# Patient Record
Sex: Male | Born: 1935 | Race: White | Hispanic: No | Marital: Married | Smoking: Former smoker
Health system: Southern US, Community
[De-identification: ages and names within clinical notes are randomized; demographics above are authoritative.]

## PROBLEM LIST (undated history)

## (undated) DIAGNOSIS — H9319 Tinnitus, unspecified ear: Secondary | ICD-10-CM

## (undated) DIAGNOSIS — H9192 Unspecified hearing loss, left ear: Secondary | ICD-10-CM

## (undated) DIAGNOSIS — I2510 Atherosclerotic heart disease of native coronary artery without angina pectoris: Secondary | ICD-10-CM

## (undated) DIAGNOSIS — I10 Essential (primary) hypertension: Secondary | ICD-10-CM

## (undated) DIAGNOSIS — I471 Supraventricular tachycardia, unspecified: Secondary | ICD-10-CM

## (undated) DIAGNOSIS — I4891 Unspecified atrial fibrillation: Secondary | ICD-10-CM

## (undated) DIAGNOSIS — T4145XA Adverse effect of unspecified anesthetic, initial encounter: Secondary | ICD-10-CM

## (undated) DIAGNOSIS — K219 Gastro-esophageal reflux disease without esophagitis: Secondary | ICD-10-CM

## (undated) DIAGNOSIS — N400 Enlarged prostate without lower urinary tract symptoms: Secondary | ICD-10-CM

## (undated) DIAGNOSIS — M1990 Unspecified osteoarthritis, unspecified site: Secondary | ICD-10-CM

## (undated) DIAGNOSIS — S3500XA Unspecified injury of abdominal aorta, initial encounter: Secondary | ICD-10-CM

## (undated) DIAGNOSIS — I350 Nonrheumatic aortic (valve) stenosis: Secondary | ICD-10-CM

## (undated) DIAGNOSIS — G4730 Sleep apnea, unspecified: Secondary | ICD-10-CM

## (undated) DIAGNOSIS — R9430 Abnormal result of cardiovascular function study, unspecified: Secondary | ICD-10-CM

## (undated) DIAGNOSIS — E785 Hyperlipidemia, unspecified: Secondary | ICD-10-CM

## (undated) DIAGNOSIS — N529 Male erectile dysfunction, unspecified: Secondary | ICD-10-CM

## (undated) DIAGNOSIS — Z8709 Personal history of other diseases of the respiratory system: Secondary | ICD-10-CM

## (undated) DIAGNOSIS — R233 Spontaneous ecchymoses: Secondary | ICD-10-CM

## (undated) DIAGNOSIS — Z8719 Personal history of other diseases of the digestive system: Secondary | ICD-10-CM

## (undated) DIAGNOSIS — Z8619 Personal history of other infectious and parasitic diseases: Secondary | ICD-10-CM

## (undated) DIAGNOSIS — N3281 Overactive bladder: Secondary | ICD-10-CM

## (undated) DIAGNOSIS — R2689 Other abnormalities of gait and mobility: Secondary | ICD-10-CM

## (undated) DIAGNOSIS — R238 Other skin changes: Secondary | ICD-10-CM

## (undated) DIAGNOSIS — Z77090 Contact with and (suspected) exposure to asbestos: Secondary | ICD-10-CM

## (undated) HISTORY — DX: Essential (primary) hypertension: I10

## (undated) HISTORY — DX: Supraventricular tachycardia, unspecified: I47.10

## (undated) HISTORY — DX: Male erectile dysfunction, unspecified: N52.9

## (undated) HISTORY — DX: Unspecified injury of abdominal aorta, initial encounter: S35.00XA

## (undated) HISTORY — DX: Nonrheumatic aortic (valve) stenosis: I35.0

## (undated) HISTORY — DX: Other skin changes: R23.8

## (undated) HISTORY — DX: Atherosclerotic heart disease of native coronary artery without angina pectoris: I25.10

## (undated) HISTORY — DX: Contact with and (suspected) exposure to asbestos: Z77.090

## (undated) HISTORY — DX: Abnormal result of cardiovascular function study, unspecified: R94.30

## (undated) HISTORY — DX: Unspecified atrial fibrillation: I48.91

## (undated) HISTORY — DX: Supraventricular tachycardia: I47.1

## (undated) HISTORY — PX: BRAIN SURGERY: SHX531

## (undated) HISTORY — DX: Gastro-esophageal reflux disease without esophagitis: K21.9

## (undated) HISTORY — DX: Unspecified hearing loss, left ear: H91.92

## (undated) HISTORY — DX: Spontaneous ecchymoses: R23.3

## (undated) HISTORY — DX: Hyperlipidemia, unspecified: E78.5

## (undated) HISTORY — DX: Overactive bladder: N32.81

## (undated) HISTORY — DX: Personal history of other diseases of the respiratory system: Z87.09

## (undated) HISTORY — DX: Unspecified osteoarthritis, unspecified site: M19.90

---

## 1993-12-13 HISTORY — PX: CRANIECTOMY FOR EXCISION OF ACOUSTIC NEUROMA: SUR324

## 1997-03-15 HISTORY — PX: CHOLECYSTECTOMY: SHX55

## 2000-11-27 ENCOUNTER — Ambulatory Visit (HOSPITAL_COMMUNITY): Admission: RE | Admit: 2000-11-27 | Discharge: 2000-11-27 | Payer: Self-pay | Admitting: Internal Medicine

## 2001-10-15 HISTORY — PX: TRIGGER FINGER RELEASE: SHX641

## 2002-01-08 ENCOUNTER — Ambulatory Visit (HOSPITAL_COMMUNITY): Admission: RE | Admit: 2002-01-08 | Discharge: 2002-01-08 | Payer: Self-pay | Admitting: Internal Medicine

## 2002-01-08 ENCOUNTER — Encounter: Payer: Self-pay | Admitting: Internal Medicine

## 2002-01-29 ENCOUNTER — Encounter: Payer: Self-pay | Admitting: Internal Medicine

## 2002-01-29 ENCOUNTER — Ambulatory Visit (HOSPITAL_COMMUNITY): Admission: RE | Admit: 2002-01-29 | Discharge: 2002-01-29 | Payer: Self-pay | Admitting: Internal Medicine

## 2002-04-28 ENCOUNTER — Ambulatory Visit (HOSPITAL_COMMUNITY): Admission: RE | Admit: 2002-04-28 | Discharge: 2002-04-28 | Payer: Self-pay | Admitting: Internal Medicine

## 2003-01-04 ENCOUNTER — Encounter: Admission: RE | Admit: 2003-01-04 | Discharge: 2003-01-04 | Payer: Self-pay | Admitting: Orthopedic Surgery

## 2003-01-04 ENCOUNTER — Encounter: Payer: Self-pay | Admitting: Orthopedic Surgery

## 2003-10-28 ENCOUNTER — Ambulatory Visit (HOSPITAL_COMMUNITY): Admission: RE | Admit: 2003-10-28 | Discharge: 2003-10-28 | Payer: Self-pay | Admitting: Internal Medicine

## 2004-10-20 ENCOUNTER — Ambulatory Visit: Payer: Self-pay | Admitting: Internal Medicine

## 2005-01-24 ENCOUNTER — Ambulatory Visit: Payer: Self-pay | Admitting: Internal Medicine

## 2005-04-30 ENCOUNTER — Ambulatory Visit: Payer: Self-pay | Admitting: Internal Medicine

## 2005-08-08 ENCOUNTER — Ambulatory Visit: Payer: Self-pay | Admitting: Internal Medicine

## 2005-11-06 ENCOUNTER — Ambulatory Visit: Payer: Self-pay | Admitting: Internal Medicine

## 2006-05-06 ENCOUNTER — Ambulatory Visit: Payer: Self-pay | Admitting: Internal Medicine

## 2006-11-06 ENCOUNTER — Ambulatory Visit: Payer: Self-pay | Admitting: Internal Medicine

## 2007-02-13 ENCOUNTER — Encounter (INDEPENDENT_AMBULATORY_CARE_PROVIDER_SITE_OTHER): Payer: Self-pay | Admitting: Internal Medicine

## 2007-03-25 ENCOUNTER — Encounter (INDEPENDENT_AMBULATORY_CARE_PROVIDER_SITE_OTHER): Payer: Self-pay | Admitting: Internal Medicine

## 2007-03-25 LAB — CONVERTED CEMR LAB: PSA: 3.05 ng/mL

## 2007-04-14 ENCOUNTER — Ambulatory Visit: Payer: Self-pay | Admitting: Internal Medicine

## 2007-04-14 DIAGNOSIS — M25569 Pain in unspecified knee: Secondary | ICD-10-CM

## 2007-04-14 DIAGNOSIS — M129 Arthropathy, unspecified: Secondary | ICD-10-CM | POA: Insufficient documentation

## 2007-04-14 DIAGNOSIS — K219 Gastro-esophageal reflux disease without esophagitis: Secondary | ICD-10-CM

## 2007-04-14 DIAGNOSIS — N318 Other neuromuscular dysfunction of bladder: Secondary | ICD-10-CM | POA: Insufficient documentation

## 2007-05-05 ENCOUNTER — Ambulatory Visit: Payer: Self-pay | Admitting: Internal Medicine

## 2007-05-05 ENCOUNTER — Telehealth (INDEPENDENT_AMBULATORY_CARE_PROVIDER_SITE_OTHER): Payer: Self-pay | Admitting: Internal Medicine

## 2007-05-05 LAB — CONVERTED CEMR LAB
BUN: 10 mg/dL (ref 6–23)
Calcium: 8.9 mg/dL (ref 8.4–10.5)
Eosinophils Absolute: 0.2 10*3/uL (ref 0.0–0.6)
GFR calc Af Amer: 123 mL/min
GFR calc non Af Amer: 102 mL/min
Lymphocytes Relative: 27.4 % (ref 12.0–46.0)
MCV: 87 fL (ref 78.0–100.0)
Monocytes Relative: 10.4 % (ref 3.0–11.0)
Neutro Abs: 5.1 10*3/uL (ref 1.4–7.7)
Platelets: 462 10*3/uL — ABNORMAL HIGH (ref 150–400)

## 2007-05-14 ENCOUNTER — Telehealth (INDEPENDENT_AMBULATORY_CARE_PROVIDER_SITE_OTHER): Payer: Self-pay | Admitting: Internal Medicine

## 2007-05-14 ENCOUNTER — Ambulatory Visit: Payer: Self-pay | Admitting: Internal Medicine

## 2007-05-14 ENCOUNTER — Ambulatory Visit (HOSPITAL_COMMUNITY): Admission: RE | Admit: 2007-05-14 | Discharge: 2007-05-14 | Payer: Self-pay | Admitting: Internal Medicine

## 2007-05-15 ENCOUNTER — Telehealth (INDEPENDENT_AMBULATORY_CARE_PROVIDER_SITE_OTHER): Payer: Self-pay | Admitting: *Deleted

## 2007-05-19 ENCOUNTER — Encounter (INDEPENDENT_AMBULATORY_CARE_PROVIDER_SITE_OTHER): Payer: Self-pay | Admitting: Internal Medicine

## 2007-08-04 ENCOUNTER — Ambulatory Visit: Payer: Self-pay | Admitting: Internal Medicine

## 2007-08-04 ENCOUNTER — Telehealth (INDEPENDENT_AMBULATORY_CARE_PROVIDER_SITE_OTHER): Payer: Self-pay | Admitting: Internal Medicine

## 2007-08-05 ENCOUNTER — Telehealth (INDEPENDENT_AMBULATORY_CARE_PROVIDER_SITE_OTHER): Payer: Self-pay | Admitting: *Deleted

## 2007-08-05 LAB — CONVERTED CEMR LAB
ALT: 34 units/L (ref 0–53)
AST: 28 units/L (ref 0–37)
Albumin: 4.3 g/dL (ref 3.5–5.2)
Calcium: 9.4 mg/dL (ref 8.4–10.5)
Chloride: 97 meq/L (ref 96–112)
Potassium: 4.2 meq/L (ref 3.5–5.3)
Sodium: 135 meq/L (ref 135–145)
Total CHOL/HDL Ratio: 6.3

## 2007-08-06 ENCOUNTER — Telehealth (INDEPENDENT_AMBULATORY_CARE_PROVIDER_SITE_OTHER): Payer: Self-pay | Admitting: Internal Medicine

## 2007-10-07 ENCOUNTER — Ambulatory Visit: Payer: Self-pay | Admitting: Cardiology

## 2007-10-07 ENCOUNTER — Encounter (INDEPENDENT_AMBULATORY_CARE_PROVIDER_SITE_OTHER): Payer: Self-pay | Admitting: Internal Medicine

## 2007-10-07 ENCOUNTER — Encounter: Payer: Self-pay | Admitting: Internal Medicine

## 2007-10-08 ENCOUNTER — Encounter (INDEPENDENT_AMBULATORY_CARE_PROVIDER_SITE_OTHER): Payer: Self-pay | Admitting: Internal Medicine

## 2007-10-20 ENCOUNTER — Ambulatory Visit: Payer: Self-pay | Admitting: Internal Medicine

## 2007-10-21 ENCOUNTER — Encounter (INDEPENDENT_AMBULATORY_CARE_PROVIDER_SITE_OTHER): Payer: Self-pay | Admitting: Internal Medicine

## 2007-10-22 ENCOUNTER — Telehealth (INDEPENDENT_AMBULATORY_CARE_PROVIDER_SITE_OTHER): Payer: Self-pay | Admitting: *Deleted

## 2007-10-22 LAB — CONVERTED CEMR LAB: Potassium: 4.4 meq/L (ref 3.5–5.3)

## 2007-10-24 ENCOUNTER — Encounter: Payer: Self-pay | Admitting: Cardiology

## 2007-10-31 ENCOUNTER — Telehealth (INDEPENDENT_AMBULATORY_CARE_PROVIDER_SITE_OTHER): Payer: Self-pay | Admitting: *Deleted

## 2007-11-03 ENCOUNTER — Ambulatory Visit: Payer: Self-pay | Admitting: Internal Medicine

## 2007-11-03 DIAGNOSIS — J4520 Mild intermittent asthma, uncomplicated: Secondary | ICD-10-CM | POA: Insufficient documentation

## 2007-11-05 ENCOUNTER — Ambulatory Visit: Payer: Self-pay | Admitting: Cardiology

## 2007-11-07 ENCOUNTER — Encounter: Payer: Self-pay | Admitting: Physician Assistant

## 2007-11-07 ENCOUNTER — Ambulatory Visit: Payer: Self-pay | Admitting: Cardiology

## 2007-11-18 ENCOUNTER — Ambulatory Visit: Payer: Self-pay | Admitting: Internal Medicine

## 2007-11-18 ENCOUNTER — Telehealth (INDEPENDENT_AMBULATORY_CARE_PROVIDER_SITE_OTHER): Payer: Self-pay | Admitting: *Deleted

## 2007-11-18 DIAGNOSIS — L259 Unspecified contact dermatitis, unspecified cause: Secondary | ICD-10-CM | POA: Insufficient documentation

## 2007-11-19 ENCOUNTER — Telehealth (INDEPENDENT_AMBULATORY_CARE_PROVIDER_SITE_OTHER): Payer: Self-pay | Admitting: Internal Medicine

## 2007-11-20 ENCOUNTER — Ambulatory Visit: Payer: Self-pay | Admitting: Internal Medicine

## 2007-11-28 ENCOUNTER — Ambulatory Visit: Payer: Self-pay | Admitting: Internal Medicine

## 2007-12-16 ENCOUNTER — Ambulatory Visit: Payer: Self-pay | Admitting: Internal Medicine

## 2007-12-17 ENCOUNTER — Telehealth (INDEPENDENT_AMBULATORY_CARE_PROVIDER_SITE_OTHER): Payer: Self-pay | Admitting: *Deleted

## 2007-12-17 LAB — CONVERTED CEMR LAB
CO2: 23 meq/L (ref 19–32)
Calcium: 9.2 mg/dL (ref 8.4–10.5)
Chloride: 104 meq/L (ref 96–112)
Eosinophils Relative: 3 % (ref 0–5)
Glucose, Bld: 103 mg/dL — ABNORMAL HIGH (ref 70–99)
HCT: 43 % (ref 39.0–52.0)
Hemoglobin: 13.9 g/dL (ref 13.0–17.0)
Lymphocytes Relative: 25 % (ref 12–46)
Lymphs Abs: 1.8 10*3/uL (ref 0.7–4.0)
Platelets: 346 10*3/uL (ref 150–400)
Potassium: 4.1 meq/L (ref 3.5–5.3)
Sodium: 139 meq/L (ref 135–145)
WBC: 7.2 10*3/uL (ref 4.0–10.5)

## 2007-12-30 ENCOUNTER — Ambulatory Visit: Payer: Self-pay | Admitting: Cardiology

## 2008-01-15 ENCOUNTER — Telehealth (INDEPENDENT_AMBULATORY_CARE_PROVIDER_SITE_OTHER): Payer: Self-pay | Admitting: *Deleted

## 2008-01-15 ENCOUNTER — Ambulatory Visit: Payer: Self-pay | Admitting: Internal Medicine

## 2008-01-15 DIAGNOSIS — J302 Other seasonal allergic rhinitis: Secondary | ICD-10-CM

## 2008-01-15 DIAGNOSIS — J3089 Other allergic rhinitis: Secondary | ICD-10-CM

## 2008-01-28 ENCOUNTER — Encounter (INDEPENDENT_AMBULATORY_CARE_PROVIDER_SITE_OTHER): Payer: Self-pay | Admitting: Internal Medicine

## 2008-02-10 ENCOUNTER — Ambulatory Visit: Payer: Self-pay | Admitting: Cardiology

## 2008-02-12 ENCOUNTER — Ambulatory Visit: Payer: Self-pay | Admitting: Internal Medicine

## 2008-02-13 ENCOUNTER — Encounter (INDEPENDENT_AMBULATORY_CARE_PROVIDER_SITE_OTHER): Payer: Self-pay | Admitting: Internal Medicine

## 2008-02-16 ENCOUNTER — Encounter (INDEPENDENT_AMBULATORY_CARE_PROVIDER_SITE_OTHER): Payer: Self-pay | Admitting: Internal Medicine

## 2008-02-21 ENCOUNTER — Encounter (INDEPENDENT_AMBULATORY_CARE_PROVIDER_SITE_OTHER): Payer: Self-pay | Admitting: Internal Medicine

## 2008-02-23 ENCOUNTER — Telehealth (INDEPENDENT_AMBULATORY_CARE_PROVIDER_SITE_OTHER): Payer: Self-pay | Admitting: Internal Medicine

## 2008-02-24 ENCOUNTER — Encounter (INDEPENDENT_AMBULATORY_CARE_PROVIDER_SITE_OTHER): Payer: Self-pay | Admitting: Internal Medicine

## 2008-02-24 ENCOUNTER — Ambulatory Visit (HOSPITAL_COMMUNITY): Payer: Self-pay | Admitting: Psychology

## 2008-02-24 ENCOUNTER — Telehealth (INDEPENDENT_AMBULATORY_CARE_PROVIDER_SITE_OTHER): Payer: Self-pay | Admitting: *Deleted

## 2008-03-02 ENCOUNTER — Encounter: Payer: Self-pay | Admitting: Internal Medicine

## 2008-03-02 ENCOUNTER — Ambulatory Visit: Admission: RE | Admit: 2008-03-02 | Discharge: 2008-03-02 | Payer: Self-pay | Admitting: Emergency Medicine

## 2008-03-04 ENCOUNTER — Ambulatory Visit: Payer: Self-pay | Admitting: Pulmonary Disease

## 2008-03-12 ENCOUNTER — Ambulatory Visit (HOSPITAL_COMMUNITY): Payer: Self-pay | Admitting: Psychology

## 2008-03-18 ENCOUNTER — Ambulatory Visit: Payer: Self-pay | Admitting: Cardiology

## 2008-03-18 ENCOUNTER — Encounter (INDEPENDENT_AMBULATORY_CARE_PROVIDER_SITE_OTHER): Payer: Self-pay | Admitting: Internal Medicine

## 2008-03-25 ENCOUNTER — Ambulatory Visit: Payer: Self-pay | Admitting: Internal Medicine

## 2008-03-25 DIAGNOSIS — G4733 Obstructive sleep apnea (adult) (pediatric): Secondary | ICD-10-CM

## 2008-03-30 ENCOUNTER — Ambulatory Visit: Payer: Self-pay | Admitting: Internal Medicine

## 2008-04-05 ENCOUNTER — Encounter (INDEPENDENT_AMBULATORY_CARE_PROVIDER_SITE_OTHER): Payer: Self-pay | Admitting: Internal Medicine

## 2008-04-07 ENCOUNTER — Encounter (INDEPENDENT_AMBULATORY_CARE_PROVIDER_SITE_OTHER): Payer: Self-pay | Admitting: Internal Medicine

## 2008-04-14 ENCOUNTER — Encounter (INDEPENDENT_AMBULATORY_CARE_PROVIDER_SITE_OTHER): Payer: Self-pay | Admitting: Internal Medicine

## 2008-04-15 ENCOUNTER — Telehealth (INDEPENDENT_AMBULATORY_CARE_PROVIDER_SITE_OTHER): Payer: Self-pay | Admitting: *Deleted

## 2008-04-20 ENCOUNTER — Encounter (INDEPENDENT_AMBULATORY_CARE_PROVIDER_SITE_OTHER): Payer: Self-pay | Admitting: *Deleted

## 2008-04-29 ENCOUNTER — Ambulatory Visit: Payer: Self-pay | Admitting: Internal Medicine

## 2008-05-11 ENCOUNTER — Encounter (INDEPENDENT_AMBULATORY_CARE_PROVIDER_SITE_OTHER): Payer: Self-pay | Admitting: Internal Medicine

## 2008-05-13 ENCOUNTER — Encounter (INDEPENDENT_AMBULATORY_CARE_PROVIDER_SITE_OTHER): Payer: Self-pay | Admitting: Internal Medicine

## 2008-05-17 ENCOUNTER — Telehealth (INDEPENDENT_AMBULATORY_CARE_PROVIDER_SITE_OTHER): Payer: Self-pay | Admitting: *Deleted

## 2008-06-01 ENCOUNTER — Encounter (INDEPENDENT_AMBULATORY_CARE_PROVIDER_SITE_OTHER): Payer: Self-pay | Admitting: Internal Medicine

## 2008-07-29 ENCOUNTER — Telehealth (INDEPENDENT_AMBULATORY_CARE_PROVIDER_SITE_OTHER): Payer: Self-pay | Admitting: Internal Medicine

## 2008-07-29 ENCOUNTER — Ambulatory Visit: Payer: Self-pay | Admitting: Internal Medicine

## 2008-08-23 ENCOUNTER — Ambulatory Visit: Payer: Self-pay | Admitting: Cardiology

## 2008-09-17 ENCOUNTER — Telehealth (INDEPENDENT_AMBULATORY_CARE_PROVIDER_SITE_OTHER): Payer: Self-pay | Admitting: *Deleted

## 2008-09-17 ENCOUNTER — Ambulatory Visit: Payer: Self-pay | Admitting: Internal Medicine

## 2008-10-27 ENCOUNTER — Ambulatory Visit: Payer: Self-pay | Admitting: Internal Medicine

## 2008-10-27 LAB — CONVERTED CEMR LAB
Bilirubin Urine: NEGATIVE
Blood in Urine, dipstick: NEGATIVE
Glucose, Urine, Semiquant: NEGATIVE
Ketones, urine, test strip: NEGATIVE
Protein, U semiquant: NEGATIVE
pH: 8.5

## 2008-11-16 ENCOUNTER — Encounter (INDEPENDENT_AMBULATORY_CARE_PROVIDER_SITE_OTHER): Payer: Self-pay | Admitting: Internal Medicine

## 2009-01-03 ENCOUNTER — Ambulatory Visit: Payer: Self-pay | Admitting: Internal Medicine

## 2009-01-06 ENCOUNTER — Ambulatory Visit: Payer: Self-pay | Admitting: Internal Medicine

## 2009-01-06 LAB — CONVERTED CEMR LAB: PSA: 3.6 ng/mL (ref 0.10–4.00)

## 2009-01-07 LAB — CONVERTED CEMR LAB
ALT: 16 units/L (ref 0–53)
AST: 20 units/L (ref 0–37)
CO2: 23 meq/L (ref 19–32)
Calcium: 8.9 mg/dL (ref 8.4–10.5)
Chloride: 102 meq/L (ref 96–112)
Cholesterol: 182 mg/dL (ref 0–200)
Potassium: 4.3 meq/L (ref 3.5–5.3)
Sodium: 139 meq/L (ref 135–145)
Total Protein: 7.1 g/dL (ref 6.0–8.3)

## 2009-01-11 ENCOUNTER — Ambulatory Visit (HOSPITAL_COMMUNITY): Admission: RE | Admit: 2009-01-11 | Discharge: 2009-01-11 | Payer: Self-pay | Admitting: Internal Medicine

## 2009-01-11 ENCOUNTER — Encounter (INDEPENDENT_AMBULATORY_CARE_PROVIDER_SITE_OTHER): Payer: Self-pay | Admitting: Internal Medicine

## 2009-01-13 ENCOUNTER — Ambulatory Visit: Payer: Self-pay | Admitting: Cardiology

## 2009-01-18 ENCOUNTER — Telehealth (INDEPENDENT_AMBULATORY_CARE_PROVIDER_SITE_OTHER): Payer: Self-pay | Admitting: Internal Medicine

## 2009-01-25 ENCOUNTER — Ambulatory Visit: Payer: Self-pay | Admitting: Internal Medicine

## 2009-02-01 ENCOUNTER — Telehealth: Payer: Self-pay | Admitting: Internal Medicine

## 2009-04-22 ENCOUNTER — Ambulatory Visit: Payer: Self-pay | Admitting: Internal Medicine

## 2009-04-22 DIAGNOSIS — Q767 Congenital malformation of sternum: Secondary | ICD-10-CM

## 2009-04-22 DIAGNOSIS — Q766 Other congenital malformations of ribs: Secondary | ICD-10-CM

## 2009-04-26 ENCOUNTER — Encounter (INDEPENDENT_AMBULATORY_CARE_PROVIDER_SITE_OTHER): Payer: Self-pay | Admitting: Internal Medicine

## 2009-04-27 LAB — CONVERTED CEMR LAB
CO2: 25 meq/L (ref 19–32)
Glucose, Bld: 84 mg/dL (ref 70–99)
Potassium: 4.3 meq/L (ref 3.5–5.3)
Sodium: 135 meq/L (ref 135–145)

## 2009-06-22 ENCOUNTER — Ambulatory Visit: Payer: Self-pay | Admitting: Internal Medicine

## 2009-06-22 DIAGNOSIS — R141 Gas pain: Secondary | ICD-10-CM | POA: Insufficient documentation

## 2009-06-22 DIAGNOSIS — M549 Dorsalgia, unspecified: Secondary | ICD-10-CM | POA: Insufficient documentation

## 2009-06-22 DIAGNOSIS — R142 Eructation: Secondary | ICD-10-CM

## 2009-06-22 DIAGNOSIS — R143 Flatulence: Secondary | ICD-10-CM

## 2009-06-23 ENCOUNTER — Encounter: Payer: Self-pay | Admitting: Internal Medicine

## 2009-06-28 ENCOUNTER — Telehealth: Payer: Self-pay | Admitting: Internal Medicine

## 2009-06-29 ENCOUNTER — Telehealth (INDEPENDENT_AMBULATORY_CARE_PROVIDER_SITE_OTHER): Payer: Self-pay | Admitting: *Deleted

## 2009-06-30 ENCOUNTER — Encounter (INDEPENDENT_AMBULATORY_CARE_PROVIDER_SITE_OTHER): Payer: Self-pay | Admitting: Internal Medicine

## 2009-07-04 ENCOUNTER — Encounter (INDEPENDENT_AMBULATORY_CARE_PROVIDER_SITE_OTHER): Payer: Self-pay | Admitting: Internal Medicine

## 2009-07-05 ENCOUNTER — Telehealth (INDEPENDENT_AMBULATORY_CARE_PROVIDER_SITE_OTHER): Payer: Self-pay | Admitting: Internal Medicine

## 2009-07-14 ENCOUNTER — Encounter: Payer: Self-pay | Admitting: Cardiology

## 2009-07-15 ENCOUNTER — Ambulatory Visit: Payer: Self-pay | Admitting: Cardiology

## 2009-07-15 DIAGNOSIS — J449 Chronic obstructive pulmonary disease, unspecified: Secondary | ICD-10-CM

## 2009-07-15 DIAGNOSIS — Z87891 Personal history of nicotine dependence: Secondary | ICD-10-CM

## 2009-07-15 DIAGNOSIS — J4489 Other specified chronic obstructive pulmonary disease: Secondary | ICD-10-CM | POA: Insufficient documentation

## 2009-07-26 ENCOUNTER — Ambulatory Visit: Payer: Self-pay | Admitting: Internal Medicine

## 2009-09-14 DIAGNOSIS — R55 Syncope and collapse: Secondary | ICD-10-CM | POA: Insufficient documentation

## 2009-09-14 HISTORY — PX: COLONOSCOPY: SHX174

## 2009-10-14 ENCOUNTER — Encounter: Payer: Self-pay | Admitting: Cardiology

## 2009-10-16 ENCOUNTER — Encounter: Payer: Self-pay | Admitting: Cardiology

## 2009-10-17 ENCOUNTER — Encounter: Payer: Self-pay | Admitting: Cardiology

## 2009-11-18 ENCOUNTER — Telehealth (INDEPENDENT_AMBULATORY_CARE_PROVIDER_SITE_OTHER): Payer: Self-pay | Admitting: *Deleted

## 2009-11-21 ENCOUNTER — Encounter: Payer: Self-pay | Admitting: Cardiology

## 2009-12-01 ENCOUNTER — Telehealth (INDEPENDENT_AMBULATORY_CARE_PROVIDER_SITE_OTHER): Payer: Self-pay | Admitting: *Deleted

## 2009-12-07 ENCOUNTER — Encounter: Payer: Self-pay | Admitting: Cardiology

## 2009-12-08 ENCOUNTER — Ambulatory Visit: Payer: Self-pay | Admitting: Cardiology

## 2009-12-29 ENCOUNTER — Ambulatory Visit: Payer: Self-pay

## 2009-12-29 ENCOUNTER — Encounter: Payer: Self-pay | Admitting: Cardiology

## 2009-12-29 DIAGNOSIS — I10 Essential (primary) hypertension: Secondary | ICD-10-CM | POA: Insufficient documentation

## 2010-01-02 ENCOUNTER — Encounter: Payer: Self-pay | Admitting: Cardiology

## 2010-01-04 ENCOUNTER — Ambulatory Visit: Payer: Self-pay | Admitting: Cardiology

## 2010-01-05 ENCOUNTER — Encounter: Payer: Self-pay | Admitting: Cardiology

## 2010-01-10 ENCOUNTER — Telehealth (INDEPENDENT_AMBULATORY_CARE_PROVIDER_SITE_OTHER): Payer: Self-pay | Admitting: *Deleted

## 2010-01-10 ENCOUNTER — Telehealth: Payer: Self-pay | Admitting: Internal Medicine

## 2010-01-12 ENCOUNTER — Telehealth: Payer: Self-pay | Admitting: Internal Medicine

## 2010-03-08 ENCOUNTER — Ambulatory Visit: Payer: Self-pay | Admitting: Cardiology

## 2010-04-28 ENCOUNTER — Encounter: Payer: Self-pay | Admitting: Cardiology

## 2010-05-02 ENCOUNTER — Ambulatory Visit: Payer: Self-pay | Admitting: Cardiology

## 2010-06-15 ENCOUNTER — Encounter: Payer: Self-pay | Admitting: Cardiology

## 2010-06-22 ENCOUNTER — Ambulatory Visit: Payer: Self-pay | Admitting: Cardiology

## 2010-06-22 ENCOUNTER — Telehealth (INDEPENDENT_AMBULATORY_CARE_PROVIDER_SITE_OTHER): Payer: Self-pay | Admitting: *Deleted

## 2010-07-25 ENCOUNTER — Ambulatory Visit: Payer: Self-pay | Admitting: Internal Medicine

## 2010-08-14 ENCOUNTER — Ambulatory Visit: Payer: Self-pay | Admitting: Cardiology

## 2010-08-23 ENCOUNTER — Ambulatory Visit: Payer: Self-pay | Admitting: Cardiology

## 2010-08-29 ENCOUNTER — Telehealth: Payer: Self-pay | Admitting: Cardiology

## 2010-09-11 ENCOUNTER — Ambulatory Visit: Payer: Self-pay | Admitting: Cardiology

## 2010-10-03 ENCOUNTER — Telehealth (INDEPENDENT_AMBULATORY_CARE_PROVIDER_SITE_OTHER): Payer: Self-pay | Admitting: *Deleted

## 2010-10-17 ENCOUNTER — Encounter: Payer: Self-pay | Admitting: Cardiology

## 2010-11-16 NOTE — Assessment & Plan Note (Signed)
Summary: per Dr. Donell Beers see patient  Medications Added QUINAPRIL HCL 40 MG  TABS (QUINAPRIL HCL) Take 1 tablet by mouth two times a day DILTIAZEM HCL ER BEADS 120 MG XR24H-CAP (DILTIAZEM HCL ER BEADS) Take 1 tablet by mouth two times a day FLOMAX 0.4 MG CAPS (TAMSULOSIN HCL) Take 1 tab by mouth at bedtime * VITAMIN A 8000IU 2 pills daily COZAAR 50 MG TABS (LOSARTAN POTASSIUM) Take 1 tablet by mouth once a day        Visit Type:  Follow-up Referring Provider:  Fannie Knee, MD Primary Provider:  Dr. Velva Harman  CC:  hypertension.  History of Present Illness: Patient is seen for followup of hypertension.  I have spoken over time with Dr.[name] about the patient and his medications.  His heart rate runs approximately 50.  His systolic is in the range of 175 and diastolic in the range of 75.  Patient is completely fixated on his blood pressure.  He exercises regularly.  He has had multiple problems taking several blood pressure medicines.  By history he has had trouble with thiazides, Norvasc (swelling), doxazosin.  I have spoken to Dr. Leandrew Koyanagi about clonidine.  The patient shows me samples of 0.2 mg clonidine.  He says that he did have dry mouth with this.  I do not know if 1 mg was tried.  Patient currently is on quinapril and diltiazem b.i.d.  He was tried on minoxidil and had marked swelling.  I do not know of high-dose diuretics were used at that time.  The patient was on metoprolol in the past.  Unfortunately his heart rate is too slow to try this again.  We can consider pindolol.  First however we will try an ARB in combination with his ACE inhibitor.  Patient needs to be reassured.  He has sleep apnea but does not tolerate CPAP.  The sleep apnea may be driving our inability to treat his blood pressure.  He does not sleep well in general.  He does use some medications for this.  Preventive Screening-Counseling & Management  Alcohol-Tobacco     Smoking Status: quit     Year  Started: 30 years     Year Quit: 1986  Current Medications (verified): 1)  Aleve   Tabs (Naproxen Sodium Tabs) .... As Needed 2)  Tylenol   Tabs (Acetaminophen Tabs) .... As Needed 3)  Pravastatin Sodium 20 Mg  Tabs (Pravastatin Sodium) .Marland Kitchen.. 1 By Mouth Once Daily 4)  Bayer Aspirin Ec Low Dose 81 Mg  Tbec (Aspirin) .... Take 1 By Mouth Once Daily 5)  Quinapril Hcl 40 Mg  Tabs (Quinapril Hcl) .... Take 1 Tablet By Mouth Two Times A Day 6)  Fexofenadine Hcl 180 Mg Tabs (Fexofenadine Hcl) .... As Needed 7)  Centrum Silver   Tabs (Multiple Vitamins-Minerals) .... Take 1 Tablet By Mouth Once A Day 8)  Lorazepam 1 Mg Tabs (Lorazepam) .... Take 1 By Mouth At Bedtime As Needed Sleep 9)  Calcium 600 600 Mg Tabs (Calcium Carbonate) .... Take 1 By Mouth Once Daily 10)  Prilosec Otc 20 Mg Tbec (Omeprazole Magnesium) .Marland Kitchen.. 1 By Mouth Once Daily 11)  Diltiazem Hcl Er Beads 120 Mg Xr24h-Cap (Diltiazem Hcl Er Beads) .... Take 1 Tablet By Mouth Two Times A Day 12)  Flomax 0.4 Mg Caps (Tamsulosin Hcl) .... Take 1 Tab By Mouth At Bedtime 13)  Vitamin A 8000iu .... 2 Pills Daily  Allergies: 1)  Codeine  Past History:  Past Medical  History: Last updated: 12/07/2009 Anxiety Depression GERD Hyperlipidemia Hypertension asbestos exposure, hx--asbestos-related pleural plaques overactive bladder Erectile dysfunction arthritis Hearing loss in L ear Baker's Cyst L leg SVT.... AVNRT... AV nodal reentry tachycardia Allergic rhinitis Obstructive Sleep Apnea--moderate -NPSG 03/02/08 AHI 23 cpap 8.. Cardiolite... nonischemic January 2009 EF 60-65%...echo..09/2007 RV  mild dilitation and dysfunction....echo...09/2007  Review of Systems       Patient denies fever, chills, headache, sweats, rash, change in vision, change in hearing, chest pain, cough, shortness of breath, nausea or vomiting, urinary symptoms.  All other systems are reviewed and are negative.  Vital Signs:  Patient profile:   76 year old  male Height:      69 inches Weight:      216.75 pounds O2 Sat:      98 % on Room air Pulse rate:   53 / minute BP sitting:   172 / 81  (left arm) Cuff size:   regular  Vitals Entered By: Hoover Brunette, LPN (December 08, 2009 1:07 PM)  O2 Flow:  Room air CC: hypertension Is Patient Diabetic? No   Physical Exam  General:  patient is stable in general. Head:  head is atraumatic. Eyes:  no xanthelasma. Neck:  no jugular venous distention. Chest Wall:  no chest wall tenderness. Lungs:  lungs are clear.  Respiratory effort is nonlabored. Heart:  cardiac exam reveals S1 and S2.  There is a soft systolic murmur. Abdomen:  abdomen is soft. Msk:  no musculoskeletal deformities. Extremities:  no peripheral edema. Skin:  no skin rashes. Psych:  patient is oriented to person time and place.  He is very anxious about his blood pressure.   Impression & Recommendations:  Problem # 1:  COPD (ICD-496) There is a history of COPD but this does not appear to be a significant problem at this time.  Problem # 2:  SLEEP APNEA (ICD-780.57) Sleep apnea is a real issue.  He would be better if he could tolerate CPAP.  I don't know if this can be given another trial or not.  Problem # 3:  * SVT The patient is not having any recurrent episodes of supraventricular tachycardia.  No change in therapy.  Problem # 4:  BRADYCARDIA (ICD-427.89)  His updated medication list for this problem includes:    Bayer Aspirin Ec Low Dose 81 Mg Tbec (Aspirin) .Marland Kitchen... Take 1 by mouth once daily    Quinapril Hcl 40 Mg Tabs (Quinapril hcl) .Marland Kitchen... Take 1 tablet by mouth two times a day    Diltiazem Hcl Er Beads 120 Mg Xr24h-cap (Diltiazem hcl er beads) .Marland Kitchen... Take 1 tablet by mouth two times a day The patient does have resting bradycardia.  His limits our choice of medicines.  We may use atenolol at some point.  Problem # 5:  HYPERTENSION (ICD-401.9)  The following medications were removed from the medication list:     Maxzide-25 37.5-25 Mg Tabs (Triamterene-hctz) .Marland Kitchen... 1 by mouth once daily    Doxazosin Mesylate 8 Mg Tabs (Doxazosin mesylate) .Marland Kitchen... Take 1 tab by mouth at bedtime His updated medication list for this problem includes:    Bayer Aspirin Ec Low Dose 81 Mg Tbec (Aspirin) .Marland Kitchen... Take 1 by mouth once daily    Quinapril Hcl 40 Mg Tabs (Quinapril hcl) .Marland Kitchen... Take 1 tablet by mouth two times a day    Diltiazem Hcl Er Beads 120 Mg Xr24h-cap (Diltiazem hcl er beads) .Marland Kitchen... Take 1 tablet by mouth two times a day  Cozaar 50 Mg Tabs (Losartan potassium) .Marland Kitchen... Take 1 tablet by mouth once a day I am not sure we are going to be able to solve this problem his hypertension.  There is clearly an anxiety component related to it.  We need to be sure that there is not a mechanical basis and therefore renal artery Dopplers will be done.  Many of the therapeutic options have been limited by the patient himself.  We will add Cozaar 50 mg daily to his current medications.  Renal Dopplers will be obtained.  I will consider pindolol at the next visit.  I will consider whether a dose of 0.1 mg of clonidine at night only to be considered.  We may have to look into ambulatory blood pressure monitoring to see what his blood pressure is when he sleeping at night.  Orders: Renal Artery Duplex (Renal Artery Duplex) T-Basic Metabolic Panel ([id number])  Patient Instructions: 1)  Start Cozaar 50mg  by mouth once daily. 2)  Your physician has requested that you have a renal artery duplex. During this test, an ultrasound is used to evaluate blood flow to the kidneys. Allow one hour for this exam. Do not eat after midnight the day before and avoid carbonated beverages. Take your medications as you usually do. This will be done in our [place] office. 3)  Your physician recommends that you go to the [facility] for lab work. 4)  Your physician recommends that you schedule a follow-up appointment in: about 4  weeks. Prescriptions: COZAAR 50 MG TABS (LOSARTAN POTASSIUM) Take 1 tablet by mouth once a day  #30 x 6   Entered by:   Cyril Loosen, RN, BSN   Authorized by:   Talitha Givens, MD, [facility]   Signed by:   Cyril Loosen, RN, BSN on 12/08/2009   Method used:   Electronically to        [facility] (retail)       [address]       [place], [place]  [address]       Ph: [phone]       Fax: [phone]   RxID:   [id number]   Handout requested.    Appended Document: per Dr. Donell Beers see patient Looks good. I will discuss with him on 3/23.  Appended Document: per Dr. Donell Beers see patient Patient informed of the above.

## 2010-11-16 NOTE — Progress Notes (Signed)
Summary: Office Visit/ BLOOD PRESSURE READINGS  Office Visit/ BLOOD PRESSURE READINGS   Imported By: Dorise Hiss 01/05/2010 15:42:12  _____________________________________________________________________  External Attachment:    Type:   Image     Comment:   External Document

## 2010-11-16 NOTE — Assessment & Plan Note (Signed)
Summary: 2 MO F/U PER 9/8 OV-JM  Medications Added CLONIDINE HCL 0.2 MG TABS (CLONIDINE HCL) Take one tablet by mouth two times a day CLONIDINE HCL 0.2 MG TABS (CLONIDINE HCL) Take 1/2 tablet by mouth two times a day CHLORTHALIDONE 25 MG TABS (CHLORTHALIDONE) Take one tablet by mouth daily      Allergies Added:   Visit Type:  Follow-up   Preventive Screening-Counseling & Management  Alcohol-Tobacco     Smoking Status: quit     Year Quit: 1980  Current Medications (verified): 1)  Aleve   Tabs (Naproxen Sodium Tabs) .... As Needed 2)  Tylenol   Tabs (Acetaminophen Tabs) .... As Needed 3)  Pravastatin Sodium 20 Mg  Tabs (Pravastatin Sodium) .Marland Kitchen.. 1 By Mouth Once Daily At Night 4)  Bayer Aspirin Ec Low Dose 81 Mg  Tbec (Aspirin) .... Take 1 By Mouth Once Daily in Morning 5)  Quinapril Hcl 40 Mg  Tabs (Quinapril Hcl) .... Take 1 Tablet By Mouth in Morning and One By Mouth At Night 6)  Fexofenadine Hcl 180 Mg Tabs (Fexofenadine Hcl) .... As Needed(Anytime) 7)  Centrum Silver   Tabs (Multiple Vitamins-Minerals) .... Take 1 Tablet By Mouth Once A Day(Anytime) 8)  Lorazepam 1 Mg Tabs (Lorazepam) .... Take 1 By Mouth At Bedtime As Needed Sleep At Night 9)  Calcium 600 600 Mg Tabs (Calcium Carbonate) .... Take 1 By Mouth Once Daily(Anytime) 10)  Prilosec Otc 20 Mg Tbec (Omeprazole Magnesium) .Marland Kitchen.. 1 By Mouth Once Daily Thirty Minutes Before Meal 11)  Diltiazem Hcl Er Beads 120 Mg Xr24h-Cap (Diltiazem Hcl Er Beads) .... Take 1 Tablet By Mouth in Morning and 1 By Mouth At Night 12)  Vitamin A 8000iu .... 2 Pills Daily(Anytime) 13)  Cozaar 50 Mg Tabs (Losartan Potassium) .... Take 1 Tablet By Mouth Once A Day in Morning 14)  Clonidine Hcl 0.2 Mg Tabs (Clonidine Hcl) .... Take One Tablet By Mouth Two Times A Day 15)  Viagra 50 Mg Tabs (Sildenafil Citrate) .... Take 1/2-1 Tablet By Mouth As Needed As Directed.  Allergies (verified): 1)  Codeine  Comments:  Nurse/Medical Assistant: The  patient's medication bottles & list and allergies were reviewed with the patient and were updated in the Medication and Allergy Lists.  Past History:  Past Medical History: Last updated: 06/22/2010 Anxiety Depression GERD Hyperlipidemia Hypertension...renal artery Doppler... December 29, 2009.... normal Abdominal aorta.... normal size.... ultrasound... December 29, 2009 asbestos exposure, hx--asbestos-related pleural plaques overactive bladder Erectile dysfunction arthritis Hearing loss in L ear Baker's Cyst L leg SVT.... AVNRT... AV nodal reentry tachycardia Allergic rhinitis Obstructive Sleep Apnea--moderate -NPSG 03/02/08 AHI 23 cpap 8.. Cardiolite... nonischemic January 2009 EF 60-65%...echo..09/2007 RV  mild dilitation and dysfunction....echo...09/2007 erectile dysfunction  Vital Signs:  Patient profile:   76 year old male Height:      69 inches Weight:      194 pounds O2 Sat:      99 % on Room air Pulse rate:   46 / minute BP sitting:   152 / 75  (left arm) Cuff size:   large  Vitals Entered By: Carlye Grippe (August 14, 2010 12:59 PM)  O2 Flow:  Room air   Other Orders: EKG w/ Interpretation (93000)  Patient Instructions: 1)  Follow up with Dr. Myrtis Ser on Wed, Aug 23, 2010 at 2:15am. 2)  Decrease Clonidine 0.2mg  to 1/2 tablet by mouth two times a day. 3)  Start Chlorthalidone 25mg  by mouth once daily. Prescriptions: CHLORTHALIDONE 25 MG TABS (CHLORTHALIDONE)  Take one tablet by mouth daily  #30 x 6   Entered by:   Cyril Loosen, RN, BSN   Authorized by:   Talitha Givens, MD, [facility]   Signed by:   Cyril Loosen, RN, BSN on 08/14/2010   Method used:   Electronically to        [facility] (retail)       [address]       [place], [place]  [address]       Ph: [phone]       Fax: [phone]   RxID:   [id number]   Handout requested. CLONIDINE HCL 0.2 MG TABS (CLONIDINE HCL) Take 1/2 tablet by mouth two times a day  #30 x  6   Entered by:   Cyril Loosen, RN, BSN   Authorized by:   Talitha Givens, MD, [facility]   Signed by:   Cyril Loosen, RN, BSN on 08/14/2010   Method used:   Electronically to        [facility] (retail)       [address]       [place], [place]  [address]       Ph: [phone]       Fax: [phone]   RxID:   [id number]   Appended Document: [facility]      Visit Type:  Follow-up Referring Provider:  Fannie Knee, MD Primary Provider:  Dr. Viviann Spare [name]/ [name]  CC:  hypertension.  History of Present Illness: Patient is seen for followup of hypertension.  I have reviewed his prior records at great length.  I have rediscussed multiple medications with him. I have also reviewed the approach to refractory hypertension.  The following notes summarize my thoughts. Thiazide diuretic was stopped December, 2010 with a syncopal episode. There was mild hyponatremia at that time.  Because this episode was probably vasovagal or micturition, we can try a diuretic again.  The patient understands and we will try chlorthalidone. Carvedilol had been tried but the patient has resting sinus bradycardia.   this may be considered again because it is a vasodilating beta blocker and good for hypertension. Minoxidil was tried and the patient had significant swelling.  This will not be tried again Norvasc caused swelling in the past.  The patient admits that this occurred when he was significantly heavier.  He agrees that it may be worth a retrial while on a diuretic because it did work with his systolic hypertension. Clonidine is very short acting drug.  If we are not able to wean it, I will useTenex, a longer acting central acting drug. Diltiazem is used because of a history of supraventricular tachycardia Renal artery Doppler was normal and there is no evidence of renal artery stenosis. I do not have data concerning the workup of pheochromocytoma. I do  not have data concerning the workup to rule out hyper-Aldo.  Plasma renin and serum Aldo will be considered We will limit the patient's use of NSAIDs The patient is on an ACE and ARB Aldactone has not been used yet   Allergies: 1)  Codeine  Past History:  Past Medical History: Anxiety Depression GERD Hyperlipidemia Hypertension...renal artery Doppler... December 29, 2009.... normal Abdominal aorta.... normal size.... ultrasound... December 29, 2009 asbestos exposure, hx--asbestos-related pleural plaques overactive bladder Erectile dysfunction arthritis Hearing loss in L ear Baker's Cyst L  leg SVT.... AVNRT... AV nodal reentry tachycardia Allergic rhinitis Obstructive Sleep Apnea--moderate -NPSG 03/02/08 AHI 23 cpap 8.. Cardiolite... nonischemic January 2009 EF 60-65%...echo..09/2007 RV  mild dilitation and dysfunction....echo...09/2007 erectile dysfunction Syncope   felt to be vasovagal or micturition syncope... December, 2010  Review of Systems       Patient denies fever, chills, headache, sweats, rash, change in vision, change in hearing, chest pain, cough, nausea vomiting, urinary symptoms.  All other systems are reviewed and are negative.  Physical Exam  General:  he looks good the day. Head:  head is atraumatic. Eyes:  no xanthelasma. Neck:  no jugular venous distention. Chest Wall:  no chest wall tenderness. Lungs:  lungs are clear.  Respiratory effort is nonlabored. Heart:  cardiac exam reveals S1-S2.  No clicks or significant murmurs. Abdomen:  abdomen is soft. Msk:  no musculoskeletal deformities. Extremities:  no peripheral edema. Skin:  no skin rashes. Psych:  patient is oriented to person time and place.  Affect is normal.   Impression & Recommendations:  Problem # 1:  SYNCOPE (ICD-780.2)  His updated medication list for this problem includes:    Bayer Aspirin Ec Low Dose 81 Mg Tbec (Aspirin) .Marland Kitchen... Take 1 by mouth once daily in morning    Quinapril Hcl  40 Mg Tabs (Quinapril hcl) .Marland Kitchen... Take 1 tablet by mouth in morning and one by mouth at night    Diltiazem Hcl Er Beads 120 Mg Xr24h-cap (Diltiazem hcl er beads) .Marland Kitchen... Take 1 tablet by mouth in morning and 1 by mouth at night This syncope was felt to have been vasovagal or micturition syncope.  He has had no recurrence.  No further workup.  There is no sign that he was dehydrated from a diuretic at that time.  There was slight hyponatremic.  Problem # 2:  * SVT Patient has not had any significant recurrent supraventricular tachycardia.  Diltiazem discontinued.  Problem # 3:  BRADYCARDIA (ICD-427.89)  His updated medication list for this problem includes:    Bayer Aspirin Ec Low Dose 81 Mg Tbec (Aspirin) .Marland Kitchen... Take 1 by mouth once daily in morning    Quinapril Hcl 40 Mg Tabs (Quinapril hcl) .Marland Kitchen... Take 1 tablet by mouth in morning and one by mouth at night    Diltiazem Hcl Er Beads 120 Mg Xr24h-cap (Diltiazem hcl er beads) .Marland Kitchen... Take 1 tablet by mouth in morning and 1 by mouth at night The patient's resting heart rate is relatively low.  This is why he has not been significant beta blocker.  Carvedilol will be the drug of choice but will not be started at this time because of bradycardia.  Problem # 4:  HYPERTENSION (ICD-401.9)  His updated medication list for this problem includes:    Bayer Aspirin Ec Low Dose 81 Mg Tbec (Aspirin) .Marland Kitchen... Take 1 by mouth once daily in morning    Quinapril Hcl 40 Mg Tabs (Quinapril hcl) .Marland Kitchen... Take 1 tablet by mouth in morning and one by mouth at night    Diltiazem Hcl Er Beads 120 Mg Xr24h-cap (Diltiazem hcl er beads) .Marland Kitchen... Take 1 tablet by mouth in morning and 1 by mouth at night    Cozaar 50 Mg Tabs (Losartan potassium) .Marland Kitchen... Take 1 tablet by mouth once a day in morning    Clonidine Hcl 0.2 Mg Tabs (Clonidine hcl) .Marland Kitchen... Take 1/2 tablet by mouth two times a day    Chlorthalidone 25 Mg Tabs (Chlorthalidone) .Marland Kitchen... Take one tablet by mouth daily See the  full  discussion in the history of present illness.  Today we will start chlorthalidone 25 mg daily.  His clonidine will be cut to 0.1 p.o. b.i.d.  I will see him back next week.  The next step will be to consider replacing clonidine with Tenex or possibly using a retrial of Norvasc.  Spironolactone will be considered.  Consideration will be given to plasma renin and serum Aldo and ruling out pheochromocytoma if it has not been done already.  The patient will be encouraged to not use and NSAIDS.

## 2010-11-16 NOTE — Letter (Signed)
Summary: [facility] D/C DR. Quintin Alto  [facility] D/C DR. Quintin Alto   Imported By: Zachary George 12/08/2009 12:28:39  _____________________________________________________________________  External Attachment:    Type:   Image     Comment:   External Document

## 2010-11-16 NOTE — Progress Notes (Signed)
Summary: NEED RX QUINAPRIL  Medications Added ATIVAN 1 MG  TABS (LORAZEPAM) as needed ATIVAN 1 MG  TABS (LORAZEPAM) at bedtime and as needed ATIVAN 1 MG  TABS (LORAZEPAM) at bedtime and as needed PRILOSEC 20 MG  CPDR (OMEPRAZOLE) 1 by mouth once daily PRILOSEC 20 MG  CPDR (OMEPRAZOLE) 1 by mouth once daily NEXIUM 40 MG  CPDR (ESOMEPRAZOLE MAGNESIUM) 1 by mouth once daily EXFORGE 5-320 MG  TABS (AMLODIPINE BESYLATE-VALSARTAN) 1 by mouth once daily QUINAPRIL HCL 40 MG  TABS (QUINAPRIL HCL) 1 by mouth once daily EXFORGE 5-320 MG  TABS (AMLODIPINE BESYLATE-VALSARTAN) 1 by mouth once daily DIOVAN 160 MG  TABS (VALSARTAN) 1 by mouth once daily DIOVAN 320 MG  TABS (VALSARTAN) 1 by mouth once daily AMLODIPINE BESYLATE 10 MG  TABS (AMLODIPINE BESYLATE) 1 by mouth once daily AMLODIPINE BESYLATE 10 MG  TABS (AMLODIPINE BESYLATE) 1 by mouth once daily CHLORTHALIDONE 25 MG  TABS (CHLORTHALIDONE) 1/2 by mouth once daily CHLORTHALIDONE 25 MG  TABS (CHLORTHALIDONE) 1/2 by mouth once daily POTASSIUM CHLORIDE 20 MEQ  PACK (POTASSIUM CHLORIDE) 3 by mouth once daily KLOR-CON M20 20 MEQ  TBCR (POTASSIUM CHLORIDE CRYS CR) 2 by mouth once daily KLOR-CON M20 20 MEQ  TBCR (POTASSIUM CHLORIDE CRYS CR) 1 by mouth once daily KLOR-CON M20 20 MEQ  TBCR (POTASSIUM CHLORIDE CRYS CR) 1 by mouth once daily MULTIVITAMINS   TABS (MULTIPLE VITAMIN) 1 by mouth once daily MULTIVITAMINS   TABS (MULTIPLE VITAMIN) 1 by mouth once daily CO Q-10 50 MG  CAPS (COENZYME Q10) 2 by mouth once daily FISH OIL CONCENTRATE   CAPS (OMEGA-3 FATTY ACIDS CAPS) once daily FISH OIL CONCENTRATE   CAPS (OMEGA-3 FATTY ACIDS CAPS) once daily ALEVE   TABS (NAPROXEN SODIUM TABS) as needed TYLENOL   TABS (ACETAMINOPHEN TABS) as needed LEVITRA 20 MG  TABS (VARDENAFIL HCL) 1 by mouth as needed LEVITRA 20 MG  TABS (VARDENAFIL HCL) 1 by mouth as needed TRAZODONE HCL 100 MG  TABS (TRAZODONE HCL) 1 by mouth 1 hour before bed TRAZODONE HCL 100 MG  TABS  (TRAZODONE HCL) 1 by mouth 1 hour before bed MAXZIDE-25 37.5-25 MG  TABS (TRIAMTERENE-HCTZ) 1 by mouth once daily MAXZIDE-25 37.5-25 MG  TABS (TRIAMTERENE-HCTZ) 1 by mouth once daily METOPROLOL SUCCINATE 25 MG  TB24 (METOPROLOL SUCCINATE) 1 by mouth once daily METOPROLOL SUCCINATE 25 MG  TB24 (METOPROLOL SUCCINATE) 1 by mouth once daily as needed METOPROLOL SUCCINATE 25 MG  TB24 (METOPROLOL SUCCINATE) 1 by mouth once daily as needed DOXYCYCLINE HYCLATE 100 MG  CAPS (DOXYCYCLINE HYCLATE) 1 by mouth two times a day DOXYCYCLINE HYCLATE 100 MG  CAPS (DOXYCYCLINE HYCLATE) 1 by mouth two times a day METOCLOPRAMIDE HCL 10 MG  TABS (METOCLOPRAMIDE HCL) 1 by mouth every 6 hours as needed for nausea METOCLOPRAMIDE HCL 10 MG  TABS (METOCLOPRAMIDE HCL) 1 by mouth every 6 hours as needed for nausea PRAVASTATIN SODIUM 20 MG  TABS (PRAVASTATIN SODIUM) 1 by mouth once daily at night PRAVASTATIN SODIUM 20 MG  TABS (PRAVASTATIN SODIUM) 1 by mouth once daily LORAZEPAM 1 MG TABS (LORAZEPAM)  LORAZEPAM 1 MG TABS (LORAZEPAM)  NORVASC 10 MG  TABS (AMLODIPINE BESYLATE)  NORVASC 10 MG  TABS (AMLODIPINE BESYLATE)  BAYER ASPIRIN EC LOW DOSE 81 MG  TBEC (ASPIRIN) Take 1 by mouth once daily in morning BAYER ASPIRIN EC LOW DOSE 81 MG  TBEC (ASPIRIN) Take 1 by mouth once daily * TRIAMINOLONE 1%/CETAPHIL 1:1 Apply to affected areas two times a day  as needed * TRIAMINOLONE 1%/CETAPHIL 1:1 Apply to affected areas two times a day as needed DOXAZOSIN MESYLATE 8 MG  TABS (DOXAZOSIN MESYLATE) 1 by mouth at bedtime DOXAZOSIN MESYLATE 8 MG  TABS (DOXAZOSIN MESYLATE) Take 1 tab by mouth at bedtime DOXAZOSIN MESYLATE 4 MG  TABS (DOXAZOSIN MESYLATE) 1 plo at bedtime DOXAZOSIN MESYLATE 8 MG  TABS (DOXAZOSIN MESYLATE) Take 1 tab by mouth at bedtime DOXAZOSIN MESYLATE 8 MG  TABS (DOXAZOSIN MESYLATE) 2 by mouth at bedtime DOXAZOSIN MESYLATE 8 MG  TABS (DOXAZOSIN MESYLATE) 1 by mouth at bedtime QUINAPRIL HCL 40 MG  TABS (QUINAPRIL HCL) 1  by mouth once daily QUINAPRIL HCL 40 MG  TABS (QUINAPRIL HCL) Take 1 tablet by mouth two times a day QUINAPRIL HCL 40 MG  TABS (QUINAPRIL HCL) 1 by mouth two times a day QUINAPRIL HCL 40 MG  TABS (QUINAPRIL HCL) Take 1 tablet by mouth in morning and one by mouth at night QUINAPRIL HCL 40 MG  TABS (QUINAPRIL HCL) 1 by mouth once daily FEXOFENADINE HCL 180 MG TABS (FEXOFENADINE HCL) 1 by mouth once daily FEXOFENADINE HCL 180 MG TABS (FEXOFENADINE HCL) as needed LORATADINE 10 MG  TABS (LORATADINE) 1 by mouth once daily FEXOFENADINE HCL 180 MG TABS (FEXOFENADINE HCL) as needed(anytime) MIRTAZAPINE 30 MG  TABS (MIRTAZAPINE) 1 by mouth at bedtime MIRTAZAPINE 30 MG  TABS (MIRTAZAPINE) 1 by mouth at bedtime CENTRUM SILVER   TABS (MULTIPLE VITAMINS-MINERALS) Take 1 tablet by mouth once a day CENTRUM SILVER   TABS (MULTIPLE VITAMINS-MINERALS) Take 1 tablet by mouth once a day(anytime) LORAZEPAM 1 MG  TABS (LORAZEPAM) Take 1 tab by mouth at bedtime as needed LUNESTA 3 MG  TABS (ESZOPICLONE) 1 by mouth at bedtime LUNESTA 3 MG  TABS (ESZOPICLONE) 1 by mouth at bedtime LORAZEPAM 1 MG TABS (LORAZEPAM) take 1 by mouth at bedtime as needed sleep LORAZEPAM 1 MG TABS (LORAZEPAM) take 1 by mouth at bedtime as needed sleep at night CALCIUM 600 600 MG TABS (CALCIUM CARBONATE) take 1 by mouth once daily CALCIUM 600 600 MG TABS (CALCIUM CARBONATE) take 1 by mouth once daily(anytime) ONDANSETRON HCL 8 MG TABS (ONDANSETRON HCL) 1 by mouth two times a day ONDANSETRON HCL 8 MG TABS (ONDANSETRON HCL) 1 by mouth two times a day DOXYCYCLINE HYCLATE 100 MG CAPS (DOXYCYCLINE HYCLATE) 1 by mouth two times a day DOXYCYCLINE HYCLATE 100 MG CAPS (DOXYCYCLINE HYCLATE) 1 by mouth two times a day OMNARIS 50 MCG/ACT SUSP (CICLESONIDE) 2 sprays in each nostril once daily OMNARIS 50 MCG/ACT SUSP (CICLESONIDE) 2 sprays in each nostril once daily VIAGRA 50 MG TABS (SILDENAFIL CITRATE) 1 by mouth as needed VIAGRA 100 MG TABS  (SILDENAFIL CITRATE) 1 by mouth as needed CIALIS 20 MG TABS (TADALAFIL) 1 by mouth as needed CIALIS 20 MG TABS (TADALAFIL) 1 by mouth as needed PRILOSEC OTC 20 MG TBEC (OMEPRAZOLE MAGNESIUM) 1 by mouth once daily thirty minutes before meal PRILOSEC OTC 20 MG TBEC (OMEPRAZOLE MAGNESIUM) 1 by mouth once daily SONATA 10 MG CAPS (ZALEPLON) 1 for sleep as needed, may repeat x 1 SONATA 10 MG CAPS (ZALEPLON) 1 for sleep as needed, may repeat x 1 DILTIAZEM HCL ER BEADS 120 MG XR24H-CAP (DILTIAZEM HCL ER BEADS) Take 1 tablet by mouth two times a day DILTIAZEM HCL ER BEADS 120 MG XR24H-CAP (DILTIAZEM HCL ER BEADS) Take 1 tablet by mouth in morning and 1 by mouth at night FLOMAX 0.4 MG CAPS (TAMSULOSIN HCL) Take 1 tab by mouth at bedtime FLOMAX  0.4 MG CAPS (TAMSULOSIN HCL) Take 1 tab by mouth at bedtime * VITAMIN A 8000IU 2 pills daily(anytime) * VITAMIN A 8000IU 2 pills daily COZAAR 50 MG TABS (LOSARTAN POTASSIUM) Take 1 tablet by mouth once a day COZAAR 50 MG TABS (LOSARTAN POTASSIUM) Take 1 tablet by mouth once a day in morning CLONIDINE HCL 0.1 MG TABS (CLONIDINE HCL) Take 1 tablet by mouth once a day at night only       Phone Note Call from Patient Call back at Home Phone [phone] Call back at [phone]   Caller: Patient Call For: nurse Summary of Call: patient need new prescription for quinapril sent to [facility]. nurse informed patient that new prescription will be sent.  Initial call taken by: Carlye Grippe,  January 10, 2010 9:54 AM    Prescriptions: QUINAPRIL HCL 40 MG  TABS (QUINAPRIL HCL) Take 1 tablet by mouth in morning and one by mouth at night  #270 x 3   Entered by:   Carlye Grippe   Authorized by:   Talitha Givens, MD, [facility]   Signed by:   Carlye Grippe on 01/10/2010   Method used:   Electronically to        [facility] (retail)       [address]       [place], [place]  [address]       Ph: [phone]       Fax: [phone]    RxID:   [id number]

## 2010-11-16 NOTE — Progress Notes (Signed)
Summary: lorazepam rx  Phone Note Call from Patient Call back at Home Phone [phone]   Caller: Patient Call For: young Reason for Call: Refill Medication Summary of Call: Requests refill on lorazepam 1mg .//[facility] [place] Initial call taken by: Darletta Moll,  October 03, 2010 8:40 AM  Follow-up for Phone Call        Last OV 07/25/2010 Pending OV 07/24/2011 Rx was last given on 01/12/2010 #90 x 3 but spoke with Kriste Basque at [facility] regarding this.  This rx only good for 6 months.    Pt requesting refill asap bc he is out and having problems sleeping.   [facility].  Requesting call back on cell at [phone] once completed.   Dr. Maple Hudson, pls advise.  Thanks! Follow-up by: Gweneth Dimitri RN,  October 03, 2010 10:09 AM  Additional Follow-up for Phone Call Additional follow up Details #1::        Per [facility]-okay to fill as last time.Reynaldo Minium CMA  October 03, 2010 11:36 AM   Rx called in -- pt aware Gweneth Dimitri RN  October 03, 2010 11:54 AM     Prescriptions: LORAZEPAM 1 MG TABS (LORAZEPAM) take 1 by mouth at bedtime as needed sleep at night  #90 x 3   Entered by:   Gweneth Dimitri RN   Authorized by:   Waymon Budge MD   Signed by:   Gweneth Dimitri RN on 10/03/2010   Method used:   Telephoned to ...       [facility] (retail)       [address]       [place], [place]  [address]       Ph: [phone]       Fax: [phone]   RxID:   [id number]

## 2010-11-16 NOTE — Assessment & Plan Note (Signed)
Summary: 3 WK F/U PER 08/23/10-JM    Visit Type:  Follow-up Referring Provider:  Fannie Knee, MD Primary Provider:  Dr. Viviann Spare [name]/ [name]  CC:  hypertension.  History of Present Illness: The patient returns today feeling well.  This is the first time in a very long time and he is smiling and happy with his status.  He has been able to tolerate amlodipine.  He had some headaches when we started but these have resolved.  He had remembered that he had responded well to amlodipine in the past but had significant edema.  He is on chlorthalidone and this also is working well.  Blood pressure today is 150/76.  This is excellent for him.  He has had pressures as low as 142/75.  Preventive Screening-Counseling & Management  Alcohol-Tobacco     Smoking Status: quit     Year Quit: 35 yrs ago  Allergies: 1)  Codeine  Past History:  Past Medical History: Last updated: 08/23/2010 Anxiety Depression GERD Hyperlipidemia Hypertension...renal artery Doppler... December 29, 2009.... normal Abdominal aorta.... normal size.... ultrasound... December 29, 2009 asbestos exposure, hx--asbestos-related pleural plaques overactive bladder Erectile dysfunction arthritis Hearing loss in L ear Baker's Cyst L leg SVT.... AVNRT... AV nodal reentry tachycardia Allergic rhinitis Obstructive Sleep Apnea--moderate -NPSG 03/02/08 AHI 23 cpap 8.. Cardiolite... nonischemic January 2009 EF 60-65%...echo..09/2007 RV  mild dilitation and dysfunction....echo...09/2007 erectile dysfunction Syncope   felt to be vasovagal or micturition syncope... December, 2010 Codependent relationship with his 49 year old son who has drug problems and financial problem  Review of Systems       Patient denies fever, chills, headache, sweats, rash, change in vision, change in hearing, chest pain, cough, nausea vomiting, urinary symptoms.  All of the systems are reviewed and are negative.  Vital Signs:  Patient profile:    76 year old male Weight:      194.25 pounds Pulse rate:   63 / minute BP sitting:   157 / 76  (left arm) Cuff size:   regular  Vitals Entered By: Hoover Brunette, LPN (September 11, 2010 1:57 PM) CC: hypertension Is Patient Diabetic? No Comments 3 wk. f/u   Physical Exam  General:  patient is stable. Eyes:  no xanthelasma. Neck:  no jugular venous stenting. Chest Wall:  no chest wall tenderness. Lungs:  lungs are clear.  Respiratory effort is nonlabored. Heart:  cardiac exam reveals S1-S2.  No clicks or significant murmurs. Abdomen:  abdomen is soft. Extremities:  no peripheral edema. Psych:  patient is oriented to person time and place.  Affect is normal.   Impression & Recommendations:  Problem # 1:  BRADYCARDIA (ICD-427.89)  His updated medication list for this problem includes:    [employer]ayer Aspirin Ec Low Dose 81 Mg Tbec (Aspirin) .Marland Kitchen... Take 1 by mouth once daily in morning    Quinapril Hcl 40 Mg Tabs (Quinapril hcl) .Marland Kitchen... Take 1 tablet by mouth in morning and one by mouth at night    Diltiazem Hcl Er Beads 120 Mg Xr24h-cap (Diltiazem hcl er beads) .Marland Kitchen... Take 1 tablet by mouth in morning and 1 by mouth at night    Amlodipine Besylate 5 Mg Tabs (Amlodipine besylate) .Marland Kitchen... Take one tablet by mouth daily Is not having any significant bradycardia.  Problem # 2:  ERECTILE DYSFUNCTION (ICD-302.72) Viagra works for him.  We will prescribe an additional amount for him.  Problem # 3:  HYPERTENSION (ICD-401.9)  His updated medication list for this problem includes:    [employer]  Aspirin Ec Low Dose 81 Mg Tbec (Aspirin) .Marland Kitchen... Take 1 by mouth once daily in morning    Quinapril Hcl 40 Mg Tabs (Quinapril hcl) .Marland Kitchen... Take 1 tablet by mouth in morning and one by mouth at night    Diltiazem Hcl Er Beads 120 Mg Xr24h-cap (Diltiazem hcl er beads) .Marland Kitchen... Take 1 tablet by mouth in morning and 1 by mouth at night    Cozaar 50 Mg Tabs (Losartan potassium) .Marland Kitchen... Take 1 tablet by mouth once a day in morning     Chlorthalidone 25 Mg Tabs (Chlorthalidone) .Marland Kitchen... Take one tablet by mouth daily    Amlodipine Besylate 5 Mg Tabs (Amlodipine besylate) .Marland Kitchen... Take one tablet by mouth daily We have finally gotten to a reasonable place with his blood pressure.  No further change in therapy.  Orders: T-Basic Metabolic Panel [phone])  Patient Instructions: 1)  Your physician wants you to follow-up in: 6 months. You will receive a reminder letter in the mail one-two months in advance. If you don't receive a letter, please call our office to schedule the follow-up appointment. 2)  Your physician recommends that you continue on your current medications as directed. Please refer to the Current Medication list given to you today. 3)  Your physician recommends that you go to the [facility] for lab work: [employer]. Prescriptions: VIAGRA 50 MG TABS (SILDENAFIL CITRATE) Take 1/2-1 tablet by mouth as needed as directed.  #10 x 0   Entered by:   Cyril Loosen, RN, BSN   Authorized by:   Talitha Givens, MD, [facility]   Signed by:   Cyril Loosen, RN, BSN on 09/11/2010   Method used:   Electronically to        [facility] (retail)       [address]       [place], [place]  [address]       Ph: [phone]       Fax: [phone]   RxID:   [id number] CHLORTHALIDONE 25 MG TABS (CHLORTHALIDONE) Take one tablet by mouth daily  #90 x 3   Entered by:   Cyril Loosen, RN, BSN   Authorized by:   Talitha Givens, MD, [facility]   Signed by:   Cyril Loosen, RN, BSN on 09/11/2010   Method used:   Electronically to        [facility] (ret[facility])       [address]       [place], [place]  [address]       Ph: [phone]       Fax: [phone]   RxID:   [id number] AMLODIPINE BESYLATE 5 MG TABS (AMLODIPINE BESYLATE) Take one tablet by mouth daily  #90 x 3   Entered by:   Cyril Loosen, RN, BSN   Authorized by:   Talitha Givens, MD, [facility]   Signed by:    Cyril Loosen, RN, BSN on 09/11/2010   Method used:   Electronically to        [facility] (retail)       [address]       [place], [place]  [address]       Ph: [phone]       Fax: [phone]   RxID:   [id number]

## 2010-11-16 NOTE — Progress Notes (Signed)
Summary: pharm calling / lorazepam  Phone Note From Pharmacy   [facility] [place] Call For: Julian Fowler  Summary of Call: pharmacist request to speak to nurse about rx (from today) for lorazeoam: re: earlier rx for same. call Julian Fowler at [phone] Initial call taken by: Tivis Ringer, CNA,  January 12, 2010 3:04 PM  Follow-up for Phone Call        pharmacists wanted to advise that pt could not get refill filled until 01-15-10 unless we authorize early fill. I advised to not fill until 4-3 and pt is aware as well that cant get filled until then. Carron Curie CMA  January 12, 2010 4:03 PM

## 2010-11-16 NOTE — Progress Notes (Signed)
Summary: Viagra too Expensive   Phone Note Call from Patient   Caller: [place] Reason for Call: Talk to Nurse Summary of Call: would like rx for viagra changed to another drug. Viagra is too expense.  you can call [facility] at [phone].  Villavicencio said he would be at the pharmacy in 30 minuters if you could take care of this fast. Initial call taken by: Alexis Goodell  Follow-up for Phone Call        Left message to call back on voicemail. Cyril Loosen, RN, BSN  June 22, 2010 5:15 PM  Pt notified Viagra given for ED per Dr. Henrietta Hoover note. He can s/w primary MD regarding alternative, less expensive medications. Follow-up by: Cyril Loosen, RN, BSN,  June 23, 2010 11:23 AM

## 2010-11-16 NOTE — Progress Notes (Signed)
Summary: PLEASE CALL PCP   Phone Note From Other Clinic Call back at [phone]   Caller: Quintin Alto MD Call For: Dr. Myrtis Ser Summary of Call: Patient's BP has been running 160's-180's/ 80's-100. BP today in office is160/84. Patient is currently on quinaprril 60mg  daily, Patient has been on these drugs in the past and failed due to listed reasons. ( hctz- hyponatremia, clonidine-unable to tolerate, side affects, norvasc-swelling,coreg-fatigue,doxazosin-syncope.) MD requesting a discussion with cardiologist to help determine what BP med to try on this patient since he's failed so many,and now having a difficult time deciding what to use for this mutual patient. Nurse informed PCP that MD wouldn't get message till next week. Initial call taken by: Carlye Grippe,  November 18, 2009 2:06 PM  Follow-up for Phone Call        pt is followed here in clinic, by Dr Myrtis Ser, for HTN. suggest he return for scheduled visit with him, to more fully explore any remaining therapeutic options Follow-up by: Nelida Meuse, PA-C,  November 18, 2009 4:32 PM  Additional Follow-up for Phone Call Additional follow up Details #1::        I called Dr. Leandrew Koyanagi today.

## 2010-11-16 NOTE — Progress Notes (Signed)
Summary: Lab Question   Phone Note Call from Patient Call back at Home Phone [phone] Call back at Work Phone [phone]   Summary of Call: Pt called to ask if he can do lab work on 1/3 when he does labs with Dr. Leandrew Koyanagi so that he doesn't have to do labs on 2 days. Pt notified this would be fine. Initial call taken by: Cyril Loosen, RN, BSN,  October 03, 2010 11:46 AM

## 2010-11-16 NOTE — Letter (Signed)
Summary: PATIENT BLOOD PRESSURES  PATIENT BLOOD PRESSURES   Imported By: Claudette Laws 12/08/2009 13:26:38  _____________________________________________________________________  External Attachment:    Type:   Image     Comment:   External Document

## 2010-11-16 NOTE — Miscellaneous (Signed)
Summary: Immunizations/ INFLUENZA  Immunizations/ INFLUENZA   Imported By: Dorise Hiss 06/27/2010 10:54:40  _____________________________________________________________________  External Attachment:    Type:   Image     Comment:   External Document

## 2010-11-16 NOTE — Letter (Signed)
Summary: [facility] D/C DR. Quintin Alto  [facility] D/C DR. Quintin Alto   Imported By: Zachary George 12/08/2009 12:24:16  _____________________________________________________________________  External Attachment:    Type:   Image     Comment:   External Document

## 2010-11-16 NOTE — Assessment & Plan Note (Signed)
Summary: 1 MONTH CK FROM OV OF 7-19 VS  Medications Added VIAGRA 50 MG TABS (SILDENAFIL CITRATE) Take 1/2-1 tablet by mouth as needed as directed.      Allergies Added:   Visit Type:  Follow-up Referring Provider:  Fannie Knee, MD Primary Provider:  Dr. Viviann Spare [name]/ [name]  CC:  hypertension.  History of Present Illness: Patient is seen for followup of hypertension.  I think he is doing extremely well.  He exercises regularly.  His blood pressure here in the office is now in the range of 155 systolic.  He gets higher pressures at home.  He admitted that his pressure is usually okay when he is away from home but higher at home.  He lives alone and is not bothered by anything there but he does become lonely and he worries about any headache or pain.  Patient asked me if he is healthy enough to use Viagra.  He is.  Current Medications (verified): 1)  Aleve   Tabs (Naproxen Sodium Tabs) .... As Needed 2)  Tylenol   Tabs (Acetaminophen Tabs) .... As Needed 3)  Pravastatin Sodium 20 Mg  Tabs (Pravastatin Sodium) .Marland Kitchen.. 1 By Mouth Once Daily At Night 4)  Bayer Aspirin Ec Low Dose 81 Mg  Tbec (Aspirin) .... Take 1 By Mouth Once Daily in Morning 5)  Quinapril Hcl 40 Mg  Tabs (Quinapril Hcl) .... Take 1 Tablet By Mouth in Morning and One By Mouth At Night 6)  Fexofenadine Hcl 180 Mg Tabs (Fexofenadine Hcl) .... As Needed(Anytime) 7)  Centrum Silver   Tabs (Multiple Vitamins-Minerals) .... Take 1 Tablet By Mouth Once A Day(Anytime) 8)  Lorazepam 1 Mg Tabs (Lorazepam) .... Take 1 By Mouth At Bedtime As Needed Sleep At Night 9)  Calcium 600 600 Mg Tabs (Calcium Carbonate) .... Take 1 By Mouth Once Daily(Anytime) 10)  Prilosec Otc 20 Mg Tbec (Omeprazole Magnesium) .Marland Kitchen.. 1 By Mouth Once Daily Thirty Minutes Before Meal 11)  Diltiazem Hcl Er Beads 120 Mg Xr24h-Cap (Diltiazem Hcl Er Beads) .... Take 1 Tablet By Mouth in Morning and 1 By Mouth At Night 12)  Vitamin A 8000iu .... 2 Pills  Daily(Anytime) 13)  Cozaar 50 Mg Tabs (Losartan Potassium) .... Take 1 Tablet By Mouth Once A Day in Morning 14)  Clonidine Hcl 0.1 Mg Tabs (Clonidine Hcl) .... Take 1 Tablet By Mouth Every Morning 15)  Clonidine Hcl 0.2 Mg Tabs (Clonidine Hcl) .... Take One Tablet By Mouth At Bedtime  Allergies (verified): 1)  Codeine  Past History:  Past Medical History: Anxiety Depression GERD Hyperlipidemia Hypertension...renal artery Doppler... December 29, 2009.... normal Abdominal aorta.... normal size.... ultrasound... December 29, 2009 asbestos exposure, hx--asbestos-related pleural plaques overactive bladder Erectile dysfunction arthritis Hearing loss in L ear Baker's Cyst L leg SVT.... AVNRT... AV nodal reentry tachycardia Allergic rhinitis Obstructive Sleep Apnea--moderate -NPSG 03/02/08 AHI 23 cpap 8.. Cardiolite... nonischemic January 2009 EF 60-65%...echo..09/2007 RV  mild dilitation and dysfunction....echo...09/2007 erectile dysfunction  Review of Systems       Patient denies fever, chills, headache, sweats, rash, change in vision, change in hearing, chest pain, cough, nausea vomiting, urinary symptoms.  All the systems are reviewed and are negative.  Vital Signs:  Patient profile:   76 year old male Height:      69 inches Weight:      197 pounds BMI:     29.20 Pulse rate:   48 / minute Resp:     16 per minute BP sitting:  157 / 77  (right arm)  Vitals Entered By: Marrion Coy, CNA (June 22, 2010 1:08 PM)  Physical Exam  General:  patient is stable. Eyes:  no xanthelasma. Neck:  no jugular venous distention. Lungs:  lungs are clear.  Respiratory effort is nonlabored. Heart:  cardiac exam reveals an S1-S2.  No clicks or significant murmurs. Abdomen:  abdomen is soft. Msk:  no musculoskeletal deformities. Extremities:  no peripheral edema. Skin:  no skin rashes. Psych:  patient is oriented to person time and place.  Affect is normal.   Impression &  Recommendations:  Problem # 1:  BRADYCARDIA (ICD-427.89)  His updated medication list for this problem includes:    Bayer Aspirin Ec Low Dose 81 Mg Tbec (Aspirin) .Marland Kitchen... Take 1 by mouth once daily in morning    Quinapril Hcl 40 Mg Tabs (Quinapril hcl) .Marland Kitchen... Take 1 tablet by mouth in morning and one by mouth at night    Diltiazem Hcl Er Beads 120 Mg Xr24h-cap (Diltiazem hcl er beads) .Marland Kitchen... Take 1 tablet by mouth in morning and 1 by mouth at night The patient has bradycardia but he is stable with this.  Problem # 2:  ERECTILE DYSFUNCTION (ICD-302.72) The patient asked if I would prescribe Viagra for him.  In general I leave this to his primary physician.  I have given him prescription for a small number of 50 mg tablets.  He is instructed to start with 25 mg.  I also made it clear to him that he is not consider using this medication to treat his blood pressure. he also knows that he should never be taken with nitroglycerin.  Problem # 3:  HYPERTENSION (ICD-401.9)  His updated medication list for this problem includes:    Bayer Aspirin Ec Low Dose 81 Mg Tbec (Aspirin) .Marland Kitchen... Take 1 by mouth once daily in morning    Quinapril Hcl 40 Mg Tabs (Quinapril hcl) .Marland Kitchen... Take 1 tablet by mouth in morning and one by mouth at night    Diltiazem Hcl Er Beads 120 Mg Xr24h-cap (Diltiazem hcl er beads) .Marland Kitchen... Take 1 tablet by mouth in morning and 1 by mouth at night    Cozaar 50 Mg Tabs (Losartan potassium) .Marland Kitchen... Take 1 tablet by mouth once a day in morning    Clonidine Hcl 0.1 Mg Tabs (Clonidine hcl) .Marland Kitchen...     Clonidine Hcl 0.2 Mg Tabs (Clonidine hcl) .Marland Kitchen... Take one tablet by mouth at bedtime We will adjust his blood pressure medicines slightly further. he will now take clonidine 0.2 b.i.d. as long as he does not develop dry mouth.  Patient Instructions: 1)  Follow up with Dr. Myrtis Ser on Monday, August 14, 2010 at 1pm.  2)  Prescription for Viagra 50mg  was sent to your pharmacy. Initially try only 1/2 tablet. Do  not take with Nitroglycerin or any nitrate medication. See you primary MD for further refills of this medication.  Prescriptions: VIAGRA 50 MG TABS (SILDENAFIL CITRATE) Take 1/2-1 tablet by mouth as needed as directed.  #10 x 0   Entered by:   Cyril Loosen, RN, BSN   Authorized by:   Talitha Givens, MD, [facility]   Signed by:   Cyril Loosen, RN, BSN on 06/22/2010   Method used:   Electronically to        [facility] (retail)       [address] [facility]  [place], [place]  [address]       Ph: [phone]       Fax: [phone]   RxID:   [id number]

## 2010-11-16 NOTE — Letter (Signed)
Summary: [facility] Office Note   [facility] Office Note   Imported By: Roderic Ovens 11/10/2010 14:53:12  _____________________________________________________________________  External Attachment:    Type:   Image     Comment:   External Document

## 2010-11-16 NOTE — Miscellaneous (Signed)
  Clinical Lists Changes  Observations: Added new observation of PAST MED HX: Anxiety Depression GERD Hyperlipidemia Hypertension...renal artery Doppler... December 29, 2009.... normal Abdominal aorta.... normal size.... ultrasound... December 29, 2009 asbestos exposure, hx--asbestos-related pleural plaques overactive bladder Erectile dysfunction arthritis Hearing loss in L ear Baker's Cyst L leg SVT.... AVNRT... AV nodal reentry tachycardia Allergic rhinitis Obstructive Sleep Apnea--moderate -NPSG 03/02/08 AHI 23 cpap 8.. Cardiolite... nonischemic January 2009 EF 60-65%...echo..09/2007 RV  mild dilitation and dysfunction....echo...09/2007   (01/02/2010 16:53) Added new observation of REFERRING MD: Fannie Knee, MD (01/02/2010 16:53) Added new observation of PRIMARY MD: Dr. Leandrew Koyanagi [name] (01/02/2010 16:53)       Past History:  Past Medical History: Anxiety Depression GERD Hyperlipidemia Hypertension...renal artery Doppler... December 29, 2009.... normal Abdominal aorta.... normal size.... ultrasound... December 29, 2009 asbestos exposure, hx--asbestos-related pleural plaques overactive bladder Erectile dysfunction arthritis Hearing loss in L ear Baker's Cyst L leg SVT.... AVNRT... AV nodal reentry tachycardia Allergic rhinitis Obstructive Sleep Apnea--moderate -NPSG 03/02/08 AHI 23 cpap 8.. Cardiolite... nonischemic January 2009 EF 60-65%...echo..09/2007 RV  mild dilitation and dysfunction....echo...09/2007

## 2010-11-16 NOTE — Progress Notes (Signed)
Summary: prescript  Phone Note Call from Patient   Caller: Patient Call For: Julian Fowler Summary of Call: need refill for lorazepam 1mg  [facility] Initial call taken by: Rickard Patience,  January 10, 2010 9:03 AM  Follow-up for Phone Call        requesting refill on Lorazepam. Pt last seen 07/26/2009.  Next ov scheduled for 07/25/2010.  Please advise Arman Filter LPN  January 10, 2010 9:09 AM   Additional Follow-up for Phone Call Additional follow up Details #1::        OK- Katie Additional Follow-up by: Waymon Budge MD,  January 10, 2010 12:47 PM    Additional Follow-up for Phone Call Additional follow up Details #2::    per CDY-ok to refill as requested.Reynaldo Minium CMA  January 10, 2010 1:28 PM  rx sent. Carron Curie CMA  January 10, 2010 1:47 PM   Prescriptions: LORAZEPAM 1 MG TABS (LORAZEPAM) take 1 by mouth at bedtime as needed sleep at night  #30 x 0   Entered by:   Carron Curie CMA   Authorized by:   Waymon Budge MD   Signed by:   Carron Curie CMA on 01/10/2010   Method used:   Telephoned to ...       [facility] (retail)       [address]       [place], [place]  [address]       Ph: [phone]       Fax: [phone]   RxID:   [id number]

## 2010-11-16 NOTE — Assessment & Plan Note (Signed)
Summary: 8wk f/u LA  Medications Added CLONIDINE HCL 0.1 MG TABS (CLONIDINE HCL) Take 1 tablet by mouth two times a day      Allergies Added:   Visit Type:  Follow-up Referring Provider:  Fannie Knee, MD Primary Provider:  Dr. Viviann Spare [name]/ [name]  CC:  hypertension.  History of Present Illness: Patient is seen for followup of hypertension.  I saw him last January 04, 2010.  At that time I considered a small dose of pindolol because he has bradycardia.  I also considered giving him a retrial of clonidine with only a small dose at night.  We started 0.1 mg at nighttime.  He is actually feeling well.  He is exercising a great deal and he continues to lose weight and he looks quite good.  She is extremely focused on the fact that his systolic blood pressure remains elevated.  He brings me a chart showing his blood pressure checks.  His systolic ranges from 160-180.  Diastolic is in the range of 60-70.  He does not have aortic insufficiency.  His mean arterial blood pressure therefore is only slightly increased.  I tried to explain this to him at length.  He asked me if I think that he is becoming fanatic about his blood pressure.  I told him that he is worrying about it much.  Preventive Screening-Counseling & Management  Alcohol-Tobacco     Smoking Status: never  Current Medications (verified): 1)  Aleve   Tabs (Naproxen Sodium Tabs) .... As Needed 2)  Tylenol   Tabs (Acetaminophen Tabs) .... As Needed 3)  Pravastatin Sodium 20 Mg  Tabs (Pravastatin Sodium) .Marland Kitchen.. 1 By Mouth Once Daily At Night 4)  Bayer Aspirin Ec Low Dose 81 Mg  Tbec (Aspirin) .... Take 1 By Mouth Once Daily in Morning 5)  Quinapril Hcl 40 Mg  Tabs (Quinapril Hcl) .... Take 1 Tablet By Mouth in Morning and One By Mouth At Night 6)  Fexofenadine Hcl 180 Mg Tabs (Fexofenadine Hcl) .... As Needed(Anytime) 7)  Centrum Silver   Tabs (Multiple Vitamins-Minerals) .... Take 1 Tablet By Mouth Once A Day(Anytime) 8)   Lorazepam 1 Mg Tabs (Lorazepam) .... Take 1 By Mouth At Bedtime As Needed Sleep At Night 9)  Calcium 600 600 Mg Tabs (Calcium Carbonate) .... Take 1 By Mouth Once Daily(Anytime) 10)  Prilosec Otc 20 Mg Tbec (Omeprazole Magnesium) .Marland Kitchen.. 1 By Mouth Once Daily Thirty Minutes Before Meal 11)  Diltiazem Hcl Er Beads 120 Mg Xr24h-Cap (Diltiazem Hcl Er Beads) .... Take 1 Tablet By Mouth in Morning and 1 By Mouth At Night 12)  Flomax 0.4 Mg Caps (Tamsulosin Hcl) .... Take 1 Tab By Mouth At Bedtime 13)  Vitamin A 8000iu .... 2 Pills Daily(Anytime) 14)  Cozaar 50 Mg Tabs (Losartan Potassium) .... Take 1 Tablet By Mouth Once A Day in Morning 15)  Clonidine Hcl 0.1 Mg Tabs (Clonidine Hcl) .... Take 1 Tablet By Mouth Once A Day At Night Only  Allergies (verified): 1)  Codeine  Comments:  Nurse/Medical Assistant: The patient's medications and allergies were reviewed with the patient and were updated in the Medication and Allergy Lists. List reviewed.  Past History:  Past Medical History: Last updated: 01/02/2010 Anxiety Depression GERD Hyperlipidemia Hypertension...renal artery Doppler... December 29, 2009.... normal Abdominal aorta.... normal size.... ultrasound... December 29, 2009 asbestos exposure, hx--asbestos-related pleural plaques overactive bladder Erectile dysfunction arthritis Hearing loss in L ear Baker's Cyst L leg SVT.... AVNRT... AV nodal reentry tachycardia Allergic  rhinitis Obstructive Sleep Apnea--moderate -NPSG 03/02/08 AHI 23 cpap 8.. Cardiolite... nonischemic January 2009 EF 60-65%...echo..09/2007 RV  mild dilitation and dysfunction....echo...09/2007  Review of Systems       Patient denies fever, chills, headache, sweats, rash, change in vision, change in hearing, chest pain, cough, nausea or vomiting, urinary symptoms.  All of the systems are reviewed and are negative.  Vital Signs:  Patient profile:   76 year old male Height:      69 inches Weight:      201  pounds Pulse rate:   48 / minute BP sitting:   149 / 73  (left arm) Cuff size:   large  Vitals Entered By: Carlye Grippe (Mar 08, 2010 1:33 PM) CC: hypertension   Physical Exam  General:  patient is stable today. Head:  head is atraumatic. Eyes:  no xanthelasma. Neck:  no jugular venous distention. Chest Wall:  no chest wall tenderness. Lungs:  lungs are clear.  Respiratory effort is nonlabored. Heart:  cardiac exam reveals an S1-S2.  There are no clicks or significant murmurs Abdomen:  abdomen is soft. Msk:  no musculoskeletal deformities. Extremities:  no peripheral edema. Skin:  no skin rashes. Psych:  patient is oriented to person time and place.  He is obsessed with his blood pressure problem.   Impression & Recommendations:  Problem # 1:  * SVT The patient has not had any recurrent palpitations.  No further workup.  Problem # 2:  BRADYCARDIA (ICD-427.89)  His updated medication list for this problem includes:    Bayer Aspirin Ec Low Dose 81 Mg Tbec (Aspirin) .Marland Kitchen... Take 1 by mouth once daily in morning    Quinapril Hcl 40 Mg Tabs (Quinapril hcl) .Marland Kitchen... Take 1 tablet by mouth in morning and one by mouth at night    Diltiazem Hcl Er Beads 120 Mg Xr24h-cap (Diltiazem hcl er beads) .Marland Kitchen... Take 1 tablet by mouth in morning and 1 by mouth at night The patient does have bradycardia.  This needs to be kept in mind as I plan to slightly increase his clonidine dose.  He has not had syncope or presyncope.  He exercises regularly and I believe that some of his bradycardia is physiologic  Problem # 3:  HYPERTENSION (ICD-401.9)  His updated medication list for this problem includes:    Bayer Aspirin Ec Low Dose 81 Mg Tbec (Aspirin) .Marland Kitchen... Take 1 by mouth once daily in morning    Quinapril Hcl 40 Mg Tabs (Quinapril hcl) .Marland Kitchen... Take 1 tablet by mouth in morning and one by mouth at night    Diltiazem Hcl Er Beads 120 Mg Xr24h-cap (Diltiazem hcl er beads) .Marland Kitchen... Take 1 tablet by mouth in  morning and 1 by mouth at night    Cozaar 50 Mg Tabs (Losartan potassium) .Marland Kitchen... Take 1 tablet by mouth once a day in morning    Clonidine Hcl 0.1 Mg Tabs (Clonidine hcl) .Marland Kitchen... Take 1 tablet by mouth two times a day The patient isn't assessed with his blood pressure.  His systolic is elevated.  His mean pressure is high normal.  I considered adding spironolactone.  He is taking clonidine only at night as I started with a very low dose.  We will increase his clonidine to 0.1 b.i.d.  I will then see him back for followup.  I spent greater than 30 minutes talking directly to the patient specifically about his multiple concerns about his blood pressure.  I am doing the best I can to  help him.  Patient Instructions: 1)  Increase clonidine to 0.1mg  by mouth two times a day. 2)  Follow up appt with Dr. Myrtis Ser on Tuesday, July 19th at 1pm Prescriptions: CLONIDINE HCL 0.1 MG TABS (CLONIDINE HCL) Take 1 tablet by mouth two times a day  #90 x 3   Entered by:   Cyril Loosen, RN, BSN   Authorized by:   Talitha Givens, MD, [facility]   Signed by:   Cyril Loosen, RN, BSN on 03/08/2010   Method used:   Electronically to        [facility] (retail)       [address]       [place], [place]  [address]       Ph: [phone]       Fax: [phone]   RxID:   [id number]

## 2010-11-16 NOTE — Progress Notes (Signed)
Summary: rx  Phone Note Call from Patient Call back at Home Phone [phone] Call back at Work Phone [phone]   Caller: Patient Call For: Julian Fowler Reason for Call: Talk to Nurse Summary of Call: Lorazepam rx should be for 90 day supply.  Pt doesn't have appt until October.  We refilled for #30 with no refills.  Pt wants for #90 because he saves money that way. Initial call taken by: Eugene Gavia,  January 12, 2010 8:35 AM  Follow-up for Phone Call        we sent a refill for pt on 3/29 fro lorazepam for 30 days, pt is now requesting a 90 day supply because he can get med cheaper that way. Please advise if ok to change rx to 90 days. Thanks. Carron Curie CMA  January 12, 2010 9:30 AM   Additional Follow-up for Phone Call Additional follow up Details #1::        OK to change to 90 day, ref x 3. Additional Follow-up by: Waymon Budge MD,  January 12, 2010 1:09 PM    Additional Follow-up for Phone Call Additional follow up Details #2::    rx for 30 days has been cancelled and a new rx for lorazepam 90 days has been called into [facility] in [place]. Pt is aware. Carron Curie CMA  January 12, 2010 2:32 PM   Prescriptions: LORAZEPAM 1 MG TABS (LORAZEPAM) take 1 by mouth at bedtime as needed sleep at night  #90 x 3   Entered by:   Carron Curie CMA   Authorized by:   Waymon Budge MD   Signed by:   Carron Curie CMA on 01/12/2010   Method used:   Telephoned to ...       [facility] (retail)       [address]       [place], [place]  [address]       Ph: [phone]       Fax: [phone]   RxID:   [id number]

## 2010-11-16 NOTE — Progress Notes (Signed)
Summary: Headaches w/med changes   Phone Note Call from Patient Call back at Home Phone [phone] Call back at Work Phone [phone]   Summary of Call: Pt states since medications were changed last week for blood pressure he is having headaches. Pt states dull headache starting shortly after meds changed. He states last night had a sharp headache. He has been monitoring blood pressures. He gave the following readings:  11/7: 180/72; 11/8: 175/74, 147/69; 11/9: 170/81, 11/10 (started Amlodipine): 162/72, 11/11: 174/80, 11/12: 176/82, (at 5pm-bad headache) 181/72, HR 56; 11/13: 155/72; 11/14: (swam at Y) 164/80; 11/15: 1 hour after BP meds 169/78.   He states he took amlodipine before and his only problem was swelling. He states he has not experienced any swelling. He denies any problems other than headache. He would like to know what he should do for this. He is aware a note will be sent to Dr. Myrtis Ser for further recomendation.   Initial call taken by: Cyril Loosen, RN, BSN,  August 29, 2010 3:10 PM  Follow-up for Phone Call        Call him again. If the headaches are improved, no change.  If the headaches persist encourage him to try Tylenol.  Also decrease amlodipine to 2.5 mg daily.  Additional Follow-up for Phone Call Additional follow up Details #1::        Left message to call back on voicemail. Cyril Loosen, RN, BSN  September 01, 2010 10:46 AM Pt states his headaches have improved. No complaints at present.  Additional Follow-up by: Cyril Loosen, RN, BSN,  September 01, 2010 2:44 PM

## 2010-11-16 NOTE — Assessment & Plan Note (Signed)
Summary: 1 mo fu -srs  Medications Added PRAVASTATIN SODIUM 20 MG  TABS (PRAVASTATIN SODIUM) 1 by mouth once daily at night BAYER ASPIRIN EC LOW DOSE 81 MG  TBEC (ASPIRIN) Take 1 by mouth once daily in morning QUINAPRIL HCL 40 MG  TABS (QUINAPRIL HCL) Take 1 tablet by mouth in morning and one by mouth at night FEXOFENADINE HCL 180 MG TABS (FEXOFENADINE HCL) as needed(anytime) CENTRUM SILVER   TABS (MULTIPLE VITAMINS-MINERALS) Take 1 tablet by mouth once a day(anytime) LORAZEPAM 1 MG TABS (LORAZEPAM) take 1 by mouth at bedtime as needed sleep at night CALCIUM 600 600 MG TABS (CALCIUM CARBONATE) take 1 by mouth once daily(anytime) PRILOSEC OTC 20 MG TBEC (OMEPRAZOLE MAGNESIUM) 1 by mouth once daily thirty minutes before meal DILTIAZEM HCL ER BEADS 120 MG XR24H-CAP (DILTIAZEM HCL ER BEADS) Take 1 tablet by mouth in morning and 1 by mouth at night FLOMAX 0.4 MG CAPS (TAMSULOSIN HCL) Take 1 tab by mouth at bedtime * VITAMIN A 8000IU 2 pills daily(anytime) COZAAR 50 MG TABS (LOSARTAN POTASSIUM) Take 1 tablet by mouth once a day in morning CLONIDINE HCL 0.1 MG TABS (CLONIDINE HCL) Take 1 tablet by mouth once a day at night only        Visit Type:  Follow-up Referring Provider:  Fannie Knee, MD Primary Provider:  Dr. Viviann Spare [name]/ [name]  CC:  hypertension.  History of Present Illness: The patient is seen back today for hypertension.  As I have noted before the patient worries excessively about his blood pressure.  He tells me he's not worried.  Clearly this is not the case.  Once again I spent a prolonged period of time trying to reassure him.  His systolic remains elevated.  His diastolic blood pressure is good.  Renal artery Doppler revealed no renal artery stenosis.  Preventive Screening-Counseling & Management  Alcohol-Tobacco     Smoking Status: never  Current Medications (verified): 1)  Aleve   Tabs (Naproxen Sodium Tabs) .... As Needed 2)  Tylenol   Tabs (Acetaminophen  Tabs) .... As Needed 3)  Pravastatin Sodium 20 Mg  Tabs (Pravastatin Sodium) .Marland Kitchen.. 1 By Mouth Once Daily At Night 4)  Bayer Aspirin Ec Low Dose 81 Mg  Tbec (Aspirin) .... Take 1 By Mouth Once Daily in Morning 5)  Quinapril Hcl 40 Mg  Tabs (Quinapril Hcl) .... Take 1 Tablet By Mouth in Morning and One By Mouth At Night 6)  Fexofenadine Hcl 180 Mg Tabs (Fexofenadine Hcl) .... As Needed(Anytime) 7)  Centrum Silver   Tabs (Multiple Vitamins-Minerals) .... Take 1 Tablet By Mouth Once A Day(Anytime) 8)  Lorazepam 1 Mg Tabs (Lorazepam) .... Take 1 By Mouth At Bedtime As Needed Sleep At Night 9)  Calcium 600 600 Mg Tabs (Calcium Carbonate) .... Take 1 By Mouth Once Daily(Anytime) 10)  Prilosec Otc 20 Mg Tbec (Omeprazole Magnesium) .Marland Kitchen.. 1 By Mouth Once Daily Thirty Minutes Before Meal 11)  Diltiazem Hcl Er Beads 120 Mg Xr24h-Cap (Diltiazem Hcl Er Beads) .... Take 1 Tablet By Mouth in Morning and 1 By Mouth At Night 12)  Flomax 0.4 Mg Caps (Tamsulosin Hcl) .... Take 1 Tab By Mouth At Bedtime 13)  Vitamin A 8000iu .... 2 Pills Daily(Anytime) 14)  Cozaar 50 Mg Tabs (Losartan Potassium) .... Take 1 Tablet By Mouth Once A Day in Morning 15)  Clonidine Hcl 0.1 Mg Tabs (Clonidine Hcl) .... Take 1 Tablet By Mouth Once A Day At Night Only  Allergies: 1)  Codeine  Comments:  Nurse/Medical Assistant: The patient's medications were reviewed with the patient and were updated in the Medication List. Pt brought a list of medications to office visit.  Cyril Loosen, RN, BSN (January 04, 2010 1:13 PM)  Past History:  Past Medical History: Last updated: 01/02/2010 Anxiety Depression GERD Hyperlipidemia Hypertension...renal artery Doppler... December 29, 2009.... normal Abdominal aorta.... normal size.... ultrasound... December 29, 2009 asbestos exposure, hx--asbestos-related pleural plaques overactive bladder Erectile dysfunction arthritis Hearing loss in L ear Baker's Cyst L leg SVT.... AVNRT... AV nodal  reentry tachycardia Allergic rhinitis Obstructive Sleep Apnea--moderate -NPSG 03/02/08 AHI 23 cpap 8.. Cardiolite... nonischemic January 2009 EF 60-65%...echo..09/2007 RV  mild dilitation and dysfunction....echo...09/2007  Social History: Smoking Status:  never  Review of Systems       Patient denies fever, chills, headache, sweats, rash, change in vision, change in hearing, chest pain, cough, nausea vomiting, urinary symptoms.  All other systems are reviewed and are negative.  Vital Signs:  Patient profile:   76 year old male Height:      69 inches Weight:      214.75 pounds BMI:     31.83 Pulse rate:   51 / minute BP sitting:   161 / 79  (left arm) Cuff size:   regular  Vitals Entered By: Cyril Loosen, RN, BSN (January 04, 2010 1:09 PM)  Nutrition Counseling: Patient's BMI is greater than 25 and therefore counseled on weight management options. CC: hypertension Comments Follow up visit. Pt c/o high BP. He states none of the medicaitons are working for him. He states his brother told him he needs Cialis. He states he took 2 of his brother's Cialis and his BP went down. Notified pt he should not take anyone else's prescription medicaitons for safety and legal reasons.   Physical Exam  General:  patient is stable in general. Eyes:  no xanthelasma. Neck:  no jugular venous distention. Lungs:  lungs are clear.  Respiratory effort is nonlabored. Heart:  cardiac exam reveals S1-S2.  No clicks or significant murmurs. Abdomen:  abdomen is soft. Extremities:  no peripheral edema. Psych:  patient is oriented to person time and place.  Affect is normal.   Impression & Recommendations:  Problem # 1:  * SVT Patient has not had any palpitations.  No further workup.  Problem # 2:  BRADYCARDIA (ICD-427.89)  His updated medication list for this problem includes:    Bayer Aspirin Ec Low Dose 81 Mg Tbec (Aspirin) .Marland Kitchen... Take 1 by mouth once daily in morning    Quinapril Hcl 40 Mg Tabs  (Quinapril hcl) .Marland Kitchen... Take 1 tablet by mouth in morning and one by mouth at night    Diltiazem Hcl Er Beads 120 Mg Xr24h-cap (Diltiazem hcl er beads) .Marland Kitchen... Take 1 tablet by mouth in morning and 1 by mouth at night Heart rate is slow but not excessively so.  No change in therapy.  Problem # 3:  HYPERTENSION (ICD-401.9)  His updated medication list for this problem includes:    Bayer Aspirin Ec Low Dose 81 Mg Tbec (Aspirin) .Marland Kitchen... Take 1 by mouth once daily in morning    Quinapril Hcl 40 Mg Tabs (Quinapril hcl) .Marland Kitchen... Take 1 tablet by mouth in morning and one by mouth at night    Diltiazem Hcl Er Beads 120 Mg Xr24h-cap (Diltiazem hcl er beads) .Marland Kitchen... Take 1 tablet by mouth in morning and 1 by mouth at night    Cozaar 50 Mg Tabs (Losartan potassium) .Marland Kitchen... Take 1 tablet  by mouth once a day in morning    Clonidine Hcl 0.1 Mg Tabs (Clonidine hcl) .Marland Kitchen... Take 1 tablet by mouth once a day at night only Patient tells [place] that he took some of his friends Cialis.  This affected his blood pressure slightly.  Fortunately he was not on any nitrate.  We made clear to him that he should not do this again.  However it would be reasonable for him to use Cialis if he wants to for other reasons.  His systolic pressures running in the range of 170.  Diastolic is in the 60 range.  Heart rate is 55.  Next options include the possibility of a small dose of pindolol.  Also we could try a very small dose of clonidine at nighttime only.  He had dry mouth from clonidine during the day before.  I recommended 0.1 mg clonidine at nighttime.  Otherwise see him for follow.  Other Orders: EKG w/ Interpretation (93000)  Patient Instructions: 1)  DON'T TAKE THE CLONIDINE 0.2MG .  2)  CHECK FOR CLONIDINE 0.1MG  AT HOME BOTTLES. THIS ONE CAN BE USED AT NIGHT ONLY.  3)  Your physician recommends that you schedule a follow-up appointment in: MAY 25TH 2011 @1 :45PM WITH DR. Mathilda Maguire.

## 2010-11-16 NOTE — Miscellaneous (Signed)
  Clinical Lists Changes  Problems: Removed problem of ATRIAL FLUTTER (ICD-427.32) Added new problem of SLEEP APNEA (ICD-780.57) Added new problem of * RV DYSFUNCTION Observations: Added new observation of PAST MED HX: Anxiety Depression GERD Hyperlipidemia Hypertension asbestos exposure, hx--asbestos-related pleural plaques overactive bladder Erectile dysfunction arthritis Hearing loss in L ear Baker's Cyst L leg SVT.... AVNRT... AV nodal reentry tachycardia Allergic rhinitis Obstructive Sleep Apnea--moderate -NPSG 03/02/08 AHI 23 cpap 8.. Cardiolite... nonischemic January 2009 EF 60-65%...echo..09/2007 RV  mild dilitation and dysfunction....echo...09/2007   (12/07/2009 13:43) Added new observation of REFERRING MD: Fannie Knee, MD (12/07/2009 13:43) Added new observation of PRIMARY MD: Dr. Leandrew Koyanagi [name] (12/07/2009 13:43)       Past History:  Past Medical History: Anxiety Depression GERD Hyperlipidemia Hypertension asbestos exposure, hx--asbestos-related pleural plaques overactive bladder Erectile dysfunction arthritis Hearing loss in L ear Baker's Cyst L leg SVT.... AVNRT... AV nodal reentry tachycardia Allergic rhinitis Obstructive Sleep Apnea--moderate -NPSG 03/02/08 AHI 23 cpap 8.. Cardiolite... nonischemic January 2009 EF 60-65%...echo..09/2007 RV  mild dilitation and dysfunction....echo...09/2007

## 2010-11-16 NOTE — Miscellaneous (Signed)
Summary: Orders Update  Clinical Lists Changes  Orders: Added new Test order of Renal Artery Duplex (Renal Artery Duplex) - Signed 

## 2010-11-16 NOTE — Progress Notes (Signed)
Summary: NEED APPT/BP CONTROL   Phone Note Call from Patient Call back at Home Phone [phone] Call back at [phone]   Caller: Patient Call For: NURSE Summary of Call: patient is calling for an appt to have BP managed. MD previously spoke with PCP about this. Nurse spoke with patient and offered him to see another physician here since Myrtis Ser didn't have appt till end of April in [place]. patient is willing to drive to [place]. Patient available after lunch anyday or Tuesday and Thursday all day.   12/01/09 Julian Fowler ret'd call wanting to know why noone has called  him about an appointment. Explained to him that we are waiting for a  response from Dr. Myrtis Ser. He is very anxious about his BP being elevated.  please call on cell # [phone]/ v.slaughter 3:56pm  Initial call taken by: Carlye Grippe,  December 01, 2009 11:23 AM  Follow-up for Phone Call        spoke with Dr. Myrtis Ser and informed him of issue and he agreed to add patien to 12/08/09 @1 :15pm. Patient informed of the above.  Follow-up by: Carlye Grippe,  December 01, 2009 4:17 PM

## 2010-11-16 NOTE — Assessment & Plan Note (Signed)
Summary: 8 WK F/U PER 5/25 OV-JM  Medications Added CLONIDINE HCL 0.1 MG TABS (CLONIDINE HCL) Take 1 tablet by mouth every morning CLONIDINE HCL 0.2 MG TABS (CLONIDINE HCL) Take one tablet by mouth at bedtime        Visit Type:  Follow-up Referring Provider:  Fannie Knee, MD Primary Provider:  Dr. Viviann Spare [name]/ [name]  CC:  hypertension.  History of Present Illness: Patient returns for followup of hypertension. He is doing better. I saw him last Mar 08, 2010. At that time we increased his clonidine. He is tolerating this with some dry mouth during the day. He notes that his blood pressure is high S. when he first gets up in the morning. It comes down a little bit from just sitting around. We may be able to improve it better by increasing his nighttime dose of clonidine and leaving the morning dose at 0.1 mg. He is willing to try this.  Preventive Screening-Counseling & Management  Alcohol-Tobacco     Smoking Status: quit     Year Quit: 24 yrs ago  Current Medications (verified): 1)  Aleve   Tabs (Naproxen Sodium Tabs) .... As Needed 2)  Tylenol   Tabs (Acetaminophen Tabs) .... As Needed 3)  Pravastatin Sodium 20 Mg  Tabs (Pravastatin Sodium) .Marland Kitchen.. 1 By Mouth Once Daily At Night 4)  Bayer Aspirin Ec Low Dose 81 Mg  Tbec (Aspirin) .... Take 1 By Mouth Once Daily in Morning 5)  Quinapril Hcl 40 Mg  Tabs (Quinapril Hcl) .... Take 1 Tablet By Mouth in Morning and One By Mouth At Night 6)  Fexofenadine Hcl 180 Mg Tabs (Fexofenadine Hcl) .... As Needed(Anytime) 7)  Centrum Silver   Tabs (Multiple Vitamins-Minerals) .... Take 1 Tablet By Mouth Once A Day(Anytime) 8)  Lorazepam 1 Mg Tabs (Lorazepam) .... Take 1 By Mouth At Bedtime As Needed Sleep At Night 9)  Calcium 600 600 Mg Tabs (Calcium Carbonate) .... Take 1 By Mouth Once Daily(Anytime) 10)  Prilosec Otc 20 Mg Tbec (Omeprazole Magnesium) .Marland Kitchen.. 1 By Mouth Once Daily Thirty Minutes Before Meal 11)  Diltiazem Hcl Er Beads 120 Mg  Xr24h-Cap (Diltiazem Hcl Er Beads) .... Take 1 Tablet By Mouth in Morning and 1 By Mouth At Night 12)  Vitamin A 8000iu .... 2 Pills Daily(Anytime) 13)  Cozaar 50 Mg Tabs (Losartan Potassium) .... Take 1 Tablet By Mouth Once A Day in Morning 14)  Clonidine Hcl 0.1 Mg Tabs (Clonidine Hcl) .... Take 1 Tablet By Mouth Two Times A Day  Allergies: 1)  Codeine  Past History:  Past Medical History: Anxiety Depression GERD Hyperlipidemia Hypertension...renal artery Doppler... December 29, 2009.... normal Abdominal aorta.... normal size.... ultrasound... December 29, 2009 asbestos exposure, hx--asbestos-related pleural plaques overactive bladder Erectile dysfunction arthritis Hearing loss in L ear Baker's Cyst L leg SVT.... AVNRT... AV nodal reentry tachycardia Allergic rhinitis Obstructive Sleep Apnea--moderate -NPSG 03/02/08 AHI 23 cpap 8.. Cardiolite... nonischemic January 2009 EF 60-65%...echo..09/2007 RV  mild dilitation and dysfunction....echo...09/2007  Social History: Smoking Status:  quit  Review of Systems       Patient denies fever, chills, headache, sweats, rash, change in vision, change in hearing, chest pain, cough, nausea vomiting, urinary symptoms. All other systems are reviewed and are negative.  Vital Signs:  Patient profile:   76 year old male Height:      69 inches Weight:      200.50 pounds Pulse rate:   43 / minute BP sitting:   150 /  81  (left arm) Cuff size:   regular  Vitals Entered By: Hoover Brunette, LPN (May 02, 2010 9:01 AM)  Serial Vital Signs/Assessments:  Time      Position  BP       Pulse  Resp  Temp     By 9:06 AM             154/69   47                    Hoover Brunette, LPN  Comments: 6:21 AM Reading from his blood pressure machine he brought from home.1 By: Hoover Brunette, LPN   CC: hypertension Is Patient Diabetic? No   Physical Exam  General:  patient is stable. Eyes:  no xanthelasma. Neck:  no jugular venous distention. Lungs:  lungs  are clear respiratory effort is nonlabored. Heart:  cardiac exam reveals S1-S2. No clicks or significant murmurs Abdomen:  abdomen is soft. Extremities:  no peripheral edema. Psych:  patient is oriented to person time and place. Affect is normal.   Impression & Recommendations:  Problem # 1:  * SVT The patient has had no palpitations. No further workup.  Problem # 2:  BRADYCARDIA (ICD-427.89) There is no significant bradycardia. No further workup.  Problem # 3:  HYPERTENSION (ICD-401.9) Patient finally seems to be more comfortable with the blood pressure values we're obtaining. His systolic pressure is not optimal. However his diastolic pressures low and these are very good blood pressure for him. We will increase his clonidine dose to 0.2 mg at night and leave the morning dose 0.1 mg. He will give this a try.  Patient Instructions: 1)  Take Clonidine 0.1mg  every morning and 0.2mg  at bedtime. Notify the office if you are able to tolerate this increase in dosage and need a refill on the 0.2mg  tablets. Give [place] at least 48 hours to have refill completed. 2)  Follow up with Dr. Myrtis Ser on June 22, 2010 at 1pm. Prescriptions: COZAAR 50 MG TABS (LOSARTAN POTASSIUM) Take 1 tablet by mouth once a day in morning  #90 x 3   Entered by:   Cyril Loosen, RN, BSN   Authorized by:   Talitha Givens, MD, [facility]   Signed by:   Cyril Loosen, RN, BSN on 05/02/2010   Method used:   Electronically to        [facility] (retail)       [address]       [place], [place]  [address]       Ph: [phone]       Fax: [phone]   RxID:   [id number] DILTIAZEM HCL ER BEADS 120 MG XR24H-CAP (DILTIAZEM HCL ER BEADS) Take 1 tablet by mouth in morning and 1 by mouth at night  #90 x 3   Entered by:   Cyril Loosen, RN, BSN   Authorized by:   Talitha Givens, MD, [facility]   Signed by:   Cyril Loosen, RN, BSN on 05/02/2010   Method used:   Electronically to         [facility] (retail)       [address]       [place], [place]  [address]       Ph: [phone]       Fax: [phone]   RxID:   [id number] QUINAPRIL HCL  40 MG  TABS (QUINAPRIL HCL) Take 1 tablet by mouth in morning and one by mouth at night  #180 x 3   Entered by:   Cyril Loosen, RN, BSN   Authorized by:   Talitha Givens, MD, [facility]   Signed by:   Cyril Loosen, RN, BSN on 05/02/2010   Method used:   Electronically to        [facility] (retail)       [address]       [place], [place]  [address]       Ph: [phone]       Fax: [phone]   RxID:   [id number]

## 2010-11-16 NOTE — Assessment & Plan Note (Signed)
Summary: 12 months/apc   Copy to:  Julian Knee, MD Primary Provider/Referring Provider:  Dr. Viviann Spare [name]/ [name]  CC:  Yearly follow up visit-Asbestos exposure-breathing good.Marland Kitchen  History of Present Illness: 01/24/09- Allergic rhinitis, hx asbestos exposure, OSA  Losing weight [place] a [place] Has lost some weight with this. Minimal dry cough "pollen". CXR reviewd from 10/09- Stable plaques with NAD. Denies chest pain, cough, fever, adenopathy, dysphagia, blood.  July 26, 2009- Allergic rhinitis, hx asbestos exposure, OSA/ Insomnia [place]ing and [place] [employer]- has been losing weight. Works mowing lawns 2 days/ week. Less dyspnea and generally feels better. Denies chest pain, hemoptysis, nodes. CXR- 01/25/09 reviewed- stable pleural plaques. Had flu vax on 9/5. Had pneumovax last year. Dr Myrtis Ser told him heart doing well, for 1 year f/u. His insomnia leaves him tired and affects his work- out. Using Lorazepam 1 mg at 10:30 HS, will lie in bed but sometimes sleep latency til 2 AM. Discussed cognitive behavioral therapy. cc:  July 25, 2010-  Allergic rhinitis, hx asbestos exposure, Hx  OSA/ Insomnia Nurse cc: Yearly follow up visit-Asbestos exposure-breathing good. CXR 07/26/09- Bilateral plerual plaques, NAD. Has continued swimming and watching weight- has lost weight from 217 last year and 195 now.  He swims part way under water, total 70 laps .tiw.Marland Kitchen He feels good about his breathing. Denies chest pain, palpitation, blood or nodes. Dr Myrtis Ser is helping with BP control. Runs slow heart rate. Meds give dry mouth.  Says biggest c/o remains his insomnia. Lorazepam does help- "can't function w/o that" taking 1/ night.   Asthma History    Initial Asthma Severity Rating:    Age range: 12+ years    Symptoms: 0-2 days/week    Nighttime Awakenings: 0-2/month    Interferes w/ normal activity: no limitations    SABA use (not for EIB): 0-2 days/week    Asthma Severity Assessment:  Intermittent   Preventive Screening-Counseling & Management  Alcohol-Tobacco     Smoking Status: quit     Year Started: 30 years     Year Quit: 24 yrs ago  Current Medications (verified): 1)  Aleve   Tabs (Naproxen Sodium Tabs) .... As Needed 2)  Tylenol   Tabs (Acetaminophen Tabs) .... As Needed 3)  Pravastatin Sodium 20 Mg  Tabs (Pravastatin Sodium) .Marland Kitchen.. 1 By Mouth Once Daily At Night 4)  Bayer Aspirin Ec Low Dose 81 Mg  Tbec (Aspirin) .... Take 1 By Mouth Once Daily in Morning 5)  Quinapril Hcl 40 Mg  Tabs (Quinapril Hcl) .... Take 1 Tablet By Mouth in Morning and One By Mouth At Night 6)  Fexofenadine Hcl 180 Mg Tabs (Fexofenadine Hcl) .... As Needed(Anytime) 7)  Centrum Silver   Tabs (Multiple Vitamins-Minerals) .... Take 1 Tablet By Mouth Once A Day(Anytime) 8)  Lorazepam 1 Mg Tabs (Lorazepam) .... Take 1 By Mouth At Bedtime As Needed Sleep At Night 9)  Calcium 600 600 Mg Tabs (Calcium Carbonate) .... Take 1 By Mouth Once Daily(Anytime) 10)  Prilosec Otc 20 Mg Tbec (Omeprazole Magnesium) .Marland Kitchen.. 1 By Mouth Once Daily Thirty Minutes Before Meal 11)  Diltiazem Hcl Er Beads 120 Mg Xr24h-Cap (Diltiazem Hcl Er Beads) .... Take 1 Tablet By Mouth in Morning and 1 By Mouth At Night 12)  Vitamin A 8000iu .... 2 Pills Daily(Anytime) 13)  Cozaar 50 Mg Tabs (Losartan Potassium) .... Take 1 Tablet By Mouth Once A Day in Morning 14)  Clonidine Hcl 0.2 Mg Tabs (Clonidine Hcl) .... Take  One Tablet By Mouth At Bedtime 15)  Viagra 50 Mg Tabs (Sildenafil Citrate) .... Take 1/2-1 Tablet By Mouth As Needed As Directed. 16)  Clonidine Hcl 0.1 Mg Tabs (Clonidine Hcl) .... Take 1 By Mouth  Every Morning  Allergies (verified): 1)  Codeine  Past History:  Past Medical History: Last updated: 06/22/2010 Anxiety Depression GERD Hyperlipidemia Hypertension...renal artery Doppler... December 29, 2009.... normal Abdominal aorta.... normal size.... ultrasound... December 29, 2009 asbestos exposure,  hx--asbestos-related pleural plaques overactive bladder Erectile dysfunction arthritis Hearing loss in L ear Baker's Cyst L leg SVT.... AVNRT... AV nodal reentry tachycardia Allergic rhinitis Obstructive Sleep Apnea--moderate -NPSG 03/02/08 AHI 23 cpap 8.. Cardiolite... nonischemic January 2009 EF 60-65%...echo..09/2007 RV  mild dilitation and dysfunction....echo...09/2007 erectile dysfunction  Past Surgical History: Last updated: 04/14/2007 Cholecystectomy-6/98 acoustic neuroma-3/95 trigger finger(thumb)-2003 middle finger-2006  Family History: Last updated: 07-16-2009 father-deceased-oral cancer mother-deceased-heart attack, breast cancer brother-65-bypass x4, HTN brother-61 sister-67 sister-70-EtOH abuse sister-deceased-alzheimers, kidney disease daughter-50 son-45  Social History: Last updated: 01/15/2008 Widowed Former Smoker-1.5ppd x30 years-quit 1988 Alcohol use-no, used to drink heavily at times Drug use-no  Risk Factors: Exercise: yes (01/06/2009)  Risk Factors: Smoking Status: quit (07/25/2010)  Review of Systems      See HPI  The patient denies shortness of breath with activity, shortness of breath at rest, productive cough, non-productive cough, coughing up blood, chest pain, irregular heartbeats, acid heartburn, indigestion, loss of appetite, weight change, abdominal pain, difficulty swallowing, sore throat, tooth/dental problems, headaches, nasal congestion/difficulty breathing through nose, and sneezing.    Vital Signs:  Patient profile:   76 year old male Height:      69 inches Weight:      195.38 pounds BMI:     28.96 O2 Sat:      99 % on Room air Pulse rate:   47 / minute BP sitting:   120 / 84  (right arm) Cuff size:   regular  Vitals Entered By: Reynaldo Minium CMA (July 25, 2010 9:02 AM)  O2 Flow:  Room air CC: Yearly follow up visit-Asbestos exposure-breathing good.   Physical Exam  Additional Exam:  General: A/Ox3; pleasant  and cooperative, NAD, has lost weight,  SKIN: no rash, lesions NODES: no lymphadenopathy HEENT: [place]/AT, EOM- WNL, Conjuctivae- clear, PERRLA, TM-WNL, Nose- clear, Throat- clear and wnl, Mallampati  II NECK: Supple w/ fair ROM, JVD- none, normal carotid impulses w/o bruits Thyroid- CHEST: Clear to P&A- no rub or crackles. HEART: RRR, no m/g/r heard ABDOMEN:  XBJ:YNWG, nl pulses, no edema  NEURO: Grossly intact to observation      CXR  Procedure date:  07/26/2009  Findings:      DG CHEST 2 VIEW - [id number]   Clinical Data: Asbestosis. Previous smoker.  Hypertension.   CHEST - 2 VIEW   Comparison: 01/25/2009   Findings: Bilateral pleural plaques are again noted.  No acute pulmonary findings.  Normal heart size and shape.   IMPRESSION: Bilateral pleural thickening/plaques.  No acute chest findings.  No interval change.   Read By:  Jonne Ply,  M.D.     Released By:  Jonne Ply,  M.D.   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 CXR doesn't show significant interstial disease. CT is not necessary now.   Problem # 2:  INSOMNIA, CHRONIC (ICD-307.42)  Anxious personality with chronic insomnia. Lorazepam is ok. Silenor is off formulary. I will reassure and counsel him with no  change to his med list.   Problem # 3:  SLEEP APNEA, OBSTRUCTIVE, MODERATE (ICD-327.23) Resolved by weight loss.  Medications Added to Medication List This Visit: 1)  Clonidine Hcl 0.1 Mg Tabs (Clonidine hcl) .... Take 1 by mouth  every morning  Other Orders: Est. Patient Level IV (16109)  Patient Instructions: 1)  Please schedule a follow-up appointment in 1 year. 2)  You are doing well. I look forward to seeing you again next year.    Immunization History:  Influenza Immunization History:    Influenza:  historical (06/15/2010)    CXR  Procedure date:  07/26/2009  Findings:      DG  CHEST 2 VIEW - [id number]   Clinical Data: Asbestosis. Previous smoker.  Hypertension.   CHEST - 2 VIEW   Comparison: 01/25/2009   Findings: Bilateral pleural plaques are again noted.  No acute pulmonary findings.  Normal heart size and shape.   IMPRESSION: Bilateral pleural thickening/plaques.  No acute chest findings.  No interval change.   Read By:  Jonne Ply,  M.D.     Released By:  Jonne Ply,  Judie Petit.D.
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
## 2010-11-16 NOTE — Miscellaneous (Signed)
  Clinical Lists Changes  Observations: Added new observation of CARDIO HPI: I called his feet today with Dr.[name].  The patient has been very worried about his blood pressure.  He has a history of some bradycardia and beta blockers therefore not use.  He has had trouble in the past with_thiazide, clonidine, Norvasc, doxazosin.  We decided in the telephone discussion to push his quinidine to max dose, try a small dose of diltiazem, and consider readding a very small dose of clonidine.  Dr.[name] will make some of these changes and we'll be in touch with me if he needs any further discussion. (11/21/2009 16:39) Added new observation of REFERRING MD: Fannie Knee, MD (11/21/2009 16:39) Added new observation of PRIMARY MD: Dr. Leandrew Koyanagi [name] (11/21/2009 16:39)      Referring Provider:  Fannie Knee, MD Primary Provider:  Dr. Velva Harman   History of Present Illness: I called his feet today with Dr.[name].  The patient has been very worried about his blood pressure.  He has a history of some bradycardia and beta blockers therefore not use.  He has had trouble in the past with_thiazide, clonidine, Norvasc, doxazosin.  We decided in the telephone discussion to push his quinidine to max dose, try a small dose of diltiazem, and consider readding a very small dose of clonidine.  Dr.[name] will make some of these changes and we'll be in touch with me if he needs any further discussion.

## 2011-02-27 NOTE — Assessment & Plan Note (Signed)
[facility]                             PULMONARY OFFICE NOTE   NAME:Julian Fowler, Julian Fowler                      MRN:          [id number]  DATE:05/05/2007                            DOB:          06/08/1935    PROBLEM LIST:  1. Chronic insomnia.  2. Asbestos related pleural plaques.  3. Cough.  4. Esophageal reflux.  5. Mild asthma.   HISTORY:  For the last 10 days he says he had some scratchy throat, just  has not felt well with more coughing and blowing.  It is moving into his  chest and it acts like a cold.  He has had to change primary physicians,  after the death of Dr. Eliberto Ivory.   MEDICATIONS:  1. Norvasc.  2. Nexium or Prevacid.  3. Accupril 10 mg.  4. Chlorthalidone.  5. Multivitamin.  6. Lorazepam 1 mg p.r.n. anxiety.  7. Tylenol occasional.   No medication allergy.   OBJECTIVE:  VITAL SIGNS:  Weight 271 pounds, BP 118/72, pulse 57, room  air saturation 95%.  GENERAL:  He remains obese.  HEENT:  Moderate nasal congestion.  Turbinates are edematous.  There is  clear mucus-bridging.  No polyps.  His pharynx is clear, not red.  Voice  quality is normal.  CHEST:  Quiet and clear.  HEART:  Sounds are regular without murmur.  EXTREMITIES:  There is no peripheral edema.   IMPRESSION:  1. Long-standing complaint of insomnia which he manages fairly well      sometimes with lorazepam.  2. Asbestos plaques with no evidence of progression at this time.  3. Rhinitis/bronchitis, probably a viral syndrome.   He told us that he had not eaten anything today, so that we could draw  lab work and I reviewed this issue.   PLAN:  1. Neo-Synephrine inhalation.  2. Depo 79 Z-Pak with discussion to hold in case of more purulent      change.  3. Weight loss.  4. CBC and chemistry profile.  5. Schedule return in 6 months, earlier p.r.n.  6. Chest x-ray was done today.  On my computer it looks too light.  We      will see what the radiologist can do  with it pending interpretation      for comparison with previous films.   ADDENDUM:  CBC returns unremarkable with a platelet count of 462,000,  white blood count 8,500, hemoglobin 14.3.  Basic chemistry is normal  with a BUN of 10, creatinine of 0.8.     Clinton D. Maple Hudson, MD, Tonny Bollman, FACP  Electronically Signed    CDY/MedQ  DD: 05/10/2007  DT: 05/11/2007  Job #: [id number]   cc:   Erle Crocker, M.D.

## 2011-02-27 NOTE — Procedures (Signed)
Julian Fowler, Julian Fowler               ACCOUNT NO.:  [id number]   MEDICAL RECORD NO.:  [id number]          PATIENT TYPE:  OUT   LOCATION:  SLEEP LAB                     FACILITY:  [facility]   PHYSICIAN:  Barbaraann Share, MD,FCCPDATE OF BIRTH:  08-09-37   DATE OF STUDY:  03/02/2008                            NOCTURNAL POLYSOMNOGRAM   REFERRING PHYSICIAN:   LOCATION:  Sleep lab.   REFERRING PHYSICIAN:  Dr. Erle Crocker.   DATE OF STUDY:  Mar 02, 2008.   INDICATION FOR STUDY:  Hypersomnia with sleep apnea.   EPWORTH SLEEPINESS SCORE:  4.   SLEEP ARCHITECTURE:  The patient had a total sleep time of 109 minutes  during the diagnostic portion of the study and 205 minutes during the  titration portion.  There was very little slow wave sleep or REM during  the entire night.  Sleep onset latency was mildly prolonged at 34  minutes, and sleep efficiency was significantly decreased both during  the diagnostic and titration portions of the study.   RESPIRATORY DATA:  The patient underwent a split night study, where he  was found to have 42 obstructive events in the first 109 minutes of  sleep.  This gave him an apnea/hypopnea index of 23 events per hour  during the diagnostic portion.  The events during this time were not  positional, but there was loud snoring noted throughout.  By protocol,  the patient was then placed on CPAP with a large Respironics ComfortGel  full face mask.  CPAP titration was initiated and titrated as high as 8  cm of water.  At this pressure, the patient had moderate control of his  obstructive events, however, there were breakthrough events when the  patient turned to the supine position.  Unfortunately, there was not  time during the night to titrate further.   OXYGEN DATA:  There was O2 desaturation as low as 80% with the patient's  obstructive events.   CARDIAC DATA:  No clinically significant arrhythmias were noted.   MOVEMENT-PARASOMNIA:  The patient was  noted to have small numbers of  periodic leg movements during the titration portion of the study with  very little sleep disruption.   IMPRESSIONS-RECOMMENDATIONS:  Moderate obstructive sleep apnea/hypopnea  syndrome with an apnea/hypopnea index of 23 events per hour and O2  desaturation as low as 80% during the diagnostic portion of the study.  The patient was then placed on CPAP with a large Respironics ComfortGel  full face mask, and ultimately titrated to 8 cm of water pressure.  Unfortunately, he did have breakthrough events in the supine position on  this pressure, and there was not enough time left during the night to  further titrate.  I would recommend starting the patient on 8 cm of  water pressure, and then doing a 2 week auto titration device at home  for further optimization in his own home environment.      Barbaraann Share, MD,FCCP  Diplomate, American Board of Sleep  Medicine  Electronically Signed     [facility]/MEDQ  D:  03/04/2008 18:57:13  T:  03/04/2008 25:42:70  Job:  [id number]

## 2011-02-27 NOTE — Assessment & Plan Note (Signed)
[facility]                          [facility]   Julian Fowler, Julian Fowler                      MRN:          [id number]  DATE:11/07/2007                            DOB:          09/14/1935    PRIMARY CARDIOLOGIST:  Dr. Willa Rough.   REASON VISIT:  Post hospital follow-up.   The patient presents to the clinic today in follow-up of a recent  hospitalization here at [facility] in mid-December.  He was referred to  our group for further evaluation of a newly documented supraventricular  tachycardia.  We felt that the rhythm was most likely AVNRT, rather than  atrial flutter, based on a rhythm strip which captured the conversion  from SVT to normal sinus rhythm.  There were no intervening flutter  waves noted, during the conversion.   Clinically, the patient has done well since that singular episode of  tachy palpitations.  He was, however, briefly rehospitalized here at  [facility] only a few weeks ago, for an apparent case of acute influenza.   Since then, the patient reports having had follow-up with his primary  care physician, Dr. Erle Crocker, in [place].  He reports having  had follow-up blood work and reportedly was told that his potassium  level was fine.  This was previously noted as being low, with a level of  3.3 when was seen by [place] in mid-December.   The patient has also since undergone recommended outpatient follow-up,  consisting of a dobutamine stress Cardiolite as well as a 2-D  echocardiogram.  The results of both of these tests were reviewed with  the patient today.   The echocardiogram showed normal ventricular function, but with some  mild RV dysfunction.  There were no significant valvular abnormalities.  There was moderate pulmonary hypertension (43.7 mmHg).   The dobutamine perfusion study showed no evidence of definite ischemia.  Wall motion was within normal limits and ejection fraction was  calculated at  54%.   Electrocardiogram today reveals sinus bradycardia at 50 beats per minute  with normal axis, no ischemic changes.   Notably, the patient was discharged on Toprol XL 25 daily.  She was not  on this medication prior to our evaluation this past December.  He also  points out that he was previously on Norvasc for quite some time,  reportedly with good control of his blood pressure.  This was  substituted with triamterene/HCTZ when he initially presented in  December, however.   CURRENT MEDICATIONS:  Metoprolol ER 25 daily,  triamterine/hydrochlorothiazide 37.5/25 mg daily.  Potassium supplement  2 tablets daily, aspirin 81 daily.  Quinapril 43 daily.   PHYSICAL EXAM:  Blood pressure 151/89, pulse 54, regular weight 274.  GENERAL:  A 76 year old male, morbidly obese, sitting upright in no  distress.  HEENT:  Normocephalic and atraumatic.  NECK:  Palpable carotid pulse without bruits; unable to assess JVD  secondary to neck girth.  LUNGS:  Diminished breath sounds bases, without crackles or wheezes.  HEART:  Regular rate and rhythm (S1, S2) significant murmurs.  ABDOMEN:  Protuberant, nontender.  EXTREMITIES:  Minimally  palpable peripheral pulses.  No significant  edema.  NEURO:  No focal deficit.   IMPRESSION:  1. Transient supraventricular tachycardia.      a.     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      b.     Negative dobutamine stress Cardiolite.      c.     Normal left ventricular function, by 2-D echo.  2. Sinus bradycardia  3. Longstanding hypertension.  4. Moderate pulmonary hypertension.      a.     History of asbestosis.      b.     Mild right ventricular dysfunction.  5. Morbid obesity.  6. Mixed dyslipidemia, untreated.      a.     Recent lipid profile:  total cholesterol of 233,       triglyceride 171, HDL 28, and LDL 71.  7. Remote tobacco.  8. History of asthma.   PLAN:  1. Discontinue long-acting metoprolol, in  light of the noted      bradycardia.  However, we will provide him with a prescription for      Lopressor 25 mg daily to use on a p.r.n. basis, for recurrent tachy      palpitations.  2. The patient strongly urged to return to his primary care physician,      Dr. Jen Mow, in [place], for reassessment of his current      antihypertensive regimen.  He currently feels that it was under      much better control while he was on Norvasc, which he had been on      for quite some time.  However, this was recently changed to      Maxzide, during his initial hospitalization this past December.  3. Schedule return clinic follow-up with myself and Dr. Myrtis Ser in 3      months, for continued assessment of his dysrhythmia and resting      heart rate.      Rozell Searing, PA-C  Electronically Signed      Luis Abed, MD, [facility]  Electronically Signed   GS/MedQ  DD: 11/07/2007  DT: 11/07/2007  Job #: [id number]   cc:   Erle Crocker, M.D.

## 2011-02-27 NOTE — Assessment & Plan Note (Signed)
[facility]                          [facility]   Julian Fowler, Julian Fowler                      MRN:          [id number]  DATE:12/30/2007                            DOB:          1934/11/07    HISTORY:  Julian Fowler is seen for cardiology followup.  He was seen last  in our office on November 07, 2007.  He is now back for followup.  In the  meantime, he has seen Dr. Jen Mow.  The patient tells me about attempts to  control his blood pressure better.  There have been  several medications  tried.  These have included Diovan (valsartan) and Exforge,  (combination of amlodipine and valsartan) and doxazosin.  The patient  said that this did not work.  He likes to take quinapril which he took  in the past and he still has some quinapril.  I suspect that once we get  him to control his salt and total fluid intake, the medications will  begin to work better.  I had a long discussion with him about this  today.   He has not had any recurrent significant palpitations.  He carries  metoprolol tartrate with him just in case.  We know that he has mild-  moderate pulmonary hypertension.  We know that he has good LV function  and no ischemia by a dobutamine perfusion study.   ALLERGIES:  CODEINE and NORVASC (swelling).   MEDICATIONS:  Medication list is confusing at this time because he says  he is on them, but not necessarily taking them.  Therefore his  medication list when he leaves today will include: 1.  Doxazosin 4 mg at  night.  1. Quinapril 40 mg in the morning.  2. Triamterene/hydrochlorothiazide 1 daily.  3. Prilosec.  4. Aspirin.  5. Potassium.  6. Vitamin.  7. Ativan at night.  8. Pravastatin.   OTHER MEDICAL PROBLEMS:  See the complete list on the note of November 07, 2007.   REVIEW OF SYSTEMS:  The main issue today is the concern about his blood  pressure and he is quite anxious about it.  Otherwise his review of  systems is  negative.   PHYSICAL EXAMINATION:  VITAL SIGNS:  Blood pressure 148/86 with a pulse  of 66.  His weight is 270 pounds.  Recent blood pressures have been in  the range of 140-170/75-80 as recorded by him.  His pulse has been in  the low 60s.  The patient is significantly overweight.  HEENT:  Reveals no xanthelasma.  He has normal extraocular motion.  NECK:  There are no carotid bruits.  There is no jugular venous  distention.  LUNGS:  Clear.  Respiratory effort is not labored.  CARDIAC:  Reveals S1-S2.  There are no clicks or significant murmurs.  ABDOMEN:  Obese, but soft.  EXTREMITIES:  He has mild peripheral edema.   PROBLEM LIST:  Problems are listed on the note of November 07, 2007 and  they are reviewed carefully:  #1.  History of supraventricular tachycardia.  He has had no significant  recurrence.  #  2.  Sinus bradycardia.  He will use a beta blocker only on a p.r.n.  basis.  #3.  Hypertension.  See the complete description above.  I have urged  him to watch his salt intake.  I have also talked to him about his fluid  intake.  He drinks an extra 5-6 bottles of water a day.  In his current  situation, this will not be helpful to him.  I explained to him why and  urged him to cut back on his total fluid intake.   PLAN:  Starting today as a new starting point, he will take prazosin 4  mg at night, triamterene/hydrochlorothiazide and quinapril 40 mg.  He  will also carry his metoprolol just in case he has supraventricular  tachycardia.  He will keep his bottle of chlorthalidone, Diovan and  Exforge set aside for now.  I will see him back for followup.  I will be  sure to send all information to Dr. Jen Mow.     Luis Abed, MD, [facility]  Electronically Signed    JDK/MedQ  DD: 12/30/2007  DT: 12/30/2007  Job #: [id number]   cc:   Erle Crocker, M.D.

## 2011-02-27 NOTE — Assessment & Plan Note (Signed)
[facility]                          [facility]   SAMAD, THON                      MRN:          [id number]  DATE:02/10/2008                            DOB:          Aug 19, 1935    HISTORY OF PRESENT ILLNESS:  Mr. Reppond is seen for follow-up.  We are  continuing to work on his blood pressure medicines.  Part of the issue  is that he is anxious about his blood pressure and Dr. Jen Mow has  encouraged him to not be quite as concerned.  I told him that I agreed  and that he did not need to feel compelled to check his blood pressure  twice a day every day.  I encouraged him to check it somewhat less  frequently but at whatever level made him feel comfortable.  He  tolerates quinapril.  He is on 40 mg daily.  His blood pressure is still  a little higher than we would like and we will push his quinapril up to  40 b.i.d.  He does not feel well with amlodipine.   PAST MEDICAL HISTORY:   ALLERGIES:  CODEINE, AND HE FEELS POORLY WITH NORVASC   MEDICATIONS:  Triamterene hydrochlorothiazide, Prilosec, KCl, aspirin  81, quinapril 40 (to be increased to 40 b.i.d.), doxazosin 8 mg at  night, lorazepam 1 mg at night.   OTHER MEDICAL PROBLEMS:  See the list below.   REVIEW OF SYSTEMS:  Overall, he is feeling well.  He does not have any  significant complaints today.  Otherwise, review of systems is negative.   PHYSICAL EXAMINATION:  VITAL SIGNS:  Blood pressure today is 140/82.  He  has shown me a list of his blood pressures in the morning and the  evening, and they range from systolic of 135 to 155.  At times, his  systolic is slightly higher.  His pulse rate today is 60.  Weight is 276  pounds.  GENERAL:  The patient has an overall anxious presentation that is  baseline for him when I have seen him before.  He is oriented to person,  time and place.  LUNGS:  Clear.  Respiratory effort is not labored.  CARDIAC:  Exam reveals S1-S2.   There are no clicks or significant  murmurs.  HEENT:  Reveals that he has normal extraocular motion.  He has no  xanthelasma.  ABDOMEN:  Obese, but soft.  He has no significant peripheral edema.   No labs are done today.   PROBLEMS:  1. History in the past of a transient supraventricular tachycardia.      We think that most probably it was AV-nodal reentrant tachycardia      and there was spontaneous conversion to sinus.  2. Normal LV function.  3. No evidence of ischemia by dobutamine stress Cardiolite.  4. Sinus bradycardia.  5. Moderate pulmonary hypertension with a history of asbestosis.  6. Mild right ventricular dysfunction.  7. Obesity.  8. Dyslipidemia.  9. Remote tobacco.  10.History of some asthma.  11.Baseline mild anxiety.  12.Hypertension.   PLAN:  I think it will  be important to try to adjust his medicines  slowly.  We will push his quinapril up to 40 b.i.d..  He is to see Dr.  Jen Mow very soon.  He mentioned that she wants to look into ruling out the  possibility of sleep apnea, and I certainly agree that this is wise.   I will see the patient back for follow-up and hopefully together Dr.  Jen Mow and I can treat his blood pressure and help him with his anxiety.     Luis Abed, MD, [facility]  Electronically Signed    JDK/MedQ  DD: 02/10/2008  DT: 02/10/2008  Job #: [id number]   cc:   Erle Crocker, M.D.

## 2011-02-27 NOTE — Assessment & Plan Note (Signed)
[facility]                          [facility]   LAVON, BOTHWELL                      MRN:          [id number]  DATE:08/23/2008                            DOB:          [birth year]    Julian Fowler is back for followup.  Julian Fowler is doing well.  He is  motivated and swimming regularly.  He has lost 21 pounds since his last  visit in June 2009.  He is not having any chest pain.  He may have had 1  very brief episode of palpitations.  We know that he has had some  supraventricular tachycardia in the past.  Overall, he is doing well.   PAST MEDICAL HISTORY:   ALLERGIES:  Swelling from [facility] and syncope from CODEINE.   MEDICATIONS:  See the flow sheet.   REVIEW OF SYSTEMS:  He is not having any GI or GU symptoms.  He has no  fevers or chills or headaches.  His review of systems is negative.   PHYSICAL EXAMINATION:  VITAL SIGNS:  Blood pressure 130/78.  He also  brought in many blood pressures that he checked during the day.  His  systolics seem to run in the 140s.  I am not inclined to change his  medicines now.  It is difficult to find medicines that he tolerates.  He  is actively losing weight.  His pressure is only slightly above goal.  This should be followed.  The patient is oriented to person, time, and  place.  Affect is normal.  HEENT:  No xanthelasma.  He is normal extraocular motion.  There are no  carotid bruits.  There is no jugular venous distention.  LUNGS:  Clear.  Respiratory effort is not labored.  CARDIAC:  S1 and S2.  There are no clicks or significant murmurs.  ABDOMEN:  Soft.  EXTREMITIES:  There is no peripheral edema.   Problems are listed on the note of March 18, 2008.  #1.  Hypertension.  See the discussion above.  No change in his meds at  this time.  #2.  Moderate obstructive sleep apnea.  He shows me that his blood  pressures do not appear to be better when he is on CPAP.  He says that  Dr.  Maple Hudson has suggested that he does not have to use his CPAP.  I do not  have the specifics of this issue.  #7.  History of supraventricular tachycardia, stable.  He is stable.  He  needs to continue his good lifestyle changes and I will see him back in  6 months.     Luis Abed, MD, [facility]  Electronically Signed    JDK/MedQ  DD: 08/23/2008  DT: 08/24/2008  Job #: [id number]   cc:   Joni Fears D. Maple Hudson, MD, FCCP, FACP  Erle Crocker, M.D.

## 2011-02-27 NOTE — Assessment & Plan Note (Signed)
[facility]                          [facility]   SHAHZAD, THOMANN                      MRN:          [id number]  DATE:03/18/2008                            DOB:          Jan 01, 1935    CARDIOLOGIST:  Dr. Willa Rough.   PRIMARY CARE PHYSICIAN:  Dr. Erle Crocker.   REASON FOR VISIT:  One-month follow-up.   HISTORY OF PRESENT ILLNESS:  Mr. Julian Fowler is a 76 year old male patient  with a history of supraventricular tachycardia that was probably AV  nodal reentrant tachycardia as well as longstanding hypertension.  He  has not had a recurrence of his SVT.  He has been followed closely by  Dr. Myrtis Ser to further manage his high blood pressure.  The patient returns  today for follow-up.  Since being seen last on February 02, 2008, the  patient has had a sleep study arranged by his primary care physician.  This was read by Dr. Shelle Iron from Mar 02, 2008.  It was significant for  moderate obstructive sleep apnea.  CPAP therapy was recommended.  The  patient notes today that he has not yet heard back from the sleep study.  He is quite concerned about this.  He does feel fatigued from time to  time, especially today.  Otherwise, he denies chest pain, shortness of  breath, or syncope.   CURRENT MEDICATIONS:  1. Triamterene/hydrochlorothiazide 37.5/25 mg daily.  2. Prilosec 20 mg daily.  3. Potassium 20 mEq daily.  4. Aspirin 81 mg daily.  5. Multivitamin.  6. Doxazosin 8 mg daily.  7. Lorazepam 1 mg every night.  8. Quinapril 40 mg b.i.d.  9. Centrum Silver.  10.Metoprolol 25 mg p.r.n.   ALLERGIES:  CODEINE AND NORVASC.   PHYSICAL EXAMINATION:  He is a well-nourished, well-developed male in no  acute stress.  Blood pressure is 162/76, pulse 82, weight 270.8 pounds.  HEENT:  Is normal.  NECK:  Without JVD.  CARDIAC:  Normal S1 and S2.  Regular rate and rhythm.  LUNGS:  Clear to auscultation bilaterally.  ABDOMEN:  Soft, nontender.  EXTREMITIES:  Without edema.  NEUROLOGIC:  He is alert and oriented x3.  Cranial nerves II-XII grossly  intact.   IMPRESSION:  1. Hypertension.  2. Moderate obstructive sleep apnea.  3. Moderate pulmonary hypertension with a history of asbestosis      followed by Dr. Maple Hudson.  4. Good left ventricular function.  5. History of nonischemic Cardiolite study January 2009.  6. Dyslipidemia.  7. History of supraventricular tachycardia without recurrence.   PLAN:  1. Mr. Chestnut returns to the office today for follow-up.  His blood      pressure remains elevated.  He brought in readings again today, and      his average pressure is approximately 150/70.  However, he does      have a new diagnosis of sleep apnea.  This is currently untreated.  2. I have recommended that he seek treatment for his sleep apnea      before we adjust his blood pressure medications further.  Appropriate treatment of sleep apnea may normalize his blood      pressure somewhat.  This may prevent [place] from having to further      titrate his medications.  Of note, he does have significant sinus      bradycardia that is asymptomatic.  We are unable to place him on      daily diltiazem or beta blocker.  He has intolerances to Norvasc.  3. He does see Dr. Maple Hudson for his asbestosis.  I have recommended he      follow up with Dr. Maple Hudson about his sleep apnea.  We will arrange      that followup.  4. The patient will be brought back in follow-up with Dr. Myrtis Ser in the      next four months to review his blood pressure.  After appropriate      treatment with his sleep apnea, if his blood pressure remains      elevated, we will consider further adjustments in his therapy.      Tereso Newcomer, PA-C  Electronically Signed      Luis Abed, MD, [facility]  Electronically Signed   SW/MedQ  DD: 03/18/2008  DT: 03/18/2008  Job #: [id number]   cc:   Joni Fears D. Maple Hudson, MD, FCCP, FACP  Erle Crocker, M.D.

## 2011-03-02 NOTE — Letter (Signed)
September 30, 2008    To Whom it May Concern   RE:  Julian Fowler, Julian Fowler  MRN:  [id number]  /  DOB:  Jan 18, 1935   PRIMARY CARDIOLOGIST:  Luis Abed, MD, [facility]   Julian Fowler is a patient of ours and was most recently seen in our  clinic on August 23, 2008.  He has no known history of coronary artery  disease.  He presented with no recent indications that he should not  continue swimming on a regular basis.  Therefore, the patient is cleared  to swim as previously including a mile, if he so desires, with no  restrictions from our standpoint.    Sincerely,       Gene Serpe, PA-C  Electronically Signed      Luis Abed, MD, [facility]  Electronically Signed   GS/MedQ  DD: 09/30/2008  DT: 10/01/2008  Job #: [id number]

## 2011-03-02 NOTE — Assessment & Plan Note (Signed)
[facility]                             PULMONARY OFFICE NOTE   NAME:HORSLEYKahlil, Cowans                      MRN:          [id number]  DATE:11/06/2006                            DOB:          1935-07-21    PULMONARY FOLLOWUP   PROBLEM:  1. Chronic insomnia.  2. Asbestos-related pleural plaques.  3. Cough.  4. Esophageal reflux.  5. Mild asthma.   HISTORY:  Breathing has been quite good this winter with no chest pain,  sputum, bleeding, adenopathy, or sudden events.  He comments that his  legs swell on a chronic basis over the past year and feels heavy.  He  takes chlorthalidone and is aware his potassium tends to be low but he  is not taking a supplement for that.  He has no history of clotting  disorder or deep venous thrombosis in himself or family.  Lunesta worked  fairly well for his insomnia problem but was too expensive.  He is using  lorazepam 1 mg with 2 Tylenol on some nights.  He says that works well  but he understand not to use it every night because he develops a  tolerance.  We discussed medications, sleep hygiene, and expectations.   MEDICATIONS:  1. Norvasc.  2. Prevacid.  3. Accupril 10 mg.  4. Chlorthalidone.  5. Multivitamin.  6. Lorazepam 1 mg.  7. P.R.N. Tylenol.   No medication allergy.   Chest x-ray from May 06, 2006, here showed stable bilateral pleural  disease.  No acute findings.  Lungs described as clear with no effusion.   IMPRESSION:  1. Chronic insomnia with an anxiety and depression component.  As      needed use of lorazepam with Tylenol is acceptable for now as long      as he continues to work on sleep hygiene.  2. 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  3. Bilateral peripheral edema on chlorthalidone with history of low      potassium's.  I have asked him to followup on this with Dr. Eliberto Ivory.      He needs continued efforts to lose weight.   PLAN:  As above.  Schedule return here in about 6 months with a chest x-  ray, early p.r.n.     Clinton D. Maple Hudson, MD, [facility], FACP  Electronically Signed    CDY/MedQ  DD: 11/06/2006  DT: 11/06/2006  Job #: [id number]   cc:   Weyman Pedro

## 2011-03-02 NOTE — Assessment & Plan Note (Signed)
[place] [facility]                               PULMONARY OFFICE NOTE   NAME:Julian Fowler, Julian Fowler                      MRN:          [id number]  DATE:05/06/2006                            DOB:          [birth year]    PROBLEMS:  1.  Chronic insomnia.  2.  Asbestos-related pleural plaques.  3.  Cough.  4.  Esophageal reflux.  5.  Mild asthma.   HISTORY:  Lorazepam had worked for a long time for his insomnia, but then  stopped working, possibly because of increased stress and anxiety.  He had  tried Ambien 12.5 mg and did not feel that helped.  He asks what else we can  do, and I again reviewed issues of good sleep hygiene, reasonable  expectations for medications, availability of cognitive behavioral therapy.  He denies chest pain, change in cough, discolored sputum or palpitations.   MEDICATIONS:  1.  Norvasc.  2.  Nexium or Prevacid.  3.  Accupril 10 mg.  4.  Chlorthalidone.  5.  Multivitamin.   ALLERGIES:  No medication allergy.   OBJECTIVE:  VITAL SIGNS:  Weight 271 pounds, which is quite heavy for him,  BP 126/80, pulse regular at 60, room air saturation 95%.  GENERAL:  He is obese, alert, and not in any obvious distress.  SKIN:  No rash.  ADENOPATHY:  None at the neck, shoulders or axillae.  HEENT:  Nose and oropharynx were clear.  There is no neck vein distention,  no stridor.  CHEST:  Distinctly clear.  I do not hear rales, crackles, wheeze or rub.  Work of breathing is not increased.  HEART:  Sounds are regular with normal S1, S2, no murmur.  EXTREMITIES:  Heavy legs, 0 to trace edema.   CHEST X-RAY:  Felt today shows stable bilateral pleural disease with no  acute findings.  Pleural plaques are greater on right than left lateral  chest walls.  Otherwise the lung fields are clear.  Do not see interstitial  disease asbestosis, but the plaques are strongly consistent with asbestos  exposure.   Chronic insomnia is still a concern.    PLAN:  1.  Emphasis on sleep hygiene, including encouragement to walk to lose some      weight, and burn off some anxiety and nervous energy.  2.  Sample Lunesta 2 mg with backup prescription for Lunesta 3 mg for trial.  3.  Note to continue watching for obstructive sleep apnea.  4.  Schedule return in six months, earlier p.r.n.                                   Clinton D. Maple Hudson, MD, [facility], FACP   CDY/MedQ  DD:  05/09/2006  DT:  05/09/2006  Job #:  [id number]   cc:   Weyman Pedro, MD

## 2011-03-26 ENCOUNTER — Encounter: Payer: Self-pay | Admitting: Cardiology

## 2011-03-31 ENCOUNTER — Encounter: Payer: Self-pay | Admitting: Cardiology

## 2011-03-31 DIAGNOSIS — R0789 Other chest pain: Secondary | ICD-10-CM | POA: Insufficient documentation

## 2011-03-31 DIAGNOSIS — I4891 Unspecified atrial fibrillation: Secondary | ICD-10-CM | POA: Insufficient documentation

## 2011-03-31 DIAGNOSIS — F419 Anxiety disorder, unspecified: Secondary | ICD-10-CM | POA: Insufficient documentation

## 2011-03-31 DIAGNOSIS — I471 Supraventricular tachycardia: Secondary | ICD-10-CM | POA: Insufficient documentation

## 2011-03-31 DIAGNOSIS — N529 Male erectile dysfunction, unspecified: Secondary | ICD-10-CM | POA: Insufficient documentation

## 2011-03-31 DIAGNOSIS — N3281 Overactive bladder: Secondary | ICD-10-CM | POA: Insufficient documentation

## 2011-03-31 DIAGNOSIS — R9430 Abnormal result of cardiovascular function study, unspecified: Secondary | ICD-10-CM | POA: Insufficient documentation

## 2011-03-31 DIAGNOSIS — Z77090 Contact with and (suspected) exposure to asbestos: Secondary | ICD-10-CM | POA: Insufficient documentation

## 2011-03-31 DIAGNOSIS — G4733 Obstructive sleep apnea (adult) (pediatric): Secondary | ICD-10-CM | POA: Insufficient documentation

## 2011-03-31 DIAGNOSIS — I482 Chronic atrial fibrillation, unspecified: Secondary | ICD-10-CM | POA: Insufficient documentation

## 2011-03-31 DIAGNOSIS — S3500XA Unspecified injury of abdominal aorta, initial encounter: Secondary | ICD-10-CM | POA: Insufficient documentation

## 2011-03-31 DIAGNOSIS — K219 Gastro-esophageal reflux disease without esophagitis: Secondary | ICD-10-CM | POA: Insufficient documentation

## 2011-03-31 DIAGNOSIS — E785 Hyperlipidemia, unspecified: Secondary | ICD-10-CM | POA: Insufficient documentation

## 2011-04-02 ENCOUNTER — Encounter: Payer: Self-pay | Admitting: Cardiology

## 2011-04-02 ENCOUNTER — Ambulatory Visit (INDEPENDENT_AMBULATORY_CARE_PROVIDER_SITE_OTHER): Payer: Medicare Other | Admitting: Cardiology

## 2011-04-02 DIAGNOSIS — R238 Other skin changes: Secondary | ICD-10-CM | POA: Insufficient documentation

## 2011-04-02 DIAGNOSIS — I10 Essential (primary) hypertension: Secondary | ICD-10-CM

## 2011-04-02 DIAGNOSIS — R233 Spontaneous ecchymoses: Secondary | ICD-10-CM | POA: Insufficient documentation

## 2011-04-02 MED ORDER — AMLODIPINE BESYLATE 2.5 MG PO TABS: 2.5000 mg | ORAL_TABLET | Freq: Every day | ORAL | Status: DC

## 2011-04-02 NOTE — Assessment & Plan Note (Signed)
Blood pressure stable on the lower dose of amlodipine.  Is to be continued.

## 2011-04-02 NOTE — Assessment & Plan Note (Signed)
The patient thinks this may be related to chlorthalidone.  I believe it is related to his aspirin.  He is insistent that something be done about this.  He does not have proven coronary disease.  His aspirin can be held at this time we'll see him for followup.

## 2011-04-02 NOTE — Patient Instructions (Addendum)
Follow up as scheduled. Amlodipine 2.5mg  daily. Hold Aspirin.

## 2011-04-02 NOTE — Progress Notes (Signed)
HPI Patient is seen in followup hypertension.  Over time we have finally been able to control his pressure.  Most recently he had some mild peripheral edema and he and his primary physician reduced his amlodipine 5 mg daily.  He is doing well with this was a good choice.  He is concerned about easy bruisability.  He thinks the chlorthalidone maybe playing normal because he is red but this possibility.  It is more likely his aspirin.  He does not have proven coronary disease. Allergies  Allergen Reactions  . Codeine     REACTION: groggy    Current Outpatient Prescriptions  Medication Sig Dispense Refill  . Acetaminophen (TYLENOL 8 HOUR PO) Take by mouth as needed.        Marland Kitchen amLODipine (NORVASC) 5 MG tablet Take 5 mg by mouth daily.        Marland Kitchen aspirin EC 81 MG tablet Take 81 mg by mouth every morning.        . calcium carbonate (OS-CAL) 600 MG TABS Take 600 mg by mouth daily.        . chlorthalidone (HYGROTON) 25 MG tablet Take 25 mg by mouth daily.        Marland Kitchen diltiazem (TIAZAC) 120 MG 24 hr capsule Take 1tablet by mouth in the morning and 1 tablet by mouth at night       . fexofenadine (ALLEGRA) 180 MG tablet Take 180 mg by mouth as needed.        Marland Kitchen LORazepam (ATIVAN) 1 MG tablet Take 1 mg by mouth at bedtime as needed.        Marland Kitchen losartan (COZAAR) 50 MG tablet Take 50 mg by mouth every morning.        . Multiple Vitamins-Minerals (CENTRUM SILVER PO) Take 1 tablet by mouth daily.        . Naproxen Sodium (ALEVE PO) Take by mouth as needed.        Marland Kitchen omeprazole (PRILOSEC) 20 MG capsule Take 1 tablet once daily thirty minutes before a meal       . potassium chloride SA (K-DUR,KLOR-CON) 20 MEQ tablet Take 20 mEq by mouth daily.        . pravastatin (PRAVACHOL) 20 MG tablet Take 20 mg by mouth at bedtime.        . quinapril (ACCUPRIL) 40 MG tablet Take 1 tablet by mouth in morning and one by mouth at night       . vitamin A 8000 UNIT capsule Take 16,000 Units by mouth daily.        Marland Kitchen DISCONTD:  sildenafil (VIAGRA) 50 MG tablet Take 25-50 mg by mouth as needed.          History   Social History  . Marital Status: Divorced    Spouse Name: N/A    Number of Children: N/A  . Years of Education: N/A   Occupational History  . Not on file.   Social History Main Topics  . Smoking status: Former Smoker -- 1.5 packs/day for 30 years    Types: Cigarettes    Quit date: 10/15/1986  . Smokeless tobacco: Never Used  . Alcohol Use: No     Used to drink heavily at times  . Drug Use: No  . Sexually Active: Not on file   Other Topics Concern  . Not on file   Social History Narrative   Co-dependent relationship with his 66 year old son who has drug and financial problems  Family History  Problem Relation Age of Onset  . Cancer Father     oral cancer  . Breast cancer Mother   . Heart attack Mother   . Hypertension Sister     Bypass x4  . Alcohol abuse Sister   . Alzheimer's disease Sister   . Kidney disease Sister     Past Medical History  Diagnosis Date  . Anxiety     codependent relationship with irresponsible son  . Depression   . GERD (gastroesophageal reflux disease)   . Hyperlipidemia   . Hypertension December 29, 2009    renal artery Doppler , normal  12/2009  . Abdominal aorta injury     Normal size, ultrasound, March 17,2011  . Asbestos exposure     Hx of asbestos related pleural plaques  . Overactive bladder   . ED (erectile dysfunction)   . Arthritis   . Hearing loss in left ear   . Baker's cyst     Left leg  . SVT (supraventricular tachycardia)     AVNRT, AV nodal reentry tachycardia  . Allergic rhinitis   . OSA (obstructive sleep apnea)     moderate -NPSG 03/02/08 [facility] 23 cpap 8..  . Syncope December 2010    09/2009  felt to be vasovagal or micturition syncope  . Chest pain     nuclear, 10/2007, normal  . Ejection fraction     60%, echo, 2008, mild RV dysfunction    Past Surgical History  Procedure Date  . Cholecystectomy 6/98  . Craniectomy  for excision of acoustic neuroma 3/95  . Trigger finger release 2003    (thumb) middle finger (2006)    ROS  Patient denies fever, chills, headache, sweats, rash, change in vision, change in hearing, chest pain, cough, nausea vomiting, urinary symptoms.  All other systems are reviewed and are negative.  PHYSICAL EXAM Patient is oriented to person time and place.  Affect is normal.  Head is atraumatic.  Lungs are clear.  Respiratory effort is unlabored.  Cardiac exam reveals S1 and S2.  No clicks or significant murmurs noted abdomen soft.  There is no peripheral edema. Filed Vitals:   04/02/11 1342  BP: 144/78  Pulse: 51  Height: 5\' 9"  (1.753 m)  Weight: 201 lb (91.173 kg)    EKG Is not done today.  ASSESSMENT & PLAN

## 2011-04-27 ENCOUNTER — Ambulatory Visit: Payer: PRIVATE HEALTH INSURANCE | Admitting: Cardiology

## 2011-05-05 ENCOUNTER — Other Ambulatory Visit: Payer: Self-pay | Admitting: Cardiology

## 2011-05-16 ENCOUNTER — Ambulatory Visit (INDEPENDENT_AMBULATORY_CARE_PROVIDER_SITE_OTHER): Payer: Medicare Other | Admitting: Cardiology

## 2011-05-16 ENCOUNTER — Encounter: Payer: Self-pay | Admitting: Cardiology

## 2011-05-16 DIAGNOSIS — N529 Male erectile dysfunction, unspecified: Secondary | ICD-10-CM

## 2011-05-16 DIAGNOSIS — I10 Essential (primary) hypertension: Secondary | ICD-10-CM

## 2011-05-16 MED ORDER — SILDENAFIL CITRATE 100 MG PO TABS
100.0000 mg | ORAL_TABLET | Freq: Every day | ORAL | Status: DC | PRN
Start: 2011-05-16 — End: 2012-07-01

## 2011-05-16 NOTE — Assessment & Plan Note (Signed)
Blood pressure is under very good control.  Reassurance is important for him.  He is very active.  We will not change his medicines at this time.

## 2011-05-16 NOTE — Patient Instructions (Signed)
Your physician wants you to follow-up in: 6 months. You will receive a reminder letter in the mail one-two months in advance. If you don't receive a letter, please call our office to schedule the follow-up appointment. Viagra 100mg  as needed. Do not take with nitroglyerin or Imdur (isosorbide)

## 2011-05-16 NOTE — Progress Notes (Signed)
HPI   Patient is seen for cardiology followup.  I saw him last April 02, 2011.  His amlodipine dose had been lower.  He is doing great.  He exercises at a high level.  He does not have chest pain. Allergies  Allergen Reactions  . Codeine     REACTION: groggy    Current Outpatient Prescriptions  Medication Sig Dispense Refill  . Acetaminophen (TYLENOL 8 HOUR PO) Take by mouth as needed.        Marland Kitchen amLODipine (NORVASC) 2.5 MG tablet Take 1 tablet (2.5 mg total) by mouth daily.  90 tablet  3  . calcium carbonate (OS-CAL) 600 MG TABS Take 600 mg by mouth daily.        . chlorthalidone (HYGROTON) 25 MG tablet Take 25 mg by mouth daily.        Marland Kitchen diltiazem (TIAZAC) 120 MG 24 hr capsule Take 1tablet by mouth in the morning and 1 tablet by mouth at night       . fexofenadine (ALLEGRA) 180 MG tablet Take 180 mg by mouth as needed.        Marland Kitchen LORazepam (ATIVAN) 1 MG tablet Take 1 mg by mouth at bedtime as needed.        Marland Kitchen losartan (COZAAR) 50 MG tablet TAKE ONE TABLET BY MOUTH IN THE MORNING  30 tablet  12  . Multiple Vitamins-Minerals (CENTRUM SILVER PO) Take 1 tablet by mouth daily.        . Naproxen Sodium (ALEVE PO) Take by mouth as needed.        Marland Kitchen omeprazole (PRILOSEC) 20 MG capsule Take 1 tablet once daily thirty minutes before a meal       . potassium chloride SA (K-DUR,KLOR-CON) 20 MEQ tablet Take 20 mEq by mouth daily.        . pravastatin (PRAVACHOL) 20 MG tablet Take 20 mg by mouth at bedtime.        . quinapril (ACCUPRIL) 40 MG tablet Take 1 tablet by mouth in morning and one by mouth at night       . vitamin A 8000 UNIT capsule Take 16,000 Units by mouth daily.          History   Social History  . Marital Status: Divorced    Spouse Name: N/A    Number of Children: N/A  . Years of Education: N/A   Occupational History  . Not on file.   Social History Main Topics  . Smoking status: Former Smoker -- 1.5 packs/day for 30 years    Types: Cigarettes    Quit date: 10/15/1986  .  Smokeless tobacco: Never Used  . Alcohol Use: No     Used to drink heavily at times  . Drug Use: No  . Sexually Active: Not on file   Other Topics Concern  . Not on file   Social History Narrative   Co-dependent relationship with his 27 year old son who has drug and financial problems    Family History  Problem Relation Age of Onset  . Cancer Father     oral cancer  . Breast cancer Mother   . Heart attack Mother   . Hypertension Sister     Bypass x4  . Alcohol abuse Sister   . Alzheimer's disease Sister   . Kidney disease Sister     Past Medical History  Diagnosis Date  . Anxiety     codependent relationship with irresponsible son  . Depression   .  GERD (gastroesophageal reflux disease)   . Hyperlipidemia   . Hypertension December 29, 2009    renal artery Doppler , normal  12/2009  . Abdominal aorta injury     Normal size, ultrasound, March 17,2011  . Asbestos exposure     Hx of asbestos related pleural plaques  . Overactive bladder   . ED (erectile dysfunction)   . Arthritis   . Hearing loss in left ear   . Baker's cyst     Left leg  . SVT (supraventricular tachycardia)     AVNRT, AV nodal reentry tachycardia  . Allergic rhinitis   . OSA (obstructive sleep apnea)     moderate -NPSG 03/02/08 A[facility] 23 cpap 8..  . Syncope December 2010    09/2009  felt to be vasovagal or micturition syncope  . Chest pain     nuclear, 10/2007, normal  . Ejection fraction     60%, echo, 2008, mild RV dysfunction  . Easy bruisability     Past Surgical History  Procedure Date  . Cholecystectomy 6/98  . Craniectomy for excision of acoustic neuroma 3/95  . Trigger finger release 2003    (thumb) middle finger (2006)    ROS  Patient denies fever, chills, headache, sweats, rash, change in vision, change in hearing, chest pain, cough, nausea vomiting, urinary symptoms.  All other systems are reviewed and are negative.  PHYSICAL EXAM Patient is oriented to person time and place.   Affect is normal.  Head is atraumatic.  There is no jugular venous distention.  Lungs are clear.  Respiratory effort is not labored.  Cardiac exam reveals S1-S2.  No clicks or significant murmurs.  The abdomen is soft.  There is no peripheral edema. Filed Vitals:   05/16/11 1323  BP: 129/72  Pulse: 57  Height: 5\' 9"  (1.753 m)  Weight: 202 lb (91.627 kg)    EKG Is not done today. ASSESSMENT & PLAN

## 2011-05-16 NOTE — Assessment & Plan Note (Signed)
The patient does not use nitroglycerin.  He has requested Viagra.  I feel comfortable and we will write for this for him.

## 2011-06-13 ENCOUNTER — Other Ambulatory Visit: Payer: Self-pay | Admitting: Cardiology

## 2011-06-14 ENCOUNTER — Other Ambulatory Visit: Payer: Self-pay | Admitting: *Deleted

## 2011-06-15 ENCOUNTER — Telehealth: Payer: Self-pay | Admitting: *Deleted

## 2011-06-15 MED ORDER — DILTIAZEM HCL ER COATED BEADS 120 MG PO CP24: 120.0000 mg | ORAL_CAPSULE | Freq: Two times a day (BID) | ORAL | Status: DC

## 2011-06-15 NOTE — Telephone Encounter (Signed)
Left message for patient to call office r/e quantity sent to pharmacy for diltiazem.

## 2011-06-23 ENCOUNTER — Other Ambulatory Visit: Payer: Self-pay | Admitting: Internal Medicine

## 2011-06-25 NOTE — Telephone Encounter (Signed)
Please advise if okay to refill. Thanks.  

## 2011-06-26 NOTE — Telephone Encounter (Signed)
Ok to refill till next appointment

## 2011-06-27 ENCOUNTER — Other Ambulatory Visit: Payer: Self-pay | Admitting: Internal Medicine

## 2011-07-09 ENCOUNTER — Other Ambulatory Visit: Payer: Self-pay | Admitting: *Deleted

## 2011-07-09 MED ORDER — LOSARTAN POTASSIUM 50 MG PO TABS: 50.0000 mg | ORAL_TABLET | Freq: Every day | ORAL | Status: DC

## 2011-07-10 ENCOUNTER — Other Ambulatory Visit: Payer: Self-pay | Admitting: Cardiology

## 2011-07-24 ENCOUNTER — Ambulatory Visit: Payer: Self-pay | Admitting: Internal Medicine

## 2011-07-26 ENCOUNTER — Ambulatory Visit (INDEPENDENT_AMBULATORY_CARE_PROVIDER_SITE_OTHER)
Admission: RE | Admit: 2011-07-26 | Discharge: 2011-07-26 | Disposition: A | Discharge status: Home / Self Care | Payer: Medicare Other | Source: Ambulatory Visit | Attending: Internal Medicine | Admitting: Internal Medicine

## 2011-07-26 ENCOUNTER — Ambulatory Visit (INDEPENDENT_AMBULATORY_CARE_PROVIDER_SITE_OTHER): Payer: Medicare Other | Admitting: Internal Medicine

## 2011-07-26 ENCOUNTER — Encounter: Payer: Self-pay | Admitting: Internal Medicine

## 2011-07-26 VITALS — BP 130/78 | HR 48 | Ht 69.0 in | Wt 204.0 lb

## 2011-07-26 DIAGNOSIS — J61 Pneumoconiosis due to asbestos and other mineral fibers: Secondary | ICD-10-CM

## 2011-07-26 DIAGNOSIS — M546 Pain in thoracic spine: Secondary | ICD-10-CM

## 2011-07-26 DIAGNOSIS — G4733 Obstructive sleep apnea (adult) (pediatric): Secondary | ICD-10-CM

## 2011-07-26 DIAGNOSIS — J449 Chronic obstructive pulmonary disease, unspecified: Secondary | ICD-10-CM

## 2011-07-26 DIAGNOSIS — Z87891 Personal history of nicotine dependence: Secondary | ICD-10-CM

## 2011-07-26 MED ORDER — LORAZEPAM 1 MG PO TABS: ORAL_TABLET | ORAL | Status: DC

## 2011-07-26 NOTE — Patient Instructions (Signed)
Order- CXR- dx asbestosis, thoracic back pain  Script- refill lorazepam

## 2011-07-26 NOTE — Progress Notes (Signed)
07/26/11- 76 year old male former smoker followed for allergic rhinitis, asthma, COPD, obstructive sleep apnea, history of asbestos exposure/plaques. Last here- 07/25/2010  Has had flu vaccine. He had been swimming regularly and had kept his weight down. Had surgery on his hand, that hurt his shoulder and has not been swimming is much. Said previously he could swim the length of the pool underwater. Needing lorazepam still to sleep, especially for sleep onset. Since he lost weight he has not needed CPAP. For the last week and a half has had pain across his upper mid back if he stands up straight. This is not affected by lying supine or by coughing.  ROS See HPI Constitutional:   No-   weight loss, night sweats, fevers, chills, fatigue, lassitude. HEENT:   No-  headaches, difficulty swallowing, tooth/dental problems, sore throat,       No-  sneezing, itching, ear ache, nasal congestion, post nasal drip,  CV:  No-   chest pain, orthopnea, PND, swelling in lower extremities, anasarca, dizziness, palpitations Resp: No-   shortness of breath with exertion or at rest.              No-   productive cough,  No non-productive cough,  No-  coughing up of blood.              No-   change in color of mucus.  No- wheezing.   Skin: No-   rash or lesions. GI:  No-   heartburn, indigestion, abdominal pain, nausea, vomiting, diarrhea,                 change in bowel habits, loss of appetite GU: No-   dysuria, change in color of urine, no urgency or frequency.  No- flank pain. MS:  No-   joint pain or swelling.  No- decreased range of motion.  + back pain. Neuro- grossly normal to observation, Or:  Psych:  No- change in mood or affect. No depression or anxiety.  No memory loss.  OBJ General- Alert, Oriented, Affect-appropriate, Distress- none acute Skin- rash-none, lesions- none, excoriation- none, dyed hair Lymphadenopathy- none Head- atraumatic            Eyes- Gross vision intact, PERRLA, conjunctivae  clear secretions            Ears- Hearing, canals-normal            Nose- Clear, no-Septal dev, mucus, polyps, erosion, perforation             Throat- Mallampati II , mucosa clear , drainage- none, tonsils- atrophic. Dental repair. Neck- flexible , trachea midline, no stridor , thyroid nl, carotid no bruit Chest - symmetrical excursion , unlabored           Heart/CV- slow RRR , no murmur , no gallop  , no rub, nl s1 s2                           - JVD- none , edema- none, stasis changes- none, varices- none           Lung- clear to P&A, wheeze- none, cough- none , dullness-none, rub- none. I cannot hear fine crackles or rub.           Chest wall-  Abd- tender-no, distended-no, bowel sounds-present, HSM- no Br/ Gen/ Rectal- Not done, not indicated Extrem- cyanosis- none, clubbing, none, atrophy- none, strength- nl Neuro- grossly intact to observation

## 2011-07-28 NOTE — Assessment & Plan Note (Signed)
For chest x-ray update. Recent back pain sounds as if it might be a compression fracture. I explained the chest x-ray might not image this area well enough that he may need to discuss CT or MRI of the spine primary physician.

## 2011-07-28 NOTE — Assessment & Plan Note (Signed)
He achieved significant weight loss which he has been able to maintain. He is alone but denies snoring or daytime sleepiness and no longer uses CPAP.

## 2011-07-28 NOTE — Assessment & Plan Note (Signed)
Former smoker. No routine cough, wheeze, phlegm. Good exercise tolerance with regular swimming.

## 2011-08-01 NOTE — Progress Notes (Signed)
Quick Note:  Pt aware of results. ______ 

## 2011-09-17 ENCOUNTER — Other Ambulatory Visit: Payer: Self-pay | Admitting: Cardiology

## 2011-09-24 ENCOUNTER — Other Ambulatory Visit: Payer: Self-pay | Admitting: *Deleted

## 2011-09-24 MED ORDER — CHLORTHALIDONE 25 MG PO TABS: 25.0000 mg | ORAL_TABLET | Freq: Every day | ORAL | Status: DC

## 2011-10-18 DIAGNOSIS — M722 Plantar fascial fibromatosis: Secondary | ICD-10-CM | POA: Diagnosis not present

## 2011-10-18 DIAGNOSIS — M79609 Pain in unspecified limb: Secondary | ICD-10-CM | POA: Diagnosis not present

## 2011-11-03 DIAGNOSIS — H409 Unspecified glaucoma: Secondary | ICD-10-CM | POA: Diagnosis not present

## 2011-11-03 DIAGNOSIS — H4011X0 Primary open-angle glaucoma, stage unspecified: Secondary | ICD-10-CM | POA: Diagnosis not present

## 2011-11-22 DIAGNOSIS — M722 Plantar fascial fibromatosis: Secondary | ICD-10-CM | POA: Diagnosis not present

## 2011-11-22 DIAGNOSIS — M79609 Pain in unspecified limb: Secondary | ICD-10-CM | POA: Diagnosis not present

## 2011-11-22 DIAGNOSIS — H903 Sensorineural hearing loss, bilateral: Secondary | ICD-10-CM | POA: Diagnosis not present

## 2011-11-22 DIAGNOSIS — H833X9 Noise effects on inner ear, unspecified ear: Secondary | ICD-10-CM | POA: Diagnosis not present

## 2011-12-05 ENCOUNTER — Other Ambulatory Visit: Payer: Self-pay | Admitting: Otolaryngology

## 2011-12-05 DIAGNOSIS — T1490XA Injury, unspecified, initial encounter: Secondary | ICD-10-CM

## 2011-12-05 DIAGNOSIS — H833X9 Noise effects on inner ear, unspecified ear: Secondary | ICD-10-CM | POA: Diagnosis not present

## 2011-12-05 DIAGNOSIS — H903 Sensorineural hearing loss, bilateral: Secondary | ICD-10-CM | POA: Diagnosis not present

## 2011-12-05 DIAGNOSIS — H9319 Tinnitus, unspecified ear: Secondary | ICD-10-CM | POA: Diagnosis not present

## 2011-12-05 DIAGNOSIS — H905 Unspecified sensorineural hearing loss: Secondary | ICD-10-CM

## 2011-12-11 ENCOUNTER — Ambulatory Visit
Admission: RE | Admit: 2011-12-11 | Discharge: 2011-12-11 | Disposition: A | Discharge status: Home / Self Care | Payer: Medicare Other | Source: Ambulatory Visit | Attending: Otolaryngology | Admitting: Otolaryngology

## 2011-12-11 DIAGNOSIS — H9190 Unspecified hearing loss, unspecified ear: Secondary | ICD-10-CM | POA: Diagnosis not present

## 2011-12-11 DIAGNOSIS — D333 Benign neoplasm of cranial nerves: Secondary | ICD-10-CM | POA: Diagnosis not present

## 2011-12-11 DIAGNOSIS — H905 Unspecified sensorineural hearing loss: Secondary | ICD-10-CM

## 2011-12-11 DIAGNOSIS — T1490XA Injury, unspecified, initial encounter: Secondary | ICD-10-CM

## 2011-12-11 MED ORDER — GADOBENATE DIMEGLUMINE 529 MG/ML IV SOLN
19.0000 mL | Freq: Once | INTRAVENOUS | Status: AC | PRN
  Administered 2011-12-11: 19 mL via INTRAVENOUS

## 2011-12-13 ENCOUNTER — Other Ambulatory Visit (HOSPITAL_COMMUNITY): Payer: Self-pay | Admitting: Interventional Radiology

## 2011-12-13 ENCOUNTER — Other Ambulatory Visit (HOSPITAL_COMMUNITY): Payer: Self-pay | Admitting: Otolaryngology

## 2011-12-13 DIAGNOSIS — I671 Cerebral aneurysm, nonruptured: Secondary | ICD-10-CM

## 2011-12-14 ENCOUNTER — Other Ambulatory Visit: Payer: Self-pay | Admitting: Radiology

## 2011-12-14 ENCOUNTER — Ambulatory Visit (HOSPITAL_COMMUNITY)
Admission: RE | Admit: 2011-12-14 | Discharge: 2011-12-14 | Disposition: A | Discharge status: Home / Self Care | Payer: Medicare Other | Source: Ambulatory Visit | Attending: Interventional Radiology | Admitting: Interventional Radiology

## 2011-12-14 ENCOUNTER — Other Ambulatory Visit (HOSPITAL_COMMUNITY): Payer: Self-pay | Admitting: Interventional Radiology

## 2011-12-14 DIAGNOSIS — I671 Cerebral aneurysm, nonruptured: Secondary | ICD-10-CM

## 2011-12-14 DIAGNOSIS — Z803 Family history of malignant neoplasm of breast: Secondary | ICD-10-CM | POA: Diagnosis not present

## 2011-12-14 DIAGNOSIS — H9190 Unspecified hearing loss, unspecified ear: Secondary | ICD-10-CM | POA: Diagnosis not present

## 2011-12-14 HISTORY — PX: CEREBRAL EMBOLIZATION: SHX1327

## 2011-12-17 ENCOUNTER — Ambulatory Visit (HOSPITAL_COMMUNITY)
Admission: RE | Admit: 2011-12-17 | Discharge: 2011-12-17 | Disposition: A | Discharge status: Home / Self Care | Payer: Medicare Other | Source: Ambulatory Visit | Attending: Interventional Radiology | Admitting: Interventional Radiology

## 2011-12-17 ENCOUNTER — Other Ambulatory Visit (HOSPITAL_COMMUNITY): Payer: Self-pay | Admitting: Interventional Radiology

## 2011-12-17 ENCOUNTER — Encounter (HOSPITAL_COMMUNITY): Payer: Self-pay

## 2011-12-17 DIAGNOSIS — H9190 Unspecified hearing loss, unspecified ear: Secondary | ICD-10-CM | POA: Insufficient documentation

## 2011-12-17 DIAGNOSIS — I10 Essential (primary) hypertension: Secondary | ICD-10-CM | POA: Diagnosis not present

## 2011-12-17 DIAGNOSIS — E785 Hyperlipidemia, unspecified: Secondary | ICD-10-CM | POA: Diagnosis not present

## 2011-12-17 DIAGNOSIS — G4733 Obstructive sleep apnea (adult) (pediatric): Secondary | ICD-10-CM | POA: Diagnosis not present

## 2011-12-17 DIAGNOSIS — H9312 Tinnitus, left ear: Secondary | ICD-10-CM

## 2011-12-17 DIAGNOSIS — H9319 Tinnitus, unspecified ear: Secondary | ICD-10-CM | POA: Diagnosis not present

## 2011-12-17 DIAGNOSIS — I6529 Occlusion and stenosis of unspecified carotid artery: Secondary | ICD-10-CM | POA: Diagnosis not present

## 2011-12-17 DIAGNOSIS — I671 Cerebral aneurysm, nonruptured: Secondary | ICD-10-CM | POA: Insufficient documentation

## 2011-12-17 DIAGNOSIS — D333 Benign neoplasm of cranial nerves: Secondary | ICD-10-CM | POA: Diagnosis not present

## 2011-12-17 LAB — CBC
HCT: 39.2 % (ref 39.0–52.0)
Hemoglobin: 14.3 g/dL (ref 13.0–17.0)
MCH: 31.1 pg (ref 26.0–34.0)
MCHC: 36.5 g/dL — ABNORMAL HIGH (ref 30.0–36.0)
MCV: 85.2 fL (ref 78.0–100.0)
RDW: 13.1 % (ref 11.5–15.5)

## 2011-12-17 LAB — BASIC METABOLIC PANEL
BUN: 11 mg/dL (ref 6–23)
CO2: 28 mEq/L (ref 19–32)
Chloride: 92 mEq/L — ABNORMAL LOW (ref 96–112)
Creatinine, Ser: 0.74 mg/dL (ref 0.50–1.35)
GFR calc Af Amer: >90 mL/min (ref >90)
Glucose, Bld: 113 mg/dL — ABNORMAL HIGH (ref 70–99)
Potassium: 3.2 mEq/L — ABNORMAL LOW (ref 3.5–5.1)

## 2011-12-17 LAB — DIFFERENTIAL
Basophils Absolute: 0 10*3/uL (ref 0.0–0.1)
Basophils Relative: 0 % (ref 0–1)
Eosinophils Relative: 4 % (ref 0–5)
Monocytes Absolute: 1 10*3/uL (ref 0.1–1.0)
Monocytes Relative: 13 % — ABNORMAL HIGH (ref 3–12)
Neutro Abs: 5.2 10*3/uL (ref 1.7–7.7)

## 2011-12-17 MED ORDER — MIDAZOLAM HCL 2 MG/2ML IJ SOLN
INTRAMUSCULAR | Status: AC
  Administered 2011-12-17: 1 mg
  Filled 2011-12-17: qty 4

## 2011-12-17 MED ORDER — SODIUM CHLORIDE 0.9 % IV SOLN
Freq: Once | INTRAVENOUS | Status: AC
  Administered 2011-12-17: 75 mL/h via INTRAVENOUS

## 2011-12-17 MED ORDER — FENTANYL CITRATE 0.05 MG/ML IJ SOLN
INTRAMUSCULAR | Status: AC
  Administered 2011-12-17: 25 ug
  Filled 2011-12-17: qty 4

## 2011-12-17 MED ORDER — FLUMAZENIL 0.5 MG/5ML IV SOLN
INTRAVENOUS | Status: AC
  Filled 2011-12-17: qty 5

## 2011-12-17 MED ORDER — IOHEXOL 300 MG/ML  SOLN
150.0000 mL | Freq: Once | INTRAMUSCULAR | Status: AC | PRN
  Administered 2011-12-17: 84 mL via INTRA_ARTERIAL

## 2011-12-17 MED ORDER — ACETAMINOPHEN 325 MG PO TABS
ORAL_TABLET | ORAL | Status: AC
  Filled 2011-12-17: qty 2

## 2011-12-17 MED ORDER — HEPARIN SOD (PORK) LOCK FLUSH 100 UNIT/ML IV SOLN
INTRAVENOUS | Status: AC | PRN
  Administered 2011-12-17 (×2): 500 [IU] via INTRAVENOUS

## 2011-12-17 MED ORDER — HYDRALAZINE HCL 20 MG/ML IJ SOLN
INTRAMUSCULAR | Status: AC
  Filled 2011-12-17: qty 1

## 2011-12-17 MED ORDER — SODIUM CHLORIDE 0.9 % IV SOLN: INTRAVENOUS | Status: AC

## 2011-12-17 MED ORDER — FENTANYL CITRATE 0.05 MG/ML IJ SOLN
INTRAMUSCULAR | Status: AC | PRN
  Administered 2011-12-17: 25 ug via INTRAVENOUS

## 2011-12-17 MED ORDER — ACETAMINOPHEN 325 MG PO TABS
650.0000 mg | ORAL_TABLET | Freq: Once | ORAL | Status: AC
  Administered 2011-12-17: 650 mg via ORAL

## 2011-12-17 NOTE — H&P (Signed)
Julian Fowler is an 77 y.o. male.   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 Anxiety     codependent relationship with irresponsible son  . Depression   . GERD (gastroesophageal reflux disease)   . Hyperlipidemia   . Hypertension December 29, 2009    renal artery Doppler , normal  12/2009  . Abdominal aorta injury     Normal size, ultrasound, March 17,2011  . Asbestos exposure     Hx of asbestos related pleural plaques  . Overactive bladder   . ED (erectile dysfunction)   . Arthritis   . Hearing loss in left ear   . Baker's cyst     Left leg  . SVT (supraventricular tachycardia)     AVNRT, AV nodal reentry tachycardia  . Allergic rhinitis   . OSA (obstructive sleep apnea)     moderate -NPSG 03/02/08 AHI 23 cpap 8..  . Syncope December 2010    09/2009  felt to be vasovagal or micturition syncope  . Chest pain     nuclear, 10/2007, normal  . Ejection fraction     60%, echo, 2008, mild RV dysfunction  . Easy bruisability     Past Surgical History  Procedure Date  . Cholecystectomy 6/98  . Craniectomy for excision of acoustic neuroma 3/95  . Trigger finger release 2003    (thumb) middle finger (2006)    Family History  Problem Relation Age of Onset  . Cancer Father     oral cancer  . Breast cancer Mother   . Heart attack Mother   . Hypertension Sister     Bypass x4  . Alcohol abuse Sister   . Alzheimer's disease Sister   . Kidney disease Sister    Social History:  reports that he quit smoking about 25 years ago. His smoking use included Cigarettes. He has a 45 pack-year smoking history. He has never used smokeless tobacco. He reports that he does not drink alcohol or use illicit drugs.  Allergies:  Allergies  Allergen Reactions  . Codeine     REACTION: groggy    Medications Prior to Admission  Medication Sig Dispense Refill  .  amLODipine (NORVASC) 2.5 MG tablet Take 1 tablet (2.5 mg total) by mouth daily.  90 tablet  3  . calcium carbonate (OS-CAL) 600 MG TABS Take 600 mg by mouth daily.        . chlorthalidone (HYGROTON) 25 MG tablet Take 1 tablet (25 mg total) by mouth daily.  90 tablet  3  . diltiazem (CARDIZEM CD) 120 MG 24 hr capsule Take 1 capsule (120 mg total) by mouth 2 (two) times daily.  180 capsule  3  . LORazepam (ATIVAN) 1 MG tablet 1-2 for sleep if needed  180 tablet  3  . losartan (COZAAR) 50 MG tablet Take 1 tablet (50 mg total) by mouth daily.  90 tablet  3  . Multiple Vitamins-Minerals (CENTRUM SILVER PO) Take 1 tablet by mouth daily.        . Naproxen Sodium (ALEVE PO) Take by mouth as needed.        Marland Kitchen omeprazole (PRILOSEC) 20 MG capsule Take 1 tablet once daily thirty minutes before a meal       . potassium chloride SA (K-DUR,KLOR-CON) 20 MEQ tablet Take 20 mEq by mouth daily.        Marland Kitchen  pravastatin (PRAVACHOL) 20 MG tablet Take 20 mg by mouth at bedtime.        . quinapril (ACCUPRIL) 40 MG tablet TAKE ONE TABLET BY MOUTH IN THE MORNING AND ONE AT BEDTIME  180 tablet  3  . vitamin A 8000 UNIT capsule Take 16,000 Units by mouth daily.        . Acetaminophen (TYLENOL 8 HOUR PO) Take by mouth as needed.        . fexofenadine (ALLEGRA) 180 MG tablet Take 180 mg by mouth as needed.         Medications Prior to Admission  Medication Dose Route Frequency Provider Last Rate Last Dose  . 0.9 %  sodium chloride infusion   Intravenous Once Robet Leu, PA        Results for orders placed during the hospital encounter of 12/17/11 (from the past 48 hour(s))  APTT     Status: Normal   Collection Time   12/17/11  6:35 AM      Component Value Range Comment   aPTT 32  24 - 37 (seconds)   CBC     Status: Abnormal   Collection Time   12/17/11  6:35 AM      Component Value Range Comment   WBC 7.8  4.0 - 10.5 (K/uL)    RBC 4.60  4.22 - 5.81 (MIL/uL)    Hemoglobin 14.3  13.0 - 17.0 (g/dL)    HCT 16.1  09.6 -  04.5 (%)    MCV 85.2  78.0 - 100.0 (fL)    MCH 31.1  26.0 - 34.0 (pg)    MCHC 36.5 (*) 30.0 - 36.0 (g/dL)    RDW 40.9  81.1 - 91.4 (%)    Platelets 339  150 - 400 (K/uL)   DIFFERENTIAL     Status: Abnormal   Collection Time   12/17/11  6:35 AM      Component Value Range Comment   Neutrophils Relative 67  43 - 77 (%)    Neutro Abs 5.2  1.7 - 7.7 (K/uL)    Lymphocytes Relative 15  12 - 46 (%)    Lymphs Abs 1.2  0.7 - 4.0 (K/uL)    Monocytes Relative 13 (*) 3 - 12 (%)    Monocytes Absolute 1.0  0.1 - 1.0 (K/uL)    Eosinophils Relative 4  0 - 5 (%)    Eosinophils Absolute 0.3  0.0 - 0.7 (K/uL)    Basophils Relative 0  0 - 1 (%)    Basophils Absolute 0.0  0.0 - 0.1 (K/uL)   PROTIME-INR     Status: Normal   Collection Time   12/17/11  6:35 AM      Component Value Range Comment   Prothrombin Time 14.1  11.6 - 15.2 (seconds)    INR 1.07  0.00 - 1.49    BASIC METABOLIC PANEL     Status: Abnormal   Collection Time   12/17/11  6:54 AM      Component Value Range Comment   Sodium 129 (*) 135 - 145 (mEq/L)    Potassium 3.2 (*) 3.5 - 5.1 (mEq/L)    Chloride 92 (*) 96 - 112 (mEq/L)    CO2 28  19 - 32 (mEq/L)    Glucose, Bld 113 (*) 70 - 99 (mg/dL)    BUN 11  6 - 23 (mg/dL)    Creatinine, Ser 7.82  0.50 - 1.35 (mg/dL)    Calcium 9.7  8.4 -  10.5 (mg/dL)    GFR calc non Af Amer 89 (*) >90 (mL/min)    GFR calc Af Amer >90  >90 (mL/min)    No results found.  Review of Systems  Constitutional: Negative for fever.  HENT: Positive for tinnitus.   Cardiovascular: Negative for chest pain.  Gastrointestinal: Negative for nausea and vomiting.  Neurological: Negative for headaches.    Blood pressure 151/77, pulse 59, temperature 98 F (36.7 C), temperature source Oral, resp. rate 18, height 5\' 9"  (1.753 m), weight 204 lb (92.534 kg), SpO2 98.00%. Physical Exam  Constitutional: He is oriented to person, place, and time. He appears well-developed and well-nourished.  HENT:  Head: Normocephalic.    Eyes: EOM are normal.  Neck: Normal range of motion.  Cardiovascular: Normal rate, regular rhythm and normal heart sounds.   No murmur heard. Respiratory: Effort normal and breath sounds normal. He has no wheezes.  GI: Soft. Bowel sounds are normal. There is no tenderness.  Musculoskeletal: Normal range of motion.  Neurological: He is alert and oriented to person, place, and time.  Skin: Skin is warm.     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 Pt aware of procedure benefits and risks and agreeable to proceed. Consent signed.  Maralyn Witherell A 12/17/2011, 7:40 AM

## 2011-12-17 NOTE — Procedures (Signed)
S/P 4 vessel cerebral arteriogram  RT CFA approach  Preliminary findings  Lt DAVF fed by Lt occipital and Lt PICA

## 2011-12-18 ENCOUNTER — Other Ambulatory Visit: Payer: Self-pay | Admitting: Physician Assistant

## 2011-12-18 ENCOUNTER — Other Ambulatory Visit: Payer: Self-pay | Admitting: Radiology

## 2011-12-20 ENCOUNTER — Telehealth (HOSPITAL_COMMUNITY): Payer: Self-pay

## 2011-12-20 ENCOUNTER — Encounter (HOSPITAL_COMMUNITY): Payer: Self-pay | Admitting: Pharmacy Technician

## 2011-12-24 ENCOUNTER — Ambulatory Visit (HOSPITAL_COMMUNITY): Admission: RE | Admit: 2011-12-24 | Payer: Medicare Other | Source: Ambulatory Visit

## 2011-12-25 ENCOUNTER — Encounter (HOSPITAL_COMMUNITY): Payer: Self-pay | Admitting: Surgery

## 2011-12-25 SURGERY — Surgical Case
Anesthesia: *Unknown

## 2011-12-26 ENCOUNTER — Inpatient Hospital Stay: Admission: RE | Admit: 2011-12-26 | Payer: Self-pay | Admitting: Interventional Radiology

## 2011-12-26 ENCOUNTER — Encounter (HOSPITAL_COMMUNITY): Payer: Self-pay | Admitting: Certified Registered Nurse Anesthetist

## 2011-12-26 ENCOUNTER — Other Ambulatory Visit: Payer: Self-pay

## 2011-12-26 ENCOUNTER — Encounter (HOSPITAL_COMMUNITY): Payer: Self-pay | Admitting: *Deleted

## 2011-12-26 ENCOUNTER — Encounter (HOSPITAL_COMMUNITY)
Admission: RE | Disposition: A | Discharge status: Home / Self Care | Payer: Self-pay | Source: Ambulatory Visit | Attending: Interventional Radiology

## 2011-12-26 ENCOUNTER — Ambulatory Visit (HOSPITAL_COMMUNITY): Payer: Medicare Other | Admitting: Certified Registered Nurse Anesthetist

## 2011-12-26 ENCOUNTER — Inpatient Hospital Stay (HOSPITAL_COMMUNITY)
Admission: RE | Admit: 2011-12-26 | Discharge: 2011-12-27 | DRG: 027 | Disposition: A | Discharge status: Home / Self Care | Payer: Medicare Other | Source: Ambulatory Visit | Attending: Interventional Radiology | Admitting: Interventional Radiology

## 2011-12-26 ENCOUNTER — Ambulatory Visit (HOSPITAL_COMMUNITY)
Admission: RE | Admit: 2011-12-26 | Discharge: 2011-12-26 | Disposition: A | Discharge status: Home / Self Care | Payer: Medicare Other | Source: Ambulatory Visit | Attending: Interventional Radiology | Admitting: Interventional Radiology

## 2011-12-26 DIAGNOSIS — E785 Hyperlipidemia, unspecified: Secondary | ICD-10-CM | POA: Diagnosis present

## 2011-12-26 DIAGNOSIS — J449 Chronic obstructive pulmonary disease, unspecified: Secondary | ICD-10-CM | POA: Diagnosis present

## 2011-12-26 DIAGNOSIS — H9312 Tinnitus, left ear: Secondary | ICD-10-CM

## 2011-12-26 DIAGNOSIS — J4489 Other specified chronic obstructive pulmonary disease: Secondary | ICD-10-CM | POA: Diagnosis present

## 2011-12-26 DIAGNOSIS — H9319 Tinnitus, unspecified ear: Secondary | ICD-10-CM | POA: Diagnosis present

## 2011-12-26 DIAGNOSIS — K219 Gastro-esophageal reflux disease without esophagitis: Secondary | ICD-10-CM | POA: Diagnosis present

## 2011-12-26 DIAGNOSIS — I10 Essential (primary) hypertension: Secondary | ICD-10-CM | POA: Diagnosis not present

## 2011-12-26 DIAGNOSIS — I671 Cerebral aneurysm, nonruptured: Secondary | ICD-10-CM | POA: Diagnosis not present

## 2011-12-26 DIAGNOSIS — G4730 Sleep apnea, unspecified: Secondary | ICD-10-CM | POA: Diagnosis not present

## 2011-12-26 DIAGNOSIS — G4710 Hypersomnia, unspecified: Secondary | ICD-10-CM | POA: Diagnosis not present

## 2011-12-26 DIAGNOSIS — D333 Benign neoplasm of cranial nerves: Secondary | ICD-10-CM | POA: Diagnosis not present

## 2011-12-26 DIAGNOSIS — I770 Arteriovenous fistula, acquired: Secondary | ICD-10-CM | POA: Diagnosis not present

## 2011-12-26 HISTORY — DX: Adverse effect of unspecified anesthetic, initial encounter: T41.45XA

## 2011-12-26 LAB — BASIC METABOLIC PANEL
BUN: 10 mg/dL (ref 6–23)
CO2: 26 mEq/L (ref 19–32)
Calcium: 8.9 mg/dL (ref 8.4–10.5)
Chloride: 96 mEq/L (ref 96–112)
Creatinine, Ser: 0.76 mg/dL (ref 0.50–1.35)
GFR calc Af Amer: >90 mL/min (ref >90)
GFR calc non Af Amer: 88 mL/min — ABNORMAL LOW (ref >90)
Glucose, Bld: 92 mg/dL (ref 70–99)
Potassium: 2.9 mEq/L — ABNORMAL LOW (ref 3.5–5.1)
Sodium: 132 mEq/L — ABNORMAL LOW (ref 135–145)

## 2011-12-26 LAB — CBC
HCT: 35 % — ABNORMAL LOW (ref 39.0–52.0)
Hemoglobin: 12.5 g/dL — ABNORMAL LOW (ref 13.0–17.0)
RDW: 13.1 % (ref 11.5–15.5)
WBC: 6.2 10*3/uL (ref 4.0–10.5)

## 2011-12-26 LAB — DIFFERENTIAL
Basophils Relative: 1 % (ref 0–1)
Eosinophils Absolute: 0.3 10*3/uL (ref 0.0–0.7)
Lymphs Abs: 1.6 10*3/uL (ref 0.7–4.0)
Monocytes Absolute: 0.9 10*3/uL (ref 0.1–1.0)
Monocytes Relative: 14 % — ABNORMAL HIGH (ref 3–12)
Neutro Abs: 3.4 10*3/uL (ref 1.7–7.7)
Neutrophils Relative %: 54 % (ref 43–77)

## 2011-12-26 LAB — PROTIME-INR: INR: 1.18 (ref 0.00–1.49)

## 2011-12-26 LAB — APTT: aPTT: 29 seconds (ref 24–37)

## 2011-12-26 LAB — POCT ACTIVATED CLOTTING TIME
Activated Clotting Time: 166 seconds
Activated Clotting Time: 182 seconds
Activated Clotting Time: 182 seconds

## 2011-12-26 SURGERY — RADIOLOGY WITH ANESTHESIA
Anesthesia: General | Laterality: Left

## 2011-12-26 MED ORDER — NIMODIPINE 30 MG PO CAPS
60.0000 mg | ORAL_CAPSULE | ORAL | Status: AC
  Administered 2011-12-26: 60 mg via ORAL
  Filled 2011-12-26: qty 2

## 2011-12-26 MED ORDER — DEXTROSE 5 % IV SOLN
1.0000 g | INTRAVENOUS | Status: DC | PRN
  Administered 2011-12-26: 1 g via INTRAVENOUS

## 2011-12-26 MED ORDER — ASPIRIN 81 MG PO CHEW
243.0000 mg | CHEWABLE_TABLET | Freq: Once | ORAL | Status: AC
  Administered 2011-12-26: 243 mg via ORAL

## 2011-12-26 MED ORDER — CEFAZOLIN SODIUM 1-5 GM-% IV SOLN
INTRAVENOUS | Status: AC
  Filled 2011-12-26: qty 50

## 2011-12-26 MED ORDER — ONDANSETRON HCL 4 MG/2ML IJ SOLN: 4.0000 mg | Freq: Four times a day (QID) | INTRAMUSCULAR | Status: DC | PRN

## 2011-12-26 MED ORDER — EPHEDRINE SULFATE 50 MG/ML IJ SOLN
INTRAMUSCULAR | Status: DC | PRN
  Administered 2011-12-26: 10 mg via INTRAVENOUS

## 2011-12-26 MED ORDER — DEXAMETHASONE SODIUM PHOSPHATE 4 MG/ML IJ SOLN
INTRAMUSCULAR | Status: DC | PRN
  Administered 2011-12-26: 4 mg via INTRAVENOUS

## 2011-12-26 MED ORDER — LORAZEPAM 1 MG PO TABS
1.0000 mg | ORAL_TABLET | Freq: Every day | ORAL | Status: DC
  Administered 2011-12-26: 1 mg via ORAL
  Filled 2011-12-26: qty 1

## 2011-12-26 MED ORDER — PROTAMINE SULFATE 10 MG/ML IV SOLN
INTRAVENOUS | Status: DC | PRN
  Administered 2011-12-26: 7.5 mg via INTRAVENOUS

## 2011-12-26 MED ORDER — MENTHOL 3 MG MT LOZG
1.0000 | LOZENGE | OROMUCOSAL | Status: DC | PRN
  Administered 2011-12-26: 3 mg via ORAL
  Filled 2011-12-26: qty 9

## 2011-12-26 MED ORDER — PHENYLEPHRINE HCL 10 MG/ML IJ SOLN
20.0000 mg | INTRAVENOUS | Status: DC | PRN
  Administered 2011-12-26: 50 ug/min via INTRAVENOUS

## 2011-12-26 MED ORDER — PROPOFOL 10 MG/ML IV EMUL
INTRAVENOUS | Status: DC | PRN
  Administered 2011-12-26: 100 mg via INTRAVENOUS

## 2011-12-26 MED ORDER — DROPERIDOL 2.5 MG/ML IJ SOLN
INTRAMUSCULAR | Status: DC | PRN
  Administered 2011-12-26: 0.625 mg via INTRAVENOUS

## 2011-12-26 MED ORDER — IOHEXOL 300 MG/ML  SOLN
250.0000 mL | Freq: Once | INTRAMUSCULAR | Status: AC | PRN
  Administered 2011-12-26: 130 mL via INTRA_ARTERIAL

## 2011-12-26 MED ORDER — CEFAZOLIN SODIUM 1-5 GM-% IV SOLN: 1.0000 g | Freq: Once | INTRAVENOUS | Status: DC

## 2011-12-26 MED ORDER — HEPARIN SODIUM (PORCINE) 1000 UNIT/ML IJ SOLN
INTRAMUSCULAR | Status: DC | PRN
  Administered 2011-12-26: 500 [IU] via INTRAVENOUS
  Administered 2011-12-26: 1000 [IU] via INTRAVENOUS
  Administered 2011-12-26: 3000 [IU] via INTRAVENOUS
  Administered 2011-12-26 (×3): 500 [IU] via INTRAVENOUS

## 2011-12-26 MED ORDER — LIDOCAINE HCL (CARDIAC) 20 MG/ML IV SOLN
INTRAVENOUS | Status: DC | PRN
  Administered 2011-12-26: 60 mg via INTRAVENOUS

## 2011-12-26 MED ORDER — NEOSTIGMINE METHYLSULFATE 1 MG/ML IJ SOLN
INTRAMUSCULAR | Status: DC | PRN
  Administered 2011-12-26: 3 mg via INTRAVENOUS

## 2011-12-26 MED ORDER — CEFAZOLIN SODIUM-DEXTROSE 2-3 GM-% IV SOLR
2.0000 g | Freq: Once | INTRAVENOUS | Status: DC
  Filled 2011-12-26: qty 50

## 2011-12-26 MED ORDER — LACTATED RINGERS IV SOLN
INTRAVENOUS | Status: DC | PRN
  Administered 2011-12-26 (×2): via INTRAVENOUS

## 2011-12-26 MED ORDER — ACETAMINOPHEN 500 MG PO TABS: 1000.0000 mg | ORAL_TABLET | Freq: Four times a day (QID) | ORAL | Status: DC | PRN

## 2011-12-26 MED ORDER — SODIUM CHLORIDE 0.9 % IJ SOLN
1.5000 mg | INTRAVENOUS | Status: DC
  Filled 2011-12-26: qty 0.3

## 2011-12-26 MED ORDER — SUFENTANIL CITRATE 50 MCG/ML IV SOLN
INTRAVENOUS | Status: DC | PRN
  Administered 2011-12-26: 25 ug via INTRAVENOUS

## 2011-12-26 MED ORDER — LIDOCAINE HCL 4 % MT SOLN
OROMUCOSAL | Status: DC | PRN
  Administered 2011-12-26: 4 mL via TOPICAL

## 2011-12-26 MED ORDER — ASPIRIN 81 MG PO CHEW
CHEWABLE_TABLET | ORAL | Status: AC
  Filled 2011-12-26: qty 3

## 2011-12-26 MED ORDER — HYDROMORPHONE HCL PF 1 MG/ML IJ SOLN: 0.2500 mg | INTRAMUSCULAR | Status: DC | PRN

## 2011-12-26 MED ORDER — ROCURONIUM BROMIDE 100 MG/10ML IV SOLN
INTRAVENOUS | Status: DC | PRN
  Administered 2011-12-26: 50 mg via INTRAVENOUS

## 2011-12-26 MED ORDER — SODIUM CHLORIDE 0.9 % IV SOLN
INTRAVENOUS | Status: AC
  Administered 2011-12-26: 17:00:00 via INTRAVENOUS

## 2011-12-26 MED ORDER — ONDANSETRON HCL 4 MG/2ML IJ SOLN: 4.0000 mg | Freq: Once | INTRAMUSCULAR | Status: DC | PRN

## 2011-12-26 MED ORDER — ONDANSETRON HCL 4 MG/2ML IJ SOLN
INTRAMUSCULAR | Status: DC | PRN
  Administered 2011-12-26: 4 mg via INTRAVENOUS

## 2011-12-26 MED ORDER — ACETAMINOPHEN 650 MG RE SUPP: 650.0000 mg | Freq: Four times a day (QID) | RECTAL | Status: DC | PRN

## 2011-12-26 MED ORDER — PANTOPRAZOLE SODIUM 40 MG PO TBEC
40.0000 mg | DELAYED_RELEASE_TABLET | Freq: Every day | ORAL | Status: DC
  Administered 2011-12-26 – 2011-12-27 (×2): 40 mg via ORAL
  Filled 2011-12-26 (×2): qty 1

## 2011-12-26 MED ORDER — GLYCOPYRROLATE 0.2 MG/ML IJ SOLN
INTRAMUSCULAR | Status: DC | PRN
  Administered 2011-12-26 (×2): 0.2 mg via INTRAVENOUS
  Administered 2011-12-26: 0.4 mg via INTRAVENOUS
  Administered 2011-12-26: 0.2 mg via INTRAVENOUS

## 2011-12-26 MED ORDER — CHLORTHALIDONE 25 MG PO TABS
25.0000 mg | ORAL_TABLET | Freq: Every day | ORAL | Status: DC
  Administered 2011-12-26 – 2011-12-27 (×2): 25 mg via ORAL
  Filled 2011-12-26 (×2): qty 1

## 2011-12-26 MED ORDER — NICARDIPINE HCL IN NACL 20-0.86 MG/200ML-% IV SOLN
5.0000 mg/h | INTRAVENOUS | Status: DC
  Administered 2011-12-26: 5 mg/h via INTRAVENOUS
  Administered 2011-12-27 (×2): 6 mg/h via INTRAVENOUS
  Filled 2011-12-26 (×4): qty 200

## 2011-12-26 MED ORDER — SODIUM CHLORIDE 0.9 % IV SOLN: INTRAVENOUS | Status: DC

## 2011-12-26 MED ORDER — ASPIRIN EC 325 MG PO TBEC
325.0000 mg | DELAYED_RELEASE_TABLET | ORAL | Status: DC
  Filled 2011-12-26: qty 1

## 2011-12-26 NOTE — Progress Notes (Signed)
eLink Physician-Brief Progress Note Patient Name: DRAVEN NATTER DOB: Nov 04, 1934 MRN: [id number]  Date of Service  12/26/2011   HPI/Events of Note     eICU Interventions  SCds for DVt proph   Intervention Category Intermediate Interventions: Best-practice therapies (e.g. DVT, beta blocker, etc.)  ALVA,RAKESH V. 12/26/2011, 5:52 PM

## 2011-12-26 NOTE — Transfer of Care (Signed)
Immediate Anesthesia Transfer of Care Note  Patient: Julian Fowler  Procedure(s) Performed: Procedure(s) (LRB): RADIOLOGY WITH ANESTHESIA (Left)  Patient Location: PACU  Anesthesia Type: General  Level of Consciousness: oriented, sedated, patient cooperative and responds to stimulation  Airway & Oxygen Therapy: Patient Spontanous Breathing and Patient connected to nasal cannula oxygen  Post-op Assessment: Report given to PACU RN, Post -op Vital signs reviewed and stable, Patient moving all extremities X 4 and Patient able to stick tongue midline  Post vital signs: Reviewed and stable  Complications: No apparent anesthesia complications

## 2011-12-26 NOTE — Progress Notes (Signed)
Chaplain note:  Chaplain introduced Julian Fowler to pt and was invited into the room.  Pt was awake and resting in bed.  Pt's family was at bedside.   Chaplain provided spiritual comfort and support for pt and family.  Pt and family appreciated chaplain support.  Chaplain will follow up if requested.   12/26/11 1600  Clinical Encounter Type  Visited With Patient and family together  Visit Type Initial;Spiritual support  Spiritual Encounters  Spiritual Needs Emotional  Stress Factors  Patient Stress Factors Health changes  Family Stress Factors None identified    Verdie Shire, chaplain resident [phone]

## 2011-12-26 NOTE — Progress Notes (Signed)
  Subjective:  Post procedure  Fully awake ,alert C/O feeling cold . Otherwise denies any H/A ,N?V visual, motor,sensory or speech difficulties. Able  To tolerate liquids.. Denies any breathing problems or chest pains. Denies left sided tinnitus.  Objective: Vital signs in last 24 hours: Temp:  [97.5 F (36.4 C)-98.1 F (36.7 C)] 97.5 F (36.4 C) (03/13 1534) Pulse Rate:  [46-54] 54  (03/13 1630) Resp:  [12-39] 13  (03/13 1630) BP: (105-143)/(50-70) 129/63 mmHg (03/13 1630) SpO2:  [96 %-100 %] 100 % (03/13 1630) Arterial Line BP: (130-168)/(45-66) 168/66 mmHg (03/13 1630)    Intake/Output from previous day:   Intake/Output this shift: Total I/O In: -  Out: 1950 [Urine:1850; Blood:100]  On examination. VS BP 125/65,HR 60s to 70s SR,PaO2 100% on 2L O2 [place].  Neuro.. Alert, awake oriented to time ,place and space.Marland Kitchen  Speech and comprehension clear.Marland Kitchen  PEARLA..3mm R=L  EOMS full.Jill Alexand[place]. Full to confrontation.  No Facial asymmetry.  Tongue Midline..  Motor..           NO drift of outstretched arms..          Power.5/5 Proximally and distally all four extremities..  Fine motor and coordination to finger to nose equal..  Gait   Not tested   Romberg Not tested.  Heel to toe. Not tested. Lt groin soft . Pulses 2+  Lab Results:   [facility] 12/26/11 0738  WBC 6.2  HGB 12.5*  HCT 35.0*  PLT 359   BMET  Basename 12/26/11 0738  NA 132*  K 2.9*  CL 96  CO2 26  GLUCOSE 92  BUN 10  CREATININE 0.76  CALCIUM 8.9   PT/INR  Basename 12/26/11 0738  LABPROT 15.2  INR 1.18   ABG No results found for this basename: PHART:2,PCO2:2,PO2:2,HCO3:2 in the last 72 hours  Studies/Results: No results found.  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 PLAN. 1. Advance diet as tolerated. 2.Closr neuro checks and BP monitoring, with IV cardene  To maintain BP ,using cuff pressures.. 3. Discussed with patient and family.  Kimiye Strathman K 12/26/2011

## 2011-12-26 NOTE — H&P (Signed)
Julian Fowler is an 77 y.o. male.   Chief Complaint: left ear tinnitus.  HPI: Roaring pulsatile tinnitus on the left, constant in nature that has progressed over the last year.  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  He presents today for angiogram with embolization.   Past Medical History  Diagnosis Date  . Anxiety     codependent relationship with irresponsible son  . Depression   . GERD (gastroesophageal reflux disease)   . Hyperlipidemia   . Hypertension December 29, 2009    renal artery Doppler , normal  12/2009  . Abdominal aorta injury     Normal size, ultrasound, March 17,2011  . Asbestos exposure     Hx of asbestos related pleural plaques  . Overactive bladder   . ED (erectile dysfunction)   . Arthritis   . Hearing loss in left ear   . Baker's cyst     Left leg  . SVT (supraventricular tachycardia)     AVNRT, AV nodal reentry tachycardia  . Allergic rhinitis   . OSA (obstructive sleep apnea)     moderate -NPSG 03/02/08 AHI 23 cpap 8..  . Syncope December 2010    09/2009  felt to be vasovagal or micturition syncope  . Chest pain     nuclear, 10/2007, normal  . Ejection fraction     60%, echo, 2008, mild RV dysfunction  . Easy bruisability   . Complication of anesthesia     pt reports that he does not want to wake up    Past Surgical History  Procedure Date  . Cholecystectomy 6/98  . Craniectomy for excision of acoustic neuroma 3/95  . Trigger finger release 2003    (thumb) middle finger (2006)    Family History  Problem Relation Age of Onset  . Cancer Father     oral cancer  . Breast cancer Mother   . Heart attack Mother   . Hypertension Sister     Bypass x4  . Alcohol abuse Sister   . Alzheimer's disease Sister   . Kidney disease Sister    Social History:  reports that he quit smoking about 25 years ago. His smoking use included Cigarettes. He has a 45 pack-year smoking history. He has never used smokeless tobacco.  He reports that he does not drink alcohol or use illicit drugs.  Allergies:  Allergies  Allergen Reactions  . Codeine     REACTION: groggy    Medications Prior to Admission  Medication Dose Route Frequency Provider Last Rate Last Dose  . DISCONTD: ceFAZolin (ANCEF) IVPB 2 g/50 mL premix  2 g Intravenous Once Hilario Quarry Amend, PHARMD       Medications Prior to Admission  Medication Sig Dispense Refill  . acetaminophen (TYLENOL) 325 MG tablet Take 325 mg by mouth every 6 (six) hours as needed. For pain      . calcium carbonate (OS-CAL) 600 MG TABS Take 600 mg by mouth daily.        . fexofenadine (ALLEGRA) 180 MG tablet Take 180 mg by mouth daily as needed. For allergies      . LORazepam (ATIVAN) 1 MG tablet Take 1-2 mg by mouth at bedtime as needed. For sleep      . Multiple Vitamins-Minerals (CENTRUM SILVER PO) Take 1 tablet by mouth daily.        . naproxen sodium (ANAPROX) 220 MG tablet Take 220 mg by mouth daily as needed. For pain      .  omeprazole (PRILOSEC) 20 MG capsule Take 20 mg by mouth daily. Take 1 tablet once daily thirty minutes before a meal      . potassium chloride SA (K-DUR,KLOR-CON) 20 MEQ tablet Take 20 mEq by mouth daily.       . pravastatin (PRAVACHOL) 20 MG tablet Take 20 mg by mouth at bedtime.        . quinapril (ACCUPRIL) 40 MG tablet Take 40 mg by mouth 2 (two) times daily.      . vitamin A 8000 UNIT capsule Take 16,000 Units by mouth daily.           Review of Systems  Constitutional: Negative for fever and chills.  HENT: Positive for hearing loss and tinnitus.        Left ear tinnitus with decreased hearing.   Eyes: Negative for blurred vision and double vision.  Respiratory: Positive for cough. Negative for sputum production, shortness of breath and wheezing.   Gastrointestinal: Positive for heartburn. Negative for nausea, vomiting and abdominal pain.  Genitourinary: Negative.   Musculoskeletal: Negative.   Neurological: Positive for dizziness.  Negative for tremors, speech change, focal weakness, seizures and loss of consciousness.       Intermittent dizziness with veering to the left  Psychiatric/Behavioral: Negative for depression and memory loss. The patient is not nervous/anxious and does not have insomnia.     Blood pressure 143/70, pulse 49, temperature 98.1 F (36.7 C), temperature source Oral, resp. rate 18, SpO2 97.00%. Physical Exam  Constitutional: He is oriented to person, place, and time. He appears well-developed and well-nourished. No distress.  HENT:  Head: Normocephalic and atraumatic.       Left ear tinnitus    Eyes: EOM are normal. Pupils are equal, round, and reactive to light. Right eye exhibits no discharge.  Neck: Normal range of motion.  Cardiovascular: Normal rate, regular rhythm and intact distal pulses.  Exam reveals no gallop and no friction rub.   Murmur heard. Respiratory: Effort normal and breath sounds normal. No respiratory distress. He has no wheezes. He has no rales. He exhibits no tenderness.  GI: Soft. Bowel sounds are normal. He exhibits no distension.  Musculoskeletal: Normal range of motion. He exhibits tenderness. He exhibits no edema.       Tender to right groin with small soft hematoma and ecchymosis from prior angiogram.  Two small incisions noted   Neurological: He is alert and oriented to person, place, and time. No cranial nerve deficit. Coordination normal.  Skin: Skin is warm and dry. He is not diaphoretic.  Psychiatric: He has a normal mood and affect. His behavior is normal. Judgment and thought content normal.     Assessment/Plan Patient has been seen in consultation for embolization of dural fistula under general anesthesia.  Patient and family have no additional questions today.  Written consent obtained.  Labs currently pending at time of examination - plan to proceed at this time to the OR area for anesthesia assessment and then embolization under general anesthesia in  neuro IR.    Barnabas Henriques D 12/26/2011, 7:58 AM

## 2011-12-26 NOTE — Anesthesia Preprocedure Evaluation (Addendum)
Anesthesia Evaluation  Patient identified by MRN, date of birth, ID band Patient awake    Reviewed: Allergy & Precautions, H&P , NPO status , Patient's Chart, lab work & pertinent test results  Airway       Dental   Pulmonary          Cardiovascular hypertension, Pt. on medications     Neuro/Psych    GI/Hepatic negative GI ROS, Neg liver ROS,   Endo/Other  negative endocrine ROS  Renal/GU negative Renal ROS  negative genitourinary   Musculoskeletal negative musculoskeletal ROS (+)   Abdominal   Peds  Hematology negative hematology ROS (+)   Anesthesia Other Findings   Reproductive/Obstetrics                           Anesthesia Physical Anesthesia Plan  ASA: III  Anesthesia Plan: General   Post-op Pain Management:    Induction: Cricoid pressure planned  Airway Management Planned: Oral ETT  Additional Equipment: Arterial line  Intra-op Plan:   Post-operative Plan: Extubation in OR  Informed Consent: I have reviewed the patients History and Physical, chart, labs and discussed the procedure including the risks, benefits and alternatives for the proposed anesthesia with the patient or authorized representative who has indicated his/her understanding and acceptance.   Dental advisory given  Plan Discussed with: CRNA, Anesthesiologist and Surgeon  Anesthesia Plan Comments:         Anesthesia Quick Evaluation

## 2011-12-26 NOTE — Procedures (Signed)
S/P Lt CCA ,LT ECA and Lt vertebral artery arteriogram Followed by endovascular embolization of lt occipital  Artery  branches  With coils and Onyx

## 2011-12-26 NOTE — Anesthesia Postprocedure Evaluation (Signed)
  Anesthesia Post-op Note  Patient: Julian Fowler  Procedure(s) Performed: Procedure(s) (LRB): RADIOLOGY WITH ANESTHESIA (Left)  Patient Location: PACU  Anesthesia Type: General  Level of Consciousness: awake, oriented, sedated and patient cooperative  Airway and Oxygen Therapy: Patient Spontanous Breathing and Patient connected to nasal cannula oxygen  Post-op Pain: none  Post-op Assessment: Post-op Vital signs reviewed, Patient's Cardiovascular Status Stable, Respiratory Function Stable, Patent Airway, No signs of Nausea or vomiting and Pain level controlled  Post-op Vital Signs: stable  Complications: No apparent anesthesia complications

## 2011-12-27 DIAGNOSIS — I770 Arteriovenous fistula, acquired: Secondary | ICD-10-CM | POA: Diagnosis not present

## 2011-12-27 LAB — CBC
HCT: 35.5 % — ABNORMAL LOW (ref 39.0–52.0)
Hemoglobin: 12.9 g/dL — ABNORMAL LOW (ref 13.0–17.0)
MCH: 30.6 pg (ref 26.0–34.0)
MCV: 84.3 fL (ref 78.0–100.0)
RBC: 4.21 MIL/uL — ABNORMAL LOW (ref 4.22–5.81)

## 2011-12-27 LAB — DIFFERENTIAL
Eosinophils Absolute: 0 10*3/uL (ref 0.0–0.7)
Eosinophils Relative: 0 % (ref 0–5)
Lymphs Abs: 1.3 10*3/uL (ref 0.7–4.0)
Monocytes Relative: 8 % (ref 3–12)

## 2011-12-27 NOTE — Discharge Summary (Signed)
Physician Discharge Summary  Patient ID: Julian Fowler MRN: [id number] DOB/AGE: 03/18/1935 77 y.o.  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 Ermalinda Barrios for further evaluation and possible treatment of a dural AV fistula. The patient presented with symptoms of left-sided tinnitus and hearing loss. Patient has a history of previous resection of an acoustic neuroma on the left side in 1995. Cerebral arteriography on 12/17/2011 revealed a dural branch from the distal one third of the left occipital artery with multiple distal branches from the left posterior inferior cerebellar artery supplying a dural AV fistula, which drain via a prominent vein to numerous cerebellar cortical veins. On 12/26/2011 the patient underwent left common carotid artery, left external carotid artery and left vertebral artery arteriogram followed by endovascular embolization of left occipital artery branches of  the dural AV fistula with coils and Onyx. The procedure was performed under general anesthesia. The patient tolerated the procedure well and was admitted to the neuro intensive care unit for overnight observation . The patient did well overnight with exception of some mild reflux symptoms which improved with Prilosec. The patient continued to report some mild pulsatile tinnitus on the left. He denied any acute neurological problems. Patient tolerated his diet, was able to void without difficulty and ambulated without difficulty. He was discharged home in stable condition with followup scheduled in 2 weeks  with Dr. Corliss Skains.  Consults: none  Significant Diagnostic Studies: Cerebral arteriography dated 12/17/2011 revealing a dural branch from the distal one third of the left occipital artery, with multiple distal branches of the left posterior-inferior cerebellar artery supplying a dural AV fistula, which drain via a prominent vein to numerous cerebellar cortical veins. Left common carotid artery, left external carotid artery, left vertebral artery arteriogram on 12/26/2011 followed by endovascular embolization of left occipital artery branches with coils and Onyx.  Treatments: Endovascular embolization of the left occipital artery branches of the dural AV fistula with coils and Onyx  Discharge Exam: Blood pressure 141/76, pulse 77, temperature 98.1 F (36.7 C), temperature source Oral, resp. rate 21, height 5\' 10"  (1.778 m), weight 207 lb 3.7 oz (94 kg), SpO2 98.00%. Patient is awake alert and oriented x3, chest is clear to auscultation bilaterally, heart with regular rate and rhythm without murmur, abdomen soft, nontender, positive bowel sounds, left groin puncture site clean, dry, minimal tenderness to palpation, no hematoma noted, soft ; intact distal pulses. Speech normal, tongue midline, no drift, face symmetrical, pupils equal round reactive to light, extraocular movements intact, strength 5/5 all fours, sensation intact all fours, finger to nose and fine motor movements normal Results for orders placed during the hospital encounter of 12/26/11  CBC      Component Value Range   WBC 11.2 (*) 4.0 - 10.5 (K/uL)   RBC 4.21 (*) 4.22 - 5.81 (MIL/uL)   Hemoglobin 12.9 (*) 13.0 - 17.0 (g/dL)   HCT 40.9 (*) 81.1 - 52.0 (%)   MCV 84.3  78.0 - 100.0 (fL)   MCH 30.6  26.0 - 34.0 (pg)   MCHC 36.3 (*) 30.0 - 36.0 (g/dL)   RDW 91.4  78.2 - 95.6 (%)   Platelets 400  150 -  400 (K/uL)  DIFFERENTIAL      Component Value Range   Neutrophils Relative 81 (*) 43 - 77 (%)   Neutro Abs 9.0 (*) 1.7 - 7.7 (K/uL)     Lymphocytes Relative 11 (*) 12 - 46 (%)   Lymphs Abs 1.3  0.7 - 4.0 (K/uL)   Monocytes Relative 8  3 - 12 (%)   Monocytes Absolute 0.9  0.1 - 1.0 (K/uL)   Eosinophils Relative 0  0 - 5 (%)   Eosinophils Absolute 0.0  0.0 - 0.7 (K/uL)   Basophils Relative 0  0 - 1 (%)   Basophils Absolute 0.0  0.0 - 0.1 (K/uL)    Disposition: home; patient to followup in neuro interventional radiology clinic with Dr. Corliss Skains in 2 weeks; patient was told to avoid driving for the next 2 weeks, avoid any strenuous activity, heavy lifting for the next 2 weeks.The patient was told to contact our service with any further questions or concerns.  Discharge Orders    Future Appointments: Provider: Department: Dept Phone: Center:   07/29/2012 9:30 AM Waymon Budge, MD [facility] [phone] None     Future Orders Please Complete By Expires   Diet - low sodium heart healthy      Increase activity slowly      Remove dressing in 24 hours      Scheduling Instructions:   May place gauze bandage to groin site with neosporin for next 2-3 days   Call MD for:  temperature >100.4      Call MD for:  persistant nausea and vomiting      Call MD for:  severe uncontrolled pain      Call MD for:  redness, tenderness, or signs of infection (pain, swelling, redness, odor or green/yellow discharge around incision site)      Call MD for:  difficulty breathing, headache or visual disturbances      Call MD for:  persistant dizziness or light-headedness        Medication List  As of 12/27/2011 12:18 PM   TAKE these medications         acetaminophen 325 MG tablet   Commonly known as: TYLENOL   Take 325 mg by mouth every 6 (six) hours as needed. For pain      amLODipine 2.5 MG tablet   Commonly known as: NORVASC   Take 2.5 mg by mouth daily.      aspirin 81 MG tablet   Take 81 mg by mouth daily.      calcium carbonate 600 MG Tabs   Commonly known as: OS-CAL   Take 600 mg by mouth daily.      CENTRUM SILVER PO    Take 1 tablet by mouth daily.      chlorthalidone 25 MG tablet   Commonly known as: HYGROTON   Take 25 mg by mouth daily.      diltiazem 120 MG 24 hr capsule   Commonly known as: CARDIZEM CD   Take 120 mg by mouth 2 (two) times daily.      fexofenadine 180 MG tablet   Commonly known as: ALLEGRA   Take 180 mg by mouth daily as needed. For allergies      LORazepam 1 MG tablet   Commonly known as: ATIVAN   Take 1-2 mg by mouth at bedtime as needed. For sleep      losartan 50 MG tablet   Commonly known as: COZAAR   Take 50 mg by  mouth daily.      naproxen sodium 220 MG tablet   Commonly known as: ANAPROX   Take 220 mg by mouth daily as needed. For pain      omeprazole 20 MG capsule   Commonly known as: PRILOSEC   Take 20 mg by mouth daily. Take 1 tablet once daily thirty minutes before a meal      potassium chloride SA 20 MEQ tablet   Commonly known as: K-DUR,KLOR-CON   Take 20 mEq by mouth daily.      pravastatin 20 MG tablet   Commonly known as: PRAVACHOL   Take 20 mg by mouth at bedtime.      quinapril 40 MG tablet   Commonly known as: ACCUPRIL   Take 40 mg by mouth 2 (two) times daily.      vitamin A 8000 UNIT capsule   Take 16,000 Units by mouth daily.           Follow-up Information    Follow up with Oneal Grout, MD. (xray will call you with follow up appointment with Dr. Corliss Skains in 2 weeks; call [phone] with any questions)    Contact information:   [address] [place] [address] [phone]          Signed: Chinita Pester 12/27/2011, 12:18 PM

## 2011-12-27 NOTE — Progress Notes (Signed)
Chart reviewed, CM following for progression and d/c planning.  Johny Shock RN MPH Case Manager [phone]

## 2011-12-27 NOTE — Discharge Instructions (Signed)
No driving for next 2 weeks; no heavy lifting /strenuous activity for next 2 weeks

## 2011-12-31 ENCOUNTER — Other Ambulatory Visit (HOSPITAL_COMMUNITY): Payer: Self-pay | Admitting: Interventional Radiology

## 2011-12-31 DIAGNOSIS — I671 Cerebral aneurysm, nonruptured: Secondary | ICD-10-CM

## 2012-01-03 ENCOUNTER — Ambulatory Visit (HOSPITAL_COMMUNITY): Payer: Medicare Other

## 2012-01-03 ENCOUNTER — Telehealth (HOSPITAL_COMMUNITY): Payer: Self-pay

## 2012-01-03 ENCOUNTER — Other Ambulatory Visit (HOSPITAL_COMMUNITY): Payer: Self-pay | Admitting: Interventional Radiology

## 2012-01-03 DIAGNOSIS — H93A9 Pulsatile tinnitus, unspecified ear: Secondary | ICD-10-CM

## 2012-01-03 NOTE — Telephone Encounter (Signed)
Pt called this morning and wanted to speak with Dr. Corliss Skains.  He was concerned with the noise returning and the knot on the side of his head.  Dr. Corliss Skains advised him to come in.  He arrived at 3pm.  Dr. Corliss Skains spent 1:23 with him.  He will call next week to let us know how he is feeling.

## 2012-01-09 ENCOUNTER — Ambulatory Visit (HOSPITAL_COMMUNITY): Payer: Medicare Other | Attending: Interventional Radiology

## 2012-01-10 ENCOUNTER — Telehealth (HOSPITAL_COMMUNITY): Payer: Self-pay

## 2012-01-10 NOTE — Telephone Encounter (Signed)
Mr. Elison called in this morning wanting to speak with Dr. Corliss Skains at 750.  I advised him that I would give the message to Dr. Corliss Skains.  Mr. Schreier called back a 2nd time demanding to speak with Dr. Corliss Skains.  I advised him that Dr. Corliss Skains has the message but has 3 procedures he is working on.  I gave the message to Michael Litter as well.  Mr. Orndoff called in a 3rd time.  I gave the message to Dr. Corliss Skains along with Rinaldo Cloud.  Mr. Lambert called back at 27 stating that he desperately needed to speak with Dr. Corliss Skains before the end of the day.  I took another message to Dr. Corliss Skains

## 2012-01-13 ENCOUNTER — Telehealth (HOSPITAL_COMMUNITY): Payer: Self-pay | Admitting: Radiology

## 2012-01-15 DIAGNOSIS — H9319 Tinnitus, unspecified ear: Secondary | ICD-10-CM | POA: Diagnosis not present

## 2012-01-15 DIAGNOSIS — J309 Allergic rhinitis, unspecified: Secondary | ICD-10-CM | POA: Diagnosis not present

## 2012-01-15 DIAGNOSIS — I10 Essential (primary) hypertension: Secondary | ICD-10-CM | POA: Diagnosis not present

## 2012-01-15 DIAGNOSIS — J019 Acute sinusitis, unspecified: Secondary | ICD-10-CM | POA: Diagnosis not present

## 2012-01-28 ENCOUNTER — Telehealth (HOSPITAL_COMMUNITY): Payer: Self-pay

## 2012-01-28 NOTE — Telephone Encounter (Signed)
Left a message for pt stating that Dr. Corliss Skains stated that he can sty within his on personal limits as far as pushing himself during his exercise routines

## 2012-01-29 DIAGNOSIS — H4011X0 Primary open-angle glaucoma, stage unspecified: Secondary | ICD-10-CM | POA: Diagnosis not present

## 2012-01-29 DIAGNOSIS — H409 Unspecified glaucoma: Secondary | ICD-10-CM | POA: Diagnosis not present

## 2012-01-29 DIAGNOSIS — H11159 Pinguecula, unspecified eye: Secondary | ICD-10-CM | POA: Diagnosis not present

## 2012-01-30 DIAGNOSIS — S8010XA Contusion of unspecified lower leg, initial encounter: Secondary | ICD-10-CM | POA: Diagnosis not present

## 2012-02-05 DIAGNOSIS — E119 Type 2 diabetes mellitus without complications: Secondary | ICD-10-CM | POA: Diagnosis not present

## 2012-02-05 DIAGNOSIS — E7800 Pure hypercholesterolemia, unspecified: Secondary | ICD-10-CM | POA: Diagnosis not present

## 2012-02-05 DIAGNOSIS — I10 Essential (primary) hypertension: Secondary | ICD-10-CM | POA: Diagnosis not present

## 2012-02-12 DIAGNOSIS — E782 Mixed hyperlipidemia: Secondary | ICD-10-CM | POA: Diagnosis not present

## 2012-02-12 DIAGNOSIS — S8010XA Contusion of unspecified lower leg, initial encounter: Secondary | ICD-10-CM | POA: Diagnosis not present

## 2012-02-12 DIAGNOSIS — N400 Enlarged prostate without lower urinary tract symptoms: Secondary | ICD-10-CM | POA: Diagnosis not present

## 2012-02-12 DIAGNOSIS — R7301 Impaired fasting glucose: Secondary | ICD-10-CM | POA: Diagnosis not present

## 2012-02-12 DIAGNOSIS — J309 Allergic rhinitis, unspecified: Secondary | ICD-10-CM | POA: Diagnosis not present

## 2012-02-12 DIAGNOSIS — H9319 Tinnitus, unspecified ear: Secondary | ICD-10-CM | POA: Diagnosis not present

## 2012-02-12 DIAGNOSIS — I10 Essential (primary) hypertension: Secondary | ICD-10-CM | POA: Diagnosis not present

## 2012-02-12 DIAGNOSIS — E871 Hypo-osmolality and hyponatremia: Secondary | ICD-10-CM | POA: Diagnosis not present

## 2012-03-09 ENCOUNTER — Other Ambulatory Visit: Payer: Self-pay | Admitting: Internal Medicine

## 2012-03-12 ENCOUNTER — Telehealth (HOSPITAL_COMMUNITY): Payer: Self-pay

## 2012-03-12 ENCOUNTER — Other Ambulatory Visit (HOSPITAL_COMMUNITY): Payer: Self-pay | Admitting: Interventional Radiology

## 2012-03-12 DIAGNOSIS — H93A9 Pulsatile tinnitus, unspecified ear: Secondary | ICD-10-CM

## 2012-03-12 NOTE — Telephone Encounter (Signed)
Per Dr. Corliss Skains I have scheduled a consult for Julian Fowler for June 5th @ 2pm to discuss next step (per the F/u schedule he is to be scheduled for the f/u angio in July)

## 2012-03-12 NOTE — Telephone Encounter (Signed)
Last OV:07-26-2011 due back in 07-2012; Please advise if okay to refill. Thanks.

## 2012-03-12 NOTE — Telephone Encounter (Signed)
Mr Capano called and left a vm stating that he wanted to schedule with Dr. Algis Downs

## 2012-03-13 ENCOUNTER — Telehealth (HOSPITAL_COMMUNITY): Payer: Self-pay

## 2012-03-13 NOTE — Telephone Encounter (Signed)
Spoke with Julian Fowler.  He advised that the appt time was great.  He and his daughter will be here

## 2012-03-13 NOTE — Telephone Encounter (Signed)
Ok to refill 

## 2012-03-19 ENCOUNTER — Other Ambulatory Visit: Payer: Self-pay | Admitting: *Deleted

## 2012-03-19 ENCOUNTER — Other Ambulatory Visit (HOSPITAL_COMMUNITY): Payer: Self-pay | Admitting: Interventional Radiology

## 2012-03-19 ENCOUNTER — Ambulatory Visit (HOSPITAL_COMMUNITY)
Admission: RE | Admit: 2012-03-19 | Discharge: 2012-03-19 | Disposition: A | Discharge status: Home / Self Care | Payer: Medicare Other | Source: Ambulatory Visit | Attending: Interventional Radiology | Admitting: Interventional Radiology

## 2012-03-19 DIAGNOSIS — H9319 Tinnitus, unspecified ear: Secondary | ICD-10-CM | POA: Diagnosis not present

## 2012-03-19 DIAGNOSIS — I671 Cerebral aneurysm, nonruptured: Secondary | ICD-10-CM

## 2012-03-19 DIAGNOSIS — H93A9 Pulsatile tinnitus, unspecified ear: Secondary | ICD-10-CM

## 2012-03-19 DIAGNOSIS — H9312 Tinnitus, left ear: Secondary | ICD-10-CM

## 2012-03-19 MED ORDER — POTASSIUM CHLORIDE CRYS ER 20 MEQ PO TBCR: 20.0000 meq | EXTENDED_RELEASE_TABLET | Freq: Every day | ORAL | Status: DC

## 2012-04-01 ENCOUNTER — Encounter (HOSPITAL_COMMUNITY): Payer: Self-pay | Admitting: Respiratory Therapy

## 2012-04-01 ENCOUNTER — Other Ambulatory Visit: Payer: Self-pay | Admitting: Physician Assistant

## 2012-04-02 ENCOUNTER — Other Ambulatory Visit: Payer: Self-pay | Admitting: Radiology

## 2012-04-03 ENCOUNTER — Encounter (HOSPITAL_COMMUNITY)
Admission: RE | Admit: 2012-04-03 | Discharge: 2012-04-03 | Disposition: A | Discharge status: Home / Self Care | Payer: Medicare Other | Source: Ambulatory Visit | Attending: Interventional Radiology | Admitting: Interventional Radiology

## 2012-04-03 ENCOUNTER — Encounter (HOSPITAL_COMMUNITY): Payer: Self-pay

## 2012-04-03 DIAGNOSIS — I671 Cerebral aneurysm, nonruptured: Secondary | ICD-10-CM | POA: Diagnosis not present

## 2012-04-03 DIAGNOSIS — E785 Hyperlipidemia, unspecified: Secondary | ICD-10-CM | POA: Diagnosis not present

## 2012-04-03 DIAGNOSIS — I10 Essential (primary) hypertension: Secondary | ICD-10-CM | POA: Diagnosis not present

## 2012-04-03 DIAGNOSIS — H9319 Tinnitus, unspecified ear: Secondary | ICD-10-CM | POA: Diagnosis not present

## 2012-04-03 DIAGNOSIS — Z9889 Other specified postprocedural states: Secondary | ICD-10-CM | POA: Diagnosis not present

## 2012-04-03 DIAGNOSIS — Z01812 Encounter for preprocedural laboratory examination: Secondary | ICD-10-CM | POA: Diagnosis not present

## 2012-04-03 DIAGNOSIS — N400 Enlarged prostate without lower urinary tract symptoms: Secondary | ICD-10-CM | POA: Diagnosis not present

## 2012-04-03 DIAGNOSIS — K219 Gastro-esophageal reflux disease without esophagitis: Secondary | ICD-10-CM | POA: Diagnosis not present

## 2012-04-03 HISTORY — DX: Benign prostatic hyperplasia without lower urinary tract symptoms: N40.0

## 2012-04-03 LAB — BASIC METABOLIC PANEL
BUN: 14 mg/dL (ref 6–23)
Calcium: 9.2 mg/dL (ref 8.4–10.5)
Creatinine, Ser: 0.82 mg/dL (ref 0.50–1.35)
GFR calc non Af Amer: 85 mL/min — ABNORMAL LOW (ref >90)
Glucose, Bld: 75 mg/dL (ref 70–99)
Potassium: 4 mEq/L (ref 3.5–5.1)

## 2012-04-03 LAB — CBC
Hemoglobin: 14 g/dL (ref 13.0–17.0)
MCH: 30.2 pg (ref 26.0–34.0)
MCHC: 35.4 g/dL (ref 30.0–36.0)
RDW: 13.8 % (ref 11.5–15.5)

## 2012-04-03 LAB — PROTIME-INR: Prothrombin Time: 13.3 seconds (ref 11.6–15.2)

## 2012-04-03 NOTE — Pre-Procedure Instructions (Signed)
20 LINO WICKLIFF  04/03/2012   Your procedure is scheduled on:  Thursday, June 27  Report to [facility] at 0630 AM.  Call this number if you have problems the morning of surgery: [phone]   Remember:   Do not eat food:After Midnight.  May have clear liquids: up to 4 Hours before arrival.  Clear liquids include soda, tea, black coffee, apple or grape juice, broth.  Take these medicines the morning of surgery with A SIP OF WATER: *Norvasc, Diltiazem,**   Do not wear jewelry, make-up or nail polish.  Do not wear lotions, powders, or perfumes. You may wear deodorant.  Do not shave 48 hours prior to surgery. Men may shave face and neck.  Do not bring valuables to the hospital.  Contacts, dentures or bridgework may not be worn into surgery.  Leave suitcase in the car. After surgery it may be brought to your room.  For patients admitted to the hospital, checkout time is 11:00 AM the day of discharge.   Patients discharged the day of surgery will not be allowed to drive home.  Name and phone number of your driver: *n/a**  Special Instructions: CHG Shower Use Special Wash: 1/2 bottle night before surgery and 1/2 bottle morning of surgery.   Please read over the following fact sheets that you were given: Pain Booklet, Coughing and Deep Breathing and Surgical Site Infection Prevention

## 2012-04-03 NOTE — Progress Notes (Signed)
Pt states he fell off lawn mower last Saturday and hit left forehead resulting in bruising. Denies change in vision, n/v, or change in headaches. Was checked by family doctor and no problem found.

## 2012-04-09 MED ORDER — NIMODIPINE 30 MG PO CAPS
60.0000 mg | ORAL_CAPSULE | ORAL | Status: AC
  Administered 2012-04-10: 60 mg via ORAL
  Filled 2012-04-09: qty 2

## 2012-04-09 MED ORDER — CEFAZOLIN SODIUM-DEXTROSE 2-3 GM-% IV SOLR
2.0000 g | Freq: Once | INTRAVENOUS | Status: DC
  Filled 2012-04-09: qty 50

## 2012-04-10 ENCOUNTER — Ambulatory Visit (HOSPITAL_COMMUNITY): Payer: Medicare Other | Admitting: Certified Registered"

## 2012-04-10 ENCOUNTER — Ambulatory Visit (HOSPITAL_COMMUNITY)
Admission: RE | Admit: 2012-04-10 | Discharge: 2012-04-10 | Disposition: A | Discharge status: Home / Self Care | Payer: Medicare Other | Source: Ambulatory Visit | Attending: Interventional Radiology | Admitting: Interventional Radiology

## 2012-04-10 ENCOUNTER — Encounter (HOSPITAL_COMMUNITY): Payer: Self-pay

## 2012-04-10 ENCOUNTER — Encounter (HOSPITAL_COMMUNITY): Payer: Self-pay | Admitting: *Deleted

## 2012-04-10 ENCOUNTER — Encounter (HOSPITAL_COMMUNITY)
Admission: RE | Disposition: A | Discharge status: Home / Self Care | Payer: Self-pay | Source: Ambulatory Visit | Attending: Interventional Radiology

## 2012-04-10 ENCOUNTER — Encounter (HOSPITAL_COMMUNITY): Payer: Self-pay | Admitting: Certified Registered"

## 2012-04-10 VITALS — BP 107/57 | HR 51 | Temp 97.2°F | Resp 18

## 2012-04-10 DIAGNOSIS — Z01812 Encounter for preprocedural laboratory examination: Secondary | ICD-10-CM | POA: Insufficient documentation

## 2012-04-10 DIAGNOSIS — I6529 Occlusion and stenosis of unspecified carotid artery: Secondary | ICD-10-CM | POA: Diagnosis not present

## 2012-04-10 DIAGNOSIS — H9319 Tinnitus, unspecified ear: Secondary | ICD-10-CM | POA: Diagnosis not present

## 2012-04-10 DIAGNOSIS — I671 Cerebral aneurysm, nonruptured: Secondary | ICD-10-CM

## 2012-04-10 DIAGNOSIS — H9190 Unspecified hearing loss, unspecified ear: Secondary | ICD-10-CM | POA: Insufficient documentation

## 2012-04-10 DIAGNOSIS — I10 Essential (primary) hypertension: Secondary | ICD-10-CM | POA: Diagnosis not present

## 2012-04-10 DIAGNOSIS — Z9889 Other specified postprocedural states: Secondary | ICD-10-CM | POA: Diagnosis not present

## 2012-04-10 DIAGNOSIS — K219 Gastro-esophageal reflux disease without esophagitis: Secondary | ICD-10-CM | POA: Insufficient documentation

## 2012-04-10 DIAGNOSIS — Z538 Procedure and treatment not carried out for other reasons: Secondary | ICD-10-CM | POA: Insufficient documentation

## 2012-04-10 DIAGNOSIS — Q283 Other malformations of cerebral vessels: Secondary | ICD-10-CM | POA: Insufficient documentation

## 2012-04-10 DIAGNOSIS — IMO0001 Reserved for inherently not codable concepts without codable children: Secondary | ICD-10-CM | POA: Insufficient documentation

## 2012-04-10 DIAGNOSIS — Q2730 Arteriovenous malformation, site unspecified: Secondary | ICD-10-CM

## 2012-04-10 DIAGNOSIS — H9312 Tinnitus, left ear: Secondary | ICD-10-CM

## 2012-04-10 DIAGNOSIS — E785 Hyperlipidemia, unspecified: Secondary | ICD-10-CM | POA: Insufficient documentation

## 2012-04-10 DIAGNOSIS — N400 Enlarged prostate without lower urinary tract symptoms: Secondary | ICD-10-CM | POA: Insufficient documentation

## 2012-04-10 SURGERY — RADIOLOGY WITH ANESTHESIA
Anesthesia: General

## 2012-04-10 MED ORDER — SODIUM CHLORIDE 0.9 % IV SOLN: INTRAVENOUS | Status: DC

## 2012-04-10 MED ORDER — ACETAMINOPHEN 325 MG PO TABS
650.0000 mg | ORAL_TABLET | Freq: Once | ORAL | Status: AC
  Administered 2012-04-10: 650 mg via ORAL

## 2012-04-10 MED ORDER — HEPARIN SODIUM (PORCINE) 1000 UNIT/ML IJ SOLN
INTRAMUSCULAR | Status: DC | PRN
  Administered 2012-04-10: 1000 [IU] via INTRAVENOUS

## 2012-04-10 MED ORDER — SODIUM CHLORIDE 0.9 % IJ SOLN
1.5000 mg | INTRAVENOUS | Status: DC
  Filled 2012-04-10: qty 0.3

## 2012-04-10 MED ORDER — LACTATED RINGERS IV SOLN
INTRAVENOUS | Status: DC | PRN
  Administered 2012-04-10: 08:00:00 via INTRAVENOUS

## 2012-04-10 MED ORDER — MIDAZOLAM HCL 5 MG/5ML IJ SOLN
INTRAMUSCULAR | Status: DC | PRN
  Administered 2012-04-10 (×2): 1 mg via INTRAVENOUS

## 2012-04-10 MED ORDER — GLYCOPYRROLATE 0.2 MG/ML IJ SOLN
INTRAMUSCULAR | Status: DC | PRN
  Administered 2012-04-10: 0.2 mg via INTRAVENOUS

## 2012-04-10 MED ORDER — FENTANYL CITRATE 0.05 MG/ML IJ SOLN
INTRAMUSCULAR | Status: DC | PRN
  Administered 2012-04-10 (×2): 50 ug via INTRAVENOUS

## 2012-04-10 MED ORDER — SODIUM CHLORIDE 0.9 % IV SOLN: INTRAVENOUS | Status: AC

## 2012-04-10 NOTE — Discharge Instructions (Signed)

## 2012-04-10 NOTE — ED Notes (Signed)
O2 d/c'd 

## 2012-04-10 NOTE — ED Notes (Signed)
MD at bedside. 

## 2012-04-10 NOTE — Anesthesia Postprocedure Evaluation (Signed)
  Anesthesia Post-op Note  Patient: Julian Fowler  Procedure(s) Performed: Procedure(s) (LRB): RADIOLOGY WITH ANESTHESIA (N/A)  Patient Location: Radiology  Anesthesia Type: MAC  Level of Consciousness: awake, alert  and oriented  Airway and Oxygen Therapy: Patient Spontanous Breathing and Patient connected to nasal cannula oxygen  Post-op Pain: none  Post-op Assessment: Post-op Vital signs reviewed, Patient's Cardiovascular Status Stable, Respiratory Function Stable, Patent Airway and No signs of Nausea or vomiting  Post-op Vital Signs: Reviewed and stable  Complications: No apparent anesthesia complications

## 2012-04-10 NOTE — Transfer of Care (Signed)
Immediate Anesthesia Transfer of Care Note  Patient: Julian Fowler  Procedure(s) Performed: Procedure(s) (LRB): RADIOLOGY WITH ANESTHESIA (N/A)  Patient Location: Radiology  Anesthesia Type: MAC  Level of Consciousness: awake, alert , oriented and patient cooperative  Airway & Oxygen Therapy: Patient Spontanous Breathing and Patient connected to nasal cannula oxygen  Post-op Assessment: Report given to PACU RN, Post -op Vital signs reviewed and stable and Patient moving all extremities  Post vital signs: Reviewed and stable  Complications: No apparent anesthesia complications

## 2012-04-10 NOTE — Procedures (Signed)
S/P  4 vessel cerebral arteriogram. RT CFA approach. Preliminary findings  1. No feeders from Lt ECA branches . 2. Lt PICA  Feeder unchanged , cerebellar cortical vein filling.

## 2012-04-10 NOTE — H&P (Signed)
Julian Fowler is an 77 y.o. male.   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 Continues to have roaring pulsatile tinnitus. Scheduled now for cerebral arteriogram and possible embolization HPI: HTN; BPH; GERD; hyperlipidemia  Past Medical History  Diagnosis Date  . Anxiety     codependent relationship with irresponsible son  . Depression   . GERD (gastroesophageal reflux disease)   . Hyperlipidemia   . Hypertension December 29, 2009    renal artery Doppler , normal  12/2009  . Abdominal aorta injury     Normal size, ultrasound, March 17,2011  . Asbestos exposure     Hx of asbestos related pleural plaques  . Overactive bladder   . ED (erectile dysfunction)   . Arthritis   . Hearing loss in left ear   . Baker's cyst     Left leg  . SVT (supraventricular tachycardia)     AVNRT, AV nodal reentry tachycardia  . Allergic rhinitis   . OSA (obstructive sleep apnea)     moderate -NPSG 03/02/08 AHI 23 cpap 8..  . Syncope December 2010    09/2009  felt to be vasovagal or micturition syncope  . Chest pain     nuclear, 10/2007, normal  . Ejection fraction     60%, echo, 2008, mild RV dysfunction  . Easy bruisability   . H/O sleep apnea     resolved with weight loss  . BPH (benign prostatic hyperplasia)     Past Surgical History  Procedure Date  . Cholecystectomy 6/98  . Craniectomy for excision of acoustic neuroma 3/95  . Trigger finger release 2003    (thumb) middle finger (2006)  . Cerebral embolization 12/2011    Family History  Problem Relation Age of Onset  . Cancer Father     oral cancer  . Breast cancer Mother   . Heart attack Mother   . Hypertension Sister     Bypass x4  . Alcohol abuse Sister   . Alzheimer's disease Sister   . Kidney disease Sister    Social History:  reports that he quit smoking about 25 years ago. His smoking use included Cigarettes. He has a 45 pack-year smoking history. He has never used smokeless  tobacco. He reports that he does not drink alcohol or use illicit drugs.  Allergies:  Allergies  Allergen Reactions  . Codeine     REACTION: groggy     (Not in a hospital admission)  No results found for this or any previous visit (from the past 48 hour(s)). No results found.  Review of Systems  Constitutional: Negative for fever.  HENT: Positive for tinnitus.        Lt pulsatile tinnitus  Eyes: Negative for blurred vision.  Respiratory: Negative for cough and shortness of breath.   Cardiovascular: Negative for chest pain.  Gastrointestinal: Negative for nausea, vomiting and abdominal pain.  Neurological: Positive for dizziness and headaches.  Psychiatric/Behavioral: The patient is nervous/anxious.     There were no vitals taken for this visit. Physical Exam  Constitutional: He is oriented to person, place, and time. He appears well-developed and well-nourished.  HENT:  Head: Atraumatic.  Eyes: EOM are normal.  Neck: Normal range of motion.  Cardiovascular: Normal rate, regular rhythm and normal heart sounds.   No murmur heard. Respiratory: Effort normal and breath sounds normal. He has no wheezes.  GI: Soft. Bowel sounds are normal. There is no tenderness.  Musculoskeletal: Normal range of motion. He exhibits no  edema.  Neurological: He is alert and oriented to person, place, and time. No cranial nerve deficit.  Skin: Skin is warm and dry.  Psychiatric: He has a normal mood and affect. His behavior is normal. Judgment and thought content normal.     Assessment/Plan Previous left sided dural AV fistula embolization 3/13 sxs continue Scheduled now for cerebral arteriogram and possible embolization if necessary Pt aware of procedure benefits and risks and agreeable to proceed. Consent signed and in chart Pt aware if embolization is performed he will be admitted to Neuro ICU overnight  Lainee Lehrman A 04/10/2012, 7:31 AM

## 2012-04-10 NOTE — ED Notes (Signed)
Exoseal closure right groin by Dulce Sellar, RT.  Pressure dressing applied.  +3 pulse right pedal pulse.

## 2012-04-10 NOTE — Anesthesia Procedure Notes (Signed)
Procedure Name: MAC Date/Time: 04/10/2012 8:51 AM Performed by: Sheppard Evens Pre-anesthesia Checklist: Patient identified, Emergency Drugs available, Suction available, Patient being monitored and Timeout performed Oxygen Delivery Method: Nasal cannula

## 2012-04-10 NOTE — ED Notes (Signed)
Post cerebral angiogram charting.

## 2012-04-10 NOTE — Anesthesia Preprocedure Evaluation (Signed)
Anesthesia Evaluation  Patient identified by MRN, date of birth, ID band Patient awake    Reviewed: Allergy & Precautions, H&P , NPO status , Patient's Chart, lab work & pertinent test results  Airway Mallampati: II  Neck ROM: full    Dental   Pulmonary asthma , sleep apnea , COPDformer smoker         Cardiovascular hypertension, + dysrhythmias Supra Ventricular Tachycardia     Neuro/Psych Anxiety Depression    GI/Hepatic GERD-  ,  Endo/Other    Renal/GU      Musculoskeletal   Abdominal   Peds  Hematology   Anesthesia Other Findings   Reproductive/Obstetrics                           Anesthesia Physical Anesthesia Plan  ASA: III  Anesthesia Plan: General   Post-op Pain Management:    Induction: Intravenous  Airway Management Planned: Oral ETT  Additional Equipment:   Intra-op Plan:   Post-operative Plan: Extubation in OR  Informed Consent: I have reviewed the patients History and Physical, chart, labs and discussed the procedure including the risks, benefits and alternatives for the proposed anesthesia with the patient or authorized representative who has indicated his/her understanding and acceptance.     Plan Discussed with: CRNA and Surgeon  Anesthesia Plan Comments:         Anesthesia Quick Evaluation

## 2012-04-18 ENCOUNTER — Telehealth (HOSPITAL_COMMUNITY): Payer: Self-pay

## 2012-04-18 NOTE — Telephone Encounter (Signed)
Spoke with Julian Fowler.  I gave her Mr. Lloyd Demographics.

## 2012-04-24 ENCOUNTER — Ambulatory Visit (HOSPITAL_COMMUNITY): Payer: Medicare Other

## 2012-04-25 ENCOUNTER — Telehealth (HOSPITAL_COMMUNITY): Payer: Self-pay

## 2012-04-25 NOTE — Telephone Encounter (Signed)
I had to call Julian Fowler in order to reschedule his apt due to the Conference.  I explained to Julian Fowler that if I could get him an apt any sooner than Friday I would call him back.  I advised him that were trying to figure out a specific time that Julian Fowler would be back on Monday.  His concern was that he is expecting to receive a call from [facility] to set up that apt for next week.  Later on in the day Julian Fowler had his daughter Julian Fowler call the office about his apt change.  She stated that there was no way that she could do Friday.  I advised her that I had told her father that I was trying to get him an apt sooner but, at that time I could not give him a def. Answer and I would call him when I got one.  I called Julian Fowler this morning once I got the time from Julian Fowler.  I scheduled the apt for 230 on Monday.

## 2012-04-28 ENCOUNTER — Ambulatory Visit (HOSPITAL_COMMUNITY)
Admission: RE | Admit: 2012-04-28 | Discharge: 2012-04-28 | Disposition: A | Discharge status: Home / Self Care | Payer: Medicare Other | Source: Ambulatory Visit | Attending: Interventional Radiology | Admitting: Interventional Radiology

## 2012-04-28 ENCOUNTER — Ambulatory Visit (HOSPITAL_COMMUNITY): Payer: Medicare Other

## 2012-04-28 DIAGNOSIS — H9319 Tinnitus, unspecified ear: Secondary | ICD-10-CM | POA: Diagnosis not present

## 2012-04-29 DIAGNOSIS — H4011X0 Primary open-angle glaucoma, stage unspecified: Secondary | ICD-10-CM | POA: Diagnosis not present

## 2012-04-29 DIAGNOSIS — H409 Unspecified glaucoma: Secondary | ICD-10-CM | POA: Diagnosis not present

## 2012-04-30 DIAGNOSIS — R21 Rash and other nonspecific skin eruption: Secondary | ICD-10-CM | POA: Diagnosis not present

## 2012-05-02 ENCOUNTER — Ambulatory Visit (HOSPITAL_COMMUNITY): Payer: Medicare Other

## 2012-05-21 DIAGNOSIS — H9319 Tinnitus, unspecified ear: Secondary | ICD-10-CM | POA: Diagnosis not present

## 2012-05-21 DIAGNOSIS — I770 Arteriovenous fistula, acquired: Secondary | ICD-10-CM | POA: Diagnosis not present

## 2012-06-05 DIAGNOSIS — H409 Unspecified glaucoma: Secondary | ICD-10-CM | POA: Diagnosis not present

## 2012-06-05 DIAGNOSIS — H7419 Adhesive middle ear disease, unspecified ear: Secondary | ICD-10-CM | POA: Diagnosis not present

## 2012-06-12 DIAGNOSIS — Z01818 Encounter for other preprocedural examination: Secondary | ICD-10-CM | POA: Diagnosis not present

## 2012-06-12 DIAGNOSIS — Q283 Other malformations of cerebral vessels: Secondary | ICD-10-CM | POA: Diagnosis not present

## 2012-06-13 DIAGNOSIS — I10 Essential (primary) hypertension: Secondary | ICD-10-CM | POA: Diagnosis not present

## 2012-06-13 DIAGNOSIS — I770 Arteriovenous fistula, acquired: Secondary | ICD-10-CM | POA: Diagnosis not present

## 2012-06-13 DIAGNOSIS — Q283 Other malformations of cerebral vessels: Secondary | ICD-10-CM | POA: Diagnosis not present

## 2012-06-13 DIAGNOSIS — I671 Cerebral aneurysm, nonruptured: Secondary | ICD-10-CM | POA: Diagnosis not present

## 2012-06-14 ENCOUNTER — Other Ambulatory Visit: Payer: Self-pay | Admitting: Cardiology

## 2012-06-18 ENCOUNTER — Telehealth (HOSPITAL_COMMUNITY): Payer: Self-pay

## 2012-06-18 NOTE — Telephone Encounter (Signed)
I spoke with [name] in regards to the protocol that is needed for the MRI/MRA in Feb.  She felt that both could be perfromed here at [facility].  I will let her know 2 weeks in advance of scheduling in order for her to set things up.

## 2012-06-18 NOTE — Telephone Encounter (Signed)
We received copies of all the notes from Dr. Bluford Kaufmann for [facility].  The reports have been scanned in the chart.

## 2012-06-23 ENCOUNTER — Encounter (HOSPITAL_COMMUNITY): Payer: Self-pay | Admitting: *Deleted

## 2012-06-23 ENCOUNTER — Emergency Department (HOSPITAL_COMMUNITY)
Admission: EM | Admit: 2012-06-23 | Discharge: 2012-06-23 | Disposition: A | Discharge status: Home / Self Care | Payer: Medicare Other | Attending: Emergency Medicine | Admitting: Emergency Medicine

## 2012-06-23 ENCOUNTER — Emergency Department (HOSPITAL_COMMUNITY): Payer: Medicare Other

## 2012-06-23 DIAGNOSIS — E785 Hyperlipidemia, unspecified: Secondary | ICD-10-CM | POA: Insufficient documentation

## 2012-06-23 DIAGNOSIS — I10 Essential (primary) hypertension: Secondary | ICD-10-CM | POA: Insufficient documentation

## 2012-06-23 DIAGNOSIS — Z79899 Other long term (current) drug therapy: Secondary | ICD-10-CM | POA: Diagnosis not present

## 2012-06-23 DIAGNOSIS — R51 Headache: Secondary | ICD-10-CM | POA: Insufficient documentation

## 2012-06-23 DIAGNOSIS — Z9889 Other specified postprocedural states: Secondary | ICD-10-CM | POA: Insufficient documentation

## 2012-06-23 DIAGNOSIS — R9431 Abnormal electrocardiogram [ECG] [EKG]: Secondary | ICD-10-CM | POA: Diagnosis not present

## 2012-06-23 MED ORDER — SODIUM CHLORIDE 0.9 % IJ SOLN: 3.0000 mL | INTRAMUSCULAR | Status: DC

## 2012-06-23 NOTE — ED Provider Notes (Signed)
History     CSN: [id number]  Arrival date & time 06/23/12  1214   First MD Initiated Contact with Patient 06/23/12 1652      Chief Complaint  Patient presents with  . Headache    (Consider location/radiation/quality/duration/timing/severity/associated sxs/prior treatment) HPI Pt with history of Gamma knife procedure at [facility] about 10 days ago reports a moderate aching posterior headache and neck stiffness. But no blurry vision, vomiting, weakness or numbness. Pt called his radiation oncology doctor who recommended he come to the ED for head CT. He states he was told some of his symptoms would be expected for about 3 weeks post-procedure.   Past Medical History  Diagnosis Date  . Anxiety     codependent relationship with irresponsible son  . Depression   . GERD (gastroesophageal reflux disease)   . Hyperlipidemia   . Hypertension December 29, 2009    renal artery Doppler , normal  12/2009  . Abdominal aorta injury     Normal size, ultrasound, March 17,2011  . Asbestos exposure     Hx of asbestos related pleural plaques  . Overactive bladder   . ED (erectile dysfunction)   . Arthritis   . Hearing loss in left ear   . Baker's cyst     Left leg  . SVT (supraventricular tachycardia)     AVNRT, AV nodal reentry tachycardia  . Allergic rhinitis   . OSA (obstructive sleep apnea)     moderate -NPSG 03/02/08 AHI 23 cpap 8..  . Syncope December 2010    09/2009  felt to be vasovagal or micturition syncope  . Chest pain     nuclear, 10/2007, normal  . Ejection fraction     60%, echo, 2008, mild RV dysfunction  . Easy bruisability   . H/O sleep apnea     resolved with weight loss  . BPH (benign prostatic hyperplasia)     Past Surgical History  Procedure Date  . Cholecystectomy 6/98  . Craniectomy for excision of acoustic neuroma 3/95  . Trigger finger release 2003    (thumb) middle finger (2006)  . Cerebral embolization 12/2011  . Brain surgery     Family History  Problem  Relation Age of Onset  . Cancer Father     oral cancer  . Breast cancer Mother   . Heart attack Mother   . Hypertension Sister     Bypass x4  . Alcohol abuse Sister   . Alzheimer's disease Sister   . Kidney disease Sister     History  Substance Use Topics  . Smoking status: Former Smoker -- 1.5 packs/day for 30 years    Types: Cigarettes    Quit date: 10/15/1986  . Smokeless tobacco: Never Used  . Alcohol Use: No     Used to drink heavily at times      Review of Systems All other systems reviewed and are negative except as noted in HPI.   Allergies  Codeine and Tape  Home Medications   Current Outpatient Rx  Name Route Sig Dispense Refill  . ACETAMINOPHEN 325 MG PO TABS Oral Take 325 mg by mouth every 6 (six) hours as needed. For pain    . AMLODIPINE BESYLATE 2.5 MG PO TABS Oral Take 2.5 mg by mouth daily.    Marland Kitchen AMLODIPINE BESYLATE 2.5 MG PO TABS  TAKE ONE TABLET BY MOUTH EVERY DAY 30 tablet 2    Needs to call office for follow up appointment wit ...  . CALCIUM  CARBONATE 600 MG PO TABS Oral Take 600 mg by mouth daily.      . CHLORTHALIDONE 25 MG PO TABS Oral Take 25 mg by mouth daily.    Marland Kitchen DILTIAZEM HCL ER COATED BEADS 120 MG PO CP24 Oral Take 120 mg by mouth 2 (two) times daily.    Marland Kitchen FEXOFENADINE HCL 180 MG PO TABS Oral Take 180 mg by mouth daily as needed. For allergies    . LORAZEPAM 1 MG PO TABS Oral Take 1-2 mg by mouth at bedtime as needed.    Marland Kitchen LOSARTAN POTASSIUM 50 MG PO TABS Oral Take 50 mg by mouth daily.    . CENTRUM SILVER PO Oral Take 1 tablet by mouth daily.      Marland Kitchen NAPROXEN SODIUM 220 MG PO TABS Oral Take 220 mg by mouth daily as needed. For pain    . OMEPRAZOLE 20 MG PO CPDR Oral Take 20 mg by mouth daily. Take 1 tablet once daily thirty minutes before a meal    . POTASSIUM CHLORIDE CRYS ER 20 MEQ PO TBCR Oral Take 1 tablet (20 mEq total) by mouth daily. 90 tablet 1  . PRAVASTATIN SODIUM 20 MG PO TABS Oral Take 20 mg by mouth at bedtime.      . QUINAPRIL  HCL 40 MG PO TABS Oral Take 40 mg by mouth 2 (two) times daily.    Marland Kitchen VITAMIN A 8000 UNITS PO CAPS Oral Take 16,000 Units by mouth daily.        BP 179/78  Pulse 60  Temp 97.5 F (36.4 C) (Oral)  Resp 18  Ht 5' 9.5" (1.765 m)  Wt 195 lb (88.451 kg)  BMI 28.38 kg/m2  SpO2 100%  Physical Exam  Nursing note and vitals reviewed. Constitutional: He is oriented to person, place, and time. He appears well-developed and well-nourished.  HENT:  Head: Normocephalic and atraumatic.  Eyes: EOM are normal. Pupils are equal, round, and reactive to light.  Neck: Normal range of motion. Neck supple.  Cardiovascular: Normal rate, normal heart sounds and intact distal pulses.   Pulmonary/Chest: Effort normal and breath sounds normal.  Abdominal: Bowel sounds are normal. He exhibits no distension. There is no tenderness.  Musculoskeletal: Normal range of motion. He exhibits no edema and no tenderness.  Neurological: He is alert and oriented to person, place, and time. He has normal strength. No cranial nerve deficit or sensory deficit.  Skin: Skin is warm and dry. No rash noted.  Psychiatric: He has a normal mood and affect.    ED Course  Procedures (including critical care time)  Labs Reviewed - No data to display Ct Head Wo Contrast  06/23/2012  *RADIOLOGY REPORT*  Clinical Data: History of embolization of a dural AV fistula in March, 2013, presenting with headache.  Prior resection of a left sided acoustic neuroma.  CT HEAD WITHOUT CONTRAST  Technique:  Contiguous axial images were obtained from the base of the skull through the vertex without contrast.  Comparison: MRI brain 12/11/2011.  Findings: Opaque material within arterial and venous branches in the superior left cerebellum related to the prior embolization procedure.  Mild cortical and deep atrophy consistent with age.  No mass lesion.  No midline shift.  No acute hemorrhage or hematoma. No extra-axial fluid collections.  No evidence of  acute infarction.  Prior resection of the left mastoids related to surgical resection of a left sided acoustic neuroma.  No other focal osseous abnormalities involving the skull.  Visualized  paranasal sinuses, bilateral mastoid air cells, and bilateral middle ear cavities well- aerated.  Bilateral carotid siphon and left vertebral artery atherosclerosis.  IMPRESSION:  1.  No acute intracranial abnormality. 2.  Prior embolization of the left superior cerebellar dural AV fistula. 3.  Prior trans-mastoid resection of a left acoustic neuroma.   Original Report Authenticated By: Arnell Sieving, M.D.      No diagnosis found.    MDM   Date: 06/23/2012  Rate: 50  Rhythm: sinus bradycardia  QRS Axis: normal  Intervals: normal  ST/T Wave abnormalities: normal  Conduction Disutrbances:nonspecific intraventricular conduction delay with LVH  Narrative Interpretation:   Old EKG Reviewed: none available   Pt with neg CT, no focal neurologic findings. Advised to followup with his doctors regarding continued post-procedure care and to return to the ED for any worsening or worrisome symptoms.        Kennethia Lynes B. Bernette Mayers, MD 06/23/12 1710

## 2012-06-23 NOTE — ED Notes (Signed)
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  Has had surgery and 10 days ago had radiation. At [facility].  Since then has had neck pain and headache. Pts doctor told him he needed a ct of his head.  Alert, talking,

## 2012-06-30 DIAGNOSIS — S139XXA Sprain of joints and ligaments of unspecified parts of neck, initial encounter: Secondary | ICD-10-CM | POA: Diagnosis not present

## 2012-06-30 DIAGNOSIS — Z23 Encounter for immunization: Secondary | ICD-10-CM | POA: Diagnosis not present

## 2012-07-01 ENCOUNTER — Other Ambulatory Visit: Payer: Self-pay | Admitting: Cardiology

## 2012-07-15 ENCOUNTER — Other Ambulatory Visit: Payer: Self-pay | Admitting: Cardiology

## 2012-07-16 DIAGNOSIS — H9319 Tinnitus, unspecified ear: Secondary | ICD-10-CM | POA: Diagnosis not present

## 2012-07-16 DIAGNOSIS — H833X9 Noise effects on inner ear, unspecified ear: Secondary | ICD-10-CM | POA: Diagnosis not present

## 2012-07-16 DIAGNOSIS — I998 Other disorder of circulatory system: Secondary | ICD-10-CM | POA: Diagnosis not present

## 2012-07-16 DIAGNOSIS — H905 Unspecified sensorineural hearing loss: Secondary | ICD-10-CM | POA: Diagnosis not present

## 2012-07-26 ENCOUNTER — Other Ambulatory Visit: Payer: Self-pay | Admitting: Cardiology

## 2012-07-29 ENCOUNTER — Ambulatory Visit: Payer: Medicare Other | Admitting: Internal Medicine

## 2012-07-29 ENCOUNTER — Other Ambulatory Visit: Payer: Self-pay | Admitting: Cardiology

## 2012-08-04 ENCOUNTER — Other Ambulatory Visit: Payer: Self-pay | Admitting: *Deleted

## 2012-08-04 MED ORDER — DILTIAZEM HCL ER COATED BEADS 120 MG PO CP24: 120.0000 mg | ORAL_CAPSULE | Freq: Two times a day (BID) | ORAL | Status: DC

## 2012-08-04 MED ORDER — LOSARTAN POTASSIUM 50 MG PO TABS: 50.0000 mg | ORAL_TABLET | Freq: Every day | ORAL | Status: DC

## 2012-08-04 MED ORDER — AMLODIPINE BESYLATE 2.5 MG PO TABS: 2.5000 mg | ORAL_TABLET | Freq: Every day | ORAL | Status: DC

## 2012-08-04 MED ORDER — SILDENAFIL CITRATE 100 MG PO TABS: 100.0000 mg | ORAL_TABLET | Freq: Every day | ORAL | Status: DC | PRN

## 2012-08-04 MED ORDER — POTASSIUM CHLORIDE CRYS ER 20 MEQ PO TBCR: 20.0000 meq | EXTENDED_RELEASE_TABLET | Freq: Every day | ORAL | Status: DC

## 2012-08-04 NOTE — Telephone Encounter (Signed)
Patient informed nurse that he hasn't been in office due to having multiple visits r/e blood vessels in his head. Patient given refills on medications and appointment scheduled.

## 2012-08-04 NOTE — Telephone Encounter (Signed)
MEDICATION ISSUES PLEASE CALL CELL # [id number]

## 2012-08-12 ENCOUNTER — Other Ambulatory Visit: Payer: Self-pay | Admitting: Cardiology

## 2012-08-19 DIAGNOSIS — E782 Mixed hyperlipidemia: Secondary | ICD-10-CM | POA: Diagnosis not present

## 2012-08-19 DIAGNOSIS — R7301 Impaired fasting glucose: Secondary | ICD-10-CM | POA: Diagnosis not present

## 2012-08-19 DIAGNOSIS — I10 Essential (primary) hypertension: Secondary | ICD-10-CM | POA: Diagnosis not present

## 2012-08-19 DIAGNOSIS — N400 Enlarged prostate without lower urinary tract symptoms: Secondary | ICD-10-CM | POA: Diagnosis not present

## 2012-08-26 DIAGNOSIS — S139XXA Sprain of joints and ligaments of unspecified parts of neck, initial encounter: Secondary | ICD-10-CM | POA: Diagnosis not present

## 2012-08-26 DIAGNOSIS — IMO0002 Reserved for concepts with insufficient information to code with codable children: Secondary | ICD-10-CM | POA: Diagnosis not present

## 2012-08-26 DIAGNOSIS — R7301 Impaired fasting glucose: Secondary | ICD-10-CM | POA: Diagnosis not present

## 2012-08-26 DIAGNOSIS — H9319 Tinnitus, unspecified ear: Secondary | ICD-10-CM | POA: Diagnosis not present

## 2012-08-26 DIAGNOSIS — E782 Mixed hyperlipidemia: Secondary | ICD-10-CM | POA: Diagnosis not present

## 2012-08-26 DIAGNOSIS — Z0000 Encounter for general adult medical examination without abnormal findings: Secondary | ICD-10-CM | POA: Diagnosis not present

## 2012-08-26 DIAGNOSIS — E871 Hypo-osmolality and hyponatremia: Secondary | ICD-10-CM | POA: Diagnosis not present

## 2012-08-26 DIAGNOSIS — I10 Essential (primary) hypertension: Secondary | ICD-10-CM | POA: Diagnosis not present

## 2012-08-27 ENCOUNTER — Ambulatory Visit (INDEPENDENT_AMBULATORY_CARE_PROVIDER_SITE_OTHER): Payer: Medicare Other | Admitting: Physician Assistant

## 2012-08-27 ENCOUNTER — Encounter: Payer: Self-pay | Admitting: Physician Assistant

## 2012-08-27 VITALS — BP 152/74 | HR 63 | Ht 69.0 in | Wt 212.0 lb

## 2012-08-27 DIAGNOSIS — I2510 Atherosclerotic heart disease of native coronary artery without angina pectoris: Secondary | ICD-10-CM

## 2012-08-27 DIAGNOSIS — I498 Other specified cardiac arrhythmias: Secondary | ICD-10-CM | POA: Diagnosis not present

## 2012-08-27 DIAGNOSIS — I10 Essential (primary) hypertension: Secondary | ICD-10-CM

## 2012-08-27 DIAGNOSIS — R011 Cardiac murmur, unspecified: Secondary | ICD-10-CM | POA: Diagnosis not present

## 2012-08-27 DIAGNOSIS — I471 Supraventricular tachycardia: Secondary | ICD-10-CM

## 2012-08-27 NOTE — Patient Instructions (Addendum)
   Echo  Office will contact with results Continue all current medications. Your physician wants you to follow up in:  1 year.  You will receive a reminder letter in the mail one-two months in advance.  If you don't receive a letter, please call our office to schedule the follow up appointment  

## 2012-08-27 NOTE — Assessment & Plan Note (Signed)
Followup by primary M.D.

## 2012-08-27 NOTE — Progress Notes (Signed)
Primary Cardiologist: Jerral Bonito, MD   HPI: Patient presents for routine followup.  Since last OV in August 2012, with Dr. Myrtis Ser, patient denies any interim development of CP, DOE, or tachycardia palpitations. I  He has, however, undergone complex brain surgery for treatment of residual dural arteriovenous fistulas. He states that he has been diagnosed with 4 of these, most recently treated with gamma knife radiosurgery at the [facility] [place], this past August.  A 12-lead EKG, dated 06/23/2012, done at [facility], indicated SB 50 bpm; LVH by voltage criteria; nonspecific ST changes.  Allergies  Allergen Reactions  . Codeine     REACTION: groggy  . Tape Other (See Comments)    Skin tears    Current Outpatient Prescriptions  Medication Sig Dispense Refill  . acetaminophen (TYLENOL) 325 MG tablet Take 325 mg by mouth as needed. For pain      . amLODipine (NORVASC) 2.5 MG tablet Take 1 tablet (2.5 mg total) by mouth daily.  90 tablet  1  . calcium carbonate (OS-CAL) 600 MG TABS Take 600 mg by mouth daily.        . chlorthalidone (HYGROTON) 25 MG tablet Take 25 mg by mouth daily.      Marland Kitchen diltiazem (CARDIZEM CD) 120 MG 24 hr capsule Take 1 capsule (120 mg total) by mouth 2 (two) times daily.  180 capsule  1  . fexofenadine (ALLEGRA) 180 MG tablet Take 180 mg by mouth daily as needed. For allergies      . LORazepam (ATIVAN) 1 MG tablet Take 1-2 mg by mouth at bedtime as needed.      Marland Kitchen losartan (COZAAR) 50 MG tablet Take 1 tablet (50 mg total) by mouth daily.  90 tablet  1  . Multiple Vitamins-Minerals (CENTRUM SILVER PO) Take 1 tablet by mouth daily.        Marland Kitchen omeprazole (PRILOSEC) 20 MG capsule Take 20 mg by mouth daily. Take 1 tablet once daily thirty minutes before a meal      . potassium chloride SA (K-DUR,KLOR-CON) 20 MEQ tablet Take 1 tablet (20 mEq total) by mouth daily.  90 tablet  1  . pravastatin (PRAVACHOL) 20 MG tablet Take 20 mg by mouth at bedtime.        . quinapril (ACCUPRIL) 40  MG tablet Take 40 mg by mouth 2 (two) times daily.      . sildenafil (VIAGRA) 100 MG tablet Take 1 tablet (100 mg total) by mouth daily as needed for erectile dysfunction.  10 tablet  0  . vitamin A 8000 UNIT capsule Take 16,000 Units by mouth daily.        . [DISCONTINUED] quinapril (ACCUPRIL) 40 MG tablet TAKE ONE TABLET BY MOUTH IN THE MORNING ONE AT BEDTIME  180 tablet  2    Past Medical History  Diagnosis Date  . Anxiety     codependent relationship with irresponsible son  . Depression   . GERD (gastroesophageal reflux disease)   . Hyperlipidemia   . Hypertension December 29, 2009    renal artery Doppler , normal  12/2009  . Abdominal aorta injury     Normal size, ultrasound, March 17,2011  . Asbestos exposure     Hx of asbestos related pleural plaques  . Overactive bladder   . ED (erectile dysfunction)   . Arthritis   . Hearing loss in left ear   . Baker's cyst     Left leg  . SVT (supraventricular tachycardia)  AVNRT, AV nodal reentry tachycardia  . Allergic rhinitis   . OSA (obstructive sleep apnea)     moderate -NPSG 03/02/08 AHI 23 cpap 8..  . Syncope December 2010    09/2009  felt to be vasovagal or micturition syncope  . Chest pain     nuclear, 10/2007, normal  . Ejection fraction     60%, echo, 2008, mild RV dysfunction  . Easy bruisability   . H/O sleep apnea     resolved with weight loss  . BPH (benign prostatic hyperplasia)     Past Surgical History  Procedure Date  . Cholecystectomy 6/98  . Craniectomy for excision of acoustic neuroma 3/95  . Trigger finger release 2003    (thumb) middle finger (2006)  . Cerebral embolization 12/2011  . Brain surgery     History   Social History  . Marital Status: Divorced    Spouse Name: N/A    Number of Children: N/A  . Years of Education: N/A   Occupational History  . Not on file.   Social History Main Topics  . Smoking status: Former Smoker -- 1.5 packs/day for 30 years    Types: Cigarettes    Quit  date: 10/15/1986  . Smokeless tobacco: Never Used  . Alcohol Use: No     Comment: Used to drink heavily at times  . Drug Use: No  . Sexually Active: Not on file   Other Topics Concern  . Not on file   Social History Narrative   Co-dependent relationship with his 9 year old son who has drug and financial problems   Social History Narrative   Co-dependent relationship with his 28 year old son who has drug and financial problems    Problem Relation Age of Onset  . Cancer Father     oral cancer  . Breast cancer Mother   . Heart attack Mother   . Hypertension Sister     Bypass x4  . Alcohol abuse Sister   . Alzheimer's disease Sister   . Kidney disease Sister     ROS: no nausea, vomiting; no fever, chills; no melena, hematochezia; no claudication  PHYSICAL EXAM: BP 152/74  Pulse 63  Ht 5\' 9"  (1.753 m)  Wt 212 lb (96.163 kg)  BMI 31.31 kg/m2  SpO2 97% GENERAL: 77 year-old male; NAD HEENT: NCAT, PERRLA, EOMI; sclera clear; no xanthelasma NECK: palpable bilateral carotid pulses, no bruits; no JVD; no TM LUNGS: CTA bilaterally CARDIAC: RRR (S1, S2); 2-3/6 holosystolic murmur at the base and apical region; no rubs or gallops ABDOMEN: soft, non-tender; intact BS EXTREMETIES: Trace left pedal edema SKIN: warm/dry; no obvious rash/lesions MUSCULOSKELETAL: no joint deformity NEURO: no focal deficit; NL affect   EKG:    ASSESSMENT & PLAN:  Cardiac murmur We'll order a 2-D echocardiogram to rule out significant valvular heart disease. Last study, 09/2007, indicated normal LVF (EF 60-65%), no focal WMAs, mild RVD, and mild TR.  SVT (supraventricular tachycardia) Quiescent on current medication regimen.  Hypertension Followup by primary M.D.    Gene Sharnee Douglass, PAC

## 2012-08-27 NOTE — Assessment & Plan Note (Signed)
Quiescent on current medication regimen. 

## 2012-08-27 NOTE — Assessment & Plan Note (Signed)
We'll order a 2-D echocardiogram to rule out significant valvular heart disease. Last study, 09/2007, indicated normal LVF (EF 60-65%), no focal WMAs, mild RVD, and mild TR.

## 2012-09-02 DIAGNOSIS — H409 Unspecified glaucoma: Secondary | ICD-10-CM | POA: Diagnosis not present

## 2012-09-02 DIAGNOSIS — H4011X0 Primary open-angle glaucoma, stage unspecified: Secondary | ICD-10-CM | POA: Diagnosis not present

## 2012-09-23 ENCOUNTER — Encounter: Payer: Self-pay | Admitting: Internal Medicine

## 2012-09-23 ENCOUNTER — Ambulatory Visit (INDEPENDENT_AMBULATORY_CARE_PROVIDER_SITE_OTHER): Payer: Medicare Other | Admitting: Internal Medicine

## 2012-09-23 ENCOUNTER — Ambulatory Visit (INDEPENDENT_AMBULATORY_CARE_PROVIDER_SITE_OTHER)
Admission: RE | Admit: 2012-09-23 | Discharge: 2012-09-23 | Disposition: A | Discharge status: Home / Self Care | Payer: Medicare Other | Source: Ambulatory Visit | Attending: Internal Medicine | Admitting: Internal Medicine

## 2012-09-23 VITALS — BP 138/76 | HR 68 | Ht 69.0 in | Wt 211.8 lb

## 2012-09-23 DIAGNOSIS — Z77090 Contact with and (suspected) exposure to asbestos: Secondary | ICD-10-CM

## 2012-09-23 DIAGNOSIS — R091 Pleurisy: Secondary | ICD-10-CM

## 2012-09-23 DIAGNOSIS — J61 Pneumoconiosis due to asbestos and other mineral fibers: Secondary | ICD-10-CM | POA: Diagnosis not present

## 2012-09-23 DIAGNOSIS — J920 Pleural plaque with presence of asbestos: Secondary | ICD-10-CM

## 2012-09-23 DIAGNOSIS — T65891A Toxic effect of other specified substances, accidental (unintentional), initial encounter: Secondary | ICD-10-CM | POA: Diagnosis not present

## 2012-09-23 DIAGNOSIS — G4733 Obstructive sleep apnea (adult) (pediatric): Secondary | ICD-10-CM

## 2012-09-23 DIAGNOSIS — F5104 Psychophysiologic insomnia: Secondary | ICD-10-CM

## 2012-09-23 MED ORDER — ZOLPIDEM TARTRATE 5 MG PO TABS: 5.0000 mg | ORAL_TABLET | Freq: Every evening | ORAL | Status: DC | PRN

## 2012-09-23 MED ORDER — LORAZEPAM 1 MG PO TABS: ORAL_TABLET | ORAL | Status: DC

## 2012-09-23 NOTE — Patient Instructions (Addendum)
Scripts for [employer] and lorazepam for sleep if needed  Order- CXR  Dx asbestos plaques

## 2012-09-23 NOTE — Progress Notes (Signed)
07/26/11- 77 year old male former smoker followed for allergic rhinitis, asthma, COPD, obstructive sleep apnea, history of asbestos exposure/plaques. Last here- 07/25/2010  Has had flu vaccine. He had been swimming regularly and had kept his weight down. Had surgery on his hand, that hurt his shoulder and has not been swimming is much. Said previously he could swim the length of the pool underwater. Needing lorazepam still to sleep, especially for sleep onset. Since he lost weight he has not needed CPAP. For the last week and a half has had pain across his upper mid back if he stands up straight. This is not affected by lying supine or by coughing.  09/23/12- 77 year old male former smoker followed for allergic rhinitis, asthma, COPD, obstructive sleep apnea, history of asbestos exposure/plaques. 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 Partially managed with interventional radiology placing coils. Gamma knife radiation therapy at [facility] did not help. Still swimming regularly. Denies cough, chest pain, shortness of breath. Insomnia has been more troublesome this year, partly because of stress from the above issues. Alfonso Patten it just made him groggy. Combination of Ambien 5 mg plus lorazepam 1 mg helps fairly well. Still wakes during the night. We discussed potential that he could tolerate lorazepam 2 mg. He denies nighttime confusion, sleepwalking or morning hangover. He has not recognized sleep apnea as an issue since he lost weight years ago  ROSSee HPI Constitutional:   No-   weight loss, night sweats, fevers, chills, fatigue, lassitude. HEENT:   + headaches,  No -difficulty swallowing, tooth/dental problems, sore throat,       No-  sneezing,  itching, ear ache, nasal congestion, post nasal drip,  CV:  No-   chest pain, orthopnea, PND, swelling in lower extremities, anasarca, dizziness, palpitations Resp: No-   shortness of breath with exertion or at rest.              No-   productive cough,  No non-productive cough,  No-  coughing up of blood.              No-   change in color of mucus.  No- wheezing.   Skin: No-   rash or lesions. GI:  No-   heartburn, indigestion, abdominal pain, nausea, vomiting,              GU:  MS:  No-   joint pain or swelling. .  + back pain. Neuro-  nothing unusual except per  HPI Psych:  No- change in mood or affect. No depression or anxiety.  No memory loss.  OBJ General- Alert, Oriented, Affect-appropriate, Distress- none acute Skin- rash-none, lesions- none, excoriation- none, dyed hair Lymphadenopathy- none Head- atraumatic            Eyes- Gross vision intact, PERRLA, conjunctivae clear secretions            Ears- Hearing, canals-normal            Nose- Clear, no-Septal dev, mucus, polyps, erosion, perforation             Throat- Mallampati II , mucosa clear , drainage- none, tonsils- atrophic. Dental repair. Neck- flexible , trachea midline, no stridor , thyroid nl, carotid no bruit Chest - symmetrical excursion , unlabored  Heart/CV- slow RRR , no murmur , no gallop  , no rub, nl s1 s2                           - JVD- none , edema- none, stasis changes- none, varices- none           Lung- clear to P&A, wheeze- none, cough- none , dullness-none, rub- none. I cannot hear fine crackles or rub.           Chest wall-  Abd-  Br/ Gen/ Rectal- Not done, not indicated Extrem- cyanosis- none, clubbing, none, atrophy- none, strength- nl Neuro- grossly intact to observation. Speech is clear.

## 2012-09-24 ENCOUNTER — Other Ambulatory Visit: Payer: Medicare Other

## 2012-09-24 NOTE — Progress Notes (Signed)
Quick Note:  Spoke with pt and notified of results per Dr. Young. Pt verbalized understanding and denied any questions.  ______ 

## 2012-09-26 DIAGNOSIS — M4712 Other spondylosis with myelopathy, cervical region: Secondary | ICD-10-CM | POA: Diagnosis not present

## 2012-09-26 DIAGNOSIS — M9981 Other biomechanical lesions of cervical region: Secondary | ICD-10-CM | POA: Diagnosis not present

## 2012-09-26 DIAGNOSIS — M531 Cervicobrachial syndrome: Secondary | ICD-10-CM | POA: Diagnosis not present

## 2012-09-29 DIAGNOSIS — M531 Cervicobrachial syndrome: Secondary | ICD-10-CM | POA: Diagnosis not present

## 2012-09-29 DIAGNOSIS — M9981 Other biomechanical lesions of cervical region: Secondary | ICD-10-CM | POA: Diagnosis not present

## 2012-09-29 DIAGNOSIS — F5104 Psychophysiologic insomnia: Secondary | ICD-10-CM | POA: Insufficient documentation

## 2012-09-29 DIAGNOSIS — M4712 Other spondylosis with myelopathy, cervical region: Secondary | ICD-10-CM | POA: Diagnosis not present

## 2012-09-29 NOTE — Assessment & Plan Note (Signed)
Resolved by successful weight loss

## 2012-09-29 NOTE — Assessment & Plan Note (Signed)
Stress of his neurosurgical problems significantly interfered with sleep in the past year. We had a careful discussion of medications, sedation and tolerance with potential for dependence and risk for loss of alertness, risk of falls, need to avoid any impact on driving safety. Plan-lorazepam 1 or 2 mg for sleep with Ambien 5 mg if necessary. Sleep hygiene.

## 2012-09-29 NOTE — Assessment & Plan Note (Signed)
He will remain under long-term surveillance because of asbestos exposure indicated by plaques, and past history of smoking. So far he has been stable. We have discussed CT scan versus chest x-ray.

## 2012-09-30 DIAGNOSIS — M4712 Other spondylosis with myelopathy, cervical region: Secondary | ICD-10-CM | POA: Diagnosis not present

## 2012-09-30 DIAGNOSIS — M531 Cervicobrachial syndrome: Secondary | ICD-10-CM | POA: Diagnosis not present

## 2012-09-30 DIAGNOSIS — M9981 Other biomechanical lesions of cervical region: Secondary | ICD-10-CM | POA: Diagnosis not present

## 2012-10-02 ENCOUNTER — Other Ambulatory Visit: Payer: Self-pay

## 2012-10-02 ENCOUNTER — Other Ambulatory Visit (INDEPENDENT_AMBULATORY_CARE_PROVIDER_SITE_OTHER): Payer: Medicare Other

## 2012-10-02 DIAGNOSIS — I2510 Atherosclerotic heart disease of native coronary artery without angina pectoris: Secondary | ICD-10-CM

## 2012-10-02 DIAGNOSIS — R011 Cardiac murmur, unspecified: Secondary | ICD-10-CM

## 2012-10-03 ENCOUNTER — Telehealth: Payer: Self-pay | Admitting: *Deleted

## 2012-10-03 NOTE — Telephone Encounter (Signed)
Message copied by Lesle Chris on Fri Oct 03, 2012  4:32 PM ------      Message from: Rande Brunt      Created: Fri Oct 03, 2012  8:18 AM       NL LVF with no sig valve dz

## 2012-10-03 NOTE — Telephone Encounter (Signed)
Notes Recorded by Lesle Chris, LPN on 16/07/9603 at 4:32 PM Patient notified. ------  Notes Recorded by Lesle Chris, LPN on 54/06/8118 at 9:36 AM Left message to return call.

## 2012-10-09 DIAGNOSIS — S139XXA Sprain of joints and ligaments of unspecified parts of neck, initial encounter: Secondary | ICD-10-CM | POA: Diagnosis not present

## 2012-10-09 DIAGNOSIS — IMO0002 Reserved for concepts with insufficient information to code with codable children: Secondary | ICD-10-CM | POA: Diagnosis not present

## 2012-10-10 DIAGNOSIS — M5030 Other cervical disc degeneration, unspecified cervical region: Secondary | ICD-10-CM | POA: Diagnosis not present

## 2012-10-14 ENCOUNTER — Other Ambulatory Visit: Payer: Self-pay | Admitting: Cardiology

## 2012-10-14 MED ORDER — CHLORTHALIDONE 25 MG PO TABS: 25.0000 mg | ORAL_TABLET | Freq: Every day | ORAL | Status: DC

## 2012-10-14 NOTE — Telephone Encounter (Signed)
chlorthalidone (HYGROTON) 25 MG tablet  Needs refill [facility] [place], [place]

## 2012-10-15 HISTORY — PX: BACK SURGERY: SHX140

## 2012-11-14 ENCOUNTER — Other Ambulatory Visit (HOSPITAL_COMMUNITY): Payer: Self-pay | Admitting: Interventional Radiology

## 2012-11-14 DIAGNOSIS — I770 Arteriovenous fistula, acquired: Secondary | ICD-10-CM

## 2012-11-26 DIAGNOSIS — I770 Arteriovenous fistula, acquired: Secondary | ICD-10-CM | POA: Diagnosis not present

## 2012-11-26 DIAGNOSIS — Z09 Encounter for follow-up examination after completed treatment for conditions other than malignant neoplasm: Secondary | ICD-10-CM | POA: Diagnosis not present

## 2012-11-26 DIAGNOSIS — Q283 Other malformations of cerebral vessels: Secondary | ICD-10-CM | POA: Diagnosis not present

## 2012-11-26 DIAGNOSIS — M542 Cervicalgia: Secondary | ICD-10-CM | POA: Diagnosis not present

## 2012-11-26 DIAGNOSIS — I671 Cerebral aneurysm, nonruptured: Secondary | ICD-10-CM | POA: Diagnosis not present

## 2012-11-26 DIAGNOSIS — M5020 Other cervical disc displacement, unspecified cervical region: Secondary | ICD-10-CM | POA: Diagnosis not present

## 2012-11-26 DIAGNOSIS — Z8669 Personal history of other diseases of the nervous system and sense organs: Secondary | ICD-10-CM | POA: Diagnosis not present

## 2012-12-09 DIAGNOSIS — H409 Unspecified glaucoma: Secondary | ICD-10-CM | POA: Diagnosis not present

## 2012-12-09 DIAGNOSIS — H4011X0 Primary open-angle glaucoma, stage unspecified: Secondary | ICD-10-CM | POA: Diagnosis not present

## 2012-12-24 DIAGNOSIS — M47812 Spondylosis without myelopathy or radiculopathy, cervical region: Secondary | ICD-10-CM | POA: Diagnosis not present

## 2013-01-27 DIAGNOSIS — M1710 Unilateral primary osteoarthritis, unspecified knee: Secondary | ICD-10-CM | POA: Diagnosis not present

## 2013-02-02 DIAGNOSIS — S40029A Contusion of unspecified upper arm, initial encounter: Secondary | ICD-10-CM | POA: Diagnosis not present

## 2013-02-02 DIAGNOSIS — M719 Bursopathy, unspecified: Secondary | ICD-10-CM | POA: Diagnosis not present

## 2013-02-09 DIAGNOSIS — S43499A Other sprain of unspecified shoulder joint, initial encounter: Secondary | ICD-10-CM | POA: Diagnosis not present

## 2013-02-09 DIAGNOSIS — M47812 Spondylosis without myelopathy or radiculopathy, cervical region: Secondary | ICD-10-CM | POA: Diagnosis not present

## 2013-02-09 DIAGNOSIS — M4802 Spinal stenosis, cervical region: Secondary | ICD-10-CM | POA: Diagnosis not present

## 2013-02-16 DIAGNOSIS — L821 Other seborrheic keratosis: Secondary | ICD-10-CM | POA: Diagnosis not present

## 2013-02-16 DIAGNOSIS — D485 Neoplasm of uncertain behavior of skin: Secondary | ICD-10-CM | POA: Diagnosis not present

## 2013-02-16 DIAGNOSIS — L259 Unspecified contact dermatitis, unspecified cause: Secondary | ICD-10-CM | POA: Diagnosis not present

## 2013-02-16 DIAGNOSIS — L570 Actinic keratosis: Secondary | ICD-10-CM | POA: Diagnosis not present

## 2013-02-16 DIAGNOSIS — L820 Inflamed seborrheic keratosis: Secondary | ICD-10-CM | POA: Diagnosis not present

## 2013-02-16 DIAGNOSIS — L723 Sebaceous cyst: Secondary | ICD-10-CM | POA: Diagnosis not present

## 2013-02-24 ENCOUNTER — Other Ambulatory Visit: Payer: Self-pay | Admitting: *Deleted

## 2013-02-24 MED ORDER — POTASSIUM CHLORIDE CRYS ER 20 MEQ PO TBCR: 20.0000 meq | EXTENDED_RELEASE_TABLET | Freq: Every day | ORAL | Status: DC

## 2013-03-05 DIAGNOSIS — R7301 Impaired fasting glucose: Secondary | ICD-10-CM | POA: Diagnosis not present

## 2013-03-05 DIAGNOSIS — S8010XA Contusion of unspecified lower leg, initial encounter: Secondary | ICD-10-CM | POA: Diagnosis not present

## 2013-03-05 DIAGNOSIS — N400 Enlarged prostate without lower urinary tract symptoms: Secondary | ICD-10-CM | POA: Diagnosis not present

## 2013-03-05 DIAGNOSIS — J309 Allergic rhinitis, unspecified: Secondary | ICD-10-CM | POA: Diagnosis not present

## 2013-03-05 DIAGNOSIS — E782 Mixed hyperlipidemia: Secondary | ICD-10-CM | POA: Diagnosis not present

## 2013-03-05 DIAGNOSIS — E871 Hypo-osmolality and hyponatremia: Secondary | ICD-10-CM | POA: Diagnosis not present

## 2013-03-05 DIAGNOSIS — IMO0002 Reserved for concepts with insufficient information to code with codable children: Secondary | ICD-10-CM | POA: Diagnosis not present

## 2013-03-05 DIAGNOSIS — I10 Essential (primary) hypertension: Secondary | ICD-10-CM | POA: Diagnosis not present

## 2013-03-10 DIAGNOSIS — E871 Hypo-osmolality and hyponatremia: Secondary | ICD-10-CM | POA: Diagnosis not present

## 2013-03-10 DIAGNOSIS — B029 Zoster without complications: Secondary | ICD-10-CM | POA: Diagnosis not present

## 2013-03-10 DIAGNOSIS — E782 Mixed hyperlipidemia: Secondary | ICD-10-CM | POA: Diagnosis not present

## 2013-03-10 DIAGNOSIS — N529 Male erectile dysfunction, unspecified: Secondary | ICD-10-CM | POA: Diagnosis not present

## 2013-03-10 DIAGNOSIS — I10 Essential (primary) hypertension: Secondary | ICD-10-CM | POA: Diagnosis not present

## 2013-03-10 DIAGNOSIS — IMO0002 Reserved for concepts with insufficient information to code with codable children: Secondary | ICD-10-CM | POA: Diagnosis not present

## 2013-03-10 DIAGNOSIS — R7301 Impaired fasting glucose: Secondary | ICD-10-CM | POA: Diagnosis not present

## 2013-03-10 DIAGNOSIS — N400 Enlarged prostate without lower urinary tract symptoms: Secondary | ICD-10-CM | POA: Diagnosis not present

## 2013-03-12 DIAGNOSIS — H00029 Hordeolum internum unspecified eye, unspecified eyelid: Secondary | ICD-10-CM | POA: Diagnosis not present

## 2013-03-12 DIAGNOSIS — H4011X0 Primary open-angle glaucoma, stage unspecified: Secondary | ICD-10-CM | POA: Diagnosis not present

## 2013-03-12 DIAGNOSIS — H409 Unspecified glaucoma: Secondary | ICD-10-CM | POA: Diagnosis not present

## 2013-03-12 DIAGNOSIS — H0260 Xanthelasma of unspecified eye, unspecified eyelid: Secondary | ICD-10-CM | POA: Diagnosis not present

## 2013-03-13 ENCOUNTER — Other Ambulatory Visit: Payer: Self-pay | Admitting: *Deleted

## 2013-03-13 MED ORDER — AMLODIPINE BESYLATE 2.5 MG PO TABS: 2.5000 mg | ORAL_TABLET | Freq: Every day | ORAL | Status: DC

## 2013-03-24 DIAGNOSIS — M4802 Spinal stenosis, cervical region: Secondary | ICD-10-CM | POA: Diagnosis not present

## 2013-03-24 DIAGNOSIS — M47812 Spondylosis without myelopathy or radiculopathy, cervical region: Secondary | ICD-10-CM | POA: Diagnosis not present

## 2013-03-26 ENCOUNTER — Telehealth: Payer: Self-pay | Admitting: *Deleted

## 2013-03-26 NOTE — Telephone Encounter (Signed)
Scheduled for left knee replacement in September 2014 and PCP is requesting if patient need an office visit here or if he will need to be set up for stress test. Nurse advised Dr. Leandrew Koyanagi to forward a copy of the pre-op clearance to our office so that MD could review it, then make that decision.

## 2013-04-20 ENCOUNTER — Other Ambulatory Visit: Payer: Self-pay | Admitting: Internal Medicine

## 2013-04-20 NOTE — Telephone Encounter (Signed)
Ok to refill 

## 2013-04-20 NOTE — Telephone Encounter (Signed)
Please advise if okay to refill. Thanks.  

## 2013-04-29 ENCOUNTER — Other Ambulatory Visit: Payer: Self-pay | Admitting: Internal Medicine

## 2013-05-01 NOTE — Telephone Encounter (Signed)
Last OV with CDY: 09/26/12; was asked to f/u in 1 yr. Pt has a pending OV with CDY on 09/24/13 Ambien rx was given during OV with CDY On 09/26/12 but was printed. Per CDY: ok to call in rx but give for #90 x 1.

## 2013-05-20 ENCOUNTER — Encounter: Payer: Self-pay | Admitting: Cardiology

## 2013-05-20 DIAGNOSIS — M1710 Unilateral primary osteoarthritis, unspecified knee: Secondary | ICD-10-CM | POA: Diagnosis not present

## 2013-05-20 DIAGNOSIS — M5126 Other intervertebral disc displacement, lumbar region: Secondary | ICD-10-CM | POA: Diagnosis not present

## 2013-05-20 DIAGNOSIS — K5900 Constipation, unspecified: Secondary | ICD-10-CM | POA: Diagnosis present

## 2013-05-20 DIAGNOSIS — D333 Benign neoplasm of cranial nerves: Secondary | ICD-10-CM | POA: Diagnosis present

## 2013-05-20 DIAGNOSIS — R52 Pain, unspecified: Secondary | ICD-10-CM | POA: Diagnosis not present

## 2013-05-20 DIAGNOSIS — Z885 Allergy status to narcotic agent status: Secondary | ICD-10-CM | POA: Diagnosis not present

## 2013-05-20 DIAGNOSIS — N318 Other neuromuscular dysfunction of bladder: Secondary | ICD-10-CM | POA: Diagnosis present

## 2013-05-20 DIAGNOSIS — R109 Unspecified abdominal pain: Secondary | ICD-10-CM | POA: Diagnosis not present

## 2013-05-20 DIAGNOSIS — I714 Abdominal aortic aneurysm, without rupture: Secondary | ICD-10-CM | POA: Diagnosis not present

## 2013-05-20 DIAGNOSIS — M549 Dorsalgia, unspecified: Secondary | ICD-10-CM | POA: Diagnosis not present

## 2013-05-20 DIAGNOSIS — M545 Low back pain, unspecified: Secondary | ICD-10-CM | POA: Diagnosis not present

## 2013-05-20 DIAGNOSIS — Z77090 Contact with and (suspected) exposure to asbestos: Secondary | ICD-10-CM | POA: Diagnosis not present

## 2013-05-20 DIAGNOSIS — M47817 Spondylosis without myelopathy or radiculopathy, lumbosacral region: Secondary | ICD-10-CM | POA: Diagnosis not present

## 2013-05-20 DIAGNOSIS — Z87891 Personal history of nicotine dependence: Secondary | ICD-10-CM | POA: Diagnosis not present

## 2013-05-20 DIAGNOSIS — Z79899 Other long term (current) drug therapy: Secondary | ICD-10-CM | POA: Diagnosis not present

## 2013-05-20 DIAGNOSIS — E7800 Pure hypercholesterolemia, unspecified: Secondary | ICD-10-CM | POA: Diagnosis present

## 2013-05-20 DIAGNOSIS — K219 Gastro-esophageal reflux disease without esophagitis: Secondary | ICD-10-CM | POA: Diagnosis present

## 2013-05-20 DIAGNOSIS — M5137 Other intervertebral disc degeneration, lumbosacral region: Secondary | ICD-10-CM | POA: Diagnosis not present

## 2013-05-20 DIAGNOSIS — M48061 Spinal stenosis, lumbar region without neurogenic claudication: Secondary | ICD-10-CM | POA: Diagnosis not present

## 2013-05-20 DIAGNOSIS — M25559 Pain in unspecified hip: Secondary | ICD-10-CM | POA: Diagnosis not present

## 2013-05-20 DIAGNOSIS — I671 Cerebral aneurysm, nonruptured: Secondary | ICD-10-CM | POA: Diagnosis present

## 2013-05-20 DIAGNOSIS — E871 Hypo-osmolality and hyponatremia: Secondary | ICD-10-CM | POA: Diagnosis not present

## 2013-05-25 DIAGNOSIS — M5126 Other intervertebral disc displacement, lumbar region: Secondary | ICD-10-CM | POA: Diagnosis not present

## 2013-05-26 DIAGNOSIS — J61 Pneumoconiosis due to asbestos and other mineral fibers: Secondary | ICD-10-CM | POA: Diagnosis not present

## 2013-05-26 DIAGNOSIS — K219 Gastro-esophageal reflux disease without esophagitis: Secondary | ICD-10-CM | POA: Diagnosis not present

## 2013-05-26 DIAGNOSIS — E785 Hyperlipidemia, unspecified: Secondary | ICD-10-CM | POA: Diagnosis not present

## 2013-05-26 DIAGNOSIS — D333 Benign neoplasm of cranial nerves: Secondary | ICD-10-CM | POA: Diagnosis not present

## 2013-05-26 DIAGNOSIS — I10 Essential (primary) hypertension: Secondary | ICD-10-CM | POA: Diagnosis not present

## 2013-05-26 DIAGNOSIS — M5126 Other intervertebral disc displacement, lumbar region: Secondary | ICD-10-CM | POA: Diagnosis not present

## 2013-05-26 DIAGNOSIS — Z79899 Other long term (current) drug therapy: Secondary | ICD-10-CM | POA: Diagnosis not present

## 2013-05-26 DIAGNOSIS — Z885 Allergy status to narcotic agent status: Secondary | ICD-10-CM | POA: Diagnosis not present

## 2013-05-26 DIAGNOSIS — M539 Dorsopathy, unspecified: Secondary | ICD-10-CM | POA: Diagnosis not present

## 2013-06-09 ENCOUNTER — Telehealth: Payer: Self-pay | Admitting: Cardiology

## 2013-06-09 NOTE — Telephone Encounter (Signed)
Left a VM and staff message for Vickie in the [place] office stating that the pt has been scheduled to see Dr Argentina Ponder in the [place] office in 9/4 @ 11 am. Larene Beach to contact pt.

## 2013-06-09 NOTE — Telephone Encounter (Signed)
Julian Fowler calling re pt needing surg clearance for knee surgery by dr Shon Millet full there will he see pt 06-18-13??

## 2013-06-11 ENCOUNTER — Other Ambulatory Visit: Payer: Self-pay | Admitting: Cardiology

## 2013-06-11 MED ORDER — DILTIAZEM HCL ER COATED BEADS 120 MG PO CP24: 120.0000 mg | ORAL_CAPSULE | Freq: Two times a day (BID) | ORAL | Status: DC

## 2013-06-12 ENCOUNTER — Encounter: Payer: Self-pay | Admitting: Cardiology

## 2013-06-12 DIAGNOSIS — I10 Essential (primary) hypertension: Secondary | ICD-10-CM | POA: Diagnosis not present

## 2013-06-12 DIAGNOSIS — Z01810 Encounter for preprocedural cardiovascular examination: Secondary | ICD-10-CM | POA: Diagnosis not present

## 2013-06-12 DIAGNOSIS — R079 Chest pain, unspecified: Secondary | ICD-10-CM

## 2013-06-12 DIAGNOSIS — E119 Type 2 diabetes mellitus without complications: Secondary | ICD-10-CM | POA: Diagnosis not present

## 2013-06-12 DIAGNOSIS — E785 Hyperlipidemia, unspecified: Secondary | ICD-10-CM | POA: Diagnosis not present

## 2013-06-12 DIAGNOSIS — R9439 Abnormal result of other cardiovascular function study: Secondary | ICD-10-CM | POA: Diagnosis not present

## 2013-06-17 ENCOUNTER — Telehealth: Payer: Self-pay | Admitting: Internal Medicine

## 2013-06-17 ENCOUNTER — Telehealth: Payer: Self-pay | Admitting: Cardiology

## 2013-06-17 ENCOUNTER — Other Ambulatory Visit: Payer: Self-pay | Admitting: Cardiology

## 2013-06-17 DIAGNOSIS — Z23 Encounter for immunization: Secondary | ICD-10-CM | POA: Diagnosis not present

## 2013-06-17 NOTE — Telephone Encounter (Signed)
Patient returning call.

## 2013-06-17 NOTE — Telephone Encounter (Signed)
lmomtcb x1 for pt 

## 2013-06-17 NOTE — Telephone Encounter (Signed)
I spoke with pt and is aware of recs. Nothing further needed 

## 2013-06-17 NOTE — Telephone Encounter (Signed)
Pt states he is returning the nurse's call.  Antionette Fairy

## 2013-06-17 NOTE — Telephone Encounter (Signed)
I spoke with pt. He stated he takes ambien 5 mg every night. He stated this helps for a short time but it does not completely. He tried taking 2 at bedtime and he stated he noticed a big difference and slept much better. He is asking if this can be increased and a 90 day supply called in. Please advise Dr. Maple Hudson thanks Last OV 09/23/12 Pending 09/24/13

## 2013-06-17 NOTE — Telephone Encounter (Signed)
New Prob  Pt is wanting to been by Dr Myrtis Ser because he is due to have surgery on 9.22.14.  Pt was originally schedule for tomorrow and the appt was cancelled per the pt wife. She advised that his other provider said he did not need to be seen by Dr Myrtis Ser for clearance. Pt states that he had a stress test done at [facility] and he is now concerned about the stress test. I have scheduled him to see Lawson Fiscal on 9.17 but pt does not want to wait that long. Pt would like for a nurse to speak with Dr Myrtis Ser and to give him a call when you get a chance.

## 2013-06-17 NOTE — Telephone Encounter (Signed)
Ambien 5 mg is the recommended dose for his age. His insurance may resist paying for the higher dose. Risk of falls is increased at night with the higher dose.

## 2013-06-18 ENCOUNTER — Telehealth: Payer: Self-pay | Admitting: Internal Medicine

## 2013-06-18 ENCOUNTER — Encounter: Payer: Self-pay | Admitting: Cardiology

## 2013-06-18 ENCOUNTER — Ambulatory Visit: Payer: Medicare Other | Admitting: Cardiology

## 2013-06-18 NOTE — Progress Notes (Signed)
    The patient needs to be cleared for knee surgery July 06, 2013. He had an echo in December, 2013. Wall motion was normal. The nuclear scan was arranged, I think by his primary physician. The study questions the possibility of a fixed defect. The description of the defect does not fit a classic area of injury. It is a medium size defect with mild to moderate decreased activity in the apical septal, mid inferior, mid inferoseptal, and mid inferolateral segments. There was no ischemia. Wall motion was normal. I'm not convinced that this nuclear scan reveals any significant abnormality. The patient will be seen in the office and all his data will be reviewed to make final decisions about clearing him for surgery.

## 2013-06-18 NOTE — Telephone Encounter (Signed)
I spoke with pt. He stated he had a message to call us. He thinks it was an old message. He needed nothing further needed

## 2013-06-18 NOTE — Telephone Encounter (Signed)
Please let the patient know that I have carefully reviewed the nuclear report. Please reassure him that I am not convinced that there is any definite abnormality. Please add him to my schedule on the afternoon of June 26, 2013. This will give [place] plenty of time to make final plans for him. (By the way, I will ask Chip Boer to move to another patient off of that schedule on September 12)

## 2013-06-22 NOTE — Telephone Encounter (Signed)
Patient informed and verbalized understanding

## 2013-06-23 ENCOUNTER — Other Ambulatory Visit: Payer: Self-pay | Admitting: Orthopedic Surgery

## 2013-06-23 DIAGNOSIS — H4011X0 Primary open-angle glaucoma, stage unspecified: Secondary | ICD-10-CM | POA: Diagnosis not present

## 2013-06-23 DIAGNOSIS — H409 Unspecified glaucoma: Secondary | ICD-10-CM | POA: Diagnosis not present

## 2013-06-23 NOTE — Progress Notes (Signed)
Preoperative surgical orders have been place into the Epic hospital system for Julian Fowler on 06/23/2013, 5:25 PM  by Patrica Duel for surgery on 07/06/2013.  Preop Total Knee orders including Experal, PO Tylenol, and IV Decadron as long as there are no contraindications to the above medications. [name], PA-C

## 2013-06-25 ENCOUNTER — Encounter (HOSPITAL_COMMUNITY): Payer: Self-pay | Admitting: Pharmacy Technician

## 2013-06-26 ENCOUNTER — Ambulatory Visit (INDEPENDENT_AMBULATORY_CARE_PROVIDER_SITE_OTHER): Payer: Medicare Other | Admitting: Cardiology

## 2013-06-26 ENCOUNTER — Encounter: Payer: Self-pay | Admitting: *Deleted

## 2013-06-26 ENCOUNTER — Encounter: Payer: Self-pay | Admitting: Cardiology

## 2013-06-26 VITALS — BP 165/81 | HR 57 | Ht 68.0 in | Wt 208.8 lb

## 2013-06-26 DIAGNOSIS — F411 Generalized anxiety disorder: Secondary | ICD-10-CM | POA: Diagnosis not present

## 2013-06-26 DIAGNOSIS — I10 Essential (primary) hypertension: Secondary | ICD-10-CM | POA: Diagnosis not present

## 2013-06-26 DIAGNOSIS — Z01810 Encounter for preprocedural cardiovascular examination: Secondary | ICD-10-CM | POA: Diagnosis not present

## 2013-06-26 DIAGNOSIS — F419 Anxiety disorder, unspecified: Secondary | ICD-10-CM

## 2013-06-26 MED ORDER — ZOLPIDEM TARTRATE 10 MG PO TABS: 10.0000 mg | ORAL_TABLET | Freq: Every evening | ORAL | Status: DC | PRN

## 2013-06-26 NOTE — Assessment & Plan Note (Signed)
Systolic blood pressures mildly elevated today. Patient is very anxious about the current situation. I will not be changing his blood pressure medicines.

## 2013-06-26 NOTE — Progress Notes (Signed)
HPI  Patient is seen today for cardiac clearance for knee surgery. He's had some chest pain in the past. Nuclear scan in the past was normal. He was seen last in the office in November, 2013. At that time he was stable after complex brain surgery. He had an echo in December, 2013. Wall motion was normal. He recently had a nuclear scan. I believe it was arranged by his primary physician for the question of clearance for knee surgery. Report gives the impression of some possible areas of scar. There was no ischemia. The description of the questionable abnormalities does not fit any definite coronary distribution. I suspect that this does not represent a significant problem.  The patient recently had successful back surgery also. He now needs knee surgery. He is not having any significant chest pain or shortness of breath.  Allergies  Allergen Reactions  . Codeine     REACTION: groggy  . Tape Other (See Comments)    Skin tears    Current Outpatient Prescriptions  Medication Sig Dispense Refill  . acetaminophen (TYLENOL) 325 MG tablet Take 325 mg by mouth every 6 (six) hours as needed for pain.       Marland Kitchen amLODipine (NORVASC) 2.5 MG tablet Take 2.5 mg by mouth every morning.      . calcium carbonate (OS-CAL) 600 MG TABS Take 600 mg by mouth daily.       . chlorthalidone (HYGROTON) 25 MG tablet Take 25 mg by mouth every morning.      . diltiazem (CARDIZEM CD) 120 MG 24 hr capsule Take 120 mg by mouth 2 (two) times daily.      . fexofenadine (ALLEGRA) 180 MG tablet Take 180 mg by mouth daily as needed. For allergies      . latanoprost (XALATAN) 0.005 % ophthalmic solution Place 1 drop into both eyes at bedtime.       Marland Kitchen LORazepam (ATIVAN) 1 MG tablet Take 1 mg by mouth at bedtime.       Marland Kitchen losartan (COZAAR) 50 MG tablet Take 50 mg by mouth every morning.      . Multiple Vitamins-Minerals (CENTRUM SILVER PO) Take 1 tablet by mouth daily.        Marland Kitchen omeprazole (PRILOSEC) 20 MG capsule Take 20 mg by  mouth daily. Take 1 tablet once daily thirty minutes before a meal      . oxyCODONE-acetaminophen (PERCOCET/ROXICET) 5-325 MG per tablet Take 1 tablet by mouth every 6 (six) hours as needed for pain.      . potassium chloride SA (K-DUR,KLOR-CON) 20 MEQ tablet Take 20 mEq by mouth daily.      . pravastatin (PRAVACHOL) 20 MG tablet Take 20 mg by mouth at bedtime.        . quinapril (ACCUPRIL) 40 MG tablet Take 40 mg by mouth 2 (two) times daily.      . vitamin A 8000 UNIT capsule Take 16,000 Units by mouth daily.        Marland Kitchen zolpidem (AMBIEN) 5 MG tablet Take 5 mg by mouth at bedtime as needed for sleep.       No current facility-administered medications for this visit.    History   Social History  . Marital Status: Divorced    Spouse Name: N/A    Number of Children: N/A  . Years of Education: N/A   Occupational History  . Not on file.   Social History Main Topics  . Smoking status: Former Smoker -- 1.50 packs/day  for 30 years    Types: Cigarettes    Quit date: 10/15/1986  . Smokeless tobacco: Never Used  . Alcohol Use: No     Comment: Used to drink heavily at times  . Drug Use: No  . Sexual Activity: Not on file   Other Topics Concern  . Not on file   Social History Narrative   Co-dependent relationship with his 14 year old son who has drug and financial problems    Family History  Problem Relation Age of Onset  . Cancer Father     oral cancer  . Breast cancer Mother   . Heart attack Mother   . Hypertension Sister     Bypass x4  . Alcohol abuse Sister   . Alzheimer's disease Sister   . Kidney disease Sister     Past Medical History  Diagnosis Date  . Anxiety     codependent relationship with irresponsible son  . Depression   . GERD (gastroesophageal reflux disease)   . Hyperlipidemia   . Hypertension December 29, 2009    renal artery Doppler , normal  12/2009  . Abdominal aorta injury     Normal size, ultrasound, March 17,2011  . Asbestos exposure     Hx of  asbestos related pleural plaques  . Overactive bladder   . ED (erectile dysfunction)   . Arthritis   . Hearing loss in left ear   . Baker's cyst     Left leg  . SVT (supraventricular tachycardia)     AVNRT, AV nodal reentry tachycardia  . Allergic rhinitis   . OSA (obstructive sleep apnea)     moderate -NPSG 03/02/08 AHI 23 cpap 8..  . Syncope December 2010    09/2009  felt to be vasovagal or micturition syncope  . Chest pain     nuclear, 10/2007, normal  . Ejection fraction     60%, echo, 2008, mild RV dysfunction  . Easy bruisability   . H/O sleep apnea     resolved with weight loss  . BPH (benign prostatic hyperplasia)     Past Surgical History  Procedure Laterality Date  . Cholecystectomy  6/98  . Craniectomy for excision of acoustic neuroma  3/95  . Trigger finger release  2003    (thumb) middle finger (2006)  . Cerebral embolization  12/2011  . Brain surgery      Patient Active Problem List   Diagnosis Date Noted  . Chronic insomnia 09/29/2012  . Cardiac murmur 08/27/2012  . Hearing impaired 04/10/2012  . Easy bruisability   . Anxiety   . GERD (gastroesophageal reflux disease)   . Hyperlipidemia   . Abdominal aorta injury   . Asbestos exposure   . Overactive bladder   . ED (erectile dysfunction)   . SVT (supraventricular tachycardia)   . OSA (obstructive sleep apnea)   . Chest tightness   . Ejection fraction   . Hypertension 12/29/2009  . Syncope 09/14/2009  . COPD 07/15/2009  . TOBACCO ABUSE, HX OF 07/15/2009  . BACK PAIN 06/22/2009  . INTESTINAL GAS 06/22/2009  . 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 ALLERGIC RHINITIS 01/15/2008  . ECZEMA 11/18/2007  . ASTHMA, MILD 11/03/2007  . GERD 04/14/2007  . OVERACTIVE BLADDER 04/14/2007  . ARTHRITIS 04/14/2007  . KNEE PAIN, LEFT 04/14/2007    ROS   Patient denies fever, chills, headache, sweats, rash, change in vision, change in hearing, chest pain, cough, nausea vomiting, urinary  symptoms. All  other systems are reviewed and are negative.    PHYSICAL EXAM  Patient is oriented to person time and place. Affect is normal. He's here with his fianc. He is anxious has always. There is no jugulovenous distention. Lungs are clear. Respiratory effort is nonlabored. Cardiac exam reveals S1 and S2. There no clicks or significant murmurs. The abdomen is soft. There is no peripheral edema. There is no musculoskeletal deformities. There are no skin rashes.  Filed Vitals:   06/26/13 1303  BP: 165/81  Pulse: 57  Height: 5\' 8"  (1.727 m)  Weight: 208 lb 12.8 oz (94.711 kg)  SpO2: 98%   EKG is done today and reviewed by me. There is no significant change. There is no significant abnormality.  ASSESSMENT & PLAN

## 2013-06-26 NOTE — Assessment & Plan Note (Signed)
The purpose of today's visit is to assess him on a preoperative basis before knee surgery. I feel that he is stable. He is cleared from the cardiac viewpoint for knee surgery. We know he has normal left ventricular function. In the first lines of this note above I described my impression of his nuclear scan. There is no ischemia. I am not convinced that there is any significant real abnormality. I feel that further cardiac workup is not needed. He is not having any significant symptoms. He is cleared to have his knee surgery.

## 2013-06-26 NOTE — Assessment & Plan Note (Signed)
The patient has always been very anxious. He's having trouble sleeping now getting ready for his knee surgery. I am willing to give him a higher dose of Ambien but only for one prescription.

## 2013-06-26 NOTE — Patient Instructions (Addendum)
Your physician recommends that you schedule a follow-up appointment in: 6 months. You will receive a reminder letter in the mail in about 4 months reminding you to call and schedule your appointment. If you don't receive this letter, please contact our office. Your physician recommends that you continue on your current medications as directed. Please refer to the Current Medication list given to you today.  You have been given a prescription today for ambien 10 mg daily at bedtime as needed for sleep. No future refills will be given. You are cleared for your knee surgery. Dr. Despina Hick has been contacted already.

## 2013-06-30 ENCOUNTER — Encounter (HOSPITAL_COMMUNITY): Payer: Self-pay

## 2013-06-30 ENCOUNTER — Encounter (HOSPITAL_COMMUNITY)
Admission: RE | Admit: 2013-06-30 | Discharge: 2013-06-30 | Disposition: A | Discharge status: Home / Self Care | Payer: Medicare Other | Source: Ambulatory Visit | Attending: Orthopedic Surgery | Admitting: Orthopedic Surgery

## 2013-06-30 DIAGNOSIS — Z01812 Encounter for preprocedural laboratory examination: Secondary | ICD-10-CM | POA: Diagnosis not present

## 2013-06-30 DIAGNOSIS — Z01818 Encounter for other preprocedural examination: Secondary | ICD-10-CM | POA: Insufficient documentation

## 2013-06-30 LAB — URINALYSIS, ROUTINE W REFLEX MICROSCOPIC
Bilirubin Urine: NEGATIVE
Hgb urine dipstick: NEGATIVE
Ketones, ur: NEGATIVE mg/dL
Nitrite: NEGATIVE
Specific Gravity, Urine: 1.012 (ref 1.005–1.030)
Urobilinogen, UA: 0.2 mg/dL (ref 0.0–1.0)

## 2013-06-30 LAB — APTT: aPTT: 32 seconds (ref 24–37)

## 2013-06-30 LAB — COMPREHENSIVE METABOLIC PANEL
Albumin: 3.6 g/dL (ref 3.5–5.2)
BUN: 13 mg/dL (ref 6–23)
Calcium: 9.4 mg/dL (ref 8.4–10.5)
Creatinine, Ser: 0.69 mg/dL (ref 0.50–1.35)
GFR calc Af Amer: >90 mL/min (ref >90)
Glucose, Bld: 98 mg/dL (ref 70–99)
Potassium: 3.7 mEq/L (ref 3.5–5.1)
Total Protein: 7.2 g/dL (ref 6.0–8.3)

## 2013-06-30 LAB — CBC
HCT: 38.9 % — ABNORMAL LOW (ref 39.0–52.0)
Hemoglobin: 13.6 g/dL (ref 13.0–17.0)
MCH: 30.2 pg (ref 26.0–34.0)
MCHC: 35 g/dL (ref 30.0–36.0)
RDW: 14 % (ref 11.5–15.5)

## 2013-06-30 LAB — PROTIME-INR
INR: 1.07 (ref 0.00–1.49)
Prothrombin Time: 13.7 seconds (ref 11.6–15.2)

## 2013-06-30 LAB — SURGICAL PCR SCREEN: Staphylococcus aureus: NEGATIVE

## 2013-06-30 NOTE — Patient Instructions (Addendum)
20 Julian Fowler  06/30/2013   Your procedure is scheduled on:  07/06/13  MONDAY  Report to [facility] at    0500   AM.  Call this number if you have problems the morning of surgery: [phone]       Remember:   Do not eat food  Or drink :After Midnight. Sunday NIGHT   Take these medicines the morning of surgery with A SIP OF WATER: Amlodipine, Diltiazem, Omeprazole May take OXYCODONE if needed  .  Contacts, dentures or partial plates can not be worn to surgery  Leave suitcase in the car. After surgery it may be brought to your room.  For patients admitted to the hospital, checkout time is 11:00 AM day of  discharge.             SPECIAL INSTRUCTIONS- SEE [place] PREPARING FOR SURGERY INSTRUCTION SHEET-     DO NOT WEAR JEWELRY, LOTIONS, POWDERS, OR PERFUMES.  WOMEN-- DO NOT SHAVE LEGS OR UNDERARMS FOR 12 HOURS BEFORE SHOWERS. MEN MAY SHAVE FACE.  Patients discharged the day of surgery will not be allowed to drive home. IF going home the day of surgery, you must have a driver and someone to stay with you for the first 24 hours  Name and phone number of your driver:       ADMISSION                                                                 Please read over the following fact sheets that you were given: MRSA Information, Incentive Spirometry Sheet, Blood Transfusion Sheet  Information                                                                                   [place]  PST [phone]  [phone]                 FAILURE TO FOLLOW THESE INSTRUCTIONS MAY RESULT IN  CANCELLATION   OF YOUR SURGERY                                                  Patient Signature _____________________________

## 2013-06-30 NOTE — Progress Notes (Signed)
Chest x ray, eccho 8/13, OV Dr Maple Hudson 12/12, Stress test, EKG 8/14, OV with clearance Dr Myrtis Ser  ALL IN [facility]

## 2013-06-30 NOTE — Progress Notes (Signed)
Chest x ray 12/13

## 2013-07-01 ENCOUNTER — Ambulatory Visit: Payer: Medicare Other | Admitting: Nurse Practitioner

## 2013-07-01 NOTE — Progress Notes (Signed)
Faxed CMP to Dr Lequita Halt through [employer]

## 2013-07-02 NOTE — Progress Notes (Signed)
Clearance with note Dr Cathie Beams on chart

## 2013-07-03 NOTE — Progress Notes (Signed)
Pre faxed note D Perkins-  Repeat BMET day of surgery.

## 2013-07-05 ENCOUNTER — Other Ambulatory Visit: Payer: Self-pay | Admitting: Orthopedic Surgery

## 2013-07-05 NOTE — H&P (Signed)
Julian Fowler   DOB: Apr 10, 1935  Single / Language: [place] / Race: Refused to Report/Unreported  Male  Date of Admission: 07/06/2013  Chief Complaint: Left Knee Pain   History of Present Illness  The patient is a 78 year old male who comes in for a preoperative History and Physical. The patient is scheduled for a left total knee arthroplasty to be performed by Dr. Gus Rankin. Aluisio, MD at [facility] on 07/06/2013.  The patient is a 78 year old male who presents with knee complaints. The patient reports left knee symptoms including: pain, instability, weakness, stiffness and grinding which began year(s) ago without any known injury.The patient feels that the symptoms are worsening. The patient has the current diagnosis of knee osteoarthritis. Prior to being seen today the patient was previously evaluated in this clinic 6 year(s) ago. Previous work-up for this problem has included knee x-rays. Note for "Knee pain": He reports that he has lost about 100 lbs. since his last visit here due to his own home exercise program, mostly swimming.  He has a lot of functional issues with the left knee. He has a hard time getting on and off his riding mower and tractor. He says he takes care of two yards throughout the spring and summer. He feels that the knee is getting progressively worse. He says he can tolerate the pain for the most part. He is not having any significant swelling in the right knee but he does get swelling in the left knee. He is not having any hip pain associated with this. There is no back pain associated with it. He is ready to proceed with knee procedure.  They have been treated conservatively in the past for the above stated problem and despite conservative measures, they continue to have progressive pain and severe functional limitations and dysfunction. They have failed non-operative management including home exercise, medications, and injections. It is felt that they would benefit  from undergoing total joint replacement. Risks and benefits of the procedure have been discussed with the patient and they elect to proceed with surgery. There are no active contraindications to surgery such as ongoing infection or rapidly progressive neurological disease.   Problem List  Primary osteoarthritis of one knee   Allergies  Codeine Derivatives   Family History  Cancer. mother and father  Chronic Obstructive Lung Disease. sister  Congestive Heart Failure. mother  Drug / Alcohol Addiction. father  First Degree Relatives. reported  Hypertension. mother and brother   Social History  Former smoker  Illicit drug use. no  Living situation. live alone  Drug/Alcohol Rehab (Currently). no  Drug/Alcohol Rehab (Previously). no  Exercise. Exercises daily; does individual sport  Marital status. divorced  Tobacco use. former smoker; smoke(d) 1 pack(s) per day  Number of flights of stairs before winded. greater than 5  Pain Contract. no  Tobacco / smoke exposure. no  Current work status. retired  Alcohol use. former drinker  Children. 2  Post-Surgical Plans. Plan is to look into [facility]   Medication History  Tylenol (325MG  Tablet, Oral) Active.  AmLODIPine Besylate (2.5MG  Tablet, Oral) Active.  Calcium Carbonate ( Oral) Specific dose unknown - Active.  Chlorthalidone (25MG  Tablet, Oral) Active.  Diltiazem HCl ER Coated Beads (120MG  Capsule ER 24HR, Oral) Active.  Fexofenadine HCl (180MG  Tablet, Oral) Active.  LORazepam (1MG  Tablet, Oral) Active.  Losartan Potassium (50MG  Tablet, Oral) Active.  Centrum Silver ( Oral) Active.  PriLOSEC (20MG  Capsule DR, Oral) Active.  Klor-Con  M20 ( Tablet ER, Oral) Active.  Pravastatin Sodium (20MG  Tablet, Oral) Active.  Quinapril HCl (40MG  Tablet, Oral) Active.  Vitamin A (8000UNIT Capsule, Oral) Active.  Zolpidem Tartrate (5MG  Tablet, Oral) Active.  Latanoprost (0.005% Solution, Ophthalmic) Active.   Past Surgical History   Gallbladder Surgery. open  Other Orthopaedic Surgery  Vasectomy   Medical History  High blood pressure  Gastroesophageal Reflux Disease  Impaired Hearing. 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 Surgery 1995  Shingles  Tinnitus  Asbestosis  Sleep Apnea. Does not use CPAP  Hypertension  Hiatal Hernia   Review of Systems  General: Not Present- Chills, Fever, Night Sweats, Fatigue, Weight Gain, Weight Loss and Memory Loss.  Skin: Not Present- Hives, Itching, Rash, Eczema and Lesions.  HEENT: Present- Hearing problems. Not Present- Tinnitus, Headache, Double Vision, Visual Loss, Hearing Loss and Dentures.  Respiratory: Not Present- Shortness of breath with exertion, Shortness of breath at rest, Allergies, Coughing up blood and Chronic Cough.  Cardiovascular: Not Present- Chest Pain, Racing/skipping heartbeats, Difficulty Breathing Lying Down, Murmur, Swelling and Palpitations.  Gastrointestinal: Not Present- Bloody Stool, Heartburn, Abdominal Pain, Vomiting, Nausea, Constipation, Diarrhea, Difficulty Swallowing, Jaundice and Loss of appetitie.  Male Genitourinary: Present- Urinary frequency and Urinating at Night. Not Present- Blood in Urine, Weak urinary stream, Discharge, Flank Pain, Incontinence, Painful Urination, Urgency and Urinary Retention.  Musculoskeletal: Present- Joint Pain and Back Pain. Not Present- Muscle Weakness, Muscle Pain, Joint Swelling, Morning Stiffness and Spasms.  Neurological: Not Present- Tremor, Dizziness, Blackout spells, Paralysis, Difficulty with balance and Weakness.  Psychiatric: Not Present- Insomnia.   Vitals  Weight: 205 lb Height: 70 in  Body Surface Area: 2.14 m Body Mass Index: 29.41 kg/m  Pulse: 56 (Regular) Resp.: 12 (Unlabored)  BP: 142/80 (Sitting, Right Arm, Standard)   Physical Exam  The physical exam findings are as follows:  General  Mental Status - Alert, cooperative and good historian. General  Appearance - pleasant. Not in acute distress. Orientation - Oriented X3. Build & Nutrition - Well nourished and Well developed.  Head and Neck  Head - normocephalic, atraumatic .  Neck  Global Assessment - supple. no bruit auscultated on the right and no bruit auscultated on the left.  Note: upper and lower dentures  Eye  Pupil - Bilateral - Regular and Round.  Motion - Bilateral - EOMI.  Chest and Lung Exam  Auscultation:  Breath sounds: - clear at anterior chest wall and - clear at posterior chest wall.  Adventitious sounds: - No Adventitious sounds.  Cardiovascular  Auscultation: Rhythm - Regular rate and rhythm. Heart Sounds - S1 WNL and S2 WNL.  Murmurs & Other Heart Sounds:  Murmur 1: Location - Aortic Area. Timing - Mid-systolic. Grade - III/VI. Character - Blowing.  Abdomen  Palpation/Percussion: Tenderness - Abdomen is non-tender to palpation. Rigidity (guarding) - Abdomen is soft.  Auscultation: Auscultation of the abdomen reveals - Bowel sounds normal.  Male Genitourinary  Not done, not pertinent to present illness  Musculoskeletal  On exam, he is alert and oriented in no apparent distress. His hips show normal range of motion with no discomfort. His right knee shows no effusion. There is no deformity. Slight crepitus on range of motion. Range is 5-125 with no instability. The left knee showed a large cyst at the lateral joint line consistent with a meniscal cyst. He has significant flexion and varus deformity. Range is about 10-120. He has marked crepitus on range of  motion. He is tender medial greater than lateral with no instability noted. Pulse, sensation, and motor are intact.   RADIOGRAPHS:  AP both knees and lateral show slight arthritis in the right knee. His left knee has severe bone on bone with significant erosion of the medial compartment. He also has severe patellofemoral disease. He has large osteophytes.   Assessment & Plan  Primary osteoarthritis of one knee  (715.16)   Impression: Left Knee  Note: Plan is for a Left Total Knee Replacement by Dr. Lequita Halt.   Plan wants to look into [facility].   PCP - Dr. Leandrew Koyanagi  Patient had back surgery just under two months ago for a ruptured disc by Dr. Channing Mutters and was given the verbal go ahead by Dr. Channing Mutters to proceed with his knee surgery.  The patient does not have any contraindications and will recieve TXA (tranexamic acid) prior to surgery.   Signed electronically by Lauraine Rinne, III PA-C

## 2013-07-06 ENCOUNTER — Encounter (HOSPITAL_COMMUNITY)
Admission: RE | Disposition: A | Discharge status: Skilled Nursing Facility | Payer: Self-pay | Source: Ambulatory Visit | Attending: Orthopedic Surgery

## 2013-07-06 ENCOUNTER — Encounter (HOSPITAL_COMMUNITY): Payer: Self-pay | Admitting: Anesthesiology

## 2013-07-06 ENCOUNTER — Encounter (HOSPITAL_COMMUNITY): Payer: Self-pay | Admitting: *Deleted

## 2013-07-06 ENCOUNTER — Inpatient Hospital Stay (HOSPITAL_COMMUNITY): Payer: Medicare Other | Admitting: Anesthesiology

## 2013-07-06 ENCOUNTER — Inpatient Hospital Stay (HOSPITAL_COMMUNITY)
Admission: RE | Admit: 2013-07-06 | Discharge: 2013-07-09 | DRG: 470 | Disposition: A | Discharge status: Skilled Nursing Facility | Payer: Medicare Other | Source: Ambulatory Visit | Attending: Orthopedic Surgery | Admitting: Orthopedic Surgery

## 2013-07-06 DIAGNOSIS — F411 Generalized anxiety disorder: Secondary | ICD-10-CM | POA: Diagnosis present

## 2013-07-06 DIAGNOSIS — E876 Hypokalemia: Secondary | ICD-10-CM | POA: Diagnosis not present

## 2013-07-06 DIAGNOSIS — F3289 Other specified depressive episodes: Secondary | ICD-10-CM | POA: Diagnosis present

## 2013-07-06 DIAGNOSIS — E871 Hypo-osmolality and hyponatremia: Secondary | ICD-10-CM | POA: Diagnosis not present

## 2013-07-06 DIAGNOSIS — E785 Hyperlipidemia, unspecified: Secondary | ICD-10-CM | POA: Diagnosis present

## 2013-07-06 DIAGNOSIS — I714 Abdominal aortic aneurysm, without rupture: Secondary | ICD-10-CM | POA: Diagnosis not present

## 2013-07-06 DIAGNOSIS — H9190 Unspecified hearing loss, unspecified ear: Secondary | ICD-10-CM | POA: Diagnosis present

## 2013-07-06 DIAGNOSIS — R279 Unspecified lack of coordination: Secondary | ICD-10-CM | POA: Diagnosis not present

## 2013-07-06 DIAGNOSIS — Z79899 Other long term (current) drug therapy: Secondary | ICD-10-CM | POA: Diagnosis not present

## 2013-07-06 DIAGNOSIS — IMO0002 Reserved for concepts with insufficient information to code with codable children: Secondary | ICD-10-CM | POA: Diagnosis not present

## 2013-07-06 DIAGNOSIS — Z77090 Contact with and (suspected) exposure to asbestos: Secondary | ICD-10-CM

## 2013-07-06 DIAGNOSIS — M6281 Muscle weakness (generalized): Secondary | ICD-10-CM | POA: Diagnosis not present

## 2013-07-06 DIAGNOSIS — G4733 Obstructive sleep apnea (adult) (pediatric): Secondary | ICD-10-CM | POA: Diagnosis present

## 2013-07-06 DIAGNOSIS — R41841 Cognitive communication deficit: Secondary | ICD-10-CM | POA: Diagnosis not present

## 2013-07-06 DIAGNOSIS — Z96652 Presence of left artificial knee joint: Secondary | ICD-10-CM

## 2013-07-06 DIAGNOSIS — M1710 Unilateral primary osteoarthritis, unspecified knee: Secondary | ICD-10-CM | POA: Diagnosis not present

## 2013-07-06 DIAGNOSIS — Z471 Aftercare following joint replacement surgery: Secondary | ICD-10-CM | POA: Diagnosis not present

## 2013-07-06 DIAGNOSIS — N400 Enlarged prostate without lower urinary tract symptoms: Secondary | ICD-10-CM | POA: Diagnosis present

## 2013-07-06 DIAGNOSIS — F329 Major depressive disorder, single episode, unspecified: Secondary | ICD-10-CM | POA: Diagnosis present

## 2013-07-06 DIAGNOSIS — Z96659 Presence of unspecified artificial knee joint: Secondary | ICD-10-CM | POA: Diagnosis not present

## 2013-07-06 DIAGNOSIS — I10 Essential (primary) hypertension: Secondary | ICD-10-CM | POA: Diagnosis not present

## 2013-07-06 DIAGNOSIS — M25569 Pain in unspecified knee: Secondary | ICD-10-CM | POA: Diagnosis not present

## 2013-07-06 DIAGNOSIS — M179 Osteoarthritis of knee, unspecified: Secondary | ICD-10-CM | POA: Diagnosis present

## 2013-07-06 DIAGNOSIS — K219 Gastro-esophageal reflux disease without esophagitis: Secondary | ICD-10-CM | POA: Diagnosis not present

## 2013-07-06 DIAGNOSIS — Z87891 Personal history of nicotine dependence: Secondary | ICD-10-CM | POA: Diagnosis not present

## 2013-07-06 DIAGNOSIS — R262 Difficulty in walking, not elsewhere classified: Secondary | ICD-10-CM | POA: Diagnosis not present

## 2013-07-06 HISTORY — PX: TOTAL KNEE ARTHROPLASTY: SHX125

## 2013-07-06 LAB — TYPE AND SCREEN
ABO/RH(D): A POS
Antibody Screen: NEGATIVE

## 2013-07-06 LAB — BASIC METABOLIC PANEL
BUN: 12 mg/dL (ref 6–23)
CO2: 25 mEq/L (ref 19–32)
Chloride: 91 mEq/L — ABNORMAL LOW (ref 96–112)
Creatinine, Ser: 0.65 mg/dL (ref 0.50–1.35)
GFR calc Af Amer: >90 mL/min (ref >90)
Potassium: 2.8 mEq/L — ABNORMAL LOW (ref 3.5–5.1)

## 2013-07-06 SURGERY — ARTHROPLASTY, KNEE, TOTAL
Anesthesia: General | Site: Knee | Laterality: Left | Wound class: Clean

## 2013-07-06 MED ORDER — DILTIAZEM HCL ER COATED BEADS 120 MG PO CP24
120.0000 mg | ORAL_CAPSULE | Freq: Two times a day (BID) | ORAL | Status: DC
  Administered 2013-07-06 – 2013-07-09 (×6): 120 mg via ORAL
  Filled 2013-07-06 (×8): qty 1

## 2013-07-06 MED ORDER — SODIUM CHLORIDE 0.9 % IJ SOLN
INTRAMUSCULAR | Status: DC | PRN
  Administered 2013-07-06: 08:00:00

## 2013-07-06 MED ORDER — ONDANSETRON HCL 4 MG PO TABS: 4.0000 mg | ORAL_TABLET | Freq: Four times a day (QID) | ORAL | Status: DC | PRN

## 2013-07-06 MED ORDER — KCL IN DEXTROSE-NACL 20-5-0.9 MEQ/L-%-% IV SOLN
INTRAVENOUS | Status: AC
  Filled 2013-07-06: qty 1000

## 2013-07-06 MED ORDER — BUPIVACAINE HCL 0.25 % IJ SOLN
INTRAMUSCULAR | Status: DC | PRN
  Administered 2013-07-06: 20 mL

## 2013-07-06 MED ORDER — LACTATED RINGERS IV SOLN
INTRAVENOUS | Status: DC | PRN
  Administered 2013-07-06 (×2): via INTRAVENOUS

## 2013-07-06 MED ORDER — MENTHOL 3 MG MT LOZG: 1.0000 | LOZENGE | OROMUCOSAL | Status: DC | PRN

## 2013-07-06 MED ORDER — BUPIVACAINE LIPOSOME 1.3 % IJ SUSP
20.0000 mL | Freq: Once | INTRAMUSCULAR | Status: DC
  Filled 2013-07-06: qty 20

## 2013-07-06 MED ORDER — LORAZEPAM 1 MG PO TABS
1.0000 mg | ORAL_TABLET | Freq: Every day | ORAL | Status: DC
  Administered 2013-07-06 – 2013-07-08 (×3): 1 mg via ORAL
  Filled 2013-07-06 (×3): qty 1

## 2013-07-06 MED ORDER — CEFAZOLIN SODIUM 1-5 GM-% IV SOLN
1.0000 g | Freq: Four times a day (QID) | INTRAVENOUS | Status: AC
  Administered 2013-07-06 (×2): 1 g via INTRAVENOUS
  Filled 2013-07-06 (×2): qty 50

## 2013-07-06 MED ORDER — POLYETHYLENE GLYCOL 3350 17 G PO PACK
17.0000 g | PACK | Freq: Every day | ORAL | Status: DC | PRN
  Administered 2013-07-07: 17 g via ORAL

## 2013-07-06 MED ORDER — ACETAMINOPHEN 650 MG RE SUPP: 650.0000 mg | Freq: Four times a day (QID) | RECTAL | Status: DC | PRN

## 2013-07-06 MED ORDER — HYDROMORPHONE HCL PF 1 MG/ML IJ SOLN
0.2500 mg | INTRAMUSCULAR | Status: DC | PRN
  Administered 2013-07-06 (×4): 0.5 mg via INTRAVENOUS

## 2013-07-06 MED ORDER — HYDROMORPHONE HCL PF 1 MG/ML IJ SOLN
INTRAMUSCULAR | Status: AC
  Filled 2013-07-06: qty 2

## 2013-07-06 MED ORDER — TRAMADOL HCL 50 MG PO TABS
100.0000 mg | ORAL_TABLET | Freq: Four times a day (QID) | ORAL | Status: DC
  Administered 2013-07-06 – 2013-07-08 (×10): 100 mg via ORAL
  Filled 2013-07-06 (×11): qty 2

## 2013-07-06 MED ORDER — DEXAMETHASONE 4 MG PO TABS
10.0000 mg | ORAL_TABLET | Freq: Every day | ORAL | Status: AC
  Administered 2013-07-07: 12:00:00 10 mg via ORAL
  Filled 2013-07-06: qty 1

## 2013-07-06 MED ORDER — SODIUM CHLORIDE 0.9 % IV SOLN: INTRAVENOUS | Status: DC

## 2013-07-06 MED ORDER — LATANOPROST 0.005 % OP SOLN
1.0000 [drp] | Freq: Every day | OPHTHALMIC | Status: DC
  Administered 2013-07-06 – 2013-07-08 (×3): 1 [drp] via OPHTHALMIC
  Filled 2013-07-06: qty 2.5

## 2013-07-06 MED ORDER — ONDANSETRON HCL 4 MG/2ML IJ SOLN
4.0000 mg | Freq: Four times a day (QID) | INTRAMUSCULAR | Status: DC | PRN
  Administered 2013-07-06: 12:00:00 4 mg via INTRAVENOUS
  Filled 2013-07-06: qty 2

## 2013-07-06 MED ORDER — ACETAMINOPHEN 325 MG PO TABS
650.0000 mg | ORAL_TABLET | Freq: Four times a day (QID) | ORAL | Status: DC | PRN
  Administered 2013-07-07 – 2013-07-08 (×4): 650 mg via ORAL
  Filled 2013-07-06 (×4): qty 2

## 2013-07-06 MED ORDER — METHOCARBAMOL 500 MG PO TABS
500.0000 mg | ORAL_TABLET | Freq: Four times a day (QID) | ORAL | Status: DC | PRN
  Administered 2013-07-06 – 2013-07-07 (×2): 500 mg via ORAL
  Filled 2013-07-06 (×2): qty 1

## 2013-07-06 MED ORDER — CEFAZOLIN SODIUM-DEXTROSE 2-3 GM-% IV SOLR
2.0000 g | INTRAVENOUS | Status: AC
  Administered 2013-07-06: 2 g via INTRAVENOUS

## 2013-07-06 MED ORDER — DIPHENHYDRAMINE HCL 12.5 MG/5ML PO ELIX: 12.5000 mg | ORAL_SOLUTION | ORAL | Status: DC | PRN

## 2013-07-06 MED ORDER — POTASSIUM CHLORIDE 10 MEQ/100ML IV SOLN
10.0000 meq | INTRAVENOUS | Status: AC
  Administered 2013-07-06 (×2): 10 meq via INTRAVENOUS
  Filled 2013-07-06 (×2): qty 100

## 2013-07-06 MED ORDER — LACTATED RINGERS IV SOLN: INTRAVENOUS | Status: DC

## 2013-07-06 MED ORDER — METOCLOPRAMIDE HCL 10 MG PO TABS: 5.0000 mg | ORAL_TABLET | Freq: Three times a day (TID) | ORAL | Status: DC | PRN

## 2013-07-06 MED ORDER — TRANEXAMIC ACID 100 MG/ML IV SOLN
1000.0000 mg | INTRAVENOUS | Status: AC
  Administered 2013-07-06: 1000 mg via INTRAVENOUS
  Filled 2013-07-06: qty 10

## 2013-07-06 MED ORDER — ALUM HYDROXIDE-MAG TRISILICATE 80-20 MG PO CHEW
1.0000 | CHEWABLE_TABLET | Freq: Four times a day (QID) | ORAL | Status: DC | PRN
  Administered 2013-07-06: 1 via ORAL
  Filled 2013-07-06 (×2): qty 2

## 2013-07-06 MED ORDER — MIDAZOLAM HCL 5 MG/5ML IJ SOLN
INTRAMUSCULAR | Status: DC | PRN
  Administered 2013-07-06: 2 mg via INTRAVENOUS

## 2013-07-06 MED ORDER — DEXLANSOPRAZOLE 60 MG PO CPDR
60.0000 mg | DELAYED_RELEASE_CAPSULE | Freq: Every day | ORAL | Status: DC | PRN
  Filled 2013-07-06 (×2): qty 1

## 2013-07-06 MED ORDER — METHOCARBAMOL 100 MG/ML IJ SOLN
500.0000 mg | Freq: Four times a day (QID) | INTRAVENOUS | Status: DC | PRN
  Administered 2013-07-06: 500 mg via INTRAVENOUS
  Filled 2013-07-06: qty 5

## 2013-07-06 MED ORDER — DEXAMETHASONE SODIUM PHOSPHATE 10 MG/ML IJ SOLN
10.0000 mg | Freq: Once | INTRAMUSCULAR | Status: AC
  Administered 2013-07-06: 10 mg via INTRAVENOUS

## 2013-07-06 MED ORDER — DOCUSATE SODIUM 100 MG PO CAPS
100.0000 mg | ORAL_CAPSULE | Freq: Two times a day (BID) | ORAL | Status: DC
  Administered 2013-07-06 – 2013-07-09 (×6): 100 mg via ORAL

## 2013-07-06 MED ORDER — RIVAROXABAN 10 MG PO TABS
10.0000 mg | ORAL_TABLET | Freq: Every day | ORAL | Status: DC
  Administered 2013-07-07 – 2013-07-09 (×3): 10 mg via ORAL
  Filled 2013-07-06 (×4): qty 1

## 2013-07-06 MED ORDER — POTASSIUM CHLORIDE CRYS ER 20 MEQ PO TBCR
20.0000 meq | EXTENDED_RELEASE_TABLET | Freq: Every day | ORAL | Status: DC
  Administered 2013-07-06: 14:00:00 20 meq via ORAL
  Filled 2013-07-06 (×2): qty 1

## 2013-07-06 MED ORDER — ACETAMINOPHEN 500 MG PO TABS
1000.0000 mg | ORAL_TABLET | Freq: Four times a day (QID) | ORAL | Status: AC
  Administered 2013-07-06 – 2013-07-07 (×4): 1000 mg via ORAL
  Filled 2013-07-06 (×4): qty 2

## 2013-07-06 MED ORDER — CHLORTHALIDONE 25 MG PO TABS
25.0000 mg | ORAL_TABLET | Freq: Every morning | ORAL | Status: DC
  Administered 2013-07-06: 25 mg via ORAL
  Filled 2013-07-06 (×2): qty 1

## 2013-07-06 MED ORDER — BISACODYL 10 MG RE SUPP: 10.0000 mg | Freq: Every day | RECTAL | Status: DC | PRN

## 2013-07-06 MED ORDER — ZOLPIDEM TARTRATE 10 MG PO TABS: 5.0000 mg | ORAL_TABLET | Freq: Every evening | ORAL | Status: DC | PRN

## 2013-07-06 MED ORDER — KCL IN DEXTROSE-NACL 20-5-0.9 MEQ/L-%-% IV SOLN
INTRAVENOUS | Status: DC
  Administered 2013-07-06: 22:00:00 via INTRAVENOUS
  Filled 2013-07-06 (×3): qty 1000

## 2013-07-06 MED ORDER — FENTANYL CITRATE 0.05 MG/ML IJ SOLN
INTRAMUSCULAR | Status: DC | PRN
  Administered 2013-07-06 (×2): 50 ug via INTRAVENOUS
  Administered 2013-07-06: 100 ug via INTRAVENOUS
  Administered 2013-07-06: 50 ug via INTRAVENOUS

## 2013-07-06 MED ORDER — BUPIVACAINE HCL (PF) 0.25 % IJ SOLN
INTRAMUSCULAR | Status: AC
  Filled 2013-07-06: qty 30

## 2013-07-06 MED ORDER — ACETAMINOPHEN 500 MG PO TABS
1000.0000 mg | ORAL_TABLET | Freq: Once | ORAL | Status: AC
  Administered 2013-07-06: 1000 mg via ORAL
  Filled 2013-07-06: qty 2

## 2013-07-06 MED ORDER — CEFAZOLIN SODIUM-DEXTROSE 2-3 GM-% IV SOLR
INTRAVENOUS | Status: AC
  Filled 2013-07-06: qty 50

## 2013-07-06 MED ORDER — STERILE WATER FOR IRRIGATION IR SOLN
Status: DC | PRN
  Administered 2013-07-06: 3000 mL

## 2013-07-06 MED ORDER — SODIUM CHLORIDE 0.9 % IJ SOLN
INTRAMUSCULAR | Status: AC
  Filled 2013-07-06: qty 50

## 2013-07-06 MED ORDER — 0.9 % SODIUM CHLORIDE (POUR BTL) OPTIME
TOPICAL | Status: DC | PRN
  Administered 2013-07-06: 1000 mL

## 2013-07-06 MED ORDER — KETOROLAC TROMETHAMINE 15 MG/ML IJ SOLN: 7.5000 mg | Freq: Four times a day (QID) | INTRAMUSCULAR | Status: AC | PRN

## 2013-07-06 MED ORDER — METOCLOPRAMIDE HCL 5 MG/ML IJ SOLN: 5.0000 mg | Freq: Three times a day (TID) | INTRAMUSCULAR | Status: DC | PRN

## 2013-07-06 MED ORDER — FLEET ENEMA 7-19 GM/118ML RE ENEM: 1.0000 | ENEMA | Freq: Once | RECTAL | Status: AC | PRN

## 2013-07-06 MED ORDER — AMLODIPINE BESYLATE 2.5 MG PO TABS
2.5000 mg | ORAL_TABLET | Freq: Every morning | ORAL | Status: DC
  Administered 2013-07-07 – 2013-07-09 (×3): 2.5 mg via ORAL
  Filled 2013-07-06 (×3): qty 1

## 2013-07-06 MED ORDER — PHENOL 1.4 % MT LIQD: 1.0000 | OROMUCOSAL | Status: DC | PRN

## 2013-07-06 MED ORDER — SUCCINYLCHOLINE CHLORIDE 20 MG/ML IJ SOLN
INTRAMUSCULAR | Status: DC | PRN
  Administered 2013-07-06: 100 mg via INTRAVENOUS

## 2013-07-06 MED ORDER — HYDROMORPHONE HCL PF 1 MG/ML IJ SOLN
INTRAMUSCULAR | Status: DC | PRN
  Administered 2013-07-06: 0.5 mg via INTRAVENOUS
  Administered 2013-07-06: 1 mg via INTRAVENOUS
  Administered 2013-07-06: 0.5 mg via INTRAVENOUS

## 2013-07-06 MED ORDER — LORATADINE 10 MG PO TABS
10.0000 mg | ORAL_TABLET | Freq: Every day | ORAL | Status: DC | PRN
  Filled 2013-07-06: qty 1

## 2013-07-06 MED ORDER — MORPHINE SULFATE 2 MG/ML IJ SOLN
1.0000 mg | INTRAMUSCULAR | Status: DC | PRN
  Administered 2013-07-06 (×2): 1 mg via INTRAVENOUS
  Administered 2013-07-06: 13:00:00 2 mg via INTRAVENOUS
  Filled 2013-07-06 (×3): qty 1

## 2013-07-06 MED ORDER — OXYCODONE HCL 5 MG PO TABS
5.0000 mg | ORAL_TABLET | ORAL | Status: DC | PRN
  Administered 2013-07-06 – 2013-07-07 (×3): 5 mg via ORAL
  Administered 2013-07-07 – 2013-07-09 (×5): 10 mg via ORAL
  Filled 2013-07-06: qty 2
  Filled 2013-07-06 (×2): qty 1
  Filled 2013-07-06: qty 2
  Filled 2013-07-06: qty 1
  Filled 2013-07-06 (×4): qty 2

## 2013-07-06 MED ORDER — SODIUM CHLORIDE 0.9 % IR SOLN
Status: DC | PRN
  Administered 2013-07-06: 1000 mL

## 2013-07-06 MED ORDER — PROPOFOL 10 MG/ML IV BOLUS
INTRAVENOUS | Status: DC | PRN
  Administered 2013-07-06: 150 mg via INTRAVENOUS

## 2013-07-06 MED ORDER — LOSARTAN POTASSIUM 50 MG PO TABS
50.0000 mg | ORAL_TABLET | Freq: Every morning | ORAL | Status: DC
  Administered 2013-07-06 – 2013-07-09 (×4): 50 mg via ORAL
  Filled 2013-07-06 (×4): qty 1

## 2013-07-06 MED ORDER — DEXAMETHASONE SODIUM PHOSPHATE 10 MG/ML IJ SOLN
10.0000 mg | Freq: Every day | INTRAMUSCULAR | Status: AC
  Filled 2013-07-06: qty 1

## 2013-07-06 SURGICAL SUPPLY — 59 items
BAG SPEC THK2 15X12 ZIP CLS (MISCELLANEOUS) ×1
BAG ZIPLOCK 12X15 (MISCELLANEOUS) ×2 IMPLANT
BANDAGE ELASTIC 6 VELCRO ST LF (GAUZE/BANDAGES/DRESSINGS) ×2 IMPLANT
BANDAGE ESMARK 6X9 LF (GAUZE/BANDAGES/DRESSINGS) ×1 IMPLANT
BLADE SAG 18X100X1.27 (BLADE) ×2 IMPLANT
BLADE SAW SGTL 11.0X1.19X90.0M (BLADE) ×2 IMPLANT
BNDG CMPR 9X6 STRL LF SNTH (GAUZE/BANDAGES/DRESSINGS) ×1
BNDG ESMARK 6X9 LF (GAUZE/BANDAGES/DRESSINGS) ×2
BOWL SMART MIX CTS (DISPOSABLE) ×2 IMPLANT
CAPT RP KNEE ×1 IMPLANT
CEMENT HV SMART SET (Cement) ×4 IMPLANT
CLOTH BEACON ORANGE TIMEOUT ST (SAFETY) ×2 IMPLANT
CUFF TOURN SGL QUICK 34 (TOURNIQUET CUFF) ×2
CUFF TRNQT CYL 34X4X40X1 (TOURNIQUET CUFF) ×1 IMPLANT
DECANTER SPIKE VIAL GLASS SM (MISCELLANEOUS) ×2 IMPLANT
DRAPE EXTREMITY T 121X128X90 (DRAPE) ×2 IMPLANT
DRAPE POUCH INSTRU U-SHP 10X18 (DRAPES) ×2 IMPLANT
DRAPE U-SHAPE 47X51 STRL (DRAPES) ×2 IMPLANT
DRSG ADAPTIC 3X8 NADH LF (GAUZE/BANDAGES/DRESSINGS) ×2 IMPLANT
DRSG MEPILEX BORDER 4X4 (GAUZE/BANDAGES/DRESSINGS) ×1 IMPLANT
DRSG MEPILEX BORDER 4X8 (GAUZE/BANDAGES/DRESSINGS) ×1 IMPLANT
DRSG PAD ABDOMINAL 8X10 ST (GAUZE/BANDAGES/DRESSINGS) ×2 IMPLANT
DURAPREP 26ML APPLICATOR (WOUND CARE) ×2 IMPLANT
ELECT REM PT RETURN 9FT ADLT (ELECTROSURGICAL) ×2
ELECTRODE REM PT RTRN 9FT ADLT (ELECTROSURGICAL) ×1 IMPLANT
EVACUATOR 1/8 PVC DRAIN (DRAIN) ×2 IMPLANT
FACESHIELD LNG OPTICON STERILE (SAFETY) ×10 IMPLANT
GLOVE BIO SURGEON STRL SZ7.5 (GLOVE) IMPLANT
GLOVE BIO SURGEON STRL SZ8 (GLOVE) ×2 IMPLANT
GLOVE BIOGEL PI IND STRL 8 (GLOVE) ×2 IMPLANT
GLOVE BIOGEL PI INDICATOR 8 (GLOVE) ×2
GLOVE SURG SS PI 6.5 STRL IVOR (GLOVE) IMPLANT
GOWN PREVENTION PLUS LG XLONG (DISPOSABLE) ×2 IMPLANT
GOWN STRL REIN XL XLG (GOWN DISPOSABLE) IMPLANT
HANDPIECE INTERPULSE COAX TIP (DISPOSABLE) ×2
IMMOBILIZER KNEE 20 (SOFTGOODS) ×2
IMMOBILIZER KNEE 20 THIGH 36 (SOFTGOODS) ×1 IMPLANT
KIT BASIN OR (CUSTOM PROCEDURE TRAY) ×2 IMPLANT
MANIFOLD NEPTUNE II (INSTRUMENTS) ×2 IMPLANT
NDL SAFETY ECLIPSE 18X1.5 (NEEDLE) ×2 IMPLANT
NEEDLE HYPO 18GX1.5 SHARP (NEEDLE) ×4
NS IRRIG 1000ML POUR BTL (IV SOLUTION) ×2 IMPLANT
PACK TOTAL JOINT (CUSTOM PROCEDURE TRAY) ×2 IMPLANT
PADDING CAST COTTON 6X4 STRL (CAST SUPPLIES) ×5 IMPLANT
POSITIONER SURGICAL ARM (MISCELLANEOUS) ×2 IMPLANT
SET HNDPC FAN SPRY TIP SCT (DISPOSABLE) ×1 IMPLANT
SPONGE GAUZE 4X4 12PLY (GAUZE/BANDAGES/DRESSINGS) ×2 IMPLANT
STRIP CLOSURE SKIN 1/2X4 (GAUZE/BANDAGES/DRESSINGS) ×3 IMPLANT
SUCTION FRAZIER 12FR DISP (SUCTIONS) ×2 IMPLANT
SUT MNCRL AB 4-0 PS2 18 (SUTURE) ×2 IMPLANT
SUT VIC AB 2-0 CT1 27 (SUTURE) ×6
SUT VIC AB 2-0 CT1 TAPERPNT 27 (SUTURE) ×3 IMPLANT
SUT VLOC 180 0 24IN GS25 (SUTURE) ×2 IMPLANT
SYR 20CC LL (SYRINGE) ×2 IMPLANT
SYR 50ML LL SCALE MARK (SYRINGE) ×2 IMPLANT
TOWEL OR 17X26 10 PK STRL BLUE (TOWEL DISPOSABLE) ×4 IMPLANT
TRAY FOLEY CATH 14FRSI W/METER (CATHETERS) ×2 IMPLANT
WATER STERILE IRR 1500ML POUR (IV SOLUTION) ×2 IMPLANT
WRAP KNEE MAXI GEL POST OP (GAUZE/BANDAGES/DRESSINGS) ×2 IMPLANT

## 2013-07-06 NOTE — Progress Notes (Signed)
UR COMPLETED  

## 2013-07-06 NOTE — Evaluation (Signed)
Physical Therapy Evaluation Patient Details Name: Julian Fowler MRN: [id number] DOB: Apr 23, 1935 Today's Date: 07/06/2013 Time: 4098-1191 PT Time Calculation (min): 22 min  PT Assessment / Plan / Recommendation History of Present Illness  s/p L TKA  Clinical Impression  Pt will benefit from PT in the acute setting to address deficits below; pt is planning for SNF rehab at [facility] for higher level rehab to facilitate return to I lifestyle    PT Assessment  Patient needs continued PT services    Follow Up Recommendations  SNF    Does the patient have the potential to tolerate intense rehabilitation      Barriers to Discharge        Equipment Recommendations       Recommendations for Other Services     Frequency 7X/week    Precautions / Restrictions Precautions Precautions: Fall Required Braces or Orthoses: Knee Immobilizer - Left Knee Immobilizer - Left:  (not used today d/t I SLR) Restrictions Other Position/Activity Restrictions: WBAT   Pertinent Vitals/Pain No c/o pain      Mobility  Bed Mobility Bed Mobility: Supine to Sit Supine to Sit: 4: Min assist Details for Bed Mobility Assistance: min wtih LLE and verbal cues for use of UEs Transfers Transfers: Sit to Stand;Stand to Sit Sit to Stand: 4: Min guard Stand to Sit: 4: Min guard Details for Transfer Assistance: verbal cues for hand placement and LLE position Ambulation/Gait Ambulation Distance (Feet): 120 Feet Assistive device: Rolling walker Ambulation/Gait Assistance Details: cues for RW safety, sequence, upward gaze; Pt moves very quickly and needs cues to slow down for safety Gait Pattern: Step-to pattern;Step-through pattern    Exercises Total Joint Exercises Ankle Circles/Pumps: AROM;Both;10 reps Quad Sets: 10 reps;Strengthening;Both Heel Slides: Left;10 reps;AAROM   PT Diagnosis: Difficulty walking  PT Problem List: Decreased activity tolerance;Decreased range of motion;Decreased  strength;Decreased balance;Decreased mobility;Decreased knowledge of use of DME PT Treatment Interventions: Functional mobility training;Stair training;Gait training;DME instruction;Therapeutic activities;Therapeutic exercise;Patient/family education     PT Goals(Current goals can be found in the care plan section) Acute Rehab PT Goals Patient Stated Goal: return to I, swimming, mowing lawns PT Goal Formulation: With patient Time For Goal Achievement: 07/10/13 Potential to Achieve Goals: Good  Visit Information  Last PT Received On: 07/06/13 Assistance Needed: +1 History of Present Illness: s/p L TKA       Prior Functioning  Home Living Family/patient expects to be discharged to:: Skilled nursing facility Living Arrangements: Spouse/significant other Additional Comments: [facility] Prior Function Level of Independence: Independent Communication Communication: No difficulties    Cognition  Cognition Arousal/Alertness: Awake/alert Behavior During Therapy: WFL for tasks assessed/performed Overall Cognitive Status: Within Functional Limits for tasks assessed    Extremity/Trunk Assessment Upper Extremity Assessment Upper Extremity Assessment: Overall WFL for tasks assessed Lower Extremity Assessment Lower Extremity Assessment: LLE deficits/detail LLE Deficits / Details: ankle WFL; able to do SLR I'ly today   Balance    End of Session PT - End of Session Equipment Utilized During Treatment: Gait belt Activity Tolerance: Patient tolerated treatment well Patient left: in chair;with call bell/phone within reach;with family/visitor present Nurse Communication: Mobility status  GP     [facility] 07/06/2013, 5:53 PM

## 2013-07-06 NOTE — Transfer of Care (Signed)
Immediate Anesthesia Transfer of Care Note  Patient: Julian Fowler  Procedure(s) Performed: Procedure(s) (LRB): LEFT TOTAL KNEE ARTHROPLASTY (Left)  Patient Location: PACU  Anesthesia Type: General  Level of Consciousness: sedated, patient cooperative and responds to stimulation  Airway & Oxygen Therapy: Patient Spontanous Breathing and Patient connected to face mask oxgen  Post-op Assessment: Report given to PACU RN and Post -op Vital signs reviewed and stable  Post vital signs: Reviewed and stable  Complications: No apparent anesthesia complications

## 2013-07-06 NOTE — Interval H&P Note (Signed)
History and Physical Interval Note:  07/06/2013 6:55 AM  Julian Fowler  has presented today for surgery, with the diagnosis of Osteoarthritis of the Left Knee  The various methods of treatment have been discussed with the patient and family. After consideration of risks, benefits and other options for treatment, the patient has consented to  Procedure(s): LEFT TOTAL KNEE ARTHROPLASTY (Left) as a surgical intervention .  The patient's history has been reviewed, patient examined, no change in status, stable for surgery.  I have reviewed the patient's chart and labs.  Questions were answered to the patient's satisfaction.     Loanne Drilling

## 2013-07-06 NOTE — Progress Notes (Signed)
Clinical Social Work Department BRIEF PSYCHOSOCIAL ASSESSMENT 07/06/2013  Patient:  Julian Fowler, Julian Fowler     Account Number:  [id number]     Admit date:  07/06/2013  Clinical Social Worker:  Candie Chroman  Date/Time:  07/06/2013 01:33 PM  Referred by:  Physician  Date Referred:  07/06/2013 Referred for  SNF Placement   Other Referral:   Interview type:  Patient Other interview type:    PSYCHOSOCIAL DATA Living Status:  ALONE Admitted from facility:   Level of care:   Primary support name:  Malachi Paradise Primary support relationship to patient:  FRIEND Degree of support available:   supportive    CURRENT CONCERNS Current Concerns  Post-Acute Placement   Other Concerns:    SOCIAL WORK ASSESSMENT / PLAN Pt is a 14 yr.old gentleman living at home prior to hospitalization. CSW met with pt and Friend to assist with d/c planning. ST Rehab is needed following hospital d/c. Pt would like to go to [facility]. SNF contacted and a decision is pending. CSW has initiated a SNF search to provide all options in case [facility] has no openings.   Assessment/plan status:  Psychosocial Support/Ongoing Assessment of Needs Other assessment/ plan:   Information/referral to community resources:   SNF list with bed offers to be provided.    PATIENT'S/FAMILY'S RESPONSE TO PLAN OF CARE: Pt is hoping [facility] will have an opening for him at d/c.   Cori Razor LCSW [phone]

## 2013-07-06 NOTE — Anesthesia Postprocedure Evaluation (Signed)
  Anesthesia Post-op Note  Patient: Julian Fowler  Procedure(s) Performed: Procedure(s) (LRB): LEFT TOTAL KNEE ARTHROPLASTY (Left)  Patient Location: PACU  Anesthesia Type: General  Level of Consciousness: awake and alert   Airway and Oxygen Therapy: Patient Spontanous Breathing  Post-op Pain: mild  Post-op Assessment: Post-op Vital signs reviewed, Patient's Cardiovascular Status Stable, Respiratory Function Stable, Patent Airway and No signs of Nausea or vomiting  Last Vitals:  Filed Vitals:   07/06/13 1025  BP: 141/67  Pulse: 55  Temp: 36.3 C  Resp: 14    Post-op Vital Signs: stable   Complications: No apparent anesthesia complications

## 2013-07-06 NOTE — Anesthesia Preprocedure Evaluation (Signed)
Anesthesia Evaluation  Patient identified by MRN, date of birth, ID band Patient awake    Reviewed: Allergy & Precautions, H&P , NPO status , Patient's Chart, lab work & pertinent test results, reviewed documented beta blocker date and time   Airway Mallampati: II TM Distance: >3 FB Neck ROM: full    Dental no notable dental hx. (+) Missing and Dental Advisory Given Missing all lower front and left half of upper front.  Large chip on right upper front.:   Pulmonary asthma , Continuous Positive Airway Pressure Ventilation , COPD breath sounds clear to auscultation  Pulmonary exam normal       Cardiovascular Exercise Tolerance: Good hypertension, Pt. on medications + dysrhythmias Supra Ventricular Tachycardia Rhythm:regular Rate:Normal     Neuro/Psych Anxiety Depression Back surgery 6 weeks ago negative neurological ROS  negative psych ROS   GI/Hepatic negative GI ROS, Neg liver ROS, GERD-  Medicated and Controlled,  Endo/Other  negative endocrine ROS  Renal/GU negative Renal ROS  negative genitourinary   Musculoskeletal   Abdominal   Peds  Hematology negative hematology ROS (+)   Anesthesia Other Findings K 2.8 today  Reproductive/Obstetrics negative OB ROS                           Anesthesia Physical Anesthesia Plan  ASA: III  Anesthesia Plan: General   Post-op Pain Management:    Induction: Intravenous  Airway Management Planned: Oral ETT  Additional Equipment:   Intra-op Plan:   Post-operative Plan: Extubation in OR  Informed Consent: I have reviewed the patients History and Physical, chart, labs and discussed the procedure including the risks, benefits and alternatives for the proposed anesthesia with the patient or authorized representative who has indicated his/her understanding and acceptance.   Dental Advisory Given  Plan Discussed with: CRNA and Surgeon  Anesthesia  Plan Comments:         Anesthesia Quick Evaluation

## 2013-07-06 NOTE — Op Note (Signed)
Pre-operative diagnosis- Osteoarthritis  Left knee(s)  Post-operative diagnosis- Osteoarthritis Left knee(s)  Procedure-  Left  Total Knee Arthroplasty  Surgeon- Gus Rankin. Traye Bates, MD  Assistant- Dimitri Ped, PA-C   Anesthesia-  General EBL-* No blood loss amount entered *  Drains Hemovac  Tourniquet time-  Total Tourniquet Time Documented: Thigh (Left) - 40 minutes Total: Thigh (Left) - 40 minutes    Complications- None  Condition-PACU - hemodynamically stable.   Brief Clinical Note   Julian Fowler is a 78 y.o. year old male with end stage OA of his left knee with progressively worsening pain and dysfunction. He has constant pain, with activity and at rest and significant functional deficits with difficulties even with ADLs. He has had extensive non-op management including analgesics, injections of cortisone, and home exercise program, but remains in significant pain with significant dysfunction. Radiographs show bone on bone arthritis medial and patellofemoral with significant varus and significant bony erosion of the medial compartment. He presents now for left Total Knee Arthroplasty.     Procedure in detail---   The patient is brought into the operating room and positioned supine on the operating table. After successful administration of  General,   a tourniquet is placed high on the  Left thigh(s) and the lower extremity is prepped and draped in the usual sterile fashion. Time out is performed by the operating team and then the  Left lower extremity is wrapped in Esmarch, knee flexed and the tourniquet inflated to 300 mmHg.       A midline incision is made with a ten blade through the subcutaneous tissue to the level of the extensor mechanism. A fresh blade is used to make a medial parapatellar arthrotomy. Soft tissue over the proximal medial tibia is subperiosteally elevated to the joint line with a knife and into the semimembranosus bursa with a Cobb elevator. Soft tissue  over the proximal lateral tibia is elevated with attention being paid to avoiding the patellar tendon on the tibial tubercle. The patella is everted, knee flexed 90 degrees and the ACL and PCL are removed. Findings are bone on bone medial and patellofemoral with massive global osteophytes and large subchondral cysts.       The drill is used to create a starting hole in the distal femur and the canal is thoroughly irrigated with sterile saline to remove the fatty contents. The 5 degree Left  valgus alignment guide is placed into the femoral canal and the distal femoral cutting block is pinned to remove 10 mm off the distal femur. Resection is made with an oscillating saw.      The tibia is subluxed forward and the menisci are removed. The extramedullary alignment guide is placed referencing proximally at the medial aspect of the tibial tubercle and distally along the second metatarsal axis and tibial crest. The block is pinned to remove 2mm off the more deficient medial  side. Resection is made with an oscillating saw. Size 4is the most appropriate size for the tibia and the proximal tibia is prepared with the modular drill and keel punch for that size.      The femoral sizing guide is placed and size 4 is most appropriate. Rotation is marked off the epicondylar axis and confirmed by creating a rectangular flexion gap at 90 degrees. The size 4 cutting block is pinned in this rotation and the anterior, posterior and chamfer cuts are made with the oscillating saw. The intercondylar block is then placed and that cut is  made.      Trial size 4 tibial component, trial size 4 posterior stabilized femur and a 10  mm posterior stabilized rotating platform insert trial is placed. Full extension is achieved with excellent varus/valgus and anterior/posterior balance throughout full range of motion. The patella is everted and thickness measured to be 25  mm. Free hand resection is taken to 15 mm, a 38 template is placed, lug  holes are drilled, trial patella is placed, and it tracks normally. Osteophytes are removed off the posterior femur with the trial in place. All trials are removed and the cut bone surfaces prepared with pulsatile lavage. Cement is mixed and once ready for implantation, the size 4 tibial implant, size  4 posterior stabilized femoral component, and the size 38 patella are cemented in place and the patella is held with the clamp. The trial insert is placed and the knee held in full extension. The Exparel (20 ml mixed with 30 ml saline) and .25% Bupivicaine, are injected into the extensor mechanism, posterior capsule, medial and lateral gutters and subcutaneous tissues.  All extruded cement is removed and once the cement is hard the permanent 10 mm posterior stabilized rotating platform insert is placed into the tibial tray.      The wound is copiously irrigated with saline solution and the extensor mechanism closed over a hemovac drain with #1 PDS suture. The tourniquet is released for a total tourniquet time of 40  minutes. Flexion against gravity is 140 degrees and the patella tracks normally. Subcutaneous tissue is closed with 2.0 vicryl and subcuticular with running 4.0 Monocryl. The incision is cleaned and dried and steri-strips and a bulky sterile dressing are applied. The limb is placed into a knee immobilizer and the patient is awakened and transported to recovery in stable condition.      Please note that a surgical assistant was a medical necessity for this procedure in order to perform it in a safe and expeditious manner. Surgical assistant was necessary to retract the ligaments and vital neurovascular structures to prevent injury to them and also necessary for proper positioning of the limb to allow for anatomic placement of the prosthesis.   Gus Rankin Irvin Lizama, MD    07/06/2013, 8:21 AM

## 2013-07-06 NOTE — Progress Notes (Addendum)
Clinical Social Work Department CLINICAL SOCIAL WORK PLACEMENT NOTE 07/06/2013  Patient:  Julian Fowler, Julian Fowler  Account Number:  [id number] Admit date:  07/06/2013  Clinical Social Worker:  Cori Razor, LCSW  Date/time:  07/06/2013 01:42 PM  Clinical Social Work is seeking post-discharge placement for this patient at the following level of care:   SKILLED NURSING   (*CSW will update this form in Epic as items are completed)   07/06/2013  Patient/family provided with [facility] Department of Clinical Social Work's list of facilities offering this level of care within the geographic area requested by the patient (or if unable, by the patient's family).  07/06/2013  Patient/family informed of their freedom to choose among providers that offer the needed level of care, that participate in Medicare, Medicaid or managed care program needed by the patient, have an available bed and are willing to accept the patient.  07/06/2013  Patient/family informed of [facility]' ownership interest in [facility], as well as of the fact that they are under no obligation to receive care at this facility.  PASARR submitted to EDS on 07/06/2013 PASARR number received from EDS on   FL2 transmitted to all facilities in geographic area requested by pt/family on  07/06/2013 FL2 transmitted to all facilities within larger geographic area on   Patient informed that his/her managed care company has contracts with or will negotiate with  certain facilities, including the following:     Patient/family informed of bed offers received:  07/07/13 Patient chooses bed at  Physician recommends and patient chooses bed at    Patient to be transferred to [facility] on  07/09/13 Patient to be transferred to facility by [facility]  The following physician request were entered in Epic:   Additional Comments:  Cori Razor LCSW [phone]

## 2013-07-07 ENCOUNTER — Encounter (HOSPITAL_COMMUNITY): Payer: Self-pay | Admitting: Orthopedic Surgery

## 2013-07-07 DIAGNOSIS — E876 Hypokalemia: Secondary | ICD-10-CM

## 2013-07-07 DIAGNOSIS — E871 Hypo-osmolality and hyponatremia: Secondary | ICD-10-CM

## 2013-07-07 LAB — CBC
HCT: 32.5 % — ABNORMAL LOW (ref 39.0–52.0)
Hemoglobin: 11.6 g/dL — ABNORMAL LOW (ref 13.0–17.0)
MCH: 29.7 pg (ref 26.0–34.0)
MCHC: 35.7 g/dL (ref 30.0–36.0)
Platelets: 318 10*3/uL (ref 150–400)
RBC: 3.9 MIL/uL — ABNORMAL LOW (ref 4.22–5.81)
WBC: 11.6 10*3/uL — ABNORMAL HIGH (ref 4.0–10.5)

## 2013-07-07 LAB — BASIC METABOLIC PANEL
BUN: 8 mg/dL (ref 6–23)
CO2: 24 mEq/L (ref 19–32)
Calcium: 8.8 mg/dL (ref 8.4–10.5)
Glucose, Bld: 177 mg/dL — ABNORMAL HIGH (ref 70–99)
Potassium: 2.7 mEq/L — CL (ref 3.5–5.1)
Sodium: 122 mEq/L — ABNORMAL LOW (ref 135–145)

## 2013-07-07 MED ORDER — POTASSIUM CHLORIDE CRYS ER 20 MEQ PO TBCR
40.0000 meq | EXTENDED_RELEASE_TABLET | ORAL | Status: AC
  Administered 2013-07-07 (×3): 40 meq via ORAL
  Filled 2013-07-07 (×3): qty 2

## 2013-07-07 MED ORDER — POTASSIUM CHLORIDE CRYS ER 20 MEQ PO TBCR
20.0000 meq | EXTENDED_RELEASE_TABLET | Freq: Every day | ORAL | Status: DC
  Administered 2013-07-09: 10:00:00 20 meq via ORAL
  Filled 2013-07-07 (×2): qty 1

## 2013-07-07 NOTE — Progress Notes (Signed)
OT Cancellation Note  Patient Details Name: Julian Fowler MRN: [id number] DOB: 10-23-1934   Cancelled Treatment:    Reason Eval/Treat Not Completed: OT screened, no needs identified, will sign off. Defer to SNF.  Lennox Laity [phone] 07/07/2013, 12:26 PM

## 2013-07-07 NOTE — Progress Notes (Signed)
Physical Therapy Treatment Patient Details Name: Julian Fowler MRN: [id number] DOB: 10/03/1935 Today's Date: 07/07/2013 Time: 9147-8295 PT Time Calculation (min): 32 min  PT Assessment / Plan / Recommendation  History of Present Illness s/p L TKA   PT Comments   POD # 1 am session.  Instructed pt on KI use for amb.  Assisted OOB to amb in hallway then performed TKR TE's.  Pt plans to D/C to [facility] for ST Rehab.   Follow Up Recommendations  SNF     Does the patient have the potential to tolerate intense rehabilitation     Barriers to Discharge        Equipment Recommendations       Recommendations for Other Services    Frequency 7X/week   Progress towards PT Goals Progress towards PT goals: Progressing toward goals  Plan      Precautions / Restrictions Precautions Precautions: Fall Precaution Comments: Instructed pt on KI use for amb Required Braces or Orthoses: Knee Immobilizer - Left Knee Immobilizer - Left: Discontinue once straight leg raise with < 10 degree lag Restrictions Weight Bearing Restrictions: No Other Position/Activity Restrictions: WBAT    Pertinent Vitals/Pain C/o 5/10 ICE applied    Mobility  Bed Mobility Bed Mobility: Supine to Sit Supine to Sit: 4: Min guard Details for Bed Mobility Assistance: minguard wtih LLE and verbal cues for use of UEs Transfers Transfers: Sit to Stand;Stand to Sit Sit to Stand: 4: Min guard;5: Supervision;From bed Stand to Sit: 4: Min guard;5: Supervision;To chair/3-in-1 Details for Transfer Assistance: 25% verbal cues for hand placement and LLE position Ambulation/Gait Ambulation/Gait Assistance: 4: Min guard Ambulation Distance (Feet): 142 Feet Assistive device: Rolling walker Ambulation/Gait Assistance Details: 50% VC's to decrease gait speed and increase WB thru L LE to eqaul stance time.  50% VC's on proper walker to self distance and safety with turns using RW throughout turn completion. Gait Pattern:  Step-to pattern;Step-through pattern    Exercises   Total Knee Replacement TE's 10 reps B LE ankle pumps 10 reps knee presses 10 reps heel slides  10 reps SAQ's 10 reps SLR's 10 reps ABD Followed by ICE    PT Goals (current goals can now be found in the care plan section)    Visit Information  Last PT Received On: 07/07/13 Assistance Needed: +1 History of Present Illness: s/p L TKA    Subjective Data      Cognition       Balance     End of Session PT - End of Session Equipment Utilized During Treatment: Gait belt;Left knee immobilizer Activity Tolerance: Patient tolerated treatment well Patient left: in chair;with call bell/phone within reach;with family/visitor present   Felecia Shelling  PTA [facility]  Acute  Rehab Pager      [phone]

## 2013-07-07 NOTE — Progress Notes (Signed)
Physical Therapy Treatment Patient Details Name: Julian Fowler MRN: [id number] DOB: 17-Oct-1934 Today's Date: 07/07/2013 Time: 7829-5621 PT Time Calculation (min): 23 min  PT Assessment / Plan / Recommendation  History of Present Illness s/p L TKA   PT Comments   POD # 1 pm session.  Amb pt in hallway second time increased distance then assisted to BR to void.  Assisted back to bed for CPM.   Follow Up Recommendations  SNF     Does the patient have the potential to tolerate intense rehabilitation     Barriers to Discharge        Equipment Recommendations       Recommendations for Other Services    Frequency 7X/week   Progress towards PT Goals Progress towards PT goals: Progressing toward goals  Plan      Precautions / Restrictions Precautions Precautions: Fall Precaution Comments: Instructed pt on KI use for amb Required Braces or Orthoses: Knee Immobilizer - Left Knee Immobilizer - Left: Discontinue once straight leg raise with < 10 degree lag Restrictions Weight Bearing Restrictions: No Other Position/Activity Restrictions: WBAT   Pertinent Vitals/Pain C/o 3/10 pain "not bad'    Mobility  Bed Mobility Bed Mobility: Sit to Supine Supine to Sit: 4: Min guard Sit to Supine: 4: Min assist Details for Bed Mobility Assistance: min assist back to bed  Transfers Transfers: Sit to Stand;Stand to Sit Sit to Stand: 4: Min guard;5: Supervision;From chair/3-in-1 Stand to Sit: 4: Min guard;5: Supervision;To bed Details for Transfer Assistance: 25% verbal cues for hand placement and LLE position Ambulation/Gait Ambulation/Gait Assistance: 4: Min guard Ambulation Distance (Feet): 255 Feet Assistive device: Rolling walker Ambulation/Gait Assistance Details: 25% VC's on safety with turns using RW correclty Gait Pattern: Step-to pattern;Step-through pattern     PT Goals (current goals can now be found in the care plan section)    Visit Information  Last PT Received On:  07/07/13 Assistance Needed: +1 History of Present Illness: s/p L TKA    Subjective Data      Cognition       Balance     End of Session PT - End of Session Equipment Utilized During Treatment: Gait belt;Left knee immobilizer Activity Tolerance: Patient tolerated treatment well Patient left: in bed;with call bell/phone within reach;with family/visitor present   Felecia Shelling  PTA [facility]  Acute  Rehab Pager      [phone]

## 2013-07-07 NOTE — Progress Notes (Signed)
Subjective: 1 Day Post-Op Procedure(s) (LRB): LEFT TOTAL KNEE ARTHROPLASTY (Left) Patient reports pain as mild.   Patient seen in rounds with Dr. Lequita Halt.  Family in room. Patient is well, and has had no acute complaints or problems We will start therapy today.  Plan is to go to [facility] after hospital stay.  Objective: Vital signs in last 24 hours: Temp:  [96.3 F (35.7 C)-98.6 F (37 C)] 97.9 F (36.6 C) (09/23 1610) Pulse Rate:  [52-67] 62 (09/23 0614) Resp:  [13-22] 16 (09/23 0348) BP: (123-169)/(56-80) 168/80 mmHg (09/23 0614) SpO2:  [95 %-100 %] 98 % (09/23 0614) FiO2 (%):  [2 %] 2 % (09/22 1025) Weight:  [92.987 kg (205 lb)] 92.987 kg (205 lb) (09/22 1025)  Intake/Output from previous day:  Intake/Output Summary (Last 24 hours) at 07/07/13 0758 Last data filed at 07/07/13 0600  Gross per 24 hour  Intake   2615 ml  Output   4490 ml  Net  -1875 ml    Intake/Output this shift:    Labs:  Recent Labs  07/07/13 0420  HGB 11.6*    Recent Labs  07/07/13 0420  WBC 11.6*  RBC 3.90*  HCT 32.5*  PLT 318    Recent Labs  07/06/13 0545 07/07/13 0420  NA 127* 122*  K 2.8* 2.7*  CL 91* 86*  CO2 25 24  BUN 12 8  CREATININE 0.65 0.54  GLUCOSE 99 177*  CALCIUM 8.8 8.8   No results found for this basename: LABPT, INR,  in the last 72 hours  EXAM General - Patient is Alert, Appropriate and Oriented Extremity - Neurovascular intact Sensation intact distally Dorsiflexion/Plantar flexion intact Dressing - dressing C/D/I Motor Function - intact, moving foot and toes well on exam.  Hemovac pulled without difficulty.  Past Medical History  Diagnosis Date  . Anxiety     codependent relationship with irresponsible son  . Depression   . GERD (gastroesophageal reflux disease)   . Hyperlipidemia   . Hypertension December 29, 2009    renal artery Doppler , normal  12/2009  . Abdominal aorta injury     Normal size, ultrasound, March 17,2011  . Asbestos  exposure     Hx of asbestos related pleural plaques  . Overactive bladder   . ED (erectile dysfunction)   . Arthritis   . Hearing loss in left ear   . Baker's cyst     Left leg  . SVT (supraventricular tachycardia)     AVNRT, AV nodal reentry tachycardia  . Allergic rhinitis   . Syncope December 2010    09/2009  felt to be vasovagal or micturition syncope  . Chest pain     nuclear, 10/2007, normal  . Ejection fraction     60%, echo, 2008, mild RV dysfunction  . Easy bruisability   . H/O sleep apnea     resolved with weight loss  . BPH (benign prostatic hyperplasia)   . OSA (obstructive sleep apnea)     moderate -NPSG 03/02/08 AHI 23 cpap 8..06/30/13 PT DENIES SLEEP APNEA OR CPAP MACHINE    Assessment/Plan: 1 Day Post-Op Procedure(s) (LRB): LEFT TOTAL KNEE ARTHROPLASTY (Left) Principal Problem:   OA (osteoarthritis) of knee Active Problems:   Hyponatremia   Hypokalemia  Hold Chlorthalidone  Estimated body mass index is 30.26 kg/(m^2) as calculated from the following:   Height as of this encounter: 5\' 9"  (1.753 m).   Weight as of this encounter: 92.987 kg (205 lb). Advance diet  Up with therapy Discharge to SNF  DVT Prophylaxis - Xarelto Weight-Bearing as tolerated to left leg No vaccines. D/C O2 and Pulse OX and try on Room [address]  Patrica Duel 07/07/2013, 7:58 AM

## 2013-07-07 NOTE — Progress Notes (Signed)
CRITICAL VALUE ALERT  Critical value received:  K 2.7  Date of notification:  07/07/13  Time of notification:  0456  Critical value read back:yes  Nurse who received alert:  Percell Boston RN  MD notified (1st page):  Alphonsa Overall PA  Time of first page:  0520  MD notified (2nd page):  Time of second page:  Responding MD:  Alphonsa Overall PA  Time MD responded:  [phone]

## 2013-07-08 LAB — BASIC METABOLIC PANEL
BUN: 9 mg/dL (ref 6–23)
CO2: 25 mEq/L (ref 19–32)
Calcium: 8.8 mg/dL (ref 8.4–10.5)
GFR calc Af Amer: >90 mL/min (ref >90)
GFR calc non Af Amer: >90 mL/min (ref >90)
Glucose, Bld: 131 mg/dL — ABNORMAL HIGH (ref 70–99)
Potassium: 3.7 mEq/L (ref 3.5–5.1)

## 2013-07-08 LAB — CBC
HCT: 28.9 % — ABNORMAL LOW (ref 39.0–52.0)
Hemoglobin: 10.3 g/dL — ABNORMAL LOW (ref 13.0–17.0)
MCH: 29.8 pg (ref 26.0–34.0)
MCHC: 35.6 g/dL (ref 30.0–36.0)
Platelets: 286 10*3/uL (ref 150–400)
RDW: 13.6 % (ref 11.5–15.5)
WBC: 13.5 10*3/uL — ABNORMAL HIGH (ref 4.0–10.5)

## 2013-07-08 MED ORDER — BISACODYL 5 MG PO TBEC
5.0000 mg | DELAYED_RELEASE_TABLET | Freq: Every day | ORAL | Status: DC | PRN
  Administered 2013-07-08: 14:00:00 10 mg via ORAL
  Filled 2013-07-08 (×2): qty 2
  Filled 2013-07-08: qty 1

## 2013-07-08 MED ORDER — CYCLOBENZAPRINE HCL 10 MG PO TABS: 5.0000 mg | ORAL_TABLET | Freq: Three times a day (TID) | ORAL | Status: DC | PRN

## 2013-07-08 NOTE — Progress Notes (Signed)
Physical Therapy Treatment Patient Details Name: Julian Fowler MRN: [id number] DOB: 1935-03-25 Today's Date: 07/08/2013 Time: 4098-1191 PT Time Calculation (min): 24 min  PT Assessment / Plan / Recommendation  History of Present Illness s/p L TKA   PT Comments   POD # 2 am session.  Amb a great distance in hallway, negotiated stairs then performed TKR TE's.  Follow Up Recommendations  SNF     Does the patient have the potential to tolerate intense rehabilitation     Barriers to Discharge        Equipment Recommendations       Recommendations for Other Services    Frequency 7X/week   Progress towards PT Goals    Plan      Precautions / Restrictions Precautions Precautions: Fall Precaution Comments: D/C KI as pt was able to perform active SLR Knee Immobilizer - Left: Discontinue once straight leg raise with < 10 degree lag Restrictions Weight Bearing Restrictions: No Other Position/Activity Restrictions: WBAT    Pertinent Vitals/Pain C/o 3/10 during TE's ICE applied    Mobility  Bed Mobility Bed Mobility: Not assessed Details for Bed Mobility Assistance: Pt OOB in recliner Transfers Transfers: Sit to Stand;Stand to Sit Sit to Stand: 5: Supervision;From chair/3-in-1 Stand to Sit: 5: Supervision;To chair/3-in-1 Details for Transfer Assistance: increased time and one VC on safety with turns Ambulation/Gait Ambulation/Gait Assistance: 5: Supervision;4: Min guard Ambulation Distance (Feet): 400 Feet Assistive device: Rolling walker Ambulation/Gait Assistance Details: <25% VC's on safety as pt is impulsive/over achiever Gait Pattern: Step-to pattern;Step-through pattern Stairs: Yes Stairs Assistance: 4: Min assist Stairs Assistance Details (indicate cue type and reason): Pt requested to perform this activity Stair Management Technique: One rail Left;Forwards Number of Stairs: 4    Exercises   Total Knee Replacement TE's 10 reps B LE ankle pumps 10 reps knee  presses 10 reps heel slides  10 reps SAQ's 10 reps SLR's 10 reps ABD Followed by ICE   PT Goals (current goals can now be found in the care plan section)    Visit Information  Last PT Received On: 07/08/13 History of Present Illness: s/p L TKA    Subjective Data      Cognition       Balance     End of Session PT - End of Session Equipment Utilized During Treatment: Gait belt Activity Tolerance: Patient tolerated treatment well Patient left: in chair;with call bell/phone within reach;with family/visitor present CPM Left Knee CPM Left Knee: Off   Felecia Shelling  PTA [facility]  Acute  Rehab Pager      [phone]

## 2013-07-08 NOTE — Progress Notes (Signed)
Subjective: 2 Days Post-Op Procedure(s) (LRB): LEFT TOTAL KNEE ARTHROPLASTY (Left) Patient reports pain as mild.   Patient seen in rounds for Dr. Lequita Halt. His biggest complaint is the hiccups. Patient is well, but has had some minor complaints of hiccups Plan is to go Skilled nursing facility after hospital stay.  Objective: Vital signs in last 24 hours: Temp:  [98 F (36.7 C)-98.8 F (37.1 C)] 98.5 F (36.9 C) (09/24 1344) Pulse Rate:  [60-71] 71 (09/24 1344) Resp:  [16-20] 16 (09/24 1344) BP: (139-160)/(69-78) 160/78 mmHg (09/24 1344) SpO2:  [96 %] 96 % (09/24 1344)  Intake/Output from previous day:  Intake/Output Summary (Last 24 hours) at 07/08/13 1729 Last data filed at 07/07/13 1730  Gross per 24 hour  Intake    240 ml  Output    500 ml  Net   -260 ml    Intake/Output this shift:    Labs:  Recent Labs  07/07/13 0420 07/08/13 0410  HGB 11.6* 10.3*    Recent Labs  07/07/13 0420 07/08/13 0410  WBC 11.6* 13.5*  RBC 3.90* 3.46*  HCT 32.5* 28.9*  PLT 318 286    Recent Labs  07/07/13 0420 07/08/13 0410  NA 122* 124*  K 2.7* 3.7  CL 86* 91*  CO2 24 25  BUN 8 9  CREATININE 0.54 0.50  GLUCOSE 177* 131*  CALCIUM 8.8 8.8   No results found for this basename: LABPT, INR,  in the last 72 hours  EXAM General - Patient is Alert, Appropriate and Oriented Extremity - Neurovascular intact Sensation intact distally Dorsiflexion/Plantar flexion intact No cellulitis present Dressing/Incision - clean, dry, no drainage, healing Motor Function - intact, moving foot and toes well on exam.   Past Medical History  Diagnosis Date  . Anxiety     codependent relationship with irresponsible son  . Depression   . GERD (gastroesophageal reflux disease)   . Hyperlipidemia   . Hypertension December 29, 2009    renal artery Doppler , normal  12/2009  . Abdominal aorta injury     Normal size, ultrasound, March 17,2011  . Asbestos exposure     Hx of asbestos  related pleural plaques  . Overactive bladder   . ED (erectile dysfunction)   . Arthritis   . Hearing loss in left ear   . Baker's cyst     Left leg  . SVT (supraventricular tachycardia)     AVNRT, AV nodal reentry tachycardia  . Allergic rhinitis   . Syncope December 2010    09/2009  felt to be vasovagal or micturition syncope  . Chest pain     nuclear, 10/2007, normal  . Ejection fraction     60%, echo, 2008, mild RV dysfunction  . Easy bruisability   . H/O sleep apnea     resolved with weight loss  . BPH (benign prostatic hyperplasia)   . OSA (obstructive sleep apnea)     moderate -NPSG 03/02/08 AHI 23 cpap 8..06/30/13 PT DENIES SLEEP APNEA OR CPAP MACHINE    Assessment/Plan: 2 Days Post-Op Procedure(s) (LRB): LEFT TOTAL KNEE ARTHROPLASTY (Left) Principal Problem:   OA (osteoarthritis) of knee Active Problems:   Hyponatremia   Hypokalemia  Estimated body mass index is 30.26 kg/(m^2) as calculated from the following:   Height as of this encounter: 5\' 9"  (1.753 m).   Weight as of this encounter: 92.987 kg (205 lb). Up with therapy Plan for discharge tomorrow Discharge to SNF  DVT Prophylaxis - Xarelto Weight-Bearing as  tolerated to left leg  PERKINS, ALEXZANDREW 07/08/2013, 5:29 PM

## 2013-07-08 NOTE — Progress Notes (Signed)
CSW assisting with d/c planning. [facility] has a SNF bed for pt [facility] if stable for d/c. CSW will follow to assist with d/c planning to SNF.  Cori Razor LCSW [phone]

## 2013-07-09 ENCOUNTER — Inpatient Hospital Stay
Admission: RE | Admit: 2013-07-09 | Discharge: 2013-07-12 | Disposition: A | Discharge status: Other Acute Inpatient Hospital | Payer: Medicare Other | Source: Ambulatory Visit | Attending: Internal Medicine | Admitting: Internal Medicine

## 2013-07-09 DIAGNOSIS — Z79899 Other long term (current) drug therapy: Secondary | ICD-10-CM | POA: Diagnosis not present

## 2013-07-09 DIAGNOSIS — T07XXXA Unspecified multiple injuries, initial encounter: Secondary | ICD-10-CM | POA: Diagnosis not present

## 2013-07-09 DIAGNOSIS — M6281 Muscle weakness (generalized): Secondary | ICD-10-CM | POA: Diagnosis not present

## 2013-07-09 DIAGNOSIS — R609 Edema, unspecified: Secondary | ICD-10-CM | POA: Diagnosis not present

## 2013-07-09 DIAGNOSIS — Z87448 Personal history of other diseases of urinary system: Secondary | ICD-10-CM | POA: Diagnosis not present

## 2013-07-09 DIAGNOSIS — E876 Hypokalemia: Secondary | ICD-10-CM | POA: Diagnosis not present

## 2013-07-09 DIAGNOSIS — Z77090 Contact with and (suspected) exposure to asbestos: Secondary | ICD-10-CM | POA: Diagnosis not present

## 2013-07-09 DIAGNOSIS — Z471 Aftercare following joint replacement surgery: Secondary | ICD-10-CM | POA: Diagnosis not present

## 2013-07-09 DIAGNOSIS — Z8709 Personal history of other diseases of the respiratory system: Secondary | ICD-10-CM | POA: Diagnosis not present

## 2013-07-09 DIAGNOSIS — M129 Arthropathy, unspecified: Secondary | ICD-10-CM | POA: Diagnosis not present

## 2013-07-09 DIAGNOSIS — E871 Hypo-osmolality and hyponatremia: Secondary | ICD-10-CM | POA: Diagnosis not present

## 2013-07-09 DIAGNOSIS — Z87891 Personal history of nicotine dependence: Secondary | ICD-10-CM | POA: Diagnosis not present

## 2013-07-09 DIAGNOSIS — S8000XA Contusion of unspecified knee, initial encounter: Secondary | ICD-10-CM | POA: Diagnosis not present

## 2013-07-09 DIAGNOSIS — K219 Gastro-esophageal reflux disease without esophagitis: Secondary | ICD-10-CM | POA: Diagnosis not present

## 2013-07-09 DIAGNOSIS — E785 Hyperlipidemia, unspecified: Secondary | ICD-10-CM | POA: Diagnosis not present

## 2013-07-09 DIAGNOSIS — M25569 Pain in unspecified knee: Secondary | ICD-10-CM | POA: Diagnosis not present

## 2013-07-09 DIAGNOSIS — M79609 Pain in unspecified limb: Secondary | ICD-10-CM | POA: Diagnosis not present

## 2013-07-09 DIAGNOSIS — Z96659 Presence of unspecified artificial knee joint: Secondary | ICD-10-CM | POA: Diagnosis not present

## 2013-07-09 DIAGNOSIS — F411 Generalized anxiety disorder: Secondary | ICD-10-CM | POA: Diagnosis not present

## 2013-07-09 DIAGNOSIS — R41841 Cognitive communication deficit: Secondary | ICD-10-CM | POA: Diagnosis not present

## 2013-07-09 DIAGNOSIS — I714 Abdominal aortic aneurysm, without rupture: Secondary | ICD-10-CM | POA: Diagnosis not present

## 2013-07-09 DIAGNOSIS — R262 Difficulty in walking, not elsewhere classified: Secondary | ICD-10-CM | POA: Diagnosis not present

## 2013-07-09 DIAGNOSIS — Z8669 Personal history of other diseases of the nervous system and sense organs: Secondary | ICD-10-CM | POA: Diagnosis not present

## 2013-07-09 DIAGNOSIS — R209 Unspecified disturbances of skin sensation: Secondary | ICD-10-CM | POA: Diagnosis not present

## 2013-07-09 DIAGNOSIS — F329 Major depressive disorder, single episode, unspecified: Secondary | ICD-10-CM | POA: Diagnosis not present

## 2013-07-09 DIAGNOSIS — M2500 Hemarthrosis, unspecified joint: Secondary | ICD-10-CM | POA: Diagnosis not present

## 2013-07-09 DIAGNOSIS — IMO0002 Reserved for concepts with insufficient information to code with codable children: Secondary | ICD-10-CM | POA: Diagnosis not present

## 2013-07-09 DIAGNOSIS — M7989 Other specified soft tissue disorders: Secondary | ICD-10-CM | POA: Diagnosis not present

## 2013-07-09 DIAGNOSIS — R279 Unspecified lack of coordination: Secondary | ICD-10-CM | POA: Diagnosis not present

## 2013-07-09 DIAGNOSIS — I10 Essential (primary) hypertension: Secondary | ICD-10-CM | POA: Diagnosis not present

## 2013-07-09 LAB — CBC
Hemoglobin: 9.6 g/dL — ABNORMAL LOW (ref 13.0–17.0)
MCH: 28.6 pg (ref 26.0–34.0)
MCHC: 33.9 g/dL (ref 30.0–36.0)
Platelets: 288 10*3/uL (ref 150–400)
RDW: 13.8 % (ref 11.5–15.5)

## 2013-07-09 MED ORDER — METOCLOPRAMIDE HCL 5 MG PO TABS: 5.0000 mg | ORAL_TABLET | Freq: Three times a day (TID) | ORAL | Status: DC | PRN

## 2013-07-09 MED ORDER — CYCLOBENZAPRINE HCL 5 MG PO TABS: 5.0000 mg | ORAL_TABLET | Freq: Three times a day (TID) | ORAL | Status: DC | PRN

## 2013-07-09 MED ORDER — RIVAROXABAN 10 MG PO TABS: 10.0000 mg | ORAL_TABLET | Freq: Every day | ORAL | Status: DC

## 2013-07-09 MED ORDER — OXYCODONE HCL 5 MG PO TABS: 5.0000 mg | ORAL_TABLET | ORAL | Status: DC | PRN

## 2013-07-09 MED ORDER — BISACODYL 10 MG RE SUPP: 10.0000 mg | Freq: Every day | RECTAL | Status: DC | PRN

## 2013-07-09 MED ORDER — DSS 100 MG PO CAPS: 100.0000 mg | ORAL_CAPSULE | Freq: Two times a day (BID) | ORAL | Status: DC

## 2013-07-09 MED ORDER — BISACODYL 5 MG PO TBEC: 5.0000 mg | DELAYED_RELEASE_TABLET | Freq: Every day | ORAL | Status: DC | PRN

## 2013-07-09 MED ORDER — ALUM HYDROXIDE-MAG TRISILICATE 80-20 MG PO CHEW: 1.0000 | CHEWABLE_TABLET | Freq: Four times a day (QID) | ORAL | Status: DC | PRN

## 2013-07-09 MED ORDER — POLYETHYLENE GLYCOL 3350 17 G PO PACK: 17.0000 g | PACK | Freq: Every day | ORAL | Status: DC | PRN

## 2013-07-09 MED ORDER — ONDANSETRON HCL 4 MG PO TABS
4.0000 mg | ORAL_TABLET | Freq: Four times a day (QID) | ORAL | Status: DC | PRN
Start: 2013-07-09 — End: 2013-09-24

## 2013-07-09 MED ORDER — TRAMADOL HCL 50 MG PO TABS: 100.0000 mg | ORAL_TABLET | Freq: Four times a day (QID) | ORAL | Status: DC | PRN

## 2013-07-09 NOTE — Discharge Summary (Signed)
Physician Discharge Summary   Patient ID: Julian Fowler MRN: [id number] DOB/AGE: Mar 29, 1935 78 y.o.  Admit date: 07/06/2013 Discharge date: 07/09/2013  Primary Diagnosis:  Osteoarthritis Left knee  Admission Diagnoses:  Past Medical History  Diagnosis Date  . Anxiety     codependent relationship with irresponsible son  . Depression   . GERD (gastroesophageal reflux disease)   . Hyperlipidemia   . Hypertension December 29, 2009    renal artery Doppler , normal  12/2009  . Abdominal aorta injury     Normal size, ultrasound, March 17,2011  . Asbestos exposure     Hx of asbestos related pleural plaques  . Overactive bladder   . ED (erectile dysfunction)   . Arthritis   . Hearing loss in left ear   . Baker's cyst     Left leg  . SVT (supraventricular tachycardia)     AVNRT, AV nodal reentry tachycardia  . Allergic rhinitis   . Syncope December 2010    09/2009  felt to be vasovagal or micturition syncope  . Chest pain     nuclear, 10/2007, normal  . Ejection fraction     60%, echo, 2008, mild RV dysfunction  . Easy bruisability   . H/O sleep apnea     resolved with weight loss  . BPH (benign prostatic hyperplasia)   . OSA (obstructive sleep apnea)     moderate -NPSG 03/02/08 AHI 23 cpap 8..06/30/13 PT DENIES SLEEP APNEA OR CPAP MACHINE   Discharge Diagnoses:   Principal Problem:   OA (osteoarthritis) of knee Active Problems:   Hyponatremia   Hypokalemia  Estimated body mass index is 30.26 kg/(m^2) as calculated from the following:   Height as of this encounter: 5\' 9"  (1.753 m).   Weight as of this encounter: 92.987 kg (205 lb).  Procedure:  Procedure(s) (LRB): LEFT TOTAL KNEE ARTHROPLASTY (Left)   Consults: None  HPI: [name] is a 78 y.o. year old male with end stage OA of his left knee with progressively worsening pain and dysfunction. He has constant pain, with activity and at rest and significant functional deficits with difficulties even with ADLs. He has  had extensive non-op management including analgesics, injections of cortisone, and home exercise program, but remains in significant pain with significant dysfunction. Radiographs show bone on bone arthritis medial and patellofemoral with significant varus and significant bony erosion of the medial compartment. He presents now for left Total Knee Arthroplasty.   Laboratory Data: Admission on 07/06/2013  Component Date Value Range Status  . Sodium 07/06/2013 127* 135 - 145 mEq/L Final  . Potassium 07/06/2013 2.8* 3.5 - 5.1 mEq/L Final  . Chloride 07/06/2013 91* 96 - 112 mEq/L Final  . CO2 07/06/2013 25  19 - 32 mEq/L Final  . Glucose, Bld 07/06/2013 99  70 - 99 mg/dL Final  . BUN 40/98/1191 12  6 - 23 mg/dL Final  . Creatinine, Ser 07/06/2013 0.65  0.50 - 1.35 mg/dL Final  . Calcium 47/82/9562 8.8  8.4 - 10.5 mg/dL Final  . GFR calc non Af Amer 07/06/2013 >90  >90 mL/min Final  . GFR calc Af Amer 07/06/2013 >90  >90 mL/min Final   Comment: (NOTE)                          The eGFR has been calculated using the CKD EPI equation.  This calculation has not been validated in all clinical situations.                          eGFR's persistently <90 mL/min signify possible Chronic Kidney                          Disease.  . WBC 07/07/2013 11.6* 4.0 - 10.5 K/uL Final  . RBC 07/07/2013 3.90* 4.22 - 5.81 MIL/uL Final  . Hemoglobin 07/07/2013 11.6* 13.0 - 17.0 g/dL Final  . HCT 86/57/8469 32.5* 39.0 - 52.0 % Final  . MCV 07/07/2013 83.3  78.0 - 100.0 fL Final  . MCH 07/07/2013 29.7  26.0 - 34.0 pg Final  . MCHC 07/07/2013 35.7  30.0 - 36.0 g/dL Final  . RDW 62/95/2841 13.2  11.5 - 15.5 % Final  . Platelets 07/07/2013 318  150 - 400 K/uL Final  . Sodium 07/07/2013 122* 135 - 145 mEq/L Final  . Potassium 07/07/2013 2.7* 3.5 - 5.1 mEq/L Final   Comment: CRITICAL RESULT CALLED TO, READ BACK BY AND VERIFIED WITH:                          MOSTELLER,P/6E @0456  ON 07/07/13 BY  KARCZEWSKI,S.  . Chloride 07/07/2013 86* 96 - 112 mEq/L Final  . CO2 07/07/2013 24  19 - 32 mEq/L Final  . Glucose, Bld 07/07/2013 177* 70 - 99 mg/dL Final  . BUN 32/44/0102 8  6 - 23 mg/dL Final  . Creatinine, Ser 07/07/2013 0.54  0.50 - 1.35 mg/dL Final  . Calcium 72/53/6644 8.8  8.4 - 10.5 mg/dL Final  . GFR calc non Af Amer 07/07/2013 >90  >90 mL/min Final  . GFR calc Af Amer 07/07/2013 >90  >90 mL/min Final   Comment: (NOTE)                          The eGFR has been calculated using the CKD EPI equation.                          This calculation has not been validated in all clinical situations.                          eGFR's persistently <90 mL/min signify possible Chronic Kidney                          Disease.  . WBC 07/08/2013 13.5* 4.0 - 10.5 K/uL Final  . RBC 07/08/2013 3.46* 4.22 - 5.81 MIL/uL Final  . Hemoglobin 07/08/2013 10.3* 13.0 - 17.0 g/dL Final  . HCT 03/47/4259 28.9* 39.0 - 52.0 % Final  . MCV 07/08/2013 83.5  78.0 - 100.0 fL Final  . MCH 07/08/2013 29.8  26.0 - 34.0 pg Final  . MCHC 07/08/2013 35.6  30.0 - 36.0 g/dL Final  . RDW 56/38/7564 13.6  11.5 - 15.5 % Final  . Platelets 07/08/2013 286  150 - 400 K/uL Final  . Sodium 07/08/2013 124* 135 - 145 mEq/L Final  . Potassium 07/08/2013 3.7  3.5 - 5.1 mEq/L Final   Comment: RESULT REPEATED AND VERIFIED  DELTA CHECK NOTED  . Chloride 07/08/2013 91* 96 - 112 mEq/L Final  . CO2 07/08/2013 25  19 - 32 mEq/L Final  . Glucose, Bld 07/08/2013 131* 70 - 99 mg/dL Final  . BUN 96/29/5284 9  6 - 23 mg/dL Final  . Creatinine, Ser 07/08/2013 0.50  0.50 - 1.35 mg/dL Final  . Calcium 13/24/4010 8.8  8.4 - 10.5 mg/dL Final  . GFR calc non Af Amer 07/08/2013 >90  >90 mL/min Final  . GFR calc Af Amer 07/08/2013 >90  >90 mL/min Final   Comment: (NOTE)                          The eGFR has been calculated using the CKD EPI equation.                          This calculation has not been validated in all  clinical situations.                          eGFR's persistently <90 mL/min signify possible Chronic Kidney                          Disease.  . WBC 07/09/2013 10.9* 4.0 - 10.5 K/uL Final  . RBC 07/09/2013 3.36* 4.22 - 5.81 MIL/uL Final  . Hemoglobin 07/09/2013 9.6* 13.0 - 17.0 g/dL Final  . HCT 27/25/3664 28.3* 39.0 - 52.0 % Final  . MCV 07/09/2013 84.2  78.0 - 100.0 fL Final  . MCH 07/09/2013 28.6  26.0 - 34.0 pg Final  . MCHC 07/09/2013 33.9  30.0 - 36.0 g/dL Final  . RDW 40/34/7425 13.8  11.5 - 15.5 % Final  . Platelets 07/09/2013 288  150 - 400 K/uL Final  Hospital Outpatient Visit on 06/30/2013  Component Date Value Range Status  . aPTT 06/30/2013 32  24 - 37 seconds Final  . WBC 06/30/2013 7.3  4.0 - 10.5 K/uL Final  . RBC 06/30/2013 4.50  4.22 - 5.81 MIL/uL Final  . Hemoglobin 06/30/2013 13.6  13.0 - 17.0 g/dL Final  . HCT 95/63/8756 38.9* 39.0 - 52.0 % Final  . MCV 06/30/2013 86.4  78.0 - 100.0 fL Final  . MCH 06/30/2013 30.2  26.0 - 34.0 pg Final  . MCHC 06/30/2013 35.0  30.0 - 36.0 g/dL Final  . RDW 43/32/9518 14.0  11.5 - 15.5 % Final  . Platelets 06/30/2013 380  150 - 400 K/uL Final  . Sodium 06/30/2013 130* 135 - 145 mEq/L Final  . Potassium 06/30/2013 3.7  3.5 - 5.1 mEq/L Final  . Chloride 06/30/2013 92* 96 - 112 mEq/L Final  . CO2 06/30/2013 28  19 - 32 mEq/L Final  . Glucose, Bld 06/30/2013 98  70 - 99 mg/dL Final  . BUN 84/16/6063 13  6 - 23 mg/dL Final  . Creatinine, Ser 06/30/2013 0.69  0.50 - 1.35 mg/dL Final  . Calcium 01/60/1093 9.4  8.4 - 10.5 mg/dL Final  . Total Protein 06/30/2013 7.2  6.0 - 8.3 g/dL Final  . Albumin 23/55/7322 3.6  3.5 - 5.2 g/dL Final  . AST 02/54/2706 22  0 - 37 U/L Final  . ALT 06/30/2013 19  0 - 53 U/L Final  . Alkaline Phosphatase 06/30/2013 82  39 - 117 U/L Final  . Total Bilirubin 06/30/2013 0.2* 0.3 - 1.2 mg/dL Final  .  GFR calc non Af Amer 06/30/2013 90* >90 mL/min Final  . GFR calc Af Amer 06/30/2013 >90  >90 mL/min Final     Comment: (NOTE)                          The eGFR has been calculated using the CKD EPI equation.                          This calculation has not been validated in all clinical situations.                          eGFR's persistently <90 mL/min signify possible Chronic Kidney                          Disease.  Marland Kitchen Prothrombin Time 06/30/2013 13.7  11.6 - 15.2 seconds Final  . INR 06/30/2013 1.07  0.00 - 1.49 Final  . ABO/RH(D) 06/30/2013 A POS   Final  . Antibody Screen 06/30/2013 NEG   Final  . Sample Expiration 06/30/2013 07/09/2013   Final  . Color, Urine 06/30/2013 YELLOW  YELLOW Final  . APPearance 06/30/2013 CLEAR  CLEAR Final  . Specific Gravity, Urine 06/30/2013 1.012  1.005 - 1.030 Final  . pH 06/30/2013 7.0  5.0 - 8.0 Final  . Glucose, UA 06/30/2013 NEGATIVE  NEGATIVE mg/dL Final  . Hgb urine dipstick 06/30/2013 NEGATIVE  NEGATIVE Final  . Bilirubin Urine 06/30/2013 NEGATIVE  NEGATIVE Final  . Ketones, ur 06/30/2013 NEGATIVE  NEGATIVE mg/dL Final  . Protein, ur 81/19/1478 NEGATIVE  NEGATIVE mg/dL Final  . Urobilinogen, UA 06/30/2013 0.2  0.0 - 1.0 mg/dL Final  . Nitrite 29/56/2130 NEGATIVE  NEGATIVE Final  . Leukocytes, UA 06/30/2013 NEGATIVE  NEGATIVE Final   MICROSCOPIC NOT DONE ON URINES WITH NEGATIVE PROTEIN, BLOOD, LEUKOCYTES, NITRITE, OR GLUCOSE <1000 mg/dL.  Marland Kitchen MRSA, PCR 06/30/2013 NEGATIVE  NEGATIVE Final  . Staphylococcus aureus 06/30/2013 NEGATIVE  NEGATIVE Final   Comment:                                 The Xpert SA Assay (FDA                          approved for NASAL specimens                          in patients over 38 years of age),                          is one component of                          a comprehensive surveillance                          program.  Test performance has                          been validated by [employer]  [facility] for patients greater                          than or equal to 83 year old.                           It is not intended                          to diagnose infection nor to                          guide or monitor treatment.  . ABO/RH(D) 06/30/2013 A POS   Final     X-Rays:No results found.  EKG: Orders placed in visit on 06/26/13  . EKG 12-LEAD     Hospital Course: Julian Fowler is a 78 y.o. who was admitted to [facility]. They were brought to the operating room on 07/06/2013 and underwent Procedure(s): LEFT TOTAL KNEE ARTHROPLASTY.  Patient tolerated the procedure well and was later transferred to the recovery room and then to the orthopaedic floor for postoperative care.  They were given PO and IV analgesics for pain control following their surgery.  They were given 24 hours of postoperative antibiotics of  Anti-infectives   Start     Dose/Rate Route Frequency Ordered Stop   07/06/13 1300  ceFAZolin (ANCEF) IVPB 1 g/50 mL premix     1 g 100 mL/hr over 30 Minutes Intravenous Every 6 hours 07/06/13 1029 07/06/13 1902   07/06/13 0600  ceFAZolin (ANCEF) IVPB 2 g/50 mL premix     2 g 100 mL/hr over 30 Minutes Intravenous On call to O.R. 07/06/13 6295 07/06/13 2841     and started on DVT prophylaxis in the form of Xarelto.   PT and OT were ordered for total joint protocol.  Discharge planning consulted to help with postop disposition and equipment needs.  He wanted to look into a SNF.  Patient had a good night on the evening of surgery.  They started to get up OOB with therapy on day one and progressed well. Hemovac drain was pulled without difficulty.  Continued to work with therapy into day two.  Dressing was changed on day two and the incision was healing well.  By day three, the patient had progressed with therapy and meeting their goals.  Incision was healing well.  Patient was seen in rounds and was ready to go to [facility].   Discharge Medications: Prior to Admission medications   Medication Sig Start Date End Date Taking? Authorizing Provider   acetaminophen (TYLENOL) 325 MG tablet Take 325 mg by mouth every 6 (six) hours as needed for pain.    Yes Historical Provider, MD  amLODipine (NORVASC) 2.5 MG tablet Take 2.5 mg by mouth every morning.   Yes Historical Provider, MD  chlorthalidone (HYGROTON) 25 MG tablet Take 25 mg by mouth every morning.   Yes Historical Provider, MD  diltiazem (CARDIZEM CD) 120 MG 24 hr capsule Take 120 mg by mouth 2 (two) times daily.   Yes Historical Provider, MD  latanoprost (XALATAN) 0.005 % ophthalmic solution Place 1 drop into both eyes at bedtime.  09/01/12  Yes Historical Provider, MD  LORazepam (ATIVAN) 1 MG tablet Take 1 mg by mouth at bedtime.    Yes Historical Provider, MD  losartan (COZAAR) 50 MG  tablet Take 50 mg by mouth every morning.   Yes Historical Provider, MD  omeprazole (PRILOSEC) 20 MG capsule Take 20 mg by mouth daily. Take 1 tablet once daily thirty minutes before a meal   Yes Historical Provider, MD  potassium chloride SA (K-DUR,KLOR-CON) 20 MEQ tablet Take 20 mEq by mouth daily.   Yes Historical Provider, MD  pravastatin (PRAVACHOL) 20 MG tablet Take 20 mg by mouth at bedtime.     Yes Historical Provider, MD  quinapril (ACCUPRIL) 40 MG tablet Take 40 mg by mouth 2 (two) times daily.   Yes Historical Provider, MD  zolpidem (AMBIEN) 10 MG tablet Take 1 tablet (10 mg total) by mouth at bedtime as needed for sleep. 06/26/13 07/26/13 Yes Luis Abed, MD  alum hydroxide-mag trisilicate (GAVISCON) 80-20 MG CHEW chewable tablet Chew 1-2 tablets by mouth 4 (four) times daily as needed. 07/09/13   Eyoel Throgmorton Julien Girt, PA-C  bisacodyl (DULCOLAX) 10 MG suppository Place 1 suppository (10 mg total) rectally daily as needed. 07/09/13   Anaily Ashbaugh, PA-C  bisacodyl (DULCOLAX) 5 MG EC tablet Take 1-2 tablets (5-10 mg total) by mouth daily as needed. 07/09/13   Ardie Mclennan, PA-C  cyclobenzaprine (FLEXERIL) 5 MG tablet Take 1 tablet (5 mg total) by mouth 3 (three) times daily as needed for  muscle spasms. 07/09/13   Suvi Archuletta, PA-C  docusate sodium 100 MG CAPS Take 100 mg by mouth 2 (two) times daily. 07/09/13   Kischa Altice Julien Girt, PA-C  fexofenadine (ALLEGRA) 180 MG tablet Take 180 mg by mouth daily as needed. For allergies    Historical Provider, MD  metoCLOPramide (REGLAN) 5 MG tablet Take 1-2 tablets (5-10 mg total) by mouth every 8 (eight) hours as needed (if ondansetron (ZOFRAN) ineffective.). 07/09/13   Jaleea Alesi, PA-C  ondansetron (ZOFRAN) 4 MG tablet Take 1 tablet (4 mg total) by mouth every 6 (six) hours as needed for nausea. 07/09/13   Dashan Chizmar, PA-C  oxyCODONE (OXY IR/ROXICODONE) 5 MG immediate release tablet Take 1-2 tablets (5-10 mg total) by mouth every 3 (three) hours as needed. 07/09/13   Gustav Knueppel, PA-C  polyethylene glycol (MIRALAX / GLYCOLAX) packet Take 17 g by mouth daily as needed. 07/09/13   Maia Handa Julien Girt, PA-C  rivaroxaban (XARELTO) 10 MG TABS tablet Take 1 tablet (10 mg total) by mouth daily with breakfast. Take Xarelto for two and a half more weeks, then discontinue Xarelto. Once the patient has completed the blood thinner regimen, then take a Baby 81 mg Aspirin daily for four more weeks. 07/09/13   Triston Skare, PA-C  traMADol (ULTRAM) 50 MG tablet Take 2 tablets (100 mg total) by mouth every 6 (six) hours as needed for pain (mild pain). 07/09/13   Haroon Shatto Julien Girt, PA-C   Diet - Cardiac diet  Follow up - in 2 weeks  Activity - WBAT  Disposition - Skilled nursing facility  Condition Upon Discharge - Good  D/C Meds - See DC Summary  DVT Prophylaxis - Xarelto  Discharge Orders   Future Appointments Provider Department Dept Phone   09/24/2013 10:00 AM Waymon Budge, MD [facility] [phone]   Future Orders Complete By Expires   Call MD / Call 911  As directed    Comments:     If you experience chest pain or shortness of breath, CALL 911 and be transported to the hospital emergency room.   If you develope a fever above 101 F, pus (white drainage) or increased drainage or redness at  the wound, or calf pain, call your surgeon's office.   Change dressing  As directed    Comments:     Change dressing daily with sterile 4 x 4 inch gauze dressing and apply TED hose. Do not submerge the incision under water.   Constipation Prevention  As directed    Comments:     Drink plenty of fluids.  Prune juice may be helpful.  You may use a stool softener, such as Colace (over the counter) 100 mg twice a day.  Use MiraLax (over the counter) for constipation as needed.   Diet - low sodium heart healthy  As directed    Discharge instructions  As directed    Comments:     Pick up stool softner and laxative for home. Do not submerge incision under water. May shower. Continue to use ice for pain and swelling from surgery. Take Xarelto for two and a half more weeks, then discontinue Xarelto. Once the patient has completed the blood thinner regimen, then take a Baby 81 mg Aspirin daily for four more weeks.  When discharged from the skilled rehab facility, please have the facility set up the patient's Home Health Physical Therapy prior to being released.  Also provide the patient with their medications at time of release from the facility to include their pain medication, the muscle relaxants, and their blood thinner medication.  If the patient is still at the rehab facility at time of follow up appointment, please also assist the patient in arranging follow up appointment in our office and any transportation needs.   Do not put a pillow under the knee. Place it under the heel.  As directed    Do not sit on low chairs, stoools or toilet seats, as it may be difficult to get up from low surfaces  As directed    Driving restrictions  As directed    Comments:     No driving until released by the physician.   Increase activity slowly as tolerated  As directed    Lifting restrictions  As directed     Comments:     No lifting until released by the physician.   Patient may shower  As directed    Comments:     You may shower without a dressing once there is no drainage.  Do not wash over the wound.  If drainage remains, do not shower until drainage stops.   TED hose  As directed    Comments:     Use stockings (TED hose) for 3 weeks on both leg(s).  You may remove them at night for sleeping.   Weight bearing as tolerated  As directed        Medication List    STOP taking these medications       calcium carbonate 600 MG Tabs tablet  Commonly known as:  OS-CAL     CENTRUM SILVER PO     oxyCODONE-acetaminophen 5-325 MG per tablet  Commonly known as:  PERCOCET/ROXICET     vitamin A 8000 UNIT capsule      TAKE these medications       acetaminophen 325 MG tablet  Commonly known as:  TYLENOL  Take 325 mg by mouth every 6 (six) hours as needed for pain.     alum hydroxide-mag trisilicate 80-20 MG Chew chewable tablet  Commonly known as:  GAVISCON  Chew 1-2 tablets by mouth 4 (four) times daily as needed.     amLODipine 2.5 MG tablet  Commonly known as:  NORVASC  Take 2.5 mg by mouth every morning.     bisacodyl 5 MG EC tablet  Commonly known as:  DULCOLAX  Take 1-2 tablets (5-10 mg total) by mouth daily as needed.     bisacodyl 10 MG suppository  Commonly known as:  DULCOLAX  Place 1 suppository (10 mg total) rectally daily as needed.     chlorthalidone 25 MG tablet  Commonly known as:  HYGROTON  Take 25 mg by mouth every morning.     cyclobenzaprine 5 MG tablet  Commonly known as:  FLEXERIL  Take 1 tablet (5 mg total) by mouth 3 (three) times daily as needed for muscle spasms.     diltiazem 120 MG 24 hr capsule  Commonly known as:  CARDIZEM CD  Take 120 mg by mouth 2 (two) times daily.     DSS 100 MG Caps  Take 100 mg by mouth 2 (two) times daily.     fexofenadine 180 MG tablet  Commonly known as:  ALLEGRA  Take 180 mg by mouth daily as needed. For allergies      latanoprost 0.005 % ophthalmic solution  Commonly known as:  XALATAN  Place 1 drop into both eyes at bedtime.     LORazepam 1 MG tablet  Commonly known as:  ATIVAN  Take 1 mg by mouth at bedtime.     losartan 50 MG tablet  Commonly known as:  COZAAR  Take 50 mg by mouth every morning.     metoCLOPramide 5 MG tablet  Commonly known as:  REGLAN  Take 1-2 tablets (5-10 mg total) by mouth every 8 (eight) hours as needed (if ondansetron (ZOFRAN) ineffective.).     omeprazole 20 MG capsule  Commonly known as:  PRILOSEC  Take 20 mg by mouth daily. Take 1 tablet once daily thirty minutes before a meal     ondansetron 4 MG tablet  Commonly known as:  ZOFRAN  Take 1 tablet (4 mg total) by mouth every 6 (six) hours as needed for nausea.     oxyCODONE 5 MG immediate release tablet  Commonly known as:  Oxy IR/ROXICODONE  Take 1-2 tablets (5-10 mg total) by mouth every 3 (three) hours as needed.     polyethylene glycol packet  Commonly known as:  MIRALAX / GLYCOLAX  Take 17 g by mouth daily as needed.     potassium chloride SA 20 MEQ tablet  Commonly known as:  K-DUR,KLOR-CON  Take 20 mEq by mouth daily.     pravastatin 20 MG tablet  Commonly known as:  PRAVACHOL  Take 20 mg by mouth at bedtime.     quinapril 40 MG tablet  Commonly known as:  ACCUPRIL  Take 40 mg by mouth 2 (two) times daily.     rivaroxaban 10 MG Tabs tablet  Commonly known as:  XARELTO  - Take 1 tablet (10 mg total) by mouth daily with breakfast. Take Xarelto for two and a half more weeks, then discontinue Xarelto.  - Once the patient has completed the blood thinner regimen, then take a Baby 81 mg Aspirin daily for four more weeks.     traMADol 50 MG tablet  Commonly known as:  ULTRAM  Take 2 tablets (100 mg total) by mouth every 6 (six) hours as needed for pain (mild pain).     zolpidem 10 MG tablet  Commonly known as:  AMBIEN  Take 1 tablet (10 mg total) by mouth at bedtime as  needed for sleep.            Follow-up Information   Follow up with Loanne Drilling, MD. Schedule an appointment as soon as possible for a visit in 2 weeks.   Specialty:  Orthopedic Surgery   Contact information:   [address] [place] [address] [phone]       Signed: Patrica Duel 07/09/2013, 10:25 AM

## 2013-07-09 NOTE — Progress Notes (Signed)
Physical Therapy Treatment Patient Details Name: MORIS RATCHFORD MRN: [id number] DOB: 03/29/1935 Today's Date: 07/09/2013 Time: 4098-1191 PT Time Calculation (min): 19 min  PT Assessment / Plan / Recommendation  History of Present Illness s/p L TKA   PT Comments   POD # 3 am session.  Assisted out of recliner to amb in bathroom then perform shower transfer.  Pt instructed on safety and use of rails and bench.   Follow Up Recommendations  SNF     Does the patient have the potential to tolerate intense rehabilitation     Barriers to Discharge        Equipment Recommendations       Recommendations for Other Services    Frequency 7X/week   Progress towards PT Goals Progress towards PT goals: Progressing toward goals  Plan      Precautions / Restrictions Precautions Precautions: Fall Precaution Comments: D/C KI as pt was able to perform active SLR Restrictions Weight Bearing Restrictions: No Other Position/Activity Restrictions: WBAT    Pertinent Vitals/Pain C/o 3/10 pain "not bad'    Mobility  Bed Mobility Bed Mobility: Not assessed Details for Bed Mobility Assistance: Pt OOB in recliner Transfers Transfers: Sit to Stand;Stand to Sit Sit to Stand: 5: Supervision;From chair/3-in-1;4: Min guard Stand to Sit: 5: Supervision;To chair/3-in-1 Details for Transfer Assistance: increased time and one VC on safety with turns Ambulation/Gait Ambulation/Gait Assistance: 5: Supervision;4: Min guard Ambulation Distance (Feet): 12 Feet Assistive device: Rolling walker Ambulation/Gait Assistance Details: 75% VC's on proper transfer tech to get in/ot shower.  Gait Pattern: Step-to pattern;Step-through pattern Gait velocity: decreased     PT Goals (current goals can now be found in the care plan section)    Visit Information  Last PT Received On: 07/09/13 Assistance Needed: +1 History of Present Illness: s/p L TKA    Subjective Data      Cognition       Balance      End of Session PT - End of Session Equipment Utilized During Treatment: Gait belt Activity Tolerance: Patient tolerated treatment well Patient left: Other (comment) (in shower with NT in room)   Felecia Shelling  PTA [facility]  Acute  Rehab Pager      [phone]

## 2013-07-09 NOTE — Progress Notes (Signed)
Clinical Social Work  CSW faxed DC summary to [facility] who is agreeable to accept patient today. CSW prepared DC packet with FL2 and hard scripts included. MD did not leave a script for Ativan but CSW spoke with SNF who reports they can accept patient without that script. CSW informed patient and wife of DC. CSW had discussion with patient and family regarding transportation. CSW explained that ambulance could be arranged but no guarantee of payment. Patient and wife agreeable to [facility] for transport and aware that ambulance company will bill their insurance. PTAR request # [id number]. CSW is signing off but available if needed.  Unk Lightning, LCSW (Coverage for [employer])

## 2013-07-09 NOTE — Care Management Note (Signed)
    Page 1 of 1   07/09/2013     1:20:27 PM   CARE MANAGEMENT NOTE 07/09/2013  Patient:  DUANE, EARNSHAW   Account Number:  [id number]  Date Initiated:  07/08/2013  Documentation initiated by:  Colleen Can  Subjective/Objective Assessment:   DX TOTAL LEFT KNEE REPLACEMNT     Action/Plan:   SNF REHAB   Anticipated DC Date:  07/09/2013   Anticipated DC Plan:  SKILLED NURSING FACILITY  In-house referral  Clinical Social Worker      DC Planning Services  CM consult      Choice offered to / List presented to:             Status of service:  Completed, signed off Medicare Important Message given?  NA - LOS <3 / Initial given by admissions (If response is "NO", the following Medicare IM given date fields will be blank) Date Medicare IM given:   Date Additional Medicare IM given:    Discharge Disposition:  SKILLED NURSING FACILITY  Per UR Regulation:    If discussed at Long Length of Stay Meetings, dates discussed:    Comments:

## 2013-07-09 NOTE — Progress Notes (Signed)
Physical Therapy Treatment Patient Details Name: Julian Fowler MRN: [id number] DOB: Mar 11, 1935 Today's Date: 07/09/2013 Time: 3086-5784 PT Time Calculation (min): 14 min  PT Assessment / Plan / Recommendation  History of Present Illness s/p L TKA   PT Comments   POD # 3 am session.  Assisted out of recliner to amb in hallway then positioned in recliner with ICE.  Pt plans to D/C to SNF for ST Rehab.    Follow Up Recommendations  SNF     Does the patient have the potential to tolerate intense rehabilitation     Barriers to Discharge        Equipment Recommendations       Recommendations for Other Services    Frequency 7X/week   Progress towards PT Goals Progress towards PT goals: Progressing toward goals  Plan      Precautions / Restrictions Precautions Precautions: Fall Precaution Comments: D/C KI as pt was able to perform active SLR Restrictions Weight Bearing Restrictions: No Other Position/Activity Restrictions: WBAT    Pertinent Vitals/Pain C/o "soreness" ICE applied   Mobility  Bed Mobility Bed Mobility: Not assessed Details for Bed Mobility Assistance: Pt OOB in recliner Transfers Transfers: Sit to Stand;Stand to Sit Sit to Stand: 5: Supervision;From chair/3-in-1;4: Min guard Stand to Sit: 5: Supervision;To chair/3-in-1 Details for Transfer Assistance: increased time and one VC on safety with turns Ambulation/Gait Ambulation/Gait Assistance: 5: Supervision;4: Min guard Ambulation Distance (Feet): 375 Feet Assistive device: Rolling walker Ambulation/Gait Assistance Details: <25% Vc's on safety with backward gait Gait Pattern: Step-to pattern;Step-through pattern Gait velocity: decreased Stairs: Yes Stairs Assistance: 4: Min guard Stairs Assistance Details (indicate cue type and reason): using B rail and one initial VC on proper sequencing Stair Management Technique: Two rails;Forwards Number of Stairs: 4     PT Goals (current goals can now be found  in the care plan section)    Visit Information  Last PT Received On: 07/09/13 Assistance Needed: +1 History of Present Illness: s/p L TKA    Subjective Data      Cognition       Balance     End of Session PT - End of Session Equipment Utilized During Treatment: Gait belt Activity Tolerance: Patient tolerated treatment well Patient left: Other (comment) (in shower with NT in room)   Felecia Shelling  PTA [facility]  Acute  Rehab Pager      [phone]

## 2013-07-09 NOTE — Progress Notes (Signed)
   Subjective: 3 Days Post-Op Procedure(s) (LRB): LEFT TOTAL KNEE ARTHROPLASTY (Left) Patient reports pain as mild.   Patient seen in rounds with Dr. Lequita Halt. Patient is well, and has had no acute complaints or problems Patient is ready to go to [facility].  Objective: Vital signs in last 24 hours: Temp:  [98.5 F (36.9 C)] 98.5 F (36.9 C) (09/25 0403) Pulse Rate:  [63-71] 63 (09/25 0403) Resp:  [15-16] 16 (09/25 0403) BP: (156-160)/(78-83) 156/83 mmHg (09/25 0403) SpO2:  [95 %-96 %] 95 % (09/25 0403)  Intake/Output from previous day: No intake or output data in the 24 hours ending 07/09/13 1007  Intake/Output this shift:    Labs:  Recent Labs  07/07/13 0420 07/08/13 0410 07/09/13 0400  HGB 11.6* 10.3* 9.6*    Recent Labs  07/08/13 0410 07/09/13 0400  WBC 13.5* 10.9*  RBC 3.46* 3.36*  HCT 28.9* 28.3*  PLT 286 288    Recent Labs  07/07/13 0420 07/08/13 0410  NA 122* 124*  K 2.7* 3.7  CL 86* 91*  CO2 24 25  BUN 8 9  CREATININE 0.54 0.50  GLUCOSE 177* 131*  CALCIUM 8.8 8.8   No results found for this basename: LABPT, INR,  in the last 72 hours  EXAM: General - Patient is Alert, Appropriate and Oriented Extremity - Neurovascular intact Sensation intact distally Dorsiflexion/Plantar flexion intact Incision - clean, dry, no drainage, healing Motor Function - intact, moving foot and toes well on exam.   Assessment/Plan: 3 Days Post-Op Procedure(s) (LRB): LEFT TOTAL KNEE ARTHROPLASTY (Left) Procedure(s) (LRB): LEFT TOTAL KNEE ARTHROPLASTY (Left) Past Medical History  Diagnosis Date  . Anxiety     codependent relationship with irresponsible son  . Depression   . GERD (gastroesophageal reflux disease)   . Hyperlipidemia   . Hypertension December 29, 2009    renal artery Doppler , normal  12/2009  . Abdominal aorta injury     Normal size, ultrasound, March 17,2011  . Asbestos exposure     Hx of asbestos related pleural plaques  . Overactive  bladder   . ED (erectile dysfunction)   . Arthritis   . Hearing loss in left ear   . Baker's cyst     Left leg  . SVT (supraventricular tachycardia)     AVNRT, AV nodal reentry tachycardia  . Allergic rhinitis   . Syncope December 2010    09/2009  felt to be vasovagal or micturition syncope  . Chest pain     nuclear, 10/2007, normal  . Ejection fraction     60%, echo, 2008, mild RV dysfunction  . Easy bruisability   . H/O sleep apnea     resolved with weight loss  . BPH (benign prostatic hyperplasia)   . OSA (obstructive sleep apnea)     moderate -NPSG 03/02/08 AHI 23 cpap 8..06/30/13 PT DENIES SLEEP APNEA OR CPAP MACHINE   Principal Problem:   OA (osteoarthritis) of knee Active Problems:   Hyponatremia   Hypokalemia  Estimated body mass index is 30.26 kg/(m^2) as calculated from the following:   Height as of this encounter: 5\' 9"  (1.753 m).   Weight as of this encounter: 92.987 kg (205 lb). Discharge to SNF Diet - Cardiac diet Follow up - in 2 weeks Activity - WBAT Disposition - Skilled nursing facility Condition Upon Discharge - Good D/C Meds - See DC Summary DVT Prophylaxis - Xarelto  Ardys Hataway 07/09/2013, 10:07 AM

## 2013-07-10 ENCOUNTER — Other Ambulatory Visit: Payer: Self-pay | Admitting: *Deleted

## 2013-07-10 MED ORDER — TRAMADOL HCL 50 MG PO TABS: 100.0000 mg | ORAL_TABLET | Freq: Four times a day (QID) | ORAL | Status: DC | PRN

## 2013-07-10 MED ORDER — ZOLPIDEM TARTRATE 10 MG PO TABS: 10.0000 mg | ORAL_TABLET | Freq: Every evening | ORAL | Status: AC | PRN

## 2013-07-10 MED ORDER — LORAZEPAM 1 MG PO TABS: 1.0000 mg | ORAL_TABLET | Freq: Every day | ORAL | Status: DC

## 2013-07-10 MED ORDER — OXYCODONE HCL 5 MG PO TABS: 5.0000 mg | ORAL_TABLET | ORAL | Status: DC | PRN

## 2013-07-12 ENCOUNTER — Inpatient Hospital Stay
Admission: RE | Admit: 2013-07-12 | Discharge: 2013-08-01 | Disposition: A | Discharge status: Home / Self Care | Payer: Medicare Other | Source: Ambulatory Visit | Attending: Internal Medicine | Admitting: Internal Medicine

## 2013-07-12 ENCOUNTER — Encounter (HOSPITAL_COMMUNITY): Payer: Self-pay

## 2013-07-12 ENCOUNTER — Emergency Department (HOSPITAL_COMMUNITY)
Admission: EM | Admit: 2013-07-12 | Discharge: 2013-07-12 | Disposition: A | Discharge status: Home / Self Care | Payer: Medicare Other | Attending: Emergency Medicine | Admitting: Emergency Medicine

## 2013-07-12 DIAGNOSIS — F411 Generalized anxiety disorder: Secondary | ICD-10-CM | POA: Insufficient documentation

## 2013-07-12 DIAGNOSIS — Z79899 Other long term (current) drug therapy: Secondary | ICD-10-CM | POA: Insufficient documentation

## 2013-07-12 DIAGNOSIS — Z87448 Personal history of other diseases of urinary system: Secondary | ICD-10-CM | POA: Insufficient documentation

## 2013-07-12 DIAGNOSIS — T148XXA Other injury of unspecified body region, initial encounter: Secondary | ICD-10-CM

## 2013-07-12 DIAGNOSIS — M25569 Pain in unspecified knee: Secondary | ICD-10-CM | POA: Insufficient documentation

## 2013-07-12 DIAGNOSIS — K219 Gastro-esophageal reflux disease without esophagitis: Secondary | ICD-10-CM | POA: Insufficient documentation

## 2013-07-12 DIAGNOSIS — T07XXXA Unspecified multiple injuries, initial encounter: Secondary | ICD-10-CM | POA: Diagnosis not present

## 2013-07-12 DIAGNOSIS — R209 Unspecified disturbances of skin sensation: Secondary | ICD-10-CM | POA: Diagnosis not present

## 2013-07-12 DIAGNOSIS — IMO0002 Reserved for concepts with insufficient information to code with codable children: Secondary | ICD-10-CM | POA: Insufficient documentation

## 2013-07-12 DIAGNOSIS — M7989 Other specified soft tissue disorders: Secondary | ICD-10-CM | POA: Diagnosis not present

## 2013-07-12 DIAGNOSIS — F3289 Other specified depressive episodes: Secondary | ICD-10-CM | POA: Insufficient documentation

## 2013-07-12 DIAGNOSIS — E785 Hyperlipidemia, unspecified: Secondary | ICD-10-CM | POA: Insufficient documentation

## 2013-07-12 DIAGNOSIS — Z8669 Personal history of other diseases of the nervous system and sense organs: Secondary | ICD-10-CM | POA: Insufficient documentation

## 2013-07-12 DIAGNOSIS — Z8709 Personal history of other diseases of the respiratory system: Secondary | ICD-10-CM | POA: Insufficient documentation

## 2013-07-12 DIAGNOSIS — Z87891 Personal history of nicotine dependence: Secondary | ICD-10-CM | POA: Insufficient documentation

## 2013-07-12 DIAGNOSIS — Y838 Other surgical procedures as the cause of abnormal reaction of the patient, or of later complication, without mention of misadventure at the time of the procedure: Secondary | ICD-10-CM | POA: Insufficient documentation

## 2013-07-12 DIAGNOSIS — M129 Arthropathy, unspecified: Secondary | ICD-10-CM | POA: Insufficient documentation

## 2013-07-12 DIAGNOSIS — Z77090 Contact with and (suspected) exposure to asbestos: Secondary | ICD-10-CM | POA: Insufficient documentation

## 2013-07-12 DIAGNOSIS — M25461 Effusion, right knee: Principal | ICD-10-CM

## 2013-07-12 DIAGNOSIS — F329 Major depressive disorder, single episode, unspecified: Secondary | ICD-10-CM | POA: Insufficient documentation

## 2013-07-12 DIAGNOSIS — S8000XA Contusion of unspecified knee, initial encounter: Secondary | ICD-10-CM | POA: Insufficient documentation

## 2013-07-12 DIAGNOSIS — Z96659 Presence of unspecified artificial knee joint: Secondary | ICD-10-CM | POA: Insufficient documentation

## 2013-07-12 DIAGNOSIS — I10 Essential (primary) hypertension: Secondary | ICD-10-CM | POA: Insufficient documentation

## 2013-07-12 LAB — BASIC METABOLIC PANEL
BUN: 10 mg/dL (ref 6–23)
CO2: 27 mEq/L (ref 19–32)
Calcium: 8.6 mg/dL (ref 8.4–10.5)
Chloride: 91 mEq/L — ABNORMAL LOW (ref 96–112)
Creatinine, Ser: 0.72 mg/dL (ref 0.50–1.35)
GFR calc Af Amer: >90 mL/min (ref >90)
GFR calc non Af Amer: 88 mL/min — ABNORMAL LOW (ref >90)
Glucose, Bld: 99 mg/dL (ref 70–99)
Sodium: 125 mEq/L — ABNORMAL LOW (ref 135–145)

## 2013-07-12 LAB — CBC WITH DIFFERENTIAL/PLATELET
Basophils Absolute: 0 10*3/uL (ref 0.0–0.1)
Basophils Relative: 0 % (ref 0–1)
Eosinophils Absolute: 0.2 10*3/uL (ref 0.0–0.7)
Lymphocytes Relative: 20 % (ref 12–46)
MCHC: 34.4 g/dL (ref 30.0–36.0)
Monocytes Absolute: 1.3 10*3/uL — ABNORMAL HIGH (ref 0.1–1.0)
Monocytes Relative: 16 % — ABNORMAL HIGH (ref 3–12)
Neutro Abs: 5.1 10*3/uL (ref 1.7–7.7)
Platelets: 355 10*3/uL (ref 150–400)
RDW: 13.6 % (ref 11.5–15.5)
WBC: 8.1 10*3/uL (ref 4.0–10.5)

## 2013-07-12 LAB — URINALYSIS, ROUTINE W REFLEX MICROSCOPIC
Bilirubin Urine: NEGATIVE
Glucose, UA: NEGATIVE mg/dL
Hgb urine dipstick: NEGATIVE
Ketones, ur: NEGATIVE mg/dL
Leukocytes, UA: NEGATIVE
Nitrite: NEGATIVE
Protein, ur: NEGATIVE mg/dL
pH: 7 (ref 5.0–8.0)

## 2013-07-12 MED ORDER — ASPIRIN 81 MG PO CHEW: 324.0000 mg | CHEWABLE_TABLET | Freq: Once | ORAL | Status: DC

## 2013-07-12 MED ORDER — SODIUM CHLORIDE 0.9 % IV SOLN
INTRAVENOUS | Status: DC
  Administered 2013-07-12: 12:00:00 via INTRAVENOUS

## 2013-07-12 MED ORDER — NITROGLYCERIN 2 % TD OINT: 1.0000 [in_us] | TOPICAL_OINTMENT | Freq: Once | TRANSDERMAL | Status: DC

## 2013-07-12 NOTE — ED Provider Notes (Signed)
CSN: [id number]     Arrival date & time 07/12/13  1025 History  This chart was scribed for Ward Givens, MD by Blanchard Kelch, ED Scribe. The patient was seen in room APA14/APA14. Patient's care was started at 10:42 AM.    Chief Complaint  Patient presents with  . Knee Pain    Patient is a 78 y.o. male presenting with knee pain. The history is provided by the patient. No language interpreter was used.  Knee Pain   HPI Comments: Julian Fowler is a 79 y.o. male who presents to the Emergency Department complaining of constant worsening redness and slight numbness in left knee after having a total knee replacement on 07/06/13. The patient left the hospital on 9/25. He has been in the [facility] since then for post op care. The patient has also has worsening bruising to the area. His finance reports his temperature has been running around 99 the past few days. He denies chest pain, shortness of breath, or chills. He has been taking Xarelto for a blood thinner regimen. He states he has only been having ice packs now and then and his leg hasn't been elevated. He states the swelling has been constant since surgery but is worse today to the point his knee feels numb.   PCP Dr Leandrew Koyanagi Orthopedist Dr Lequita Halt  Past Medical History  Diagnosis Date  . Anxiety     codependent relationship with irresponsible son  . Depression   . GERD (gastroesophageal reflux disease)   . Hyperlipidemia   . Hypertension December 29, 2009    renal artery Doppler , normal  12/2009  . Abdominal aorta injury     Normal size, ultrasound, March 17,2011  . Asbestos exposure     Hx of asbestos related pleural plaques  . Overactive bladder   . ED (erectile dysfunction)   . Arthritis   . Hearing loss in left ear   . Baker's cyst     Left leg  . SVT (supraventricular tachycardia)     AVNRT, AV nodal reentry tachycardia  . Allergic rhinitis   . Syncope December 2010    09/2009  felt to be vasovagal or micturition syncope   . Chest pain     nuclear, 10/2007, normal  . Ejection fraction     60%, echo, 2008, mild RV dysfunction  . Easy bruisability   . H/O sleep apnea     resolved with weight loss  . BPH (benign prostatic hyperplasia)   . OSA (obstructive sleep apnea)     moderate -NPSG 03/02/08 AHI 23 cpap 8..06/30/13 PT DENIES SLEEP APNEA OR CPAP MACHINE   Past Surgical History  Procedure Laterality Date  . Cholecystectomy  6/98  . Craniectomy for excision of acoustic neuroma  3/95  . Trigger finger release  2003    (thumb) middle finger (2006)  . Cerebral embolization  12/2011  . Brain surgery    . Back surgery      herniated L1, L2   Dr Channing Mutters  . Total knee arthroplasty Left 07/06/2013    Procedure: LEFT TOTAL KNEE ARTHROPLASTY;  Surgeon: Loanne Drilling, MD;  Location: [facility] ORS;  Service: Orthopedics;  Laterality: Left;   Family History  Problem Relation Age of Onset  . Cancer Father     oral cancer  . Breast cancer Mother   . Heart attack Mother   . Hypertension Sister     Bypass x4  . Alcohol abuse Sister   . Alzheimer's  disease Sister   . Kidney disease Sister    History  Substance Use Topics  . Smoking status: Former Smoker -- 1.50 packs/day for 30 years    Types: Cigarettes    Quit date: 10/15/1986  . Smokeless tobacco: Never Used  . Alcohol Use: No     Comment: Used to drink heavily at times  lives at home   Review of Systems  All other systems reviewed and are negative.    Allergies  Codeine and Tape  Home Medications   Current Outpatient Rx  Name  Route  Sig  Dispense  Refill  . acetaminophen (TYLENOL) 325 MG tablet   Oral   Take 325 mg by mouth every 6 (six) hours as needed for pain.          Marland Kitchen alum hydroxide-mag trisilicate (GAVISCON) 80-20 MG CHEW chewable tablet   Oral   Chew 1-2 tablets by mouth 4 (four) times daily as needed.   20 tablet   0   . amLODipine (NORVASC) 2.5 MG tablet   Oral   Take 2.5 mg by mouth every morning.         . bisacodyl  (DULCOLAX) 10 MG suppository   Rectal   Place 1 suppository (10 mg total) rectally daily as needed.   12 suppository   0   . bisacodyl (DULCOLAX) 5 MG EC tablet   Oral   Take 1-2 tablets (5-10 mg total) by mouth daily as needed.   30 tablet   0   . diltiazem (CARDIZEM CD) 120 MG 24 hr capsule   Oral   Take 120 mg by mouth 2 (two) times daily.         Marland Kitchen docusate sodium 100 MG CAPS   Oral   Take 100 mg by mouth 2 (two) times daily.   10 capsule   0   . fexofenadine (ALLEGRA) 180 MG tablet   Oral   Take 180 mg by mouth daily as needed. For allergies         . latanoprost (XALATAN) 0.005 % ophthalmic solution   Both Eyes   Place 1 drop into both eyes at bedtime.          Marland Kitchen LORazepam (ATIVAN) 1 MG tablet   Oral   Take 1 tablet (1 mg total) by mouth at bedtime.   30 tablet   5   . losartan (COZAAR) 50 MG tablet   Oral   Take 50 mg by mouth every morning.         Marland Kitchen omeprazole (PRILOSEC) 20 MG capsule   Oral   Take 20 mg by mouth daily. Take 1 tablet once daily thirty minutes before a meal         . ondansetron (ZOFRAN) 4 MG tablet   Oral   Take 1 tablet (4 mg total) by mouth every 6 (six) hours as needed for nausea.   40 tablet   0   . oxyCODONE (OXY IR/ROXICODONE) 5 MG immediate release tablet   Oral   Take 1-2 tablets (5-10 mg total) by mouth every 3 (three) hours as needed.   360 tablet   0   . polyethylene glycol (MIRALAX / GLYCOLAX) packet   Oral   Take 17 g by mouth daily as needed.         . potassium chloride SA (K-DUR,KLOR-CON) 20 MEQ tablet   Oral   Take 20 mEq by mouth daily.         Marland Kitchen  pravastatin (PRAVACHOL) 20 MG tablet   Oral   Take 20 mg by mouth at bedtime.           . quinapril (ACCUPRIL) 40 MG tablet   Oral   Take 40 mg by mouth 2 (two) times daily.         . rivaroxaban (XARELTO) 10 MG TABS tablet   Oral   Take 1 tablet (10 mg total) by mouth daily with breakfast. Take Xarelto for two and a half more weeks, then  discontinue Xarelto. Once the patient has completed the blood thinner regimen, then take a Baby 81 mg Aspirin daily for four more weeks.   18 tablet   0   . traMADol (ULTRAM) 50 MG tablet   Oral   Take 2 tablets (100 mg total) by mouth every 6 (six) hours as needed for pain (mild pain).   240 tablet   0   . zolpidem (AMBIEN) 10 MG tablet   Oral   Take 1 tablet (10 mg total) by mouth at bedtime as needed for sleep.   30 tablet   0     No refills will be given by Myrt[facility]    Triage Vitals: BP 125/49  Pulse 69  Temp(Src) 98.8 F (37.1 C) (Oral)  Resp 18  Ht 5\' 9"  (1.753 m)  Wt 209 lb (94.802 kg)  BMI 30.85 kg/m2  SpO2 100%  Vital signs normal    Physical Exam  Nursing note and vitals reviewed. Constitutional: He is oriented to person, place, and time. He appears well-developed and well-nourished.  Non-toxic appearance. He does not appear ill. No distress.  HENT:  Head: Normocephalic and atraumatic.  Right Ear: External ear normal.  Left Ear: External ear normal.  Nose: Nose normal. No mucosal edema or rhinorrhea.  Mouth/Throat: Mucous membranes are normal. No dental abscesses or edematous.  Eyes: Conjunctivae and EOM are normal. Pupils are equal, round, and reactive to light.  Neck: Normal range of motion and full passive range of motion without pain. Neck supple.  Pulmonary/Chest: Effort normal. No respiratory distress. He has no rhonchi. He exhibits no crepitus.  Abdominal: Normal appearance.  Musculoskeletal: Normal range of motion.       Legs: Lateral left thigh has purple discoloration. He has purple discoloration of medial thigh from groin to ankle with diffuse swelling. Moderate swelling of thigh and knee with warmth of knee and redness. The incision appears intact with small amount of dry drainage on steri-strips.   Neurological: He is alert and oriented to person, place, and time. He has normal strength. No cranial nerve deficit.  Skin: Skin is warm, dry and  intact. No rash noted. No pallor.  Psychiatric: He has a normal mood and affect. His speech is normal and behavior is normal. His mood appears not anxious.    ED Course  Procedures (including critical care time)  Medications  0.9 %  sodium chloride infusion ( Intravenous New Bag/Given 07/12/13 1224)     DIAGNOSTIC STUDIES: Oxygen Saturation is 100% on room air, normal by my interpretation.    COORDINATION OF CARE: 11:58 AM -Will order CBC, BMP and urinalysis. Will admit to [facility] for follow up with his surgeon, Dr. Lequita Halt, in case of infection.  Patient verbalizes understanding and agrees with treatment plan.  14:30 D/W Cammie Mcgee nurse at [facility], Dr Charlann Boxer is operating and will call me back  13:40 I called Sue Lush again, discussed he is febrile here, WBC is normal. Pt has been  able to go to Rehab and states he is ambulatory.  States to stop the xarelto, elevate the leg, ice the leg and he can be seen in the office tomorrow or next day.   Have discussed the discharge plan with patient and his daughter. Pt states he is able to walk. Advised to stop the xarelto until he sees Dr Lequita Halt and to elevate his leg any time he isn't walking and to use ice packs more. He states his rehab isn't giving him the ice packs and they don't have pillows to elevate his leg. Pt given ice packs in the ED to use and his daughter is going to bring more pillows from home.     Results for orders placed during the hospital encounter of 07/12/13  CBC WITH DIFFERENTIAL      Result Value Range   WBC 8.1  4.0 - 10.5 K/uL   RBC 3.16 (*) 4.22 - 5.81 MIL/uL   Hemoglobin 9.4 (*) 13.0 - 17.0 g/dL   HCT 29.5 (*) 62.1 - 30.8 %   MCV 86.4  78.0 - 100.0 fL   MCH 29.7  26.0 - 34.0 pg   MCHC 34.4  30.0 - 36.0 g/dL   RDW 65.7  84.6 - 96.2 %   Platelets 355  150 - 400 K/uL   Neutrophils Relative % 62  43 - 77 %   Neutro Abs 5.1  1.7 - 7.7 K/uL   Lymphocytes Relative 20  12 - 46 %   Lymphs Abs 1.6  0.7 - 4.0 K/uL    Monocytes Relative 16 (*) 3 - 12 %   Monocytes Absolute 1.3 (*) 0.1 - 1.0 K/uL   Eosinophils Relative 2  0 - 5 %   Eosinophils Absolute 0.2  0.0 - 0.7 K/uL   Basophils Relative 0  0 - 1 %   Basophils Absolute 0.0  0.0 - 0.1 K/uL  BASIC METABOLIC PANEL      Result Value Range   Sodium 125 (*) 135 - 145 mEq/L   Potassium 4.2  3.5 - 5.1 mEq/L   Chloride 91 (*) 96 - 112 mEq/L   CO2 27  19 - 32 mEq/L   Glucose, Bld 99  70 - 99 mg/dL   BUN 10  6 - 23 mg/dL   Creatinine, Ser 9.52  0.50 - 1.35 mg/dL   Calcium 8.6  8.4 - 84.1 mg/dL   GFR calc non Af Amer 88 (*) >90 mL/min   GFR calc Af Amer >90  >90 mL/min  URINALYSIS, ROUTINE W REFLEX MICROSCOPIC      Result Value Range   Color, Urine YELLOW  YELLOW   APPearance CLEAR  CLEAR   Specific Gravity, Urine <1.005 (*) 1.005 - 1.030   pH 7.0  5.0 - 8.0   Glucose, UA NEGATIVE  NEGATIVE mg/dL   Hgb urine dipstick NEGATIVE  NEGATIVE   Bilirubin Urine NEGATIVE  NEGATIVE   Ketones, ur NEGATIVE  NEGATIVE mg/dL   Protein, ur NEGATIVE  NEGATIVE mg/dL   Urobilinogen, UA 0.2  0.0 - 1.0 mg/dL   Nitrite NEGATIVE  NEGATIVE   Leukocytes, UA NEGATIVE  NEGATIVE   Laboratory interpretation all normal except anemia, hyponatremia, low chloride    MDM   1. Left leg swelling   2. Hematoma    Plan discharge   Devoria Albe, MD, FACEP   I personally performed the services described in this documentation, which was scribed in my presence. The recorded information has been reviewed  and considered.  Devoria Albe, MD, FACEP    Ward Givens, MD 07/12/13 [phone]

## 2013-07-12 NOTE — ED Notes (Signed)
Pt here from [facility] for evaluation of pain and swelling to left knee

## 2013-07-12 NOTE — ED Notes (Signed)
Pt provided a urinal, wife at bedside

## 2013-07-12 NOTE — ED Notes (Signed)
MD at bedside. 

## 2013-07-13 ENCOUNTER — Non-Acute Institutional Stay (SKILLED_NURSING_FACILITY): Payer: Medicare Other | Admitting: Internal Medicine

## 2013-07-13 DIAGNOSIS — E871 Hypo-osmolality and hyponatremia: Secondary | ICD-10-CM

## 2013-07-13 DIAGNOSIS — I10 Essential (primary) hypertension: Secondary | ICD-10-CM | POA: Diagnosis not present

## 2013-07-13 DIAGNOSIS — E876 Hypokalemia: Secondary | ICD-10-CM

## 2013-07-13 DIAGNOSIS — IMO0002 Reserved for concepts with insufficient information to code with codable children: Secondary | ICD-10-CM | POA: Diagnosis not present

## 2013-07-13 DIAGNOSIS — M2500 Hemarthrosis, unspecified joint: Secondary | ICD-10-CM

## 2013-07-13 NOTE — Progress Notes (Signed)
Patient ID: Julian Fowler, male   DOB: 1935-01-19, 78 y.o.   MRN: [id number]  Facility; [facility] Chief complaint; admission to SNF post admit to [facility] from September 22 to September 25  History; this patient underwent an elective left total knee replacement for severe advanced osteoarthritis of the left knee which was refractory to standard medical management the. Radiographs showed bone-on-bone arthritis. He was admitted electively for left total knee replacement. Postoperative complications included blood loss anemia with extensive bruising into the left leg. For this reason his xarelto that he was put on postoperatively for DVT prophylaxis was discontinued the. He also seems to have had issues with the postoperative hypokalemia with a potassium of 2.7 as well as hyponatremia. As far as his sodium is concerned it would appear that his preoperative sodium was 130 yesterday when he was sent to [facility] our group concern for a left knee hematoma his sodium was 125 potassium of 4.2 his hemoglobin was 9.4. The patient complains of pain however is not systemically unwell he is able to weight bear. He has a followup with orthopedics tomorrow  Past Medical History  Diagnosis Date  . Anxiety     codependent relationship with irresponsible son  . Depression   . GERD (gastroesophageal reflux disease)   . Hyperlipidemia   . Hypertension December 29, 2009    renal artery Doppler , normal  12/2009  . Abdominal aorta injury     Normal size, ultrasound, March 17,2011  . Asbestos exposure     Hx of asbestos related pleural plaques  . Overactive bladder   . ED (erectile dysfunction)   . Arthritis   . Hearing loss in left ear   . Baker's cyst     Left leg  . SVT (supraventricular tachycardia)     AVNRT, AV nodal reentry tachycardia  . Allergic rhinitis   . Syncope December 2010    09/2009  felt to be vasovagal or micturition syncope  . Chest pain     nuclear, 10/2007, normal  . Ejection fraction    60%, echo, 2008, mild RV dysfunction  . Easy bruisability   . H/O sleep apnea     resolved with weight loss  . BPH (benign prostatic hyperplasia)   . OSA (obstructive sleep apnea)     moderate -NPSG 03/02/08 AHI 23 cpap 8..06/30/13 PT DENIES SLEEP APNEA OR CPAP MACHINE   Past Surgical History  Procedure Laterality Date  . Cholecystectomy  6/98  . Craniectomy for excision of acoustic neuroma  3/95  . Trigger finger release  2003    (thumb) middle finger (2006)  . Cerebral embolization  12/2011  . Brain surgery    . Back surgery      herniated L1, L2   Dr Channing Mutters  . Total knee arthroplasty Left 07/06/2013    Procedure: LEFT TOTAL KNEE ARTHROPLASTY;  Surgeon: Loanne Drilling, MD;  Location: [facility];  Service: Orthopedics;  Laterality: Left;   Medication Tylenol 325 every 6 hours when necessary, Norvasc 2.5 daily, Hygroton 25 every morning, Cardizem CD 120 3 times a day, XL at 10 0.005% one drop into the eyes at bedtime, Ativan 1 mg at at bedtime, Cozaar 50 mg every morning, omeprazole 20 mg daily, potassium 20 mEq daily, pravastatin 20 mg at bedtime, quinapril 40 mg twice a day, Ambien 10 mg each bedtime when necessary, he has a long list of when necessary meds  Social history; lives near [place]. Independent with ADLs and  IADLs. Swims for 2 miles 3 times a week  reports that he quit smoking about 26 years ago. His smoking use included Cigarettes. He has a 45 pack-year smoking history. He has never used smokeless tobacco. He reports that he does not drink alcohol or use illicit drugs.  Family History  Problem Relation Age of Onset  . Cancer Father     oral cancer  . Breast cancer Mother   . Heart attack Mother   . Hypertension Sister     Bypass x4  . Alcohol abuse Sister   . Alzheimer's disease Sister   . Kidney disease Sister     Review of systems; Gen; no weight loss Respiratory; no shortness of breath, no cough, Cardiac; no exertional chest pain, no palpitations GI; no  abnormal pain, no change in bowel habits GU; no dysuria, no voiding difficulties Musculoskeletal; very concerned about swelling and erythema in the medial aspect of the right knee. There is extensive bruising in his posterior thought it all the way down to the ankle.   Physical examination; Vitals; O2 sat 96% respiratory rate 18 pulse 62 Gen.; the patient does not appear to be in any distress Respiratory; clear air entry bilaterally, no wheezing,  Cardiac; S1-S2 normal, no gallops, no murmurs, jugular venous pressure is not elevated Abdomen; no liver no spleen, no tenderness, no masses Extremities; peripheral pulp pulses are palpably normal Musculoskeletal; there is extensive bruising as noted above. There is swelling on the medial aspect of the right knee. There is clearly fluid on the knee. The patient is very concerned that this is infection however I don't believe this is what this is. This is probably some degree of hemarthrosis. He is wondering whether the fluid needs to be removed. I told him to wait for orthopedics to look at this tomorrow Neurologic; cranial nerves within normal limits, motor strength and tone normal, reflexes normal, plantar responses flexor Mental status; no evidence of cognitive loss or depression  Impression/plan #1 status post left total knee replacement. There is perioperative blood loss. His hemoglobin was 9.4 in the ER yesterday. It was 9.6 on 9/25. This appears to stabilized. He is to see orthopedics tomorrow. I don't think anything needs to be done about this. He appears to be rehabilitating well in spite of that. #2 hyponatremia down as low as 125 in the hospital. This is concerning in older people. His chlorthalidone is already on hold I will liberalize his salt intake. He appears to be euvolemic at. I will recheck this in 48 hours. If this continues to correct and I don't think any further tests will be necessary. His BUN and creatinine are normal #3 severe  hypokalemia postoperative period on September 23 his potassium was 2.7 it was 4.2 yesterday he is on potassium replacement. #4 probably fairly refractory hypertension judging by the fact that he has had a renal artery Doppler in 2011. This will need to be monitored it is running in the 140-150 systolic. #5 sleep apnea apparently resolved with weight loss. He is not on any nocturnal support. #6 asbestos exposure with a history of related pleural plaques.

## 2013-07-14 DIAGNOSIS — Z96659 Presence of unspecified artificial knee joint: Secondary | ICD-10-CM | POA: Diagnosis not present

## 2013-07-15 ENCOUNTER — Non-Acute Institutional Stay (SKILLED_NURSING_FACILITY): Payer: Medicare Other | Admitting: Internal Medicine

## 2013-07-15 DIAGNOSIS — IMO0002 Reserved for concepts with insufficient information to code with codable children: Secondary | ICD-10-CM

## 2013-07-15 DIAGNOSIS — E876 Hypokalemia: Secondary | ICD-10-CM

## 2013-07-15 DIAGNOSIS — E871 Hypo-osmolality and hyponatremia: Secondary | ICD-10-CM

## 2013-07-15 DIAGNOSIS — M2500 Hemarthrosis, unspecified joint: Secondary | ICD-10-CM

## 2013-07-17 ENCOUNTER — Non-Acute Institutional Stay (SKILLED_NURSING_FACILITY): Payer: Medicare Other | Admitting: Internal Medicine

## 2013-07-17 DIAGNOSIS — R609 Edema, unspecified: Secondary | ICD-10-CM | POA: Diagnosis not present

## 2013-07-17 DIAGNOSIS — Z96659 Presence of unspecified artificial knee joint: Secondary | ICD-10-CM | POA: Diagnosis not present

## 2013-07-17 DIAGNOSIS — Z96652 Presence of left artificial knee joint: Secondary | ICD-10-CM

## 2013-07-17 NOTE — Progress Notes (Signed)
Patient ID: Julian Fowler, male   DOB: August 08, 1935, 78 y.o.   MRN: [id number]  This is an acute visit.  Level of care skilled.  Facility [facility].  Chief complaint-acute visit secondary to left knee edema erythema.  History of present illness.  Patient is a pleasant elderly male who recently underwent an elective left knee replacement-postop he did develop some significant edema thought to be some bleeding into the knee he also had hyponatremia this was thought secondary to his diuretic we have been monitoring this sodium today was 128 which appears to be an improvement from the hospital.  His diuretic has been discontinued.  His main concern tonight is the swelling of his left leg he is quite concerned about this--he was apparently seen by by his orthopedic doctor earlier this week but is still quite concerned.  I have reviewed the orthopedic note he says there is some swelling here but is looking better.  Family medical social history has been reviewed per history and physical on 07/13/2013 as well as Dr. Jannetta Quint note on 07/15/2013.  Medications have been reviewed per MAR.  Review of systems.  In general denies fever or chills but is quite anxious about his knee.  Skin-does have erythema of his knee that he's quite concerned about.  Respiratory does not complain of any shortness of breath or cough.  Cardiac does not complaining of chest pain.  GI-is not complaining of any abdominal pain nausea or vomiting.  Muscle skeletal does have some leg discomfort apparently the cocci is helping here every 4 hours he is very concerned about the edema and erythema.  Neurologic he is not complaining of any numbness or tingling.  Psych-appears somewhat anxious mainly because of his knee but is pleasant and appropriate.  Physical exam.  Temperature is 98.5 pulse 60 respirations 20 blood pressure 122/54 his weight is 210 this appears to be relatively baseline with his admission weight about a  week ago.  In general this is a fairly well-nourished elderly male in no distress resting in bed.  His skin is warm and dry-- left knee area there is some postop erythema at the surgical site-- the scar actually appears to be healing quite unremarkably without any drainage or bleeding there is some mild warmth here but this does not appear to be atypical.   there is some edema of his left knee area and leg with some bruising under his knee  Chest is clear to auscultation without any labored breathing.  Heart is regular rate and rhythm without murmur gallop or rub again he does have the edema of his left leg.  Abdomen is soft nontender with active bowel sounds.  Muscle skeletal is able to move all other extremities it appears at baseline and appropriate strength and range of motion except for his right leg issues as noted above.  Neurologic is grossly intact touch sensation appears to be intact left lower extremity.  Psych he is alert and oriented x3 pleasant and appropriate although somewhat anxious about his knee.  Labs.  07/17/2013.  Sodium 128 potassium 4.6 BUN 7 creatinine 0.75.  Hemoglobin 9.6 platelets 288.  Assessment and plan.  #1-left knee issues status post replacement-patient is quite anxious about this secondary to the edema will order a venous Doppler of the left leg to rule out DVT also encouraged elevation of the left leg-we'll weigh   patient tomorrow notify provider gain greater than 3 pounds--- At this point I do not see a sign of infection--apparently  there is some history of bleeding into the joint and a CBC has been ordered for first laboratory date next week--his hemoglobin appears to be stable and somewhat improved from the hospital.  #2-hyponatremia-this appears to have stabilized  an update metabolic panel has been ordered for first laboratory date next week.  [id number] note greater than 25 minutes spent assessing patient-reviewing his medical records--an  extensive discussion with patient in room about the status of his knee-and formulating a plan of care that included ordering the venous Doppler and weights

## 2013-07-18 ENCOUNTER — Inpatient Hospital Stay (HOSPITAL_COMMUNITY): Payer: Medicare Other | Attending: Internal Medicine

## 2013-07-18 DIAGNOSIS — Z96659 Presence of unspecified artificial knee joint: Secondary | ICD-10-CM | POA: Diagnosis not present

## 2013-07-18 DIAGNOSIS — M79609 Pain in unspecified limb: Secondary | ICD-10-CM | POA: Diagnosis not present

## 2013-07-20 ENCOUNTER — Non-Acute Institutional Stay (SKILLED_NURSING_FACILITY): Payer: Medicare Other | Admitting: Internal Medicine

## 2013-07-20 DIAGNOSIS — E876 Hypokalemia: Secondary | ICD-10-CM

## 2013-07-20 DIAGNOSIS — E871 Hypo-osmolality and hyponatremia: Secondary | ICD-10-CM | POA: Diagnosis not present

## 2013-07-20 DIAGNOSIS — M2500 Hemarthrosis, unspecified joint: Secondary | ICD-10-CM

## 2013-07-20 DIAGNOSIS — IMO0002 Reserved for concepts with insufficient information to code with codable children: Secondary | ICD-10-CM

## 2013-07-20 NOTE — Progress Notes (Signed)
Patient ID: Julian Fowler, male   DOB: 16-Sep-1935, 78 y.o.   MRN: [id number] Facility [facility] Chief complaint; followup hyponatremia hypokalemia and edema of his left leg History; Julian Fowler comes to [place] after having an elective left total knee replacement. He had very significant perioperative bleeding. Had blood clots in the left knee which was aspirated by his orthopedic surgeon. His hemoglobin is currently 9.1 which is down from 9.8 on last measurement.  He has also had difficulties with low sodium. The patient states he was told this when he had his low back surgery roughly 2 months before the current surgery. His sodium is generally running between 128 and 131. This was down to 123 on September 27 the. His chlorthalidone was discontinued both the due to the hyponatremia and fairly significant postoperative hypokalemia. He has gone on to have a left leg duplex ultrasound which was negative.  Review of systems Left leg he is having some discomfort in the left knee however generally the edema in his legs improved. He is up walking does not describe severe pain  Physical examination temperature is 99.1 pulse 70 respirations 20 blood pressure 122/59 Cardiac heart sounds are normal there is no murmurs he appears to be euvolemic to. Left leg there is still clearly fluid on the left knee. This is probably continued hemarthrosis. Edema in his left leg is continuing to improve her pressure stockings or TED hose would probably be beneficial.  Impressions #1 effusion of her left knee there continues to be some swelling and erythema here some tenderness of. He is due to see his surgeon on Thursday however this for his review. I think this is probably benign #2 hyponatremia. I liberalize his salt intake earlier this admission. They sodium appears to be coming up currently of 128. His chlorthalidone is on hold. As long as this continues to improve I won't have an issue. He also states he has a lot of water  intake which is a lifelong habit although this would be an unusual cause of low sodium #3 hypokalemia visit sodium today is 3.8. I'll continue to follow this with a chlorthalidone on hold. #4 hypertension on chlorthalidone for a blood pressure is 122/59. Certainly doesn't need this currently.

## 2013-07-20 NOTE — Progress Notes (Signed)
Patient ID: Julian Fowler, male   DOB: [birth year], 78 y.o.   MRN: [id number]           PROGRESS NOTE  DATE:  07/15/2013  FACILITY: [facility]    LEVEL OF CARE:   SNF   Acute Visit   CHIEF COMPLAINT:  Review of swelling in his knee, hyponatremia.    HISTORY OF PRESENT ILLNESS:  [name] is a gentleman who came here after undergoing an elective left total knee replacement.  He had perioperative bleeding and a fair amount of blood loss into the leg, also probably some in his knee.  He followed up with his surgeon yesterday, who is Dr. Lequita Halt.   He apparently had blood clots aspirated.  This is not surprising.      Also of note, he had a sodium of 125 and a potassium of 4.2 the other day.  His lowest potassium was 2.7.  This was felt to be all secondary to his chlorthalidone that he was on preoperatively for hypertension.  I have followed up on his lab work today.    His hemoglobin is 9.1, which is down from 9.8.   His sodium is up to 129 from 123 on September 27th.  His potassium is 5.  He is still on KCl 20 mEq.      PHYSICAL EXAMINATION:   MUSCULOSKELETAL:   EXTREMITIES:   LEFT LOWER EXTREMITY:   Left knee:  There is still swelling and erythema here.  I am uncertain whether any of the fluid that Dr. Lequita Halt obtained was sent for culture or not.    Nevertheless, this is probably all hemarthrosis secondary to surgery.  The left leg looks fairly good.    ASSESSMENT/PLAN:  Hyponatremia.  Likely secondary to diuretics.  This is correcting.  I will recheck this on Monday.    Significant hypokalemia.  Probably also secondary to his chlorthalidone.  As hypokalemia of 2.7 reflects significant total body potassium depletion, I will recheck this, although I think his KCl can stop as of today (potassium is 5).    Postoperative anemia.  His hemoglobin is 9.1.  This reflects a significant drop.  I will recheck a CBC next Monday, as well.    CPT CODE: 30865

## 2013-07-29 ENCOUNTER — Non-Acute Institutional Stay (SKILLED_NURSING_FACILITY): Payer: Medicare Other | Admitting: Internal Medicine

## 2013-07-29 DIAGNOSIS — E871 Hypo-osmolality and hyponatremia: Secondary | ICD-10-CM

## 2013-07-29 DIAGNOSIS — IMO0002 Reserved for concepts with insufficient information to code with codable children: Secondary | ICD-10-CM

## 2013-07-29 DIAGNOSIS — E876 Hypokalemia: Secondary | ICD-10-CM

## 2013-07-29 DIAGNOSIS — M2500 Hemarthrosis, unspecified joint: Secondary | ICD-10-CM

## 2013-07-29 NOTE — Progress Notes (Signed)
Patient ID: Julian Fowler, male   DOB: 1935/03/17, 78 y.o.   MRN: [id number] [facility] Chief complaint followup blood pressure electrolytes. Pre discharge Review History Julian Fowler is a gentleman came to [place] after an elective left total knee replacement. He had perioperative bleeding including intra-articular bleeding. Nevertheless he has done well with swelling as come down his knee feels better and is doing well in rehabilitation. With regards to other issues he had a low sodium and low potassium. His chlorthalidone had been discontinued in the hospital to both of due to both of these issues. I have not felt pressed to restart this. His blood pressure is still within the 120s to 130s systolic and mostly in the 60s diastolic. Last lab work was from October 13 showed a white count of 5 hemoglobin of 9.8. His sodium was 133 and potassium 3.6 so. I will check this before he leaves which is either going to be on Friday or Saturday. He was put on a full-strength adult aspirin for DVT prophylaxis due to be large amount of blood loss into the knee and leg. I don't think he will need this when he goes home.  Physical examination Left knee I think there is still some effusion on the left knee which is probably resolving hematoma. Other than that his leg looks fine. Cardiac blood pressure as noted above this seems to be under good control.  Impression/plan #1 status post left total knee replacement. He continues to have a moderate effusion which I think is still resolving hematoma. I don't believe there is any evidence of infection here currently. The patient is rehabilitating well. I don't think he needs to be discharged on his aspirin he is already up ambulating #2 hyponatremia secondary to chlorthalidone. I will recheck this before he leaves as well as potassium. #3 hypertension. I do not feel pressure to put him back on an antidepressant. His blood pressure is controlled currently although  it sometimes takes 2-3 weeks after stopping the persons blood pressure medication to see the blood pressure returned to the hypertensive range and I have explained this to him. However for the moment I don't think anything needs to be arranged at discharge.  The patient will go to outpatient rehabilitation in [place]. He does not require any DME

## 2013-08-04 DIAGNOSIS — R262 Difficulty in walking, not elsewhere classified: Secondary | ICD-10-CM | POA: Diagnosis not present

## 2013-08-04 DIAGNOSIS — M1710 Unilateral primary osteoarthritis, unspecified knee: Secondary | ICD-10-CM | POA: Diagnosis not present

## 2013-08-06 DIAGNOSIS — R262 Difficulty in walking, not elsewhere classified: Secondary | ICD-10-CM | POA: Diagnosis not present

## 2013-08-06 DIAGNOSIS — M1710 Unilateral primary osteoarthritis, unspecified knee: Secondary | ICD-10-CM | POA: Diagnosis not present

## 2013-08-10 ENCOUNTER — Other Ambulatory Visit: Payer: Self-pay | Admitting: Internal Medicine

## 2013-08-10 ENCOUNTER — Other Ambulatory Visit: Payer: Self-pay | Admitting: *Deleted

## 2013-08-10 MED ORDER — LOSARTAN POTASSIUM 50 MG PO TABS: 50.0000 mg | ORAL_TABLET | Freq: Every morning | ORAL | Status: DC

## 2013-08-10 NOTE — Telephone Encounter (Signed)
Ok refill total 6 months 

## 2013-08-10 NOTE — Telephone Encounter (Signed)
CY-please advise if okay to refill. Thanks.  

## 2013-08-11 DIAGNOSIS — R262 Difficulty in walking, not elsewhere classified: Secondary | ICD-10-CM | POA: Diagnosis not present

## 2013-08-11 DIAGNOSIS — M1710 Unilateral primary osteoarthritis, unspecified knee: Secondary | ICD-10-CM | POA: Diagnosis not present

## 2013-08-12 DIAGNOSIS — E782 Mixed hyperlipidemia: Secondary | ICD-10-CM | POA: Diagnosis not present

## 2013-08-12 DIAGNOSIS — Z96659 Presence of unspecified artificial knee joint: Secondary | ICD-10-CM | POA: Diagnosis not present

## 2013-08-12 DIAGNOSIS — E871 Hypo-osmolality and hyponatremia: Secondary | ICD-10-CM | POA: Diagnosis not present

## 2013-08-12 DIAGNOSIS — M1990 Unspecified osteoarthritis, unspecified site: Secondary | ICD-10-CM | POA: Diagnosis not present

## 2013-08-12 DIAGNOSIS — R609 Edema, unspecified: Secondary | ICD-10-CM | POA: Diagnosis not present

## 2013-08-12 DIAGNOSIS — I10 Essential (primary) hypertension: Secondary | ICD-10-CM | POA: Diagnosis not present

## 2013-08-12 DIAGNOSIS — R7301 Impaired fasting glucose: Secondary | ICD-10-CM | POA: Diagnosis not present

## 2013-08-13 DIAGNOSIS — M7989 Other specified soft tissue disorders: Secondary | ICD-10-CM | POA: Diagnosis not present

## 2013-08-14 NOTE — Telephone Encounter (Signed)
Called refill to pharmacy voicemail.  

## 2013-08-18 DIAGNOSIS — R262 Difficulty in walking, not elsewhere classified: Secondary | ICD-10-CM | POA: Diagnosis not present

## 2013-08-18 DIAGNOSIS — M1710 Unilateral primary osteoarthritis, unspecified knee: Secondary | ICD-10-CM | POA: Diagnosis not present

## 2013-08-19 DIAGNOSIS — R262 Difficulty in walking, not elsewhere classified: Secondary | ICD-10-CM | POA: Diagnosis not present

## 2013-08-19 DIAGNOSIS — M1710 Unilateral primary osteoarthritis, unspecified knee: Secondary | ICD-10-CM | POA: Diagnosis not present

## 2013-08-24 DIAGNOSIS — Z96659 Presence of unspecified artificial knee joint: Secondary | ICD-10-CM | POA: Diagnosis not present

## 2013-08-24 DIAGNOSIS — IMO0002 Reserved for concepts with insufficient information to code with codable children: Secondary | ICD-10-CM | POA: Diagnosis not present

## 2013-08-24 DIAGNOSIS — M1710 Unilateral primary osteoarthritis, unspecified knee: Secondary | ICD-10-CM | POA: Diagnosis not present

## 2013-09-08 DIAGNOSIS — E871 Hypo-osmolality and hyponatremia: Secondary | ICD-10-CM | POA: Diagnosis not present

## 2013-09-08 DIAGNOSIS — E782 Mixed hyperlipidemia: Secondary | ICD-10-CM | POA: Diagnosis not present

## 2013-09-08 DIAGNOSIS — N400 Enlarged prostate without lower urinary tract symptoms: Secondary | ICD-10-CM | POA: Diagnosis not present

## 2013-09-08 DIAGNOSIS — I10 Essential (primary) hypertension: Secondary | ICD-10-CM | POA: Diagnosis not present

## 2013-09-11 ENCOUNTER — Other Ambulatory Visit: Payer: Self-pay | Admitting: Cardiology

## 2013-09-14 ENCOUNTER — Other Ambulatory Visit: Payer: Self-pay | Admitting: Cardiology

## 2013-09-14 MED ORDER — POTASSIUM CHLORIDE CRYS ER 20 MEQ PO TBCR: 20.0000 meq | EXTENDED_RELEASE_TABLET | Freq: Every day | ORAL | Status: DC

## 2013-09-17 DIAGNOSIS — E871 Hypo-osmolality and hyponatremia: Secondary | ICD-10-CM | POA: Diagnosis not present

## 2013-09-17 DIAGNOSIS — Z0000 Encounter for general adult medical examination without abnormal findings: Secondary | ICD-10-CM | POA: Diagnosis not present

## 2013-09-17 DIAGNOSIS — I10 Essential (primary) hypertension: Secondary | ICD-10-CM | POA: Diagnosis not present

## 2013-09-17 DIAGNOSIS — Z1331 Encounter for screening for depression: Secondary | ICD-10-CM | POA: Diagnosis not present

## 2013-09-17 DIAGNOSIS — N400 Enlarged prostate without lower urinary tract symptoms: Secondary | ICD-10-CM | POA: Diagnosis not present

## 2013-09-17 DIAGNOSIS — E782 Mixed hyperlipidemia: Secondary | ICD-10-CM | POA: Diagnosis not present

## 2013-09-17 DIAGNOSIS — M1990 Unspecified osteoarthritis, unspecified site: Secondary | ICD-10-CM | POA: Diagnosis not present

## 2013-09-17 DIAGNOSIS — Z96659 Presence of unspecified artificial knee joint: Secondary | ICD-10-CM | POA: Diagnosis not present

## 2013-09-24 ENCOUNTER — Ambulatory Visit (INDEPENDENT_AMBULATORY_CARE_PROVIDER_SITE_OTHER)
Admission: RE | Admit: 2013-09-24 | Discharge: 2013-09-24 | Disposition: A | Discharge status: Home / Self Care | Payer: Medicare Other | Source: Ambulatory Visit | Attending: Internal Medicine | Admitting: Internal Medicine

## 2013-09-24 ENCOUNTER — Ambulatory Visit (INDEPENDENT_AMBULATORY_CARE_PROVIDER_SITE_OTHER): Payer: Medicare Other | Admitting: Internal Medicine

## 2013-09-24 ENCOUNTER — Encounter: Payer: Self-pay | Admitting: Internal Medicine

## 2013-09-24 VITALS — BP 160/80 | HR 60 | Ht 69.0 in | Wt 213.6 lb

## 2013-09-24 DIAGNOSIS — Z77090 Contact with and (suspected) exposure to asbestos: Secondary | ICD-10-CM

## 2013-09-24 DIAGNOSIS — G4733 Obstructive sleep apnea (adult) (pediatric): Secondary | ICD-10-CM

## 2013-09-24 DIAGNOSIS — R609 Edema, unspecified: Secondary | ICD-10-CM

## 2013-09-24 DIAGNOSIS — R091 Pleurisy: Secondary | ICD-10-CM | POA: Diagnosis not present

## 2013-09-24 NOTE — Patient Instructions (Signed)
Order- CXR  Dx asbestos exposure  Please call as needed

## 2013-09-24 NOTE — Progress Notes (Signed)
07/26/11- 78 year old male former smoker followed for allergic rhinitis, asthma, COPD, obstructive sleep apnea, history of asbestos exposure/plaques. Last here- 07/25/2010  Has had flu vaccine. He had been swimming regularly and had kept his weight down. Had surgery on his hand, that hurt his shoulder and has not been swimming is much. Said previously he could swim the length of the pool underwater. Needing lorazepam still to sleep, especially for sleep onset. Since he lost weight he has not needed CPAP. For the last week and a half has had pain across his upper mid back if he stands up straight. This is not affected by lying supine or by coughing.  09/23/12- 78 year old male former smoker followed for allergic rhinitis, asthma, COPD, obstructive sleep apnea, history of asbestos exposure/plaques. 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 Partially managed with interventional radiology placing coils. Gamma knife radiation therapy at [facility] did not help. Still swimming regularly. Denies cough, chest pain, shortness of breath. Insomnia has been more troublesome this year, partly because of stress from the above issues. Alfonso Patten it just made him groggy. Combination of Ambien 5 mg plus lorazepam 1 mg helps fairly well. Still wakes during the night. We discussed potential that he could tolerate lorazepam 2 mg. He denies nighttime confusion, sleepwalking or morning hangover. He has not recognized sleep apnea as an issue since he lost weight years ago  09/24/13- 78 year old male former smoker followed for allergic rhinitis, asthma, COPD, hx obstructive sleep apnea, history of asbestos exposure/plaques. Chronic insomnia FOLLOWS FOR: denies any trouble with  breathing or sleep-continues to use sleeping meds. Has had back surgery and total knee replacement since last here. Left calf now swells-checked twice for DVT and maintained on diuretic. Continues antihistamines for rhinitis. Sleeping well with medication. CXR 09/23/12 IMPRESSION:  Stable bilateral pleural plaques consistent with a history of  previous asbestos exposure.  No acute cardiopulmonary disease. No change from prior study.  Original Report Authenticated By: Amie Portland, M.D.  ROS See HPI Constitutional:   No-   weight loss, night sweats, fevers, chills, fatigue, lassitude. HEENT:   + headaches,  No -difficulty swallowing, tooth/dental problems, sore throat,       No-  sneezing, itching, ear ache, nasal congestion, post nasal drip,  CV:  No-   chest pain, orthopnea, PND, +swelling in lower extremities, no-anasarca, dizziness, palpitations Resp: No-   shortness of breath with exertion or at rest.              No-   productive cough,  No non-productive cough,  No-  coughing up of blood.              No-   change in color of mucus.  No- wheezing.   Skin: No-   rash or lesions. GI:  No-   heartburn, indigestion, abdominal pain, nausea, vomiting,              GU:  MS:  + joint pain or swelling. .  + back pain. Neuro-  nothing unusual except per  HPI Psych:  No- change in mood or affect. No depression or anxiety.  No memory loss.  OBJ General- Alert, Oriented, Affect-appropriate, Distress- none acute Skin- rash-none, lesions- none, excoriation- none,  Lymphadenopathy- none Head- atraumatic  Eyes- Gross vision intact, PERRLA, conjunctivae clear secretions            Ears- +hard of hearing            Nose- Clear, no-Septal dev, mucus, polyps, erosion, perforation             Throat- Mallampati II , mucosa clear , drainage- none, tonsils- atrophic. Dental repair. Neck- flexible , trachea midline, no stridor , thyroid nl, carotid no bruit Chest - symmetrical excursion ,  unlabored           Heart/CV- slow RRR , no murmur , no gallop  , no rub, nl s1 s2                           - JVD- none , edema- none, stasis changes- none, varices- none           Lung- clear to P&A, wheeze- none, cough- none , dullness-none, rub- none. I cannot hear fine                     crackles or rub.           Chest wall-  Abd-  Br/ Gen/ Rectal- Not done, not indicated Extrem- +edema and erythema left calf/ankle, Neg Homan's Neuro- grossly intact to observation. Speech is clear.

## 2013-09-25 NOTE — Progress Notes (Signed)
Quick Note:  Pt aware of results. ______ 

## 2013-09-30 DIAGNOSIS — M4802 Spinal stenosis, cervical region: Secondary | ICD-10-CM | POA: Diagnosis not present

## 2013-09-30 DIAGNOSIS — M5126 Other intervertebral disc displacement, lumbar region: Secondary | ICD-10-CM | POA: Diagnosis not present

## 2013-10-15 DIAGNOSIS — R609 Edema, unspecified: Secondary | ICD-10-CM | POA: Insufficient documentation

## 2013-10-15 NOTE — Assessment & Plan Note (Signed)
Unable to swim regularly after his surgeries and starting to regain a little weight. We discussed symptoms of sleep apnea to watch for.

## 2013-10-15 NOTE — Assessment & Plan Note (Signed)
He is being followed for this elsewhere and reports Doppler x2 negative. This looks like peripheral venous insufficiency with some stasis dermatitis. I recommended elevation and use of an elastic stocking. He can discuss this further with his managing physician.

## 2013-10-15 NOTE — Assessment & Plan Note (Signed)
Continued surveillance Plan-chest x-ray

## 2013-10-20 DIAGNOSIS — H4011X0 Primary open-angle glaucoma, stage unspecified: Secondary | ICD-10-CM | POA: Diagnosis not present

## 2013-10-20 DIAGNOSIS — H409 Unspecified glaucoma: Secondary | ICD-10-CM | POA: Diagnosis not present

## 2013-11-17 DIAGNOSIS — R141 Gas pain: Secondary | ICD-10-CM | POA: Diagnosis not present

## 2013-11-17 DIAGNOSIS — R143 Flatulence: Secondary | ICD-10-CM | POA: Diagnosis not present

## 2013-12-14 ENCOUNTER — Other Ambulatory Visit: Payer: Self-pay | Admitting: Cardiology

## 2013-12-14 MED ORDER — DILTIAZEM HCL ER COATED BEADS 120 MG PO CP24: 120.0000 mg | ORAL_CAPSULE | Freq: Two times a day (BID) | ORAL | Status: DC

## 2013-12-15 DIAGNOSIS — L570 Actinic keratosis: Secondary | ICD-10-CM | POA: Diagnosis not present

## 2013-12-15 DIAGNOSIS — L259 Unspecified contact dermatitis, unspecified cause: Secondary | ICD-10-CM | POA: Diagnosis not present

## 2013-12-24 ENCOUNTER — Other Ambulatory Visit: Payer: Self-pay | Admitting: Orthopedic Surgery

## 2013-12-26 DIAGNOSIS — M1990 Unspecified osteoarthritis, unspecified site: Secondary | ICD-10-CM | POA: Diagnosis not present

## 2013-12-26 DIAGNOSIS — R7301 Impaired fasting glucose: Secondary | ICD-10-CM | POA: Diagnosis not present

## 2013-12-26 DIAGNOSIS — J019 Acute sinusitis, unspecified: Secondary | ICD-10-CM | POA: Diagnosis not present

## 2013-12-31 ENCOUNTER — Other Ambulatory Visit: Payer: Self-pay | Admitting: Orthopedic Surgery

## 2013-12-31 NOTE — H&P (Signed)
Julian Fowler DOB: October 14, 1935 Single / Language: Cleophus Molt / Race: White Male  Date of Admission:  01-18-2014  Chief Complaint:  Right Knee Pain  History of Present Illness The patient is a 79 year old male who comes in for a preoperative History and Physical. The patient is scheduled for a right total knee arthroplasty to be performed by Dr. Dione Plover. Aluisio, MD at [facility] on 01-18-2014. The patient is a 79 year old male presenting for his knee pain. The patient comes in several months out from left total knee arthroplasty. The patient states that he is doing well (mainly notes a lot swelling and redness) at this time. They are currently on no medication for their pain. The patient is currently doing home exercise program (swimming/water aerobics). The opposite, right, knee continues to be a problem. He has popping and cracking. The pain has increased especially since the left knee is now doing so well. He has improved greatly with the left knee replacement and is now felt he would benefit from getting the other knee done at this time. He is ready to proceed with the right knee surgery. They have been treated conservatively in the past for the above stated problem and despite conservative measures, they continue to have progressive pain and severe functional limitations and dysfunction. They have failed non-operative management including home exercise, medications, and injections. It is felt that they would benefit from undergoing total joint replacement. Risks and benefits of the procedure have been discussed with the patient and they elect to proceed with surgery. There are no active contraindications to surgery such as ongoing infection or rapidly progressive neurological disease.  Allergies Adhesive Tape. 04/10/2012 Other (See Comments). User imported and matched from unrecognized allergy [Tape] Codeine Derivatives Codeine Sulfate *ANALGESICS -  OPIOID*. 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 Does not use CPAP Hypertension History of Acoustic Neuroma. Surgery 1995 Shingles Hiatal Hernia High blood pressure Gastroesophageal Reflux Disease Impaired Hearing. 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 reported Chronic Obstructive Lung Disease. sister Congestive Heart Failure. mother Hypertension. Mother, Brother. mother and brother Cancer. mother and father Drug / Alcohol Addiction. father    Social History Exercise. Exercises daily; does individual sport Tobacco use. Former smoker. former smoker; smoke(d) 1 pack(s) per day Drug/Alcohol Rehab (Previously). no Number of flights of stairs before winded. greater than 5 Marital status. divorced Post-Surgical Plans. Plan is to go home this time following surgery. Children. 2 Alcohol use. former drinker Illicit drug use. no Tobacco / smoke exposure. no Current work status. retired Engineer, agricultural (Currently). no Former smoker Pain Contract. no   Medication History Tylenol (325MG  Tablet, Oral) Active. Calcium Carbonate ( Oral) Specific dose unknown - Active. Centrum Silver ( Oral) Active. Aleve (220MG  Tablet, Oral) Active. PriLOSEC (20MG  Capsule DR, Oral) Active. Vitamin A (8000UNIT Capsule, Oral) Active. AmLODIPine Besylate (2.5MG  Tablet, Oral) Active. Fexofenadine HCl (180MG  Tablet, Oral) Active. Zolpidem Tartrate (10MG  Tablet, Oral) Active. LORazepam (1MG  Tablet, Oral) Active. Diltiazem HCl ER Coated Beads (120MG  Capsule ER 24HR, Oral) Active. Losartan Potassium (100MG  Tablet, Oral) Active. Potassium Chloride Crys ER (20MEQ Tablet ER, 20 mEq Oral, Taken starting 07/08/2013) Active. Furosemide (40MG   Tablet, Oral) Active. Pravastatin Sodium (20MG  Tablet, Oral) Active. Latanoprost (0.005% Solution, Ophthalmic) Active. Medications Reconciled.    Past Surgical History  Vasectomy Gallbladder Surgery. open Total Knee Replacement - Left   Review of Systems General:Not Present- Chills, Fever, Night Sweats, Fatigue, Weight Gain, Weight Loss and Memory Loss. Skin:Not Present- Hives, Itching, Rash, Eczema and Lesions. HEENT:Present- Hearing problems. Not Present- Tinnitus, Headache, Double Vision, Visual Loss, Hearing Loss and Dentures. Respiratory:Not Present- Shortness of breath with exertion, Shortness of breath at rest, Allergies, Coughing up blood and Chronic Cough. Cardiovascular:Not Present- Chest Pain, Racing/skipping heartbeats, Difficulty Breathing Lying Down, Murmur, Swelling and Palpitations. Gastrointestinal:Not Present- Bloody Stool, Heartburn, Abdominal Pain, Vomiting, Nausea, Constipation, Diarrhea, Difficulty Swallowing, Jaundice and Loss of appetitie. Male Genitourinary:Present- Urinary frequency and Urinating at Night. Not Present- Blood in Urine, Weak urinary stream, Discharge, Flank Pain, Incontinence, Painful Urination, Urgency and Urinary Retention. Musculoskeletal:Present- Joint Pain and Back Pain. Not Present- Muscle Weakness, Muscle Pain, Joint Swelling, Morning Stiffness and Spasms. Neurological:Not Present- Tremor, Dizziness, Blackout spells, Paralysis, Difficulty with balance and Weakness. Psychiatric:Not Present- Insomnia.    Vitals Pulse: 58 (Regular) Resp.: 16 (Unlabored) BP: 146/72 (Sitting, Right Arm, Standard)     Physical Exam The physical exam findings are as follows:  Note: Patient is a 79 year old male with continued right knee pain. Patient is accompanied today by his wife on exam.   General Mental Status - Alert, cooperative and good historian. General Appearance- pleasant. Not in acute distress. Orientation-  Oriented X3. Build & Nutrition- Well nourished and Well developed.   Head and Neck Head- normocephalic, atraumatic . Neck Global Assessment- supple. no bruit auscultated on the right and no bruit auscultated on the left. Note: upper and lower dentures  Eye Vision- Wears corrective lenses. Pupil- Bilateral- Regular and Round. Motion- Bilateral- EOMI.   Chest and Lung Exam Auscultation: Breath sounds:- clear at anterior chest wall and - clear at posterior chest wall. Adventitious sounds:- No Adventitious sounds.   Cardiovascular Auscultation:Rhythm- Regular rate and rhythm. Heart Sounds- S1 WNL and S2 WNL. Murmurs & Other Heart Sounds: Murmur 1:Location- Aortic Area. Timing- Mid-systolic. Grade- III/VI. Character- Blowing.   Abdomen Palpation/Percussion:Tenderness- Abdomen is non-tender to palpation. Rigidity (guarding)- Abdomen is soft. Auscultation:Auscultation of the abdomen reveals - Bowel sounds normal.   Male Genitourinary Not done, not pertinent to present illness  Musculoskeletal  On exam he is alert and oriented in no apparent distress. His left knee looks much better. There is no swelling in the knee. His ankle is swollen, but it looks better than it did previously. Range of motion of the knee is 0 to 128 degrees with no instability. His right knee shows no effusion. There is verus deformity. Range of motion of the right knee is 5 to 125 with tenderness medial greater than lateral with no instability.  RADIOGRAPHS: Previous x rays of his right knee from earlier this year and he had bone on bone arthritis medial and patellofemoral.   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 Wynelle Link.  Plan is to go home.  PCP - Dr. Dalia Heading - [facility] - Dr.  Ron Parker  The patient does not have any contraindications and will receive TXA (tranexamic acid) prior to surgery.  Signed electronically by Joelene Millin, III PA-C

## 2014-01-01 ENCOUNTER — Ambulatory Visit (INDEPENDENT_AMBULATORY_CARE_PROVIDER_SITE_OTHER): Payer: Medicare Other | Admitting: Cardiology

## 2014-01-01 ENCOUNTER — Encounter: Payer: Self-pay | Admitting: Cardiology

## 2014-01-01 VITALS — BP 165/78 | HR 58 | Ht 69.5 in | Wt 210.0 lb

## 2014-01-01 DIAGNOSIS — Z01810 Encounter for preprocedural cardiovascular examination: Secondary | ICD-10-CM | POA: Diagnosis not present

## 2014-01-01 DIAGNOSIS — R0789 Other chest pain: Secondary | ICD-10-CM

## 2014-01-01 DIAGNOSIS — I498 Other specified cardiac arrhythmias: Secondary | ICD-10-CM

## 2014-01-01 DIAGNOSIS — I471 Supraventricular tachycardia: Secondary | ICD-10-CM

## 2014-01-01 MED ORDER — DILTIAZEM HCL ER COATED BEADS 120 MG PO CP24: 120.0000 mg | ORAL_CAPSULE | Freq: Two times a day (BID) | ORAL | Status: DC

## 2014-01-01 NOTE — Progress Notes (Signed)
Patient ID: Julian Fowler, male   DOB: 1935/06/11, 79 y.o.   MRN: [id number]    HPI  Patient is seen today for clearance for knee surgery. I had seen him last in September, 2014. He was actually doing well. He was cleared for knee surgery at that time. It was done successfully. There is now a plan to do his other knee.  He is not having any significant symptoms. He is swimming on a regular basis. His blood pressure has been under reasonable control. It is mildly elevated in the office today.  Allergies  Allergen Reactions  . Codeine     REACTION: groggy  . Tape Other (See Comments)    Skin tears    Current Outpatient Prescriptions  Medication Sig Dispense Refill  . acetaminophen (TYLENOL) 325 MG tablet Take 325 mg by mouth every 6 (six) hours as needed for pain.       Marland Kitchen amLODipine (NORVASC) 2.5 MG tablet Take 2.5 mg by mouth every morning.      . calcium carbonate (OS-CAL) 600 MG TABS tablet Take 600 mg by mouth 2 (two) times daily with a meal.      . diltiazem (CARDIZEM CD) 120 MG 24 hr capsule Take 1 capsule (120 mg total) by mouth 2 (two) times daily.  60 capsule  6  . fexofenadine (ALLEGRA) 180 MG tablet Take 180 mg by mouth daily as needed. For allergies      . furosemide (LASIX) 40 MG tablet Take 1 tablet by mouth daily.      Marland Kitchen latanoprost (XALATAN) 0.005 % ophthalmic solution Place 1 drop into both eyes at bedtime.       Marland Kitchen LORazepam (ATIVAN) 1 MG tablet TAKE ONE TO TWO TABLETS BY MOUTH AT BEDTIME AS NEEDED  180 tablet  0  . losartan (COZAAR) 100 MG tablet Take 100 mg by mouth daily.      . Multiple Vitamin (MULTIVITAMIN) tablet Take 1 tablet by mouth daily.      . naproxen sodium (ANAPROX) 220 MG tablet Take 220 mg by mouth as needed.      Marland Kitchen omeprazole (PRILOSEC) 20 MG capsule Take 20 mg by mouth daily. Take 1 tablet once daily thirty minutes before a meal      . potassium chloride SA (K-DUR,KLOR-CON) 20 MEQ tablet Take 1 tablet (20 mEq total) by mouth daily.  90 tablet  3  .  pravastatin (PRAVACHOL) 20 MG tablet Take 20 mg by mouth at bedtime.        Marland Kitchen zolpidem (AMBIEN) 10 MG tablet Take 1 tablet by mouth at bedtime.       No current facility-administered medications for this visit.    History   Social History  . Marital Status: Divorced    Spouse Name: N/A    Number of Children: N/A  . Years of Education: N/A   Occupational History  . Not on file.   Social History Main Topics  . Smoking status: Former Smoker -- 1.50 packs/day for 30 years    Types: Cigarettes    Quit date: 10/15/1986  . Smokeless tobacco: Never Used  . Alcohol Use: No     Comment: Used to drink heavily at times  . Drug Use: No  . Sexual Activity: Not on file   Other Topics Concern  . Not on file   Social History Narrative   Co-dependent relationship with his 38 year old son who has drug and financial problems    Family History  Problem Relation Age of Onset  . Cancer Father     oral cancer  . Breast cancer Mother   . Heart attack Mother   . Hypertension Sister     Bypass x4  . Alcohol abuse Sister   . Alzheimer's disease Sister   . Kidney disease Sister     Past Medical History  Diagnosis Date  . Anxiety     codependent relationship with irresponsible son  . Depression   . GERD (gastroesophageal reflux disease)   . Hyperlipidemia   . Hypertension December 29, 2009    renal artery Doppler , normal  12/2009  . Abdominal aorta injury     Normal size, ultrasound, March 17,2011  . Asbestos exposure     Hx of asbestos related pleural plaques  . Overactive bladder   . ED (erectile dysfunction)   . Arthritis   . Hearing loss in left ear   . Baker's cyst     Left leg  . SVT (supraventricular tachycardia)     AVNRT, AV nodal reentry tachycardia  . Allergic rhinitis   . Syncope December 2010    09/2009  felt to be vasovagal or micturition syncope  . Chest pain     nuclear, 10/2007, normal  . Ejection fraction     60%, echo, 2008, mild RV dysfunction  . Easy  bruisability   . H/O sleep apnea     resolved with weight loss  . BPH (benign prostatic hyperplasia)   . OSA (obstructive sleep apnea)     moderate -NPSG 03/02/08 AHI 23 cpap 8..06/30/13 PT DENIES SLEEP APNEA OR CPAP MACHINE    Past Surgical History  Procedure Laterality Date  . Cholecystectomy  6/98  . Craniectomy for excision of acoustic neuroma  3/95  . Trigger finger release  2003    (thumb) middle finger (2006)  . Cerebral embolization  12/2011  . Brain surgery    . Back surgery      herniated L1, L2   Dr Carloyn Manner  . Total knee arthroplasty Left 07/06/2013    Procedure: LEFT TOTAL KNEE ARTHROPLASTY;  Surgeon: Gearlean Alf, MD;  Location: [facility];  Service: Orthopedics;  Laterality: Left;    Patient Active Problem List   Diagnosis Date Noted  . Peripheral edema 10/15/2013  . Hyponatremia 07/07/2013  . Hypokalemia 07/07/2013  . OA (osteoarthritis) of knee 07/06/2013  . Preop cardiovascular exam 06/26/2013  . Chronic insomnia 09/29/2012  . Cardiac murmur 08/27/2012  . Hearing impaired 04/10/2012  . Easy bruisability   . Anxiety   . GERD (gastroesophageal reflux disease)   . Hyperlipidemia   . Abdominal aorta injury   . Asbestos exposure   . Overactive bladder   . ED (erectile dysfunction)   . SVT (supraventricular tachycardia)   . OSA (obstructive sleep apnea)   . Chest tightness   . Ejection fraction   . Hypertension 12/29/2009  . Syncope 09/14/2009  . COPD 07/15/2009  . TOBACCO ABUSE, HX OF 07/15/2009  . BACK PAIN 06/22/2009  . INTESTINAL GAS 06/22/2009  . 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 ALLERGIC RHINITIS 01/15/2008  . ECZEMA 11/18/2007  . ASTHMA, MILD 11/03/2007  . GERD 04/14/2007  . OVERACTIVE BLADDER 04/14/2007  . ARTHRITIS 04/14/2007  . KNEE PAIN, LEFT 04/14/2007    ROS   Patient denies fever, chills, headache, sweats, rash, change in vision, change in hearing, chest pain, cough, nausea or vomiting, urinary symptoms. All other  systems are reviewed  and are negative.  PHYSICAL EXAM  Today he is here with his fiance. There being married tomorrow. He is oriented to person time and place. Affect is normal. There is no jugulovenous distention. Lungs are clear. Respiratory effort is nonlabored. Cardiac exam reveals S1 and S2. There no clicks or significant murmurs. Abdomen is soft. There is no peripheral edema. There no musculoskeletal deformities. There are no skin rashes.  Filed Vitals:   01/01/14 1420  BP: 165/78  Pulse: 58  Height: 5' 9.5" (1.765 m)  Weight: 210 lb (95.255 kg)  SpO2: 97%     ASSESSMENT & PLAN

## 2014-01-01 NOTE — Assessment & Plan Note (Signed)
He has not had any chest tightness. His nuclear scan most recently showed no significant  ischemia. This was unchanged from the past. No further workup is needed.

## 2014-01-01 NOTE — Assessment & Plan Note (Signed)
I have reviewed all of his prior information. He has good left ventricular function. His most recent nuclear study revealed no significant ischemia. He exercises regularly. He is stable and cleared for knee surgery.

## 2014-01-01 NOTE — Assessment & Plan Note (Addendum)
There is history of AV nodal reentrant tachycardia. He's not had any significant palpitations. Historically he has been on to calcium blockers. He tolerates 120 mg of diltiazem in the long-acting format. This helps with his reentrant tachycardia.

## 2014-01-01 NOTE — Patient Instructions (Signed)

## 2014-01-07 ENCOUNTER — Encounter (HOSPITAL_COMMUNITY): Payer: Self-pay | Admitting: Pharmacy Technician

## 2014-01-11 ENCOUNTER — Encounter (HOSPITAL_COMMUNITY): Payer: Self-pay

## 2014-01-11 ENCOUNTER — Encounter (HOSPITAL_COMMUNITY)
Admission: RE | Admit: 2014-01-11 | Discharge: 2014-01-11 | Disposition: A | Discharge status: Home / Self Care | Payer: Medicare Other | Source: Ambulatory Visit | Attending: Orthopedic Surgery | Admitting: Orthopedic Surgery

## 2014-01-11 DIAGNOSIS — Z01812 Encounter for preprocedural laboratory examination: Secondary | ICD-10-CM | POA: Diagnosis not present

## 2014-01-11 HISTORY — DX: Tinnitus, unspecified ear: H93.19

## 2014-01-11 HISTORY — DX: Personal history of other diseases of the digestive system: Z87.19

## 2014-01-11 HISTORY — DX: Other abnormalities of gait and mobility: R26.89

## 2014-01-11 HISTORY — DX: Personal history of other infectious and parasitic diseases: Z86.19

## 2014-01-11 LAB — CBC
HEMATOCRIT: 39.6 % (ref 39.0–52.0)
Hemoglobin: 13.4 g/dL (ref 13.0–17.0)
MCH: 28.5 pg (ref 26.0–34.0)
MCHC: 33.8 g/dL (ref 30.0–36.0)
MCV: 84.1 fL (ref 78.0–100.0)
Platelets: 350 10*3/uL (ref 150–400)
RBC: 4.71 MIL/uL (ref 4.22–5.81)
RDW: 16 % — ABNORMAL HIGH (ref 11.5–15.5)
WBC: 11.3 10*3/uL — ABNORMAL HIGH (ref 4.0–10.5)

## 2014-01-11 LAB — URINALYSIS, ROUTINE W REFLEX MICROSCOPIC
BILIRUBIN URINE: NEGATIVE
Glucose, UA: NEGATIVE mg/dL
Hgb urine dipstick: NEGATIVE
KETONES UR: NEGATIVE mg/dL
Leukocytes, UA: NEGATIVE
Nitrite: NEGATIVE
Protein, ur: NEGATIVE mg/dL
Specific Gravity, Urine: 1.017 (ref 1.005–1.030)
Urobilinogen, UA: 0.2 mg/dL (ref 0.0–1.0)
pH: 6 (ref 5.0–8.0)

## 2014-01-11 LAB — COMPREHENSIVE METABOLIC PANEL
ALT: 17 U/L (ref 0–53)
AST: 22 U/L (ref 0–37)
Albumin: 3.6 g/dL (ref 3.5–5.2)
Alkaline Phosphatase: 97 U/L (ref 39–117)
BILIRUBIN TOTAL: 0.3 mg/dL (ref 0.3–1.2)
BUN: 18 mg/dL (ref 6–23)
CALCIUM: 9.4 mg/dL (ref 8.4–10.5)
CHLORIDE: 96 meq/L (ref 96–112)
CO2: 25 meq/L (ref 19–32)
CREATININE: 0.79 mg/dL (ref 0.50–1.35)
GFR, EST NON AFRICAN AMERICAN: 85 mL/min — AB (ref >90)
Glucose, Bld: 97 mg/dL (ref 70–99)
Potassium: 4.5 mEq/L (ref 3.7–5.3)
Sodium: 134 mEq/L — ABNORMAL LOW (ref 137–147)
Total Protein: 7.5 g/dL (ref 6.0–8.3)

## 2014-01-11 LAB — APTT: aPTT: 31 seconds (ref 24–37)

## 2014-01-11 LAB — PROTIME-INR
INR: 1.1 (ref 0.00–1.49)
PROTHROMBIN TIME: 14 s (ref 11.6–15.2)

## 2014-01-11 LAB — SURGICAL PCR SCREEN
MRSA, PCR: NEGATIVE
Staphylococcus aureus: NEGATIVE

## 2014-01-11 NOTE — Patient Instructions (Addendum)
[place]  01/11/2014   Your procedure is scheduled on: 01/18/14  Report to [facility] at 05:15 AM.  Call this number if you have problems the morning of surgery 336-: [phone]   Remember:   Do not eat food or drink liquids After Midnight.     Take these medicines the morning of surgery with A SIP OF WATER: amlodipine, diltiazem, allegra if needed, prilosec    Do not wear jewelry, make-up or nail polish.  Do not wear lotions, powders, or perfumes. You may wear deodorant.  Do not shave 48 hours prior to surgery. Men may shave face and neck.  Do not bring valuables to the hospital.  Contacts, dentures or bridgework may not be worn into surgery.  Leave suitcase in the car. After surgery it may be brought to your room.  For patients admitted to the hospital, checkout time is 11:00 AM the day of discharge.    Please read over the following fact sheets that you were given:[place] preparing for surgery sheet, MRSA Information, incentive spirometry fact sheet, blood fact sheet Paulette Blanch, RN  pre op nurse call if needed [phone]    FAILURE TO [place]   Patient Signature: ___________________________________________

## 2014-01-11 NOTE — Progress Notes (Signed)
Chest x-ray 09/25/13 on EPIC, EKG 06/26/13 on EPIC

## 2014-01-12 DIAGNOSIS — M1990 Unspecified osteoarthritis, unspecified site: Secondary | ICD-10-CM | POA: Diagnosis not present

## 2014-01-12 DIAGNOSIS — J019 Acute sinusitis, unspecified: Secondary | ICD-10-CM | POA: Diagnosis not present

## 2014-01-12 DIAGNOSIS — R7301 Impaired fasting glucose: Secondary | ICD-10-CM | POA: Diagnosis not present

## 2014-01-18 ENCOUNTER — Encounter (HOSPITAL_COMMUNITY): Payer: Self-pay | Admitting: *Deleted

## 2014-01-18 ENCOUNTER — Inpatient Hospital Stay (HOSPITAL_COMMUNITY)
Admission: RE | Admit: 2014-01-18 | Discharge: 2014-01-20 | DRG: 470 | Disposition: A | Discharge status: Home Health | Payer: Medicare Other | Source: Ambulatory Visit | Attending: Orthopedic Surgery | Admitting: Orthopedic Surgery

## 2014-01-18 ENCOUNTER — Encounter (HOSPITAL_COMMUNITY)
Admission: RE | Disposition: A | Discharge status: Home Health | Payer: Self-pay | Source: Ambulatory Visit | Attending: Orthopedic Surgery

## 2014-01-18 ENCOUNTER — Encounter (HOSPITAL_COMMUNITY): Payer: Medicare Other | Admitting: Certified Registered Nurse Anesthetist

## 2014-01-18 ENCOUNTER — Inpatient Hospital Stay (HOSPITAL_COMMUNITY): Payer: Medicare Other | Admitting: Certified Registered Nurse Anesthetist

## 2014-01-18 DIAGNOSIS — Z79899 Other long term (current) drug therapy: Secondary | ICD-10-CM

## 2014-01-18 DIAGNOSIS — K219 Gastro-esophageal reflux disease without esophagitis: Secondary | ICD-10-CM | POA: Diagnosis present

## 2014-01-18 DIAGNOSIS — Z01812 Encounter for preprocedural laboratory examination: Secondary | ICD-10-CM | POA: Diagnosis not present

## 2014-01-18 DIAGNOSIS — F329 Major depressive disorder, single episode, unspecified: Secondary | ICD-10-CM | POA: Diagnosis present

## 2014-01-18 DIAGNOSIS — F3289 Other specified depressive episodes: Secondary | ICD-10-CM | POA: Diagnosis present

## 2014-01-18 DIAGNOSIS — E785 Hyperlipidemia, unspecified: Secondary | ICD-10-CM | POA: Diagnosis not present

## 2014-01-18 DIAGNOSIS — J449 Chronic obstructive pulmonary disease, unspecified: Secondary | ICD-10-CM | POA: Diagnosis present

## 2014-01-18 DIAGNOSIS — J4489 Other specified chronic obstructive pulmonary disease: Secondary | ICD-10-CM | POA: Diagnosis not present

## 2014-01-18 DIAGNOSIS — Z77090 Contact with and (suspected) exposure to asbestos: Secondary | ICD-10-CM | POA: Diagnosis not present

## 2014-01-18 DIAGNOSIS — Z96659 Presence of unspecified artificial knee joint: Secondary | ICD-10-CM | POA: Diagnosis not present

## 2014-01-18 DIAGNOSIS — F411 Generalized anxiety disorder: Secondary | ICD-10-CM | POA: Diagnosis present

## 2014-01-18 DIAGNOSIS — M179 Osteoarthritis of knee, unspecified: Secondary | ICD-10-CM | POA: Diagnosis present

## 2014-01-18 DIAGNOSIS — H9190 Unspecified hearing loss, unspecified ear: Secondary | ICD-10-CM | POA: Diagnosis present

## 2014-01-18 DIAGNOSIS — Z87891 Personal history of nicotine dependence: Secondary | ICD-10-CM | POA: Diagnosis not present

## 2014-01-18 DIAGNOSIS — N318 Other neuromuscular dysfunction of bladder: Secondary | ICD-10-CM | POA: Diagnosis present

## 2014-01-18 DIAGNOSIS — IMO0002 Reserved for concepts with insufficient information to code with codable children: Secondary | ICD-10-CM | POA: Diagnosis not present

## 2014-01-18 DIAGNOSIS — M1710 Unilateral primary osteoarthritis, unspecified knee: Principal | ICD-10-CM | POA: Diagnosis present

## 2014-01-18 DIAGNOSIS — Z6831 Body mass index (BMI) 31.0-31.9, adult: Secondary | ICD-10-CM

## 2014-01-18 DIAGNOSIS — M25569 Pain in unspecified knee: Secondary | ICD-10-CM | POA: Diagnosis not present

## 2014-01-18 DIAGNOSIS — I10 Essential (primary) hypertension: Secondary | ICD-10-CM | POA: Diagnosis not present

## 2014-01-18 HISTORY — PX: TOTAL KNEE ARTHROPLASTY: SHX125

## 2014-01-18 LAB — TYPE AND SCREEN
ABO/RH(D): A POS
Antibody Screen: NEGATIVE

## 2014-01-18 SURGERY — ARTHROPLASTY, KNEE, TOTAL
Anesthesia: Spinal | Site: Knee | Laterality: Right

## 2014-01-18 MED ORDER — DEXAMETHASONE SODIUM PHOSPHATE 10 MG/ML IJ SOLN
10.0000 mg | Freq: Once | INTRAMUSCULAR | Status: AC
  Administered 2014-01-18: 10 mg via INTRAVENOUS

## 2014-01-18 MED ORDER — ONDANSETRON HCL 4 MG/2ML IJ SOLN: 4.0000 mg | Freq: Four times a day (QID) | INTRAMUSCULAR | Status: DC | PRN

## 2014-01-18 MED ORDER — HYDROMORPHONE HCL 2 MG PO TABS
2.0000 mg | ORAL_TABLET | ORAL | Status: DC | PRN
  Administered 2014-01-19 – 2014-01-20 (×3): 2 mg via ORAL
  Filled 2014-01-18 (×4): qty 1

## 2014-01-18 MED ORDER — BUPIVACAINE HCL (PF) 0.75 % IJ SOLN
INTRAMUSCULAR | Status: DC | PRN
  Administered 2014-01-18: 1.6 mL

## 2014-01-18 MED ORDER — LATANOPROST 0.005 % OP SOLN
1.0000 [drp] | Freq: Every day | OPHTHALMIC | Status: DC
  Administered 2014-01-18 – 2014-01-19 (×2): 1 [drp] via OPHTHALMIC
  Filled 2014-01-18: qty 2.5

## 2014-01-18 MED ORDER — SODIUM CHLORIDE 0.9 % IJ SOLN
INTRAMUSCULAR | Status: DC | PRN
  Administered 2014-01-18: 30 mL

## 2014-01-18 MED ORDER — METHOCARBAMOL 500 MG PO TABS
500.0000 mg | ORAL_TABLET | Freq: Four times a day (QID) | ORAL | Status: DC | PRN
  Administered 2014-01-18 – 2014-01-20 (×5): 500 mg via ORAL
  Filled 2014-01-18 (×5): qty 1

## 2014-01-18 MED ORDER — BISACODYL 10 MG RE SUPP: 10.0000 mg | Freq: Every day | RECTAL | Status: DC | PRN

## 2014-01-18 MED ORDER — KETOROLAC TROMETHAMINE 15 MG/ML IJ SOLN
INTRAMUSCULAR | Status: AC
  Filled 2014-01-18: qty 1

## 2014-01-18 MED ORDER — PHENOL 1.4 % MT LIQD: 1.0000 | OROMUCOSAL | Status: DC | PRN

## 2014-01-18 MED ORDER — LACTATED RINGERS IV SOLN
INTRAVENOUS | Status: DC | PRN
  Administered 2014-01-18: 07:00:00 via INTRAVENOUS

## 2014-01-18 MED ORDER — BUPIVACAINE LIPOSOME 1.3 % IJ SUSP
20.0000 mL | Freq: Once | INTRAMUSCULAR | Status: DC
  Filled 2014-01-18: qty 20

## 2014-01-18 MED ORDER — ONDANSETRON HCL 4 MG/2ML IJ SOLN
INTRAMUSCULAR | Status: AC
  Filled 2014-01-18: qty 2

## 2014-01-18 MED ORDER — DILTIAZEM HCL ER COATED BEADS 120 MG PO CP24
120.0000 mg | ORAL_CAPSULE | Freq: Two times a day (BID) | ORAL | Status: DC
  Administered 2014-01-18 – 2014-01-20 (×4): 120 mg via ORAL
  Filled 2014-01-18 (×5): qty 1

## 2014-01-18 MED ORDER — ROCURONIUM BROMIDE 100 MG/10ML IV SOLN
INTRAVENOUS | Status: AC
  Filled 2014-01-18: qty 1

## 2014-01-18 MED ORDER — SODIUM CHLORIDE 0.9 % IV SOLN: INTRAVENOUS | Status: DC

## 2014-01-18 MED ORDER — GLYCOPYRROLATE 0.2 MG/ML IJ SOLN
INTRAMUSCULAR | Status: AC
  Filled 2014-01-18: qty 3

## 2014-01-18 MED ORDER — PROPOFOL 10 MG/ML IV BOLUS
INTRAVENOUS | Status: AC
  Filled 2014-01-18: qty 20

## 2014-01-18 MED ORDER — LIDOCAINE HCL (CARDIAC) 20 MG/ML IV SOLN
INTRAVENOUS | Status: AC
  Filled 2014-01-18: qty 5

## 2014-01-18 MED ORDER — FENTANYL CITRATE 0.05 MG/ML IJ SOLN
25.0000 ug | INTRAMUSCULAR | Status: DC | PRN
  Administered 2014-01-18 (×3): 50 ug via INTRAVENOUS

## 2014-01-18 MED ORDER — SODIUM CHLORIDE 0.9 % IJ SOLN
INTRAMUSCULAR | Status: AC
  Filled 2014-01-18: qty 10

## 2014-01-18 MED ORDER — EPHEDRINE SULFATE 50 MG/ML IJ SOLN
INTRAMUSCULAR | Status: DC | PRN
  Administered 2014-01-18 (×2): 5 mg via INTRAVENOUS
  Administered 2014-01-18: 10 mg via INTRAVENOUS
  Administered 2014-01-18: 5 mg via INTRAVENOUS

## 2014-01-18 MED ORDER — MEPERIDINE HCL 50 MG/ML IJ SOLN: 6.2500 mg | INTRAMUSCULAR | Status: DC | PRN

## 2014-01-18 MED ORDER — RIVAROXABAN 10 MG PO TABS
10.0000 mg | ORAL_TABLET | Freq: Every day | ORAL | Status: DC
  Administered 2014-01-19 – 2014-01-20 (×2): 10 mg via ORAL
  Filled 2014-01-18 (×3): qty 1

## 2014-01-18 MED ORDER — LORAZEPAM 1 MG PO TABS
1.0000 mg | ORAL_TABLET | Freq: Every evening | ORAL | Status: DC | PRN
  Administered 2014-01-18 – 2014-01-19 (×2): 1 mg via ORAL
  Filled 2014-01-18 (×3): qty 1

## 2014-01-18 MED ORDER — BUPIVACAINE HCL (PF) 0.25 % IJ SOLN
INTRAMUSCULAR | Status: AC
  Filled 2014-01-18: qty 30

## 2014-01-18 MED ORDER — ACETAMINOPHEN 500 MG PO TABS
1000.0000 mg | ORAL_TABLET | Freq: Four times a day (QID) | ORAL | Status: AC
  Administered 2014-01-18 – 2014-01-19 (×3): 1000 mg via ORAL
  Filled 2014-01-18 (×4): qty 2

## 2014-01-18 MED ORDER — FUROSEMIDE 40 MG PO TABS
40.0000 mg | ORAL_TABLET | Freq: Every day | ORAL | Status: DC
  Administered 2014-01-18 – 2014-01-20 (×3): 40 mg via ORAL
  Filled 2014-01-18 (×3): qty 1

## 2014-01-18 MED ORDER — PROPOFOL INFUSION 10 MG/ML OPTIME
INTRAVENOUS | Status: DC | PRN
  Administered 2014-01-18: 120 ug/kg/min via INTRAVENOUS

## 2014-01-18 MED ORDER — DEXTROSE-NACL 5-0.9 % IV SOLN
INTRAVENOUS | Status: DC
  Administered 2014-01-18 – 2014-01-19 (×2): via INTRAVENOUS

## 2014-01-18 MED ORDER — FENTANYL CITRATE 0.05 MG/ML IJ SOLN
INTRAMUSCULAR | Status: AC
  Filled 2014-01-18: qty 2

## 2014-01-18 MED ORDER — CEFAZOLIN SODIUM-DEXTROSE 2-3 GM-% IV SOLR
INTRAVENOUS | Status: AC
  Filled 2014-01-18: qty 50

## 2014-01-18 MED ORDER — KETOROLAC TROMETHAMINE 15 MG/ML IJ SOLN
7.5000 mg | Freq: Four times a day (QID) | INTRAMUSCULAR | Status: AC | PRN
  Administered 2014-01-18 – 2014-01-19 (×2): 7.5 mg via INTRAVENOUS
  Filled 2014-01-18: qty 1

## 2014-01-18 MED ORDER — DEXTROSE 5 % IV SOLN
500.0000 mg | Freq: Four times a day (QID) | INTRAVENOUS | Status: DC | PRN
  Administered 2014-01-18: 500 mg via INTRAVENOUS
  Filled 2014-01-18: qty 5

## 2014-01-18 MED ORDER — DEXAMETHASONE 6 MG PO TABS
10.0000 mg | ORAL_TABLET | Freq: Every day | ORAL | Status: AC
  Administered 2014-01-19: 10 mg via ORAL
  Filled 2014-01-18: qty 1

## 2014-01-18 MED ORDER — LORATADINE 10 MG PO TABS
10.0000 mg | ORAL_TABLET | Freq: Every day | ORAL | Status: DC | PRN
  Filled 2014-01-18: qty 1

## 2014-01-18 MED ORDER — METOCLOPRAMIDE HCL 5 MG/ML IJ SOLN: 5.0000 mg | Freq: Three times a day (TID) | INTRAMUSCULAR | Status: DC | PRN

## 2014-01-18 MED ORDER — PANTOPRAZOLE SODIUM 40 MG PO TBEC
40.0000 mg | DELAYED_RELEASE_TABLET | Freq: Every day | ORAL | Status: DC
  Administered 2014-01-18 – 2014-01-20 (×3): 40 mg via ORAL
  Filled 2014-01-18 (×5): qty 1

## 2014-01-18 MED ORDER — GLYCOPYRROLATE 0.2 MG/ML IJ SOLN
INTRAMUSCULAR | Status: AC
  Filled 2014-01-18: qty 1

## 2014-01-18 MED ORDER — DEXAMETHASONE SODIUM PHOSPHATE 10 MG/ML IJ SOLN
10.0000 mg | Freq: Every day | INTRAMUSCULAR | Status: AC
  Filled 2014-01-18: qty 1

## 2014-01-18 MED ORDER — CEFAZOLIN SODIUM-DEXTROSE 2-3 GM-% IV SOLR
2.0000 g | INTRAVENOUS | Status: AC
  Administered 2014-01-18: 2 g via INTRAVENOUS

## 2014-01-18 MED ORDER — DEXAMETHASONE SODIUM PHOSPHATE 10 MG/ML IJ SOLN
INTRAMUSCULAR | Status: AC
  Filled 2014-01-18: qty 1

## 2014-01-18 MED ORDER — DM-GUAIFENESIN ER 30-600 MG PO TB12
1.0000 | ORAL_TABLET | Freq: Two times a day (BID) | ORAL | Status: DC
  Administered 2014-01-18 – 2014-01-20 (×4): 1 via ORAL
  Filled 2014-01-18 (×5): qty 1

## 2014-01-18 MED ORDER — LOSARTAN POTASSIUM 50 MG PO TABS
100.0000 mg | ORAL_TABLET | Freq: Every morning | ORAL | Status: DC
  Administered 2014-01-19 – 2014-01-20 (×2): 100 mg via ORAL
  Filled 2014-01-18 (×2): qty 2

## 2014-01-18 MED ORDER — BUPIVACAINE LIPOSOME 1.3 % IJ SUSP
INTRAMUSCULAR | Status: DC | PRN
  Administered 2014-01-18: 20 mL

## 2014-01-18 MED ORDER — NEOSTIGMINE METHYLSULFATE 1 MG/ML IJ SOLN
INTRAMUSCULAR | Status: AC
  Filled 2014-01-18: qty 10

## 2014-01-18 MED ORDER — SODIUM CHLORIDE 0.9 % IJ SOLN
INTRAMUSCULAR | Status: AC
  Filled 2014-01-18: qty 50

## 2014-01-18 MED ORDER — MORPHINE SULFATE 2 MG/ML IJ SOLN: 1.0000 mg | INTRAMUSCULAR | Status: DC | PRN

## 2014-01-18 MED ORDER — DOCUSATE SODIUM 100 MG PO CAPS
100.0000 mg | ORAL_CAPSULE | Freq: Two times a day (BID) | ORAL | Status: DC
  Administered 2014-01-18 – 2014-01-20 (×4): 100 mg via ORAL

## 2014-01-18 MED ORDER — DIPHENHYDRAMINE HCL 12.5 MG/5ML PO ELIX: 12.5000 mg | ORAL_SOLUTION | ORAL | Status: DC | PRN

## 2014-01-18 MED ORDER — ACETAMINOPHEN 325 MG PO TABS
650.0000 mg | ORAL_TABLET | Freq: Four times a day (QID) | ORAL | Status: DC | PRN
  Administered 2014-01-19 – 2014-01-20 (×2): 650 mg via ORAL
  Filled 2014-01-18 (×2): qty 2

## 2014-01-18 MED ORDER — FLEET ENEMA 7-19 GM/118ML RE ENEM: 1.0000 | ENEMA | Freq: Once | RECTAL | Status: AC | PRN

## 2014-01-18 MED ORDER — ONDANSETRON HCL 4 MG PO TABS
4.0000 mg | ORAL_TABLET | Freq: Four times a day (QID) | ORAL | Status: DC | PRN
Start: 2014-01-18 — End: 2014-01-20

## 2014-01-18 MED ORDER — MIDAZOLAM HCL 5 MG/5ML IJ SOLN
INTRAMUSCULAR | Status: DC | PRN
  Administered 2014-01-18: 1 mg via INTRAVENOUS

## 2014-01-18 MED ORDER — EPHEDRINE SULFATE 50 MG/ML IJ SOLN
INTRAMUSCULAR | Status: AC
  Filled 2014-01-18: qty 1

## 2014-01-18 MED ORDER — ACETAMINOPHEN 10 MG/ML IV SOLN
1000.0000 mg | Freq: Once | INTRAVENOUS | Status: AC
  Administered 2014-01-18: 1000 mg via INTRAVENOUS
  Filled 2014-01-18: qty 100

## 2014-01-18 MED ORDER — CEFAZOLIN SODIUM-DEXTROSE 2-3 GM-% IV SOLR
2.0000 g | Freq: Four times a day (QID) | INTRAVENOUS | Status: AC
  Administered 2014-01-18 (×2): 2 g via INTRAVENOUS
  Filled 2014-01-18 (×2): qty 50

## 2014-01-18 MED ORDER — POLYETHYLENE GLYCOL 3350 17 G PO PACK
17.0000 g | PACK | Freq: Every day | ORAL | Status: DC | PRN
  Administered 2014-01-20: 17 g via ORAL

## 2014-01-18 MED ORDER — MIDAZOLAM HCL 2 MG/2ML IJ SOLN
INTRAMUSCULAR | Status: AC
  Filled 2014-01-18: qty 2

## 2014-01-18 MED ORDER — MENTHOL 3 MG MT LOZG: 1.0000 | LOZENGE | OROMUCOSAL | Status: DC | PRN

## 2014-01-18 MED ORDER — GLYCOPYRROLATE 0.2 MG/ML IJ SOLN
INTRAMUSCULAR | Status: DC | PRN
  Administered 2014-01-18: 0.2 mg via INTRAVENOUS

## 2014-01-18 MED ORDER — ACETAMINOPHEN 650 MG RE SUPP: 650.0000 mg | Freq: Four times a day (QID) | RECTAL | Status: DC | PRN

## 2014-01-18 MED ORDER — TRAMADOL HCL 50 MG PO TABS
50.0000 mg | ORAL_TABLET | Freq: Four times a day (QID) | ORAL | Status: DC | PRN
  Administered 2014-01-18: 100 mg via ORAL
  Administered 2014-01-19: 50 mg via ORAL
  Administered 2014-01-20: 100 mg via ORAL
  Filled 2014-01-18 (×2): qty 2
  Filled 2014-01-18: qty 1

## 2014-01-18 MED ORDER — POTASSIUM CHLORIDE CRYS ER 20 MEQ PO TBCR
20.0000 meq | EXTENDED_RELEASE_TABLET | Freq: Every day | ORAL | Status: DC
  Administered 2014-01-18 – 2014-01-20 (×3): 20 meq via ORAL
  Filled 2014-01-18 (×3): qty 1

## 2014-01-18 MED ORDER — AMLODIPINE BESYLATE 2.5 MG PO TABS
2.5000 mg | ORAL_TABLET | Freq: Every morning | ORAL | Status: DC
  Administered 2014-01-19 – 2014-01-20 (×2): 2.5 mg via ORAL
  Filled 2014-01-18 (×2): qty 1

## 2014-01-18 MED ORDER — TRANEXAMIC ACID 100 MG/ML IV SOLN
1000.0000 mg | INTRAVENOUS | Status: AC
  Administered 2014-01-18: 1000 mg via INTRAVENOUS
  Filled 2014-01-18: qty 10

## 2014-01-18 MED ORDER — FLUTICASONE PROPIONATE 50 MCG/ACT NA SUSP
2.0000 | Freq: Every day | NASAL | Status: DC
  Administered 2014-01-18 – 2014-01-20 (×3): 2 via NASAL
  Filled 2014-01-18: qty 16

## 2014-01-18 MED ORDER — BUPIVACAINE HCL 0.25 % IJ SOLN
INTRAMUSCULAR | Status: DC | PRN
  Administered 2014-01-18: 20 mL

## 2014-01-18 MED ORDER — ZOLPIDEM TARTRATE 5 MG PO TABS
5.0000 mg | ORAL_TABLET | Freq: Every day | ORAL | Status: DC
  Administered 2014-01-18 – 2014-01-19 (×2): 5 mg via ORAL
  Filled 2014-01-18 (×2): qty 1

## 2014-01-18 MED ORDER — LIDOCAINE HCL (CARDIAC) 20 MG/ML IV SOLN
INTRAVENOUS | Status: DC | PRN
  Administered 2014-01-18: 100 mg via INTRAVENOUS

## 2014-01-18 MED ORDER — METOCLOPRAMIDE HCL 10 MG PO TABS: 5.0000 mg | ORAL_TABLET | Freq: Three times a day (TID) | ORAL | Status: DC | PRN

## 2014-01-18 MED ORDER — PROMETHAZINE HCL 25 MG/ML IJ SOLN: 6.2500 mg | INTRAMUSCULAR | Status: DC | PRN

## 2014-01-18 MED ORDER — ONDANSETRON HCL 4 MG/2ML IJ SOLN
INTRAMUSCULAR | Status: DC | PRN
  Administered 2014-01-18: 4 mg via INTRAVENOUS

## 2014-01-18 SURGICAL SUPPLY — 60 items
BAG SPEC THK2 15X12 ZIP CLS (MISCELLANEOUS) ×1
BAG ZIPLOCK 12X15 (MISCELLANEOUS) ×3 IMPLANT
BANDAGE ELASTIC 6 VELCRO ST LF (GAUZE/BANDAGES/DRESSINGS) ×3 IMPLANT
BANDAGE ESMARK 6X9 LF (GAUZE/BANDAGES/DRESSINGS) ×1 IMPLANT
BLADE SAG 18X100X1.27 (BLADE) ×3 IMPLANT
BLADE SAW SGTL 11.0X1.19X90.0M (BLADE) ×3 IMPLANT
BNDG CMPR 9X6 STRL LF SNTH (GAUZE/BANDAGES/DRESSINGS) ×1
BNDG ESMARK 6X9 LF (GAUZE/BANDAGES/DRESSINGS) ×3
BOWL SMART MIX CTS (DISPOSABLE) ×3 IMPLANT
CAPT RP KNEE ×2 IMPLANT
CEMENT HV SMART SET (Cement) ×6 IMPLANT
CLOSURE WOUND 1/2 X4 (GAUZE/BANDAGES/DRESSINGS) ×1
CUFF TOURN SGL QUICK 34 (TOURNIQUET CUFF) ×3
CUFF TRNQT CYL 34X4X40X1 (TOURNIQUET CUFF) ×1 IMPLANT
DECANTER SPIKE VIAL GLASS SM (MISCELLANEOUS) ×3 IMPLANT
DRAPE EXTREMITY T 121X128X90 (DRAPE) ×3 IMPLANT
DRAPE POUCH INSTRU U-SHP 10X18 (DRAPES) ×3 IMPLANT
DRAPE U-SHAPE 47X51 STRL (DRAPES) ×3 IMPLANT
DRSG ADAPTIC 3X8 NADH LF (GAUZE/BANDAGES/DRESSINGS) ×3 IMPLANT
DRSG PAD ABDOMINAL 8X10 ST (GAUZE/BANDAGES/DRESSINGS) ×3 IMPLANT
DURAPREP 26ML APPLICATOR (WOUND CARE) ×3 IMPLANT
ELECT REM PT RETURN 9FT ADLT (ELECTROSURGICAL) ×3
ELECTRODE REM PT RTRN 9FT ADLT (ELECTROSURGICAL) ×1 IMPLANT
EVACUATOR 1/8 PVC DRAIN (DRAIN) ×3 IMPLANT
FACESHIELD WRAPAROUND (MASK) ×15 IMPLANT
FACESHIELD WRAPAROUND OR TEAM (MASK) ×5 IMPLANT
GLOVE BIO SURGEON STRL SZ7.5 (GLOVE) IMPLANT
GLOVE BIO SURGEON STRL SZ8 (GLOVE) ×3 IMPLANT
GLOVE BIOGEL PI IND STRL 8 (GLOVE) ×2 IMPLANT
GLOVE BIOGEL PI INDICATOR 8 (GLOVE) ×4
GLOVE SURG SS PI 6.5 STRL IVOR (GLOVE) IMPLANT
GOWN STRL REUS W/TWL LRG LVL3 (GOWN DISPOSABLE) ×3 IMPLANT
GOWN STRL REUS W/TWL XL LVL3 (GOWN DISPOSABLE) IMPLANT
HANDPIECE INTERPULSE COAX TIP (DISPOSABLE) ×3
IMMOBILIZER KNEE 20 (SOFTGOODS) ×3 IMPLANT
KIT BASIN OR (CUSTOM PROCEDURE TRAY) ×3 IMPLANT
MANIFOLD NEPTUNE II (INSTRUMENTS) ×3 IMPLANT
NDL SAFETY ECLIPSE 18X1.5 (NEEDLE) ×2 IMPLANT
NEEDLE HYPO 18GX1.5 SHARP (NEEDLE) ×6
NS IRRIG 1000ML POUR BTL (IV SOLUTION) ×3 IMPLANT
PACK TOTAL JOINT (CUSTOM PROCEDURE TRAY) ×3 IMPLANT
PAD ABD 8X10 STRL (GAUZE/BANDAGES/DRESSINGS) ×2 IMPLANT
PADDING CAST COTTON 6X4 STRL (CAST SUPPLIES) ×5 IMPLANT
POSITIONER SURGICAL ARM (MISCELLANEOUS) ×3 IMPLANT
SET HNDPC FAN SPRY TIP SCT (DISPOSABLE) ×1 IMPLANT
SPONGE GAUZE 4X4 12PLY (GAUZE/BANDAGES/DRESSINGS) ×3 IMPLANT
STRIP CLOSURE SKIN 1/2X4 (GAUZE/BANDAGES/DRESSINGS) ×3 IMPLANT
SUCTION FRAZIER 12FR DISP (SUCTIONS) ×3 IMPLANT
SUT MNCRL AB 4-0 PS2 18 (SUTURE) ×3 IMPLANT
SUT VIC AB 2-0 CT1 27 (SUTURE) ×9
SUT VIC AB 2-0 CT1 TAPERPNT 27 (SUTURE) ×3 IMPLANT
SUT VLOC 180 0 24IN GS25 (SUTURE) ×5 IMPLANT
SYR 20CC LL (SYRINGE) ×3 IMPLANT
SYR 50ML LL SCALE MARK (SYRINGE) ×3 IMPLANT
TOWEL OR 17X26 10 PK STRL BLUE (TOWEL DISPOSABLE) ×3 IMPLANT
TOWEL OR NON WOVEN STRL DISP B (DISPOSABLE) IMPLANT
TRAY FOLEY CATH 14FRSI W/METER (CATHETERS) ×1 IMPLANT
TRAY FOLEY CATH 16FRSI W/METER (SET/KITS/TRAYS/PACK) ×2 IMPLANT
WATER STERILE IRR 1500ML POUR (IV SOLUTION) ×3 IMPLANT
WRAP KNEE MAXI GEL POST OP (GAUZE/BANDAGES/DRESSINGS) ×3 IMPLANT

## 2014-01-18 NOTE — Op Note (Signed)
Pre-operative diagnosis- Osteoarthritis  Right knee(s)  Post-operative diagnosis- Osteoarthritis Right knee(s)  Procedure-  Right  Total Knee Arthroplasty  Surgeon- Dione Plover. Adalynd Donahoe, MD  Assistant- Ardeen Jourdain, PA-C   Anesthesia-  Spinal EBL-* No blood loss amount entered *  Drains Hemovac  Tourniquet time-  Total Tourniquet Time Documented: Thigh (Right) - 32 minutes Total: Thigh (Right) - 32 minutes    Complications- None  Condition-PACU - hemodynamically stable.   Brief Clinical Note  Julian Fowler is a 79 y.o. year old male with end stage OA of his right knee with progressively worsening pain and dysfunction. He has constant pain, with activity and at rest and significant functional deficits with difficulties even with ADLs. He has had extensive non-op management including analgesics, injections of cortisone and viscosupplements, and home exercise program, but remains in significant pain with significant dysfunction. Radiographs show bone on bone medial and patellofemoral. He presents now for right Total Knee Arthroplasty.    Procedure in detail---   The patient is brought into the operating room and positioned supine on the operating table. After successful administration of  Spinal,   a tourniquet is placed high on the  Right thigh(s) and the lower extremity is prepped and draped in the usual sterile fashion. Time out is performed by the operating team and then the  Right lower extremity is wrapped in Esmarch, knee flexed and the tourniquet inflated to 300 mmHg.       A midline incision is made with a ten blade through the subcutaneous tissue to the level of the extensor mechanism. A fresh blade is used to make a medial parapatellar arthrotomy. Soft tissue over the proximal medial tibia is subperiosteally elevated to the joint line with a knife and into the semimembranosus bursa with a Cobb elevator. Soft tissue over the proximal lateral tibia is elevated with attention being  paid to avoiding the patellar tendon on the tibial tubercle. The patella is everted, knee flexed 90 degrees and the ACL and PCL are removed. Findings are bone on bone medial and patellofemoral with large medial osteophytes.        The drill is used to create a starting hole in the distal femur and the canal is thoroughly irrigated with sterile saline to remove the fatty contents. The 5 degree Right  valgus alignment guide is placed into the femoral canal and the distal femoral cutting block is pinned to remove 10 mm off the distal femur. Resection is made with an oscillating saw.      The tibia is subluxed forward and the menisci are removed. The extramedullary alignment guide is placed referencing proximally at the medial aspect of the tibial tubercle and distally along the second metatarsal axis and tibial crest. The block is pinned to remove 46mm off the more deficient medial  side. Resection is made with an oscillating saw. Size 3is the most appropriate size for the tibia and the proximal tibia is prepared with the modular drill and keel punch for that size.      The femoral sizing guide is placed and size 4 is most appropriate. Rotation is marked off the epicondylar axis and confirmed by creating a rectangular flexion gap at 90 degrees. The size 4 cutting block is pinned in this rotation and the anterior, posterior and chamfer cuts are made with the oscillating saw. The intercondylar block is then placed and that cut is made.      Trial size 3 tibial component, trial size 4 posterior  stabilized femur and a 10  mm posterior stabilized rotating platform insert trial is placed. Full extension is achieved with excellent varus/valgus and anterior/posterior balance throughout full range of motion. The patella is everted and thickness measured to be 22  mm. Free hand resection is taken to 12 mm, a 38 template is placed, lug holes are drilled, trial patella is placed, and it tracks normally. Osteophytes are removed  off the posterior femur with the trial in place. All trials are removed and the cut bone surfaces prepared with pulsatile lavage. Cement is mixed and once ready for implantation, the size 3 tibial implant, size  4 posterior stabilized femoral component, and the size 38 patella are cemented in place and the patella is held with the clamp. The trial insert is placed and the knee held in full extension. The Exparel (20 ml mixed with 30 ml saline) and .25% Bupivicaine, are injected into the extensor mechanism, posterior capsule, medial and lateral gutters and subcutaneous tissues.  All extruded cement is removed and once the cement is hard the permanent 10 mm posterior stabilized rotating platform insert is placed into the tibial tray.      The wound is copiously irrigated with saline solution and the extensor mechanism closed over a hemovac drain with #1 PDS suture. The tourniquet is released for a total tourniquet time of 32  minutes. Flexion against gravity is 140 degrees and the patella tracks normally. Subcutaneous tissue is closed with 2.0 vicryl and subcuticular with running 4.0 Monocryl. The incision is cleaned and dried and steri-strips and a bulky sterile dressing are applied. The limb is placed into a knee immobilizer and the patient is awakened and transported to recovery in stable condition.      Please note that a surgical assistant was a medical necessity for this procedure in order to perform it in a safe and expeditious manner. Surgical assistant was necessary to retract the ligaments and vital neurovascular structures to prevent injury to them and also necessary for proper positioning of the limb to allow for anatomic placement of the prosthesis.   Dione Plover Naesha Buckalew, MD    01/18/2014, 8:16 AM

## 2014-01-18 NOTE — Transfer of Care (Signed)
Immediate Anesthesia Transfer of Care Note  Patient: Julian Fowler  Procedure(s) Performed: Procedure(s) (LRB): RIGHT TOTAL KNEE ARTHROPLASTY (Right)  Patient Location: PACU  Anesthesia Type: Spinal  Level of Consciousness: sedated, patient cooperative and responds to stimulation  Airway & Oxygen Therapy: Patient Spontanous Breathing and Patient connected to face mask oxgen  Post-op Assessment: Report given to PACU RN and Post -op Vital signs reviewed and stable  Post vital signs: Reviewed and stable  Complications: No apparent anesthesia complications

## 2014-01-18 NOTE — Anesthesia Postprocedure Evaluation (Signed)
  Anesthesia Post-op Note  Patient: Julian Fowler  Procedure(s) Performed: Procedure(s) (LRB): RIGHT TOTAL KNEE ARTHROPLASTY (Right)  Patient Location: PACU  Anesthesia Type: Spinal  Level of Consciousness: awake and alert   Airway and Oxygen Therapy: Patient Spontanous Breathing  Post-op Pain: mild  Post-op Assessment: Post-op Vital signs reviewed, Patient's Cardiovascular Status Stable, Respiratory Function Stable, Patent Airway and No signs of Nausea or vomiting  Last Vitals:  Filed Vitals:   01/18/14 1518  BP:   Pulse:   Temp:   Resp: 16    Post-op Vital Signs: stable   Complications: No apparent anesthesia complications

## 2014-01-18 NOTE — Anesthesia Preprocedure Evaluation (Addendum)
Anesthesia Evaluation  Patient identified by MRN, date of birth, ID band Patient awake    Reviewed: Allergy & Precautions, H&P , NPO status , Patient's Chart, lab work & pertinent test results, reviewed documented beta blocker date and time   Airway Mallampati: II TM Distance: >3 FB Neck ROM: full    Dental no notable dental hx. (+) Missing, Dental Advisory Given Missing all lower front and left half of upper front.  Large chip on right upper front.:   Pulmonary asthma , COPDformer smoker,  breath sounds clear to auscultation  Pulmonary exam normal       Cardiovascular Exercise Tolerance: Good hypertension, Pt. on medications + dysrhythmias Supra Ventricular Tachycardia Rhythm:regular Rate:Normal     Neuro/Psych Anxiety Depression Back surgery 6 weeks ago negative neurological ROS  negative psych ROS   GI/Hepatic negative GI ROS, Neg liver ROS, GERD-  Medicated and Controlled,  Endo/Other  negative endocrine ROS  Renal/GU negative Renal ROS  negative genitourinary   Musculoskeletal   Abdominal   Peds  Hematology negative hematology ROS (+)   Anesthesia Other Findings K 2.8 today  Reproductive/Obstetrics negative OB ROS                           Anesthesia Physical  Anesthesia Plan  ASA: III  Anesthesia Plan: Spinal   Post-op Pain Management:    Induction: Intravenous  Airway Management Planned: Simple Face Mask  Additional Equipment:   Intra-op Plan:   Post-operative Plan:   Informed Consent: I have reviewed the patients History and Physical, chart, labs and discussed the procedure including the risks, benefits and alternatives for the proposed anesthesia with the patient or authorized representative who has indicated his/her understanding and acceptance.   Dental Advisory Given  Plan Discussed with: CRNA and Surgeon  Anesthesia Plan Comments:        Anesthesia Quick  Evaluation

## 2014-01-18 NOTE — Progress Notes (Signed)
Utilization review completed.  

## 2014-01-18 NOTE — H&P (View-Only) (Signed)
Julian Fowler DOB: [birth year] Single / Language: Cleophus Molt / Race: White Male  Date of Admission:  01-18-2014  Chief Complaint:  Right Knee Pain  History of Present Illness The patient is a 79 year old male who comes in for a preoperative History and Physical. The patient is scheduled for a right total knee arthroplasty to be performed by Dr. Dione Plover. Aluisio, MD at [facility] on 01-18-2014. The patient is a 79 year old male presenting for his knee pain. The patient comes in several months out from left total knee arthroplasty. The patient states that he is doing well (mainly notes a lot swelling and redness) at this time. They are currently on no medication for their pain. The patient is currently doing home exercise program (swimming/water aerobics). The opposite, right, knee continues to be a problem. He has popping and cracking. The pain has increased especially since the left knee is now doing so well. He has improved greatly with the left knee replacement and is now felt he would benefit from getting the other knee done at this time. He is ready to proceed with the right knee surgery. They have been treated conservatively in the past for the above stated problem and despite conservative measures, they continue to have progressive pain and severe functional limitations and dysfunction. They have failed non-operative management including home exercise, medications, and injections. It is felt that they would benefit from undergoing total joint replacement. Risks and benefits of the procedure have been discussed with the patient and they elect to proceed with surgery. There are no active contraindications to surgery such as ongoing infection or rapidly progressive neurological disease.  Allergies Adhesive Tape. 04/10/2012 Other (See Comments). User imported and matched from unrecognized allergy [Tape] Codeine Derivatives Codeine Sulfate *ANALGESICS -  OPIOID*. 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 Does not use CPAP Hypertension History of Acoustic Neuroma. Surgery 1995 Shingles Hiatal Hernia High blood pressure Gastroesophageal Reflux Disease Impaired Hearing. 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 reported Chronic Obstructive Lung Disease. sister Congestive Heart Failure. mother Hypertension. Mother, Brother. mother and brother Cancer. mother and father Drug / Alcohol Addiction. father    Social History Exercise. Exercises daily; does individual sport Tobacco use. Former smoker. former smoker; smoke(d) 1 pack(s) per day Drug/Alcohol Rehab (Previously). no Number of flights of stairs before winded. greater than 5 Marital status. divorced Post-Surgical Plans. Plan is to go home this time following surgery. Children. 2 Alcohol use. former drinker Illicit drug use. no Tobacco / smoke exposure. no Current work status. retired Engineer, agricultural (Currently). no Former smoker Pain Contract. no   Medication History Tylenol (325MG  Tablet, Oral) Active. Calcium Carbonate ( Oral) Specific dose unknown - Active. Centrum Silver ( Oral) Active. Aleve (220MG  Tablet, Oral) Active. PriLOSEC (20MG  Capsule DR, Oral) Active. Vitamin A (8000UNIT Capsule, Oral) Active. AmLODIPine Besylate (2.5MG  Tablet, Oral) Active. Fexofenadine HCl (180MG  Tablet, Oral) Active. Zolpidem Tartrate (10MG  Tablet, Oral) Active. LORazepam (1MG  Tablet, Oral) Active. Diltiazem HCl ER Coated Beads (120MG  Capsule ER 24HR, Oral) Active. Losartan Potassium (100MG  Tablet, Oral) Active. Potassium Chloride Crys ER (20MEQ Tablet ER, 20 mEq Oral, Taken starting 07/08/2013) Active. Furosemide (40MG   Tablet, Oral) Active. Pravastatin Sodium (20MG  Tablet, Oral) Active. Latanoprost (0.005% Solution, Ophthalmic) Active. Medications Reconciled.    Past Surgical History  Vasectomy Gallbladder Surgery. open Total Knee Replacement - Left   Review of Systems General:Not Present- Chills, Fever, Night Sweats, Fatigue, Weight Gain, Weight Loss and Memory Loss. Skin:Not Present- Hives, Itching, Rash, Eczema and Lesions. HEENT:Present- Hearing problems. Not Present- Tinnitus, Headache, Double Vision, Visual Loss, Hearing Loss and Dentures. Respiratory:Not Present- Shortness of breath with exertion, Shortness of breath at rest, Allergies, Coughing up blood and Chronic Cough. Cardiovascular:Not Present- Chest Pain, Racing/skipping heartbeats, Difficulty Breathing Lying Down, Murmur, Swelling and Palpitations. Gastrointestinal:Not Present- Bloody Stool, Heartburn, Abdominal Pain, Vomiting, Nausea, Constipation, Diarrhea, Difficulty Swallowing, Jaundice and Loss of appetitie. Male Genitourinary:Present- Urinary frequency and Urinating at Night. Not Present- Blood in Urine, Weak urinary stream, Discharge, Flank Pain, Incontinence, Painful Urination, Urgency and Urinary Retention. Musculoskeletal:Present- Joint Pain and Back Pain. Not Present- Muscle Weakness, Muscle Pain, Joint Swelling, Morning Stiffness and Spasms. Neurological:Not Present- Tremor, Dizziness, Blackout spells, Paralysis, Difficulty with balance and Weakness. Psychiatric:Not Present- Insomnia.    Vitals Pulse: 58 (Regular) Resp.: 16 (Unlabored) BP: 146/72 (Sitting, Right Arm, Standard)     Physical Exam The physical exam findings are as follows:  Note: Patient is a 79 year old male with continued right knee pain. Patient is accompanied today by his wife on exam.   General Mental Status - Alert, cooperative and good historian. General Appearance- pleasant. Not in acute distress. Orientation-  Oriented X3. Build & Nutrition- Well nourished and Well developed.   Head and Neck Head- normocephalic, atraumatic . Neck Global Assessment- supple. no bruit auscultated on the right and no bruit auscultated on the left. Note: upper and lower dentures  Eye Vision- Wears corrective lenses. Pupil- Bilateral- Regular and Round. Motion- Bilateral- EOMI.   Chest and Lung Exam Auscultation: Breath sounds:- clear at anterior chest wall and - clear at posterior chest wall. Adventitious sounds:- No Adventitious sounds.   Cardiovascular Auscultation:Rhythm- Regular rate and rhythm. Heart Sounds- S1 WNL and S2 WNL. Murmurs & Other Heart Sounds: Murmur 1:Location- Aortic Area. Timing- Mid-systolic. Grade- III/VI. Character- Blowing.   Abdomen Palpation/Percussion:Tenderness- Abdomen is non-tender to palpation. Rigidity (guarding)- Abdomen is soft. Auscultation:Auscultation of the abdomen reveals - Bowel sounds normal.   Male Genitourinary Not done, not pertinent to present illness  Musculoskeletal  On exam he is alert and oriented in no apparent distress. His left knee looks much better. There is no swelling in the knee. His ankle is swollen, but it looks better than it did previously. Range of motion of the knee is 0 to 128 degrees with no instability. His right knee shows no effusion. There is verus deformity. Range of motion of the right knee is 5 to 125 with tenderness medial greater than lateral with no instability.  RADIOGRAPHS: Previous x rays of his right knee from earlier this year and he had bone on bone arthritis medial and patellofemoral.   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 Wynelle Link.  Plan is to go home.  PCP - Dr. Dalia Heading - [facility] - Dr.  Ron Parker  The patient does not have any contraindications and will receive TXA (tranexamic acid) prior to surgery.  Signed electronically by Joelene Millin, III PA-C

## 2014-01-18 NOTE — Anesthesia Procedure Notes (Signed)
Spinal  Patient location during procedure: OR Staffing Anesthesiologist: Montez Hageman Performed by: anesthesiologist  Preanesthetic Checklist Completed: patient identified, site marked, surgical consent, pre-op evaluation, timeout performed, IV checked, risks and benefits discussed and monitors and equipment checked Spinal Block Patient position: sitting Prep: Betadine Patient monitoring: heart rate, continuous pulse ox and blood pressure Location: L4-5 Injection technique: single-shot Needle Needle type: Spinocan  Needle gauge: 22 G Needle length: 9 cm Additional Notes Expiration date of kit checked and confirmed. Patient tolerated procedure well, without complications.

## 2014-01-18 NOTE — Interval H&P Note (Signed)
History and Physical Interval Note:  01/18/2014 6:55 AM  Julian Fowler  has presented today for surgery, with the diagnosis of OA RIGHT KNEE   The various methods of treatment have been discussed with the patient and family. After consideration of risks, benefits and other options for treatment, the patient has consented to  Procedure(s): RIGHT TOTAL KNEE ARTHROPLASTY (Right) as a surgical intervention .  The patient's history has been reviewed, patient examined, no change in status, stable for surgery.  I have reviewed the patient's chart and labs.  Questions were answered to the patient's satisfaction.     Gearlean Alf

## 2014-01-18 NOTE — Plan of Care (Signed)
Problem: Phase I Progression Outcomes Goal: Pain controlled with appropriate interventions Outcome: Progressing Pt denies any pain at this time. Pt educated on importance of informing RN of pain when it first starts happening. Pt educated on pain scale and interventions. Pt comments that he does not want Morphine.

## 2014-01-18 NOTE — Evaluation (Signed)
Physical Therapy Evaluation Patient Details Name: Julian Fowler MRN: [id number] DOB: 04-26-1935 Today's Date: 01/18/2014   History of Present Illness  s/p R TKA   Clinical Impression  Pt doing well today; will benefit from PT to address deficits below    Follow Up Recommendations Home health PT;Supervision for mobility/OOB    Equipment Recommendations       Recommendations for Other Services       Precautions / Restrictions Precautions Required Braces or Orthoses: Knee Immobilizer - Right Knee Immobilizer - Right: Discontinue once straight leg raise with < 10 degree lag Restrictions Weight Bearing Restrictions: No Other Position/Activity Restrictions: WBAT      Mobility  Bed Mobility Overal bed mobility: Needs Assistance Bed Mobility: Supine to Sit     Supine to sit: Min assist     General bed mobility comments: cues for self assist  Transfers Overall transfer level: Needs assistance Equipment used: Rolling walker (2 wheeled) Transfers: Sit to/from Stand Sit to Stand: Min assist         General transfer comment: cues for hand placement and safe technique  Ambulation/Gait Ambulation/Gait assistance: Min assist;Min guard Ambulation Distance (Feet): 160 Feet Assistive device: Rolling walker (2 wheeled) Gait Pattern/deviations: Step-through pattern;Step-to pattern;Antalgic     General Gait Details: cues for RW safety  Stairs            Wheelchair Mobility    Modified Rankin (Stroke Patients Only)       Balance                                             Pertinent Vitals/Pain Pain controlled    Home Living Family/patient expects to be discharged to:: Private residence Living Arrangements: Spouse/significant other   Type of Home: House Home Access: Stairs to enter   Technical brewer of Steps: 1 Home Layout: One level        Prior Function Level of Independence: Independent               Hand  Dominance        Extremity/Trunk Assessment               Lower Extremity Assessment: RLE deficits/detail RLE Deficits / Details: able to assist with SLR; ankle WFL       Communication   Communication: No difficulties  Cognition Arousal/Alertness: Awake/alert Behavior During Therapy: WFL for tasks assessed/performed Overall Cognitive Status: Within Functional Limits for tasks assessed                      General Comments      Exercises Total Joint Exercises Ankle Circles/Pumps: AROM;Both;15 reps Quad Sets: AROM;Both;15 reps      Assessment/Plan    PT Assessment Patient needs continued PT services  PT Diagnosis Difficulty walking   PT Problem List Decreased strength;Decreased range of motion;Decreased knowledge of use of DME;Decreased mobility;Decreased knowledge of precautions  PT Treatment Interventions     PT Goals (Current goals can be found in the Care Plan section) Acute Rehab PT Goals Patient Stated Goal: I  PT Goal Formulation: With patient Time For Goal Achievement: 01/22/14 Potential to Achieve Goals: Good    Frequency 7X/week   Barriers to discharge        Co-evaluation               End of  Session Equipment Utilized During Treatment: Gait belt Activity Tolerance: Patient tolerated treatment well Patient left: in chair;with call bell/phone within reach;with family/visitor present Nurse Communication: Mobility status         Time: 6010-9323 PT Time Calculation (min): 25 min   Charges:   PT Evaluation $Initial PT Evaluation Tier I: 1 Procedure PT Treatments $Gait Training: 23-37 mins   PT G Codes:          Julian Fowler 29-Jan-2014, 2:15 PM

## 2014-01-18 NOTE — Progress Notes (Signed)
Pt arrived to room 1601 by bed, assisted by PACU RN. Wife and daughter at bedside. VSS. Pt A&Ox4. Oriented to room and room safety. Pt denies pain. Bed locked and in lowest position.

## 2014-01-19 LAB — BASIC METABOLIC PANEL
BUN: 12 mg/dL (ref 6–23)
CHLORIDE: 95 meq/L — AB (ref 96–112)
CO2: 24 mEq/L (ref 19–32)
Calcium: 8.4 mg/dL (ref 8.4–10.5)
Creatinine, Ser: 0.74 mg/dL (ref 0.50–1.35)
GFR calc non Af Amer: 87 mL/min — ABNORMAL LOW (ref >90)
GLUCOSE: 141 mg/dL — AB (ref 70–99)
POTASSIUM: 3.8 meq/L (ref 3.7–5.3)
Sodium: 131 mEq/L — ABNORMAL LOW (ref 137–147)

## 2014-01-19 LAB — CBC
HCT: 34 % — ABNORMAL LOW (ref 39.0–52.0)
HEMOGLOBIN: 11.7 g/dL — AB (ref 13.0–17.0)
MCH: 28.5 pg (ref 26.0–34.0)
MCHC: 34.4 g/dL (ref 30.0–36.0)
MCV: 82.9 fL (ref 78.0–100.0)
Platelets: 341 10*3/uL (ref 150–400)
RBC: 4.1 MIL/uL — ABNORMAL LOW (ref 4.22–5.81)
RDW: 15.7 % — ABNORMAL HIGH (ref 11.5–15.5)
WBC: 14.2 10*3/uL — ABNORMAL HIGH (ref 4.0–10.5)

## 2014-01-19 MED ORDER — HYDROMORPHONE HCL 2 MG PO TABS: 2.0000 mg | ORAL_TABLET | ORAL | Status: DC | PRN

## 2014-01-19 MED ORDER — DSS 100 MG PO CAPS: 100.0000 mg | ORAL_CAPSULE | Freq: Two times a day (BID) | ORAL | Status: DC

## 2014-01-19 MED ORDER — TRAMADOL HCL 50 MG PO TABS: 50.0000 mg | ORAL_TABLET | Freq: Four times a day (QID) | ORAL | Status: DC | PRN

## 2014-01-19 MED ORDER — METHOCARBAMOL 500 MG PO TABS: 500.0000 mg | ORAL_TABLET | Freq: Four times a day (QID) | ORAL | Status: DC | PRN

## 2014-01-19 MED ORDER — RIVAROXABAN 10 MG PO TABS: 10.0000 mg | ORAL_TABLET | Freq: Every day | ORAL | Status: DC

## 2014-01-19 NOTE — Progress Notes (Signed)
Physical Therapy Treatment Patient Details Name: Julian Fowler MRN: [id number] DOB: [birth year] Today's Date: Jan 28, 2014    History of Present Illness s/p R TKA     PT Comments    Pt progressing. Pain controlled, ice to knee after session  Follow Up Recommendations  Home health PT;Supervision for mobility/OOB     Equipment Recommendations  None recommended by PT    Recommendations for Other Services       Precautions / Restrictions Precautions Precautions: Knee Precaution Comments: I SLR today Knee Immobilizer - Right: Discontinue once straight leg raise with < 10 degree lag Restrictions Weight Bearing Restrictions: No Other Position/Activity Restrictions: WBAT    Mobility  Bed Mobility Overal bed mobility: Needs Assistance Bed Mobility: Supine to Sit     Supine to sit: Supervision     General bed mobility comments: cues for RLE position  Transfers Overall transfer level: Needs assistance Equipment used: Rolling walker (2 wheeled) Transfers: Sit to/from Stand Sit to Stand: Supervision         General transfer comment: cues for hand placement and safe technique  Ambulation/Gait Ambulation/Gait assistance: Min guard Ambulation Distance (Feet): 400 Feet Assistive device: Rolling walker (2 wheeled) Gait Pattern/deviations: Step-to pattern;Step-through pattern;Antalgic     General Gait Details: cues for RW safety   Stairs Stairs: Yes Stairs assistance: Min guard Stair Management: Two rails;Forwards;Step to pattern Number of Stairs: 3    Wheelchair Mobility    Modified Rankin (Stroke Patients Only)       Balance                                    Cognition Arousal/Alertness: Awake/alert Behavior During Therapy: WFL for tasks assessed/performed Overall Cognitive Status: Within Functional Limits for tasks assessed                      Exercises Total Joint Exercises Ankle Circles/Pumps: AROM;Both;15 reps Quad  Sets: AROM;Both;15 reps Heel Slides: AROM;AAROM;Right;10 reps Straight Leg Raises: AROM;Strengthening;Right;10 reps Goniometric ROM: ~85*    General Comments        Pertinent Vitals/Pain sats 97% on RA    Home Living                      Prior Function            PT Goals (current goals can now be found in the care plan section) Acute Rehab PT Goals Time For Goal Achievement: 01/22/14 Potential to Achieve Goals: Good Progress towards PT goals: Progressing toward goals    Frequency  7X/week    PT Plan Current plan remains appropriate    Co-evaluation             End of Session Equipment Utilized During Treatment: Gait belt Activity Tolerance: Patient tolerated treatment well Patient left: in chair;with call bell/phone within reach;with family/visitor present     Time: 0931-1002 PT Time Calculation (min): 31 min  Charges:  $Gait Training: 8-22 mins $Therapeutic Exercise: 8-22 mins                    G Codes:      Dutchess Crosland 2014-01-28, 10:08 AM

## 2014-01-19 NOTE — Progress Notes (Signed)
MD/PA aware of pravastatin not being re-ordered. Pharamacy does not carry patients formulary. MD/PA informed patient to simply resume this medication when he returns home, and to not take this medication here at the hospital because he would have only missed 2 days. Judson Roch, RN

## 2014-01-19 NOTE — Progress Notes (Signed)
01/19/14 1334  PT Visit Information  Last PT Received On 01/19/14  Assistance Needed +1  History of Present Illness s/p R TKA   PT Time Calculation  PT Start Time 1309  PT Stop Time 1333  PT Time Calculation (min) 24 min  Subjective Data  Subjective let's go  Patient Stated Goal I   Precautions  Precautions Knee  Precaution Comments I SLR today  Restrictions  Other Position/Activity Restrictions WBAT  Cognition  Arousal/Alertness Awake/alert  Behavior During Therapy WFL for tasks assessed/performed  Overall Cognitive Status Within Functional Limits for tasks assessed  Bed Mobility  Overal bed mobility Needs Assistance  Bed Mobility Sit to Supine  Sit to supine Supervision  General bed mobility comments cues for RLE position  Transfers  Overall transfer level Needs assistance  Equipment used Rolling walker (2 wheeled)  Transfers Sit to/from Stand  Sit to Stand Supervision  General transfer comment cues for hand placement and safe technique  Ambulation/Gait  Ambulation/Gait assistance Supervision  Ambulation Distance (Feet) 410 Feet  Assistive device Rolling walker (2 wheeled)  Gait Pattern/deviations Step-through pattern;Wide base of support  General Gait Details cues for RW safety during turn and upward gaze  Total Joint Exercises  Heel Slides AROM;Right;5 reps  PT - End of Session  Equipment Utilized During Treatment Gait belt  Activity Tolerance Patient tolerated treatment well  Patient left in bed;with call bell/phone within reach  Nurse Communication Mobility status  PT - Assessment/Plan  PT Plan Current plan remains appropriate  PT Frequency 7X/week  Follow Up Recommendations Home health PT;Supervision for mobility/OOB  PT equipment None recommended by PT  PT Goal Progression  Progress towards PT goals Progressing toward goals  Acute Rehab PT Goals  Time For Goal Achievement 01/22/14  Potential to Achieve Goals Good  PT General Charges  $$ ACUTE PT VISIT  1 Procedure  PT Treatments  $Gait Training 23-37 mins

## 2014-01-19 NOTE — Progress Notes (Signed)
OT Cancellation Note  Patient Details Name: Julian Fowler MRN: [id number] DOB: [birth year]   Cancelled Treatment:    Reason Eval/Treat Not Completed: Other (comment).  Pt had L TKA done last Sept. Has 2 rails by commode and doesn't feel he needs DME.  Reviewed shower sequence.  Screen only.    Omari Mcmanaway 01/19/2014, 8:32 AM Lesle Chris, OTR/L [phone] 01/19/2014

## 2014-01-19 NOTE — Progress Notes (Signed)
  Subjective:  1 Day Post-Op Procedure(s) (LRB):  RIGHT TOTAL KNEE ARTHROPLASTY  Patient reports pain as mild.  Patient seen in rounds with Dr. Wynelle Link.  Patient is well, and has had no acute complaints or problems. No issues overnight other than poor sleep due to multiple interruptions. No SOB or chest pain.  We will start therapy today.  Plan is to go Home after hospital stay.  Objective:  Vital signs in last 24 hours:  Temp: [97.3 F (36.3 C)-99.3 F (37.4 C)] 98.8 F (37.1 C) (04/07 0528)  Pulse Rate: [72-97] 85 (04/07 0528)  Resp: [15-20] 18 (04/07 0528)  BP: (98-149)/(62-90) 143/87 mmHg (04/07 0528)  SpO2: [93 %-100 %] 97 % (04/07 0528)  Weight: [83.462 kg (184 lb)] 83.462 kg (184 lb) (04/06 1230)  Intake/Output from previous day:   Intake/Output Summary (Last 24 hours) at 01/19/14 0718 Last data filed at 01/19/14 0606   Gross per 24 hour   Intake  5201.67 ml   Output  6385 ml   Net  -1183.33 ml    Labs:   Recent Labs   01/19/14 0353   HGB  11.9*     Recent Labs   01/19/14 0353   WBC  12.9*   RBC  3.91*   HCT  34.1*   PLT  331     Recent Labs   01/19/14 0353   NA  134*   K  3.4*   CL  97   CO2  23   BUN  6   CREATININE  0.79   GLUCOSE  129*   CALCIUM  8.7    EXAM  General - Patient is Alert and Oriented  Extremity - Neurologically intact  Intact pulses distally  Dorsiflexion/Plantar flexion intact  Compartment soft  Dressing - dressing C/D/I  Motor Function - intact, moving foot and toes well on exam.  Hemovac pulled without difficulty.  Past Medical History   Diagnosis  Date   .  Allergy    .  Heart murmur    .  Anxiety    .  Hypertension    .  Sinus problem    .  Arthritis      OA AND PAIN RT KNEE    Assessment/Plan:  1 Day Post-Op Procedure(s) (LRB):  RIGHT TOTAL KNEE ARTHROPLASTY  Principal Problem:  OA (osteoarthritis) of knee   Estimated body mass index is 26.4 kg/(m^2) as calculated from the following:  Height as of this  encounter: 5\' 10"  (1.778 m).  Weight as of this encounter: 83.462 kg (184 lb).  Advance diet  Up with therapy  Plan for discharge tomorrow  DVT Prophylaxis - Xarelto  Weight-Bearing as tolerated  D/C O2 and Pulse OX and try on Room Air  Doing great. Will continue with PT and CPM. Plan for discharge home tomorrow with HHPT.   Lander Eslick Ander Purpura

## 2014-01-19 NOTE — Discharge Instructions (Addendum)
° °Dr. Frank Aluisio °Total Joint Specialist °[facility] °[address] °[place], [place] [address] °[phone] ° °TOTAL KNEE REPLACEMENT POSTOPERATIVE DIRECTIONS ° ° ° °Knee Rehabilitation, Guidelines Following Surgery  °Results after knee surgery are often greatly improved when you follow the exercise, range of motion and muscle strengthening exercises prescribed by your doctor. Safety measures are also important to protect the knee from further injury. Any time any of these exercises cause you to have increased pain or swelling in your knee joint, decrease the amount until you are comfortable again and slowly increase them. If you have problems or questions, call your caregiver or physical therapist for advice.  ° °HOME CARE INSTRUCTIONS  °Remove items at home which could result in a fall. This includes throw rugs or furniture in walking pathways.  °Continue medications as instructed at time of discharge. °You may have some home medications which will be placed on hold until you complete the course of blood thinner medication.  °You may start showering once you are discharged home but do not submerge the incision under water. Just pat the incision dry and apply a dry gauze dressing on daily. °Walk with walker as instructed.  °You may resume a sexual relationship in one month or when given the OK by  your doctor.  °· Use walker as long as suggested by your caregivers. °· Avoid periods of inactivity such as sitting longer than an hour when not asleep. This helps prevent blood clots.  °You may put full weight on your legs and walk as much as is comfortable.  °You may return to work once you are cleared by your doctor.  °Do not drive a car for 6 weeks or until released by you surgeon.  °· Do not drive while taking narcotics.  °Wear the elastic stockings for three weeks following surgery during the day but you may remove then at night. °Make sure you keep all of your appointments after your  operation with all of your doctors and caregivers. You should call the office at the above phone number and make an appointment for approximately two weeks after the date of your surgery. °Change the dressing daily and reapply a dry dressing each time. °Please pick up a stool softener and laxative for home use as long as you are requiring pain medications. °· Continue to use ice on the knee for pain and swelling from surgery. You may notice swelling that will progress down to the foot and ankle.  This is normal after surgery.  Elevate the leg when you are not up walking on it.   °It is important for you to complete the blood thinner medication as prescribed by your doctor. °· Continue to use the breathing machine which will help keep your temperature down.  It is common for your temperature to cycle up and down following surgery, especially at night when you are not up moving around and exerting yourself.  The breathing machine keeps your lungs expanded and your temperature down. ° °RANGE OF MOTION AND STRENGTHENING EXERCISES  °Rehabilitation of the knee is important following a knee injury or an operation. After just a few days of immobilization, the muscles of the thigh which control the knee become weakened and shrink (atrophy). Knee exercises are designed to build up the tone and strength of the thigh muscles and to improve knee motion. Often times heat used for twenty to thirty minutes before working out will loosen up your tissues and help with improving the   range of motion but do not use heat for the first two weeks following surgery. These exercises can be done on a training (exercise) mat, on the floor, on a table or on a bed. Use what ever works the best and is most comfortable for you Knee exercises include:  °Leg Lifts - While your knee is still immobilized in a splint or cast, you can do straight leg raises. Lift the leg to 60 degrees, hold for 3 sec, and slowly lower the leg. Repeat 10-20 times 2-3  times daily. Perform this exercise against resistance later as your knee gets better.  °Quad and Hamstring Sets - Tighten up the muscle on the front of the thigh (Quad) and hold for 5-10 sec. Repeat this 10-20 times hourly. Hamstring sets are done by pushing the foot backward against an object and holding for 5-10 sec. Repeat as with quad sets.  °A rehabilitation program following serious knee injuries can speed recovery and prevent re-injury in the future due to weakened muscles. Contact your doctor or a physical therapist for more information on knee rehabilitation.  ° °SKILLED REHAB INSTRUCTIONS: °If the patient is transferred to a skilled rehab facility following release from the hospital, a list of the current medications will be sent to the facility for the patient to continue.  When discharged from the skilled rehab facility, please have the facility set up the patient's Home Health Physical Therapy prior to being released. Also, the skilled facility will be responsible for providing the patient with their medications at time of release from the facility to include their pain medication, the muscle relaxants, and their blood thinner medication. If the patient is still at the rehab facility at time of the two week follow up appointment, the skilled rehab facility will also need to assist the patient in arranging follow up appointment in our office and any transportation needs. ° °MAKE SURE YOU:  °Understand these instructions.  °Will watch your condition.  °Will get help right away if you are not doing well or get worse.  ° ° °Pick up stool softner and laxative for home. °Do not submerge incision under water. °May shower. °Continue to use ice for pain and swelling from surgery. ° °======================================================= ° ° °Information on my medicine - XARELTO® (Rivaroxaban) ° °This medication education was reviewed with me or my healthcare representative as part of my discharge preparation.   The pharmacist that spoke with me during my hospital stay was:  Absher, Randall K, RPH ° °Why was Xarelto® prescribed for you? °Xarelto® was prescribed for you to reduce the risk of blood clots forming after orthopedic surgery. The medical term for these abnormal blood clots is venous thromboembolism (VTE). ° °What do you need to know about xarelto® ? °Take your Xarelto® ONCE DAILY at the same time every day. °You may take it either with or without food. ° °If you have difficulty swallowing the tablet whole, you may crush it and mix in applesauce just prior to taking your dose. ° °Take Xarelto® exactly as prescribed by your doctor and DO NOT stop taking Xarelto® without talking to the doctor who prescribed the medication.  Stopping without other VTE prevention medication to take the place of Xarelto® may increase your risk of developing a clot. ° °After discharge, you should have regular check-up appointments with your healthcare provider that is prescribing your Xarelto®.   ° °What do you do if you miss a dose? °If you miss a dose, take it as soon as   you remember on the same day then continue your regularly scheduled once daily regimen the next day. Do not take two doses of Xarelto® on the same day.  ° °Important Safety Information °A possible side effect of Xarelto® is bleeding. You should call your healthcare provider right away if you experience any of the following: °  Bleeding from an injury or your nose that does not stop. °  Unusual colored urine (red or dark brown) or unusual colored stools (red or black). °  Unusual bruising for unknown reasons. °  A serious fall or if you hit your head (even if there is no bleeding). ° °Some medicines may interact with Xarelto® and might increase your risk of bleeding while on Xarelto®. To help avoid this, consult your healthcare provider or pharmacist prior to using any new prescription or non-prescription medications, including herbals, vitamins, non-steroidal  anti-inflammatory drugs (NSAIDs) and supplements. ° °This website has more information on Xarelto®: www.xarelto.com. ° ° ° ° ° ° ° ° °

## 2014-01-19 NOTE — Care Management Note (Signed)
    Page 1 of 2   01/19/2014     1:55:59 PM   CARE MANAGEMENT NOTE 01/19/2014  Patient:  Julian Fowler, Julian Fowler   Account Number:  [id number]  Date Initiated:  01/19/2014  Documentation initiated by:  [facility]  Subjective/Objective Assessment:   adm: [facility]     Action/Plan:   discharge planning   Anticipated DC Date:  01/20/2014   Anticipated DC Plan:  [place]  CM consult      [facility] Choice  HOME HEALTH   Choice offered to / List presented to:  C-1 Patient        [place] arranged  [facility]      [place].   Status of service:  Completed, signed off Medicare Important Message given?   (If response is "NO", the following Medicare IM given date fields will be blank) Date Medicare IM given:   Date Additional Medicare IM given:    Discharge Disposition:  [place]  Per UR Regulation:    If discussed at Long Length of Stay Meetings, dates discussed:    Comments:  01/19/14 13:45 CM met with pt in room for choice for HHPT. Pt chooses [facility] and requests Verl Dicker as his physical therapist.  CM stated request for specific PT would be made but could not promise she would be available.  Pt will not be recuperating at [place] address.  [place] services need to be rendered at [address] [place], [place] [address].  Pt contact number is his wife's, Julian Fowler [phone] or home: [phone].  Pt states he does not need any DME as he has a walker, 3n1, and cane at home.  Referral called to [facility] rep, Erasmo Downer with notification of address and contact number changes.  No other CM needs were communicated. Mariane Masters, BSN, CM [phone].

## 2014-01-20 LAB — CBC
HEMATOCRIT: 30.1 % — AB (ref 39.0–52.0)
HEMOGLOBIN: 10.4 g/dL — AB (ref 13.0–17.0)
MCH: 28.7 pg (ref 26.0–34.0)
MCHC: 34.6 g/dL (ref 30.0–36.0)
MCV: 83.1 fL (ref 78.0–100.0)
Platelets: 309 10*3/uL (ref 150–400)
RBC: 3.62 MIL/uL — AB (ref 4.22–5.81)
RDW: 16.1 % — ABNORMAL HIGH (ref 11.5–15.5)
WBC: 13.3 10*3/uL — ABNORMAL HIGH (ref 4.0–10.5)

## 2014-01-20 LAB — BASIC METABOLIC PANEL
BUN: 15 mg/dL (ref 6–23)
CO2: 24 meq/L (ref 19–32)
Calcium: 8.5 mg/dL (ref 8.4–10.5)
Chloride: 99 mEq/L (ref 96–112)
Creatinine, Ser: 0.67 mg/dL (ref 0.50–1.35)
GFR calc Af Amer: >90 mL/min (ref >90)
GLUCOSE: 128 mg/dL — AB (ref 70–99)
POTASSIUM: 3.8 meq/L (ref 3.7–5.3)
Sodium: 134 mEq/L — ABNORMAL LOW (ref 137–147)

## 2014-01-20 NOTE — Discharge Summary (Signed)
Physician Discharge Summary   Patient ID: Julian Fowler MRN: [id number] DOB/AGE: [birth year] 79 y.o.  Admit date: 01/18/2014 Discharge date: 01/20/2014  Primary Diagnosis: Osteoarthritis, right knee              S/P right total knee arthroplasty  Admission Diagnoses:  Past Medical History  Diagnosis Date  . GERD (gastroesophageal reflux disease)   . Hyperlipidemia     under control  . Hypertension December 29, 2009    renal artery Doppler , normal  12/2009  . Abdominal aorta injury     Normal size, ultrasound, March 17,2011  . Asbestos exposure Jan 2001    Hx of asbestos related pleural plaques  . Overactive bladder   . ED (erectile dysfunction)   . Arthritis   . Hearing loss in left ear   . Allergic rhinitis   . Ejection fraction     60%, echo, 2008, mild RV dysfunction  . Easy bruisability   . BPH (benign prostatic hyperplasia)   . History of shingles     "mild"  . H/O hiatal hernia   . Tinnitus   . Balance problem   . Complication of anesthesia     "trouble waking up"   Discharge Diagnoses:   Active Problems:   OA (osteoarthritis) of knee  Estimated body mass index is 31.59 kg/(m^2) as calculated from the following:   Height as of this encounter: 5' 9"  (1.753 m).   Weight as of this encounter: 97.07 kg (214 lb).  Procedure:  Procedure(s) (LRB): RIGHT TOTAL KNEE ARTHROPLASTY (Right)   Consults: None  HPI:  The patient is a 79 year old male presenting for his knee pain. The patient comes in several months out from left total knee arthroplasty. The patient states that he is doing well (mainly notes a lot swelling and redness) at this time. They are currently on no medication for their pain. The patient is currently doing home exercise program (swimming/water aerobics). The opposite, right, knee continues to be a problem. He has popping and cracking. The pain has increased especially since the left knee is now doing so well. He has improved greatly with the left knee  replacement and is now felt he would benefit from getting the other knee done at this time. He is ready to proceed with the right knee surgery.  They have been treated conservatively in the past for the above stated problem and despite conservative measures, they continue to have progressive pain and severe functional limitations and dysfunction. They have failed non-operative management including home exercise, medications, and injections. It is felt that they would benefit from undergoing total joint replacement. Risks and benefits of the procedure have been discussed with the patient and they elect to proceed with surgery. There are no active contraindications to surgery such as ongoing infection   Laboratory Data: Admission on 01/18/2014, Discharged on 01/20/2014  Component Date Value Ref Range Status  . ABO/RH(D) 01/18/2014 A POS   Final  . Antibody Screen 01/18/2014 NEG   Final  . Sample Expiration 01/18/2014 01/21/2014   Final  . WBC 01/19/2014 14.2* 4.0 - 10.5 K/uL Final  . RBC 01/19/2014 4.10* 4.22 - 5.81 MIL/uL Final  . Hemoglobin 01/19/2014 11.7* 13.0 - 17.0 g/dL Final  . HCT 01/19/2014 34.0* 39.0 - 52.0 % Final  . MCV 01/19/2014 82.9  78.0 - 100.0 fL Final  . MCH 01/19/2014 28.5  26.0 - 34.0 pg Final  . MCHC 01/19/2014 34.4  30.0 - 36.0 g/dL  Final  . RDW 01/19/2014 15.7* 11.5 - 15.5 % Final  . Platelets 01/19/2014 341  150 - 400 K/uL Final  . Sodium 01/19/2014 131* 137 - 147 mEq/L Final  . Potassium 01/19/2014 3.8  3.7 - 5.3 mEq/L Final  . Chloride 01/19/2014 95* 96 - 112 mEq/L Final  . CO2 01/19/2014 24  19 - 32 mEq/L Final  . Glucose, Bld 01/19/2014 141* 70 - 99 mg/dL Final  . BUN 01/19/2014 12  6 - 23 mg/dL Final  . Creatinine, Ser 01/19/2014 0.74  0.50 - 1.35 mg/dL Final  . Calcium 01/19/2014 8.4  8.4 - 10.5 mg/dL Final  . GFR calc non Af Amer 01/19/2014 87* >90 mL/min Final  . GFR calc Af Amer 01/19/2014 >90  >90 mL/min Final   Comment: (NOTE)                           The eGFR has been calculated using the CKD EPI equation.                          This calculation has not been validated in all clinical situations.                          eGFR's persistently <90 mL/min signify possible Chronic Kidney                          Disease.  . WBC 01/20/2014 13.3* 4.0 - 10.5 K/uL Final  . RBC 01/20/2014 3.62* 4.22 - 5.81 MIL/uL Final  . Hemoglobin 01/20/2014 10.4* 13.0 - 17.0 g/dL Final  . HCT 01/20/2014 30.1* 39.0 - 52.0 % Final  . MCV 01/20/2014 83.1  78.0 - 100.0 fL Final  . MCH 01/20/2014 28.7  26.0 - 34.0 pg Final  . MCHC 01/20/2014 34.6  30.0 - 36.0 g/dL Final  . RDW 01/20/2014 16.1* 11.5 - 15.5 % Final  . Platelets 01/20/2014 309  150 - 400 K/uL Final  . Sodium 01/20/2014 134* 137 - 147 mEq/L Final  . Potassium 01/20/2014 3.8  3.7 - 5.3 mEq/L Final  . Chloride 01/20/2014 99  96 - 112 mEq/L Final  . CO2 01/20/2014 24  19 - 32 mEq/L Final  . Glucose, Bld 01/20/2014 128* 70 - 99 mg/dL Final  . BUN 01/20/2014 15  6 - 23 mg/dL Final  . Creatinine, Ser 01/20/2014 0.67  0.50 - 1.35 mg/dL Final  . Calcium 01/20/2014 8.5  8.4 - 10.5 mg/dL Final  . GFR calc non Af Amer 01/20/2014 >90  >90 mL/min Final  . GFR calc Af Amer 01/20/2014 >90  >90 mL/min Final   Comment: (NOTE)                          The eGFR has been calculated using the CKD EPI equation.                          This calculation has not been validated in all clinical situations.                          eGFR's persistently <90 mL/min signify possible Chronic Kidney  Disease.  Hospital Outpatient Visit on 01/11/2014  Component Date Value Ref Range Status  . MRSA, PCR 01/11/2014 NEGATIVE  NEGATIVE Final  . Staphylococcus aureus 01/11/2014 NEGATIVE  NEGATIVE Final   Comment:                                 The Xpert SA Assay (FDA                          approved for NASAL specimens                          in patients over 72 years of age),                          is  one component of                          a comprehensive surveillance                          program.  Test performance has                          been validated by [employer] for patients greater                          than or equal to 66 year old.                          It is not intended                          to diagnose infection nor to                          guide or monitor treatment.  Marland Kitchen aPTT 01/11/2014 31  24 - 37 seconds Final  . WBC 01/11/2014 11.3* 4.0 - 10.5 K/uL Final  . RBC 01/11/2014 4.71  4.22 - 5.81 MIL/uL Final  . Hemoglobin 01/11/2014 13.4  13.0 - 17.0 g/dL Final  . HCT 01/11/2014 39.6  39.0 - 52.0 % Final  . MCV 01/11/2014 84.1  78.0 - 100.0 fL Final  . MCH 01/11/2014 28.5  26.0 - 34.0 pg Final  . MCHC 01/11/2014 33.8  30.0 - 36.0 g/dL Final  . RDW 01/11/2014 16.0* 11.5 - 15.5 % Final  . Platelets 01/11/2014 350  150 - 400 K/uL Final  . Sodium 01/11/2014 134* 137 - 147 mEq/L Final  . Potassium 01/11/2014 4.5  3.7 - 5.3 mEq/L Final  . Chloride 01/11/2014 96  96 - 112 mEq/L Final  . CO2 01/11/2014 25  19 - 32 mEq/L Final  . Glucose, Bld 01/11/2014 97  70 - 99 mg/dL Final  . BUN 01/11/2014 18  6 - 23 mg/dL Final  . Creatinine, Ser 01/11/2014 0.79  0.50 - 1.35 mg/dL Final  . Calcium 01/11/2014 9.4  8.4 - 10.5 mg/dL Final  . Total Protein  01/11/2014 7.5  6.0 - 8.3 g/dL Final  . Albumin 01/11/2014 3.6  3.5 - 5.2 g/dL Final  . AST 01/11/2014 22  0 - 37 U/L Final  . ALT 01/11/2014 17  0 - 53 U/L Final  . Alkaline Phosphatase 01/11/2014 97  39 - 117 U/L Final  . Total Bilirubin 01/11/2014 0.3  0.3 - 1.2 mg/dL Final  . GFR calc non Af Amer 01/11/2014 85* >90 mL/min Final  . GFR calc Af Amer 01/11/2014 >90  >90 mL/min Final   Comment: (NOTE)                          The eGFR has been calculated using the CKD EPI equation.                          This calculation has not been validated in all clinical situations.                           eGFR's persistently <90 mL/min signify possible Chronic Kidney                          Disease.  Marland Kitchen Prothrombin Time 01/11/2014 14.0  11.6 - 15.2 seconds Final  . INR 01/11/2014 1.10  0.00 - 1.49 Final  . Color, Urine 01/11/2014 YELLOW  YELLOW Final  . APPearance 01/11/2014 CLEAR  CLEAR Final  . Specific Gravity, Urine 01/11/2014 1.017  1.005 - 1.030 Final  . pH 01/11/2014 6.0  5.0 - 8.0 Final  . Glucose, UA 01/11/2014 NEGATIVE  NEGATIVE mg/dL Final  . Hgb urine dipstick 01/11/2014 NEGATIVE  NEGATIVE Final  . Bilirubin Urine 01/11/2014 NEGATIVE  NEGATIVE Final  . Ketones, ur 01/11/2014 NEGATIVE  NEGATIVE mg/dL Final  . Protein, ur 01/11/2014 NEGATIVE  NEGATIVE mg/dL Final  . Urobilinogen, UA 01/11/2014 0.2  0.0 - 1.0 mg/dL Final  . Nitrite 01/11/2014 NEGATIVE  NEGATIVE Final  . Leukocytes, UA 01/11/2014 NEGATIVE  NEGATIVE Final   MICROSCOPIC NOT DONE ON URINES WITH NEGATIVE PROTEIN, BLOOD, LEUKOCYTES, NITRITE, OR GLUCOSE <1000 mg/dL.     X-Rays:No results found.  EKG: Orders placed in visit on 06/26/13  . EKG 12-LEAD     Hospital Course: BOLTON CANUPP is a 79 y.o. who was admitted to [facility]. They were brought to the operating room on 01/18/2014 and underwent Procedure(s): RIGHT TOTAL KNEE ARTHROPLASTY.  Patient tolerated the procedure well and was later transferred to the recovery room and then to the orthopaedic floor for postoperative care.  They were given PO and IV analgesics for pain control following their surgery.  They were given 24 hours of postoperative antibiotics of  Anti-infectives   Start     Dose/Rate Route Frequency Ordered Stop   01/18/14 1300  ceFAZolin (ANCEF) IVPB 2 g/50 mL premix     2 g 100 mL/hr over 30 Minutes Intravenous Every 6 hours 01/18/14 1047 01/18/14 1841   01/18/14 0515  ceFAZolin (ANCEF) IVPB 2 g/50 mL premix     2 g 100 mL/hr over 30 Minutes Intravenous On call to O.R. 01/18/14 6387 01/18/14 0708     and started on DVT  prophylaxis in the form of Xarelto.   PT and OT were ordered for total joint protocol.  Discharge planning consulted to help with postop disposition and equipment needs.  Patient had a  fair night on the evening of surgery.  They started to get up OOB with therapy on day one. Hemovac drain was pulled without difficulty.  Continued to work with therapy into day two.  Dressing was changed on day two and the incision was clean and dry.  The patient had progressed with therapy and meeting their goals.  Incision was healing well.  Patient was seen in rounds and was ready to go home.   Discharge Medications: Prior to Admission medications   Medication Sig Start Date End Date Taking? Authorizing Provider  acetaminophen (TYLENOL) 325 MG tablet Take 325 mg by mouth every 6 (six) hours as needed for pain.    Yes Historical Provider, MD  amLODipine (NORVASC) 2.5 MG tablet Take 2.5 mg by mouth every morning.   Yes Historical Provider, MD  dextromethorphan-guaiFENesin (MUCINEX DM) 30-600 MG per 12 hr tablet Take 1 tablet by mouth 2 (two) times daily.   Yes Historical Provider, MD  diltiazem (CARDIZEM CD) 120 MG 24 hr capsule Take 1 capsule (120 mg total) by mouth 2 (two) times daily. 01/01/14  Yes Carlena Bjornstad, MD  fexofenadine (ALLEGRA) 180 MG tablet Take 180 mg by mouth daily as needed. For allergies   Yes Historical Provider, MD  fluticasone (FLONASE) 50 MCG/ACT nasal spray Place 2 sprays into both nostrils daily.   Yes Historical Provider, MD  furosemide (LASIX) 40 MG tablet Take 1 tablet by mouth daily. 09/17/13  Yes Historical Provider, MD  latanoprost (XALATAN) 0.005 % ophthalmic solution Place 1 drop into both eyes at bedtime.  09/01/12  Yes Historical Provider, MD  LORazepam (ATIVAN) 1 MG tablet Take 1-2 mg by mouth at bedtime as needed for sleep.   Yes Historical Provider, MD  losartan (COZAAR) 100 MG tablet Take 100 mg by mouth every morning.    Yes Historical Provider, MD  omeprazole (PRILOSEC) 20 MG  capsule Take 20 mg by mouth daily. Take 1 tablet once daily thirty minutes before a meal   Yes Historical Provider, MD  potassium chloride SA (K-DUR,KLOR-CON) 20 MEQ tablet Take 1 tablet (20 mEq total) by mouth daily. 09/14/13  Yes Carlena Bjornstad, MD  pravastatin (PRAVACHOL) 20 MG tablet Take 20 mg by mouth at bedtime.     Yes Historical Provider, MD  zolpidem (AMBIEN) 10 MG tablet Take 1 tablet by mouth at bedtime. 09/17/13  Yes Historical Provider, MD  docusate sodium 100 MG CAPS Take 100 mg by mouth 2 (two) times daily. 01/19/14   Nargis Abrams Renelda Loma, PA-C  HYDROmorphone (DILAUDID) 2 MG tablet Take 1-2 tablets (2-4 mg total) by mouth every 4 (four) hours as needed for severe pain. 01/19/14   Debera Sterba Renelda Loma, PA-C  methocarbamol (ROBAXIN) 500 MG tablet Take 1 tablet (500 mg total) by mouth every 6 (six) hours as needed for muscle spasms. 01/19/14   Latitia Housewright Renelda Loma, PA-C  rivaroxaban (XARELTO) 10 MG TABS tablet Take 1 tablet (10 mg total) by mouth daily with breakfast. 01/19/14   Kelise Kuch Renelda Loma, PA-C  traMADol (ULTRAM) 50 MG tablet Take 1-2 tablets (50-100 mg total) by mouth every 6 (six) hours as needed for moderate pain. 01/19/14   Mamie Diiorio Renelda Loma, PA-C    Diet: Cardiac diet Activity:WBAT Follow-up:in 2 weeks Disposition - Home Discharged Condition: stable   Discharge Orders   Future Appointments Provider Department Dept Phone   09/24/2014 9:00 AM Deneise Lever, MD [facility] [phone]   Future Orders Complete By Expires   Call MD /  Call 911  As directed    Change dressing  As directed    Constipation Prevention  As directed    Diet - low sodium heart healthy  As directed    Discharge instructions  As directed    Do not put a pillow under the knee. Place it under the heel.  As directed    Driving restrictions  As directed    Increase activity slowly as tolerated  As directed        Medication List    STOP taking these medications        calcium carbonate 600 MG Tabs tablet  Commonly known as:  OS-CAL     multivitamin with minerals Tabs tablet     naproxen sodium 220 MG tablet  Commonly known as:  ANAPROX     vitamin A 8000 UNIT capsule      TAKE these medications       acetaminophen 325 MG tablet  Commonly known as:  TYLENOL  Take 325 mg by mouth every 6 (six) hours as needed for pain.     amLODipine 2.5 MG tablet  Commonly known as:  NORVASC  Take 2.5 mg by mouth every morning.     dextromethorphan-guaiFENesin 30-600 MG per 12 hr tablet  Commonly known as:  MUCINEX DM  Take 1 tablet by mouth 2 (two) times daily.     diltiazem 120 MG 24 hr capsule  Commonly known as:  CARDIZEM CD  Take 1 capsule (120 mg total) by mouth 2 (two) times daily.     DSS 100 MG Caps  Take 100 mg by mouth 2 (two) times daily.     fexofenadine 180 MG tablet  Commonly known as:  ALLEGRA  Take 180 mg by mouth daily as needed. For allergies     fluticasone 50 MCG/ACT nasal spray  Commonly known as:  FLONASE  Place 2 sprays into both nostrils daily.     furosemide 40 MG tablet  Commonly known as:  LASIX  Take 1 tablet by mouth daily.     HYDROmorphone 2 MG tablet  Commonly known as:  DILAUDID  Take 1-2 tablets (2-4 mg total) by mouth every 4 (four) hours as needed for severe pain.     latanoprost 0.005 % ophthalmic solution  Commonly known as:  XALATAN  Place 1 drop into both eyes at bedtime.     LORazepam 1 MG tablet  Commonly known as:  ATIVAN  Take 1-2 mg by mouth at bedtime as needed for sleep.     losartan 100 MG tablet  Commonly known as:  COZAAR  Take 100 mg by mouth every morning.     methocarbamol 500 MG tablet  Commonly known as:  ROBAXIN  Take 1 tablet (500 mg total) by mouth every 6 (six) hours as needed for muscle spasms.     omeprazole 20 MG capsule  Commonly known as:  PRILOSEC  Take 20 mg by mouth daily. Take 1 tablet once daily thirty minutes before a meal     potassium chloride SA 20 MEQ  tablet  Commonly known as:  K-DUR,KLOR-CON  Take 1 tablet (20 mEq total) by mouth daily.     pravastatin 20 MG tablet  Commonly known as:  PRAVACHOL  Take 20 mg by mouth at bedtime.     rivaroxaban 10 MG Tabs tablet  Commonly known as:  XARELTO  Take 1 tablet (10 mg total) by mouth daily with breakfast.     traMADol  50 MG tablet  Commonly known as:  ULTRAM  Take 1-2 tablets (50-100 mg total) by mouth every 6 (six) hours as needed for moderate pain.     zolpidem 10 MG tablet  Commonly known as:  AMBIEN  Take 1 tablet by mouth at bedtime.           Follow-up Information   Follow up with Gearlean Alf, MD. Schedule an appointment as soon as possible for a visit on 02/02/2014. (Call [phone] tomorrow to make the appointment)    Specialty:  Orthopedic Surgery   Contact information:   [address] [place] [address] [phone]       Follow up with [facility]. (home health physical therapy)    Contact information:   [place] [address] [phone]       Signed: Malachy Chamber 01/20/2014, 10:14 AM

## 2014-01-20 NOTE — Progress Notes (Signed)
RN reviewed discharge instructions with patient and wife. All questions answered. Patient educated about pain management, xarelto, dressing changes, showering, follow up appointments, and contat info for MD and home health agency.   Paperwork and prescriptions given to patient.   NT rolled pateint down in wheelchair with all belongings and equipment.

## 2014-01-20 NOTE — Progress Notes (Signed)
Physical Therapy Treatment Patient Details Name: Julian Fowler MRN: [id number] DOB: 03-24-1935 Today's Date: 01/20/2014    History of Present Illness s/p R TKA     PT Comments    Frequent cues for safety during stair training. Instructed to not use cane until HHPT instructed safe to do so. Pt and wife agreed.   Follow Up Recommendations  Home health PT;Supervision for mobility/OOB     Equipment Recommendations  None recommended by PT    Recommendations for Other Services       Precautions / Restrictions Precautions Precautions: Knee;Fall Precaution Comments: safet, pt ids impulsive    Mobility  Bed Mobility         Supine to sit: Modified independent (Device/Increase time) Sit to supine: Modified independent (Device/Increase time)      Transfers   Equipment used: Rolling walker (2 wheeled) Transfers: Sit to/from Stand Sit to Stand: Supervision         General transfer comment: cues for hand placement and safe technique  Ambulation/Gait Ambulation/Gait assistance: Supervision Ambulation Distance (Feet): 600 Feet Assistive device: Rolling walker (2 wheeled) Gait Pattern/deviations: Step-through pattern     General Gait Details: cues for RW safety during turn and upward gaze   Stairs Stairs: Yes Stairs assistance: Min guard Stair Management: Step to pattern;Forwards;With cane;One rail Left Number of Stairs: 4 General stair comments: cues for safety as pt began to walk with just cane  Wheelchair Mobility    Modified Rankin (Stroke Patients Only)       Balance                                    Cognition   Behavior During Therapy: Impulsive                        Exercises Total Joint Exercises Ankle Circles/Pumps: AROM;Both;15 reps Short Arc Quad: AROM;Right;15 reps;Supine Heel Slides: AROM;Right;15 reps;Supine Straight Leg Raises: AROM;Strengthening;Right;10 reps;Supine Goniometric ROM: 10-85    General  Comments        Pertinent Vitals/Pain No pain    Home Living                      Prior Function            PT Goals (current goals can now be found in the care plan section) Progress towards PT goals: Progressing toward goals    Frequency  7X/week    PT Plan Current plan remains appropriate    Co-evaluation             End of Session Equipment Utilized During Treatment: Gait belt Activity Tolerance: Patient tolerated treatment well Patient left: in bed;with call bell/phone within reach;with family/visitor present     Time: 5638-7564 PT Time Calculation (min): 32 min  Charges:  $Gait Training: 8-22 mins $Therapeutic Exercise: 8-22 mins                    G Codes:      Claretha Cooper 01/20/2014, 2:13 PM

## 2014-01-20 NOTE — Progress Notes (Signed)
   Subjective: 2 Days Post-Op Procedure(s) (LRB): RIGHT TOTAL KNEE ARTHROPLASTY (Right) Patient reports pain as moderate.   Patient seen in rounds with Dr. Wynelle Link. Patient is well, and has had no acute complaints or problems other than increase in pain after PT yesterday. No issues overnight. No SOB or chest pain.  Plan is to go Home after hospital stay.  Objective: Vital signs in last 24 hours: Temp:  [97.9 F (36.6 C)-98.8 F (37.1 C)] 98.8 F (37.1 C) (04/08 0604) Pulse Rate:  [60-65] 65 (04/08 0604) Resp:  [14-17] 16 (04/08 0604) BP: (135-177)/(64-78) 147/69 mmHg (04/08 0642) SpO2:  [95 %-98 %] 96 % (04/08 0604)  Intake/Output from previous day:  Intake/Output Summary (Last 24 hours) at 01/20/14 0744 Last data filed at 01/20/14 0551  Gross per 24 hour  Intake 2033.75 ml  Output   1750 ml  Net 283.75 ml     Labs:  Recent Labs  01/19/14 0355 01/20/14 0355  HGB 11.7* 10.4*    Recent Labs  01/19/14 0355 01/20/14 0355  WBC 14.2* 13.3*  RBC 4.10* 3.62*  HCT 34.0* 30.1*  PLT 341 309    Recent Labs  01/19/14 0355 01/20/14 0355  NA 131* 134*  K 3.8 3.8  CL 95* 99  CO2 24 24  BUN 12 15  CREATININE 0.74 0.67  GLUCOSE 141* 128*  CALCIUM 8.4 8.5    EXAM General - Patient is Alert and Oriented Extremity - Neurologically intact Intact pulses distally Dorsiflexion/Plantar flexion intact Incision: no drainage No cellulitis present Compartment soft Dressing/Incision - clean, dry, no drainage Motor Function - intact, moving foot and toes well on exam.   Past Medical History  Diagnosis Date  . GERD (gastroesophageal reflux disease)   . Hyperlipidemia     under control  . Hypertension December 29, 2009    renal artery Doppler , normal  12/2009  . Abdominal aorta injury     Normal size, ultrasound, March 17,2011  . Asbestos exposure Jan 2001    Hx of asbestos related pleural plaques  . Overactive bladder   . ED (erectile dysfunction)   . Arthritis     . Hearing loss in left ear   . Allergic rhinitis   . Ejection fraction     60%, echo, 2008, mild RV dysfunction  . Easy bruisability   . BPH (benign prostatic hyperplasia)   . History of shingles     "mild"  . H/O hiatal hernia   . Tinnitus   . Balance problem   . Complication of anesthesia     "trouble waking up"    Assessment/Plan: 2 Days Post-Op Procedure(s) (LRB): RIGHT TOTAL KNEE ARTHROPLASTY (Right) Active Problems:   OA (osteoarthritis) of knee  Estimated body mass index is 31.59 kg/(m^2) as calculated from the following:   Height as of this encounter: 5\' 9"  (1.753 m).   Weight as of this encounter: 97.07 kg (214 lb). Advance diet Up with therapy D/C IV fluids Discharge home with home health  DVT Prophylaxis - Xarelto Weight-Bearing as tolerated   He is doing well. Had increase in pain after therapy but we discussed that that is to be expected. Will DC home today after morning session of therapy. Discharge instructions discussed.   Courtlynn Holloman Renelda Loma 01/20/2014, 7:44 AM

## 2014-01-21 DIAGNOSIS — I10 Essential (primary) hypertension: Secondary | ICD-10-CM | POA: Diagnosis not present

## 2014-01-21 DIAGNOSIS — Z96659 Presence of unspecified artificial knee joint: Secondary | ICD-10-CM | POA: Diagnosis not present

## 2014-01-21 DIAGNOSIS — Z471 Aftercare following joint replacement surgery: Secondary | ICD-10-CM | POA: Diagnosis not present

## 2014-01-21 DIAGNOSIS — IMO0001 Reserved for inherently not codable concepts without codable children: Secondary | ICD-10-CM | POA: Diagnosis not present

## 2014-01-22 DIAGNOSIS — IMO0001 Reserved for inherently not codable concepts without codable children: Secondary | ICD-10-CM | POA: Diagnosis not present

## 2014-01-22 DIAGNOSIS — I10 Essential (primary) hypertension: Secondary | ICD-10-CM | POA: Diagnosis not present

## 2014-01-22 DIAGNOSIS — Z96659 Presence of unspecified artificial knee joint: Secondary | ICD-10-CM | POA: Diagnosis not present

## 2014-01-22 DIAGNOSIS — Z471 Aftercare following joint replacement surgery: Secondary | ICD-10-CM | POA: Diagnosis not present

## 2014-01-25 DIAGNOSIS — Z96659 Presence of unspecified artificial knee joint: Secondary | ICD-10-CM | POA: Diagnosis not present

## 2014-01-25 DIAGNOSIS — IMO0001 Reserved for inherently not codable concepts without codable children: Secondary | ICD-10-CM | POA: Diagnosis not present

## 2014-01-25 DIAGNOSIS — I10 Essential (primary) hypertension: Secondary | ICD-10-CM | POA: Diagnosis not present

## 2014-01-25 DIAGNOSIS — Z471 Aftercare following joint replacement surgery: Secondary | ICD-10-CM | POA: Diagnosis not present

## 2014-01-27 DIAGNOSIS — IMO0001 Reserved for inherently not codable concepts without codable children: Secondary | ICD-10-CM | POA: Diagnosis not present

## 2014-01-27 DIAGNOSIS — Z96659 Presence of unspecified artificial knee joint: Secondary | ICD-10-CM | POA: Diagnosis not present

## 2014-01-27 DIAGNOSIS — Z471 Aftercare following joint replacement surgery: Secondary | ICD-10-CM | POA: Diagnosis not present

## 2014-01-27 DIAGNOSIS — I10 Essential (primary) hypertension: Secondary | ICD-10-CM | POA: Diagnosis not present

## 2014-01-29 DIAGNOSIS — I10 Essential (primary) hypertension: Secondary | ICD-10-CM | POA: Diagnosis not present

## 2014-01-29 DIAGNOSIS — IMO0001 Reserved for inherently not codable concepts without codable children: Secondary | ICD-10-CM | POA: Diagnosis not present

## 2014-01-29 DIAGNOSIS — Z96659 Presence of unspecified artificial knee joint: Secondary | ICD-10-CM | POA: Diagnosis not present

## 2014-01-29 DIAGNOSIS — Z471 Aftercare following joint replacement surgery: Secondary | ICD-10-CM | POA: Diagnosis not present

## 2014-02-01 DIAGNOSIS — I10 Essential (primary) hypertension: Secondary | ICD-10-CM | POA: Diagnosis not present

## 2014-02-01 DIAGNOSIS — Z471 Aftercare following joint replacement surgery: Secondary | ICD-10-CM | POA: Diagnosis not present

## 2014-02-01 DIAGNOSIS — IMO0001 Reserved for inherently not codable concepts without codable children: Secondary | ICD-10-CM | POA: Diagnosis not present

## 2014-02-01 DIAGNOSIS — Z96659 Presence of unspecified artificial knee joint: Secondary | ICD-10-CM | POA: Diagnosis not present

## 2014-02-03 DIAGNOSIS — I10 Essential (primary) hypertension: Secondary | ICD-10-CM | POA: Diagnosis not present

## 2014-02-03 DIAGNOSIS — Z471 Aftercare following joint replacement surgery: Secondary | ICD-10-CM | POA: Diagnosis not present

## 2014-02-03 DIAGNOSIS — Z96659 Presence of unspecified artificial knee joint: Secondary | ICD-10-CM | POA: Diagnosis not present

## 2014-02-03 DIAGNOSIS — IMO0001 Reserved for inherently not codable concepts without codable children: Secondary | ICD-10-CM | POA: Diagnosis not present

## 2014-02-05 DIAGNOSIS — Z471 Aftercare following joint replacement surgery: Secondary | ICD-10-CM | POA: Diagnosis not present

## 2014-02-05 DIAGNOSIS — Z96659 Presence of unspecified artificial knee joint: Secondary | ICD-10-CM | POA: Diagnosis not present

## 2014-02-05 DIAGNOSIS — IMO0001 Reserved for inherently not codable concepts without codable children: Secondary | ICD-10-CM | POA: Diagnosis not present

## 2014-02-05 DIAGNOSIS — I10 Essential (primary) hypertension: Secondary | ICD-10-CM | POA: Diagnosis not present

## 2014-02-08 DIAGNOSIS — M1710 Unilateral primary osteoarthritis, unspecified knee: Secondary | ICD-10-CM | POA: Diagnosis not present

## 2014-02-08 DIAGNOSIS — R262 Difficulty in walking, not elsewhere classified: Secondary | ICD-10-CM | POA: Diagnosis not present

## 2014-02-12 DIAGNOSIS — M1710 Unilateral primary osteoarthritis, unspecified knee: Secondary | ICD-10-CM | POA: Diagnosis not present

## 2014-02-12 DIAGNOSIS — R262 Difficulty in walking, not elsewhere classified: Secondary | ICD-10-CM | POA: Diagnosis not present

## 2014-02-15 DIAGNOSIS — R262 Difficulty in walking, not elsewhere classified: Secondary | ICD-10-CM | POA: Diagnosis not present

## 2014-02-15 DIAGNOSIS — M1710 Unilateral primary osteoarthritis, unspecified knee: Secondary | ICD-10-CM | POA: Diagnosis not present

## 2014-02-17 DIAGNOSIS — R262 Difficulty in walking, not elsewhere classified: Secondary | ICD-10-CM | POA: Diagnosis not present

## 2014-02-17 DIAGNOSIS — M1710 Unilateral primary osteoarthritis, unspecified knee: Secondary | ICD-10-CM | POA: Diagnosis not present

## 2014-02-19 DIAGNOSIS — R262 Difficulty in walking, not elsewhere classified: Secondary | ICD-10-CM | POA: Diagnosis not present

## 2014-02-19 DIAGNOSIS — M1710 Unilateral primary osteoarthritis, unspecified knee: Secondary | ICD-10-CM | POA: Diagnosis not present

## 2014-02-22 DIAGNOSIS — M1710 Unilateral primary osteoarthritis, unspecified knee: Secondary | ICD-10-CM | POA: Diagnosis not present

## 2014-02-22 DIAGNOSIS — R262 Difficulty in walking, not elsewhere classified: Secondary | ICD-10-CM | POA: Diagnosis not present

## 2014-02-23 DIAGNOSIS — Z96659 Presence of unspecified artificial knee joint: Secondary | ICD-10-CM | POA: Diagnosis not present

## 2014-03-02 DIAGNOSIS — H9319 Tinnitus, unspecified ear: Secondary | ICD-10-CM | POA: Diagnosis not present

## 2014-03-02 DIAGNOSIS — H833X9 Noise effects on inner ear, unspecified ear: Secondary | ICD-10-CM | POA: Diagnosis not present

## 2014-03-02 DIAGNOSIS — H905 Unspecified sensorineural hearing loss: Secondary | ICD-10-CM | POA: Diagnosis not present

## 2014-03-02 DIAGNOSIS — H903 Sensorineural hearing loss, bilateral: Secondary | ICD-10-CM | POA: Diagnosis not present

## 2014-03-02 DIAGNOSIS — I998 Other disorder of circulatory system: Secondary | ICD-10-CM | POA: Diagnosis not present

## 2014-03-03 DIAGNOSIS — Z471 Aftercare following joint replacement surgery: Secondary | ICD-10-CM | POA: Diagnosis not present

## 2014-03-03 DIAGNOSIS — Z96659 Presence of unspecified artificial knee joint: Secondary | ICD-10-CM | POA: Diagnosis not present

## 2014-03-03 DIAGNOSIS — S82009A Unspecified fracture of unspecified patella, initial encounter for closed fracture: Secondary | ICD-10-CM | POA: Diagnosis not present

## 2014-03-16 ENCOUNTER — Other Ambulatory Visit: Payer: Self-pay | Admitting: Internal Medicine

## 2014-03-16 DIAGNOSIS — Z96659 Presence of unspecified artificial knee joint: Secondary | ICD-10-CM | POA: Diagnosis not present

## 2014-03-16 DIAGNOSIS — S8290XD Unspecified fracture of unspecified lower leg, subsequent encounter for closed fracture with routine healing: Secondary | ICD-10-CM | POA: Diagnosis not present

## 2014-03-16 DIAGNOSIS — Z471 Aftercare following joint replacement surgery: Secondary | ICD-10-CM | POA: Diagnosis not present

## 2014-03-16 NOTE — Telephone Encounter (Signed)
CY, Please advise if okay to refill. Thanks.  

## 2014-03-16 NOTE — Telephone Encounter (Signed)
Ok to refill 

## 2014-03-18 NOTE — Telephone Encounter (Signed)
Ok to refill 

## 2014-03-19 NOTE — Telephone Encounter (Signed)
Called refill to pharmacy voicemail.  

## 2014-03-25 DIAGNOSIS — N529 Male erectile dysfunction, unspecified: Secondary | ICD-10-CM | POA: Diagnosis not present

## 2014-03-25 DIAGNOSIS — R7301 Impaired fasting glucose: Secondary | ICD-10-CM | POA: Diagnosis not present

## 2014-03-25 DIAGNOSIS — M1990 Unspecified osteoarthritis, unspecified site: Secondary | ICD-10-CM | POA: Diagnosis not present

## 2014-03-25 DIAGNOSIS — I10 Essential (primary) hypertension: Secondary | ICD-10-CM | POA: Diagnosis not present

## 2014-03-25 DIAGNOSIS — E782 Mixed hyperlipidemia: Secondary | ICD-10-CM | POA: Diagnosis not present

## 2014-03-25 DIAGNOSIS — E871 Hypo-osmolality and hyponatremia: Secondary | ICD-10-CM | POA: Diagnosis not present

## 2014-03-25 DIAGNOSIS — N400 Enlarged prostate without lower urinary tract symptoms: Secondary | ICD-10-CM | POA: Diagnosis not present

## 2014-03-25 DIAGNOSIS — I498 Other specified cardiac arrhythmias: Secondary | ICD-10-CM | POA: Diagnosis not present

## 2014-03-30 DIAGNOSIS — Z96659 Presence of unspecified artificial knee joint: Secondary | ICD-10-CM | POA: Diagnosis not present

## 2014-03-31 ENCOUNTER — Other Ambulatory Visit: Payer: Self-pay | Admitting: *Deleted

## 2014-03-31 MED ORDER — AMLODIPINE BESYLATE 2.5 MG PO TABS: 2.5000 mg | ORAL_TABLET | Freq: Every morning | ORAL | Status: DC

## 2014-04-02 DIAGNOSIS — IMO0002 Reserved for concepts with insufficient information to code with codable children: Secondary | ICD-10-CM | POA: Diagnosis not present

## 2014-04-02 DIAGNOSIS — I10 Essential (primary) hypertension: Secondary | ICD-10-CM | POA: Diagnosis not present

## 2014-04-02 DIAGNOSIS — R7301 Impaired fasting glucose: Secondary | ICD-10-CM | POA: Diagnosis not present

## 2014-04-02 DIAGNOSIS — E871 Hypo-osmolality and hyponatremia: Secondary | ICD-10-CM | POA: Diagnosis not present

## 2014-04-02 DIAGNOSIS — E782 Mixed hyperlipidemia: Secondary | ICD-10-CM | POA: Diagnosis not present

## 2014-04-02 DIAGNOSIS — N400 Enlarged prostate without lower urinary tract symptoms: Secondary | ICD-10-CM | POA: Diagnosis not present

## 2014-04-02 DIAGNOSIS — M1990 Unspecified osteoarthritis, unspecified site: Secondary | ICD-10-CM | POA: Diagnosis not present

## 2014-04-02 DIAGNOSIS — R609 Edema, unspecified: Secondary | ICD-10-CM | POA: Diagnosis not present

## 2014-04-09 ENCOUNTER — Other Ambulatory Visit (HOSPITAL_COMMUNITY): Payer: Self-pay | Admitting: Orthopedic Surgery

## 2014-04-09 ENCOUNTER — Ambulatory Visit (HOSPITAL_COMMUNITY)
Admission: RE | Admit: 2014-04-09 | Discharge: 2014-04-09 | Disposition: A | Discharge status: Home / Self Care | Payer: Medicare Other | Source: Ambulatory Visit | Attending: Cardiovascular Disease | Admitting: Cardiovascular Disease

## 2014-04-09 DIAGNOSIS — M7989 Other specified soft tissue disorders: Secondary | ICD-10-CM

## 2014-04-09 DIAGNOSIS — Z96659 Presence of unspecified artificial knee joint: Secondary | ICD-10-CM | POA: Diagnosis not present

## 2014-04-09 NOTE — Progress Notes (Signed)
Right Lower Extremity Venous Duplex Completed. No evidence for DVT or SVT. °Brianna L Mazza,RVT °

## 2014-04-13 DIAGNOSIS — M1710 Unilateral primary osteoarthritis, unspecified knee: Secondary | ICD-10-CM | POA: Diagnosis not present

## 2014-04-13 DIAGNOSIS — R262 Difficulty in walking, not elsewhere classified: Secondary | ICD-10-CM | POA: Diagnosis not present

## 2014-04-14 ENCOUNTER — Telehealth (HOSPITAL_COMMUNITY): Payer: Self-pay | Admitting: *Deleted

## 2014-05-11 DIAGNOSIS — H0260 Xanthelasma of unspecified eye, unspecified eyelid: Secondary | ICD-10-CM | POA: Diagnosis not present

## 2014-05-11 DIAGNOSIS — H4011X0 Primary open-angle glaucoma, stage unspecified: Secondary | ICD-10-CM | POA: Diagnosis not present

## 2014-05-11 DIAGNOSIS — H11159 Pinguecula, unspecified eye: Secondary | ICD-10-CM | POA: Diagnosis not present

## 2014-05-11 DIAGNOSIS — H409 Unspecified glaucoma: Secondary | ICD-10-CM | POA: Diagnosis not present

## 2014-05-13 DIAGNOSIS — H833X9 Noise effects on inner ear, unspecified ear: Secondary | ICD-10-CM | POA: Diagnosis not present

## 2014-05-13 DIAGNOSIS — H903 Sensorineural hearing loss, bilateral: Secondary | ICD-10-CM | POA: Diagnosis not present

## 2014-05-13 DIAGNOSIS — H9319 Tinnitus, unspecified ear: Secondary | ICD-10-CM | POA: Diagnosis not present

## 2014-05-13 DIAGNOSIS — H905 Unspecified sensorineural hearing loss: Secondary | ICD-10-CM | POA: Diagnosis not present

## 2014-05-24 ENCOUNTER — Other Ambulatory Visit (HOSPITAL_COMMUNITY): Payer: Self-pay | Admitting: Interventional Radiology

## 2014-05-24 DIAGNOSIS — I671 Cerebral aneurysm, nonruptured: Secondary | ICD-10-CM

## 2014-06-02 ENCOUNTER — Other Ambulatory Visit (HOSPITAL_COMMUNITY): Payer: Self-pay | Admitting: Interventional Radiology

## 2014-06-02 ENCOUNTER — Ambulatory Visit (HOSPITAL_COMMUNITY)
Admission: RE | Admit: 2014-06-02 | Discharge: 2014-06-02 | Disposition: A | Discharge status: Home / Self Care | Payer: Medicare Other | Source: Ambulatory Visit | Attending: Interventional Radiology | Admitting: Interventional Radiology

## 2014-06-02 DIAGNOSIS — H9319 Tinnitus, unspecified ear: Secondary | ICD-10-CM | POA: Diagnosis not present

## 2014-06-02 DIAGNOSIS — Z9889 Other specified postprocedural states: Secondary | ICD-10-CM | POA: Diagnosis not present

## 2014-06-02 DIAGNOSIS — I671 Cerebral aneurysm, nonruptured: Secondary | ICD-10-CM

## 2014-06-03 ENCOUNTER — Other Ambulatory Visit (HOSPITAL_COMMUNITY): Payer: Self-pay | Admitting: Interventional Radiology

## 2014-06-03 DIAGNOSIS — R269 Unspecified abnormalities of gait and mobility: Secondary | ICD-10-CM

## 2014-06-03 DIAGNOSIS — Q2730 Arteriovenous malformation, site unspecified: Secondary | ICD-10-CM

## 2014-06-03 DIAGNOSIS — R200 Anesthesia of skin: Secondary | ICD-10-CM

## 2014-06-03 DIAGNOSIS — H9319 Tinnitus, unspecified ear: Secondary | ICD-10-CM

## 2014-06-03 DIAGNOSIS — I770 Arteriovenous fistula, acquired: Secondary | ICD-10-CM

## 2014-06-03 DIAGNOSIS — R55 Syncope and collapse: Secondary | ICD-10-CM

## 2014-06-03 DIAGNOSIS — H539 Unspecified visual disturbance: Secondary | ICD-10-CM

## 2014-06-03 DIAGNOSIS — D333 Benign neoplasm of cranial nerves: Secondary | ICD-10-CM

## 2014-06-03 DIAGNOSIS — Q279 Congenital malformation of peripheral vascular system, unspecified: Secondary | ICD-10-CM

## 2014-06-08 ENCOUNTER — Other Ambulatory Visit (HOSPITAL_COMMUNITY): Payer: Self-pay | Admitting: Interventional Radiology

## 2014-06-08 ENCOUNTER — Telehealth (HOSPITAL_COMMUNITY): Payer: Self-pay | Admitting: Interventional Radiology

## 2014-06-08 DIAGNOSIS — Q282 Arteriovenous malformation of cerebral vessels: Secondary | ICD-10-CM

## 2014-06-08 NOTE — Telephone Encounter (Signed)
Called pt to schedule his MRI, pt said he was busy getting a new phone and would call me back. JM

## 2014-06-10 ENCOUNTER — Ambulatory Visit (HOSPITAL_COMMUNITY)
Admission: RE | Admit: 2014-06-10 | Discharge: 2014-06-10 | Disposition: A | Discharge status: Home / Self Care | Payer: Medicare Other | Source: Ambulatory Visit | Attending: Interventional Radiology | Admitting: Interventional Radiology

## 2014-06-10 DIAGNOSIS — Z9889 Other specified postprocedural states: Secondary | ICD-10-CM | POA: Insufficient documentation

## 2014-06-10 DIAGNOSIS — Q282 Arteriovenous malformation of cerebral vessels: Secondary | ICD-10-CM

## 2014-06-10 DIAGNOSIS — Z86011 Personal history of benign neoplasm of the brain: Secondary | ICD-10-CM | POA: Diagnosis not present

## 2014-06-10 DIAGNOSIS — I6529 Occlusion and stenosis of unspecified carotid artery: Secondary | ICD-10-CM | POA: Diagnosis not present

## 2014-06-10 DIAGNOSIS — H539 Unspecified visual disturbance: Secondary | ICD-10-CM | POA: Diagnosis not present

## 2014-06-10 DIAGNOSIS — H9319 Tinnitus, unspecified ear: Secondary | ICD-10-CM | POA: Diagnosis not present

## 2014-06-10 DIAGNOSIS — Z8679 Personal history of other diseases of the circulatory system: Secondary | ICD-10-CM | POA: Diagnosis not present

## 2014-06-10 DIAGNOSIS — R269 Unspecified abnormalities of gait and mobility: Secondary | ICD-10-CM

## 2014-06-10 DIAGNOSIS — Q279 Congenital malformation of peripheral vascular system, unspecified: Secondary | ICD-10-CM

## 2014-06-10 LAB — POCT I-STAT CREATININE: Creatinine, Ser: 0.9 mg/dL (ref 0.50–1.35)

## 2014-06-10 MED ORDER — GADOBENATE DIMEGLUMINE 529 MG/ML IV SOLN
20.0000 mL | Freq: Once | INTRAVENOUS | Status: AC | PRN
  Administered 2014-06-10: 20 mL via INTRAVENOUS

## 2014-06-15 ENCOUNTER — Other Ambulatory Visit (HOSPITAL_COMMUNITY): Payer: Self-pay | Admitting: Interventional Radiology

## 2014-06-15 DIAGNOSIS — H539 Unspecified visual disturbance: Secondary | ICD-10-CM

## 2014-06-15 DIAGNOSIS — I671 Cerebral aneurysm, nonruptured: Secondary | ICD-10-CM

## 2014-06-15 DIAGNOSIS — R269 Unspecified abnormalities of gait and mobility: Secondary | ICD-10-CM

## 2014-06-18 ENCOUNTER — Ambulatory Visit (HOSPITAL_COMMUNITY)
Admission: RE | Admit: 2014-06-18 | Discharge: 2014-06-18 | Disposition: A | Discharge status: Home / Self Care | Payer: Medicare Other | Source: Ambulatory Visit | Attending: Interventional Radiology | Admitting: Interventional Radiology

## 2014-06-18 DIAGNOSIS — I671 Cerebral aneurysm, nonruptured: Secondary | ICD-10-CM

## 2014-06-18 DIAGNOSIS — H539 Unspecified visual disturbance: Secondary | ICD-10-CM

## 2014-06-18 DIAGNOSIS — R269 Unspecified abnormalities of gait and mobility: Secondary | ICD-10-CM

## 2014-06-22 ENCOUNTER — Telehealth (HOSPITAL_COMMUNITY): Payer: Self-pay | Admitting: Interventional Radiology

## 2014-06-22 NOTE — Telephone Encounter (Signed)
Gave pt appt info for JM

## 2014-06-22 NOTE — Telephone Encounter (Signed)
Pt wanted me to ask Deveshwar if would be able to drive the next week after his surgery, if [facility], and if he needed to take Aspirin or Plavix. I spoke w/ Deveshwar and called the pt back. I spoke to the patient and informed him that per Deveshwar he would NOT be able to drive the week after the procedure. I told him that he would need to start Aspirin 325mg  7 days prior to procedure and that if his current exercise regimen did not provoke or worsen symptoms then he could continue as is. Pt states understanding. JM

## 2014-06-23 ENCOUNTER — Other Ambulatory Visit (HOSPITAL_COMMUNITY): Payer: Self-pay | Admitting: Interventional Radiology

## 2014-06-23 ENCOUNTER — Encounter (HOSPITAL_COMMUNITY): Payer: Self-pay

## 2014-06-23 DIAGNOSIS — H9319 Tinnitus, unspecified ear: Secondary | ICD-10-CM

## 2014-06-23 DIAGNOSIS — Q2730 Arteriovenous malformation, site unspecified: Secondary | ICD-10-CM

## 2014-06-25 ENCOUNTER — Ambulatory Visit (INDEPENDENT_AMBULATORY_CARE_PROVIDER_SITE_OTHER): Payer: Medicare Other | Admitting: Cardiology

## 2014-06-25 ENCOUNTER — Other Ambulatory Visit: Payer: Self-pay | Admitting: Radiology

## 2014-06-25 ENCOUNTER — Encounter: Payer: Self-pay | Admitting: Cardiology

## 2014-06-25 VITALS — BP 133/76 | HR 58 | Ht 69.0 in | Wt 217.0 lb

## 2014-06-25 DIAGNOSIS — I671 Cerebral aneurysm, nonruptured: Secondary | ICD-10-CM

## 2014-06-25 DIAGNOSIS — I471 Supraventricular tachycardia: Secondary | ICD-10-CM

## 2014-06-25 DIAGNOSIS — I498 Other specified cardiac arrhythmias: Secondary | ICD-10-CM

## 2014-06-25 DIAGNOSIS — I10 Essential (primary) hypertension: Secondary | ICD-10-CM | POA: Diagnosis not present

## 2014-06-25 NOTE — Assessment & Plan Note (Signed)
He is being treated by interventional radiology. He is scheduled to have any cerebral embolization done in the near future. His cardiac status is stable for this.

## 2014-06-25 NOTE — Progress Notes (Signed)
Patient ID: Julian Fowler, male   DOB: 09-05-1935, 79 y.o.   MRN: [id number]    HPI  Patient is seen to followup his overall cardiovascular status. He continues to swim daily. He has now had surgery on both knees and he is doing well. He has a cerebral AV fistula that will require embolization. Preparation is being made for this. Cardiac status is stable. His symptoms from this is a roaring sensation in his head.  Allergies  Allergen Reactions  . Codeine Other (See Comments)    REACTION: groggy  . Tape Other (See Comments)    Skin tears    Current Outpatient Prescriptions  Medication Sig Dispense Refill  . acetaminophen (TYLENOL) 325 MG tablet Take 325 mg by mouth every 6 (six) hours as needed for pain.       Marland Kitchen amLODipine (NORVASC) 2.5 MG tablet Take 1 tablet (2.5 mg total) by mouth every morning.  90 tablet  3  . aspirin 325 MG tablet Take 325 mg by mouth daily.      Marland Kitchen diltiazem (CARDIZEM CD) 120 MG 24 hr capsule Take 1 capsule (120 mg total) by mouth 2 (two) times daily.  180 capsule  3  . docusate sodium 100 MG CAPS Take 100 mg by mouth 2 (two) times daily.  10 capsule  0  . fexofenadine (ALLEGRA) 180 MG tablet Take 180 mg by mouth daily as needed. For allergies      . fluticasone (FLONASE) 50 MCG/ACT nasal spray Place 2 sprays into both nostrils daily.      . furosemide (LASIX) 40 MG tablet Take 1 tablet by mouth daily.      Marland Kitchen latanoprost (XALATAN) 0.005 % ophthalmic solution Place 1 drop into both eyes at bedtime.       Marland Kitchen LORazepam (ATIVAN) 1 MG tablet Take 1-2 mg by mouth at bedtime as needed for anxiety.      Marland Kitchen losartan (COZAAR) 100 MG tablet Take 100 mg by mouth every morning.       Marland Kitchen omeprazole (PRILOSEC) 20 MG capsule Take 20 mg by mouth daily. Take 1 tablet once daily thirty minutes before a meal      . potassium chloride SA (K-DUR,KLOR-CON) 20 MEQ tablet Take 1 tablet (20 mEq total) by mouth daily.  90 tablet  3  . pravastatin (PRAVACHOL) 20 MG tablet Take 20 mg by mouth at  bedtime.        Marland Kitchen zolpidem (AMBIEN) 10 MG tablet Take 10 mg by mouth at bedtime.        No current facility-administered medications for this visit.    History   Social History  . Marital Status: Married    Spouse Name: N/A    Number of Children: N/A  . Years of Education: N/A   Occupational History  . Not on file.   Social History Main Topics  . Smoking status: Former Smoker -- 1.50 packs/day for 30 years    Types: Cigarettes    Quit date: 10/15/1986  . Smokeless tobacco: Never Used  . Alcohol Use: No     Comment: Used to drink heavily at times  . Drug Use: No  . Sexual Activity: Not on file   Other Topics Concern  . Not on file   Social History Narrative   Co-dependent relationship with his 55 year old son who has drug and financial problems    Family History  Problem Relation Age of Onset  . Cancer Father  oral cancer  . Breast cancer Mother   . Heart attack Mother   . Hypertension Sister     Bypass x4  . Alcohol abuse Sister   . Alzheimer's disease Sister   . Kidney disease Sister     Past Medical History  Diagnosis Date  . GERD (gastroesophageal reflux disease)   . Hyperlipidemia     under control  . Hypertension December 29, 2009    renal artery Doppler , normal  12/2009  . Abdominal aorta injury     Normal size, ultrasound, March 17,2011  . Asbestos exposure Jan 2001    Hx of asbestos related pleural plaques  . Overactive bladder   . ED (erectile dysfunction)   . Arthritis   . Hearing loss in left ear   . Allergic rhinitis   . Ejection fraction     60%, echo, 2008, mild RV dysfunction  . Easy bruisability   . BPH (benign prostatic hyperplasia)   . History of shingles     "mild"  . H/O hiatal hernia   . Tinnitus   . Balance problem   . Complication of anesthesia     "trouble waking up"    Past Surgical History  Procedure Laterality Date  . Craniectomy for excision of acoustic neuroma  3/95  . Trigger finger release  2003    (thumb)  middle finger (2006)  . Cerebral embolization  12/2011    "radiation therapy-did not work"  . Brain surgery    . Total knee arthroplasty Left 07/06/2013    Procedure: LEFT TOTAL KNEE ARTHROPLASTY;  Surgeon: Gearlean Alf, MD;  Location: [facility];  Service: Orthopedics;  Laterality: Left;  . Cholecystectomy  6/98  . Back surgery  2014    herniated L1, L2   Dr Carloyn Manner  . Total knee arthroplasty Right 01/18/2014    Procedure: RIGHT TOTAL KNEE ARTHROPLASTY;  Surgeon: Gearlean Alf, MD;  Location: [facility];  Service: Orthopedics;  Laterality: Right;    Patient Active Problem List   Diagnosis Date Noted  . 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 Peripheral edema 10/15/2013  . Hyponatremia 07/07/2013  . Hypokalemia 07/07/2013  . OA (osteoarthritis) of knee 07/06/2013  . Preop cardiovascular exam 06/26/2013  . Chronic insomnia 09/29/2012  . Cardiac murmur 08/27/2012  . Hearing impaired 04/10/2012  . Easy bruisability   . Anxiety   . GERD (gastroesophageal reflux disease)   . Hyperlipidemia   . Abdominal aorta injury   . Asbestos exposure   . Overactive bladder   . ED (erectile dysfunction)   . SVT (supraventricular tachycardia)   . OSA (obstructive sleep apnea)   . Chest tightness   . Ejection fraction   . Hypertension 12/29/2009  . Syncope 09/14/2009  . COPD 07/15/2009  . TOBACCO ABUSE, HX OF 07/15/2009  . BACK PAIN 06/22/2009  . INTESTINAL GAS 06/22/2009  . 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 ALLERGIC RHINITIS 01/15/2008  . ECZEMA 11/18/2007  . ASTHMA, MILD 11/03/2007  . GERD 04/14/2007  . OVERACTIVE BLADDER 04/14/2007  . ARTHRITIS 04/14/2007  . KNEE PAIN, LEFT 04/14/2007    ROS   Patient denies fever, chills, headache, sweats, rash, change in vision, change in hearing, chest pain, cough, nausea vomiting, urinary symptoms. All other systems are reviewed and are negative other than the history of present illness.  PHYSICAL EXAM  He has gained  some weight because of his surgeries. He is beginning to lose it. He's here  with his wife. Head is atraumatic. Sclera and conjunctiva are normal. There is no jugulovenous distention. Lungs are clear. Respiratory effort is nonlabored. Cardiac exam reveals S1 and S2. The abdomen is soft. There is no peripheral edema.  Filed Vitals:   06/25/14 1018  BP: 133/76  Pulse: 58  Height: 5\' 9"  (1.753 m)  Weight: 217 lb (98.431 kg)  SpO2: 95%   EKG is done today reviewed by me. There sinus rhythm. No significant change.  ASSESSMENT & PLAN

## 2014-06-25 NOTE — Assessment & Plan Note (Signed)
He has not had any recurrent tachyarrhythmias. No change in therapy.

## 2014-06-25 NOTE — Assessment & Plan Note (Signed)
Blood pressure stable. No change in therapy.

## 2014-06-25 NOTE — Patient Instructions (Signed)
There were no changes to your medications. Continue as directed. Your physician wants you to follow up in: 6 months.  You will receive a reminder letter in the mail one-two months in advance.  If you don't receive a letter, please call our office to schedule the follow up appointment.

## 2014-07-01 ENCOUNTER — Encounter (HOSPITAL_COMMUNITY): Payer: Self-pay

## 2014-07-01 ENCOUNTER — Other Ambulatory Visit: Payer: Self-pay | Admitting: Radiology

## 2014-07-01 ENCOUNTER — Encounter (HOSPITAL_COMMUNITY)
Admission: RE | Admit: 2014-07-01 | Discharge: 2014-07-01 | Disposition: A | Discharge status: Home / Self Care | Payer: Medicare Other | Source: Ambulatory Visit | Attending: Interventional Radiology | Admitting: Interventional Radiology

## 2014-07-01 DIAGNOSIS — Z01812 Encounter for preprocedural laboratory examination: Secondary | ICD-10-CM | POA: Insufficient documentation

## 2014-07-01 DIAGNOSIS — Z01810 Encounter for preprocedural cardiovascular examination: Secondary | ICD-10-CM | POA: Diagnosis not present

## 2014-07-01 DIAGNOSIS — Z7982 Long term (current) use of aspirin: Secondary | ICD-10-CM | POA: Insufficient documentation

## 2014-07-01 DIAGNOSIS — I671 Cerebral aneurysm, nonruptured: Secondary | ICD-10-CM | POA: Insufficient documentation

## 2014-07-01 HISTORY — DX: Sleep apnea, unspecified: G47.30

## 2014-07-01 LAB — CBC WITH DIFFERENTIAL/PLATELET
BASOS ABS: 0 10*3/uL (ref 0.0–0.1)
Basophils Relative: 1 % (ref 0–1)
Eosinophils Absolute: 0.4 10*3/uL (ref 0.0–0.7)
Eosinophils Relative: 6 % — ABNORMAL HIGH (ref 0–5)
HCT: 41.2 % (ref 39.0–52.0)
Hemoglobin: 14.2 g/dL (ref 13.0–17.0)
LYMPHS PCT: 25 % (ref 12–46)
Lymphs Abs: 1.7 10*3/uL (ref 0.7–4.0)
MCH: 29.3 pg (ref 26.0–34.0)
MCHC: 34.5 g/dL (ref 30.0–36.0)
MCV: 85.1 fL (ref 78.0–100.0)
Monocytes Absolute: 1 10*3/uL (ref 0.1–1.0)
Monocytes Relative: 14 % — ABNORMAL HIGH (ref 3–12)
NEUTROS ABS: 3.7 10*3/uL (ref 1.7–7.7)
Neutrophils Relative %: 54 % (ref 43–77)
PLATELETS: 315 10*3/uL (ref 150–400)
RBC: 4.84 MIL/uL (ref 4.22–5.81)
RDW: 14.9 % (ref 11.5–15.5)
WBC: 6.7 10*3/uL (ref 4.0–10.5)

## 2014-07-01 LAB — PROTIME-INR
INR: 1 (ref 0.00–1.49)
Prothrombin Time: 13.2 seconds (ref 11.6–15.2)

## 2014-07-01 LAB — COMPREHENSIVE METABOLIC PANEL
ALBUMIN: 3.7 g/dL (ref 3.5–5.2)
ALT: 16 U/L (ref 0–53)
AST: 25 U/L (ref 0–37)
Alkaline Phosphatase: 100 U/L (ref 39–117)
Anion gap: 13 (ref 5–15)
BILIRUBIN TOTAL: 0.4 mg/dL (ref 0.3–1.2)
BUN: 13 mg/dL (ref 6–23)
CHLORIDE: 98 meq/L (ref 96–112)
CO2: 26 meq/L (ref 19–32)
Calcium: 9.5 mg/dL (ref 8.4–10.5)
Creatinine, Ser: 0.9 mg/dL (ref 0.50–1.35)
GFR calc Af Amer: >90 mL/min (ref >90)
GFR, EST NON AFRICAN AMERICAN: 80 mL/min — AB (ref >90)
Glucose, Bld: 85 mg/dL (ref 70–99)
Potassium: 4.3 mEq/L (ref 3.7–5.3)
SODIUM: 137 meq/L (ref 137–147)
Total Protein: 7.7 g/dL (ref 6.0–8.3)

## 2014-07-01 LAB — APTT: APTT: 31 s (ref 24–37)

## 2014-07-01 NOTE — Pre-Procedure Instructions (Addendum)
Julian Fowler  07/01/2014   Your procedure is scheduled on:  September 23  Report to [facility] Admitting at 06:30 AM.  Call this number if you have problems the morning of surgery: [phone]   Remember:   Do not eat food or drink liquids after midnight.   Take these medicines the morning of surgery with A SIP OF WATER: Tylenol (if needed), Amlodipine, Aspirin, Diltiazem, Allegra, Flonase, Eye drops, Omeprazole   STOP/ Do not take Aleve, Naproxen, Advil, Ibuprofen, Motrin, Vitamins, Herbs, or Supplements starting today   Do not wear jewelry, make-up or nail polish.  Do not wear lotions, powders, or perfumes. You may wear deodorant.  Do not shave 48 hours prior to surgery. Men may shave face and neck.  Do not bring valuables to the hospital.  [facility] is not responsible for any belongings or valuables.               Contacts, dentures or bridgework may not be worn into surgery.  Leave suitcase in the car. After surgery it may be brought to your room.  For patients admitted to the hospital, discharge time is determined by your treatment team.               Special Instructions: [place] - Preparing for Surgery  Before surgery, you can play an important role.  Because skin is not sterile, your skin needs to be as free of germs as possible.  You can reduce the number of germs on you skin by washing with CHG (chlorahexidine gluconate) soap before surgery.  CHG is an antiseptic cleaner which kills germs and bonds with the skin to continue killing germs even after washing.  Please DO NOT use if you have an allergy to CHG or antibacterial soaps.  If your skin becomes reddened/irritated stop using the CHG and inform your nurse when you arrive at Short Stay.  Do not shave (including legs and underarms) for at least 48 hours prior to the first CHG shower.  You may shave your face.  Please follow these instructions carefully:   1.  Shower with CHG Soap the night before  surgery and the morning of Surgery.  2.  If you choose to wash your hair, wash your hair first as usual with your normal shampoo.  3.  After you shampoo, rinse your hair and body thoroughly to remove the shampoo.  4.  Use CHG as you would any other liquid soap.  You can apply CHG directly to the skin and wash gently with scrungie or a clean washcloth.  5.  Apply the CHG Soap to your body ONLY FROM THE NECK DOWN.  Do not use on open wounds or open sores.  Avoid contact with your eyes, ears, mouth and genitals (private parts).  Wash genitals (private parts) with your normal soap.  6.  Wash thoroughly, paying special attention to the area where your surgery will be performed.  7.  Thoroughly rinse your body with warm water from the neck down.  8.  DO NOT shower/wash with your normal soap after using and rinsing off the CHG Soap.  9.  Pat yourself dry with a clean towel.            10.  Wear clean pajamas.            11.  Place clean sheets on your bed the night of your first shower and do not sleep with pets.  Day of Surgery  Do not apply any lotions the morning of surgery.  Please wear clean clothes to the hospital/surgery center.     Please read over the following fact sheets that you were given: Pain Booklet, Coughing and Deep Breathing and Surgical Site Infection Prevention

## 2014-07-01 NOTE — Progress Notes (Signed)
Spoke to [employer] regarding patient not having a prescription for plavix per Pam patient is not going to need plavix. Also, during PAT visit patient reports that a piece of skin got rubbed off of his left posterior arm just distal to his elbow patient requested I look at it. Noted ~ quarter size area that was missing skin that wound bed was pink in color, no signs of infection, no drainage. Advised patient to wash area is soap and water once home, leave uncovered to let area develop scab, and monitor for signs of infection. Discussed with patient need to call PCP if area developed redness or drainage. Patient verbalized understanding.

## 2014-07-02 NOTE — Progress Notes (Signed)
Anesthesia Chart Review:  Patient is a 79 year old male scheduled for cerebral arteriogram with probable dural arteriovenous fistula embolization by Dr. Estanislado Pandy.  History includes former smoker, SVT (according to Dr. Kae Heller note 06/25/14 note; on diltiazem), GERD, HLD, HTN, abdominal aorta injury (normal size by 2011 U/S), asbestos exposure '01, overactive bladder, ED, arthritis, hearing loss in left ear, bruises easily, hiatal hernia, BPH, OSA 10 years ago (no longer using CPAP since weight loss), balance problem, tinnitus, craniectomy for excision of acoustic neuroma '95, embolization of left occipital artery branches for cerebrovascular dural AVF '13, back surgery '14, left TKA '14, right TKA '5. PCP is listed as Dr. Judd Lien. Pulmonologist is Dr. Gwenette Greet. Cardiologist is Dr. Ron Parker felt patient was stable for this procedure from his standpoint.  EKG on 06/25/14 showed: SB, consider old anterior infarct.  Echo on 10/02/12 showed: - Left ventricle: The cavity size was normal. There was mild concentric hypertrophy. Systolic function was normal. The estimated ejection fraction was in the range of 55% to 60%. Wall motion was normal; there were no regional wall motion abnormalities. Doppler parameters are consistent with abnormal left ventricular relaxation (grade 1 diastolic dysfunction). - Aortic valve: Trileaflet; normal thickness, mildly calcified leaflets. Transvalvular velocity was within the normal range. There was no stenosis. - Mitral valve: Calcified annulus. Mildly thickened leaflets. - Left atrium: The atrium was mildly dilated. - Pulmonary arteries: Systolic pressure was within the normal range.  Nuclear stress test on 06/12/13 [facility]; scanned in [place]) showed: 1. Abnormal regadenoson myocardial perfusion with Tc-30m sestamibi imaging. 2. Medium-size, mild to moderate in intensity, fixed defect in the apical inferior, mid inferior, and mid inferoseptal and inferolateral regions, which  could represent myocardial scar with superimposed soft tissue attenuation. 3. Global left ventricular systolic function was normal, with an EF of 58%. (According to Dr. Kae Heller 06/26/13 office note, "He recently had a nuclear scan. I believe it was arranged by his primary physician for the question of clearance for knee surgery. Report gives the impression of some possible areas of scar. There was no ischemia. The description of the questionable abnormalities does not fit any definite coronary distribution. I suspect that this does not represent a significant problem.")  Abdominal ultrasound on 12/29/09 (scanned under Results Review tab) showed: Normal caliber abdominal aorta with mild nonobstructive plaque. Normal and symmetrical kidney size. Normal renal arteries bilaterally.  CXR on 09/24/13 showed: Stable bilateral chronic changes consistent with patient's history of asbestos exposure. No acute abnormalities identified.   Preoperative labs noted.   Patient has been cleared by his cardiologist. He tolerated TKA five months ago.  If no acute changes then I anticipate that he can proceed.     George Hugh [facility] Short Stay Center/Anesthesiology Phone [phone] 07/02/2014 5:47 PM

## 2014-07-05 ENCOUNTER — Other Ambulatory Visit: Payer: Self-pay | Admitting: Radiology

## 2014-07-06 ENCOUNTER — Other Ambulatory Visit: Payer: Self-pay | Admitting: Radiology

## 2014-07-06 DIAGNOSIS — M1990 Unspecified osteoarthritis, unspecified site: Secondary | ICD-10-CM | POA: Diagnosis not present

## 2014-07-06 DIAGNOSIS — R609 Edema, unspecified: Secondary | ICD-10-CM | POA: Diagnosis not present

## 2014-07-06 DIAGNOSIS — N400 Enlarged prostate without lower urinary tract symptoms: Secondary | ICD-10-CM | POA: Diagnosis not present

## 2014-07-06 DIAGNOSIS — E782 Mixed hyperlipidemia: Secondary | ICD-10-CM | POA: Diagnosis not present

## 2014-07-06 DIAGNOSIS — Z23 Encounter for immunization: Secondary | ICD-10-CM | POA: Diagnosis not present

## 2014-07-06 DIAGNOSIS — E871 Hypo-osmolality and hyponatremia: Secondary | ICD-10-CM | POA: Diagnosis not present

## 2014-07-06 DIAGNOSIS — I10 Essential (primary) hypertension: Secondary | ICD-10-CM | POA: Diagnosis not present

## 2014-07-06 DIAGNOSIS — IMO0002 Reserved for concepts with insufficient information to code with codable children: Secondary | ICD-10-CM | POA: Diagnosis not present

## 2014-07-07 ENCOUNTER — Ambulatory Visit (HOSPITAL_COMMUNITY)
Admission: RE | Admit: 2014-07-07 | Discharge: 2014-07-07 | Disposition: A | Discharge status: Home / Self Care | Payer: Medicare Other | Source: Ambulatory Visit | Attending: Interventional Radiology | Admitting: Interventional Radiology

## 2014-07-07 ENCOUNTER — Encounter (HOSPITAL_COMMUNITY)
Admission: RE | Disposition: A | Discharge status: Home / Self Care | Payer: Self-pay | Source: Ambulatory Visit | Attending: Interventional Radiology

## 2014-07-07 ENCOUNTER — Encounter (HOSPITAL_COMMUNITY): Payer: Medicare Other | Admitting: Vascular Surgery

## 2014-07-07 ENCOUNTER — Ambulatory Visit (HOSPITAL_COMMUNITY): Payer: Medicare Other | Admitting: Anesthesiology

## 2014-07-07 ENCOUNTER — Encounter (HOSPITAL_COMMUNITY): Payer: Self-pay | Admitting: *Deleted

## 2014-07-07 VITALS — BP 115/46 | HR 41 | Temp 97.4°F | Resp 18

## 2014-07-07 DIAGNOSIS — Z79899 Other long term (current) drug therapy: Secondary | ICD-10-CM | POA: Insufficient documentation

## 2014-07-07 DIAGNOSIS — E785 Hyperlipidemia, unspecified: Secondary | ICD-10-CM | POA: Insufficient documentation

## 2014-07-07 DIAGNOSIS — I10 Essential (primary) hypertension: Secondary | ICD-10-CM | POA: Insufficient documentation

## 2014-07-07 DIAGNOSIS — R209 Unspecified disturbances of skin sensation: Secondary | ICD-10-CM | POA: Insufficient documentation

## 2014-07-07 DIAGNOSIS — I671 Cerebral aneurysm, nonruptured: Secondary | ICD-10-CM | POA: Diagnosis not present

## 2014-07-07 DIAGNOSIS — G4730 Sleep apnea, unspecified: Secondary | ICD-10-CM | POA: Diagnosis not present

## 2014-07-07 DIAGNOSIS — R269 Unspecified abnormalities of gait and mobility: Secondary | ICD-10-CM | POA: Insufficient documentation

## 2014-07-07 DIAGNOSIS — Z87891 Personal history of nicotine dependence: Secondary | ICD-10-CM | POA: Insufficient documentation

## 2014-07-07 DIAGNOSIS — Z48812 Encounter for surgical aftercare following surgery on the circulatory system: Secondary | ICD-10-CM | POA: Diagnosis not present

## 2014-07-07 DIAGNOSIS — H9319 Tinnitus, unspecified ear: Secondary | ICD-10-CM

## 2014-07-07 DIAGNOSIS — K219 Gastro-esophageal reflux disease without esophagitis: Secondary | ICD-10-CM | POA: Insufficient documentation

## 2014-07-07 DIAGNOSIS — J309 Allergic rhinitis, unspecified: Secondary | ICD-10-CM | POA: Insufficient documentation

## 2014-07-07 DIAGNOSIS — Z7982 Long term (current) use of aspirin: Secondary | ICD-10-CM | POA: Diagnosis not present

## 2014-07-07 DIAGNOSIS — Q2730 Arteriovenous malformation, site unspecified: Secondary | ICD-10-CM

## 2014-07-07 HISTORY — PX: RADIOLOGY WITH ANESTHESIA: SHX6223

## 2014-07-07 SURGERY — RADIOLOGY WITH ANESTHESIA
Anesthesia: Monitor Anesthesia Care

## 2014-07-07 MED ORDER — HEPARIN SODIUM (PORCINE) 1000 UNIT/ML IJ SOLN
INTRAMUSCULAR | Status: DC | PRN
  Administered 2014-07-07: 1000 [IU] via INTRAVENOUS

## 2014-07-07 MED ORDER — NIMODIPINE 30 MG PO CAPS
60.0000 mg | ORAL_CAPSULE | ORAL | Status: AC
  Administered 2014-07-07: 60 mg via ORAL
  Filled 2014-07-07: qty 2

## 2014-07-07 MED ORDER — MIDAZOLAM HCL 5 MG/5ML IJ SOLN
INTRAMUSCULAR | Status: DC | PRN
  Administered 2014-07-07: 1 mg via INTRAVENOUS

## 2014-07-07 MED ORDER — ASPIRIN EC 325 MG PO TBEC
325.0000 mg | DELAYED_RELEASE_TABLET | Freq: Once | ORAL | Status: DC
  Filled 2014-07-07: qty 1

## 2014-07-07 MED ORDER — IOHEXOL 300 MG/ML  SOLN
150.0000 mL | Freq: Once | INTRAMUSCULAR | Status: AC | PRN
  Administered 2014-07-07: 80 mL via INTRA_ARTERIAL

## 2014-07-07 MED ORDER — LIDOCAINE HCL 1 % IJ SOLN
INTRAMUSCULAR | Status: AC
  Filled 2014-07-07: qty 20

## 2014-07-07 MED ORDER — CEFAZOLIN SODIUM-DEXTROSE 2-3 GM-% IV SOLR
2.0000 g | Freq: Once | INTRAVENOUS | Status: AC
  Administered 2014-07-07: 2 g via INTRAVENOUS
  Filled 2014-07-07: qty 50

## 2014-07-07 MED ORDER — FENTANYL CITRATE 0.05 MG/ML IJ SOLN
INTRAMUSCULAR | Status: DC | PRN
  Administered 2014-07-07 (×2): 25 ug via INTRAVENOUS

## 2014-07-07 MED ORDER — SODIUM CHLORIDE 0.9 % IV SOLN
Freq: Once | INTRAVENOUS | Status: AC
  Administered 2014-07-07: 07:00:00 via INTRAVENOUS

## 2014-07-07 MED ORDER — SODIUM CHLORIDE 0.9 % IV SOLN: INTRAVENOUS | Status: AC

## 2014-07-07 NOTE — Transfer of Care (Signed)
Immediate Anesthesia Transfer of Care Note  Patient: Julian Fowler  Procedure(s) Performed: Procedure(s): EMBOLIZATION (N/A)  Patient Location: Short Stay  Anesthesia Type:MAC  Level of Consciousness: awake and alert   Airway & Oxygen Therapy: Patient Spontanous Breathing and Patient connected to nasal cannula oxygen  Post-op Assessment: Report given to PACU RN, Post -op Vital signs reviewed and stable and Patient moving all extremities  Post vital signs: Reviewed and stable  Complications: No apparent anesthesia complications

## 2014-07-07 NOTE — Discharge Instructions (Signed)

## 2014-07-07 NOTE — Anesthesia Preprocedure Evaluation (Addendum)
Anesthesia Evaluation  Patient identified by MRN, date of birth, ID band Patient awake and Patient confused    Reviewed: Allergy & Precautions, H&P , NPO status , Patient's Chart, lab work & pertinent test results  History of Anesthesia Complications Negative for: history of anesthetic complications  Airway Mallampati: II TM Distance: >3 FB Neck ROM: Full    Dental  (+) Missing, Partial Upper, Partial Lower, Dental Advisory Given   Pulmonary sleep apnea and Continuous Positive Airway Pressure Ventilation , former smoker,  .  Hx of Hx of Asbestoses.  No SOB, able to swim and hold breath the length of a swimming pool.  Hx. Of OSA without ned for CPAP.  Quit smoking 1988 breath sounds clear to auscultation  Pulmonary exam normal + decreased breath sounds      Cardiovascular hypertension, Pt. on medications Rhythm:Regular  EF 55%  2013 ECHO   Neuro/Psych    GI/Hepatic negative GI ROS, Neg liver ROS, GERD-  Medicated,  Endo/Other  negative endocrine ROS  Renal/GU negative Renal ROS     Musculoskeletal   Abdominal Normal abdominal exam  (+) + obese,  Abdomen: soft.    Peds  Hematology Patient says he bleeds easily   Anesthesia Other Findings   Reproductive/Obstetrics                         Anesthesia Physical Anesthesia Plan  ASA: III  Anesthesia Plan: General and MAC   Post-op Pain Management:    Induction: Intravenous  Airway Management Planned: Oral ETT and Mask  Additional Equipment: Arterial line  Intra-op Plan:   Post-operative Plan: Extubation in OR  Informed Consent: I have reviewed the patients History and Physical, chart, labs and discussed the procedure including the risks, benefits and alternatives for the proposed anesthesia with the patient or authorized representative who has indicated his/her understanding and acceptance.   Dental advisory given  Plan Discussed with:  CRNA, Surgeon and Anesthesiologist  Anesthesia Plan Comments:        Anesthesia Quick Evaluation

## 2014-07-07 NOTE — H&P (Signed)
Chief Complaint: Roaring and pulsatile tinnitus  Referring Physician(s): Dr. Fredirick Maudlin  History of Present Illness: Julian Fowler is a 79 y.o. male with history of left superior cerebellar dural AV fistula previously treated with embolization and radiosurgery as well as prior resection of left vestibular schwannoma. He presents now with worsening symptoms of generalized roaring noise (" like a helicopter") in his head with new high frequency pulsatility, persistent balance difficulties and occ perioral numbness/heaviness. Recent MRI/MRA head revealed small residual enhancing vessels  along surface of the left cerebellum and a moderate to severe right PCA P2 segment stenosis with preserved distal flow. He is scheduled today for follow up cerebral arteriogram to obtain a more accurate assessment of the angioarchitecture with potential to treat endovascularly any abnormal communications with the dural AVF.   Past Medical History  Diagnosis Date  . GERD (gastroesophageal reflux disease)   . Hyperlipidemia     under control  . Hypertension December 29, 2009    renal artery Doppler , normal  12/2009  . Abdominal aorta injury     Normal size, ultrasound, March 17,2011  . Asbestos exposure Jan 2001    Hx of asbestos related pleural plaques  . Overactive bladder   . ED (erectile dysfunction)   . Arthritis   . Hearing loss in left ear   . Allergic rhinitis   . Ejection fraction     60%, echo, 2008, mild RV dysfunction  . Easy bruisability   . BPH (benign prostatic hyperplasia)   . History of shingles     "mild"  . H/O hiatal hernia   . Tinnitus   . Balance problem   . Complication of anesthesia     "trouble waking up"  . Sleep apnea     was dx 10 years ago reports lost a lot of weight and no longer machine    Past Surgical History  Procedure Laterality Date  . Craniectomy for excision of acoustic neuroma  3/95  . Trigger finger release  2003    (thumb) middle finger (2006)  .  Cerebral embolization  12/2011    "radiation therapy-did not work"  . Brain surgery    . Total knee arthroplasty Left 07/06/2013    Procedure: LEFT TOTAL KNEE ARTHROPLASTY;  Surgeon: Gearlean Alf, MD;  Location: [facility];  Service: Orthopedics;  Laterality: Left;  . Cholecystectomy  6/98  . Back surgery  2014    herniated L1, L2   Dr Carloyn Manner  . Total knee arthroplasty Right 01/18/2014    Procedure: RIGHT TOTAL KNEE ARTHROPLASTY;  Surgeon: Gearlean Alf, MD;  Location: [facility];  Service: Orthopedics;  Laterality: Right;    Allergies: Codeine and Tape  Medications: Prior to Admission medications   Medication Sig Start Date End Date Taking? Authorizing Provider  amLODipine (NORVASC) 2.5 MG tablet Take 1 tablet (2.5 mg total) by mouth every morning. 03/31/14  Yes Carlena Bjornstad, MD  aspirin 325 MG tablet Take 325 mg by mouth daily.   Yes Historical Provider, MD  diltiazem (CARDIZEM CD) 120 MG 24 hr capsule Take 1 capsule (120 mg total) by mouth 2 (two) times daily. 01/01/14  Yes Carlena Bjornstad, MD  docusate sodium 100 MG CAPS Take 100 mg by mouth 2 (two) times daily. 01/19/14  Yes Amber Renelda Loma, PA-C  furosemide (LASIX) 40 MG tablet Take 1 tablet by mouth daily. 09/17/13  Yes Historical Provider, MD  latanoprost (XALATAN) 0.005 % ophthalmic solution Place 1  drop into both eyes at bedtime.  09/01/12  Yes Historical Provider, MD  LORazepam (ATIVAN) 1 MG tablet Take 1-2 mg by mouth at bedtime as needed for anxiety.   Yes Historical Provider, MD  losartan (COZAAR) 100 MG tablet Take 100 mg by mouth every morning.    Yes Historical Provider, MD  Multiple Vitamins-Minerals (CENTRUM SILVER PO) Take 1 tablet by mouth.   Yes Historical Provider, MD  omeprazole (PRILOSEC) 20 MG capsule Take 20 mg by mouth daily. Take 1 tablet once daily thirty minutes before a meal   Yes Historical Provider, MD  potassium chloride SA (K-DUR,KLOR-CON) 20 MEQ tablet Take 1 tablet (20 mEq total) by mouth daily. 09/14/13  Yes  Carlena Bjornstad, MD  pravastatin (PRAVACHOL) 20 MG tablet Take 20 mg by mouth at bedtime.     Yes Historical Provider, MD  vitamin A 8000 UNIT capsule Take 8,000 Units by mouth daily.   Yes Historical Provider, MD  zolpidem (AMBIEN) 10 MG tablet Take 10 mg by mouth at bedtime.  09/17/13  Yes Historical Provider, MD  acetaminophen (TYLENOL) 325 MG tablet Take 325 mg by mouth every 6 (six) hours as needed for pain.     Historical Provider, MD  Calcium Carb-Vit D-C-E-Mineral (OS-CAL ULTRA) 600 MG TABS Take 1 tablet by mouth.    Historical Provider, MD  fexofenadine (ALLEGRA) 180 MG tablet Take 180 mg by mouth daily as needed. For allergies    Historical Provider, MD  fluticasone (FLONASE) 50 MCG/ACT nasal spray Place 2 sprays into both nostrils daily.    Historical Provider, MD    Family History  Problem Relation Age of Onset  . Cancer Father     oral cancer  . Breast cancer Mother   . Heart attack Mother   . Hypertension Sister     Bypass x4  . Alcohol abuse Sister   . Alzheimer's disease Sister   . Kidney disease Sister     History   Social History  . Marital Status: Married    Spouse Name: N/A    Number of Children: N/A  . Years of Education: N/A   Social History Main Topics  . Smoking status: Former Smoker -- 1.50 packs/day for 30 years    Types: Cigarettes    Quit date: 10/15/1986  . Smokeless tobacco: Never Used  . Alcohol Use: No     Comment: Used to drink heavily at times  . Drug Use: No  . Sexual Activity: None   Other Topics Concern  . None   Social History Narrative   Co-dependent relationship with his 73 year old son who has drug and financial problems         Review of Systems    Constitutional: Negative for fever and chills.  HENT: Positive for hearing loss and tinnitus.   Eyes: Negative for visual disturbance.  Respiratory: Negative for cough and shortness of breath.   Cardiovascular: Negative for chest pain.  Gastrointestinal: Negative for nausea,  vomiting, abdominal pain and blood in stool.  Genitourinary: Negative for dysuria and hematuria.  Musculoskeletal: Negative for back pain.       Balance difficulties  Neurological: Negative for tremors, seizures, syncope, speech difficulty and headaches.  Hematological: Bruises/bleeds easily.    Vital Signs: BP 158/64  Pulse 57  Temp(Src) 98.7 F (37.1 C) (Oral)  Resp 20  Wt 220 lb (99.791 kg)  SpO2 97%  Physical Exam  Constitutional: He is oriented to person, place, and time. He appears  well-developed and well-nourished.  HENT:  Wears hearing aid  Eyes: EOM are normal. Pupils are equal, round, and reactive to light.  Cardiovascular: Normal rate and regular rhythm.   Pulmonary/Chest: Effort normal and breath sounds normal.  Abdominal: Soft. Bowel sounds are normal. There is no tenderness.  Musculoskeletal: Normal range of motion. He exhibits no edema.  Neurological: He is alert and oriented to person, place, and time. No cranial nerve deficit. Coordination normal.  Skin: Skin is warm and dry.    Imaging: Mr Virgel Paling WJ Contrast  06/10/2014   CLINICAL DATA:  79 year old male with history of left superior cerebellar dural AV fistula treated with embolization and radiosurgery, and prior to that the patient had surgical resection of left vestibular schwannoma. Presents today with new visual changes and gait abnormality. Recent worsening tinnitus. Initial encounter.  EXAM: MRI HEAD WITHOUT AND WITH CONTRAST  MRA HEAD WITHOUT CONTRAST  MRV HEAD WITHOUT CONTRAST  TECHNIQUE: Multiplanar, multiecho pulse sequences of the brain and surrounding structures were obtained without and with intravenous contrast. Angiographic images of the Circle of Willis were obtained using MRA technique without intravenous contrast. Angiographic images of the neck Head were obtained using MRV technique without intra venous contrast.  CONTRAST:  79mL MULTIHANCE GADOBENATE DIMEGLUMINE 529 MG/ML IV SOLN  COMPARISON:   Post embolization head CT 06/23/2012. Pre embolization brain MRI 12/11/2011  FINDINGS: MRI HEAD FINDINGS  Overall cerebral volume is stable. Major intracranial vascular flow voids are stable, except that multiple abnormal flow voids along the surface of the left cerebellum demonstrated in 2013 are decreased in conspicuity/resolved.  No restricted diffusion to suggest acute infarction. No midline shift, mass effect, evidence of mass lesion, ventriculomegaly, extra-axial collection or acute intracranial hemorrhage. Cervicomedullary junction and pituitary are within normal limits. Negative visualized cervical spine. Normal bone marrow signal.  Chronic postoperative changes at the left mastoid. Stable brainstem and cerebellar signal, with no precontrast signal abnormality. Stable visible bilateral internal auditory structures, those on the right appearing unremarkable. Supratentorial mild for age nonspecific white matter T2 and FLAIR hyperintensity has not progressed.  Following contrast, smooth Mild dural enhancement along the posterior surface of the brain is new (series 14, image 27 today versus series 10, image 38 previously). Abnormal small enhancing vessels along the surface of the left cerebellum have decreased, but not completely resolved (series 14, image 12 today versus series 10, image 17 previously). Left internal auditory enhancement was better evaluated with thin slice technique in 1914. No other abnormal enhancement identified.  Visualized orbit soft tissues are within normal limits. Right mastoids and paranasal sinuses are stable and clear. No acute scalp soft tissue findings.  MRA HEAD FINDINGS  Antegrade flow in the posterior circulation with dominant distal left vertebral artery. Normal PICA origins. Patent vertebrobasilar junction. Prominent left AICA (series 3, image 62) tracking towards the left mastoid region, left lateral cerebellum. Normal right AICA. No basilar artery stenosis. SCA and PCA  origins are normal. Posterior communicating arteries are diminutive or absent. Left PCA branches are within normal limits.  No abnormal MRA flow signal about the left transverse sinus, sigmoid sinus, or along the surface of the left cerebellum.  Negative right PCA except for moderate to severe focal stenosis in the P2 segment (series 305, image 14). There is preserved distal flow.  Antegrade flow in both ICA siphons. No siphon stenosis. Irregularity compatible with ICA atherosclerosis. Ophthalmic artery origins are normal. Patent carotid termini. MCA and ACA origins are within normal limits. Normal anterior communicating  artery. Non dominant right A1 segment. Visualized bilateral ACA branches are within normal limits. Visualized bilateral MCA branches are within normal limits.  MRV HEAD FINDINGS  Normal flow signal in the superior sagittal sinus, torcula (allowing for some susceptibility artifact), transverse sinuses, straight sinus, vein of Galen, internal cerebral veins, and basal veins of Rosenthal. Bilateral sigmoid sinus flow signal appears symmetric and within normal limits. No abnormal signal identified about the left cerebellum.  IMPRESSION: 1. No adverse features identified status post treatment of left superior cerebellar dural AV fistula: - Mild interval posterior fossa smooth dural thickening and enhancement may be sequelae of radiotherapy, - Several small residual enhancing vessels along the surface of the left cerebellum, but no corresponding abnormal MRA or MRV signal. 2. Stable postoperative appearance of the left temporal bone. 3. No new intracranial abnormality identified. 4. Moderate to severe right PCA P2 segment stenosis with preserved distal flow.   Electronically Signed   By: Lars Pinks M.D.   On: 06/10/2014 16:21   Mr Jeri Cos HK Contrast  06/10/2014   CLINICAL DATA:  79 year old male with history of left superior cerebellar dural AV fistula treated with embolization and radiosurgery, and  prior to that the patient had surgical resection of left vestibular schwannoma. Presents today with new visual changes and gait abnormality. Recent worsening tinnitus. Initial encounter.  EXAM: MRI HEAD WITHOUT AND WITH CONTRAST  MRA HEAD WITHOUT CONTRAST  MRV HEAD WITHOUT CONTRAST  TECHNIQUE: Multiplanar, multiecho pulse sequences of the brain and surrounding structures were obtained without and with intravenous contrast. Angiographic images of the Circle of Willis were obtained using MRA technique without intravenous contrast. Angiographic images of the neck Head were obtained using MRV technique without intra venous contrast.  CONTRAST:  75mL MULTIHANCE GADOBENATE DIMEGLUMINE 529 MG/ML IV SOLN  COMPARISON:  Post embolization head CT 06/23/2012. Pre embolization brain MRI 12/11/2011  FINDINGS: MRI HEAD FINDINGS  Overall cerebral volume is stable. Major intracranial vascular flow voids are stable, except that multiple abnormal flow voids along the surface of the left cerebellum demonstrated in 2013 are decreased in conspicuity/resolved.  No restricted diffusion to suggest acute infarction. No midline shift, mass effect, evidence of mass lesion, ventriculomegaly, extra-axial collection or acute intracranial hemorrhage. Cervicomedullary junction and pituitary are within normal limits. Negative visualized cervical spine. Normal bone marrow signal.  Chronic postoperative changes at the left mastoid. Stable brainstem and cerebellar signal, with no precontrast signal abnormality. Stable visible bilateral internal auditory structures, those on the right appearing unremarkable. Supratentorial mild for age nonspecific white matter T2 and FLAIR hyperintensity has not progressed.  Following contrast, smooth Mild dural enhancement along the posterior surface of the brain is new (series 14, image 27 today versus series 10, image 38 previously). Abnormal small enhancing vessels along the surface of the left cerebellum have  decreased, but not completely resolved (series 14, image 12 today versus series 10, image 17 previously). Left internal auditory enhancement was better evaluated with thin slice technique in 7425. No other abnormal enhancement identified.  Visualized orbit soft tissues are within normal limits. Right mastoids and paranasal sinuses are stable and clear. No acute scalp soft tissue findings.  MRA HEAD FINDINGS  Antegrade flow in the posterior circulation with dominant distal left vertebral artery. Normal PICA origins. Patent vertebrobasilar junction. Prominent left AICA (series 3, image 62) tracking towards the left mastoid region, left lateral cerebellum. Normal right AICA. No basilar artery stenosis. SCA and PCA origins are normal. Posterior communicating arteries are diminutive or absent.  Left PCA branches are within normal limits.  No abnormal MRA flow signal about the left transverse sinus, sigmoid sinus, or along the surface of the left cerebellum.  Negative right PCA except for moderate to severe focal stenosis in the P2 segment (series 305, image 14). There is preserved distal flow.  Antegrade flow in both ICA siphons. No siphon stenosis. Irregularity compatible with ICA atherosclerosis. Ophthalmic artery origins are normal. Patent carotid termini. MCA and ACA origins are within normal limits. Normal anterior communicating artery. Non dominant right A1 segment. Visualized bilateral ACA branches are within normal limits. Visualized bilateral MCA branches are within normal limits.  MRV HEAD FINDINGS  Normal flow signal in the superior sagittal sinus, torcula (allowing for some susceptibility artifact), transverse sinuses, straight sinus, vein of Galen, internal cerebral veins, and basal veins of Rosenthal. Bilateral sigmoid sinus flow signal appears symmetric and within normal limits. No abnormal signal identified about the left cerebellum.  IMPRESSION: 1. No adverse features identified status post treatment of  left superior cerebellar dural AV fistula: - Mild interval posterior fossa smooth dural thickening and enhancement may be sequelae of radiotherapy, - Several small residual enhancing vessels along the surface of the left cerebellum, but no corresponding abnormal MRA or MRV signal. 2. Stable postoperative appearance of the left temporal bone. 3. No new intracranial abnormality identified. 4. Moderate to severe right PCA P2 segment stenosis with preserved distal flow.   Electronically Signed   By: Lars Pinks M.D.   On: 06/10/2014 16:21   Mr Mrv Head Wo Cm  06/10/2014   CLINICAL DATA:  79 year old male with history of left superior cerebellar dural AV fistula treated with embolization and radiosurgery, and prior to that the patient had surgical resection of left vestibular schwannoma. Presents today with new visual changes and gait abnormality. Recent worsening tinnitus. Initial encounter.  EXAM: MRI HEAD WITHOUT AND WITH CONTRAST  MRA HEAD WITHOUT CONTRAST  MRV HEAD WITHOUT CONTRAST  TECHNIQUE: Multiplanar, multiecho pulse sequences of the brain and surrounding structures were obtained without and with intravenous contrast. Angiographic images of the Circle of Willis were obtained using MRA technique without intravenous contrast. Angiographic images of the neck Head were obtained using MRV technique without intra venous contrast.  CONTRAST:  24mL MULTIHANCE GADOBENATE DIMEGLUMINE 529 MG/ML IV SOLN  COMPARISON:  Post embolization head CT 06/23/2012. Pre embolization brain MRI 12/11/2011  FINDINGS: MRI HEAD FINDINGS  Overall cerebral volume is stable. Major intracranial vascular flow voids are stable, except that multiple abnormal flow voids along the surface of the left cerebellum demonstrated in 2013 are decreased in conspicuity/resolved.  No restricted diffusion to suggest acute infarction. No midline shift, mass effect, evidence of mass lesion, ventriculomegaly, extra-axial collection or acute intracranial  hemorrhage. Cervicomedullary junction and pituitary are within normal limits. Negative visualized cervical spine. Normal bone marrow signal.  Chronic postoperative changes at the left mastoid. Stable brainstem and cerebellar signal, with no precontrast signal abnormality. Stable visible bilateral internal auditory structures, those on the right appearing unremarkable. Supratentorial mild for age nonspecific white matter T2 and FLAIR hyperintensity has not progressed.  Following contrast, smooth Mild dural enhancement along the posterior surface of the brain is new (series 14, image 27 today versus series 10, image 38 previously). Abnormal small enhancing vessels along the surface of the left cerebellum have decreased, but not completely resolved (series 14, image 12 today versus series 10, image 17 previously). Left internal auditory enhancement was better evaluated with thin slice technique in 5176. No other  abnormal enhancement identified.  Visualized orbit soft tissues are within normal limits. Right mastoids and paranasal sinuses are stable and clear. No acute scalp soft tissue findings.  MRA HEAD FINDINGS  Antegrade flow in the posterior circulation with dominant distal left vertebral artery. Normal PICA origins. Patent vertebrobasilar junction. Prominent left AICA (series 3, image 62) tracking towards the left mastoid region, left lateral cerebellum. Normal right AICA. No basilar artery stenosis. SCA and PCA origins are normal. Posterior communicating arteries are diminutive or absent. Left PCA branches are within normal limits.  No abnormal MRA flow signal about the left transverse sinus, sigmoid sinus, or along the surface of the left cerebellum.  Negative right PCA except for moderate to severe focal stenosis in the P2 segment (series 305, image 14). There is preserved distal flow.  Antegrade flow in both ICA siphons. No siphon stenosis. Irregularity compatible with ICA atherosclerosis. Ophthalmic artery  origins are normal. Patent carotid termini. MCA and ACA origins are within normal limits. Normal anterior communicating artery. Non dominant right A1 segment. Visualized bilateral ACA branches are within normal limits. Visualized bilateral MCA branches are within normal limits.  MRV HEAD FINDINGS  Normal flow signal in the superior sagittal sinus, torcula (allowing for some susceptibility artifact), transverse sinuses, straight sinus, vein of Galen, internal cerebral veins, and basal veins of Rosenthal. Bilateral sigmoid sinus flow signal appears symmetric and within normal limits. No abnormal signal identified about the left cerebellum.  IMPRESSION: 1. No adverse features identified status post treatment of left superior cerebellar dural AV fistula: - Mild interval posterior fossa smooth dural thickening and enhancement may be sequelae of radiotherapy, - Several small residual enhancing vessels along the surface of the left cerebellum, but no corresponding abnormal MRA or MRV signal. 2. Stable postoperative appearance of the left temporal bone. 3. No new intracranial abnormality identified. 4. Moderate to severe right PCA P2 segment stenosis with preserved distal flow.   Electronically Signed   By: Lars Pinks M.D.   On: 06/10/2014 16:21    Labs: Lab Results  Component Value Date   WBC 6.7 07/01/2014   HCT 41.2 07/01/2014   MCV 85.1 07/01/2014   PLT 315 07/01/2014   NA 137 07/01/2014   K 4.3 07/01/2014   CL 98 07/01/2014   CO2 26 07/01/2014   GLUCOSE 85 07/01/2014   BUN 13 07/01/2014   CREATININE 0.90 07/01/2014   CALCIUM 9.5 07/01/2014   PROT 7.7 07/01/2014   ALBUMIN 3.7 07/01/2014   AST 25 07/01/2014   ALT 16 07/01/2014   ALKPHOS 100 07/01/2014   BILITOT 0.4 07/01/2014   GFRNONAA 80* 07/01/2014   GFRAA >90 07/01/2014   INR 1.00 07/01/2014    Assessment and Plan: Julian Fowler is a 79 y.o. male with history of left superior cerebellar dural AV fistula previously treated with embolization and radiosurgery  as well as prior resection of left vestibular schwannoma. He presents now with worsening symptoms of generalized roaring noise (" like a helicopter") in his head with new high frequency pulsatility, persistent balance difficulties and occ perioral numbness/heaviness. Recent MRI/MRA head revealed small residual enhancing vessels  along surface of the left cerebellum and a moderate to severe right PCA P2 segment stenosis with preserved distal flow. He is scheduled today for follow up cerebral arteriogram to obtain a more accurate assessment of the angioarchitecture with potential to treat endovascularly any abnormal communications with the dural AVF. Details/risks of procedure d/w pt/family with their understanding and consent. If intervention is  performed pt will be admitted to neuro ICU for overnight observation.            Signed: Autumn Messing 07/07/2014, 8:04 AM

## 2014-07-07 NOTE — Sedation Documentation (Signed)
Bilateral pedal and post-tib pulses +2 palpated.

## 2014-07-07 NOTE — Anesthesia Postprocedure Evaluation (Signed)
  Anesthesia Post-op Note  Patient: Julian Fowler  Procedure(s) Performed: Procedure(s): EMBOLIZATION (N/A)  Patient Location: Short Stay  Anesthesia Type:MAC  Level of Consciousness: awake, alert  and oriented  Airway and Oxygen Therapy: Patient Spontanous Breathing and Patient connected to nasal cannula oxygen  Post-op Pain: none  Post-op Assessment: Post-op Vital signs reviewed, Patient's Cardiovascular Status Stable, Respiratory Function Stable and Patent Airway  Post-op Vital Signs: Reviewed and stable  Last Vitals:  Filed Vitals:   07/07/14 0641  BP: 158/64  Pulse: 57  Temp: 37.1 C  Resp: 20    Complications: No apparent anesthesia complications

## 2014-07-07 NOTE — Sedation Documentation (Signed)
+  2 palpable pulses, post-tib and pedal.

## 2014-07-07 NOTE — Sedation Documentation (Signed)
Pt transported to P3, VSS, right groin without hematoma or bleeding at this time.  Pulses palpable.

## 2014-07-07 NOTE — Procedures (Signed)
S/P 4 vessel cerebral arteriogram. RT% CFA approach. Findings. 1.No residual DAVF  Noted. Venous drainage  Within normal limits.

## 2014-07-08 ENCOUNTER — Other Ambulatory Visit (HOSPITAL_COMMUNITY): Payer: Self-pay | Admitting: Interventional Radiology

## 2014-07-08 DIAGNOSIS — I770 Arteriovenous fistula, acquired: Secondary | ICD-10-CM

## 2014-07-09 ENCOUNTER — Encounter (HOSPITAL_COMMUNITY): Payer: Self-pay | Admitting: Interventional Radiology

## 2014-07-12 ENCOUNTER — Ambulatory Visit (HOSPITAL_COMMUNITY)
Admission: RE | Admit: 2014-07-12 | Discharge: 2014-07-12 | Disposition: A | Discharge status: Home / Self Care | Payer: Medicare Other | Source: Ambulatory Visit | Attending: Interventional Radiology | Admitting: Interventional Radiology

## 2014-07-12 DIAGNOSIS — I770 Arteriovenous fistula, acquired: Secondary | ICD-10-CM | POA: Diagnosis not present

## 2014-07-12 DIAGNOSIS — H9319 Tinnitus, unspecified ear: Secondary | ICD-10-CM | POA: Diagnosis not present

## 2014-08-12 DIAGNOSIS — Z471 Aftercare following joint replacement surgery: Secondary | ICD-10-CM | POA: Diagnosis not present

## 2014-08-12 DIAGNOSIS — Z96651 Presence of right artificial knee joint: Secondary | ICD-10-CM | POA: Diagnosis not present

## 2014-08-12 DIAGNOSIS — S82041D Displaced comminuted fracture of right patella, subsequent encounter for closed fracture with routine healing: Secondary | ICD-10-CM | POA: Diagnosis not present

## 2014-08-14 ENCOUNTER — Other Ambulatory Visit: Payer: Self-pay | Admitting: Internal Medicine

## 2014-08-20 DIAGNOSIS — J302 Other seasonal allergic rhinitis: Secondary | ICD-10-CM | POA: Diagnosis not present

## 2014-08-20 DIAGNOSIS — J019 Acute sinusitis, unspecified: Secondary | ICD-10-CM | POA: Diagnosis not present

## 2014-08-20 NOTE — Telephone Encounter (Signed)
CY Please advise on refill. Thanks.  

## 2014-08-21 NOTE — Telephone Encounter (Signed)
Ok to refill 

## 2014-08-23 DIAGNOSIS — H4011X2 Primary open-angle glaucoma, moderate stage: Secondary | ICD-10-CM | POA: Diagnosis not present

## 2014-08-24 ENCOUNTER — Telehealth: Payer: Self-pay | Admitting: Internal Medicine

## 2014-08-24 MED ORDER — LORAZEPAM 1 MG PO TABS: 1.0000 mg | ORAL_TABLET | Freq: Every evening | ORAL | Status: DC | PRN

## 2014-08-24 NOTE — Telephone Encounter (Signed)
[phone] calling back

## 2014-08-24 NOTE — Telephone Encounter (Signed)
Ok to refill as before 

## 2014-08-24 NOTE — Telephone Encounter (Signed)
[email or web address] to speak with pt before this medication is sent to the pharmacy

## 2014-08-24 NOTE — Telephone Encounter (Signed)
Pt is requesting a refill of the lorazepam.  Please advise if ok to send in a refill for this pt.   Last ov--12/11/214 Next ov--10/21/2014  Allergies  Allergen Reactions  . Codeine Other (See Comments)    REACTION: groggy  . Tape Other (See Comments)    Skin tears, use paper tape    Current Outpatient Prescriptions on File Prior to Visit  Medication Sig Dispense Refill  . acetaminophen (TYLENOL) 325 MG tablet Take 325 mg by mouth every 6 (six) hours as needed for pain.     Marland Kitchen amLODipine (NORVASC) 2.5 MG tablet Take 1 tablet (2.5 mg total) by mouth every morning. 90 tablet 3  . aspirin 325 MG tablet Take 325 mg by mouth daily.    . Calcium Carb-Vit D-C-E-Mineral (OS-CAL ULTRA) 600 MG TABS Take 1 tablet by mouth.    . diltiazem (CARDIZEM CD) 120 MG 24 hr capsule Take 1 capsule (120 mg total) by mouth 2 (two) times daily. 180 capsule 3  . docusate sodium 100 MG CAPS Take 100 mg by mouth 2 (two) times daily. 10 capsule 0  . fexofenadine (ALLEGRA) 180 MG tablet Take 180 mg by mouth daily as needed. For allergies    . fluticasone (FLONASE) 50 MCG/ACT nasal spray Place 2 sprays into both nostrils daily.    . furosemide (LASIX) 40 MG tablet Take 1 tablet by mouth daily.    Marland Kitchen latanoprost (XALATAN) 0.005 % ophthalmic solution Place 1 drop into both eyes at bedtime.     Marland Kitchen LORazepam (ATIVAN) 1 MG tablet Take 1-2 mg by mouth at bedtime as needed for anxiety.    Marland Kitchen losartan (COZAAR) 100 MG tablet Take 100 mg by mouth every morning.     . Multiple Vitamins-Minerals (CENTRUM SILVER PO) Take 1 tablet by mouth.    Marland Kitchen omeprazole (PRILOSEC) 20 MG capsule Take 20 mg by mouth daily. Take 1 tablet once daily thirty minutes before a meal    . potassium chloride SA (K-DUR,KLOR-CON) 20 MEQ tablet Take 1 tablet (20 mEq total) by mouth daily. 90 tablet 3  . pravastatin (PRAVACHOL) 20 MG tablet Take 20 mg by mouth at bedtime.      . vitamin A 8000 UNIT capsule Take 8,000 Units by mouth daily.    Marland Kitchen zolpidem (AMBIEN) 10  MG tablet Take 10 mg by mouth at bedtime.      No current facility-administered medications on file prior to visit.

## 2014-08-24 NOTE — Telephone Encounter (Signed)
Pt returning call.Julian Fowler ° °

## 2014-08-24 NOTE — Telephone Encounter (Signed)
Spoke with pt and advised that rx was called to pharmacy for Lorazepam.

## 2014-08-26 ENCOUNTER — Other Ambulatory Visit: Payer: Self-pay | Admitting: *Deleted

## 2014-08-26 MED ORDER — LORAZEPAM 1 MG PO TABS: 1.0000 mg | ORAL_TABLET | Freq: Every evening | ORAL | Status: DC | PRN

## 2014-09-02 DIAGNOSIS — H903 Sensorineural hearing loss, bilateral: Secondary | ICD-10-CM | POA: Diagnosis not present

## 2014-09-02 DIAGNOSIS — T81719A Complication of unspecified artery following a procedure, not elsewhere classified, initial encounter: Secondary | ICD-10-CM | POA: Diagnosis not present

## 2014-09-02 DIAGNOSIS — H9313 Tinnitus, bilateral: Secondary | ICD-10-CM | POA: Diagnosis not present

## 2014-09-22 ENCOUNTER — Telehealth: Payer: Self-pay | Admitting: Cardiology

## 2014-09-22 NOTE — Telephone Encounter (Signed)
potassium chloride SA (K-DUR,KLOR-CON) 20 MEQ tablet  [facility] telling him that they have not received the request

## 2014-09-23 MED ORDER — POTASSIUM CHLORIDE CRYS ER 20 MEQ PO TBCR: 20.0000 meq | EXTENDED_RELEASE_TABLET | Freq: Every day | ORAL | Status: DC

## 2014-09-24 ENCOUNTER — Ambulatory Visit: Payer: Medicare Other | Admitting: Internal Medicine

## 2014-09-28 DIAGNOSIS — N400 Enlarged prostate without lower urinary tract symptoms: Secondary | ICD-10-CM | POA: Diagnosis not present

## 2014-09-28 DIAGNOSIS — E871 Hypo-osmolality and hyponatremia: Secondary | ICD-10-CM | POA: Diagnosis not present

## 2014-09-28 DIAGNOSIS — E782 Mixed hyperlipidemia: Secondary | ICD-10-CM | POA: Diagnosis not present

## 2014-09-28 DIAGNOSIS — R7301 Impaired fasting glucose: Secondary | ICD-10-CM | POA: Diagnosis not present

## 2014-09-28 DIAGNOSIS — I10 Essential (primary) hypertension: Secondary | ICD-10-CM | POA: Diagnosis not present

## 2014-10-05 DIAGNOSIS — E871 Hypo-osmolality and hyponatremia: Secondary | ICD-10-CM | POA: Diagnosis not present

## 2014-10-05 DIAGNOSIS — J302 Other seasonal allergic rhinitis: Secondary | ICD-10-CM | POA: Diagnosis not present

## 2014-10-05 DIAGNOSIS — M1991 Primary osteoarthritis, unspecified site: Secondary | ICD-10-CM | POA: Diagnosis not present

## 2014-10-05 DIAGNOSIS — E782 Mixed hyperlipidemia: Secondary | ICD-10-CM | POA: Diagnosis not present

## 2014-10-05 DIAGNOSIS — I10 Essential (primary) hypertension: Secondary | ICD-10-CM | POA: Diagnosis not present

## 2014-10-05 DIAGNOSIS — R7301 Impaired fasting glucose: Secondary | ICD-10-CM | POA: Diagnosis not present

## 2014-10-05 DIAGNOSIS — N400 Enlarged prostate without lower urinary tract symptoms: Secondary | ICD-10-CM | POA: Diagnosis not present

## 2014-10-05 DIAGNOSIS — M7550 Bursitis of unspecified shoulder: Secondary | ICD-10-CM | POA: Diagnosis not present

## 2014-10-21 ENCOUNTER — Ambulatory Visit (INDEPENDENT_AMBULATORY_CARE_PROVIDER_SITE_OTHER)
Admission: RE | Admit: 2014-10-21 | Discharge: 2014-10-21 | Disposition: A | Discharge status: Home / Self Care | Payer: Medicare Other | Source: Ambulatory Visit | Attending: Internal Medicine | Admitting: Internal Medicine

## 2014-10-21 ENCOUNTER — Ambulatory Visit (INDEPENDENT_AMBULATORY_CARE_PROVIDER_SITE_OTHER): Payer: Medicare Other | Admitting: Internal Medicine

## 2014-10-21 ENCOUNTER — Encounter: Payer: Self-pay | Admitting: Internal Medicine

## 2014-10-21 VITALS — BP 142/78 | HR 60 | Wt 224.4 lb

## 2014-10-21 DIAGNOSIS — G4733 Obstructive sleep apnea (adult) (pediatric): Secondary | ICD-10-CM

## 2014-10-21 DIAGNOSIS — R091 Pleurisy: Secondary | ICD-10-CM | POA: Diagnosis not present

## 2014-10-21 DIAGNOSIS — Z77090 Contact with and (suspected) exposure to asbestos: Secondary | ICD-10-CM

## 2014-10-21 DIAGNOSIS — J4520 Mild intermittent asthma, uncomplicated: Secondary | ICD-10-CM | POA: Diagnosis not present

## 2014-10-21 DIAGNOSIS — Z87891 Personal history of nicotine dependence: Secondary | ICD-10-CM

## 2014-10-21 DIAGNOSIS — G4700 Insomnia, unspecified: Secondary | ICD-10-CM

## 2014-10-21 DIAGNOSIS — F5104 Psychophysiologic insomnia: Secondary | ICD-10-CM

## 2014-10-21 NOTE — Patient Instructions (Signed)
Order- CXR dx asbestos exposure

## 2014-10-21 NOTE — Progress Notes (Signed)
07/26/11- 80 year old male former smoker followed for allergic rhinitis, asthma, COPD, obstructive sleep apnea, history of asbestos exposure/plaques. Last here- 07/25/2010  Has had flu vaccine. He had been swimming regularly and had kept his weight down. Had surgery on his hand, that hurt his shoulder and has not been swimming is much. Said previously he could swim the length of the pool underwater. Needing lorazepam still to sleep, especially for sleep onset. Since he lost weight he has not needed CPAP. For the last week and a half has had pain across his upper mid back if he stands up straight. This is not affected by lying supine or by coughing.  09/23/12- 80 year old male former smoker followed for allergic rhinitis, asthma, COPD, obstructive sleep apnea, history of asbestos exposure/plaques. 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 Partially managed with interventional radiology placing coils. Gamma knife radiation therapy at [facility] did not help. Still swimming regularly. Denies cough, chest pain, shortness of breath. Insomnia has been more troublesome this year, partly because of stress from the above issues. Julian Fowler it just made him groggy. Combination of Ambien 5 mg plus lorazepam 1 mg helps fairly well. Still wakes during the night. We discussed potential that he could tolerate lorazepam 2 mg. He denies nighttime confusion, sleepwalking or morning hangover. He has not recognized sleep apnea as an issue since he lost weight years ago  09/24/13- 80 year old male former smoker followed for allergic rhinitis, asthma, COPD, hx obstructive sleep apnea, history of asbestos exposure/plaques. Chronic insomnia FOLLOWS FOR: denies any trouble with  breathing or sleep-continues to use sleeping meds. Has had back surgery and total knee replacement since last here. Left calf now swells-checked twice for DVT and maintained on diuretic. Continues antihistamines for rhinitis. Sleeping well with medication. CXR 09/23/12 IMPRESSION:  Stable bilateral pleural plaques consistent with a history of  previous asbestos exposure.  No acute cardiopulmonary disease. No change from prior study.  Original Report Authenticated By: Lajean Manes, M.D.  10/21/14- 80 year old male former smoker followed for allergic rhinitis, asthma, COPD, hx obstructive sleep apnea, history of asbestos exposure/plaques. Chronic insomnia Much "sinus" this fall blamed on damp weather. Doing much better now. Since last year had right total knee replacement uncomplicated. Still swimming 3 days per week. He stopped using CPAP years ago when he lost significant weight. CXR 09/24/13 IMPRESSION: Stable bilateral chronic changes consistent with patient's history of asbestos exposure. No acute abnormalities identified. Electronically Signed  By: Margaree Mackintosh M.D.  On: 09/24/2013 12:50  ROS See HPI Constitutional:   No-   weight loss, night sweats, fevers, chills, fatigue, lassitude. HEENT:   + headaches,  No -difficulty swallowing, tooth/dental problems, sore throat,       No-  sneezing, itching, ear ache, nasal congestion, post nasal drip,  CV:  No-   chest pain, orthopnea, PND, swelling in lower extremities, no-anasarca, dizziness, palpitations Resp: No-   shortness of breath with exertion or at rest.              No-   productive cough,  No non-productive cough,  No-  coughing up of blood.              No-   change in color of mucus.  No- wheezing.   Skin: No-  rash or lesions. GI:  No-   heartburn, indigestion, abdominal pain, nausea, vomiting,              GU:  MS:  + joint pain or swelling. .  + back pain. Neuro-  nothing unusual except per  HPI Psych:  No- change  in mood or affect. No depression or anxiety.  No memory loss.  OBJ General- Alert, Oriented, Affect-appropriate, Distress- none acute Skin- rash-none, lesions- none, excoriation- none,  Lymphadenopathy- none Head- atraumatic            Eyes- Gross vision intact, PERRLA, conjunctivae clear secretions            Ears- +hard of hearing            Nose- Clear, no-Septal dev, mucus, polyps, erosion, perforation             Throat- Mallampati II , mucosa clear , drainage- none, tonsils- atrophic. Dental repair. Neck- flexible , trachea midline, no stridor , thyroid nl, carotid no bruit Chest - symmetrical excursion , unlabored           Heart/CV- slow RRR , no murmur , no gallop  , no rub, nl s1 s2                           - JVD- none , edema- none, stasis changes- none, varices- none           Lung- clear to P&A, wheeze- none, cough- none , dullness-none, rub- none. I cannot hear fine crackles or rub.           Chest wall-  Abd-  Br/ Gen/ Rectal- Not done, not indicated Extrem- +edema and erythema left calf/ankle,  Neuro- grossly intact to observation. Speech is clear.

## 2014-10-26 ENCOUNTER — Telehealth: Payer: Self-pay | Admitting: Internal Medicine

## 2014-10-26 DIAGNOSIS — Z77090 Contact with and (suspected) exposure to asbestos: Secondary | ICD-10-CM

## 2014-10-26 NOTE — Telephone Encounter (Signed)
Result Notes     Notes Recorded by Glean Hess, CMA on 10/26/2014 at 2:08 PM lmtcb 10/26/14 ------  Notes Recorded by Deneise Lever, MD on 10/21/2014 at 5:00 PM CXR- looks stable,except that the pleural plaques look more prominent. I would like to schedule an outpatient CXR in 6 months to look again at these, rather than waiting a year.  Dx history of asbestos exposure   Called spoke with patient and discussed cxr results/recs as stated by CY above.  Pt voiced his understanding and denied any questions/concerns at this time.  Appt scheduled with CY on 7.12.16 @ 0915; pt will arrive approx 16mins early to have cxr prior to appt (order already placed STAT).  Pt is aware to call the office with any questions/concerns prior to appt.  Nothing further needed at this time; will sign off.

## 2014-10-27 ENCOUNTER — Telehealth: Payer: Self-pay | Admitting: Internal Medicine

## 2014-10-27 NOTE — Telephone Encounter (Signed)
Called spoke with patient and discussed CY's recommendations as stated below.  Pt voiced his understanding and does feel much better about the cxr report.    Nothing further needed at this time; will sign off.

## 2014-10-27 NOTE — Telephone Encounter (Signed)
I am not concerned about slight changes on the CXR, just being careful and didn't feel we should wait a whole year to follow up. No reason to change activity. He can let us know if he FEELS something significantly different.

## 2014-10-27 NOTE — Telephone Encounter (Signed)
Explained the results to the patient again as below, pt wanting to know if there is any reason to be concerned with the most recent change in his lungs?  When its stated that the pleural plaques look more prominent, how bad is this?  Will it worsen quickly over time? Is it something that will worsen lung function? Anything in his daily activities that needs to be limited?  Pt is a swimmer, any concern with swimming, any concern with "over working lungs"?  Anything the patient needs to be steer clear of such as mowing grass in spring time? Pt would like to discuss signs/symptoms to keep any eye out for of possible worsening in lung condition. Please advise Dr Annamaria Boots. Thanks.   Result Notes     Notes Recorded by Glean Hess, CMA on 10/26/2014 at 2:08 PM lmtcb 10/26/14 ------  Notes Recorded by Deneise Lever, MD on 10/21/2014 at 5:00 PM CXR- looks stable,except that the pleural plaques look more prominent. I would like to schedule an outpatient CXR in 6 months to look again at these, rather than waiting a year.  Dx history of asbestos exposure   Called spoke with patient and discussed cxr results/recs as stated by CY above. Pt voiced his understanding and denied any questions/concerns at this time. Appt scheduled with CY on 7.12.16 @ 0915; pt will arrive approx 74mins early to have cxr prior to appt (order already placed STAT). Pt is aware to call the office with any questions/concerns prior to appt.  Nothing further needed at this time; will sign off.

## 2014-11-04 ENCOUNTER — Telehealth: Payer: Self-pay | Admitting: Internal Medicine

## 2014-11-04 NOTE — Telephone Encounter (Signed)
CXR- looks stable,except that the pleural plaques look more prominent. I would like to schedule an outpatient CXR in 6 months to look again at these, rather than waiting a year.  Dx history of asbestos exposure ------------------------------------------------------  Spoke with pt, he is aware.  Nothing further needed.

## 2014-11-04 NOTE — Progress Notes (Signed)
Quick Note:  Pt aware of results. ______ 

## 2014-11-14 NOTE — Assessment & Plan Note (Signed)
He denies active symptoms now. His only sleep complaint has been difficulty initiating and maintaining sleep/insomnia.

## 2014-11-14 NOTE — Assessment & Plan Note (Signed)
Long-term remission with no concern

## 2014-11-14 NOTE — Assessment & Plan Note (Signed)
Plan-update chest x-ray

## 2014-11-14 NOTE — Assessment & Plan Note (Signed)
He did not have lower respiratory problems even during the time he was aware of nasal congestion in the fall.

## 2014-11-14 NOTE — Assessment & Plan Note (Signed)
This waxes and wanes for him. Not asking intervention today.

## 2014-11-22 DIAGNOSIS — N400 Enlarged prostate without lower urinary tract symptoms: Secondary | ICD-10-CM | POA: Diagnosis not present

## 2014-11-22 DIAGNOSIS — F5221 Male erectile disorder: Secondary | ICD-10-CM | POA: Diagnosis not present

## 2014-11-22 DIAGNOSIS — S76212A Strain of adductor muscle, fascia and tendon of left thigh, initial encounter: Secondary | ICD-10-CM | POA: Diagnosis not present

## 2014-12-16 ENCOUNTER — Ambulatory Visit (INDEPENDENT_AMBULATORY_CARE_PROVIDER_SITE_OTHER): Payer: Medicare Other | Admitting: Cardiology

## 2014-12-16 ENCOUNTER — Encounter: Payer: Self-pay | Admitting: Cardiology

## 2014-12-16 VITALS — BP 145/79 | HR 61 | Ht 69.0 in | Wt 222.4 lb

## 2014-12-16 DIAGNOSIS — R0789 Other chest pain: Secondary | ICD-10-CM

## 2014-12-16 DIAGNOSIS — Z77090 Contact with and (suspected) exposure to asbestos: Secondary | ICD-10-CM

## 2014-12-16 DIAGNOSIS — I471 Supraventricular tachycardia: Secondary | ICD-10-CM

## 2014-12-16 DIAGNOSIS — H9192 Unspecified hearing loss, left ear: Secondary | ICD-10-CM

## 2014-12-16 NOTE — Assessment & Plan Note (Signed)
He has not had any recurrent palpitations. No further workup.

## 2014-12-16 NOTE — Assessment & Plan Note (Signed)
He is being followed very carefully by pulmonary.

## 2014-12-16 NOTE — Assessment & Plan Note (Signed)
In addition to decreased hearing, the patient has a roaring sensation in his ears. This has been evaluated by several physicians and no help has been found at this time.

## 2014-12-16 NOTE — Progress Notes (Signed)
Cardiology Office Note   Date:  12/16/2014   ID:  Julian Fowler, DOB [birth year], MRN [id number]  PCP:  [name], MD  Cardiologist:  Dola Argyle, MD   Chief Complaint  Patient presents with  . Appointment    Follow-up hypertension      History of Present Illness: Julian Fowler is a 80 y.o. male who presents today for follow-up of hypertension and palpitations. He does mention some chest discomfort that he has had on the left side of his chest at night. This does not occur when he is swimming. He is fully active. The discomfort does not sound cardiac in origin.    Past Medical History  Diagnosis Date  . GERD (gastroesophageal reflux disease)   . Hyperlipidemia     under control  . Hypertension December 29, 2009    renal artery Doppler , normal  12/2009  . Abdominal aorta injury     Normal size, ultrasound, March 17,2011  . Asbestos exposure Jan 2001    Hx of asbestos related pleural plaques  . Overactive bladder   . ED (erectile dysfunction)   . Arthritis   . Hearing loss in left ear   . Allergic rhinitis   . Ejection fraction     60%, echo, 2008, mild RV dysfunction  . Easy bruisability   . BPH (benign prostatic hyperplasia)   . History of shingles     "mild"  . H/O hiatal hernia   . Tinnitus   . Balance problem   . Complication of anesthesia     "trouble waking up"  . Sleep apnea     was dx 10 years ago reports lost a lot of weight and no longer machine    Past Surgical History  Procedure Laterality Date  . Craniectomy for excision of acoustic neuroma  3/95  . Trigger finger release  2003    (thumb) middle finger (2006)  . Cerebral embolization  12/2011    "radiation therapy-did not work"  . Brain surgery    . Total knee arthroplasty Left 07/06/2013    Procedure: LEFT TOTAL KNEE ARTHROPLASTY;  Surgeon: Gearlean Alf, MD;  Location: [facility];  Service: Orthopedics;  Laterality: Left;  . Cholecystectomy  6/98  . Back surgery  2014    herniated  L1, L2   Dr Carloyn Manner  . Total knee arthroplasty Right 01/18/2014    Procedure: RIGHT TOTAL KNEE ARTHROPLASTY;  Surgeon: Gearlean Alf, MD;  Location: [facility];  Service: Orthopedics;  Laterality: Right;  . Radiology with anesthesia N/A 07/07/2014    Procedure: EMBOLIZATION;  Surgeon: Rob Hickman, MD;  Location: [facility];  Service: Radiology;  Laterality: N/A;    Patient Active Problem List   Diagnosis Date Noted  . 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 Peripheral edema 10/15/2013  . Hyponatremia 07/07/2013  . Hypokalemia 07/07/2013  . OA (osteoarthritis) of knee 07/06/2013  . Preop cardiovascular exam 06/26/2013  . Chronic insomnia 09/29/2012  . Cardiac murmur 08/27/2012  . Hearing impaired 04/10/2012  . Easy bruisability   . Anxiety   . Hyperlipidemia   . Abdominal aorta injury   . Asbestos exposure   . Overactive bladder   . ED (erectile dysfunction)   . SVT (supraventricular tachycardia)   . OSA (obstructive sleep apnea)   . Chest tightness   . Ejection fraction   . Hypertension 12/29/2009  . Syncope 09/14/2009  . COPD 07/15/2009  . TOBACCO ABUSE, HX OF 07/15/2009  .  BACK PAIN 06/22/2009  . INTESTINAL GAS 06/22/2009  . 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 ALLERGIC RHINITIS 01/15/2008  . ECZEMA 11/18/2007  . Asthma, mild intermittent, well-controlled 11/03/2007  . GERD 04/14/2007  . ARTHRITIS 04/14/2007  . KNEE PAIN, LEFT 04/14/2007      Current Outpatient Prescriptions  Medication Sig Dispense Refill  . acetaminophen (TYLENOL) 325 MG tablet Take 325 mg by mouth every 6 (six) hours as needed for pain.     Marland Kitchen amLODipine (NORVASC) 2.5 MG tablet Take 1 tablet (2.5 mg total) by mouth every morning. 90 tablet 3  . Calcium Carb-Vit D-C-E-Mineral (OS-CAL ULTRA) 600 MG TABS Take 1 tablet by mouth.    . diltiazem (CARDIZEM CD) 120 MG 24 hr capsule Take 1 capsule (120 mg total) by mouth 2 (two) times daily. 180 capsule 3  . docusate sodium 100 MG  CAPS Take 100 mg by mouth 2 (two) times daily. 10 capsule 0  . fexofenadine (ALLEGRA) 180 MG tablet Take 180 mg by mouth daily as needed. For allergies    . fluticasone (FLONASE) 50 MCG/ACT nasal spray Place 2 sprays into both nostrils daily.    . furosemide (LASIX) 40 MG tablet Take 1 tablet by mouth daily.    Marland Kitchen latanoprost (XALATAN) 0.005 % ophthalmic solution Place 1 drop into both eyes at bedtime.     Marland Kitchen LORazepam (ATIVAN) 1 MG tablet Take 1-2 tablets (1-2 mg total) by mouth at bedtime as needed for anxiety. 180 tablet 0  . losartan (COZAAR) 100 MG tablet Take 100 mg by mouth every morning.     . Multiple Vitamins-Minerals (CENTRUM SILVER PO) Take 1 tablet by mouth.    Marland Kitchen omeprazole (PRILOSEC) 20 MG capsule Take 20 mg by mouth daily. Take 1 tablet once daily thirty minutes before a meal    . potassium chloride SA (K-DUR,KLOR-CON) 20 MEQ tablet Take 1 tablet (20 mEq total) by mouth daily. 90 tablet 3  . pravastatin (PRAVACHOL) 40 MG tablet Take 1 tablet by mouth daily.    . vitamin A 8000 UNIT capsule Take 8,000 Units by mouth daily.    Marland Kitchen zolpidem (AMBIEN) 10 MG tablet Take 10 mg by mouth at bedtime.      No current facility-administered medications for this visit.    Allergies:   Codeine and Tape    Social History:  The patient  reports that he quit smoking about 28 years ago. His smoking use included Cigarettes. He has a 45 pack-year smoking history. He has never used smokeless tobacco. He reports that he does not drink alcohol or use illicit drugs.   Family History:  The patient's family history includes Alcohol abuse in his sister; Alzheimer's disease in his sister; Breast cancer in his mother; Cancer in his father; Heart attack in his mother; Hypertension in his sister; Kidney disease in his sister.    ROS:  Please see the history of present illness.    Patient denies fever, chills, headache, sweats, rash, change in vision, change in hearing, cough, nausea or vomiting, urinary  symptoms. All other systems are reviewed and are negative.    PHYSICAL EXAM: VS:  BP 145/79 mmHg  Pulse 61  Ht 5\' 9"  (1.753 m)  Wt 222 lb 6.4 oz (100.88 kg)  BMI 32.83 kg/m2  SpO2 62% , Patient is here with his wife. He is oriented to person time and place. Affect is normal. Head is atraumatic. Sclera and conjunctiva are normal. There is no jugulovenous  distention. Lungs are clear. Respiratory effort is nonlabored. Cardiac exam reveals S1 and S2. The abdomen is soft. There is no peripheral edema. No musculoskeletal deformities. No skin rashes.  EKG:   EKG is not done today.   Recent Labs: 07/01/2014: ALT 16; BUN 13; Creatinine 0.90; Hemoglobin 14.2; Platelets 315; Potassium 4.3; Sodium 137    Lipid Panel    Component Value Date/Time   CHOL 182 01/06/2009 2252   TRIG 128 01/06/2009 2252   HDL 44 01/06/2009 2252   CHOLHDL 4.1 Ratio 01/06/2009 2252   VLDL 26 01/06/2009 2252   LDLCALC 112* 01/06/2009 2252      Wt Readings from Last 3 Encounters:  12/16/14 222 lb 6.4 oz (100.88 kg)  10/21/14 224 lb 6.4 oz (101.787 kg)  07/07/14 220 lb (99.791 kg)      Current medicines are reviewed       ASSESSMENT AND PLAN:

## 2014-12-16 NOTE — Assessment & Plan Note (Signed)
At this time the patient's cardiac status is stable. His current chest discomfort does not sound cardiac in origin. I've chosen not to receipt with any further testing at this time.

## 2014-12-16 NOTE — Patient Instructions (Signed)

## 2014-12-21 DIAGNOSIS — E782 Mixed hyperlipidemia: Secondary | ICD-10-CM | POA: Diagnosis not present

## 2014-12-21 DIAGNOSIS — I10 Essential (primary) hypertension: Secondary | ICD-10-CM | POA: Diagnosis not present

## 2014-12-21 DIAGNOSIS — R7301 Impaired fasting glucose: Secondary | ICD-10-CM | POA: Diagnosis not present

## 2014-12-27 DIAGNOSIS — H4011X2 Primary open-angle glaucoma, moderate stage: Secondary | ICD-10-CM | POA: Diagnosis not present

## 2014-12-28 DIAGNOSIS — I10 Essential (primary) hypertension: Secondary | ICD-10-CM | POA: Diagnosis not present

## 2014-12-28 DIAGNOSIS — I671 Cerebral aneurysm, nonruptured: Secondary | ICD-10-CM | POA: Diagnosis not present

## 2014-12-28 DIAGNOSIS — J302 Other seasonal allergic rhinitis: Secondary | ICD-10-CM | POA: Diagnosis not present

## 2014-12-28 DIAGNOSIS — M1991 Primary osteoarthritis, unspecified site: Secondary | ICD-10-CM | POA: Diagnosis not present

## 2014-12-28 DIAGNOSIS — N400 Enlarged prostate without lower urinary tract symptoms: Secondary | ICD-10-CM | POA: Diagnosis not present

## 2014-12-28 DIAGNOSIS — E871 Hypo-osmolality and hyponatremia: Secondary | ICD-10-CM | POA: Diagnosis not present

## 2014-12-28 DIAGNOSIS — R7301 Impaired fasting glucose: Secondary | ICD-10-CM | POA: Diagnosis not present

## 2014-12-28 DIAGNOSIS — H9319 Tinnitus, unspecified ear: Secondary | ICD-10-CM | POA: Diagnosis not present

## 2014-12-28 DIAGNOSIS — Z1389 Encounter for screening for other disorder: Secondary | ICD-10-CM | POA: Diagnosis not present

## 2014-12-28 DIAGNOSIS — E782 Mixed hyperlipidemia: Secondary | ICD-10-CM | POA: Diagnosis not present

## 2015-01-18 DIAGNOSIS — L259 Unspecified contact dermatitis, unspecified cause: Secondary | ICD-10-CM | POA: Diagnosis not present

## 2015-01-18 DIAGNOSIS — L570 Actinic keratosis: Secondary | ICD-10-CM | POA: Diagnosis not present

## 2015-01-21 ENCOUNTER — Other Ambulatory Visit: Payer: Self-pay | Admitting: Internal Medicine

## 2015-01-21 ENCOUNTER — Other Ambulatory Visit: Payer: Self-pay | Admitting: *Deleted

## 2015-02-10 ENCOUNTER — Other Ambulatory Visit: Payer: Self-pay | Admitting: *Deleted

## 2015-02-10 MED ORDER — DILTIAZEM HCL ER COATED BEADS 120 MG PO CP24: 120.0000 mg | ORAL_CAPSULE | Freq: Two times a day (BID) | ORAL | Status: DC

## 2015-03-02 DIAGNOSIS — D692 Other nonthrombocytopenic purpura: Secondary | ICD-10-CM | POA: Diagnosis not present

## 2015-03-02 DIAGNOSIS — L570 Actinic keratosis: Secondary | ICD-10-CM | POA: Diagnosis not present

## 2015-03-24 DIAGNOSIS — N400 Enlarged prostate without lower urinary tract symptoms: Secondary | ICD-10-CM | POA: Diagnosis not present

## 2015-03-24 DIAGNOSIS — E782 Mixed hyperlipidemia: Secondary | ICD-10-CM | POA: Diagnosis not present

## 2015-03-24 DIAGNOSIS — R7301 Impaired fasting glucose: Secondary | ICD-10-CM | POA: Diagnosis not present

## 2015-03-24 DIAGNOSIS — I10 Essential (primary) hypertension: Secondary | ICD-10-CM | POA: Diagnosis not present

## 2015-03-24 DIAGNOSIS — E871 Hypo-osmolality and hyponatremia: Secondary | ICD-10-CM | POA: Diagnosis not present

## 2015-03-25 ENCOUNTER — Other Ambulatory Visit: Payer: Self-pay | Admitting: *Deleted

## 2015-03-25 MED ORDER — AMLODIPINE BESYLATE 2.5 MG PO TABS: 2.5000 mg | ORAL_TABLET | Freq: Every morning | ORAL | Status: DC

## 2015-03-31 DIAGNOSIS — R7301 Impaired fasting glucose: Secondary | ICD-10-CM | POA: Diagnosis not present

## 2015-03-31 DIAGNOSIS — Z96652 Presence of left artificial knee joint: Secondary | ICD-10-CM | POA: Diagnosis not present

## 2015-03-31 DIAGNOSIS — J302 Other seasonal allergic rhinitis: Secondary | ICD-10-CM | POA: Diagnosis not present

## 2015-03-31 DIAGNOSIS — E871 Hypo-osmolality and hyponatremia: Secondary | ICD-10-CM | POA: Diagnosis not present

## 2015-03-31 DIAGNOSIS — R351 Nocturia: Secondary | ICD-10-CM | POA: Diagnosis not present

## 2015-03-31 DIAGNOSIS — F5221 Male erectile disorder: Secondary | ICD-10-CM | POA: Diagnosis not present

## 2015-03-31 DIAGNOSIS — I10 Essential (primary) hypertension: Secondary | ICD-10-CM | POA: Diagnosis not present

## 2015-03-31 DIAGNOSIS — N400 Enlarged prostate without lower urinary tract symptoms: Secondary | ICD-10-CM | POA: Diagnosis not present

## 2015-03-31 DIAGNOSIS — E782 Mixed hyperlipidemia: Secondary | ICD-10-CM | POA: Diagnosis not present

## 2015-03-31 DIAGNOSIS — M1991 Primary osteoarthritis, unspecified site: Secondary | ICD-10-CM | POA: Diagnosis not present

## 2015-03-31 DIAGNOSIS — I671 Cerebral aneurysm, nonruptured: Secondary | ICD-10-CM | POA: Diagnosis not present

## 2015-03-31 DIAGNOSIS — H9319 Tinnitus, unspecified ear: Secondary | ICD-10-CM | POA: Diagnosis not present

## 2015-04-26 ENCOUNTER — Ambulatory Visit: Payer: Medicare Other | Admitting: Internal Medicine

## 2015-05-03 ENCOUNTER — Ambulatory Visit: Payer: Medicare Other | Admitting: Internal Medicine

## 2015-05-03 ENCOUNTER — Ambulatory Visit (INDEPENDENT_AMBULATORY_CARE_PROVIDER_SITE_OTHER)
Admission: RE | Admit: 2015-05-03 | Discharge: 2015-05-03 | Disposition: A | Discharge status: Home / Self Care | Payer: Medicare Other | Source: Ambulatory Visit | Attending: Internal Medicine | Admitting: Internal Medicine

## 2015-05-03 ENCOUNTER — Encounter: Payer: Self-pay | Admitting: Internal Medicine

## 2015-05-03 VITALS — BP 126/70 | HR 60 | Ht 69.5 in | Wt 224.2 lb

## 2015-05-03 DIAGNOSIS — J449 Chronic obstructive pulmonary disease, unspecified: Secondary | ICD-10-CM

## 2015-05-03 DIAGNOSIS — J929 Pleural plaque without asbestos: Secondary | ICD-10-CM | POA: Diagnosis not present

## 2015-05-03 DIAGNOSIS — Z77090 Contact with and (suspected) exposure to asbestos: Secondary | ICD-10-CM

## 2015-05-03 DIAGNOSIS — Z23 Encounter for immunization: Secondary | ICD-10-CM

## 2015-05-03 DIAGNOSIS — J61 Pneumoconiosis due to asbestos and other mineral fibers: Secondary | ICD-10-CM

## 2015-05-03 NOTE — Patient Instructions (Addendum)
Order- office spirometry     Dx asthma with COPD  Prevnar 13 vaccine   Order future CXR next visit    Dx Asbestosis

## 2015-05-03 NOTE — Progress Notes (Signed)
07/26/11- 80 year old male former smoker followed for allergic rhinitis, asthma, COPD, obstructive sleep apnea, history of asbestos exposure/plaques. Last here- 07/25/2010  Has had flu vaccine. He had been swimming regularly and had kept his weight down. Had surgery on his hand, that hurt his shoulder and has not been swimming is much. Said previously he could swim the length of the pool underwater. Needing lorazepam still to sleep, especially for sleep onset. Since he lost weight he has not needed CPAP. For the last week and a half has had pain across his upper mid back if he stands up straight. This is not affected by lying supine or by coughing.  09/23/12- 80 year old male former smoker followed for allergic rhinitis, asthma, COPD, obstructive sleep apnea, history of asbestos exposure/plaques. 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 Partially managed with interventional radiology placing coils. Gamma knife radiation therapy at [facility] did not help. Still swimming regularly. Denies cough, chest pain, shortness of breath. Insomnia has been more troublesome this year, partly because of stress from the above issues. Johnnye Sima it just made him groggy. Combination of Ambien 5 mg plus lorazepam 1 mg helps fairly well. Still wakes during the night. We discussed potential that he could tolerate lorazepam 2 mg. He denies nighttime confusion, sleepwalking or morning hangover. He has not recognized sleep apnea as an issue since he lost weight years ago  09/24/13- 80 year old male former smoker followed for allergic rhinitis, asthma, COPD, hx obstructive sleep apnea, history of asbestos exposure/plaques. Chronic insomnia FOLLOWS FOR: denies any trouble with  breathing or sleep-continues to use sleeping meds. Has had back surgery and total knee replacement since last here. Left calf now swells-checked twice for DVT and maintained on diuretic. Continues antihistamines for rhinitis. Sleeping well with medication. CXR 09/23/12 IMPRESSION:  Stable bilateral pleural plaques consistent with a history of  previous asbestos exposure.  No acute cardiopulmonary disease. No change from prior study.  Original Report Authenticated By: Lajean Manes, M.D.  10/21/14- 80 year old male former smoker followed for allergic rhinitis, asthma, COPD, hx obstructive sleep apnea, history of asbestos exposure/plaques. Chronic insomnia Much "sinus" this fall blamed on damp weather. Doing much better now. Since last year had right total knee replacement uncomplicated. Still swimming 3 days per week. He stopped using CPAP years ago when he lost significant weight. CXR 09/24/13 IMPRESSION: Stable bilateral chronic changes consistent with patient's history of asbestos exposure. No acute abnormalities identified. Electronically Signed  By: Margaree Mackintosh M.D.  On: 09/24/2013 12:50  05/03/15-  80 year old male former smoker followed for allergic rhinitis, asthma, COPD, hx obstructive sleep apnea, history of asbestos exposure/plaques. Chronic insomnia FOLLOWS FOR: Pt states he continues to swim as usual(about 1 hour out of 2 hours in being in water)-Denies any productive cough or SOB. Has had flare up of allergies.   CXR 05/03/15 IMPRESSION: Areas of diaphragmatic calcification and pleural plaque bilaterally appear stable compared to January 2016 study. No edema or consolidation. No mass or adenopathy appreciable. Electronically Signed  By: Lowella Grip III M.D.  On: 05/03/2015 09:35  ROS See HPI Constitutional:   No-   weight loss, night sweats, fevers, chills, fatigue, lassitude. HEENT:   + headaches,  No -difficulty swallowing, tooth/dental problems, sore  throat,       No-  sneezing, itching, ear ache, nasal congestion, post nasal drip,  CV:  No-   chest pain, orthopnea, PND, swelling in lower extremities, no-anasarca, dizziness, palpitations Resp: No-   shortness of breath with exertion or at rest.              No-   productive cough,  No non-productive cough,  No-  coughing up of blood.              No-   change in color of mucus.  No- wheezing.   Skin: No-   rash or lesions. GI:  No-   heartburn, indigestion, abdominal pain, nausea, vomiting,              GU:  MS:  + joint pain or swelling. .  + back pain. Neuro-  nothing unusual except per  HPI Psych:  No- change in mood or affect. No depression or anxiety.  No memory loss.  OBJ General- Alert, Oriented, Affect-appropriate, Distress- none acute Skin- rash-none, lesions- none, excoriation- none,  Lymphadenopathy- none Head- atraumatic            Eyes- Gross vision intact, PERRLA, conjunctivae clear secretions            Ears- +hard of hearing            Nose- Clear, no-Septal dev, mucus, polyps, erosion, perforation             Throat- Mallampati II , mucosa clear , drainage- none, tonsils- atrophic. Dental repair. Neck- flexible , trachea midline, no stridor , thyroid nl, carotid no bruit Chest - symmetrical excursion , unlabored           Heart/CV- slow RRR , no murmur , no gallop  , no rub, nl s1 s2                           - JVD- none , edema- none, stasis changes- none, varices- none           Lung- clear to P&A, wheeze- none, cough- none , dullness-none, rub- none. I cannot hear fine crackles or rub.           Chest wall-  Abd-  Br/ Gen/ Rectal- Not done, not indicated Extrem- +edema and erythema left calf/ankle,  Neuro- grossly intact to observation. Speech is clear.

## 2015-05-06 DIAGNOSIS — H4011X1 Primary open-angle glaucoma, mild stage: Secondary | ICD-10-CM | POA: Diagnosis not present

## 2015-05-06 DIAGNOSIS — H00021 Hordeolum internum right upper eyelid: Secondary | ICD-10-CM | POA: Diagnosis not present

## 2015-05-06 DIAGNOSIS — H11153 Pinguecula, bilateral: Secondary | ICD-10-CM | POA: Diagnosis not present

## 2015-05-18 ENCOUNTER — Telehealth: Payer: Self-pay | Admitting: *Deleted

## 2015-05-18 DIAGNOSIS — S46011A Strain of muscle(s) and tendon(s) of the rotator cuff of right shoulder, initial encounter: Secondary | ICD-10-CM | POA: Diagnosis not present

## 2015-05-18 NOTE — Telephone Encounter (Signed)
Contacted [facility] to get pricing for diltiazem 120 mg twice daily. Per Levada Dy at [place] a 52 day supply through insurance is $91.80 and used to be $41.00 Contacted [facility] and their cash pay price is $43.27 per month.  [place] and their pricing for cash pay was $100 + dollars. [facility] offered a cash pay price for the 240 mg at $24.05 per month. Patient informed and requested that we check with [facility] to see what their pricing is for the 240 mg capsules. Contacted [facility] and diltiazem 240 mg 1 mth supply $26.87. Patient will be contacted later to inform him to get a month supply and will be given a follow up appointment with Dr. Ron Parker and this can be discussed during that visit.

## 2015-05-18 NOTE — Telephone Encounter (Signed)
Patient informed to stay on the same dosage for now and he can discuss any medication changes at his appointment with Dr. Ron Parker on 06/15/15.

## 2015-05-23 ENCOUNTER — Telehealth: Payer: Self-pay | Admitting: Internal Medicine

## 2015-05-25 NOTE — Telephone Encounter (Signed)
Ok to refill 

## 2015-05-25 NOTE — Telephone Encounter (Signed)
Ativan 1mg  (TAKE ONE TO TWO TABLETS BY MOUTH AT BEDTIME AS NEEDED) called into [place]. Nothing further needed.

## 2015-05-25 NOTE — Telephone Encounter (Signed)
Requesting refill on Ativan Last refill: 01/21/15 Last OV: 05/03/15 Next OV: 05/03/16  Ok to refill?

## 2015-06-15 ENCOUNTER — Encounter: Payer: Self-pay | Admitting: Cardiology

## 2015-06-15 ENCOUNTER — Ambulatory Visit (INDEPENDENT_AMBULATORY_CARE_PROVIDER_SITE_OTHER): Payer: Medicare Other | Admitting: Cardiology

## 2015-06-15 VITALS — BP 160/78 | HR 53 | Ht 69.0 in | Wt 225.8 lb

## 2015-06-15 DIAGNOSIS — R079 Chest pain, unspecified: Secondary | ICD-10-CM | POA: Diagnosis not present

## 2015-06-15 DIAGNOSIS — I471 Supraventricular tachycardia: Secondary | ICD-10-CM | POA: Diagnosis not present

## 2015-06-15 DIAGNOSIS — F419 Anxiety disorder, unspecified: Secondary | ICD-10-CM | POA: Diagnosis not present

## 2015-06-15 DIAGNOSIS — R0789 Other chest pain: Secondary | ICD-10-CM | POA: Diagnosis not present

## 2015-06-15 DIAGNOSIS — I10 Essential (primary) hypertension: Secondary | ICD-10-CM

## 2015-06-15 NOTE — Assessment & Plan Note (Addendum)
The patient has had chest discomfort over time. Catheterization has never been done. He had a normal nuclear scan in 2009. He had a nuclear scan in August, 2014 that was done elsewhere. Wall motion and the ejection fraction were normal. There were questionable some fixed defects but no evidence of ischemia. It was my opinion at that time that there was no proof of ischemia. Catheterization was not done. He has some localized left anterior chest discomfort at nighttime. It does not occur with exertion. I'm not convinced that it represents ischemia. No further workup at this time.

## 2015-06-15 NOTE — Assessment & Plan Note (Signed)
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 He has not had any significant recurrence. No change in therapy.

## 2015-06-15 NOTE — Progress Notes (Signed)
Cardiology Office Note   Date:  06/15/2015   ID:  HARMON BOMMARITO, DOB 1935-08-26, MRN [id number]  PCP:  [name], MD  Cardiologist:  Dola Argyle, MD   Chief Complaint  Patient presents with  . Appointment    Follow-up hypertension and palpitations      History of Present Illness: Julian Fowler is a 80 y.o. male who presents today to follow-up hypertension and palpitations. In the past he had an episode of supraventricular tachycardia. 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 He is been on diltiazem and he is been stable with this. His blood pressure has been treated and has been controlled. He has a localized left anterior chest discomfort at night. I feel that it is not cardiac. He has many stressors at this time including the loss of family members with medical problems and the sudden loss of his son recently. He is not having any exertional chest pain. He exercises regularly.    Past Medical History  Diagnosis Date  . GERD (gastroesophageal reflux disease)   . Hyperlipidemia     under control  . Hypertension December 29, 2009    renal artery Doppler , normal  12/2009  . Abdominal aorta injury     Normal size, ultrasound, March 17,2011  . Asbestos exposure Jan 2001    Hx of asbestos related pleural plaques  . Overactive bladder   . ED (erectile dysfunction)   . Arthritis   . Hearing loss in left ear   . Allergic rhinitis   . Ejection fraction     60%, echo, 2008, mild RV dysfunction  . Easy bruisability   . BPH (benign prostatic hyperplasia)   . History of shingles     "mild"  . H/O hiatal hernia   . Tinnitus   . Balance problem   . Complication of anesthesia     "trouble waking up"  . Sleep apnea     was dx 10 years ago reports lost a lot of weight and no longer machine    Past Surgical History  Procedure Laterality Date  . Craniectomy for excision of acoustic neuroma  3/95  . Trigger finger release  2003    (thumb) middle finger  (2006)  . Cerebral embolization  12/2011    "radiation therapy-did not work"  . Brain surgery    . Total knee arthroplasty Left 07/06/2013    Procedure: LEFT TOTAL KNEE ARTHROPLASTY;  Surgeon: Gearlean Alf, MD;  Location: [facility];  Service: Orthopedics;  Laterality: Left;  . Cholecystectomy  6/98  . Back surgery  2014    herniated L1, L2   Dr Carloyn Manner  . Total knee arthroplasty Right 01/18/2014    Procedure: RIGHT TOTAL KNEE ARTHROPLASTY;  Surgeon: Gearlean Alf, MD;  Location: [facility];  Service: Orthopedics;  Laterality: Right;  . Radiology with anesthesia N/A 07/07/2014    Procedure: EMBOLIZATION;  Surgeon: Rob Hickman, MD;  Location: [facility];  Service: Radiology;  Laterality: N/A;    Patient Active Problem List   Diagnosis Date Noted  . 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 Peripheral edema 10/15/2013  . Hyponatremia 07/07/2013  . Hypokalemia 07/07/2013  . OA (osteoarthritis) of knee 07/06/2013  . Preop cardiovascular exam 06/26/2013  . Chronic insomnia 09/29/2012  . Cardiac murmur 08/27/2012  . Hearing impaired 04/10/2012  . Easy bruisability   . Anxiety   . Hyperlipidemia   . Abdominal aorta injury   . Asbestos exposure   .  Overactive bladder   . ED (erectile dysfunction)   . SVT (supraventricular tachycardia)   . OSA (obstructive sleep apnea)   . Chest tightness   . Ejection fraction   . Hypertension 12/29/2009  . Syncope 09/14/2009  . COPD 07/15/2009  . TOBACCO ABUSE, HX OF 07/15/2009  . BACK PAIN 06/22/2009  . INTESTINAL GAS 06/22/2009  . 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 ALLERGIC RHINITIS 01/15/2008  . ECZEMA 11/18/2007  . Asthma, mild intermittent, well-controlled 11/03/2007  . GERD 04/14/2007  . ARTHRITIS 04/14/2007  . KNEE PAIN, LEFT 04/14/2007      Current Outpatient Prescriptions  Medication Sig Dispense Refill  . acetaminophen (TYLENOL) 325 MG tablet Take 325 mg by mouth every 6 (six) hours as needed for pain.       Marland Kitchen amLODipine (NORVASC) 2.5 MG tablet Take 1 tablet (2.5 mg total) by mouth every morning. 90 tablet 3  . Calcium Carb-Vit D-C-E-Mineral (OS-CAL ULTRA) 600 MG TABS Take 1 tablet by mouth.    . diltiazem (CARDIZEM CD) 120 MG 24 hr capsule Take 1 capsule (120 mg total) by mouth 2 (two) times daily. 180 capsule 3  . docusate sodium 100 MG CAPS Take 100 mg by mouth 2 (two) times daily. 10 capsule 0  . esomeprazole (NEXIUM) 40 MG capsule Take 40 mg by mouth daily at 12 noon.    . fexofenadine (ALLEGRA) 180 MG tablet Take 180 mg by mouth daily as needed. For allergies    . fluticasone (FLONASE) 50 MCG/ACT nasal spray Place 2 sprays into both nostrils daily.    . furosemide (LASIX) 40 MG tablet Take 1 tablet by mouth daily.    Marland Kitchen latanoprost (XALATAN) 0.005 % ophthalmic solution Place 1 drop into both eyes at bedtime.     Marland Kitchen LORazepam (ATIVAN) 1 MG tablet TAKE ONE TO TWO TABLETS BY MOUTH AT BEDTIME AS NEEDED 180 tablet 0  . losartan (COZAAR) 100 MG tablet Take 100 mg by mouth every morning.     . Multiple Vitamins-Minerals (CENTRUM SILVER PO) Take 1 tablet by mouth.    . potassium chloride SA (K-DUR,KLOR-CON) 20 MEQ tablet Take 1 tablet (20 mEq total) by mouth daily. 90 tablet 3  . pravastatin (PRAVACHOL) 40 MG tablet Take 1 tablet by mouth daily.    . vitamin A 8000 UNIT capsule Take 8,000 Units by mouth daily.    Marland Kitchen zolpidem (AMBIEN) 10 MG tablet Take 10 mg by mouth at bedtime.      No current facility-administered medications for this visit.    Allergies:   Codeine and Tape    Social History:  The patient  reports that he quit smoking about 28 years ago. His smoking use included Cigarettes. He has a 45 pack-year smoking history. He has never used smokeless tobacco. He reports that he does not drink alcohol or use illicit drugs.   Family History:  The patient's family history includes Alcohol abuse in his sister; Alzheimer's disease in his sister; Breast cancer in his mother; Cancer in his father;  Heart attack in his mother; Hypertension in his sister; Kidney disease in his sister.    ROS:  Please see the history of present illness.     Patient denies fever, chills, headache, sweats, rash, change in vision, cough, nausea or vomiting, urinary symptoms. He is bothered by rolling sensation in his ear. He will be evaluated further by Dr. Thornell Mule tomorrow. All other systems are reviewed and are negative.   PHYSICAL EXAM:  VS:  BP 160/78 mmHg  Pulse 53  Ht 5\' 9"  (1.753 m)  Wt 225 lb 12.8 oz (102.422 kg)  BMI 33.33 kg/m2  SpO2 98% , The patient is here with his wife. He is oriented to person time and place. He is anxious over all of his recent stressors. Head is atraumatic. Sclera and conjunctiva are normal. There is no jugular venous distention. Lungs are clear. Respiratory effort is nonlabored. Cardiac exam reveals S1 and S2. The rhythm is regular. Abdomen is soft. There is no peripheral edema. There are no musculoskeletal form is. There are no skin rashes.   EKG:   EKG is done today and reviewed by me. There is mild sinus bradycardia. The QRS is normal.   Recent Labs: 07/01/2014: ALT 16; BUN 13; Creatinine, Ser 0.90; Hemoglobin 14.2; Platelets 315; Potassium 4.3; Sodium 137    Lipid Panel    Component Value Date/Time   CHOL 182 01/06/2009 2252   TRIG 128 01/06/2009 2252   HDL 44 01/06/2009 2252   CHOLHDL 4.1 Ratio 01/06/2009 2252   VLDL 26 01/06/2009 2252   LDLCALC 112* 01/06/2009 2252      Wt Readings from Last 3 Encounters:  06/15/15 225 lb 12.8 oz (102.422 kg)  05/03/15 224 lb 3.2 oz (101.696 kg)  12/16/14 222 lb 6.4 oz (100.88 kg)      Current medicines are reviewed  The patient understands his meds.   ASSESSMENT AND PLAN:

## 2015-06-15 NOTE — Assessment & Plan Note (Signed)
Blood pressure is controlled on his current meds. No change in therapy.

## 2015-06-15 NOTE — Assessment & Plan Note (Signed)
The patient has difficulties with anxiety. He has had several losses in his family and he is more anxious today. He will continue to take his Ativan.

## 2015-06-15 NOTE — Patient Instructions (Signed)
Your physician recommends that you continue on your current medications as directed. Please refer to the Current Medication list given to you today. Your physician recommends that you schedule a follow-up appointment in: 6 months with Dr. Koneswaran. You will receive a reminder letter in the mail in about 4 months reminding you to call and schedule your appointment. If you don't receive this letter, please contact our office. 

## 2015-06-16 DIAGNOSIS — T81719A Complication of unspecified artery following a procedure, not elsewhere classified, initial encounter: Secondary | ICD-10-CM | POA: Diagnosis not present

## 2015-06-16 DIAGNOSIS — H903 Sensorineural hearing loss, bilateral: Secondary | ICD-10-CM | POA: Diagnosis not present

## 2015-06-16 DIAGNOSIS — H60503 Unspecified acute noninfective otitis externa, bilateral: Secondary | ICD-10-CM | POA: Diagnosis not present

## 2015-06-16 DIAGNOSIS — H6123 Impacted cerumen, bilateral: Secondary | ICD-10-CM | POA: Diagnosis not present

## 2015-06-16 DIAGNOSIS — H9313 Tinnitus, bilateral: Secondary | ICD-10-CM | POA: Diagnosis not present

## 2015-07-20 DIAGNOSIS — Z23 Encounter for immunization: Secondary | ICD-10-CM | POA: Diagnosis not present

## 2015-08-02 DIAGNOSIS — J019 Acute sinusitis, unspecified: Secondary | ICD-10-CM | POA: Diagnosis not present

## 2015-08-02 DIAGNOSIS — J302 Other seasonal allergic rhinitis: Secondary | ICD-10-CM | POA: Diagnosis not present

## 2015-08-29 DIAGNOSIS — H401111 Primary open-angle glaucoma, right eye, mild stage: Secondary | ICD-10-CM | POA: Diagnosis not present

## 2015-10-04 DIAGNOSIS — E782 Mixed hyperlipidemia: Secondary | ICD-10-CM | POA: Diagnosis not present

## 2015-10-04 DIAGNOSIS — N400 Enlarged prostate without lower urinary tract symptoms: Secondary | ICD-10-CM | POA: Diagnosis not present

## 2015-10-04 DIAGNOSIS — R7301 Impaired fasting glucose: Secondary | ICD-10-CM | POA: Diagnosis not present

## 2015-10-04 DIAGNOSIS — I10 Essential (primary) hypertension: Secondary | ICD-10-CM | POA: Diagnosis not present

## 2015-10-04 DIAGNOSIS — M1991 Primary osteoarthritis, unspecified site: Secondary | ICD-10-CM | POA: Diagnosis not present

## 2015-10-05 ENCOUNTER — Other Ambulatory Visit: Payer: Self-pay | Admitting: Cardiology

## 2015-10-13 DIAGNOSIS — H9319 Tinnitus, unspecified ear: Secondary | ICD-10-CM | POA: Diagnosis not present

## 2015-10-13 DIAGNOSIS — Z96652 Presence of left artificial knee joint: Secondary | ICD-10-CM | POA: Diagnosis not present

## 2015-10-13 DIAGNOSIS — I671 Cerebral aneurysm, nonruptured: Secondary | ICD-10-CM | POA: Diagnosis not present

## 2015-10-13 DIAGNOSIS — E782 Mixed hyperlipidemia: Secondary | ICD-10-CM | POA: Diagnosis not present

## 2015-10-13 DIAGNOSIS — Z96651 Presence of right artificial knee joint: Secondary | ICD-10-CM | POA: Diagnosis not present

## 2015-10-13 DIAGNOSIS — F5221 Male erectile disorder: Secondary | ICD-10-CM | POA: Diagnosis not present

## 2015-10-13 DIAGNOSIS — J302 Other seasonal allergic rhinitis: Secondary | ICD-10-CM | POA: Diagnosis not present

## 2015-10-13 DIAGNOSIS — Z23 Encounter for immunization: Secondary | ICD-10-CM | POA: Diagnosis not present

## 2015-10-13 DIAGNOSIS — E871 Hypo-osmolality and hyponatremia: Secondary | ICD-10-CM | POA: Diagnosis not present

## 2015-10-13 DIAGNOSIS — I10 Essential (primary) hypertension: Secondary | ICD-10-CM | POA: Diagnosis not present

## 2015-10-13 DIAGNOSIS — Z0001 Encounter for general adult medical examination with abnormal findings: Secondary | ICD-10-CM | POA: Diagnosis not present

## 2015-10-13 DIAGNOSIS — N400 Enlarged prostate without lower urinary tract symptoms: Secondary | ICD-10-CM | POA: Diagnosis not present

## 2015-10-15 ENCOUNTER — Other Ambulatory Visit: Payer: Self-pay | Admitting: Internal Medicine

## 2015-10-18 NOTE — Telephone Encounter (Signed)
CY Please advise on refill. Thanks.  

## 2015-10-18 NOTE — Telephone Encounter (Signed)
Ok to refill 

## 2015-10-19 NOTE — Telephone Encounter (Signed)
Received refill request for Ativan. Please advise if ok to refill. Thank you.

## 2015-10-19 NOTE — Telephone Encounter (Signed)
Duplication

## 2015-10-19 NOTE — Telephone Encounter (Signed)
Ok to refill 

## 2015-10-21 NOTE — Telephone Encounter (Signed)
Rx printed, signed by CY, and faxed to pharmacy as pharmacy line continues to ring busy and no dial tone on main number

## 2015-10-24 ENCOUNTER — Telehealth: Payer: Self-pay | Admitting: Internal Medicine

## 2015-10-24 MED ORDER — LORAZEPAM 1 MG PO TABS: 1.0000 mg | ORAL_TABLET | Freq: Every evening | ORAL | Status: DC | PRN

## 2015-10-24 NOTE — Telephone Encounter (Signed)
Refill request for Ativan Last refilled: 05/25/15 Last OV: 06/15/15 Next OV: 05/03/16  Ok to refill?  Current Outpatient Prescriptions on File Prior to Visit  Medication Sig Dispense Refill  . acetaminophen (TYLENOL) 325 MG tablet Take 325 mg by mouth every 6 (six) hours as needed for pain.     Marland Kitchen amLODipine (NORVASC) 2.5 MG tablet Take 1 tablet (2.5 mg total) by mouth every morning. 90 tablet 3  . Calcium Carb-Vit D-C-E-Mineral (OS-CAL ULTRA) 600 MG TABS Take 1 tablet by mouth.    . diltiazem (CARDIZEM CD) 120 MG 24 hr capsule Take 1 capsule (120 mg total) by mouth 2 (two) times daily. 180 capsule 3  . docusate sodium 100 MG CAPS Take 100 mg by mouth 2 (two) times daily. 10 capsule 0  . esomeprazole (NEXIUM) 40 MG capsule Take 40 mg by mouth daily at 12 noon.    . fexofenadine (ALLEGRA) 180 MG tablet Take 180 mg by mouth daily as needed. For allergies    . fluticasone (FLONASE) 50 MCG/ACT nasal spray Place 2 sprays into both nostrils daily.    . furosemide (LASIX) 40 MG tablet Take 1 tablet by mouth daily.    Marland Kitchen KLOR-CON M20 20 MEQ tablet TAKE ONE TABLET BY MOUTH ONCE DAILY 90 tablet 2  . latanoprost (XALATAN) 0.005 % ophthalmic solution Place 1 drop into both eyes at bedtime.     Marland Kitchen LORazepam (ATIVAN) 1 MG tablet TAKE ONE TO TWO TABLETS BY MOUTH AT BEDTIME AS NEEDED 180 tablet 0  . losartan (COZAAR) 100 MG tablet Take 100 mg by mouth every morning.     . Multiple Vitamins-Minerals (CENTRUM SILVER PO) Take 1 tablet by mouth.    . pravastatin (PRAVACHOL) 40 MG tablet Take 1 tablet by mouth daily.    . vitamin A 8000 UNIT capsule Take 8,000 Units by mouth daily.    Marland Kitchen zolpidem (AMBIEN) 10 MG tablet Take 10 mg by mouth at bedtime.      No current facility-administered medications on file prior to visit.   Allergies  Allergen Reactions  . Codeine Other (See Comments)    REACTION: groggy  . Tape Other (See Comments)    Skin tears, use paper tape

## 2015-10-24 NOTE — Telephone Encounter (Signed)
Ok to refill 

## 2015-10-24 NOTE — Telephone Encounter (Signed)
Rx printed and faxed to [facility]

## 2015-10-25 ENCOUNTER — Ambulatory Visit: Payer: Medicare Other | Admitting: Internal Medicine

## 2015-11-08 ENCOUNTER — Ambulatory Visit (INDEPENDENT_AMBULATORY_CARE_PROVIDER_SITE_OTHER): Payer: Medicare Other | Admitting: Urology

## 2015-11-08 DIAGNOSIS — N5201 Erectile dysfunction due to arterial insufficiency: Secondary | ICD-10-CM

## 2015-11-08 DIAGNOSIS — N3281 Overactive bladder: Secondary | ICD-10-CM | POA: Diagnosis not present

## 2015-11-08 DIAGNOSIS — R972 Elevated prostate specific antigen [PSA]: Secondary | ICD-10-CM | POA: Diagnosis not present

## 2015-11-08 DIAGNOSIS — N401 Enlarged prostate with lower urinary tract symptoms: Secondary | ICD-10-CM

## 2015-11-11 ENCOUNTER — Other Ambulatory Visit: Payer: Self-pay | Admitting: Urology

## 2015-11-11 DIAGNOSIS — R972 Elevated prostate specific antigen [PSA]: Secondary | ICD-10-CM

## 2015-11-25 ENCOUNTER — Other Ambulatory Visit: Payer: Self-pay | Admitting: Urology

## 2015-11-25 DIAGNOSIS — R972 Elevated prostate specific antigen [PSA]: Secondary | ICD-10-CM

## 2015-11-29 ENCOUNTER — Encounter (HOSPITAL_COMMUNITY): Payer: Self-pay

## 2015-11-29 ENCOUNTER — Ambulatory Visit (HOSPITAL_COMMUNITY)
Admission: RE | Admit: 2015-11-29 | Discharge: 2015-11-29 | Disposition: A | Discharge status: Home / Self Care | Payer: Medicare Other | Source: Ambulatory Visit | Attending: Urology | Admitting: Urology

## 2015-11-29 DIAGNOSIS — R972 Elevated prostate specific antigen [PSA]: Secondary | ICD-10-CM | POA: Insufficient documentation

## 2015-11-29 MED ORDER — GENTAMICIN SULFATE 40 MG/ML IJ SOLN
INTRAMUSCULAR | Status: AC
  Filled 2015-11-29: qty 4

## 2015-11-29 MED ORDER — LIDOCAINE HCL (PF) 2 % IJ SOLN: 10.0000 mL | Freq: Once | INTRAMUSCULAR | Status: DC

## 2015-11-29 MED ORDER — GENTAMICIN SULFATE 40 MG/ML IJ SOLN: 160.0000 mg | Freq: Once | INTRAMUSCULAR | Status: DC

## 2015-11-29 MED ORDER — LIDOCAINE HCL (PF) 2 % IJ SOLN
INTRAMUSCULAR | Status: AC
  Filled 2015-11-29: qty 10

## 2015-11-29 NOTE — Discharge Instructions (Signed)
Transrectal Ultrasound-Guided Biopsy °A transrectal ultrasound-guided biopsy is a procedure to take samples of tissue from your prostate. Ultrasound images are used to guide the procedure. It is usually done to check the prostate gland for cancer. °BEFORE THE PROCEDURE °· Do not eat or drink after midnight on the night before your procedure. °· Take medicines as your doctor tells you. °· Your doctor may have you stop taking some medicines 5-7 days before the procedure. °· You will be given an enema before your procedure. During an enema, a liquid is put into your butt (rectum) to clear out waste. °· You may have lab tests the day of your procedure. °· Make plans to have someone drive you home. °PROCEDURE °· You will be given medicine to help you relax before the procedure. An IV tube will be put into one of your veins. It will be used to give fluids and medicine. °· You will be given medicine to reduce the risk of infection (antibiotic). °· You will be placed on your side. °· A probe with gel will be put in your butt. This is used to take pictures of your prostate and the area around it. °· A medicine to numb the area is put into your prostate. °· A biopsy needle is then inserted and guided to your prostate. °· Samples of prostate tissue are taken. The needle is removed. °· The samples are sent to a lab to be checked. Results are usually back in 2-3 days. °AFTER THE PROCEDURE °· You will be taken to a room where you will be watched until you are doing okay. °· You may have some pain in the area around your butt. You will be given medicines for this. °· You may be able to go home the same day. Sometimes, an overnight stay in the hospital is needed. °  °This information is not intended to replace advice given to you by your health care provider. Make sure you discuss any questions you have with your health care provider. °  °Document Released: 09/19/2009 Document Revised: 10/06/2013 Document Reviewed:  05/20/2013 °Elsevier Interactive Patient Education ©2016 [employer] ° °

## 2015-12-13 ENCOUNTER — Ambulatory Visit (HOSPITAL_COMMUNITY)
Admission: RE | Admit: 2015-12-13 | Discharge: 2015-12-13 | Disposition: A | Discharge status: Home / Self Care | Payer: Medicare Other | Source: Ambulatory Visit | Attending: Urology | Admitting: Urology

## 2015-12-13 ENCOUNTER — Other Ambulatory Visit (HOSPITAL_COMMUNITY): Payer: Medicare Other

## 2015-12-13 DIAGNOSIS — R972 Elevated prostate specific antigen [PSA]: Secondary | ICD-10-CM | POA: Diagnosis not present

## 2015-12-13 MED ORDER — GENTAMICIN SULFATE 40 MG/ML IJ SOLN
INTRAMUSCULAR | Status: AC
  Administered 2015-12-13: 160 mg via INTRAMUSCULAR
  Filled 2015-12-13: qty 4

## 2015-12-13 MED ORDER — LIDOCAINE HCL (PF) 2 % IJ SOLN
10.0000 mL | Freq: Once | INTRAMUSCULAR | Status: AC
  Administered 2015-12-13: 10 mL

## 2015-12-13 MED ORDER — LIDOCAINE HCL (PF) 2 % IJ SOLN
INTRAMUSCULAR | Status: AC
  Administered 2015-12-13: 10 mL
  Filled 2015-12-13: qty 10

## 2015-12-13 MED ORDER — GENTAMICIN SULFATE 40 MG/ML IJ SOLN
160.0000 mg | Freq: Once | INTRAMUSCULAR | Status: AC
  Administered 2015-12-13: 160 mg via INTRAMUSCULAR

## 2015-12-13 NOTE — Discharge Instructions (Signed)
Transrectal Ultrasound-Guided Biopsy °A transrectal ultrasound-guided biopsy is a procedure to take samples of tissue from your prostate. Ultrasound images are used to guide the procedure. It is usually done to check the prostate gland for cancer. °BEFORE THE PROCEDURE °· Do not eat or drink after midnight on the night before your procedure. °· Take medicines as your doctor tells you. °· Your doctor may have you stop taking some medicines 5-7 days before the procedure. °· You will be given an enema before your procedure. During an enema, a liquid is put into your butt (rectum) to clear out waste. °· You may have lab tests the day of your procedure. °· Make plans to have someone drive you home. °PROCEDURE °· You will be given medicine to help you relax before the procedure. An IV tube will be put into one of your veins. It will be used to give fluids and medicine. °· You will be given medicine to reduce the risk of infection (antibiotic). °· You will be placed on your side. °· A probe with gel will be put in your butt. This is used to take pictures of your prostate and the area around it. °· A medicine to numb the area is put into your prostate. °· A biopsy needle is then inserted and guided to your prostate. °· Samples of prostate tissue are taken. The needle is removed. °· The samples are sent to a lab to be checked. Results are usually back in 2-3 days. °AFTER THE PROCEDURE °· You will be taken to a room where you will be watched until you are doing okay. °· You may have some pain in the area around your butt. You will be given medicines for this. °· You may be able to go home the same day. Sometimes, an overnight stay in the hospital is needed. °  °This information is not intended to replace advice given to you by your health care provider. Make sure you discuss any questions you have with your health care provider. °  °Document Released: 09/19/2009 Document Revised: 10/06/2013 Document Reviewed:  05/20/2013 °Elsevier Interactive Patient Education ©2016 [employer] ° °

## 2015-12-28 DIAGNOSIS — R972 Elevated prostate specific antigen [PSA]: Secondary | ICD-10-CM | POA: Diagnosis not present

## 2015-12-28 DIAGNOSIS — F5221 Male erectile disorder: Secondary | ICD-10-CM | POA: Diagnosis not present

## 2015-12-28 DIAGNOSIS — H6091 Unspecified otitis externa, right ear: Secondary | ICD-10-CM | POA: Diagnosis not present

## 2016-02-06 ENCOUNTER — Encounter: Payer: Self-pay | Admitting: Cardiovascular Disease

## 2016-02-06 ENCOUNTER — Ambulatory Visit (INDEPENDENT_AMBULATORY_CARE_PROVIDER_SITE_OTHER): Payer: Medicare Other | Admitting: Cardiovascular Disease

## 2016-02-06 VITALS — BP 130/81 | HR 77 | Ht 69.0 in | Wt 218.0 lb

## 2016-02-06 DIAGNOSIS — E785 Hyperlipidemia, unspecified: Secondary | ICD-10-CM | POA: Diagnosis not present

## 2016-02-06 DIAGNOSIS — M7989 Other specified soft tissue disorders: Secondary | ICD-10-CM | POA: Diagnosis not present

## 2016-02-06 DIAGNOSIS — I10 Essential (primary) hypertension: Secondary | ICD-10-CM

## 2016-02-06 DIAGNOSIS — I471 Supraventricular tachycardia: Secondary | ICD-10-CM | POA: Diagnosis not present

## 2016-02-06 NOTE — Progress Notes (Signed)
Patient ID: Julian Fowler, male   DOB: 07-Jul-1935, 81 y.o.   MRN: [id number]      SUBJECTIVE: The patient presents for follow-up of SVT. He is a former patient of Dr. Ron Parker and this is my first time meeting him. He has been stable on diltiazem. He also has a history of anxiety, noncardiac chest pain, hypertension, and hyperlipidemia.  He very seldom has palpitations. He swims 2 miles per day 3 days a week. For the past 3 weeks he has been walking more than 6 miles daily. He denies exertional chest discomfort. He occasionally has mild leg swelling for which he uses compression stockings and Lasix.  Review of Systems: As per "subjective", otherwise negative.  Allergies  Allergen Reactions  . Codeine Other (See Comments)    REACTION: groggy  . Tape Other (See Comments)    Skin tears, use paper tape    Current Outpatient Prescriptions  Medication Sig Dispense Refill  . acetaminophen (TYLENOL) 325 MG tablet Take 325 mg by mouth every 6 (six) hours as needed for pain.     Marland Kitchen amLODipine (NORVASC) 2.5 MG tablet Take 1 tablet (2.5 mg total) by mouth every morning. 90 tablet 3  . Calcium Carb-Vit D-C-E-Mineral (OS-CAL ULTRA) 600 MG TABS Take 1 tablet by mouth.    . diltiazem (CARDIZEM CD) 120 MG 24 hr capsule Take 1 capsule (120 mg total) by mouth 2 (two) times daily. 180 capsule 3  . esomeprazole (NEXIUM) 40 MG capsule Take 40 mg by mouth daily at 12 noon.    . fexofenadine (ALLEGRA) 180 MG tablet Take 180 mg by mouth daily as needed. For allergies    . fluticasone (FLONASE) 50 MCG/ACT nasal spray Place 2 sprays into both nostrils daily.    . furosemide (LASIX) 40 MG tablet Take 1 tablet by mouth daily.    Marland Kitchen KLOR-CON M20 20 MEQ tablet TAKE ONE TABLET BY MOUTH ONCE DAILY 90 tablet 2  . latanoprost (XALATAN) 0.005 % ophthalmic solution Place 1 drop into both eyes at bedtime.     Marland Kitchen LORazepam (ATIVAN) 1 MG tablet Take 1-2 tablets (1-2 mg total) by mouth at bedtime as needed. 180 tablet 0  .  losartan (COZAAR) 100 MG tablet Take 100 mg by mouth every morning.     . Multiple Vitamins-Minerals (CENTRUM SILVER PO) Take 1 tablet by mouth.    . pravastatin (PRAVACHOL) 40 MG tablet Take 1 tablet by mouth daily.     No current facility-administered medications for this visit.    Past Medical History  Diagnosis Date  . GERD (gastroesophageal reflux disease)   . Hyperlipidemia     under control  . Hypertension December 29, 2009    renal artery Doppler , normal  12/2009  . Abdominal aorta injury     Normal size, ultrasound, March 17,2011  . Asbestos exposure Jan 2001    Hx of asbestos related pleural plaques  . Overactive bladder   . ED (erectile dysfunction)   . Arthritis   . Hearing loss in left ear   . Allergic rhinitis   . Ejection fraction     60%, echo, 2008, mild RV dysfunction  . Easy bruisability   . BPH (benign prostatic hyperplasia)   . History of shingles     "mild"  . H/O hiatal hernia   . Tinnitus   . Balance problem   . Complication of anesthesia     "trouble waking up"  . Sleep apnea  was dx 10 years ago reports lost a lot of weight and no longer machine    Past Surgical History  Procedure Laterality Date  . Craniectomy for excision of acoustic neuroma  3/95  . Trigger finger release  2003    (thumb) middle finger (2006)  . Cerebral embolization  12/2011    "radiation therapy-did not work"  . Brain surgery    . Total knee arthroplasty Left 07/06/2013    Procedure: LEFT TOTAL KNEE ARTHROPLASTY;  Surgeon: Gearlean Alf, MD;  Location: [facility];  Service: Orthopedics;  Laterality: Left;  . Cholecystectomy  6/98  . Back surgery  2014    herniated L1, L2   Dr Carloyn Manner  . Total knee arthroplasty Right 01/18/2014    Procedure: RIGHT TOTAL KNEE ARTHROPLASTY;  Surgeon: Gearlean Alf, MD;  Location: [facility];  Service: Orthopedics;  Laterality: Right;  . Radiology with anesthesia N/A 07/07/2014    Procedure: EMBOLIZATION;  Surgeon: Rob Hickman, MD;   Location: [facility];  Service: Radiology;  Laterality: N/A;    Social History   Social History  . Marital Status: Married    Spouse Name: N/A  . Number of Children: N/A  . Years of Education: N/A   Occupational History  . Not on file.   Social History Main Topics  . Smoking status: Former Smoker -- 1.50 packs/day for 30 years    Types: Cigarettes    Start date: 04/24/1957    Quit date: 10/15/1986  . Smokeless tobacco: Never Used  . Alcohol Use: No     Comment: Used to drink heavily at times  . Drug Use: No  . Sexual Activity: Not on file   Other Topics Concern  . Not on file   Social History Narrative   Co-dependent relationship with his 2 year old son who has drug and financial problems   Recently married to [place] on 01/02/2014     Filed Vitals:   02/06/16 0858  BP: 130/81  Pulse: 77  Height: 5\' 9"  (1.753 m)  Weight: 218 lb (98.884 kg)    PHYSICAL EXAM General: NAD HEENT: Normal. Neck: No JVD, no thyromegaly. Lungs: Clear to auscultation bilaterally with normal respiratory effort. CV: Nondisplaced PMI.  Regular rate and rhythm, normal S1/S2, no S3/S4, no murmur. No pretibial or periankle edema.  No carotid bruit.   Abdomen: Soft, nontender, no distention.  Neurologic: Alert and oriented.  Psych: Normal affect. Skin: Normal. Musculoskeletal: No gross deformities.  ECG: Most recent ECG reviewed.      ASSESSMENT AND PLAN: 1. SVT: Stable on diltiazem. No changes.  2. Essential HTN: Controlled. No changes.  3. Hyperlipidemia: Continue statin therapy.  4. Leg swelling: Recommended less Lasix use and use of compression stockings.  Dispo: fu 1 year.   Kate Sable, M.D., F.A.C.C.

## 2016-02-06 NOTE — Patient Instructions (Signed)
Continue all current medications. Your physician wants you to follow up in:  1 year.  You will receive a reminder letter in the mail one-two months in advance.  If you don't receive a letter, please call our office to schedule the follow up appointment   

## 2016-02-16 ENCOUNTER — Other Ambulatory Visit: Payer: Self-pay | Admitting: Cardiology

## 2016-03-13 DIAGNOSIS — R600 Localized edema: Secondary | ICD-10-CM | POA: Diagnosis not present

## 2016-03-15 DIAGNOSIS — M7989 Other specified soft tissue disorders: Secondary | ICD-10-CM | POA: Diagnosis not present

## 2016-03-15 DIAGNOSIS — R609 Edema, unspecified: Secondary | ICD-10-CM | POA: Diagnosis not present

## 2016-03-29 DIAGNOSIS — K649 Unspecified hemorrhoids: Secondary | ICD-10-CM | POA: Diagnosis not present

## 2016-03-29 DIAGNOSIS — E782 Mixed hyperlipidemia: Secondary | ICD-10-CM | POA: Diagnosis not present

## 2016-03-29 DIAGNOSIS — K5900 Constipation, unspecified: Secondary | ICD-10-CM | POA: Diagnosis not present

## 2016-03-29 DIAGNOSIS — R600 Localized edema: Secondary | ICD-10-CM | POA: Diagnosis not present

## 2016-03-29 DIAGNOSIS — I10 Essential (primary) hypertension: Secondary | ICD-10-CM | POA: Diagnosis not present

## 2016-03-29 DIAGNOSIS — L03116 Cellulitis of left lower limb: Secondary | ICD-10-CM | POA: Diagnosis not present

## 2016-03-29 DIAGNOSIS — R7301 Impaired fasting glucose: Secondary | ICD-10-CM | POA: Diagnosis not present

## 2016-04-03 DIAGNOSIS — R011 Cardiac murmur, unspecified: Secondary | ICD-10-CM | POA: Diagnosis not present

## 2016-04-03 DIAGNOSIS — M1991 Primary osteoarthritis, unspecified site: Secondary | ICD-10-CM | POA: Diagnosis not present

## 2016-04-03 DIAGNOSIS — I10 Essential (primary) hypertension: Secondary | ICD-10-CM | POA: Diagnosis not present

## 2016-04-03 DIAGNOSIS — I358 Other nonrheumatic aortic valve disorders: Secondary | ICD-10-CM | POA: Diagnosis not present

## 2016-04-03 DIAGNOSIS — I517 Cardiomegaly: Secondary | ICD-10-CM | POA: Diagnosis not present

## 2016-04-03 DIAGNOSIS — R7301 Impaired fasting glucose: Secondary | ICD-10-CM | POA: Diagnosis not present

## 2016-04-03 DIAGNOSIS — R600 Localized edema: Secondary | ICD-10-CM | POA: Diagnosis not present

## 2016-04-03 DIAGNOSIS — E782 Mixed hyperlipidemia: Secondary | ICD-10-CM | POA: Diagnosis not present

## 2016-04-12 DIAGNOSIS — I10 Essential (primary) hypertension: Secondary | ICD-10-CM | POA: Diagnosis not present

## 2016-04-12 DIAGNOSIS — R7301 Impaired fasting glucose: Secondary | ICD-10-CM | POA: Diagnosis not present

## 2016-04-12 DIAGNOSIS — R972 Elevated prostate specific antigen [PSA]: Secondary | ICD-10-CM | POA: Diagnosis not present

## 2016-04-12 DIAGNOSIS — R600 Localized edema: Secondary | ICD-10-CM | POA: Diagnosis not present

## 2016-04-12 DIAGNOSIS — N400 Enlarged prostate without lower urinary tract symptoms: Secondary | ICD-10-CM | POA: Diagnosis not present

## 2016-04-12 DIAGNOSIS — E871 Hypo-osmolality and hyponatremia: Secondary | ICD-10-CM | POA: Diagnosis not present

## 2016-04-12 DIAGNOSIS — E782 Mixed hyperlipidemia: Secondary | ICD-10-CM | POA: Diagnosis not present

## 2016-04-12 DIAGNOSIS — I671 Cerebral aneurysm, nonruptured: Secondary | ICD-10-CM | POA: Diagnosis not present

## 2016-05-03 ENCOUNTER — Encounter: Payer: Self-pay | Admitting: Internal Medicine

## 2016-05-03 ENCOUNTER — Ambulatory Visit (INDEPENDENT_AMBULATORY_CARE_PROVIDER_SITE_OTHER)
Admission: RE | Admit: 2016-05-03 | Discharge: 2016-05-03 | Disposition: A | Discharge status: Home / Self Care | Payer: Medicare Other | Source: Ambulatory Visit | Attending: Internal Medicine | Admitting: Internal Medicine

## 2016-05-03 ENCOUNTER — Ambulatory Visit (INDEPENDENT_AMBULATORY_CARE_PROVIDER_SITE_OTHER): Payer: Medicare Other | Admitting: Internal Medicine

## 2016-05-03 VITALS — BP 136/68 | HR 54 | Ht 69.5 in | Wt 225.4 lb

## 2016-05-03 DIAGNOSIS — J4520 Mild intermittent asthma, uncomplicated: Secondary | ICD-10-CM | POA: Diagnosis not present

## 2016-05-03 DIAGNOSIS — Z77090 Contact with and (suspected) exposure to asbestos: Secondary | ICD-10-CM | POA: Diagnosis not present

## 2016-05-03 DIAGNOSIS — J3089 Other allergic rhinitis: Secondary | ICD-10-CM

## 2016-05-03 DIAGNOSIS — G4733 Obstructive sleep apnea (adult) (pediatric): Secondary | ICD-10-CM | POA: Diagnosis not present

## 2016-05-03 DIAGNOSIS — Z0000 Encounter for general adult medical examination without abnormal findings: Secondary | ICD-10-CM | POA: Diagnosis not present

## 2016-05-03 DIAGNOSIS — J309 Allergic rhinitis, unspecified: Secondary | ICD-10-CM | POA: Diagnosis not present

## 2016-05-03 DIAGNOSIS — J302 Other seasonal allergic rhinitis: Secondary | ICD-10-CM

## 2016-05-03 MED ORDER — LORAZEPAM 1 MG PO TABS: 1.0000 mg | ORAL_TABLET | Freq: Every evening | ORAL | Status: DC | PRN

## 2016-05-03 MED ORDER — FLUTICASONE PROPIONATE 50 MCG/ACT NA SUSP: 2.0000 | Freq: Every day | NASAL | Status: DC

## 2016-05-03 NOTE — Progress Notes (Signed)
10/21/14- 81 year old male former smoker followed for allergic rhinitis, asthma, COPD, hx obstructive sleep apnea, history of asbestos exposure/plaques. Chronic insomnia Much "sinus" this fall blamed on damp weather. Doing much better now. Since last year had right total knee replacement uncomplicated. Still swimming 3 days per week. He stopped using CPAP years ago when he lost significant weight. CXR 09/24/13 IMPRESSION: Stable bilateral chronic changes consistent with patient's history of asbestos exposure. No acute abnormalities identified. Electronically Signed  By: Margaree Mackintosh M.D.  On: 09/24/2013 12:50  05/03/15-  81 year old male former smoker followed for allergic rhinitis, asthma, COPD, hx obstructive sleep apnea, history of asbestos exposure/plaques. Chronic insomnia FOLLOWS FOR: Pt states he continues to swim as usual(about 1 hour out of 2 hours in being in water)-Denies any productive cough or SOB. Has had flare up of allergies. CXR 05/03/15 IMPRESSION: Areas of diaphragmatic calcification and pleural plaque bilaterally appear stable compared to January 2016 study. No edema or consolidation. No mass or adenopathy appreciable. Electronically Signed  By: Lowella Grip III M.D.  On: 05/03/2015 09:35  05/03/2016-81 year old male former smoker followed for allergic rhinitis, asthma, COPD, history OSA, asbestos exposure/plaques, chronic insomnia FOLLOWS FOR: recently fixing leak in home and giving him trouble with breathing. Would like to have Rx for Flonase as well. Here with wife Had water leak and basement with some associated odors related to painting and repair says the smells burned his nose leaving him sore and stuffy. Still swimming regularly without dyspnea on exertion. No significant cough, wheeze, chest pain No change chronic edema right lower leg which has been evaluated  ROS See HPI Constitutional:   No-   weight loss, night sweats, fevers, chills, fatigue,  lassitude. HEENT:   + headaches,  No -difficulty swallowing, tooth/dental problems, sore throat,       No-  sneezing, itching, ear ache, + nasal congestion, post nasal drip,  CV:  No-   chest pain, orthopnea, PND, swelling in lower extremities, no-anasarca, dizziness, palpitations Resp: No-   shortness of breath with exertion or at rest.              No-   productive cough,  No non-productive cough,  No-  coughing up of blood.              No-   change in color of mucus.  No- wheezing.   Skin: No-   rash or lesions. GI:  No-   heartburn, indigestion, abdominal pain, nausea, vomiting,              GU:  MS:  + joint pain or swelling. .  + back pain. Neuro-  nothing unusual except per  HPI Psych:  No- change in mood or affect. No depression or anxiety.  No memory loss.  OBJ General- Alert, Oriented, Affect-appropriate, Distress- none acute Skin- rash-none, lesions- none, excoriation- none,  Lymphadenopathy- none Head- atraumatic            Eyes- Gross vision intact, PERRLA, conjunctivae clear secretions            Ears- +hard of hearing            Nose- Clear, no-Septal dev, mucus, polyps, erosion, perforation             Throat- Mallampati II , mucosa clear , drainage- none, tonsils- atrophic. Dental repair. Neck- flexible , trachea midline, no stridor , thyroid nl, carotid no bruit Chest - symmetrical excursion , unlabored  Heart/CV- slow RRR , no murmur , no gallop  , no rub, nl s1 s2                           - JVD- none , edema + chronic right lower leg, stasis changes- none, varices- none           Lung- clear to P&A, wheeze- none, cough- none , dullness-none, rub- none. I cannot hear fine crackles or rub.           Chest wall-  Abd-  Br/ Gen/ Rectal- Not done, not indicated Extrem- +edema and erythema right calf/ankle,  Neuro- grossly intact to observation. Speech is clear.

## 2016-05-03 NOTE — Patient Instructions (Addendum)
Order CXR    Dx asbestos exposure  Script sent for [employer] printed for lorazepam

## 2016-05-06 NOTE — Assessment & Plan Note (Signed)
Wife does not indicate witnessed apnea or snoring any longer since he lost weight and swims regularly now

## 2016-05-06 NOTE — Assessment & Plan Note (Signed)
Continues very well controlled with rare wheeze and no dyspnea on exertion. Not needing rescue inhaler routinely and no sleep disturbance.

## 2016-05-06 NOTE — Assessment & Plan Note (Signed)
Exacerbation now by irritant smells associated with renovation work in his basement. Plan-adequate ventilation, appropriate use of Flonase and saline nasal rinse

## 2016-05-06 NOTE — Assessment & Plan Note (Signed)
Plaques have remained stable with no symptom or image concern Plan-continue annual chest x-ray

## 2016-05-08 ENCOUNTER — Other Ambulatory Visit: Payer: Self-pay | Admitting: Vascular Surgery

## 2016-05-08 DIAGNOSIS — R600 Localized edema: Secondary | ICD-10-CM

## 2016-05-15 DIAGNOSIS — H524 Presbyopia: Secondary | ICD-10-CM | POA: Diagnosis not present

## 2016-05-15 DIAGNOSIS — H11153 Pinguecula, bilateral: Secondary | ICD-10-CM | POA: Diagnosis not present

## 2016-05-15 DIAGNOSIS — H401111 Primary open-angle glaucoma, right eye, mild stage: Secondary | ICD-10-CM | POA: Diagnosis not present

## 2016-05-29 ENCOUNTER — Encounter (HOSPITAL_COMMUNITY): Payer: Medicare Other

## 2016-05-29 ENCOUNTER — Encounter: Payer: Medicare Other | Admitting: Vascular Surgery

## 2016-06-04 ENCOUNTER — Encounter: Payer: Self-pay | Admitting: Vascular Surgery

## 2016-06-07 ENCOUNTER — Encounter (HOSPITAL_COMMUNITY): Payer: Medicare Other

## 2016-06-07 ENCOUNTER — Encounter: Payer: Medicare Other | Admitting: Vascular Surgery

## 2016-06-08 ENCOUNTER — Other Ambulatory Visit: Payer: Self-pay

## 2016-06-12 DIAGNOSIS — R972 Elevated prostate specific antigen [PSA]: Secondary | ICD-10-CM | POA: Diagnosis not present

## 2016-06-13 ENCOUNTER — Other Ambulatory Visit: Payer: Self-pay | Admitting: Cardiology

## 2016-06-16 DIAGNOSIS — Z23 Encounter for immunization: Secondary | ICD-10-CM | POA: Diagnosis not present

## 2016-06-16 DIAGNOSIS — Z6833 Body mass index (BMI) 33.0-33.9, adult: Secondary | ICD-10-CM | POA: Diagnosis not present

## 2016-06-16 DIAGNOSIS — S40811A Abrasion of right upper arm, initial encounter: Secondary | ICD-10-CM | POA: Diagnosis not present

## 2016-06-19 ENCOUNTER — Ambulatory Visit (INDEPENDENT_AMBULATORY_CARE_PROVIDER_SITE_OTHER): Payer: Medicare Other | Admitting: Urology

## 2016-06-19 DIAGNOSIS — N401 Enlarged prostate with lower urinary tract symptoms: Secondary | ICD-10-CM | POA: Diagnosis not present

## 2016-06-19 DIAGNOSIS — R972 Elevated prostate specific antigen [PSA]: Secondary | ICD-10-CM | POA: Diagnosis not present

## 2016-06-23 ENCOUNTER — Other Ambulatory Visit: Payer: Self-pay | Admitting: Cardiovascular Disease

## 2016-07-06 DIAGNOSIS — Z6833 Body mass index (BMI) 33.0-33.9, adult: Secondary | ICD-10-CM | POA: Diagnosis not present

## 2016-07-06 DIAGNOSIS — R600 Localized edema: Secondary | ICD-10-CM | POA: Diagnosis not present

## 2016-07-06 DIAGNOSIS — E782 Mixed hyperlipidemia: Secondary | ICD-10-CM | POA: Diagnosis not present

## 2016-07-06 DIAGNOSIS — I10 Essential (primary) hypertension: Secondary | ICD-10-CM | POA: Diagnosis not present

## 2016-07-06 DIAGNOSIS — K5900 Constipation, unspecified: Secondary | ICD-10-CM | POA: Diagnosis not present

## 2016-07-12 DIAGNOSIS — H903 Sensorineural hearing loss, bilateral: Secondary | ICD-10-CM | POA: Diagnosis not present

## 2016-07-12 DIAGNOSIS — H9313 Tinnitus, bilateral: Secondary | ICD-10-CM | POA: Diagnosis not present

## 2016-07-12 DIAGNOSIS — H60503 Unspecified acute noninfective otitis externa, bilateral: Secondary | ICD-10-CM | POA: Diagnosis not present

## 2016-07-12 DIAGNOSIS — S56221A Laceration of other flexor muscle, fascia and tendon at forearm level, right arm, initial encounter: Secondary | ICD-10-CM | POA: Diagnosis not present

## 2016-07-12 DIAGNOSIS — T81719A Complication of unspecified artery following a procedure, not elsewhere classified, initial encounter: Secondary | ICD-10-CM | POA: Diagnosis not present

## 2016-07-12 DIAGNOSIS — H6123 Impacted cerumen, bilateral: Secondary | ICD-10-CM | POA: Diagnosis not present

## 2016-07-17 ENCOUNTER — Encounter: Payer: Self-pay | Admitting: Vascular Surgery

## 2016-07-19 ENCOUNTER — Encounter: Payer: Self-pay | Admitting: Vascular Surgery

## 2016-07-19 ENCOUNTER — Ambulatory Visit (INDEPENDENT_AMBULATORY_CARE_PROVIDER_SITE_OTHER): Payer: Medicare Other | Admitting: Vascular Surgery

## 2016-07-19 ENCOUNTER — Ambulatory Visit (HOSPITAL_COMMUNITY)
Admission: RE | Admit: 2016-07-19 | Discharge: 2016-07-19 | Disposition: A | Discharge status: Home / Self Care | Payer: Medicare Other | Source: Ambulatory Visit | Attending: Vascular Surgery | Admitting: Vascular Surgery

## 2016-07-19 VITALS — BP 136/84 | HR 50 | Temp 97.4°F | Resp 16 | Ht 69.5 in | Wt 218.0 lb

## 2016-07-19 DIAGNOSIS — M7989 Other specified soft tissue disorders: Secondary | ICD-10-CM | POA: Diagnosis not present

## 2016-07-19 DIAGNOSIS — R600 Localized edema: Secondary | ICD-10-CM | POA: Diagnosis not present

## 2016-07-19 NOTE — Progress Notes (Signed)
Referring Physician: Dr Pleas Koch  Patient name: Julian Fowler MRN: [id number] DOB: Oct 11, 1935 Sex: male  REASON FOR CONSULT: right leg swelling  HPI: KELLAM KROCKER is a 81 y.o. male with a several month history of intermittent right leg swelling. He states the swelling did improve somewhat after his Norvasc was discontinued. He denies prior history of DVT. He has had bilateral knee replacement. One was done in 2014 the other in 2015. He does have family history of varicose veins in his mother and sisters. He states he did get some benefit from wearing some compression stockings. Other medical problems include hyperlipidemia hypertension sleep apnea all of which are currently stable. He is a former smoker.  Past Medical History:  Diagnosis Date  . Abdominal aorta injury    Normal size, ultrasound, March 17,2011  . Allergic rhinitis   . Arthritis   . Asbestos exposure Jan 2001   Hx of asbestos related pleural plaques  . Balance problem   . BPH (benign prostatic hyperplasia)   . Complication of anesthesia    "trouble waking up"  . Easy bruisability   . ED (erectile dysfunction)   . Ejection fraction    60%, echo, 2008, mild RV dysfunction  . GERD (gastroesophageal reflux disease)   . H/O hiatal hernia   . Hearing loss in left ear   . History of shingles    "mild"  . Hyperlipidemia    under control  . Hypertension December 29, 2009   renal artery Doppler , normal  12/2009  . Overactive bladder   . Sleep apnea    was dx 10 years ago reports lost a lot of weight and no longer machine  . Tinnitus    Past Surgical History:  Procedure Laterality Date  . BACK SURGERY  2014   herniated L1, L2   Dr Carloyn Manner  . BRAIN SURGERY    . CEREBRAL EMBOLIZATION  12/2011   "radiation therapy-did not work"  . CHOLECYSTECTOMY  6/98  . CRANIECTOMY FOR EXCISION OF ACOUSTIC NEUROMA  3/95  . RADIOLOGY WITH ANESTHESIA N/A 07/07/2014   Procedure: EMBOLIZATION;  Surgeon: Rob Hickman, MD;   Location: [facility];  Service: Radiology;  Laterality: N/A;  . TOTAL KNEE ARTHROPLASTY Left 07/06/2013   Procedure: LEFT TOTAL KNEE ARTHROPLASTY;  Surgeon: Gearlean Alf, MD;  Location: [facility];  Service: Orthopedics;  Laterality: Left;  . TOTAL KNEE ARTHROPLASTY Right 01/18/2014   Procedure: RIGHT TOTAL KNEE ARTHROPLASTY;  Surgeon: Gearlean Alf, MD;  Location: [facility];  Service: Orthopedics;  Laterality: Right;  . TRIGGER FINGER RELEASE  2003   (thumb) middle finger (2006)    Family History  Problem Relation Age of Onset  . Cancer Father     oral cancer  . Breast cancer Mother   . Heart attack Mother   . Hypertension Sister     Bypass x4  . Alcohol abuse Sister   . Alzheimer's disease Sister   . Kidney disease Sister     SOCIAL HISTORY: Social History   Social History  . Marital status: Married    Spouse name: N/A  . Number of children: N/A  . Years of education: N/A   Occupational History  . Not on file.   Social History Main Topics  . Smoking status: Former Smoker    Packs/day: 1.50    Years: 30.00    Types: Cigarettes    Start date: 04/24/1957    Quit date: 10/15/1986  . Smokeless  tobacco: Never Used  . Alcohol use No     Comment: Used to drink heavily at times  . Drug use: No  . Sexual activity: Not on file   Other Topics Concern  . Not on file   Social History Narrative   Co-dependent relationship with his 52 year old son who has drug and financial problems   Recently married to [place] on 01/02/2014    Allergies  Allergen Reactions  . Codeine Other (See Comments)    REACTION: groggy  . Tape Other (See Comments)    Skin tears, use paper tape    Current Outpatient Prescriptions  Medication Sig Dispense Refill  . acetaminophen (TYLENOL) 325 MG tablet Take 325 mg by mouth every 6 (six) hours as needed for pain.     . Calcium Carb-Vit D-C-E-Mineral (OS-CAL ULTRA) 600 MG TABS Take 1 tablet by mouth.    . CARTIA XT 120 MG 24 hr capsule TAKE ONE CAPSULE BY MOUTH  TWICE DAILY 180 capsule 3  . esomeprazole (NEXIUM) 40 MG capsule Take 40 mg by mouth daily at 12 noon.    . fexofenadine (ALLEGRA) 180 MG tablet Take 180 mg by mouth daily as needed. For allergies    . fluticasone (FLONASE) 50 MCG/ACT nasal spray Place 2 sprays into both nostrils daily. 18.2 g 12  . hydrochlorothiazide (HYDRODIURIL) 25 MG tablet Take 25 mg by mouth daily.    Marland Kitchen KLOR-CON M20 20 MEQ tablet TAKE ONE TABLET BY MOUTH ONCE DAILY 90 tablet 2  . latanoprost (XALATAN) 0.005 % ophthalmic solution Place 1 drop into both eyes at bedtime.     Marland Kitchen LORazepam (ATIVAN) 1 MG tablet Take 1-2 tablets (1-2 mg total) by mouth at bedtime as needed. 180 tablet 3  . losartan (COZAAR) 100 MG tablet Take 100 mg by mouth every morning.     . Multiple Vitamins-Minerals (CENTRUM SILVER PO) Take 1 tablet by mouth.    . pravastatin (PRAVACHOL) 40 MG tablet Take 1 tablet by mouth daily.    . Sildenafil Citrate (VIAGRA PO) Take 120 mg by mouth as needed.  2  . zolpidem (AMBIEN) 10 MG tablet Take 1 tablet by mouth at bedtime.     No current facility-administered medications for this visit.     ROS:   General:  No weight loss, Fever, chills  HEENT: No recent headaches, no nasal bleeding, no visual changes, no sore throat  Neurologic: No dizziness, blackouts, seizures. No recent symptoms of stroke or mini- stroke. No recent episodes of slurred speech, or temporary blindness.  Cardiac: No recent episodes of chest pain/pressure, no shortness of breath at rest.  No shortness of breath with exertion.  Denies history of atrial fibrillation or irregular heartbeat  Vascular: No history of rest pain in feet.  No history of claudication.  No history of non-healing ulcer, No history of DVT   Pulmonary: No home oxygen, no productive cough, no hemoptysis,  No asthma or wheezing  Musculoskeletal:  [X]  Arthritis, [ ]  Low back pain,  [X]  Joint pain  Hematologic:No history of hypercoagulable state.  No history of easy  bleeding.  No history of anemia  Gastrointestinal: No hematochezia or melena,  No gastroesophageal reflux, no trouble swallowing  Urinary: [ ]  chronic Kidney disease, [ ]  on HD - [ ]  MWF or [ ]  TTHS, [ ]  Burning with urination, [ ]  Frequent urination, [ ]  Difficulty urinating;   Skin: No rashes  Psychological: No history of anxiety,  No history of  depression   Physical Examination  Vitals:   07/19/16 1020  BP: 136/84  Pulse: (!) 50  Resp: 16  Temp: 97.4 F (36.3 C)  TempSrc: Oral  SpO2: 96%  Weight: 218 lb (98.9 kg)  Height: 5' 9.5" (1.765 m)    Body mass index is 31.73 kg/m.  General:  Alert and oriented, no acute distress HEENT: Normal Neck: No bruit or JVD Pulmonary: Clear to auscultation bilaterally Cardiac: Regular Rate and Rhythm without murmur Abdomen: Soft, non-tender, non-distended, no mass, no scars Skin: No rash, no ulceration Extremity Pulses:  2+ radial, brachial, femoral posterior tibial pulses bilaterally Musculoskeletal: No deformity 1 to 2+ ankle edema right greater than left  Neurologic: Upper and lower extremity motor 5/5 and symmetric  DATA:  Patient had a venous reflux exam today. This did show some deep vein reflux in the common femoral vein and saphenofemoral junction. However the the greater saphenous vein otherwise was competent. 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  ASSESSMENT:  Right leg swelling probably multifactorial. He does have some mild deep vein reflux. 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   PLAN:  Patient does not have superficial venous reflux that would be amenable to laser ablation. He does get some benefit from compression stockings and I told him I would advise him to continue to wear these long term to prevent breakdown of his skin. He will follow-up with [place] on an as-needed basis.   Ruta Hinds, MD [facility] Office:  [phone] Pager: [phone]

## 2016-08-03 DIAGNOSIS — H9319 Tinnitus, unspecified ear: Secondary | ICD-10-CM | POA: Diagnosis not present

## 2016-08-03 DIAGNOSIS — H6691 Otitis media, unspecified, right ear: Secondary | ICD-10-CM | POA: Diagnosis not present

## 2016-08-03 DIAGNOSIS — Z6834 Body mass index (BMI) 34.0-34.9, adult: Secondary | ICD-10-CM | POA: Diagnosis not present

## 2016-08-16 DIAGNOSIS — H401111 Primary open-angle glaucoma, right eye, mild stage: Secondary | ICD-10-CM | POA: Diagnosis not present

## 2016-08-16 DIAGNOSIS — H401121 Primary open-angle glaucoma, left eye, mild stage: Secondary | ICD-10-CM | POA: Diagnosis not present

## 2016-08-16 DIAGNOSIS — H11153 Pinguecula, bilateral: Secondary | ICD-10-CM | POA: Diagnosis not present

## 2016-08-16 DIAGNOSIS — H16421 Pannus (corneal), right eye: Secondary | ICD-10-CM | POA: Diagnosis not present

## 2016-08-16 DIAGNOSIS — H2513 Age-related nuclear cataract, bilateral: Secondary | ICD-10-CM | POA: Diagnosis not present

## 2016-08-16 DIAGNOSIS — H11823 Conjunctivochalasis, bilateral: Secondary | ICD-10-CM | POA: Diagnosis not present

## 2016-08-16 DIAGNOSIS — H25013 Cortical age-related cataract, bilateral: Secondary | ICD-10-CM | POA: Diagnosis not present

## 2016-08-16 DIAGNOSIS — H43813 Vitreous degeneration, bilateral: Secondary | ICD-10-CM | POA: Diagnosis not present

## 2016-11-06 ENCOUNTER — Ambulatory Visit: Payer: Medicare Other | Admitting: Internal Medicine

## 2016-12-11 ENCOUNTER — Ambulatory Visit: Payer: Medicare Other | Admitting: Gastroenterology

## 2016-12-13 ENCOUNTER — Encounter: Payer: Self-pay | Admitting: Internal Medicine

## 2016-12-13 ENCOUNTER — Ambulatory Visit (INDEPENDENT_AMBULATORY_CARE_PROVIDER_SITE_OTHER): Payer: Medicare Other | Admitting: Internal Medicine

## 2016-12-13 ENCOUNTER — Ambulatory Visit (INDEPENDENT_AMBULATORY_CARE_PROVIDER_SITE_OTHER)
Admission: RE | Admit: 2016-12-13 | Discharge: 2016-12-13 | Disposition: A | Discharge status: Home / Self Care | Payer: Medicare Other | Source: Ambulatory Visit | Attending: Internal Medicine | Admitting: Internal Medicine

## 2016-12-13 ENCOUNTER — Telehealth: Payer: Self-pay | Admitting: Internal Medicine

## 2016-12-13 VITALS — BP 132/68 | HR 52 | Ht 69.0 in | Wt 227.2 lb

## 2016-12-13 DIAGNOSIS — Z77090 Contact with and (suspected) exposure to asbestos: Secondary | ICD-10-CM | POA: Diagnosis not present

## 2016-12-13 DIAGNOSIS — G4733 Obstructive sleep apnea (adult) (pediatric): Secondary | ICD-10-CM | POA: Diagnosis not present

## 2016-12-13 DIAGNOSIS — I712 Thoracic aortic aneurysm, without rupture: Secondary | ICD-10-CM | POA: Diagnosis not present

## 2016-12-13 DIAGNOSIS — J4520 Mild intermittent asthma, uncomplicated: Secondary | ICD-10-CM

## 2016-12-13 DIAGNOSIS — F5104 Psychophysiologic insomnia: Secondary | ICD-10-CM | POA: Diagnosis not present

## 2016-12-13 MED ORDER — SUVOREXANT 20 MG PO TABS: 1.0000 | ORAL_TABLET | Freq: Every day | ORAL | 5 refills | Status: DC

## 2016-12-13 NOTE — Telephone Encounter (Signed)
Spoke with Junie Panning at [facility], aware of recs to fill belsomra.  Nothing further needed.

## 2016-12-13 NOTE — Telephone Encounter (Signed)
We are trying Belsomra as a trial and may not stick with it. Ok to go ahead. Patient has been educated.

## 2016-12-13 NOTE — Progress Notes (Signed)
HPI male former smoker followed for allergic rhinitis, asthma, COPD, history OSA, asbestos exposure/plaques, chronic insomnia Office spirometry 05/03/15- WNL- FVC 3.33/ 78%, FEV1 2.49/ 78%, r 0.75, FEF25-75% 1.92/ 69%.. -----------------------------------------------------------------------------------------------  05/03/2016-82 year old male former smoker followed for allergic rhinitis, asthma, COPD, history OSA, asbestos exposure/plaques, chronic insomnia FOLLOWS FOR: recently fixing leak in home and giving him trouble with breathing. Would like to have Rx for Flonase as well. Here with wife Had water leak and basement with some associated odors related to painting and repair says the smells burned his nose leaving him sore and stuffy. Still swimming regularly without dyspnea on exertion. No significant cough, wheeze, chest pain No change chronic edema right lower leg which has been evaluated  12/13/2016-82 year old male former smoker followed for allergic rhinitis, asthma, COPD, history OSA/ weight loss, asbestos exposure/plaques, chronic insomnia FOLLOWS FOR: Pt states he is doing well with his breathing; gets slight congestion with weather changes. Pt continues to swim. He says he is still swimming 1.5 miles, 3 days per week at [facility] Not trying to swim like the pool underwater anymore because I told her not to. No change in his pulmonary status. Denies chest pain, routine cough or phlegm. We discussed CXR tracking for his asbestos exposure. He still complains about insomnia, stating he averages 2 or 3 hours of sleep per day. He is using Ambien plus lorazepam. Benadryl carry over too long in his system. We discussed sleep habits. Wife does not report obvious breathing or movement disorder.TFT is checked by PCP.  CXR 05/03/16 IMPRESSION: 1. Asbestos related changes of calcified pleural plaques and calcified hemidiaphragms. 2. No active lung disease.  ROS See HPI Constitutional:   No-   weight  loss, night sweats, fevers, chills, fatigue, lassitude. HEENT:   + headaches,  No -difficulty swallowing, tooth/dental problems, sore throat,       No-  sneezing, itching, ear ache,  nasal congestion, post nasal drip,  CV:  No-   chest pain, orthopnea, PND, swelling in lower extremities, no-anasarca, dizziness, palpitations Resp: No-   shortness of breath with exertion or at rest.              No-   productive cough,  No non-productive cough,  No-  coughing up of blood.              No-   change in color of mucus.  No- wheezing.   Skin: No-   rash or lesions. GI:  No-   heartburn, indigestion, abdominal pain, nausea, vomiting,              GU:  MS:  + joint pain or swelling. .  + back pain. Neuro-  nothing unusual except per  HPI Psych:  No- change in mood or affect. No depression or anxiety.  No memory loss.  OBJ  General- Alert, Oriented, Affect-appropriate, Distress- none acute, + overweight, +talkative  Skin- rash-none, lesions- none, excoriation- none,  Lymphadenopathy- none Head- atraumatic            Eyes- Gross vision intact, PERRLA, conjunctivae clear secretions            Ears- +hard of hearing            Nose- Clear, no-Septal dev, mucus, polyps, erosion, perforation             Throat- Mallampati II , mucosa clear , drainage- none, tonsils- atrophic. Dental repair. Neck- flexible , trachea midline, no stridor , thyroid nl, carotid no bruit Chest -  symmetrical excursion , unlabored           Heart/CV- slow RRR , no murmur , no gallop  , no rub, nl s1 s2                           - JVD- none , edema + chronic right lower leg, stasis changes- none, varices- none           Lung- clear to P&A, wheeze- none, cough- none , dullness-none, rub- none. +I cannot hear fine crackles or rub.           Chest wall-  Abd-  Br/ Gen/ Rectal- Not done, not indicated Extrem- +edema and erythema right calf/ankle/ sockline Neuro- grossly intact to observation. Speech is clear.

## 2016-12-13 NOTE — Patient Instructions (Addendum)
Script for Belsomra to try for sleep see if it helps sleep  Order- CXR dx asbestos exposure

## 2016-12-13 NOTE — Telephone Encounter (Signed)
Spoke with [place] with [employer], who states pt was prescribed ambien 10mg  90 day supply on 11/17/16 by another provider, and belsomra 20mg  today by CY. I have spoke with pt, who states Julian Fowler is not working, and CY would like for him to add belsomra in addition to.   CY please advise. Thanks.

## 2016-12-13 NOTE — Progress Notes (Signed)
Spoke with patient and informed him of results. Pt did not have any questions. Nothing further is needed.

## 2016-12-14 NOTE — Assessment & Plan Note (Signed)
Commendable long-term exercise habit swimming. I don't think he should be  breath-holding with exercise under water.

## 2016-12-14 NOTE — Assessment & Plan Note (Signed)
We talked about goal of trying to transition away from Ambien, especially the combination of Ambien plus lorazepam. We agreed to introduce a trial of Belsomra to see if he could drop 1 or both of his current meds.

## 2016-12-14 NOTE — Assessment & Plan Note (Signed)
We discussed and will continue long-term surveillance.

## 2016-12-14 NOTE — Assessment & Plan Note (Signed)
This problem has been an active, supported by wife's report today, since he lost weight

## 2016-12-17 ENCOUNTER — Ambulatory Visit: Payer: Medicare Other | Admitting: Nurse Practitioner

## 2016-12-18 ENCOUNTER — Encounter: Payer: Self-pay | Admitting: Nurse Practitioner

## 2016-12-18 ENCOUNTER — Ambulatory Visit (INDEPENDENT_AMBULATORY_CARE_PROVIDER_SITE_OTHER): Payer: Medicare Other | Admitting: Nurse Practitioner

## 2016-12-18 DIAGNOSIS — K625 Hemorrhage of anus and rectum: Secondary | ICD-10-CM | POA: Insufficient documentation

## 2016-12-18 DIAGNOSIS — K649 Unspecified hemorrhoids: Secondary | ICD-10-CM | POA: Insufficient documentation

## 2016-12-18 MED ORDER — HYDROCORTISONE 2.5 % RE CREA: 1.0000 "application " | TOPICAL_CREAM | Freq: Two times a day (BID) | RECTAL | 1 refills | Status: DC | PRN

## 2016-12-18 NOTE — Assessment & Plan Note (Signed)
Noted internal hemorrhoids, possible external hemorrhoid. These are becoming more symptomatic as time goes on. He is tried some sort of cream but not a which. At this point I will send an Anusol rectal cream for him to use, continue over-the-counter stool management to keep bowel movements soft. We will request his last colonoscopy report completed in 2010 at [facility]. I will discuss with Dr. Gala Romney, given that he is a young and healthy 82 year old, whether to proceed with colonoscopy versus flexible sigmoidoscopy for hemorrhoid evaluation and/or colorectal cancer evaluation. Direct visualization of hemorrhoids will allow [place] to determine if he is a candidate for hemorrhoid banding. He is not wanting hemorrhoid surgery, but is very interested in banding. Return for follow-up based on recommendations.

## 2016-12-18 NOTE — Patient Instructions (Signed)
1. I have sent in Anusol rectal cream to your pharmacy. You can use this up to twice a day as needed. 2. Continue taking over-the-counter medicines to keep your stools soft to help prevent worsening hemorrhoid symptoms. 3. We will request your previous colonoscopy report from [facility]. 4. To help with irritating hemorrhoids while cleaning yourself, you can attempt to use moist wet wipes which are available at most stores where toilet paper is sold. This tends to be less irritating than toilet paper. 5. I will discuss her situation with Dr. Gala Romney and decide whether to proceed with colonoscopy versus flexible sigmoidoscopy in order to determine if your candidate for hemorrhoid banding.

## 2016-12-18 NOTE — Assessment & Plan Note (Signed)
The patient does have some toilet tissue hematochezia after cleaning himself after bowel movement. He notes that he is "a vigorous cleaner" and otherwise has no rectal bleeding, none on stool or in the toilet water. Likely benign anorectal source given hemorrhoids. He is interested in hemorrhoid banding. Hemorrhoid management as per above.

## 2016-12-18 NOTE — Progress Notes (Signed)
Primary Care Physician:  [name], MD Primary Gastroenterologist:  Dr. Gala Romney  Chief Complaint  Patient presents with  . Hemorrhoids    HPI:   Julian Fowler is a 82 y.o. male who presents for evaluation of possible hemorrhoids. No information was provided for background. No notes in the system regarding his last colonoscopy. Today he states he's been having a chronic problem with hemorrhoids which seem to be getting worse. Has been taking OTC Mylanta to keep bowel movements soft. Is a "thorough cleaner" and sometimes notes some bleeding after cleaning. Has used creams and suppositories which aren't working as well anymore. Feels there's a piece of "tissue" which is substantial enough to occasionally divert his stool to the side. Last colonoscopy was 2010 at [facility]. States he was told everything was fine and recommended 10 year repeat. Denies abdominal pain, other hematochezia, melena, fever, chills, unintentional weight loss. Denies chest pain, dyspnea, dizziness, lightheadedness, syncope, near syncope. Denies any other upper or lower GI symptoms.  He is wondering if he should have another colonoscopy.  Past Medical History:  Diagnosis Date  . Abdominal aorta injury    Normal size, ultrasound, March 17,2011  . Allergic rhinitis   . Arthritis   . Asbestos exposure Jan 2001   Hx of asbestos related pleural plaques  . Balance problem   . BPH (benign prostatic hyperplasia)   . Complication of anesthesia    "trouble waking up"  . Easy bruisability   . ED (erectile dysfunction)   . Ejection fraction    60%, echo, 2008, mild RV dysfunction  . GERD (gastroesophageal reflux disease)   . H/O hiatal hernia   . Hearing loss in left ear   . History of shingles    "mild"  . Hyperlipidemia    under control  . Hypertension December 29, 2009   renal artery Doppler , normal  12/2009  . Overactive bladder   . Sleep apnea    was dx 10 years ago reports lost a lot of weight  and no longer machine  . Tinnitus     Past Surgical History:  Procedure Laterality Date  . BACK SURGERY  2014   herniated L1, L2   Dr Carloyn Manner  . BRAIN SURGERY    . CEREBRAL EMBOLIZATION  12/2011   "radiation therapy-did not work"  . CHOLECYSTECTOMY  6/98  . CRANIECTOMY FOR EXCISION OF ACOUSTIC NEUROMA  3/95  . RADIOLOGY WITH ANESTHESIA N/A 07/07/2014   Procedure: EMBOLIZATION;  Surgeon: Rob Hickman, MD;  Location: [place];  Service: Radiology;  Laterality: N/A;  . TOTAL KNEE ARTHROPLASTY Left 07/06/2013   Procedure: LEFT TOTAL KNEE ARTHROPLASTY;  Surgeon: Gearlean Alf, MD;  Location: [facility];  Service: Orthopedics;  Laterality: Left;  . TOTAL KNEE ARTHROPLASTY Right 01/18/2014   Procedure: RIGHT TOTAL KNEE ARTHROPLASTY;  Surgeon: Gearlean Alf, MD;  Location: [facility];  Service: Orthopedics;  Laterality: Right;  . TRIGGER FINGER RELEASE  2003   (thumb) middle finger (2006)    Current Outpatient Prescriptions  Medication Sig Dispense Refill  . acetaminophen (TYLENOL) 325 MG tablet Take 325 mg by mouth every 6 (six) hours as needed for pain.     . Calcium Carb-Vit D-C-E-Mineral (OS-CAL ULTRA) 600 MG TABS Take 1 tablet by mouth.    . CARTIA XT 120 MG 24 hr capsule TAKE ONE CAPSULE BY MOUTH TWICE DAILY 180 capsule 3  . esomeprazole (NEXIUM) 40 MG capsule Take 40 mg by mouth daily  at 12 noon.    . fexofenadine (ALLEGRA) 180 MG tablet Take 180 mg by mouth daily as needed. For allergies    . fluticasone (FLONASE) 50 MCG/ACT nasal spray Place 2 sprays into both nostrils daily. 18.2 g 12  . hydrochlorothiazide (HYDRODIURIL) 25 MG tablet Take 25 mg by mouth daily.    Marland Kitchen KLOR-CON M20 20 MEQ tablet TAKE ONE TABLET BY MOUTH ONCE DAILY 90 tablet 2  . latanoprost (XALATAN) 0.005 % ophthalmic solution Place 1 drop into both eyes at bedtime.     Marland Kitchen LORazepam (ATIVAN) 1 MG tablet Take 1-2 tablets (1-2 mg total) by mouth at bedtime as needed. 180 tablet 3  . losartan (COZAAR) 100 MG tablet Take 100 mg  by mouth every morning.     . Multiple Vitamins-Minerals (CENTRUM SILVER PO) Take 1 tablet by mouth.    . pravastatin (PRAVACHOL) 40 MG tablet Take 1 tablet by mouth daily.    . Suvorexant (BELSOMRA) 20 MG TABS Take 1 tablet by mouth at bedtime. 30 tablet 5  . zolpidem (AMBIEN) 10 MG tablet Take 1 tablet by mouth at bedtime.     No current facility-administered medications for this visit.     Allergies as of 12/18/2016 - Review Complete 12/18/2016  Allergen Reaction Noted  . Codeine Other (See Comments)   . Tape Other (See Comments) 04/10/2012    Family History  Problem Relation Age of Onset  . Cancer Father     oral cancer  . Breast cancer Mother   . Heart attack Mother   . Hypertension Sister     Bypass x4  . Alcohol abuse Sister   . Alzheimer's disease Sister   . Kidney disease Sister   . Colon cancer Neg Hx     Social History   Social History  . Marital status: Married    Spouse name: N/A  . Number of children: N/A  . Years of education: N/A   Occupational History  . Not on file.   Social History Main Topics  . Smoking status: Former Smoker    Packs/day: 1.50    Years: 30.00    Types: Cigarettes    Start date: 04/24/1957    Quit date: 10/15/1986  . Smokeless tobacco: Never Used  . Alcohol use No     Comment: Used to drink heavily at times  . Drug use: No  . Sexual activity: Not on file   Other Topics Concern  . Not on file   Social History Narrative   Co-dependent relationship with his 48 year old son who has drug and financial problems   Recently married to [place] on 01/02/2014    Review of Systems: General: Negative for anorexia, weight loss, fever, chills, fatigue, weakness. ENT: Negative for hoarseness, difficulty swallowing. CV: Negative for chest pain, angina, palpitations, peripheral edema.  Respiratory: Negative for dyspnea at rest, cough, sputum, wheezing.  GI: See history of present illness. MS: Has had both knees operated on, history of knee  pain.  Derm: Negative for rash or itching.  Endo: Negative for unusual weight change.  Heme: Negative for bruising or bleeding. Allergy: Negative for rash or hives.    Physical Exam: BP (!) 158/84   Pulse (!) 54   Temp 97.6 F (36.4 C) (Oral)   Ht 5\' 9"  (1.753 m)   Wt 225 lb 12.8 oz (102.4 kg)   BMI 33.34 kg/m  General:   Alert and oriented. Pleasant and cooperative. Well-nourished and well-developed.  Head:  Normocephalic and atraumatic. Eyes:  Without icterus, sclera clear and conjunctiva pink.  Ears:  Hard of hearing: deaf in left ear, right ear with hearing aid. Cardiovascular:  S1, S2 present without murmurs appreciated. Extremities without clubbing or edema. Respiratory:  Clear to auscultation bilaterally. No wheezes, rales, or rhonchi. No distress.  Gastrointestinal:  +BS, rounded but soft, non-tender and non-distended. No HSM noted. No guarding or rebound. No masses appreciated.  Rectal:  Single hemorrhoid tab right at the anal verge difficult to discern if protruding internal hemorrhoid versus external hemorrhoid. Appreciated likely 2-3 small internal hemorrhoids on DRE.  Musculoskalatal:  Symmetrical without gross deformities. Neurologic:  Alert and oriented x4;  grossly normal neurologically. Psych:  Alert and cooperative. Normal mood and affect. Heme/Lymph/Immune: No excessive bruising noted.    12/18/2016 9:10 AM   Disclaimer: This note was dictated with voice recognition software. Similar sounding words can inadvertently be transcribed and may not be corrected upon review.

## 2016-12-18 NOTE — Progress Notes (Signed)
CC'D TO PCP °

## 2017-01-02 ENCOUNTER — Telehealth: Payer: Self-pay

## 2017-01-02 NOTE — Telephone Encounter (Signed)
-----   Message from [name], NP sent at 01/02/2017  1:14 PM EDT ----- [name] notified the patient that Dr. Gala Romney agrees to another colonoscopy. He will likely need OV update H&P as he likely can't be scheduled for a little bit (his sister is on comfort care right now). Please contact him to touch base about any scheduling needed.  ----- Message ----- From: [name], MD Sent: 12/31/2016   1:56 PM To: [name], NP  I do not know if we have discussed this gentleman or not. I would go along with one more colonoscopy and we can go from there. ----- Message ----- From: [name], NP Sent: 12/26/2016  10:03 AM To: [name], MD  Good Morning, I thought we had chatted about this patient but I can't find a message. 83 y.o. Last TCS at [facility] in 2010 per patient everything normal and recommend 10 year repeat if needed. He's wanting TCS, purpose is for possible banding candidacy and debating TCS vs. Flex Sig.   My thoughts are he's a "young 75" and can do full TCS to accomplish likely last routine look and band if possible (rather than flex sig). Thx! [name]

## 2017-01-02 NOTE — Telephone Encounter (Signed)
Tried to call pt. Julian Fowler and asked pt to call office at his convenience to schedule office visit to update info prior to colonoscopy being scheduled.

## 2017-01-24 ENCOUNTER — Other Ambulatory Visit: Payer: Self-pay

## 2017-01-24 ENCOUNTER — Ambulatory Visit (INDEPENDENT_AMBULATORY_CARE_PROVIDER_SITE_OTHER): Payer: Medicare Other | Admitting: Gastroenterology

## 2017-01-24 ENCOUNTER — Encounter: Payer: Self-pay | Admitting: Gastroenterology

## 2017-01-24 DIAGNOSIS — K625 Hemorrhage of anus and rectum: Secondary | ICD-10-CM

## 2017-01-24 MED ORDER — PEG 3350-KCL-NA BICARB-NACL 420 G PO SOLR: 4000.0000 mL | ORAL | 0 refills | Status: DC

## 2017-01-24 NOTE — Progress Notes (Signed)
cc'ed to pcp °

## 2017-01-24 NOTE — Assessment & Plan Note (Addendum)
82 year old male with scant paper hematochezia in setting of straining, with last colonoscopy 2010 at [facility]. No known history of polyps per patient. He is quite active and healthy, and he desires a final colonoscopy. Discussed risks and benefits; likely low-volume hematochezia is benign, but we discussed considering updated diagnostic colonoscopy at this point. He desires to do this and is quite eager to have a colonoscopy. No contraindication to proceeding; he is younger acting than stated age and no concerns with colonoscopy or sedation is noted at time of visit.   Proceed with TCS with Dr. Gala Romney in near future: the risks, benefits, and alternatives have been discussed with the patient in detail. The patient states understanding and desires to proceed.

## 2017-01-24 NOTE — Progress Notes (Addendum)
Referring Provider: [name], MD Primary Care Physician:  [name], MD Primary GI: Dr. Gala Romney   Chief Complaint  Patient presents with  . Colonoscopy    want to schedule tcs, not having problems  . Hemorrhoids    "some bleeding when overwipes"    HPI:   Julian Fowler is a 82 y.o. male presenting today with a history of constipation and hemorrhoids. Last colonoscopy in 2010 at [facility] by Dr. Lindalou Hose was normal, internal hemorrhoids. Unsure if history of remote polyps.  States he will sometimes see blood with wiping, usually when constipated.   He is quite active. Denies abdominal pain, N/V, significant changes in bowel habits. No upper GI symptoms. No melena. He states he would like a final colonoscopy to ensure all is well. He talks about his experience parasailing and desires to go sky-diving at some point.   Past Medical History:  Diagnosis Date  . Abdominal aorta injury    Normal size, ultrasound, March 17,2011  . Allergic rhinitis   . Arthritis   . Asbestos exposure Jan 2001   Hx of asbestos related pleural plaques  . Balance problem   . BPH (benign prostatic hyperplasia)   . Complication of anesthesia    "trouble waking up"  . Easy bruisability   . ED (erectile dysfunction)   . Ejection fraction    60%, echo, 2008, mild RV dysfunction  . GERD (gastroesophageal reflux disease)   . H/O hiatal hernia   . Hearing loss in left ear   . History of shingles    "mild"  . Hyperlipidemia    under control  . Hypertension December 29, 2009   renal artery Doppler , normal  12/2009  . Overactive bladder   . Sleep apnea    was dx 10 years ago reports lost a lot of weight and no longer machine  . Tinnitus     Past Surgical History:  Procedure Laterality Date  . BACK SURGERY  2014   herniated L1, L2   Dr Carloyn Manner  . BRAIN SURGERY    . CEREBRAL EMBOLIZATION  12/2011   "radiation therapy-did not work"  . CHOLECYSTECTOMY  6/98  . COLONOSCOPY  09/2009   Dr.  Lindalou Hose: normal, internal hemorrhoids   . CRANIECTOMY FOR EXCISION OF ACOUSTIC NEUROMA  3/95  . RADIOLOGY WITH ANESTHESIA N/A 07/07/2014   Procedure: EMBOLIZATION;  Surgeon: Rob Hickman, MD;  Location: [place];  Service: Radiology;  Laterality: N/A;  . TOTAL KNEE ARTHROPLASTY Left 07/06/2013   Procedure: LEFT TOTAL KNEE ARTHROPLASTY;  Surgeon: Gearlean Alf, MD;  Location[facility];  Service: Orthopedics;  Laterality: Left;  . TOTAL KNEE ARTHROPLASTY Right 01/18/2014   Procedure: RIGHT TOTAL KNEE ARTHROPLASTY;  Surgeon: Gearlean Alf, MD;  Location: [facility];  Service: Orthopedics;  Laterality: Right;  . TRIGGER FINGER RELEASE  2003   (thumb) middle finger (2006)    Current Outpatient Prescriptions  Medication Sig Dispense Refill  . acetaminophen (TYLENOL) 325 MG tablet Take 325 mg by mouth every 6 (six) hours as needed for pain.     . Calcium Carb-Vit D-C-E-Mineral (OS-CAL ULTRA) 600 MG TABS Take 1 tablet by mouth.    . CARTIA XT 120 MG 24 hr capsule TAKE ONE CAPSULE BY MOUTH TWICE DAILY 180 capsule 3  . esomeprazole (NEXIUM) 40 MG capsule Take 40 mg by mouth daily at 12 noon.    . fexofenadine (ALLEGRA) 180 MG tablet Take 180 mg by mouth daily  as needed. For allergies    . fluticasone (FLONASE) 50 MCG/ACT nasal spray Place 2 sprays into both nostrils daily. (Patient taking differently: Place 2 sprays into both nostrils as needed. ) 18.2 g 12  . hydrochlorothiazide (HYDRODIURIL) 25 MG tablet Take 25 mg by mouth daily.    Marland Kitchen KLOR-CON M20 20 MEQ tablet TAKE ONE TABLET BY MOUTH ONCE DAILY 90 tablet 2  . latanoprost (XALATAN) 0.005 % ophthalmic solution Place 1 drop into both eyes at bedtime.     Marland Kitchen LORazepam (ATIVAN) 1 MG tablet Take 1-2 tablets (1-2 mg total) by mouth at bedtime as needed. 180 tablet 3  . losartan (COZAAR) 100 MG tablet Take 100 mg by mouth every morning.     . Multiple Vitamins-Minerals (CENTRUM SILVER PO) Take 1 tablet by mouth.    . pravastatin (PRAVACHOL) 40 MG tablet  Take 1 tablet by mouth daily.    . Suvorexant (BELSOMRA) 20 MG TABS Take 1 tablet by mouth at bedtime. 30 tablet 5  . zolpidem (AMBIEN) 10 MG tablet Take 1 tablet by mouth at bedtime as needed.     . polyethylene glycol-electrolytes (TRILYTE) 420 g solution Take 4,000 mLs by mouth as directed. 4000 mL 0   No current facility-administered medications for this visit.     Allergies as of 01/24/2017 - Review Complete 01/24/2017  Allergen Reaction Noted  . Codeine Other (See Comments)   . Tape Other (See Comments) 04/10/2012    Family History  Problem Relation Age of Onset  . Cancer Father     oral cancer  . Breast cancer Mother   . Heart attack Mother   . Hypertension Sister     Bypass x4  . Alcohol abuse Sister   . Alzheimer's disease Sister   . Kidney disease Sister   . Colon cancer Neg Hx     Social History   Social History  . Marital status: Married    Spouse name: N/A  . Number of children: N/A  . Years of education: N/A   Social History Main Topics  . Smoking status: Former Smoker    Packs/day: 1.50    Years: 30.00    Types: Cigarettes    Start date: 04/24/1957    Quit date: 10/15/1986  . Smokeless tobacco: Never Used  . Alcohol use No     Comment: Used to drink heavily at times  . Drug use: No  . Sexual activity: Not Asked   Other Topics Concern  . None   Social History Narrative   Co-dependent relationship with his 61 year old son who has drug and financial problems   Recently married to [place] on 01/02/2014    Review of Systems: Gen: Denies fever, chills, anorexia. Denies fatigue, weakness, weight loss.  CV: Denies chest pain, palpitations, syncope, peripheral edema, and claudication. Resp: Denies dyspnea at rest, cough, wheezing, coughing up blood, and pleurisy. GI: see HPI  Derm: Denies rash, itching, dry skin Psych: Denies depression, anxiety, memory loss, confusion. No homicidal or suicidal ideation.  Heme: see HPI   Physical Exam: BP (!) 155/76    Pulse 61   Temp 97.6 F (36.4 C) (Oral)   Ht 5\' 9"  (1.753 m)   Wt 226 lb 6.4 oz (102.7 kg)   BMI 33.43 kg/m  General:   Alert and oriented. No distress noted. Pleasant and cooperative.  Head:  Normocephalic and atraumatic. Eyes:  Conjuctiva clear without scleral icterus. Mouth:  Oral mucosa pink and moist. Good dentition. No  lesions. Heart:  S1, S2 present without murmurs, rubs, or gallops. Regular rate and rhythm. Abdomen:  +BS, soft, non-tender and non-distended. No rebound or guarding. No HSM or masses noted. Msk:  Symmetrical without gross deformities. Normal posture. Extremities:  Without edema. Neurologic:  Alert and  oriented x4;  grossly normal neurologically. Psych:  Alert and cooperative. Normal mood and affect.

## 2017-01-24 NOTE — Patient Instructions (Signed)
We are setting you up for a colonoscopy with Dr. Gala Romney in the future.   We will try to find out about your insurance coverage. You may have to check your benefits as well to see.

## 2017-01-28 ENCOUNTER — Telehealth: Payer: Self-pay

## 2017-01-28 NOTE — Telephone Encounter (Signed)
Pt's wife called to see if we had his TCS approved with his insurance. I told her that I called and they told me that he did not need a PA. I told her that they would need to call and see what he would have to pay for his part.

## 2017-01-31 ENCOUNTER — Ambulatory Visit (INDEPENDENT_AMBULATORY_CARE_PROVIDER_SITE_OTHER): Payer: Medicare Other | Admitting: Cardiovascular Disease

## 2017-01-31 ENCOUNTER — Encounter: Payer: Self-pay | Admitting: Cardiovascular Disease

## 2017-01-31 VITALS — BP 152/74 | HR 55 | Ht 69.0 in | Wt 228.6 lb

## 2017-01-31 DIAGNOSIS — I471 Supraventricular tachycardia: Secondary | ICD-10-CM | POA: Diagnosis not present

## 2017-01-31 DIAGNOSIS — Z713 Dietary counseling and surveillance: Secondary | ICD-10-CM | POA: Diagnosis not present

## 2017-01-31 DIAGNOSIS — I10 Essential (primary) hypertension: Secondary | ICD-10-CM

## 2017-01-31 DIAGNOSIS — E782 Mixed hyperlipidemia: Secondary | ICD-10-CM

## 2017-01-31 MED ORDER — HYDRALAZINE HCL 25 MG PO TABS: 25.0000 mg | ORAL_TABLET | Freq: Three times a day (TID) | ORAL | 11 refills | Status: DC

## 2017-01-31 NOTE — Progress Notes (Signed)
SUBJECTIVE: The patient presents for follow-up of PSVT and hypertension. He also has a history of noncardiac chest pain and hyperlipidemia.  He said his systolic blood pressure routinely runs greater than 150 and up to 160. He is soon due for a colonoscopy.  He has chest pains when he lies down about twice a month but denies exertional chest pain. He also denies palpitations.  He can swim for one hour and 15 minutes straight. He swims 1.5 miles 3 days per week and does 300 deep knee bends.  I asked him about salt consumption, he says "I eat too much salt ". He and his wife eat out twice a day and he eats pizza and other sodium laden foods.  ECG performed in the office today which I ordered and personally interpreted demonstrates sinus bradycardia, 55 bpm, with no ischemic ST segment or T-wave abnormalities, nor any arrhythmias.    Review of Systems: As per "subjective", otherwise negative.  Allergies  Allergen Reactions  . Codeine Other (See Comments)    REACTION: groggy  . Tape Other (See Comments)    Skin tears, use paper tape    Current Outpatient Prescriptions  Medication Sig Dispense Refill  . acetaminophen (TYLENOL) 325 MG tablet Take 325 mg by mouth every 6 (six) hours as needed for pain.     . Calcium Carb-Vit D-C-E-Mineral (OS-CAL ULTRA) 600 MG TABS Take 1 tablet by mouth.    . CARTIA XT 120 MG 24 hr capsule TAKE ONE CAPSULE BY MOUTH TWICE DAILY 180 capsule 3  . esomeprazole (NEXIUM) 40 MG capsule Take 40 mg by mouth daily at 12 noon.    . fexofenadine (ALLEGRA) 180 MG tablet Take 180 mg by mouth daily as needed. For allergies    . fluticasone (FLONASE) 50 MCG/ACT nasal spray Place 2 sprays into both nostrils daily. (Patient taking differently: Place 2 sprays into both nostrils as needed. ) 18.2 g 12  . hydrochlorothiazide (HYDRODIURIL) 25 MG tablet Take 25 mg by mouth daily.    Marland Kitchen KLOR-CON M20 20 MEQ tablet TAKE ONE TABLET BY MOUTH ONCE DAILY 90 tablet 2  .  latanoprost (XALATAN) 0.005 % ophthalmic solution Place 1 drop into both eyes at bedtime.     Marland Kitchen LORazepam (ATIVAN) 1 MG tablet Take 1-2 tablets (1-2 mg total) by mouth at bedtime as needed. 180 tablet 3  . losartan (COZAAR) 100 MG tablet Take 100 mg by mouth every morning.     . Multiple Vitamins-Minerals (CENTRUM SILVER PO) Take 1 tablet by mouth.    . polyethylene glycol-electrolytes (TRILYTE) 420 g solution Take 4,000 mLs by mouth as directed. 4000 mL 0  . pravastatin (PRAVACHOL) 40 MG tablet Take 1 tablet by mouth daily.    . Suvorexant (BELSOMRA) 20 MG TABS Take 1 tablet by mouth at bedtime. 30 tablet 5  . zolpidem (AMBIEN) 10 MG tablet Take 1 tablet by mouth at bedtime as needed.      No current facility-administered medications for this visit.     Past Medical History:  Diagnosis Date  . Abdominal aorta injury    Normal size, ultrasound, March 17,2011  . Allergic rhinitis   . Arthritis   . Asbestos exposure Jan 2001   Hx of asbestos related pleural plaques  . Balance problem   . BPH (benign prostatic hyperplasia)   . Complication of anesthesia    "trouble waking up"  . Easy bruisability   . ED (erectile dysfunction)   .  Ejection fraction    60%, echo, 2008, mild RV dysfunction  . GERD (gastroesophageal reflux disease)   . H/O hiatal hernia   . Hearing loss in left ear   . History of shingles    "mild"  . Hyperlipidemia    under control  . Hypertension December 29, 2009   renal artery Doppler , normal  12/2009  . Overactive bladder   . Sleep apnea    was dx 10 years ago reports lost a lot of weight and no longer machine  . Tinnitus     Past Surgical History:  Procedure Laterality Date  . BACK SURGERY  2014   herniated L1, L2   Dr Carloyn Manner  . BRAIN SURGERY    . CEREBRAL EMBOLIZATION  12/2011   "radiation therapy-did not work"  . CHOLECYSTECTOMY  6/98  . COLONOSCOPY  09/2009   Dr. Lindalou Hose: normal, internal hemorrhoids   . CRANIECTOMY FOR EXCISION OF ACOUSTIC NEUROMA   3/95  . RADIOLOGY WITH ANESTHESIA N/A 07/07/2014   Procedure: EMBOLIZATION;  Surgeon: Rob Hickman, MD;  Location: [facility];  Service: Radiology;  Laterality: N/A;  . TOTAL KNEE ARTHROPLASTY Left 07/06/2013   Procedure: LEFT TOTAL KNEE ARTHROPLASTY;  Surgeon: Gearlean Alf, MD;  Location: [facility];  Service: Orthopedics;  Laterality: Left;  . TOTAL KNEE ARTHROPLASTY Right 01/18/2014   Procedure: RIGHT TOTAL KNEE ARTHROPLASTY;  Surgeon: Gearlean Alf, MD;  Location: [facility];  Service: Orthopedics;  Laterality: Right;  . TRIGGER FINGER RELEASE  2003   (thumb) middle finger (2006)    Social History   Social History  . Marital status: Married    Spouse name: N/A  . Number of children: N/A  . Years of education: N/A   Occupational History  . Not on file.   Social History Main Topics  . Smoking status: Former Smoker    Packs/day: 1.50    Years: 30.00    Types: Cigarettes    Start date: 04/24/1957    Quit date: 10/15/1986  . Smokeless tobacco: Never Used  . Alcohol use No     Comment: Used to drink heavily at times  . Drug use: No  . Sexual activity: Not on file   Other Topics Concern  . Not on file   Social History Narrative   Co-dependent relationship with his 11 year old son who has drug and financial problems   Recently married to [place] on 01/02/2014     Vitals:   01/31/17 0823  BP: (!) 152/74  Pulse: (!) 55  SpO2: 97%  Weight: 228 lb 9.6 oz (103.7 kg)  Height: 5\' 9"  (1.753 m)    Wt Readings from Last 3 Encounters:  01/31/17 228 lb 9.6 oz (103.7 kg)  01/24/17 226 lb 6.4 oz (102.7 kg)  12/18/16 225 lb 12.8 oz (102.4 kg)     PHYSICAL EXAM General: NAD HEENT: Normal. Neck: No JVD, no thyromegaly. Lungs: Clear to auscultation bilaterally with normal respiratory effort. CV: Nondisplaced PMI.  Regular rate and rhythm, normal S1/S2, no S3/S4, no murmur. No pretibial or periankle edema.  No carotid bruit.   Abdomen: Soft, nontender, no distention.  Neurologic:  Alert and oriented.  Psych: Normal affect. Skin: Normal. Musculoskeletal: No gross deformities.    ECG: Most recent ECG reviewed.   Labs: Lab Results  Component Value Date/Time   K 4.3 07/01/2014 10:45 AM   BUN 13 07/01/2014 10:45 AM   CREATININE 0.90 07/01/2014 10:45 AM   ALT 16 07/01/2014  10:45 AM   TSH 1.617 12/16/2007 10:38 PM   HGB 14.2 07/01/2014 10:45 AM     Lipids: Lab Results  Component Value Date/Time   LDLCALC 112 (H) 01/06/2009 10:52 PM   CHOL 182 01/06/2009 10:52 PM   TRIG 128 01/06/2009 10:52 PM   HDL 44 01/06/2009 10:52 PM       ASSESSMENT AND PLAN: 1. Paroxysmal supraventricular tachycardia: Stable on long-acting diltiazem twice daily. No changes.  2. Essential hypertension: Consistently elevated. I had a long discussion with him on reducing sodium consumption. He does not feel very optimistic regarding this because he enjoys salt so much. I will start hydralazine 25 mg three times daily.  3. Hyperlipidemia: Continue statin therapy.  4. Dietary counseling: See #2.    Disposition: Follow up 1 year.  Kate Sable, M.D., F.A.C.C.

## 2017-01-31 NOTE — Patient Instructions (Signed)
Medication Instructions:   Begin Hydralazine 25mg three times per day.  Continue all other medications.    Labwork: none  Testing/Procedures: none  Follow-Up: Your physician wants you to follow up in:  1 year.  You will receive a reminder letter in the mail one-two months in advance.  If you don't receive a letter, please call our office to schedule the follow up appointment   Any Other Special Instructions Will Be Listed Below (If Applicable).  If you need a refill on your cardiac medications before your next appointment, please call your pharmacy.  

## 2017-02-02 ENCOUNTER — Other Ambulatory Visit: Payer: Self-pay | Admitting: Internal Medicine

## 2017-02-04 NOTE — Telephone Encounter (Signed)
Dr. Annamaria Boots, Mr. Kissoon is asking for a refill on his lorazepam 1mg . His last OV with you was on 12/13/16. His last refill was on 05/03/16 for 180 tabs with 3 refills.    Is it ok for him to receive another refill? He wishes to use [facility] in [place] on [address]. Thanks!

## 2017-02-04 NOTE — Telephone Encounter (Signed)
Ok to refill 

## 2017-02-14 DIAGNOSIS — H11153 Pinguecula, bilateral: Secondary | ICD-10-CM | POA: Diagnosis not present

## 2017-02-14 DIAGNOSIS — H401131 Primary open-angle glaucoma, bilateral, mild stage: Secondary | ICD-10-CM | POA: Diagnosis not present

## 2017-02-14 DIAGNOSIS — H11823 Conjunctivochalasis, bilateral: Secondary | ICD-10-CM | POA: Diagnosis not present

## 2017-02-14 DIAGNOSIS — H16421 Pannus (corneal), right eye: Secondary | ICD-10-CM | POA: Diagnosis not present

## 2017-02-19 DIAGNOSIS — Z6834 Body mass index (BMI) 34.0-34.9, adult: Secondary | ICD-10-CM | POA: Diagnosis not present

## 2017-02-19 DIAGNOSIS — M26621 Arthralgia of right temporomandibular joint: Secondary | ICD-10-CM | POA: Diagnosis not present

## 2017-02-21 ENCOUNTER — Other Ambulatory Visit: Payer: Self-pay | Admitting: Cardiovascular Disease

## 2017-02-25 ENCOUNTER — Telehealth: Payer: Self-pay

## 2017-02-25 NOTE — Telephone Encounter (Signed)
Tried to call with no answer  

## 2017-02-25 NOTE — Telephone Encounter (Signed)
Talked with him and he is aware that his time has moved from 7:30 am on 02/28/17 to 12:15 pm. His also aware of the new time to drink his prep.

## 2017-02-28 ENCOUNTER — Ambulatory Visit (HOSPITAL_COMMUNITY)
Admission: RE | Admit: 2017-02-28 | Discharge: 2017-02-28 | Disposition: A | Discharge status: Home / Self Care | Payer: Medicare Other | Source: Ambulatory Visit | Attending: Internal Medicine | Admitting: Internal Medicine

## 2017-02-28 ENCOUNTER — Encounter (HOSPITAL_COMMUNITY): Payer: Self-pay

## 2017-02-28 ENCOUNTER — Encounter (HOSPITAL_COMMUNITY)
Admission: RE | Disposition: A | Discharge status: Home / Self Care | Payer: Self-pay | Source: Ambulatory Visit | Attending: Internal Medicine

## 2017-02-28 DIAGNOSIS — K921 Melena: Secondary | ICD-10-CM | POA: Diagnosis not present

## 2017-02-28 DIAGNOSIS — J309 Allergic rhinitis, unspecified: Secondary | ICD-10-CM | POA: Diagnosis not present

## 2017-02-28 DIAGNOSIS — K5730 Diverticulosis of large intestine without perforation or abscess without bleeding: Secondary | ICD-10-CM | POA: Insufficient documentation

## 2017-02-28 DIAGNOSIS — K625 Hemorrhage of anus and rectum: Secondary | ICD-10-CM

## 2017-02-28 DIAGNOSIS — I10 Essential (primary) hypertension: Secondary | ICD-10-CM | POA: Insufficient documentation

## 2017-02-28 DIAGNOSIS — Z96653 Presence of artificial knee joint, bilateral: Secondary | ICD-10-CM | POA: Diagnosis not present

## 2017-02-28 DIAGNOSIS — E785 Hyperlipidemia, unspecified: Secondary | ICD-10-CM | POA: Diagnosis not present

## 2017-02-28 DIAGNOSIS — K642 Third degree hemorrhoids: Secondary | ICD-10-CM | POA: Insufficient documentation

## 2017-02-28 DIAGNOSIS — Z79899 Other long term (current) drug therapy: Secondary | ICD-10-CM | POA: Insufficient documentation

## 2017-02-28 DIAGNOSIS — K219 Gastro-esophageal reflux disease without esophagitis: Secondary | ICD-10-CM | POA: Insufficient documentation

## 2017-02-28 DIAGNOSIS — Z87891 Personal history of nicotine dependence: Secondary | ICD-10-CM | POA: Insufficient documentation

## 2017-02-28 DIAGNOSIS — Z77090 Contact with and (suspected) exposure to asbestos: Secondary | ICD-10-CM | POA: Diagnosis not present

## 2017-02-28 DIAGNOSIS — G4730 Sleep apnea, unspecified: Secondary | ICD-10-CM | POA: Insufficient documentation

## 2017-02-28 HISTORY — PX: COLONOSCOPY: SHX5424

## 2017-02-28 SURGERY — COLONOSCOPY
Anesthesia: Moderate Sedation

## 2017-02-28 MED ORDER — MIDAZOLAM HCL 5 MG/5ML IJ SOLN
INTRAMUSCULAR | Status: AC
  Filled 2017-02-28: qty 10

## 2017-02-28 MED ORDER — STERILE WATER FOR IRRIGATION IR SOLN
Status: DC | PRN
  Administered 2017-02-28: 12:00:00

## 2017-02-28 MED ORDER — ONDANSETRON HCL 4 MG/2ML IJ SOLN
INTRAMUSCULAR | Status: DC | PRN
  Administered 2017-02-28: 4 mg via INTRAVENOUS

## 2017-02-28 MED ORDER — MEPERIDINE HCL 100 MG/ML IJ SOLN
INTRAMUSCULAR | Status: AC
  Filled 2017-02-28: qty 2

## 2017-02-28 MED ORDER — MEPERIDINE HCL 100 MG/ML IJ SOLN
INTRAMUSCULAR | Status: DC | PRN
  Administered 2017-02-28: 50 mg via INTRAVENOUS
  Administered 2017-02-28: 25 mg via INTRAVENOUS

## 2017-02-28 MED ORDER — SODIUM CHLORIDE 0.9 % IV SOLN
INTRAVENOUS | Status: DC
  Administered 2017-02-28: 11:00:00 via INTRAVENOUS

## 2017-02-28 MED ORDER — ONDANSETRON HCL 4 MG/2ML IJ SOLN
INTRAMUSCULAR | Status: AC
  Filled 2017-02-28: qty 2

## 2017-02-28 MED ORDER — MIDAZOLAM HCL 5 MG/5ML IJ SOLN
INTRAMUSCULAR | Status: DC | PRN
  Administered 2017-02-28 (×2): 1 mg via INTRAVENOUS
  Administered 2017-02-28 (×2): 2 mg via INTRAVENOUS

## 2017-02-28 NOTE — H&P (Signed)
@LOGO @   Primary Care Physician:  [name], MD Primary Gastroenterologist:  Dr. Gala Romney  Pre-Procedure History & Physical: HPI:  Julian Fowler is a 82 y.o. male here for here for further evaluation of rectal bleeding via colonoscopy. Chronically constipated as well. Negative colonoscopy 2010.  Past Medical History:  Diagnosis Date  . Abdominal aorta injury    Normal size, ultrasound, March 17,2011  . Allergic rhinitis   . Arthritis   . Asbestos exposure Jan 2001   Hx of asbestos related pleural plaques  . Balance problem   . BPH (benign prostatic hyperplasia)   . Complication of anesthesia    "trouble waking up"  . Easy bruisability   . ED (erectile dysfunction)   . Ejection fraction    60%, echo, 2008, mild RV dysfunction  . GERD (gastroesophageal reflux disease)   . H/O hiatal hernia   . Hearing loss in left ear   . History of shingles    "mild"  . Hyperlipidemia    under control  . Hypertension December 29, 2009   renal artery Doppler , normal  12/2009  . Overactive bladder   . Sleep apnea    was dx 10 years ago reports lost a lot of weight and no longer machine  . Tinnitus     Past Surgical History:  Procedure Laterality Date  . BACK SURGERY  2014   herniated L1, L2   Dr Carloyn Manner  . BRAIN SURGERY    . CEREBRAL EMBOLIZATION  12/2011   "radiation therapy-did not work"  . CHOLECYSTECTOMY  6/98  . COLONOSCOPY  09/2009   Dr. Lindalou Hose: normal, internal hemorrhoids   . CRANIECTOMY FOR EXCISION OF ACOUSTIC NEUROMA  3/95  . RADIOLOGY WITH ANESTHESIA N/A 07/07/2014   Procedure: EMBOLIZATION;  Surgeon: Rob Hickman, MD;  Location: [facility];  Service: Radiology;  Laterality: N/A;  . TOTAL KNEE ARTHROPLASTY Left 07/06/2013   Procedure: LEFT TOTAL KNEE ARTHROPLASTY;  Surgeon: Gearlean Alf, MD;  Location: [facility];  Service: Orthopedics;  Laterality: Left;  . TOTAL KNEE ARTHROPLASTY Right 01/18/2014   Procedure: RIGHT TOTAL KNEE ARTHROPLASTY;  Surgeon: Gearlean Alf,  MD;  Location: [facility];  Service: Orthopedics;  Laterality: Right;  . TRIGGER FINGER RELEASE  2003   (thumb) middle finger (2006)    Prior to Admission medications   Medication Sig Start Date End Date Taking? Authorizing Provider  acetaminophen (TYLENOL) 325 MG tablet Take 325 mg by mouth every 6 (six) hours as needed for pain.    Yes [provider]  Calcium Carb-Vit D-C-E-Mineral (OS-CAL ULTRA) 600 MG TABS Take 1 tablet by mouth daily.    Yes [provider]  CARTIA XT 120 MG 24 hr capsule TAKE ONE CAPSULE BY MOUTH TWICE DAILY 02/21/17  Yes Herminio Commons, MD  esomeprazole (NEXIUM) 40 MG capsule Take 40 mg by mouth daily at 12 noon.   Yes [provider]  fexofenadine (ALLEGRA) 180 MG tablet Take 180 mg by mouth daily as needed. For allergies   Yes [provider]  fluticasone (FLONASE) 50 MCG/ACT nasal spray Place 2 sprays into both nostrils daily. Patient taking differently: Place 2 sprays into both nostrils daily as needed for allergies.  05/03/16  Yes Young, Tarri Fuller D, MD  hydrALAZINE (APRESOLINE) 25 MG tablet Take 1 tablet (25 mg total) by mouth 3 (three) times daily. 01/31/17 05/01/17 Yes Herminio Commons, MD  hydrochlorothiazide (HYDRODIURIL) 25 MG tablet Take 25 mg by mouth daily.  Yes [provider]  KLOR-CON M20 20 MEQ tablet TAKE ONE TABLET BY MOUTH ONCE DAILY 06/25/16  Yes Herminio Commons, MD  latanoprost (XALATAN) 0.005 % ophthalmic solution Place 1 drop into both eyes at bedtime.  09/01/12  Yes [provider]  LORazepam (ATIVAN) 1 MG tablet TAKE ONE TO TWO TABLETS BY MOUTH AT BEDTIME AS NEEDED 02/05/17  Yes Noralee Space, MD  losartan (COZAAR) 100 MG tablet Take 100 mg by mouth every morning.    Yes [provider]  Multiple Vitamins-Minerals (CENTRUM SILVER PO) Take 1 tablet by mouth daily.    Yes [provider]  pravastatin (PRAVACHOL) 40 MG tablet Take 1 tablet by mouth at bedtime.  10/05/14   Yes [provider]  zolpidem (AMBIEN) 10 MG tablet Take 1 tablet by mouth at bedtime.  01/24/16  Yes [provider]    Allergies as of 01/24/2017 - Review Complete 01/24/2017  Allergen Reaction Noted  . Codeine Other (See Comments)   . Tape Other (See Comments) 04/10/2012    Family History  Problem Relation Age of Onset  . Cancer Father        oral cancer  . Breast cancer Mother   . Heart attack Mother   . Hypertension Sister        Bypass x4  . Alcohol abuse Sister   . Alzheimer's disease Sister   . Kidney disease Sister   . Colon cancer Neg Hx     Social History   Social History  . Marital status: Married    Spouse name: N/A  . Number of children: N/A  . Years of education: N/A   Occupational History  . Not on file.   Social History Main Topics  . Smoking status: Former Smoker    Packs/day: 1.50    Years: 30.00    Types: Cigarettes    Start date: 04/24/1957    Quit date: 10/15/1986  . Smokeless tobacco: Never Used  . Alcohol use No     Comment: Used to drink heavily at times  . Drug use: No  . Sexual activity: Not on file   Other Topics Concern  . Not on file   Social History Narrative   Co-dependent relationship with his 10 year old son who has drug and financial problems   Recently married to [place] on 01/02/2014    Review of Systems: See HPI, otherwise negative ROS  Physical Exam: BP 134/63   Pulse (!) 58   Temp 98.8 F (37.1 C) (Oral)   Resp 10   Ht 5\' 9"  (1.753 m)   Wt 229 lb (103.9 kg)   SpO2 98%   BMI 33.82 kg/m  General:   Alert,  Well-developed, well-nourished, pleasant and cooperative in NAD Neck:  Supple; no masses or thyromegaly. No significant cervical adenopathy. Lungs:  Clear throughout to auscultation.   No wheezes, crackles, or rhonchi. No acute distress. Heart:  Regular rate and rhythm; no murmurs, clicks, rubs,  or gallops. Abdomen: Non-distended, normal bowel sounds.  Soft and nontender without appreciable  mass or hepatosplenomegaly.  Pulses:  Normal pulses noted. Extremities:  Without clubbing or edema.  Impression:  Pleasant 82 year old gentleman with chronic constipation and recent paper hematochezia.  Recommendations:  Diagnostic colonoscopy today.   The risks, benefits, limitations, alternatives and imponderables have been reviewed with the patient. Questions have been answered. All parties are agreeable.        Notice: This dictation was prepared with Dragon dictation along  with smaller phrase technology. Any transcriptional errors that result from this process are unintentional and may not be corrected upon review.

## 2017-02-28 NOTE — Op Note (Signed)
[facility] Patient Name: Julian Fowler Procedure Date: 02/28/2017 11:44 AM MRN: [id number] Date of Birth: 01-11-1935 Attending MD: Norvel Richards , MD CSN: [id number] Age: 82 Admit Type: Outpatient Procedure:                Colonoscopy Indications:              Hematochezia Providers:                Norvel Richards, MD, Jeanann Lewandowsky. Gwenlyn Perking RN, RN,                            Randa Spike, Technician Referring MD:              Medicines:                Midazolam 6 mg IV, Meperidine 75 mg IV, Ondansetron                            4 mg IV Complications:            No immediate complications. Estimated Blood Loss:     Estimated blood loss: none. Procedure:                Pre-Anesthesia Assessment:                           - Prior to the procedure, a History and Physical                            was performed, and patient medications and                            allergies were reviewed. The patient's tolerance of                            previous anesthesia was also reviewed. The risks                            and benefits of the procedure and the sedation                            options and risks were discussed with the patient.                            All questions were answered, and informed consent                            was obtained. Anticoagulants: The patient has taken                            anticoagulant medication. It was decided not to                            withhold this medication prior to procedure. ASA  Grade Assessment: II - A patient with mild systemic                            disease. After reviewing the risks and benefits,                            the patient was deemed in satisfactory condition to                            undergo the procedure.                           After obtaining informed consent, the colonoscope                            was passed under direct vision. Throughout the                         procedure, the patient's blood pressure, pulse, and                            oxygen saturations were monitored continuously. The                            Colonoscope was introduced through the anus and                            advanced to the the cecum, identified by                            appendiceal orifice and ileocecal valve. The                            colonoscopy was performed without difficulty. The                            patient tolerated the procedure well. The entire                            colon was well visualized. The colonoscopy was                            performed without difficulty. The patient tolerated                            the procedure well. The quality of the bowel                            preparation was adequate. The ileocecal valve,                            appendiceal orifice, and rectum were photographed. Scope In: 11:58:05 AM Scope Out: 12:11:59 PM Scope Withdrawal Time: 0 hours 6 minutes 35 seconds  Total Procedure Duration: 0 hours 13 minutes 54  seconds  Findings:      The digital rectal exam findings include internal hemorrhoids that       prolapse with straining, but require manual replacement into the anal       canal (Grade III).      Scattered small and large-mouthed diverticula were found in the sigmoid       colon and descending colon.      The exam was otherwise without abnormality on direct and retroflexion       views.      Internal hemorrhoids were found during digital exam. The hemorrhoids       were moderate and large.      The exam was otherwise without abnormality on direct and retroflexion       views. Impression:               - Internal hemorrhoids that prolapse with                            straining, but require manual replacement into the                            anal canal (Grade III) found on digital rectal exam.                           - Diverticulosis in the sigmoid colon  and in the                            descending colon.                           - The examination was otherwise normal on direct                            and retroflexion views.                           - Internal hemorrhoids.                           - No specimens collected. Moderate Sedation:      Moderate (conscious) sedation was administered by the endoscopy nurse       and supervised by the endoscopist. The following parameters were       monitored: oxygen saturation, heart rate, blood pressure, respiratory       rate, EKG, adequacy of pulmonary ventilation, and response to care.       Total physician intraservice time was 21 minutes. Recommendation:           - Written discharge instructions were provided to                            the patient.                           - The signs and symptoms of potential delayed                            complications were discussed with  the patient.                           - Patient has a contact number available for                            emergencies.                           - Return to normal activities tomorrow.                           - Resume previous diet.                           - Continue present medications.                           - No repeat colonoscopy due to age.                           - Return to GI clinic in 4 weeks for hemorrhoid                            banding. Begin Benefiber 1 tablespoon twice daily.                            Use MiraLAX once 2. Daily as needed to manage                            constipation.. Procedure Code(s):        --- Professional ---                           [phone], Colonoscopy, flexible; diagnostic, including                            collection of specimen(s) by brushing or washing,                            when performed (separate procedure)                           99152, Moderate sedation services provided by the                            same physician or  other qualified health care                            professional performing the diagnostic or                            therapeutic service that the sedation supports,                            requiring the presence of an independent trained  observer to assist in the monitoring of the                            patient's level of consciousness and physiological                            status; initial 15 minutes of intraservice time,                            patient age 67 years or older Diagnosis Code(s):        --- Professional ---                           K64.2, Third degree hemorrhoids                           K92.1, Melena (includes Hematochezia)                           K57.30, Diverticulosis of large intestine without                            perforation or abscess without bleeding CPT copyright 2016 [employer]. All rights reserved. The codes documented in this report are preliminary and upon coder review may  be revised to meet current compliance requirements. Cristopher Estimable. Emelia Sandoval, MD Norvel Richards, MD 02/28/2017 12:23:20 PM This report has been signed electronically. Number of Addenda: 0

## 2017-02-28 NOTE — Discharge Instructions (Addendum)
Colonoscopy Discharge Instructions  Read the instructions outlined below and refer to this sheet in the next few weeks. These discharge instructions provide you with general information on caring for yourself after you leave the hospital. Your doctor may also give you specific instructions. While your treatment has been planned according to the most current medical practices available, unavoidable complications occasionally occur. If you have any problems or questions after discharge, call Dr. Gala Romney at [phone]. ACTIVITY  You may resume your regular activity, but move at a slower pace for the next 24 hours.   Take frequent rest periods for the next 24 hours.   Walking will help get rid of the air and reduce the bloated feeling in your belly (abdomen).   No driving for 24 hours (because of the medicine (anesthesia) used during the test).    Do not sign any important legal documents or operate any machinery for 24 hours (because of the anesthesia used during the test).  NUTRITION  Drink plenty of fluids.   You may resume your normal diet as instructed by your doctor.   Begin with a light meal and progress to your normal diet. Heavy or fried foods are harder to digest and may make you feel sick to your stomach (nauseated).   Avoid alcoholic beverages for 24 hours or as instructed.  MEDICATIONS  You may resume your normal medications unless your doctor tells you otherwise.  WHAT YOU CAN EXPECT TODAY  Some feelings of bloating in the abdomen.   Passage of more gas than usual.   Spotting of blood in your stool or on the toilet paper.  IF YOU HAD POLYPS REMOVED DURING THE COLONOSCOPY:  No aspirin products for 7 days or as instructed.   No alcohol for 7 days or as instructed.   Eat a soft diet for the next 24 hours.  FINDING OUT THE RESULTS OF YOUR TEST Not all test results are available during your visit. If your test results are not back during the visit, make an appointment  with your caregiver to find out the results. Do not assume everything is normal if you have not heard from your caregiver or the medical facility. It is important for you to follow up on all of your test results.  SEEK IMMEDIATE MEDICAL ATTENTION IF:  You have more than a spotting of blood in your stool.   Your belly is swollen (abdominal distention).   You are nauseated or vomiting.   You have a temperature over 101.   You have abdominal pain or discomfort that is severe or gets worse throughout the day.    Diverticulosis and hemorrhoid information provided  Pamphlet on hemorrhoid banding provided  Office visit with me in 3-4 weeks for hemorrhoid banding.  Begin Benefiber 1 tablespoon twice daily  Use MiraLAX 1 scoop Full daily as needed for constipation    Diverticulosis Diverticulosis is a condition that develops when small pouches (diverticula) form in the wall of the large intestine (colon). The colon is where water is absorbed and stool is formed. The pouches form when the inside layer of the colon pushes through weak spots in the outer layers of the colon. You may have a few pouches or many of them. What are the causes? The cause of this condition is not known. What increases the risk? The following factors may make you more likely to develop this condition:  Being older than age 41. Your risk for this condition increases with age. Diverticulosis is rare  among people younger than age 1. By age 37, many people have it.  Eating a low-fiber diet.  Having frequent constipation.  Being overweight.  Not getting enough exercise.  Smoking.  Taking over-the-counter pain medicines, like aspirin and ibuprofen.  Having a family history of diverticulosis. What are the signs or symptoms? In most people, there are no symptoms of this condition. If you do have symptoms, they may include:  Bloating.  Cramps in the abdomen.  Constipation or diarrhea.  Pain in the lower  left side of the abdomen. How is this diagnosed? This condition is most often diagnosed during an exam for other colon problems. Because diverticulosis usually has no symptoms, it often cannot be diagnosed independently. This condition may be diagnosed by:  Using a flexible scope to examine the colon (colonoscopy).  Taking an X-ray of the colon after dye has been put into the colon (barium enema).  Doing a CT scan. How is this treated? You may not need treatment for this condition if you have never developed an infection related to diverticulosis. If you have had an infection before, treatment may include:  Eating a high-fiber diet. This may include eating more fruits, vegetables, and grains.  Taking a fiber supplement.  Taking a live bacteria supplement (probiotic).  Taking medicine to relax your colon.  Taking antibiotic medicines. Follow these instructions at home:  Drink 6-8 glasses of water or more each day to prevent constipation.  Try not to strain when you have a bowel movement.  If you have had an infection before:  Eat more fiber as directed by your health care provider or your diet and nutrition specialist (dietitian).  Take a fiber supplement or probiotic, if your health care provider approves.  Take over-the-counter and prescription medicines only as told by your health care provider.  If you were prescribed an antibiotic, take it as told by your health care provider. Do not stop taking the antibiotic even if you start to feel better.  Keep all follow-up visits as told by your health care provider. This is important. Contact a health care provider if:  You have pain in your abdomen.  You have bloating.  You have cramps.  You have not had a bowel movement in 3 days. Get help right away if:  Your pain gets worse.  Your bloating becomes very bad.  You have a fever or chills, and your symptoms suddenly get worse.  You vomit.  You have bowel movements  that are bloody or black.  You have bleeding from your rectum. Summary  Diverticulosis is a condition that develops when small pouches (diverticula) form in the wall of the large intestine (colon).  You may have a few pouches or many of them.  This condition is most often diagnosed during an exam for other colon problems.  If you have had an infection related to diverticulosis, treatment may include increasing the fiber in your diet, taking supplements, or taking medicines. This information is not intended to replace advice given to you by your health care provider. Make sure you discuss any questions you have with your health care provider. Document Released: 06/28/2004 Document Revised: 08/20/2016 Document Reviewed: 08/20/2016 Elsevier Interactive Patient Education  2017 [place].     Hemorrhoids Hemorrhoids are swollen veins in and around the rectum or anus. There are two types of hemorrhoids:  Internal hemorrhoids. These occur in the veins that are just inside the rectum. They may poke through to the outside and become irritated  and painful.  External hemorrhoids. These occur in the veins that are outside of the anus and can be felt as a painful swelling or hard lump near the anus. Most hemorrhoids do not cause serious problems, and they can be managed with home treatments such as diet and lifestyle changes. If home treatments do not help your symptoms, procedures can be done to shrink or remove the hemorrhoids. What are the causes? This condition is caused by increased pressure in the anal area. This pressure may result from various things, including:  Constipation.  Straining to have a bowel movement.  Diarrhea.  Pregnancy.  Obesity.  Sitting for long periods of time.  Heavy lifting or other activity that causes you to strain.  Anal sex. What are the signs or symptoms? Symptoms of this condition include:  Pain.  Anal itching or irritation.  Rectal  bleeding.  Leakage of stool (feces).  Anal swelling.  One or more lumps around the anus. How is this diagnosed? This condition can often be diagnosed through a visual exam. Other exams or tests may also be done, such as:  Examination of the rectal area with a gloved hand (digital rectal exam).  Examination of the anal canal using a small tube (anoscope).  A blood test, if you have lost a significant amount of blood.  A test to look inside the colon (sigmoidoscopy or colonoscopy). How is this treated? This condition can usually be treated at home. However, various procedures may be done if dietary changes, lifestyle changes, and other home treatments do not help your symptoms. These procedures can help make the hemorrhoids smaller or remove them completely. Some of these procedures involve surgery, and others do not. Common procedures include:  Rubber band ligation. Rubber bands are placed at the base of the hemorrhoids to cut off the blood supply to them.  Sclerotherapy. Medicine is injected into the hemorrhoids to shrink them.  Infrared coagulation. A type of light energy is used to get rid of the hemorrhoids.  Hemorrhoidectomy surgery. The hemorrhoids are surgically removed, and the veins that supply them are tied off.  Stapled hemorrhoidopexy surgery. A circular stapling device is used to remove the hemorrhoids and use staples to cut off the blood supply to them. Follow these instructions at home: Eating and drinking   Eat foods that have a lot of fiber in them, such as whole grains, beans, nuts, fruits, and vegetables. Ask your health care provider about taking products that have added fiber (fiber supplements).  Drink enough fluid to keep your urine clear or pale yellow. Managing pain and swelling   Take warm sitz baths for 20 minutes, 3-4 times a day to ease pain and discomfort.  If directed, apply ice to the affected area. Using ice packs between sitz baths may be  helpful.  Put ice in a plastic bag.  Place a towel between your skin and the bag.  Leave the ice on for 20 minutes, 2-3 times a day. General instructions   Take over-the-counter and prescription medicines only as told by your health care provider.  Use medicated creams or suppositories as told.  Exercise regularly.  Go to the bathroom when you have the urge to have a bowel movement. Do not wait.  Avoid straining to have bowel movements.  Keep the anal area dry and clean. Use wet toilet paper or moist towelettes after a bowel movement.  Do not sit on the toilet for long periods of time. This increases blood pooling and  pain. Contact a health care provider if:  You have increasing pain and swelling that are not controlled by treatment or medicine.  You have uncontrolled bleeding.  You have difficulty having a bowel movement, or you are unable to have a bowel movement.  You have pain or inflammation outside the area of the hemorrhoids. This information is not intended to replace advice given to you by your health care provider. Make sure you discuss any questions you have with your health care provider. Document Released: 09/28/2000 Document Revised: 02/29/2016 Document Reviewed: 06/15/2015 Elsevier Interactive Patient Education  2017 [employer].

## 2017-03-08 ENCOUNTER — Encounter (HOSPITAL_COMMUNITY): Payer: Self-pay | Admitting: Internal Medicine

## 2017-03-29 ENCOUNTER — Other Ambulatory Visit: Payer: Self-pay | Admitting: Cardiovascular Disease

## 2017-04-11 DIAGNOSIS — I10 Essential (primary) hypertension: Secondary | ICD-10-CM | POA: Diagnosis not present

## 2017-04-11 DIAGNOSIS — E871 Hypo-osmolality and hyponatremia: Secondary | ICD-10-CM | POA: Diagnosis not present

## 2017-04-11 DIAGNOSIS — R7301 Impaired fasting glucose: Secondary | ICD-10-CM | POA: Diagnosis not present

## 2017-04-11 DIAGNOSIS — R972 Elevated prostate specific antigen [PSA]: Secondary | ICD-10-CM | POA: Diagnosis not present

## 2017-04-11 DIAGNOSIS — E782 Mixed hyperlipidemia: Secondary | ICD-10-CM | POA: Diagnosis not present

## 2017-04-11 DIAGNOSIS — I671 Cerebral aneurysm, nonruptured: Secondary | ICD-10-CM | POA: Diagnosis not present

## 2017-04-11 DIAGNOSIS — Z0001 Encounter for general adult medical examination with abnormal findings: Secondary | ICD-10-CM | POA: Diagnosis not present

## 2017-04-11 DIAGNOSIS — Z6834 Body mass index (BMI) 34.0-34.9, adult: Secondary | ICD-10-CM | POA: Diagnosis not present

## 2017-04-11 DIAGNOSIS — M1991 Primary osteoarthritis, unspecified site: Secondary | ICD-10-CM | POA: Diagnosis not present

## 2017-04-16 ENCOUNTER — Encounter: Payer: Self-pay | Admitting: Internal Medicine

## 2017-04-16 ENCOUNTER — Ambulatory Visit (INDEPENDENT_AMBULATORY_CARE_PROVIDER_SITE_OTHER): Payer: Medicare Other | Admitting: Internal Medicine

## 2017-04-16 VITALS — BP 147/79 | HR 67 | Temp 98.0°F | Ht 68.5 in | Wt 227.2 lb

## 2017-04-16 DIAGNOSIS — K648 Other hemorrhoids: Secondary | ICD-10-CM

## 2017-04-16 NOTE — Progress Notes (Signed)
[place] banding procedure note:  The patient presents with symptomatic grade 3 hemorrhoids unresponsive to maximal medical therapy requesting rubber band ligation of hisr hemorrhoidal disease. All risks, benefits, and alternative forms of therapy were described and informed consent was obtained.  In the left lateral decubitus position,  A DRE and anoscopy performed utilizing 0.125% nitroglycerin and 2% Xylocaine cream as lubricant. Patient has a prominent left lateral hemorrhoid column was smaller hemorrhoid columns present.   The decision was made to band the left internal hemorrhoid; the [place] was used to perform band ligation without complication. Digital anorectal examination was then performed to assure proper positioning of the band;  band found to be in excellent position. No pinching or pain. The patient was discharged home without pain or other issues. Dietary and behavioral recommendations were given and follow-up instructions. The patient will return in 4 weeks for followup and possible additional banding as required.  No complications were encountered and the patient tolerated the procedure well.

## 2017-04-16 NOTE — Patient Instructions (Signed)
Avoid straining.  Benefiber 1 tablespoon  twice daily  Limit toilet time to 5 minutes  Miralax - one capful at bedtime   Call with any interim problems  Schedule followup appointment in 4 weeks from now

## 2017-04-18 DIAGNOSIS — Z6835 Body mass index (BMI) 35.0-35.9, adult: Secondary | ICD-10-CM | POA: Diagnosis not present

## 2017-04-18 DIAGNOSIS — M21371 Foot drop, right foot: Secondary | ICD-10-CM | POA: Diagnosis not present

## 2017-04-18 DIAGNOSIS — K649 Unspecified hemorrhoids: Secondary | ICD-10-CM | POA: Diagnosis not present

## 2017-04-18 DIAGNOSIS — H9319 Tinnitus, unspecified ear: Secondary | ICD-10-CM | POA: Diagnosis not present

## 2017-04-18 DIAGNOSIS — I10 Essential (primary) hypertension: Secondary | ICD-10-CM | POA: Diagnosis not present

## 2017-04-18 DIAGNOSIS — R972 Elevated prostate specific antigen [PSA]: Secondary | ICD-10-CM | POA: Diagnosis not present

## 2017-04-18 DIAGNOSIS — E782 Mixed hyperlipidemia: Secondary | ICD-10-CM | POA: Diagnosis not present

## 2017-04-18 DIAGNOSIS — E871 Hypo-osmolality and hyponatremia: Secondary | ICD-10-CM | POA: Diagnosis not present

## 2017-05-28 DIAGNOSIS — H401121 Primary open-angle glaucoma, left eye, mild stage: Secondary | ICD-10-CM | POA: Diagnosis not present

## 2017-06-04 ENCOUNTER — Ambulatory Visit (INDEPENDENT_AMBULATORY_CARE_PROVIDER_SITE_OTHER): Payer: Medicare Other | Admitting: Urology

## 2017-06-04 DIAGNOSIS — N5201 Erectile dysfunction due to arterial insufficiency: Secondary | ICD-10-CM

## 2017-06-04 DIAGNOSIS — N401 Enlarged prostate with lower urinary tract symptoms: Secondary | ICD-10-CM

## 2017-06-04 DIAGNOSIS — R351 Nocturia: Secondary | ICD-10-CM

## 2017-06-04 DIAGNOSIS — R972 Elevated prostate specific antigen [PSA]: Secondary | ICD-10-CM

## 2017-06-11 ENCOUNTER — Encounter: Payer: Self-pay | Admitting: Internal Medicine

## 2017-06-11 ENCOUNTER — Ambulatory Visit (INDEPENDENT_AMBULATORY_CARE_PROVIDER_SITE_OTHER): Payer: Medicare Other | Admitting: Internal Medicine

## 2017-06-11 VITALS — BP 154/76 | HR 54 | Temp 96.7°F | Ht 69.0 in | Wt 228.0 lb

## 2017-06-11 DIAGNOSIS — K648 Other hemorrhoids: Secondary | ICD-10-CM

## 2017-06-11 DIAGNOSIS — K641 Second degree hemorrhoids: Secondary | ICD-10-CM

## 2017-06-11 NOTE — Patient Instructions (Signed)
Avoid straining.  Benefiber I tablespoon daily  Limit toilet time to 5 minutes  Continue Miralax daily as needed  Call with any interim problems  Schedule followup appointment in 6 weeks from now

## 2017-06-11 NOTE — Progress Notes (Signed)
[place] banding procedure note:  The patient presents with symptomatic grade 2 hemorrhoids;  status post banding of the left lateral hemorrhoid several weeks ago. He states, globally, all of  his hemorrhoid symptoms have improved. He doesn't like taking Benefiber twice daily because of bloating doesn't think it makes much difference. He does take MiraLAX daily and achieves a reasonably good bowel movement every day as he reports. He desires a second band today.  Risks benefits and limitations again reviewed today. Questions answered. All parties agreeable.  In the left lateral decubitus position, a DRE with .125% nitroglycerin and Xylocaine ointment use for topical anesthetic/lubricant - revealed no abnormalities. No external abnormalities. The decision was made to band the right anterior internal hemorrhoid; the [place] was used to perform band ligation without complication. Digital anorectal examination was then performed to assure proper positioning of the band;  band found to be in excellent position-no pinching or pain. The patient was discharged home without pain or other issues.   Dietary and behavioral recommendations were given along with follow-up instructions. The patient will return in 6 weeks for followup and possible additional banding as required.  No complications were encountered and the patient tolerated the procedure well.

## 2017-06-20 DIAGNOSIS — R05 Cough: Secondary | ICD-10-CM | POA: Diagnosis not present

## 2017-06-20 DIAGNOSIS — Z6833 Body mass index (BMI) 33.0-33.9, adult: Secondary | ICD-10-CM | POA: Diagnosis not present

## 2017-06-29 ENCOUNTER — Encounter (HOSPITAL_COMMUNITY): Payer: Self-pay

## 2017-06-29 ENCOUNTER — Emergency Department (HOSPITAL_COMMUNITY): Payer: Medicare Other

## 2017-06-29 ENCOUNTER — Emergency Department (HOSPITAL_COMMUNITY)
Admission: EM | Admit: 2017-06-29 | Discharge: 2017-06-29 | Disposition: A | Discharge status: Home / Self Care | Payer: Medicare Other | Attending: Emergency Medicine | Admitting: Emergency Medicine

## 2017-06-29 DIAGNOSIS — M25569 Pain in unspecified knee: Secondary | ICD-10-CM | POA: Diagnosis not present

## 2017-06-29 DIAGNOSIS — R55 Syncope and collapse: Secondary | ICD-10-CM | POA: Diagnosis not present

## 2017-06-29 DIAGNOSIS — R42 Dizziness and giddiness: Secondary | ICD-10-CM | POA: Diagnosis not present

## 2017-06-29 DIAGNOSIS — Z79899 Other long term (current) drug therapy: Secondary | ICD-10-CM | POA: Diagnosis not present

## 2017-06-29 DIAGNOSIS — I10 Essential (primary) hypertension: Secondary | ICD-10-CM | POA: Insufficient documentation

## 2017-06-29 DIAGNOSIS — Z87891 Personal history of nicotine dependence: Secondary | ICD-10-CM | POA: Diagnosis not present

## 2017-06-29 DIAGNOSIS — R404 Transient alteration of awareness: Secondary | ICD-10-CM | POA: Diagnosis not present

## 2017-06-29 DIAGNOSIS — R531 Weakness: Secondary | ICD-10-CM | POA: Diagnosis not present

## 2017-06-29 DIAGNOSIS — R05 Cough: Secondary | ICD-10-CM | POA: Diagnosis not present

## 2017-06-29 LAB — COMPREHENSIVE METABOLIC PANEL
ALT: 20 U/L (ref 17–63)
ANION GAP: 10 (ref 5–15)
AST: 26 U/L (ref 15–41)
Albumin: 3.3 g/dL — ABNORMAL LOW (ref 3.5–5.0)
Alkaline Phosphatase: 63 U/L (ref 38–126)
BUN: 15 mg/dL (ref 6–20)
CHLORIDE: 94 mmol/L — AB (ref 101–111)
CO2: 25 mmol/L (ref 22–32)
CREATININE: 0.81 mg/dL (ref 0.61–1.24)
Calcium: 8.4 mg/dL — ABNORMAL LOW (ref 8.9–10.3)
Glucose, Bld: 100 mg/dL — ABNORMAL HIGH (ref 65–99)
Potassium: 2.7 mmol/L — CL (ref 3.5–5.1)
SODIUM: 129 mmol/L — AB (ref 135–145)
Total Bilirubin: 0.4 mg/dL (ref 0.3–1.2)
Total Protein: 6.6 g/dL (ref 6.5–8.1)

## 2017-06-29 LAB — CBC WITH DIFFERENTIAL/PLATELET
Basophils Absolute: 0 10*3/uL (ref 0.0–0.1)
Basophils Relative: 0 %
EOS ABS: 0.1 10*3/uL (ref 0.0–0.7)
Eosinophils Relative: 1 %
HCT: 34.4 % — ABNORMAL LOW (ref 39.0–52.0)
Hemoglobin: 11.9 g/dL — ABNORMAL LOW (ref 13.0–17.0)
LYMPHS ABS: 0.3 10*3/uL — AB (ref 0.7–4.0)
Lymphocytes Relative: 2 %
MCH: 30.5 pg (ref 26.0–34.0)
MCHC: 34.6 g/dL (ref 30.0–36.0)
MCV: 88.2 fL (ref 78.0–100.0)
MONO ABS: 0.1 10*3/uL (ref 0.1–1.0)
Monocytes Relative: 1 %
Neutro Abs: 16 10*3/uL — ABNORMAL HIGH (ref 1.7–7.7)
Neutrophils Relative %: 96 %
Platelets: 350 10*3/uL (ref 150–400)
RBC: 3.9 MIL/uL — AB (ref 4.22–5.81)
RDW: 14.1 % (ref 11.5–15.5)
WBC: 16.5 10*3/uL — AB (ref 4.0–10.5)

## 2017-06-29 LAB — TROPONIN I

## 2017-06-29 MED ORDER — POTASSIUM CHLORIDE CRYS ER 20 MEQ PO TBCR
40.0000 meq | EXTENDED_RELEASE_TABLET | Freq: Once | ORAL | Status: AC
  Administered 2017-06-29: 40 meq via ORAL
  Filled 2017-06-29: qty 2

## 2017-06-29 MED ORDER — POTASSIUM CHLORIDE 10 MEQ/100ML IV SOLN
10.0000 meq | Freq: Once | INTRAVENOUS | Status: AC
  Administered 2017-06-29: 10 meq via INTRAVENOUS
  Filled 2017-06-29: qty 100

## 2017-06-29 MED ORDER — POTASSIUM CHLORIDE CRYS ER 20 MEQ PO TBCR: 20.0000 meq | EXTENDED_RELEASE_TABLET | Freq: Two times a day (BID) | ORAL | 0 refills | Status: DC

## 2017-06-29 MED ORDER — SODIUM CHLORIDE 0.9 % IV BOLUS (SEPSIS)
1000.0000 mL | Freq: Once | INTRAVENOUS | Status: AC
  Administered 2017-06-29: 1000 mL via INTRAVENOUS

## 2017-06-29 NOTE — ED Notes (Addendum)
CRITICAL VALUE ALERT  Critical Value:  Potassium 2.7  Date & Time Notied:  06/29/17 @ 1722  Provider Notified: Dr. Roderic Palau made aware  Orders Received/Actions taken: EDP made aware

## 2017-06-29 NOTE — ED Triage Notes (Signed)
Per ems pt walked into his house at approx 1340 and had sudden onset of dizziness and generalized weakness.  Says he held onto something and slid to the floor.  Started zpack for respiratory infection yesterday.  EMS says lying bp was 123/60 and 90/40 with sitting.  Sinus brady on monitor per ems, hr 52-56.  Pt says when he went down on his kness, he started having pain in both knees.  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  CBG 153.

## 2017-06-29 NOTE — ED Notes (Signed)
ED Provider at bedside. 

## 2017-06-29 NOTE — ED Triage Notes (Signed)
Per pt, he had gone out to eat for lunch and had to have a bm.  Reports had a large bm in the restaurant and then had another one at home.  Reports left again to go to the store and started feeling weak in the store and had to go out to the car.   Pt says the last thing he remembers is walking in the door at home and disarming his alarm.  Pt has had a cough for 3 weeks and had been on an antibiotic for 14 days before starting the zpack yesterday.

## 2017-06-29 NOTE — ED Provider Notes (Signed)
[facility] DEPT Provider Note   CSN: [id number] Arrival date & time: 06/29/17  1500     History   Chief Complaint Chief Complaint  Patient presents with  . Loss of Consciousness    HPI Julian Fowler is a 82 y.o. male.  Patient states that he had a couple large bowel movements today and then little while later he was at home toward felt dizzy and almost completely passed out. Patient feels fine now   The history is provided by the patient.  Loss of Consciousness   This is a new problem. The current episode started 1 to 2 hours ago. The problem occurs rarely. The problem has been resolved. There was no loss of consciousness. The problem is associated with normal activity. Associated symptoms include dizziness and visual change. Pertinent negatives include abdominal pain, back pain, chest pain, congestion, headaches and seizures. He has tried nothing for the symptoms.    Past Medical History:  Diagnosis Date  . Abdominal aorta injury    Normal size, ultrasound, March 17,2011  . Allergic rhinitis   . Arthritis   . Asbestos exposure Jan 2001   Hx of asbestos related pleural plaques  . Balance problem   . BPH (benign prostatic hyperplasia)   . Complication of anesthesia    "trouble waking up"  . Easy bruisability   . ED (erectile dysfunction)   . Ejection fraction    60%, echo, 2008, mild RV dysfunction  . GERD (gastroesophageal reflux disease)   . H/O hiatal hernia   . Hearing loss in left ear   . History of shingles    "mild"  . Hyperlipidemia    under control  . Hypertension December 29, 2009   renal artery Doppler , normal  12/2009  . Overactive bladder   . Sleep apnea    was dx 10 years ago reports lost a lot of weight and no longer machine  . Tinnitus     Patient Active Problem List   Diagnosis Date Noted  . Rectal bleeding 01/24/2017  . Hemorrhoids 12/18/2016  . BRBPR (bright red blood per rectum) 12/18/2016  . 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 Peripheral edema 10/15/2013  . Hyponatremia 07/07/2013  . Hypokalemia 07/07/2013  . OA (osteoarthritis) of knee 07/06/2013  . Preop cardiovascular exam 06/26/2013  . Chronic insomnia 09/29/2012  . Cardiac murmur 08/27/2012  . Hearing impaired 04/10/2012  . Easy bruisability   . Anxiety   . Hyperlipidemia   . Abdominal aorta injury   . Asbestos exposure   . Overactive bladder   . ED (erectile dysfunction)   . SVT (supraventricular tachycardia) ([place])   . OSA (obstructive sleep apnea)   . Chest tightness   . Ejection fraction   . Essential hypertension 12/29/2009  . Syncope 09/14/2009  . TOBACCO ABUSE, HX OF 07/15/2009  . BACK PAIN 06/22/2009  . INTESTINAL GAS 06/22/2009  . 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 Seasonal and perennial allergic rhinitis 01/15/2008  . ECZEMA 11/18/2007  . Asthma, mild intermittent, well-controlled 11/03/2007  . GERD 04/14/2007  . ARTHRITIS 04/14/2007  . KNEE PAIN, LEFT 04/14/2007    Past Surgical History:  Procedure Laterality Date  . BACK SURGERY  2014   herniated L1, L2   Dr Carloyn Manner  . BRAIN SURGERY    . CEREBRAL EMBOLIZATION  12/2011   "radiation therapy-did not work"  . CHOLECYSTECTOMY  6/98  . COLONOSCOPY  09/2009   Dr. Lindalou Hose: normal, internal  hemorrhoids   . COLONOSCOPY N/A 02/28/2017   Procedure: COLONOSCOPY;  Surgeon: [name], MD;  Location: [facility];  Service: Endoscopy;  Laterality: N/A;  730   . CRANIECTOMY FOR EXCISION OF ACOUSTIC NEUROMA  3/95  . RADIOLOGY WITH ANESTHESIA N/A 07/07/2014   Procedure: EMBOLIZATION;  Surgeon: Rob Hickman, MD;  Location: [facility];  Service: Radiology;  Laterality: N/A;  . TOTAL KNEE ARTHROPLASTY Left 07/06/2013   Procedure: LEFT TOTAL KNEE ARTHROPLASTY;  Surgeon: Gearlean Alf, MD;  Location: [facility];  Service: Orthopedics;  Laterality: Left;  . TOTAL KNEE ARTHROPLASTY Right 01/18/2014   Procedure: RIGHT TOTAL KNEE ARTHROPLASTY;  Surgeon: Gearlean Alf, MD;  Location: [facility];  Service: Orthopedics;  Laterality: Right;  . TRIGGER FINGER RELEASE  2003   (thumb) middle finger (2006)       Home Medications    Prior to Admission medications   Medication Sig Start Date End Date Taking? Authorizing Provider  acetaminophen (TYLENOL) 325 MG tablet Take 325 mg by mouth every 6 (six) hours as needed for pain.    Yes [provider]  azithromycin (ZITHROMAX) 250 MG tablet Take 250-500 mg by mouth See admin instructions. Take two tablets byt mouth on day 1 starting on 06/28/2017, then take one tablet daily for four days thereafter then stop 06/28/17  Yes [provider]  Calcium Carb-Vit D-C-E-Mineral (OS-CAL ULTRA) 600 MG TABS Take 1 tablet by mouth daily.    Yes [provider]  CARTIA XT 120 MG 24 hr capsule TAKE ONE CAPSULE BY MOUTH TWICE DAILY 02/21/17  Yes Herminio Commons, MD  cefUROXime (CEFTIN) 500 MG tablet Take 1 tablet by mouth 2 (two) times daily. 7 day course starting on 06/26/2017 06/26/17  Yes [provider]  esomeprazole (NEXIUM) 40 MG capsule Take 40 mg by mouth daily at 12 noon.   Yes [provider]  fexofenadine (ALLEGRA) 180 MG tablet Take 180 mg by mouth daily as needed. For allergies   Yes [provider]  fluticasone (FLONASE) 50 MCG/ACT nasal spray Place 2 sprays into both nostrils daily. Patient taking differently: Place 2 sprays into both nostrils daily as needed for allergies.  05/03/16  Yes Young, Tarri Fuller D, MD  furosemide (LASIX) 40 MG tablet Take 40 mg by mouth daily. 06/15/17  Yes [provider]  hydrALAZINE (APRESOLINE) 25 MG tablet Take 1 tablet (25 mg total) by mouth 3 (three) times daily. 01/31/17 06/29/17 Yes Herminio Commons, MD  hydrochlorothiazide (HYDRODIURIL) 25 MG tablet Take 25 mg by mouth daily.   Yes [provider]  latanoprost (XALATAN) 0.005 % ophthalmic solution Place 1 drop into both eyes at bedtime.  09/01/12  Yes  [provider]  LORazepam (ATIVAN) 1 MG tablet TAKE ONE TO TWO TABLETS BY MOUTH AT BEDTIME AS NEEDED 02/05/17  Yes Noralee Space, MD  losartan (COZAAR) 100 MG tablet Take 100 mg by mouth every morning.    Yes [provider]  Multiple Vitamins-Minerals (CENTRUM SILVER PO) Take 1 tablet by mouth daily.    Yes [provider]  polyethylene glycol (MIRALAX / GLYCOLAX) packet Take 17 g by mouth daily.   Yes [provider]  pravastatin (PRAVACHOL) 40 MG tablet Take 1 tablet by mouth at bedtime.  10/05/14  Yes [provider]  Wheat Dextrin (BENEFIBER) POWD Take by mouth 2 (two) times daily. Takes 1 Tbsp twice daily   Yes [provider]  zolpidem (AMBIEN) 10 MG  tablet Take 1 tablet by mouth at bedtime.  01/24/16  Yes [provider]  potassium chloride SA (K-DUR,KLOR-CON) 20 MEQ tablet Take 1 tablet (20 mEq total) by mouth 2 (two) times daily. 06/29/17   Milton Ferguson, MD    Family History Family History  Problem Relation Age of Onset  . Cancer Father        oral cancer  . Breast cancer Mother   . Heart attack Mother   . Hypertension Sister        Bypass x4  . Alcohol abuse Sister   . Alzheimer's disease Sister   . Kidney disease Sister   . Colon cancer Neg Hx     Social History Social History  Substance Use Topics  . Smoking status: Former Smoker    Packs/day: 1.50    Years: 30.00    Types: Cigarettes    Start date: 04/24/1957    Quit date: 10/15/1986  . Smokeless tobacco: Never Used  . Alcohol use No     Comment: Used to drink heavily at times     Allergies   Codeine and Tape   Review of Systems Review of Systems  Constitutional: Negative for appetite change and fatigue.  HENT: Negative for congestion, ear discharge and sinus pressure.   Eyes: Negative for discharge.  Respiratory: Positive for cough.   Cardiovascular: Positive for syncope. Negative for chest pain.  Gastrointestinal: Negative for abdominal  pain and diarrhea.  Genitourinary: Negative for frequency and hematuria.  Musculoskeletal: Negative for back pain.  Skin: Negative for rash.  Neurological: Positive for dizziness. Negative for seizures and headaches.  Psychiatric/Behavioral: Negative for hallucinations.     Physical Exam Updated Vital Signs BP (!) 141/62   Pulse (!) 56   Temp 98.4 F (36.9 C) (Oral)   Resp 11   Ht 5\' 9"  (1.753 m)   Wt 103.4 kg (228 lb)   SpO2 96%   BMI 33.67 kg/m   Physical Exam  Constitutional: He is oriented to person, place, and time. He appears well-developed.  HENT:  Head: Normocephalic.  Eyes: Conjunctivae and EOM are normal. No scleral icterus.  Neck: Neck supple. No thyromegaly present.  Cardiovascular: Normal rate and regular rhythm.  Exam reveals no gallop and no friction rub.   No murmur heard. Pulmonary/Chest: No stridor. He has no wheezes. He has no rales. He exhibits no tenderness.  Abdominal: He exhibits no distension. There is no tenderness. There is no rebound.  Musculoskeletal: Normal range of motion. He exhibits no edema.  Lymphadenopathy:    He has no cervical adenopathy.  Neurological: He is oriented to person, place, and time. He exhibits normal muscle tone. Coordination normal.  Skin: No rash noted. No erythema.  Psychiatric: He has a normal mood and affect. His behavior is normal.     ED Treatments / Results  Labs (all labs ordered are listed, but only abnormal results are displayed) Labs Reviewed  CBC WITH DIFFERENTIAL/PLATELET - Abnormal; Notable for the following:       Result Value   WBC 16.5 (*)    RBC 3.90 (*)    Hemoglobin 11.9 (*)    HCT 34.4 (*)    Neutro Abs 16.0 (*)    Lymphs Abs 0.3 (*)    All other components within normal limits  COMPREHENSIVE METABOLIC PANEL - Abnormal; Notable for the following:    Sodium 129 (*)    Potassium 2.7 (*)    Chloride 94 (*)    Glucose, Bld  100 (*)    Calcium 8.4 (*)    Albumin 3.3 (*)    All other  components within normal limits  TROPONIN I    EKG  EKG Interpretation None       Radiology Dg Chest 2 View  Result Date: 06/29/2017 CLINICAL DATA:  Cough and congestion. EXAM: CHEST  2 VIEW COMPARISON:  06/20/2017 FINDINGS: Normal heart size. Aortic atherosclerosis. No pleural effusion or edema. Bilateral calcified pleural plaques are identified compatible with asbestos related pleural disease. No superimposed airspace consolidation. IMPRESSION: 1. No acute findings. 2.  Aortic Atherosclerosis (ICD10-I70.0). 3. Asbestos related pleural disease identified. Electronically Signed   By: Kerby Moors M.D.   On: 06/29/2017 18:14   Ct Head Wo Contrast  Result Date: 06/29/2017 CLINICAL DATA:  Patient was walking into house and expands sudden onset of dizziness and generalized weakness. Started Z-Pak for respiratory infection yesterday. History of left superior cerebellar dural AV fistula treated with embolization radio surgery. 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 EXAM: CT HEAD WITHOUT CONTRAST TECHNIQUE: Contiguous axial images were obtained from the base of the skull through the vertex without intravenous contrast. COMPARISON:  07/12/2014 FINDINGS: Brain: Mild superficial and central atrophy with chronic minimal small vessel ischemic disease of periventricular white matter. No intra-axial mass nor extra-axial fluid collections. No hemorrhage or midline shift. Embolization coils are seen along the course of the left sigmoid sinus and within a left emissary vein and superficial left suboccipital scalp similar finding to prior. Vascular: No hyperdense vessels Skull: Status post left partial mastoidectomy and partial resection of left buttock capsule. Resection of the posterior wall of the left IAC. Partial resection of the semicircular canals on the left. The external auditory canal and tympanic membrane region is unremarkable. The left ossicles appear intact.  Sinuses/Orbits: Intact orbits and globes. No retrobulbar abnormality. No acute sinus disease. Other: None IMPRESSION: 1. Mild atrophy with chronic appearing small vessel ischemic disease. No acute intracranial abnormality. 2. Postop change of the left temporal bone appears stable. 3. Hyperdensities along the left sigmoid sinus and posterior left cerebellum from prior embolization of cerebellar dural AV fistula appears unchanged and stable. Electronically Signed   By: Ashley Royalty M.D.   On: 06/29/2017 16:39    Procedures Procedures (including critical care time)  Medications Ordered in ED Medications  sodium chloride 0.9 % bolus 1,000 mL (1,000 mLs Intravenous New Bag/Given 06/29/17 1540)  potassium chloride 10 mEq in 100 mL IVPB (10 mEq Intravenous New Bag/Given 06/29/17 1735)  potassium chloride SA (K-DUR,KLOR-CON) CR tablet 40 mEq (40 mEq Oral Given 06/29/17 1816)     Initial Impression / Assessment and Plan / ED Course  I have reviewed the triage vital signs and the nursing notes.  Pertinent labs & imaging results that were available during my care of the patient were reviewed by me and considered in my medical decision making (see chart for details). Patient with a chronic left groin infection on Ceftin and hypokalemia. Patient was offered admission for the hypokalemia and near syncopal episode. Patient refused admission.  He was given potassium here and also told to double up on his potassium He will follow-up with his doctor this week      Final Clinical Impressions(s) / ED Diagnoses   Final diagnoses:  Near syncope    New Prescriptions New Prescriptions   POTASSIUM CHLORIDE SA (K-DUR,KLOR-CON) 20 MEQ TABLET    Take 1 tablet (20 mEq total) by mouth 2 (two) times daily.  Milton Ferguson, MD 06/29/17 [phone]

## 2017-06-29 NOTE — Discharge Instructions (Signed)
Rest at home this weekend. Increase your potassium up to 2 pills a day and follow-up with your doctor this week return if problems

## 2017-07-01 DIAGNOSIS — E876 Hypokalemia: Secondary | ICD-10-CM | POA: Diagnosis not present

## 2017-07-01 DIAGNOSIS — R55 Syncope and collapse: Secondary | ICD-10-CM | POA: Diagnosis not present

## 2017-07-01 DIAGNOSIS — E871 Hypo-osmolality and hyponatremia: Secondary | ICD-10-CM | POA: Diagnosis not present

## 2017-07-01 DIAGNOSIS — R600 Localized edema: Secondary | ICD-10-CM | POA: Diagnosis not present

## 2017-07-01 DIAGNOSIS — Z6835 Body mass index (BMI) 35.0-35.9, adult: Secondary | ICD-10-CM | POA: Diagnosis not present

## 2017-07-01 DIAGNOSIS — R05 Cough: Secondary | ICD-10-CM | POA: Diagnosis not present

## 2017-07-01 DIAGNOSIS — I10 Essential (primary) hypertension: Secondary | ICD-10-CM | POA: Diagnosis not present

## 2017-07-01 DIAGNOSIS — M79672 Pain in left foot: Secondary | ICD-10-CM | POA: Diagnosis not present

## 2017-07-01 DIAGNOSIS — M25562 Pain in left knee: Secondary | ICD-10-CM | POA: Diagnosis not present

## 2017-07-09 ENCOUNTER — Telehealth: Payer: Self-pay | Admitting: Internal Medicine

## 2017-07-10 NOTE — Telephone Encounter (Signed)
Nothing in this phone note.  Will close

## 2017-07-11 ENCOUNTER — Ambulatory Visit (INDEPENDENT_AMBULATORY_CARE_PROVIDER_SITE_OTHER): Payer: Medicare Other | Admitting: Internal Medicine

## 2017-07-11 ENCOUNTER — Encounter: Payer: Self-pay | Admitting: Internal Medicine

## 2017-07-11 VITALS — BP 122/76 | HR 66 | Ht 69.0 in | Wt 225.0 lb

## 2017-07-11 DIAGNOSIS — J45901 Unspecified asthma with (acute) exacerbation: Secondary | ICD-10-CM

## 2017-07-11 DIAGNOSIS — J4520 Mild intermittent asthma, uncomplicated: Secondary | ICD-10-CM

## 2017-07-11 DIAGNOSIS — Z77090 Contact with and (suspected) exposure to asbestos: Secondary | ICD-10-CM | POA: Diagnosis not present

## 2017-07-11 DIAGNOSIS — Z23 Encounter for immunization: Secondary | ICD-10-CM

## 2017-07-11 MED ORDER — FLUTICASONE-UMECLIDIN-VILANT 100-62.5-25 MCG/INH IN AEPB: 1.0000 | INHALATION_SPRAY | Freq: Every day | RESPIRATORY_TRACT | 0 refills | Status: DC

## 2017-07-11 MED ORDER — LEVALBUTEROL HCL 0.63 MG/3ML IN NEBU
0.6300 mg | INHALATION_SOLUTION | Freq: Once | RESPIRATORY_TRACT | Status: AC
  Administered 2017-07-11: 0.63 mg via RESPIRATORY_TRACT

## 2017-07-11 MED ORDER — METHYLPREDNISOLONE ACETATE 80 MG/ML IJ SUSP
80.0000 mg | Freq: Once | INTRAMUSCULAR | Status: AC
  Administered 2017-07-11: 80 mg via INTRAMUSCULAR

## 2017-07-11 NOTE — Progress Notes (Addendum)
HPI male former smoker followed for allergic rhinitis, asthma,  history OSA, asbestos exposure/plaques, chronic insomnia Office spirometry 05/03/15- WNL- FVC 3.33/ 78%, FEV1 2.49/ 78%, r 0.75, FEF25-75% 1.92/ 69%.. -----------------------------------------------------------------------------------------------  12/13/2016-82 year old male former smoker followed for allergic rhinitis, asthma,  history OSA/ weight loss, asbestos exposure/plaques, chronic insomnia FOLLOWS FOR: Pt states he is doing well with his breathing; gets slight congestion with weather changes. Pt continues to swim. He says he is still swimming 1.5 miles, 3 days per [facility] Not trying to swim like the pool underwater anymore because I told her not to. No change in his pulmonary status. Denies chest pain, routine cough or phlegm. We discussed CXR tracking for his asbestos exposure. He still complains about insomnia, stating he averages 2 or 3 hours of sleep per day. He is using Ambien plus lorazepam. Benadryl carry over too long in his system. We discussed sleep habits. Wife does not report obvious breathing or movement disorder.TFT is checked by PCP.  CXR 05/03/16 IMPRESSION: 1. Asbestos related changes of calcified pleural plaques and calcified hemidiaphragms. 2. No active lung disease.  07/11/17- 83 year old male former smoker followed for allergic rhinitis, asthma,  history OSA/ weight loss, asbestos exposure/plaques, chronic insomnia Chest congestion for the past few weeks. Productive cough with clear mucus.    Here with his wife today Still swimming. Complains of acute illness-chest congestion for 3 weeks, cough productive scant white. Denies fever, chills or sore throat. He saw his primary physician September 6 treated with cefuroxime x 2 weeks. Then given Z-Pak. Had syncopal episode September 15-[place] [facility] with CXR and head CT. Remote history of acoustic neuroma therapy. Saw Dr Pleas Koch September 17 given Tessalon  which did not help, steroid shot. Now on Mucinex. By his home oximeter saturations running 95-96%. He has been swimming and asks permission to continue. Does get a little chilled as he finishes and dries off. CXR 06/29/17 IMPRESSION: 1. No acute findings. 2.  Aortic Atherosclerosis (ICD10-I70.0). 3. Asbestos related pleural disease identified.   ROS See HPI  + = positive Constitutional:   No-   weight loss, night sweats, fevers, chills, fatigue, lassitude. HEENT:   + headaches,  No -difficulty swallowing, tooth/dental problems, sore throat,       No-  sneezing, itching, ear ache,  nasal congestion, post nasal drip,  CV:  No-   chest pain, orthopnea, PND, swelling in lower extremities, no-anasarca, dizziness, palpitations Resp: No-   shortness of breath with exertion or at rest.              +  productive cough,  No non-productive cough,  No-  coughing up of blood.              No-   change in color of mucus.  No- wheezing.   Skin: No-   rash or lesions. GI:  No-   heartburn, indigestion, abdominal pain, nausea, vomiting,             GU:  MS:  + joint pain or swelling. .  + back pain. Neuro-  nothing unusual except per  HPI Psych:  No- change in mood or affect. No depression or anxiety.  No memory loss.  OBJ  General- Alert, Oriented, Affect-appropriate, Distress- none acute, + overweight, +talkative  Skin- rash-none, lesions- none, excoriation- none,  Lymphadenopathy- none Head- atraumatic            Eyes- Gross vision intact, PERRLA, conjunctivae clear secretions  Ears- +hard of hearing/hearing aid            Nose- Clear, no-Septal dev, mucus, polyps, erosion, perforation             Throat- Mallampati II , mucosa clear , drainage- none, tonsils- atrophic. Dental repair. Neck- flexible , trachea midline, no stridor , thyroid nl, carotid no bruit Chest - symmetrical excursion , unlabored           Heart/CV- slow RRR , no murmur , no gallop  , no rub, nl s1 s2                            - JVD- none , edema + chronic right lower leg, stasis changes- none, varices- none           Lung- clear to P&A, wheeze- none, cough + rattling , dullness-none, rub- none. +I cannot hear fine crackles or rub.           Chest wall-  Abd-  Br/ Gen/ Rectal- Not done, not indicated Extrem- +edema and erythema right calf/ankle/ sockline Neuro- grossly intact to observation. Speech is clear.

## 2017-07-11 NOTE — Patient Instructions (Addendum)
Depo 82    Dx   Exacerbation  Asthma  Flu vax senior  Ok to use the Mucinex  Sample Trelegy Ellipta inhaler     Inhale 1 puff, then rinse mouth, once daily  Please call as needed

## 2017-07-13 NOTE — Assessment & Plan Note (Signed)
He has pleural plaques. No obvious progressive process. He does not describe chest wall pain or blood. CT can be done if necessary, but not indicated at this time for current illness.

## 2017-07-13 NOTE — Assessment & Plan Note (Signed)
He describes exacerbation beginning consistent with a viral URI/bronchitis. He has had antibiotics and is slowly improving with no fever or purulent sputum. Recent CXR unrevealing. We will treat this as an acute bronchitis. Plan-sample Trelegy inhaler with discussion. Depo-Medrol. Xopenex nebulizer treatment. Flu vaccine senior. Okay to continue Mucinex.

## 2017-07-16 ENCOUNTER — Ambulatory Visit: Payer: Medicare Other | Admitting: Internal Medicine

## 2017-07-19 DIAGNOSIS — H11823 Conjunctivochalasis, bilateral: Secondary | ICD-10-CM | POA: Diagnosis not present

## 2017-07-19 DIAGNOSIS — S0011XA Contusion of right eyelid and periocular area, initial encounter: Secondary | ICD-10-CM | POA: Diagnosis not present

## 2017-07-19 DIAGNOSIS — H11153 Pinguecula, bilateral: Secondary | ICD-10-CM | POA: Diagnosis not present

## 2017-07-19 DIAGNOSIS — H16421 Pannus (corneal), right eye: Secondary | ICD-10-CM | POA: Diagnosis not present

## 2017-07-23 DIAGNOSIS — E876 Hypokalemia: Secondary | ICD-10-CM | POA: Diagnosis not present

## 2017-07-23 DIAGNOSIS — E871 Hypo-osmolality and hyponatremia: Secondary | ICD-10-CM | POA: Diagnosis not present

## 2017-07-23 DIAGNOSIS — I10 Essential (primary) hypertension: Secondary | ICD-10-CM | POA: Diagnosis not present

## 2017-07-23 DIAGNOSIS — J302 Other seasonal allergic rhinitis: Secondary | ICD-10-CM | POA: Diagnosis not present

## 2017-07-23 DIAGNOSIS — Z6834 Body mass index (BMI) 34.0-34.9, adult: Secondary | ICD-10-CM | POA: Diagnosis not present

## 2017-07-23 DIAGNOSIS — R600 Localized edema: Secondary | ICD-10-CM | POA: Diagnosis not present

## 2017-07-25 DIAGNOSIS — S0011XD Contusion of right eyelid and periocular area, subsequent encounter: Secondary | ICD-10-CM | POA: Diagnosis not present

## 2017-07-25 DIAGNOSIS — H11153 Pinguecula, bilateral: Secondary | ICD-10-CM | POA: Diagnosis not present

## 2017-07-25 DIAGNOSIS — H16421 Pannus (corneal), right eye: Secondary | ICD-10-CM | POA: Diagnosis not present

## 2017-07-25 DIAGNOSIS — H11823 Conjunctivochalasis, bilateral: Secondary | ICD-10-CM | POA: Diagnosis not present

## 2017-08-13 ENCOUNTER — Ambulatory Visit (INDEPENDENT_AMBULATORY_CARE_PROVIDER_SITE_OTHER): Payer: Medicare Other | Admitting: Internal Medicine

## 2017-08-13 ENCOUNTER — Encounter: Payer: Self-pay | Admitting: Internal Medicine

## 2017-08-13 VITALS — BP 190/82 | HR 69 | Temp 97.0°F | Ht 69.0 in | Wt 228.4 lb

## 2017-08-13 DIAGNOSIS — J61 Pneumoconiosis due to asbestos and other mineral fibers: Secondary | ICD-10-CM | POA: Diagnosis not present

## 2017-08-13 DIAGNOSIS — K648 Other hemorrhoids: Secondary | ICD-10-CM

## 2017-08-13 NOTE — Patient Instructions (Signed)
Avoid straining.  Continue Benefiber and MiraLAX  Daily, taking the MiraLAX as needed.  Limit toilet time to 5 minutes  Call with any interim problems  Schedule followup appointment in 6 months.

## 2017-08-13 NOTE — Progress Notes (Signed)
[place] banding procedure note:  The patient presents with symptomatic hemorrhoids;  Excellent response to prior 2 bandings. All of his hemorrhoids symptoms have resolved;  he really likes taking Benefiber and MiraLAX. He understands that the right posterior column has not been banded; he wants to have that banded today to wrap up the banding sessions.   All risks, benefits, and alternative forms of therapy were described and informed consent was obtained.  In the left lateral decubitus position, a DRE utilizing topical nitroglycerin Xylocaine used as lubricant reveal no abnormalities.  The decision was made to band the right posterior internal hemorrhoid;  The [place] was used to perform band ligation without complication. Digital anorectal examination was then performed to assure proper positioning of the band;  Band found to be in excellent position. No pinching or pain. The patient was discharged home without pain or other issues. Dietary and behavioral recommendations were given.  No complications were encountered and the patient tolerated the procedure well.  Office visit with [place] in 6 months.

## 2017-08-14 DIAGNOSIS — J61 Pneumoconiosis due to asbestos and other mineral fibers: Secondary | ICD-10-CM | POA: Insufficient documentation

## 2017-09-26 ENCOUNTER — Encounter: Payer: Self-pay | Admitting: Internal Medicine

## 2017-09-26 ENCOUNTER — Telehealth: Payer: Self-pay | Admitting: Internal Medicine

## 2017-09-26 NOTE — Telephone Encounter (Signed)
Called and spoke with pt and he requested that this letter be mailed to him.  This has been done and nothing further is needed.

## 2017-09-26 NOTE — Telephone Encounter (Signed)
Pt returned call, CB is [phone].

## 2017-09-26 NOTE — Telephone Encounter (Signed)
Patient states he needs this done as soon as possible.

## 2017-09-26 NOTE — Telephone Encounter (Signed)
Called and spoke with pt and he stated that he is trying to enroll in a new insurance and they keep kicking him back out of it due to a dx of COPD that he says is on his medical record, but he stated that [facility] has told him before that he does not have COPD>  He is wanting a letter from [facility] ASAP to turn in to prove that he does not have COPD.  CY please advise. Thanks  Allergies  Allergen Reactions  . Codeine Other (See Comments)    REACTION: groggy  . Tape Other (See Comments)    Skin tears, use paper tape   Last ov--07/11/2017 Next ov--12/17/2017

## 2017-09-26 NOTE — Telephone Encounter (Signed)
lmtcb for pt.  

## 2017-10-19 ENCOUNTER — Encounter (HOSPITAL_COMMUNITY): Payer: Self-pay

## 2017-10-19 ENCOUNTER — Other Ambulatory Visit: Payer: Self-pay

## 2017-10-19 ENCOUNTER — Emergency Department (HOSPITAL_COMMUNITY): Payer: Medicare Other

## 2017-10-19 ENCOUNTER — Emergency Department (HOSPITAL_COMMUNITY)
Admission: EM | Admit: 2017-10-19 | Discharge: 2017-10-19 | Disposition: A | Discharge status: Home / Self Care | Payer: Medicare Other | Source: Home / Self Care | Attending: Emergency Medicine | Admitting: Emergency Medicine

## 2017-10-19 DIAGNOSIS — M791 Myalgia, unspecified site: Secondary | ICD-10-CM | POA: Insufficient documentation

## 2017-10-19 DIAGNOSIS — J90 Pleural effusion, not elsewhere classified: Secondary | ICD-10-CM | POA: Diagnosis not present

## 2017-10-19 DIAGNOSIS — G4733 Obstructive sleep apnea (adult) (pediatric): Secondary | ICD-10-CM | POA: Diagnosis not present

## 2017-10-19 DIAGNOSIS — J4520 Mild intermittent asthma, uncomplicated: Secondary | ICD-10-CM | POA: Diagnosis not present

## 2017-10-19 DIAGNOSIS — R600 Localized edema: Secondary | ICD-10-CM | POA: Diagnosis not present

## 2017-10-19 DIAGNOSIS — Z79899 Other long term (current) drug therapy: Secondary | ICD-10-CM

## 2017-10-19 DIAGNOSIS — R509 Fever, unspecified: Secondary | ICD-10-CM | POA: Diagnosis not present

## 2017-10-19 DIAGNOSIS — L03115 Cellulitis of right lower limb: Secondary | ICD-10-CM | POA: Diagnosis not present

## 2017-10-19 DIAGNOSIS — I10 Essential (primary) hypertension: Secondary | ICD-10-CM

## 2017-10-19 DIAGNOSIS — E871 Hypo-osmolality and hyponatremia: Secondary | ICD-10-CM | POA: Diagnosis not present

## 2017-10-19 DIAGNOSIS — J111 Influenza due to unidentified influenza virus with other respiratory manifestations: Secondary | ICD-10-CM

## 2017-10-19 DIAGNOSIS — R05 Cough: Secondary | ICD-10-CM | POA: Diagnosis not present

## 2017-10-19 DIAGNOSIS — I471 Supraventricular tachycardia: Secondary | ICD-10-CM | POA: Diagnosis not present

## 2017-10-19 DIAGNOSIS — Z87891 Personal history of nicotine dependence: Secondary | ICD-10-CM | POA: Insufficient documentation

## 2017-10-19 DIAGNOSIS — R69 Illness, unspecified: Principal | ICD-10-CM

## 2017-10-19 LAB — CBC WITH DIFFERENTIAL/PLATELET
Basophils Absolute: 0 10*3/uL (ref 0.0–0.1)
Basophils Relative: 0 %
EOS ABS: 0.2 10*3/uL (ref 0.0–0.7)
Eosinophils Relative: 1 %
HEMATOCRIT: 38.8 % — AB (ref 39.0–52.0)
HEMOGLOBIN: 13 g/dL (ref 13.0–17.0)
LYMPHS ABS: 1.8 10*3/uL (ref 0.7–4.0)
LYMPHS PCT: 9 %
MCH: 30.4 pg (ref 26.0–34.0)
MCHC: 33.5 g/dL (ref 30.0–36.0)
MCV: 90.9 fL (ref 78.0–100.0)
Monocytes Absolute: 0 10*3/uL — ABNORMAL LOW (ref 0.1–1.0)
Monocytes Relative: 0 %
NEUTROS ABS: 18.5 10*3/uL — AB (ref 1.7–7.7)
NEUTROS PCT: 90 %
Platelets: 396 10*3/uL (ref 150–400)
RBC: 4.27 MIL/uL (ref 4.22–5.81)
RDW: 13.7 % (ref 11.5–15.5)
WBC: 20.4 10*3/uL — AB (ref 4.0–10.5)

## 2017-10-19 LAB — URINALYSIS, ROUTINE W REFLEX MICROSCOPIC
Bacteria, UA: NONE SEEN
Bilirubin Urine: NEGATIVE
Glucose, UA: NEGATIVE mg/dL
HGB URINE DIPSTICK: NEGATIVE
KETONES UR: NEGATIVE mg/dL
Nitrite: NEGATIVE
PROTEIN: NEGATIVE mg/dL
Specific Gravity, Urine: 1.012 (ref 1.005–1.030)
Squamous Epithelial / LPF: NONE SEEN
pH: 6 (ref 5.0–8.0)

## 2017-10-19 LAB — COMPREHENSIVE METABOLIC PANEL
ALT: 29 U/L (ref 17–63)
AST: 40 U/L (ref 15–41)
Albumin: 3.7 g/dL (ref 3.5–5.0)
Alkaline Phosphatase: 75 U/L (ref 38–126)
Anion gap: 12 (ref 5–15)
BUN: 13 mg/dL (ref 6–20)
CHLORIDE: 98 mmol/L — AB (ref 101–111)
CO2: 22 mmol/L (ref 22–32)
Calcium: 9 mg/dL (ref 8.9–10.3)
Creatinine, Ser: 0.92 mg/dL (ref 0.61–1.24)
GFR calc Af Amer: >60 mL/min (ref >60)
Glucose, Bld: 95 mg/dL (ref 65–99)
POTASSIUM: 3.7 mmol/L (ref 3.5–5.1)
Sodium: 132 mmol/L — ABNORMAL LOW (ref 135–145)
Total Bilirubin: 0.1 mg/dL — ABNORMAL LOW (ref 0.3–1.2)
Total Protein: 7.2 g/dL (ref 6.5–8.1)

## 2017-10-19 LAB — LACTIC ACID, PLASMA: Lactic Acid, Venous: 2.1 mmol/L (ref 0.5–1.9)

## 2017-10-19 LAB — INFLUENZA PANEL BY PCR (TYPE A & B)
INFLBPCR: NEGATIVE
Influenza A By PCR: NEGATIVE

## 2017-10-19 MED ORDER — ACETAMINOPHEN 325 MG PO TABS
650.0000 mg | ORAL_TABLET | Freq: Once | ORAL | Status: AC
Start: 2017-10-19 — End: 2017-10-19
  Administered 2017-10-19: 650 mg via ORAL
  Filled 2017-10-19: qty 2

## 2017-10-19 MED ORDER — OSELTAMIVIR PHOSPHATE 75 MG PO CAPS: 75.0000 mg | ORAL_CAPSULE | Freq: Two times a day (BID) | ORAL | 0 refills | Status: DC

## 2017-10-19 MED ORDER — OSELTAMIVIR PHOSPHATE 75 MG PO CAPS
75.0000 mg | ORAL_CAPSULE | Freq: Once | ORAL | Status: AC
  Administered 2017-10-19: 75 mg via ORAL
  Filled 2017-10-19: qty 1

## 2017-10-19 NOTE — ED Notes (Signed)
Patient transported to X-ray 

## 2017-10-19 NOTE — ED Provider Notes (Signed)
[facility] EMERGENCY DEPARTMENT Provider Note   CSN: [id number] Arrival date & time: 10/19/17  1420     History   Chief Complaint Chief Complaint  Patient presents with  . Fever    HPI Julian Fowler is a 83 y.o. male.  HPI Patient presents with chills, fever, myalgia and arthralgias starting today.  Has had mild cough and nasal congestion.  Denies nausea, vomiting or diarrhea.  No chest pain or abdominal pain.  No new rashes.  No known sick contacts.  Received flu shot this year. Past Medical History:  Diagnosis Date  . Abdominal aorta injury    Normal size, ultrasound, March 17,2011  . Allergic rhinitis   . Arthritis   . Asbestos exposure Jan 2001   Hx of asbestos related pleural plaques  . Balance problem   . BPH (benign prostatic hyperplasia)   . Complication of anesthesia    "trouble waking up"  . Easy bruisability   . ED (erectile dysfunction)   . Ejection fraction    60%, echo, 2008, mild RV dysfunction  . GERD (gastroesophageal reflux disease)   . H/O hiatal hernia   . Hearing loss in left ear   . History of shingles    "mild"  . Hyperlipidemia    under control  . Hypertension December 29, 2009   renal artery Doppler , normal  12/2009  . Overactive bladder   . Sleep apnea    was dx 10 years ago reports lost a lot of weight and no longer machine  . Tinnitus     Patient Active Problem List   Diagnosis Date Noted  . Asbestosis ([place]) 08/14/2017  . Rectal bleeding 01/24/2017  . Hemorrhoids 12/18/2016  . BRBPR (bright red blood per rectum) 12/18/2016  . 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 Peripheral edema 10/15/2013  . Hyponatremia 07/07/2013  . Hypokalemia 07/07/2013  . OA (osteoarthritis) of knee 07/06/2013  . Preop cardiovascular exam 06/26/2013  . Chronic insomnia 09/29/2012  . Cardiac murmur 08/27/2012  . Hearing impaired 04/10/2012  . Easy bruisability   . Anxiety   . Hyperlipidemia   . Abdominal aorta injury   . Asbestos exposure    . Overactive bladder   . ED (erectile dysfunction)   . SVT (supraventricular tachycardia) ([place])   . OSA (obstructive sleep apnea)   . Chest tightness   . Ejection fraction   . Essential hypertension 12/29/2009  . Syncope 09/14/2009  . TOBACCO ABUSE, HX OF 07/15/2009  . BACK PAIN 06/22/2009  . INTESTINAL GAS 06/22/2009  . 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 Seasonal and perennial allergic rhinitis 01/15/2008  . ECZEMA 11/18/2007  . Asthma, mild intermittent, well-controlled 11/03/2007  . GERD 04/14/2007  . ARTHRITIS 04/14/2007  . KNEE PAIN, LEFT 04/14/2007    Past Surgical History:  Procedure Laterality Date  . BACK SURGERY  2014   herniated L1, L2   Dr Carloyn Manner  . BRAIN SURGERY    . CEREBRAL EMBOLIZATION  12/2011   "radiation therapy-did not work"  . CHOLECYSTECTOMY  6/98  . COLONOSCOPY  09/2009   Dr. Lindalou Hose: normal, internal hemorrhoids   . COLONOSCOPY N/A 02/28/2017   Procedure: COLONOSCOPY;  Surgeon: [name], MD;  Location: [facility] SUITE;  Service: Endoscopy;  Laterality: N/A;  730   . CRANIECTOMY FOR EXCISION OF ACOUSTIC NEUROMA  3/95  . RADIOLOGY WITH ANESTHESIA N/A 07/07/2014   Procedure: EMBOLIZATION;  Surgeon: Rob Hickman, MD;  Location: [facility]  OR;  Service: Radiology;  Laterality: N/A;  . TOTAL KNEE ARTHROPLASTY Left 07/06/2013   Procedure: LEFT TOTAL KNEE ARTHROPLASTY;  Surgeon: Gearlean Alf, MD;  Location: [facility] ORS;  Service: Orthopedics;  Laterality: Left;  . TOTAL KNEE ARTHROPLASTY Right 01/18/2014   Procedure: RIGHT TOTAL KNEE ARTHROPLASTY;  Surgeon: Gearlean Alf, MD;  Location: [facility] ORS;  Service: Orthopedics;  Laterality: Right;  . TRIGGER FINGER RELEASE  2003   (thumb) middle finger (2006)       Home Medications    Prior to Admission medications   Medication Sig Start Date End Date Taking? Authorizing Provider  acetaminophen (TYLENOL) 325 MG tablet Take 325 mg by mouth every 6 (six) hours as needed for pain.    Yes  [provider]  Calcium Carb-Vit D-C-E-Mineral (OS-CAL ULTRA) 600 MG TABS Take 1 tablet by mouth daily.    Yes [provider]  CARTIA XT 120 MG 24 hr capsule TAKE ONE CAPSULE BY MOUTH TWICE DAILY 02/21/17  Yes Herminio Commons, MD  esomeprazole (NEXIUM) 40 MG capsule Take 40 mg by mouth every morning.    Yes [provider]  fluticasone (FLONASE) 50 MCG/ACT nasal spray Place 2 sprays into both nostrils daily. Patient taking differently: Place 2 sprays into both nostrils daily as needed for allergies.  05/03/16  Yes Young, Tarri Fuller D, MD  hydrALAZINE (APRESOLINE) 25 MG tablet Take 1 tablet (25 mg total) by mouth 3 (three) times daily. 01/31/17 10/19/17 Yes Herminio Commons, MD  hydrochlorothiazide (HYDRODIURIL) 25 MG tablet Take 25 mg by mouth daily.    [provider]  latanoprost (XALATAN) 0.005 % ophthalmic solution Place 1 drop into both eyes at bedtime.  09/01/12   [provider]  LORazepam (ATIVAN) 1 MG tablet TAKE ONE TO TWO TABLETS BY MOUTH AT BEDTIME AS NEEDED 02/05/17   Noralee Space, MD  losartan (COZAAR) 100 MG tablet Take 100 mg by mouth every morning.     [provider]  Multiple Vitamins-Minerals (CENTRUM SILVER PO) Take 1 tablet by mouth daily.     [provider]  oseltamivir (TAMIFLU) 75 MG capsule Take 1 capsule (75 mg total) by mouth every 12 (twelve) hours. 10/20/17   Julianne Rice, MD  polyethylene glycol [facility] / Floria Raveling) packet Take 17 g by mouth daily.    [provider]  potassium chloride SA (K-DUR,KLOR-CON) 20 MEQ tablet Take 1 tablet (20 mEq total) by mouth 2 (two) times daily. 06/29/17   Milton Ferguson, MD  pravastatin (PRAVACHOL) 40 MG tablet Take 1 tablet by mouth at bedtime.  10/05/14   [provider]  Wheat Dextrin (BENEFIBER) POWD Take by mouth 2 (two) times daily. Takes 1 Tbsp twice daily    [provider]  zolpidem (AMBIEN) 10 MG tablet Take 1 tablet by mouth at  bedtime.  01/24/16   [provider]    Family History Family History  Problem Relation Age of Onset  . Cancer Father        oral cancer  . Breast cancer Mother   . Heart attack Mother   . Hypertension Sister        Bypass x4  . Alcohol abuse Sister   . Alzheimer's disease Sister   . Kidney disease Sister   . Colon cancer Neg Hx     Social History Social History   Tobacco Use  . Smoking status: Former Smoker    Packs/day: 1.50    Years: 30.00  Pack years: 45.00    Types: Cigarettes    Start date: 04/24/1957    Last attempt to quit: 10/15/1986    Years since quitting: 31.0  . Smokeless tobacco: Never Used  Substance Use Topics  . Alcohol use: No    Alcohol/week: 0.0 oz    Comment: Used to drink heavily at times  . Drug use: No     Allergies   Codeine and Tape   Review of Systems Review of Systems  Constitutional: Positive for chills, fatigue and fever.  HENT: Positive for congestion. Negative for sinus pressure, sinus pain and sore throat.   Eyes: Negative for visual disturbance.  Respiratory: Positive for cough. Negative for chest tightness and shortness of breath.   Cardiovascular: Negative for chest pain, palpitations and leg swelling.  Gastrointestinal: Negative for abdominal pain, constipation, diarrhea and nausea.  Genitourinary: Negative for dysuria, flank pain and hematuria.  Musculoskeletal: Positive for arthralgias and myalgias. Negative for neck pain and neck stiffness.  Skin: Negative for rash and wound.  Neurological: Negative for dizziness, weakness, light-headedness, numbness and headaches.  All other systems reviewed and are negative.    Physical Exam Updated Vital Signs BP (!) 164/52 (BP Location: Right Arm)   Pulse 81   Temp 99.4 F (37.4 C) (Oral)   Resp (!) 25   Ht 5\' 9"  (1.753 m)   Wt 104.3 kg (230 lb)   SpO2 95%   BMI 33.97 kg/m   Physical Exam  Constitutional: He is oriented to person, place, and time. He appears  well-developed and well-nourished. No distress.  HENT:  Head: Normocephalic and atraumatic.  Mouth/Throat: Oropharynx is clear and moist. No oropharyngeal exudate.  Eyes: EOM are normal. Pupils are equal, round, and reactive to light.  Neck: Normal range of motion. Neck supple.  No meningismus. no cervical lymphadenopathy.  Cardiovascular: Normal rate and regular rhythm. Exam reveals no gallop and no friction rub.  No murmur heard. Pulmonary/Chest: Effort normal and breath sounds normal.  Abdominal: Soft. Bowel sounds are normal. There is no tenderness. There is no rebound and no guarding.  Musculoskeletal: Normal range of motion. He exhibits no edema or tenderness.  No lower extremity swelling, asymmetry or tenderness.  Neurological: He is alert and oriented to person, place, and time.  Moving all extremities without focal deficit.  Sensation fully intact.  Skin: Skin is warm and dry. Capillary refill takes less than 2 seconds. No rash noted. He is not diaphoretic. No erythema.  Psychiatric: He has a normal mood and affect. His behavior is normal.  Nursing note and vitals reviewed.    ED Treatments / Results  Labs (all labs ordered are listed, but only abnormal results are displayed) Labs Reviewed  COMPREHENSIVE METABOLIC PANEL - Abnormal; Notable for the following components:      Result Value   Sodium 132 (*)    Chloride 98 (*)    Total Bilirubin 0.1 (*)    All other components within normal limits  CBC WITH DIFFERENTIAL/PLATELET - Abnormal; Notable for the following components:   WBC 20.4 (*)    HCT 38.8 (*)    Neutro Abs 18.5 (*)    Monocytes Absolute 0.0 (*)    All other components within normal limits  URINALYSIS, ROUTINE W REFLEX MICROSCOPIC - Abnormal; Notable for the following components:   Leukocytes, UA TRACE (*)    All other components within normal limits  LACTIC ACID, PLASMA - Abnormal; Notable for the following components:   Lactic Acid, Venous  2.1 (*)    All  other components within normal limits  INFLUENZA PANEL BY PCR (TYPE A & B)    EKG  EKG Interpretation None       Radiology Dg Chest 2 View  Result Date: 10/19/2017 CLINICAL DATA:  Fever. EXAM: CHEST  2 VIEW COMPARISON:  06/29/2017 and prior radiograph FINDINGS: Upper limits normal heart size again noted. Pleural calcifications are again noted. There is no evidence of focal airspace disease, pulmonary edema, suspicious pulmonary nodule/mass, pleural effusion, or pneumothorax. No acute bony abnormalities are identified. IMPRESSION: No active cardiopulmonary disease. Electronically Signed   By: Margarette Canada M.D.   On: 10/19/2017 15:34    Procedures Procedures (including critical care time)  Medications Ordered in ED Medications  oseltamivir (TAMIFLU) capsule 75 mg (not administered)  acetaminophen (TYLENOL) tablet 650 mg (not administered)     Initial Impression / Assessment and Plan / ED Course  I have reviewed the triage vital signs and the nursing notes.  Pertinent labs & imaging results that were available during my care of the patient were reviewed by me and considered in my medical decision making (see chart for details).     Patient is very well-appearing.  He is in no respiratory distress.  No definite source for fever was found.  Likely flulike illness.  Will start on Tamiflu given only 1 day of symptoms.  Very strict return precautions have been given and patient has voiced understanding.  Final Clinical Impressions(s) / ED Diagnoses   Final diagnoses:  Influenza-like illness    ED Discharge Orders        Ordered    oseltamivir (TAMIFLU) 75 MG capsule  Every 12 hours     10/19/17 1638       Julianne Rice, MD 10/19/17 1652

## 2017-10-19 NOTE — ED Triage Notes (Signed)
Patient reports of fever that started today. Wife states patient was "shaking today" and took 2 Ativan.  Took Zicam, 2 tylenol and 2 (1mg ) Ativan.

## 2017-10-19 NOTE — ED Notes (Signed)
Lactic acid 2.1.  Lita Mains MD notified

## 2017-10-19 NOTE — ED Notes (Signed)
ED Provider at bedside. 

## 2017-10-21 ENCOUNTER — Emergency Department (HOSPITAL_COMMUNITY): Payer: Medicare Other

## 2017-10-21 ENCOUNTER — Inpatient Hospital Stay (HOSPITAL_COMMUNITY)
Admission: EM | Admit: 2017-10-21 | Discharge: 2017-10-30 | DRG: 603 | Disposition: A | Discharge status: Home / Self Care | Payer: Medicare Other | Attending: Internal Medicine | Admitting: Internal Medicine

## 2017-10-21 ENCOUNTER — Encounter (HOSPITAL_COMMUNITY): Payer: Self-pay | Admitting: Emergency Medicine

## 2017-10-21 DIAGNOSIS — I10 Essential (primary) hypertension: Secondary | ICD-10-CM | POA: Diagnosis not present

## 2017-10-21 DIAGNOSIS — G4700 Insomnia, unspecified: Secondary | ICD-10-CM | POA: Diagnosis present

## 2017-10-21 DIAGNOSIS — R600 Localized edema: Secondary | ICD-10-CM | POA: Diagnosis not present

## 2017-10-21 DIAGNOSIS — E871 Hypo-osmolality and hyponatremia: Secondary | ICD-10-CM | POA: Diagnosis present

## 2017-10-21 DIAGNOSIS — Z91048 Other nonmedicinal substance allergy status: Secondary | ICD-10-CM | POA: Diagnosis not present

## 2017-10-21 DIAGNOSIS — R509 Fever, unspecified: Secondary | ICD-10-CM | POA: Diagnosis not present

## 2017-10-21 DIAGNOSIS — Z79899 Other long term (current) drug therapy: Secondary | ICD-10-CM

## 2017-10-21 DIAGNOSIS — Z885 Allergy status to narcotic agent status: Secondary | ICD-10-CM | POA: Diagnosis not present

## 2017-10-21 DIAGNOSIS — J90 Pleural effusion, not elsewhere classified: Secondary | ICD-10-CM | POA: Diagnosis not present

## 2017-10-21 DIAGNOSIS — E785 Hyperlipidemia, unspecified: Secondary | ICD-10-CM | POA: Diagnosis present

## 2017-10-21 DIAGNOSIS — K219 Gastro-esophageal reflux disease without esophagitis: Secondary | ICD-10-CM | POA: Diagnosis present

## 2017-10-21 DIAGNOSIS — L03115 Cellulitis of right lower limb: Secondary | ICD-10-CM

## 2017-10-21 DIAGNOSIS — Z77090 Contact with and (suspected) exposure to asbestos: Secondary | ICD-10-CM | POA: Diagnosis present

## 2017-10-21 DIAGNOSIS — H409 Unspecified glaucoma: Secondary | ICD-10-CM | POA: Diagnosis present

## 2017-10-21 DIAGNOSIS — E869 Volume depletion, unspecified: Secondary | ICD-10-CM | POA: Diagnosis present

## 2017-10-21 DIAGNOSIS — R011 Cardiac murmur, unspecified: Secondary | ICD-10-CM | POA: Diagnosis present

## 2017-10-21 DIAGNOSIS — Z7951 Long term (current) use of inhaled steroids: Secondary | ICD-10-CM

## 2017-10-21 DIAGNOSIS — E861 Hypovolemia: Secondary | ICD-10-CM | POA: Diagnosis present

## 2017-10-21 DIAGNOSIS — G4733 Obstructive sleep apnea (adult) (pediatric): Secondary | ICD-10-CM | POA: Diagnosis not present

## 2017-10-21 DIAGNOSIS — L0390 Cellulitis, unspecified: Secondary | ICD-10-CM | POA: Diagnosis present

## 2017-10-21 DIAGNOSIS — K5900 Constipation, unspecified: Secondary | ICD-10-CM | POA: Diagnosis present

## 2017-10-21 DIAGNOSIS — J4520 Mild intermittent asthma, uncomplicated: Secondary | ICD-10-CM | POA: Diagnosis not present

## 2017-10-21 DIAGNOSIS — R05 Cough: Secondary | ICD-10-CM | POA: Diagnosis not present

## 2017-10-21 DIAGNOSIS — Z634 Disappearance and death of family member: Secondary | ICD-10-CM | POA: Diagnosis not present

## 2017-10-21 DIAGNOSIS — T502X5A Adverse effect of carbonic-anhydrase inhibitors, benzothiadiazides and other diuretics, initial encounter: Secondary | ICD-10-CM | POA: Diagnosis present

## 2017-10-21 DIAGNOSIS — Z96653 Presence of artificial knee joint, bilateral: Secondary | ICD-10-CM | POA: Diagnosis present

## 2017-10-21 DIAGNOSIS — I82402 Acute embolism and thrombosis of unspecified deep veins of left lower extremity: Secondary | ICD-10-CM | POA: Diagnosis not present

## 2017-10-21 DIAGNOSIS — I471 Supraventricular tachycardia: Secondary | ICD-10-CM | POA: Diagnosis present

## 2017-10-21 DIAGNOSIS — I671 Cerebral aneurysm, nonruptured: Secondary | ICD-10-CM | POA: Diagnosis not present

## 2017-10-21 LAB — CBC WITH DIFFERENTIAL/PLATELET
BASOS PCT: 0 %
Basophils Absolute: 0 10*3/uL (ref 0.0–0.1)
EOS ABS: 0 10*3/uL (ref 0.0–0.7)
Eosinophils Relative: 0 %
HEMATOCRIT: 39.6 % (ref 39.0–52.0)
Hemoglobin: 13.5 g/dL (ref 13.0–17.0)
Lymphocytes Relative: 4 %
Lymphs Abs: 0.8 10*3/uL (ref 0.7–4.0)
MCH: 30.8 pg (ref 26.0–34.0)
MCHC: 34.1 g/dL (ref 30.0–36.0)
MCV: 90.2 fL (ref 78.0–100.0)
Monocytes Absolute: 0.7 10*3/uL (ref 0.1–1.0)
Monocytes Relative: 4 %
NEUTROS ABS: 16.6 10*3/uL — AB (ref 1.7–7.7)
Neutrophils Relative %: 92 %
Platelets: 314 10*3/uL (ref 150–400)
RBC: 4.39 MIL/uL (ref 4.22–5.81)
RDW: 13.6 % (ref 11.5–15.5)
WBC: 18.2 10*3/uL — AB (ref 4.0–10.5)

## 2017-10-21 LAB — COMPREHENSIVE METABOLIC PANEL
ALT: 57 U/L (ref 17–63)
ANION GAP: 9 (ref 5–15)
AST: 37 U/L (ref 15–41)
Albumin: 3.4 g/dL — ABNORMAL LOW (ref 3.5–5.0)
Alkaline Phosphatase: 83 U/L (ref 38–126)
BUN: 16 mg/dL (ref 6–20)
CALCIUM: 9.4 mg/dL (ref 8.9–10.3)
CHLORIDE: 90 mmol/L — AB (ref 101–111)
CO2: 26 mmol/L (ref 22–32)
CREATININE: 0.99 mg/dL (ref 0.61–1.24)
Glucose, Bld: 94 mg/dL (ref 65–99)
Potassium: 3.9 mmol/L (ref 3.5–5.1)
SODIUM: 125 mmol/L — AB (ref 135–145)
Total Bilirubin: 0.3 mg/dL (ref 0.3–1.2)
Total Protein: 7.5 g/dL (ref 6.5–8.1)

## 2017-10-21 LAB — LACTIC ACID, PLASMA: Lactic Acid, Venous: 1 mmol/L (ref 0.5–1.9)

## 2017-10-21 MED ORDER — PANTOPRAZOLE SODIUM 40 MG PO TBEC
40.0000 mg | DELAYED_RELEASE_TABLET | Freq: Every day | ORAL | Status: DC
  Administered 2017-10-22 – 2017-10-30 (×9): 40 mg via ORAL
  Filled 2017-10-21 (×10): qty 1

## 2017-10-21 MED ORDER — POTASSIUM CHLORIDE CRYS ER 20 MEQ PO TBCR
20.0000 meq | EXTENDED_RELEASE_TABLET | Freq: Two times a day (BID) | ORAL | Status: DC
  Administered 2017-10-21 – 2017-10-22 (×2): 20 meq via ORAL
  Filled 2017-10-21 (×2): qty 1

## 2017-10-21 MED ORDER — LORAZEPAM 1 MG PO TABS
1.0000 mg | ORAL_TABLET | Freq: Every day | ORAL | Status: DC
  Administered 2017-10-21 – 2017-10-29 (×9): 1 mg via ORAL
  Filled 2017-10-21 (×9): qty 1

## 2017-10-21 MED ORDER — HYDRALAZINE HCL 20 MG/ML IJ SOLN: 5.0000 mg | INTRAMUSCULAR | Status: DC | PRN

## 2017-10-21 MED ORDER — HEPARIN SODIUM (PORCINE) 5000 UNIT/ML IJ SOLN
5000.0000 [IU] | Freq: Three times a day (TID) | INTRAMUSCULAR | Status: DC
  Administered 2017-10-21 – 2017-10-30 (×26): 5000 [IU] via SUBCUTANEOUS
  Filled 2017-10-21 (×26): qty 1

## 2017-10-21 MED ORDER — ACETAMINOPHEN 325 MG PO TABS
650.0000 mg | ORAL_TABLET | Freq: Four times a day (QID) | ORAL | Status: DC | PRN
  Administered 2017-10-22 – 2017-10-28 (×11): 650 mg via ORAL
  Filled 2017-10-21 (×10): qty 2

## 2017-10-21 MED ORDER — DILTIAZEM HCL ER COATED BEADS 120 MG PO CP24
120.0000 mg | ORAL_CAPSULE | Freq: Every day | ORAL | Status: DC
  Administered 2017-10-22 – 2017-10-30 (×9): 120 mg via ORAL
  Filled 2017-10-21 (×9): qty 1

## 2017-10-21 MED ORDER — DEXTROSE 5 % IV SOLN
2.0000 g | INTRAVENOUS | Status: DC
  Administered 2017-10-21: 2 g via INTRAVENOUS
  Filled 2017-10-21 (×2): qty 2

## 2017-10-21 MED ORDER — LOSARTAN POTASSIUM 25 MG PO TABS
100.0000 mg | ORAL_TABLET | Freq: Every morning | ORAL | Status: DC
  Administered 2017-10-22 – 2017-10-30 (×9): 100 mg via ORAL
  Filled 2017-10-21 (×9): qty 4

## 2017-10-21 MED ORDER — HYDRALAZINE HCL 25 MG PO TABS
25.0000 mg | ORAL_TABLET | Freq: Three times a day (TID) | ORAL | Status: DC
  Administered 2017-10-21 – 2017-10-30 (×26): 25 mg via ORAL
  Filled 2017-10-21 (×26): qty 1

## 2017-10-21 MED ORDER — SODIUM CHLORIDE 0.9 % IV SOLN
INTRAVENOUS | Status: DC
  Administered 2017-10-21 – 2017-10-22 (×2): via INTRAVENOUS

## 2017-10-21 MED ORDER — VANCOMYCIN HCL IN DEXTROSE 1-5 GM/200ML-% IV SOLN
1000.0000 mg | Freq: Once | INTRAVENOUS | Status: AC
  Administered 2017-10-21: 1000 mg via INTRAVENOUS
  Filled 2017-10-21: qty 200

## 2017-10-21 MED ORDER — FLUTICASONE PROPIONATE 50 MCG/ACT NA SUSP: 2.0000 | Freq: Every day | NASAL | Status: DC | PRN

## 2017-10-21 MED ORDER — PRAVASTATIN SODIUM 40 MG PO TABS
40.0000 mg | ORAL_TABLET | Freq: Every day | ORAL | Status: DC
  Administered 2017-10-21 – 2017-10-29 (×9): 40 mg via ORAL
  Filled 2017-10-21 (×8): qty 1

## 2017-10-21 MED ORDER — SODIUM CHLORIDE 0.9 % IV BOLUS (SEPSIS)
1000.0000 mL | Freq: Once | INTRAVENOUS | Status: AC
  Administered 2017-10-21: 1000 mL via INTRAVENOUS

## 2017-10-21 NOTE — H&P (Signed)
History and Physical    Julian Fowler KNL:[id number] DOB: 1934-12-09 DOA: 10/21/2017  PCP: [name], MD   Patient coming from: home  Chief Complaint: Swelling Right leg, fever  HPI: Julian Fowler is a 83 y.o. male with medical history significant for asthma, hypertension, abdominal aortic injury, SVT, OSA, asbestosis.  Patient presented to the ED today with complaints of right leg swelling and redness that started on Saturday evening -2 days ago, after he to the ED that day.  Swelling and redness started in his toes then, extended up towards his knees and then groin patient was in the ED 10/19/16, for fever, body aches, leukocytosis , flu check was negative, thought to be viral etiology, was discharged home on Tamiflu.  Since Saturday patient's wife has recorded fevers of 100F, highest recorded was prior to ED visit 2 days ago, 102.  Patient denies pain in his right leg, no purulence or drainage, endorses mild intermittent extremity swelling involving only his left leg.  Patient denies trauma to his leg, no ulcers or wounds, but thinks he might have bumped his right ultrasound negative for DVT 2-3 days ago.   ED Course: BP mildly elevated 158/85 otherwise unremarkable vitals, WBC elevated at 18, sodium low 125,.  Right lower extremity ultrasound negative for DVT.  Lactic acid 1.  Patient was started on IV vancomycin hospitalist was called to admit for cellulitis  Review of Systems: Patient reports loose stools 2 times a day for 2 days that is resolving once today , otherwise  10 point review of systems negative.   Past Medical History:  Diagnosis Date  . Abdominal aorta injury    Normal size, ultrasound, March 17,2011  . Allergic rhinitis   . Arthritis   . Asbestos exposure Jan 2001   Hx of asbestos related pleural plaques  . Balance problem   . BPH (benign prostatic hyperplasia)   . Complication of anesthesia    "trouble waking up"  . Easy bruisability   . ED (erectile  dysfunction)   . Ejection fraction    60%, echo, 2008, mild RV dysfunction  . GERD (gastroesophageal reflux disease)   . H/O hiatal hernia   . Hearing loss in left ear   . History of shingles    "mild"  . Hyperlipidemia    under control  . Hypertension December 29, 2009   renal artery Doppler , normal  12/2009  . Overactive bladder   . Sleep apnea    was dx 10 years ago reports lost a lot of weight and no longer machine  . Tinnitus     Past Surgical History:  Procedure Laterality Date  . BACK SURGERY  2014   herniated L1, L2   Dr Carloyn Manner  . BRAIN SURGERY    . CEREBRAL EMBOLIZATION  12/2011   "radiation therapy-did not work"  . CHOLECYSTECTOMY  6/98  . COLONOSCOPY  09/2009   Dr. Lindalou Hose: normal, internal hemorrhoids   . COLONOSCOPY N/A 02/28/2017   Procedure: COLONOSCOPY;  Surgeon: [name], MD;  Location: [facility];  Service: Endoscopy;  Laterality: N/A;  730   . CRANIECTOMY FOR EXCISION OF ACOUSTIC NEUROMA  3/95  . RADIOLOGY WITH ANESTHESIA N/A 07/07/2014   Procedure: EMBOLIZATION;  Surgeon: Rob Hickman, MD;  Location: [facility];  Service: Radiology;  Laterality: N/A;  . TOTAL KNEE ARTHROPLASTY Left 07/06/2013   Procedure: LEFT TOTAL KNEE ARTHROPLASTY;  Surgeon: Gearlean Alf, MD;  Location: [facility];  Service:  Orthopedics;  Laterality: Left;  . TOTAL KNEE ARTHROPLASTY Right 01/18/2014   Procedure: RIGHT TOTAL KNEE ARTHROPLASTY;  Surgeon: Gearlean Alf, MD;  Location: [facility];  Service: Orthopedics;  Laterality: Right;  . TRIGGER FINGER RELEASE  2003   (thumb) middle finger (2006)     reports that he quit smoking about 31 years ago. His smoking use included cigarettes. He started smoking about 60 years ago. He has a 45.00 pack-year smoking history. he has never used smokeless tobacco. He reports that he does not drink alcohol or use drugs.  Allergies  Allergen Reactions  . Codeine Other (See Comments)    REACTION: groggy  . Tape Other (See Comments)    Skin  tears, use paper tape    Family History  Problem Relation Age of Onset  . Cancer Father        oral cancer  . Breast cancer Mother   . Heart attack Mother   . Hypertension Sister        Bypass x4  . Alcohol abuse Sister   . Alzheimer's disease Sister   . Kidney disease Sister   . Colon cancer Neg Hx     Prior to Admission medications   Medication Sig Start Date End Date Taking? Authorizing Provider  acetaminophen (TYLENOL) 325 MG tablet Take 325 mg by mouth every 6 (six) hours as needed for pain.    Yes [provider]  Calcium Carb-Vit D-C-E-Mineral (OS-CAL ULTRA) 600 MG TABS Take 1 tablet by mouth daily.    Yes [provider]  CARTIA XT 120 MG 24 hr capsule TAKE ONE CAPSULE BY MOUTH TWICE DAILY 02/21/17  Yes Herminio Commons, MD  esomeprazole (NEXIUM) 40 MG capsule Take 40 mg by mouth every morning.    Yes [provider]  fluticasone (FLONASE) 50 MCG/ACT nasal spray Place 2 sprays into both nostrils daily. Patient taking differently: Place 2 sprays into both nostrils daily as needed for allergies.  05/03/16  Yes Young, Tarri Fuller D, MD  hydrALAZINE (APRESOLINE) 25 MG tablet Take 1 tablet (25 mg total) by mouth 3 (three) times daily. 01/31/17 10/21/17 Yes Herminio Commons, MD  hydrochlorothiazide (HYDRODIURIL) 25 MG tablet Take 25 mg by mouth daily.   Yes [provider]  latanoprost (XALATAN) 0.005 % ophthalmic solution Place 1 drop into both eyes at bedtime.  09/01/12  Yes [provider]  LORazepam (ATIVAN) 1 MG tablet TAKE ONE TO TWO TABLETS BY MOUTH AT BEDTIME AS NEEDED 02/05/17  Yes Noralee Space, MD  losartan (COZAAR) 100 MG tablet Take 100 mg by mouth every morning.    Yes [provider]  Multiple Vitamins-Minerals (CENTRUM SILVER PO) Take 1 tablet by mouth daily.    Yes [provider]  oseltamivir (TAMIFLU) 75 MG capsule Take 1 capsule (75 mg total) by mouth every 12 (twelve) hours. 10/20/17  Yes Julianne Rice, MD  polyethylene glycol [facility] / Floria Raveling) packet Take 17 g by mouth daily.   Yes [provider]  potassium chloride SA (K-DUR,KLOR-CON) 20 MEQ tablet Take 1 tablet (20 mEq total) by mouth 2 (two) times daily. 06/29/17  Yes Milton Ferguson, MD  pravastatin (PRAVACHOL) 40 MG tablet Take 1 tablet by mouth at bedtime.  10/05/14  Yes [provider]  Wheat Dextrin (BENEFIBER) POWD Take by mouth 2 (two) times daily. Takes 1 Tbsp twice daily   Yes [provider]  zolpidem (AMBIEN) 10 MG tablet Take 1 tablet by mouth at bedtime.  01/24/16  Yes [provider]    Physical Exam: Vitals:   10/21/17 1600 10/21/17 1615 10/21/17 1630 10/21/17 1645  BP: (!) 157/81  (!) 154/77   Pulse: 65 64 68 69  Resp:      Temp:      TempSrc:      SpO2: 99% 100% 98% 98%  Weight:      Height:        Constitutional: NAD, calm, comfortable, hard of hearing, hearing aids Vitals:   10/21/17 1600 10/21/17 1615 10/21/17 1630 10/21/17 1645  BP: (!) 157/81  (!) 154/77   Pulse: 65 64 68 69  Resp:      Temp:      TempSrc:      SpO2: 99% 100% 98% 98%  Weight:      Height:       Eyes: PERRL, lids and conjunctivae normal ENMT: Mucous membranes are moist. Posterior pharynx clear of any exudate or lesions. Neck: normal, supple, no masses, no thyromegaly Respiratory: clear to auscultation bilaterally, no wheezing, no crackles. Normal respiratory effort. No accessory muscle use.  Cardiovascular: 3/6 systolic murmur , appears this is not new, regular rate and rhythm,  Abdomen: no tenderness, no masses palpated. No hepatosplenomegaly. Bowel sounds positive.  Musculoskeletal: no clubbing / cyanosis. No joint deformity upper and lower extremities. Good ROM, no contractures. Normal muscle tone.  Skin: right LE-significant erythema and swelling extending from toes towards his knees, with minimal if any tenderness, warmth present, sensation intact, no paresthesias, DP, PT, pulse  difficult to appreciate due to edema and habitus, no open wounds ulcers or drainage, streaking from the knees towards groin  Neurologic: CN 2-12 grossly intact. Sensation intact, Strength 5/5 in all 4.  Psychiatric: Normal judgment and insight. Alert and oriented x 3. Normal mood.   Labs on Admission: I have personally reviewed following labs and imaging studies  CBC: Recent Labs  Lab 10/19/17 1453 10/21/17 1314  WBC 20.4* 18.2*  NEUTROABS 18.5* 16.6*  HGB 13.0 13.5  HCT 38.8* 39.6  MCV 90.9 90.2  PLT 396 825   Basic Metabolic Panel: Recent Labs  Lab 10/19/17 1453 10/21/17 1314  NA 132* 125*  K 3.7 3.9  CL 98* 90*  CO2 22 26  GLUCOSE 95 94  BUN 13 16  CREATININE 0.92 0.99  CALCIUM 9.0 9.4   Liver Function Tests: Recent Labs  Lab 10/19/17 1453 10/21/17 1314  AST 40 37  ALT 29 57  ALKPHOS 75 83  BILITOT 0.1* 0.3  PROT 7.2 7.5  ALBUMIN 3.7 3.4*   Urine analysis:    Component Value Date/Time   COLORURINE YELLOW 10/19/2017 1510   APPEARANCEUR CLEAR 10/19/2017 1510   LABSPEC 1.012 10/19/2017 1510   PHURINE 6.0 10/19/2017 1510   GLUCOSEU NEGATIVE 10/19/2017 1510   HGBUR NEGATIVE 10/19/2017 1510   HGBUR negative 10/27/2008 0838   BILIRUBINUR NEGATIVE 10/19/2017 1510   KETONESUR NEGATIVE 10/19/2017 1510   PROTEINUR NEGATIVE 10/19/2017 1510   UROBILINOGEN 0.2 01/11/2014 1405   NITRITE NEGATIVE 10/19/2017 1510   LEUKOCYTESUR TRACE (A) 10/19/2017 1510    Radiological Exams on Admission: Dg Chest 2 View  Result Date: 10/21/2017 CLINICAL DATA:  Infection, right leg swelling and redness. EXAM: CHEST  2 VIEW COMPARISON:  10/19/2017. FINDINGS: Trachea is midline. Heart size normal. Thoracic aorta is calcified. There are calcified pleural plaques bilaterally. No superimposed airspace consolidation or pleural fluid. IMPRESSION: 1. No acute findings. 2. Asbestos related pleural disease. 3.  Aortic atherosclerosis (ICD10-170.0).  Electronically Signed   By: Lorin Picket  M.D.   On: 10/21/2017 12:41   US Venous Img Lower Unilateral Right  Result Date: 10/21/2017 CLINICAL DATA:  Right lower extremity pain and redness and edema. EXAM: RIGHT LOWER EXTREMITY VENOUS DOPPLER ULTRASOUND TECHNIQUE: Gray-scale sonography with graded compression, as well as color Doppler and duplex ultrasound were performed to evaluate the lower extremity deep venous systems from the level of the common femoral vein and including the common femoral, femoral, profunda femoral, popliteal and calf veins including the posterior tibial, peroneal and gastrocnemius veins when visible. The superficial great saphenous vein was also interrogated. Spectral Doppler was utilized to evaluate flow at rest and with distal augmentation maneuvers in the common femoral, femoral and popliteal veins. COMPARISON:  None. FINDINGS: Contralateral Common Femoral Vein: Respiratory phasicity is normal and symmetric with the symptomatic side. No evidence of thrombus. Normal compressibility. Common Femoral Vein: No evidence of thrombus. Normal compressibility, respiratory phasicity and response to augmentation. Saphenofemoral Junction: No evidence of thrombus. Normal compressibility and flow on color Doppler imaging. Profunda Femoral Vein: No evidence of thrombus. Normal compressibility and flow on color Doppler imaging. Femoral Vein: No evidence of thrombus. Normal compressibility, respiratory phasicity and response to augmentation. Popliteal Vein: No evidence of thrombus. Normal compressibility, respiratory phasicity and response to augmentation. Calf Veins: Visualized right deep calf veins are patent without thrombus. Other Findings:  Subcutaneous edema in the right calf. IMPRESSION: Negative for deep venous thrombosis in right lower extremity. Electronically Signed   By: Markus Daft M.D.   On: 10/21/2017 14:37    EKG: None  Assessment/Plan Principal Problem:   Cellulitis Active Problems:   Asthma, mild intermittent,  well-controlled   Essential hypertension   OSA (obstructive sleep apnea)   Cardiac murmur  Cellulitis-redness swelling, fever, no significant pain.  WBC elevated 18.  Lactic acid 1.  Did not meets sepsis criteria on admission.  Patient not ill-appearing.  No wounds ulcers or purulence, no trauma history, not diabetic .  Most likely streptococcus specie causative organism.  IV Vanco started in ED. RLE [place]- Neg for DVT. -We will switch to IV ceftriaxone 2 g daily -Follow-up blood cultures drawn in ED - delineate cellulitic area with skin pen  Hyponatremia- 125.  Baseline 129- 132. Likely 2/2 HCTZ,, with 2 days of diarrhea contributing now resolved. 1L  bolus given in ED. -Hydrate Ns 100cc/hr x  1 day - BMP am  HTN-elevated.  Home medication HCTZ, hydralazine, losartan, Cardizem. -Hold HCTZ while hydrating consider other options for elevated BP, increasing hydralazine dose - PRN Hydralazine for now  Insomnia-patient takes 1 mg of Ativan daily at bedside -Continue Ativan while inpatient -Hold zolpidem  Cardiac murmur-Echo done 2013 for cardiac murmur negative for valve abnormality  DVT prophylaxis: heparin  Code Status: Full  Family Communication: Spouse and daughter at bedside. Disposition Plan: 2-3 days.  Consults called: None  Admission status: Inpt, tele    Bethena Roys MD [facility] Pager [phone]  If 7PM-7AM, please contact night-coverage [email or web address] Password [facility]  10/21/2017, 5:44 PM

## 2017-10-21 NOTE — ED Triage Notes (Signed)
Pt reports right leg swelling and redness from groin since Saturday.  Seen on Saturday for fever, but leg did not look like this per wife.  Was dx with flulike illness and started on Tamiflu.

## 2017-10-21 NOTE — ED Notes (Signed)
Urinal provided for pt.

## 2017-10-21 NOTE — ED Provider Notes (Signed)
Emergency Department Provider Note   I have reviewed the triage vital signs and the nursing notes.   HISTORY  Chief Complaint Leg Swelling (red and hot right leg)   HPI Julian Fowler is a 83 y.o. male With multiple medical problems as documented below the presents to the emergency department designated 2-3 days of progressively worsening leg swelling.  States he was seen here on Saturday secondary to arthralgias, myalgias fever and chills.  Had a little bit of sore throat and congestion and cough but a negative flu test.  Was sent home with strict return precautions never since that time he had progressively worsening right lower extremity swelling.  States it started as swelling and then progressively started having some paresthesias and then redness and then worsening pain and is progressively worsened until today when he came here to be seen.  He was in the waiting room for a few hours and he states that he got significantly worse even while he was out there.  Still having some fever and chills and arthralgias but still no respiratory symptoms.  Never anything like this before. No other associated or modifying symptoms.    Past Medical History:  Diagnosis Date  . Abdominal aorta injury    Normal size, ultrasound, March 17,2011  . Allergic rhinitis   . Arthritis   . Asbestos exposure Jan 2001   Hx of asbestos related pleural plaques  . Balance problem   . BPH (benign prostatic hyperplasia)   . Complication of anesthesia    "trouble waking up"  . Easy bruisability   . ED (erectile dysfunction)   . Ejection fraction    60%, echo, 2008, mild RV dysfunction  . GERD (gastroesophageal reflux disease)   . H/O hiatal hernia   . Hearing loss in left ear   . History of shingles    "mild"  . Hyperlipidemia    under control  . Hypertension December 29, 2009   renal artery Doppler , normal  12/2009  . Overactive bladder   . Sleep apnea    was dx 10 years ago reports lost a lot of  weight and no longer machine  . Tinnitus     Patient Active Problem List   Diagnosis Date Noted  . Asbestosis ([place]) 08/14/2017  . Rectal bleeding 01/24/2017  . Hemorrhoids 12/18/2016  . BRBPR (bright red blood per rectum) 12/18/2016  . 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 Peripheral edema 10/15/2013  . Hyponatremia 07/07/2013  . Hypokalemia 07/07/2013  . OA (osteoarthritis) of knee 07/06/2013  . Preop cardiovascular exam 06/26/2013  . Chronic insomnia 09/29/2012  . Cardiac murmur 08/27/2012  . Hearing impaired 04/10/2012  . Easy bruisability   . Anxiety   . Hyperlipidemia   . Abdominal aorta injury   . Asbestos exposure   . Overactive bladder   . ED (erectile dysfunction)   . SVT (supraventricular tachycardia) ([place])   . OSA (obstructive sleep apnea)   . Chest tightness   . Ejection fraction   . Essential hypertension 12/29/2009  . Syncope 09/14/2009  . TOBACCO ABUSE, HX OF 07/15/2009  . BACK PAIN 06/22/2009  . INTESTINAL GAS 06/22/2009  . 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 Seasonal and perennial allergic rhinitis 01/15/2008  . ECZEMA 11/18/2007  . Asthma, mild intermittent, well-controlled 11/03/2007  . GERD 04/14/2007  . ARTHRITIS 04/14/2007  . KNEE PAIN, LEFT 04/14/2007    Past Surgical History:  Procedure Laterality Date  . BACK  SURGERY  2014   herniated L1, L2   Dr Carloyn Manner  . BRAIN SURGERY    . CEREBRAL EMBOLIZATION  12/2011   "radiation therapy-did not work"  . CHOLECYSTECTOMY  6/98  . COLONOSCOPY  09/2009   Dr. Lindalou Hose: normal, internal hemorrhoids   . COLONOSCOPY N/A 02/28/2017   Procedure: COLONOSCOPY;  Surgeon: [name], MD;  Location: [facility];  Service: Endoscopy;  Laterality: N/A;  730   . CRANIECTOMY FOR EXCISION OF ACOUSTIC NEUROMA  3/95  . RADIOLOGY WITH ANESTHESIA N/A 07/07/2014   Procedure: EMBOLIZATION;  Surgeon: Rob Hickman, MD;  Location: [facility];  Service: Radiology;  Laterality: N/A;  .  TOTAL KNEE ARTHROPLASTY Left 07/06/2013   Procedure: LEFT TOTAL KNEE ARTHROPLASTY;  Surgeon: Gearlean Alf, MD;  Location: [facility];  Service: Orthopedics;  Laterality: Left;  . TOTAL KNEE ARTHROPLASTY Right 01/18/2014   Procedure: RIGHT TOTAL KNEE ARTHROPLASTY;  Surgeon: Gearlean Alf, MD;  Location: [facility];  Service: Orthopedics;  Laterality: Right;  . TRIGGER FINGER RELEASE  2003   (thumb) middle finger (2006)    Current Outpatient Rx  . Order #: [id number] Class: Print  . Order #: [id number] Class: Historical Med  . Order #: [id number] Class: Historical Med  . Order #: [id number] Class: Normal  . Order #: [id number] Class: Historical Med  . Order #: [id number] Class: Normal  . Order #: [id number] Class: Normal  . Order #: [id number] Class: Historical Med  . Order #: [id number] Class: Historical Med  . Order #: [id number] Class: Phone In  . Order #: [id number] Class: Historical Med  . Order #: [id number] Class: Historical Med  . Order #: [id number] Class: Historical Med  . Order #: [id number] Class: Print  . Order #: [id number] Class: Historical Med  . Order #: [id number] Class: Historical Med  . Order #: [id number] Class: Historical Med    Allergies Codeine and Tape  Family History  Problem Relation Age of Onset  . Cancer Father        oral cancer  . Breast cancer Mother   . Heart attack Mother   . Hypertension Sister        Bypass x4  . Alcohol abuse Sister   . Alzheimer's disease Sister   . Kidney disease Sister   . Colon cancer Neg Hx     Social History Social History   Tobacco Use  . Smoking status: Former Smoker    Packs/day: 1.50    Years: 30.00    Pack years: 45.00    Types: Cigarettes    Start date: 04/24/1957    Last attempt to quit: 10/15/1986    Years since quitting: 31.0  . Smokeless tobacco: Never Used  Substance Use Topics  . Alcohol use: No    Alcohol/week: 0.0 oz    Comment: Used to drink heavily at times  . Drug use: No    Review of Systems  All other systems negative  except as documented in the HPI. All pertinent positives and negatives as reviewed in the HPI. ____________________________________________   PHYSICAL EXAM:  VITAL SIGNS: ED Triage Vitals  Enc Vitals Group     BP 10/21/17 1208 (!) 158/85     Pulse Rate 10/21/17 1208 66     Resp 10/21/17 1208 18     Temp 10/21/17 1208 98 F (36.7 C)     Temp Source 10/21/17 1208 Oral     SpO2 10/21/17 1208 100 %     Weight 10/21/17 1207 230 lb (104.3 kg)     Height 10/21/17 1207 5\' 9"  (  1.753 m)    Constitutional: Alert and oriented. Well appearing and in no acute distress. Eyes: Conjunctivae are normal. PERRL. EOMI. Head: Atraumatic. Nose: No congestion/rhinnorhea. Mouth/Throat: Mucous membranes are moist.  Oropharynx non-erythematous. Neck: No stridor.  No meningeal signs.   Cardiovascular: Normal rate, regular rhythm. Good peripheral circulation. Grossly normal heart sounds.   Respiratory: Normal respiratory effort.  No retractions. Lungs CTAB. Gastrointestinal: Soft and nontender. No distention.  Musculoskeletal: No lower extremity tenderness nor edema. No gross deformities of extremities. Neurologic:  Normal speech and language. No gross focal neurologic deficits are appreciated.  Skin:  Skin is warm, dry and intact. Significant erythema to everything below the knee on RLE with edema and ttp as well. streakign up inner thigh on same side with mild painfuly lymphadenopathy on the same side.    ____________________________________________   LABS (all labs ordered are listed, but only abnormal results are displayed)  Labs Reviewed  COMPREHENSIVE METABOLIC PANEL - Abnormal; Notable for the following components:      Result Value   Sodium 125 (*)    Chloride 90 (*)    Albumin 3.4 (*)    All other components within normal limits  CBC WITH DIFFERENTIAL/PLATELET - Abnormal; Notable for the following components:   WBC 18.2 (*)    Neutro Abs 16.6 (*)    All other components within normal  limits  CULTURE, BLOOD (ROUTINE X 2)  CULTURE, BLOOD (ROUTINE X 2)  LACTIC ACID, PLASMA  URINALYSIS, ROUTINE W REFLEX MICROSCOPIC  LACTIC ACID, PLASMA   ____________________________________________  RADIOLOGY  Dg Chest 2 View  Result Date: 10/21/2017 CLINICAL DATA:  Infection, right leg swelling and redness. EXAM: CHEST  2 VIEW COMPARISON:  10/19/2017. FINDINGS: Trachea is midline. Heart size normal. Thoracic aorta is calcified. There are calcified pleural plaques bilaterally. No superimposed airspace consolidation or pleural fluid. IMPRESSION: 1. No acute findings. 2. Asbestos related pleural disease. 3.  Aortic atherosclerosis (ICD10-170.0). Electronically Signed   By: Lorin Picket M.D.   On: 10/21/2017 12:41   US Venous Img Lower Unilateral Right  Result Date: 10/21/2017 CLINICAL DATA:  Right lower extremity pain and redness and edema. EXAM: RIGHT LOWER EXTREMITY VENOUS DOPPLER ULTRASOUND TECHNIQUE: Gray-scale sonography with graded compression, as well as color Doppler and duplex ultrasound were performed to evaluate the lower extremity deep venous systems from the level of the common femoral vein and including the common femoral, femoral, profunda femoral, popliteal and calf veins including the posterior tibial, peroneal and gastrocnemius veins when visible. The superficial great saphenous vein was also interrogated. Spectral Doppler was utilized to evaluate flow at rest and with distal augmentation maneuvers in the common femoral, femoral and popliteal veins. COMPARISON:  None. FINDINGS: Contralateral Common Femoral Vein: Respiratory phasicity is normal and symmetric with the symptomatic side. No evidence of thrombus. Normal compressibility. Common Femoral Vein: No evidence of thrombus. Normal compressibility, respiratory phasicity and response to augmentation. Saphenofemoral Junction: No evidence of thrombus. Normal compressibility and flow on color Doppler imaging. Profunda Femoral Vein: No  evidence of thrombus. Normal compressibility and flow on color Doppler imaging. Femoral Vein: No evidence of thrombus. Normal compressibility, respiratory phasicity and response to augmentation. Popliteal Vein: No evidence of thrombus. Normal compressibility, respiratory phasicity and response to augmentation. Calf Veins: Visualized right deep calf veins are patent without thrombus. Other Findings:  Subcutaneous edema in the right calf. IMPRESSION: Negative for deep venous thrombosis in right lower extremity. Electronically Signed   By: Markus Daft M.D.   On: 10/21/2017  14:37    ____________________________________________   PROCEDURES  Procedure(s) performed:   Procedures   ____________________________________________   INITIAL IMPRESSION / ASSESSMENT AND PLAN / ED COURSE  Negative DVT US. Patient has what appears to be a rapidly worsening right lower extremity cellulitis.  Secondary to his age and the rapidity of these symptoms worsening even in the waiting room and streaking up his leg feel like he is at high risk for treatment failure so will discuss with hospitalist about admission for cellulitis at this time. No crepitus or systemic illness to suggest necrotizing fascitis at this time.     Pertinent labs & imaging results that were available during my care of the patient were reviewed by me and considered in my medical decision making (see chart for details).  ____________________________________________  FINAL CLINICAL IMPRESSION(S) / ED DIAGNOSES  Final diagnoses:  Cellulitis of right lower extremity  Hyponatremia     MEDICATIONS GIVEN DURING THIS VISIT:  Medications  vancomycin (VANCOCIN) IVPB 1000 mg/200 mL premix (1,000 mg Intravenous New Bag/Given 10/21/17 1649)  sodium chloride 0.9 % bolus 1,000 mL (1,000 mLs Intravenous New Bag/Given 10/21/17 1644)     Stacie Knutzen, Corene Cornea, MD 10/21/17 1702

## 2017-10-22 ENCOUNTER — Other Ambulatory Visit: Payer: Self-pay

## 2017-10-22 DIAGNOSIS — E871 Hypo-osmolality and hyponatremia: Secondary | ICD-10-CM

## 2017-10-22 DIAGNOSIS — G4733 Obstructive sleep apnea (adult) (pediatric): Secondary | ICD-10-CM

## 2017-10-22 LAB — CBC
HCT: 37.3 % — ABNORMAL LOW (ref 39.0–52.0)
HEMOGLOBIN: 12.3 g/dL — AB (ref 13.0–17.0)
MCH: 29.8 pg (ref 26.0–34.0)
MCHC: 33 g/dL (ref 30.0–36.0)
MCV: 90.3 fL (ref 78.0–100.0)
PLATELETS: 299 10*3/uL (ref 150–400)
RBC: 4.13 MIL/uL — ABNORMAL LOW (ref 4.22–5.81)
RDW: 13.7 % (ref 11.5–15.5)
WBC: 12.2 10*3/uL — ABNORMAL HIGH (ref 4.0–10.5)

## 2017-10-22 LAB — BASIC METABOLIC PANEL
Anion gap: 9 (ref 5–15)
BUN: 12 mg/dL (ref 6–20)
CALCIUM: 8.2 mg/dL — AB (ref 8.9–10.3)
CO2: 22 mmol/L (ref 22–32)
Chloride: 96 mmol/L — ABNORMAL LOW (ref 101–111)
Creatinine, Ser: 0.85 mg/dL (ref 0.61–1.24)
GFR calc Af Amer: >60 mL/min (ref >60)
Glucose, Bld: 94 mg/dL (ref 65–99)
POTASSIUM: 3.8 mmol/L (ref 3.5–5.1)
Sodium: 127 mmol/L — ABNORMAL LOW (ref 135–145)

## 2017-10-22 LAB — CK: CK TOTAL: 16 U/L — AB (ref 49–397)

## 2017-10-22 MED ORDER — POLYETHYLENE GLYCOL 3350 17 G PO PACK
17.0000 g | PACK | Freq: Every day | ORAL | Status: DC
  Administered 2017-10-22 – 2017-10-29 (×8): 17 g via ORAL
  Filled 2017-10-22 (×9): qty 1

## 2017-10-22 MED ORDER — ONDANSETRON HCL 4 MG/2ML IJ SOLN: 4.0000 mg | Freq: Four times a day (QID) | INTRAMUSCULAR | Status: DC | PRN

## 2017-10-22 MED ORDER — CEFAZOLIN SODIUM-DEXTROSE 2-4 GM/100ML-% IV SOLN
2.0000 g | Freq: Three times a day (TID) | INTRAVENOUS | Status: DC
  Administered 2017-10-22 – 2017-10-25 (×10): 2 g via INTRAVENOUS
  Filled 2017-10-22 (×19): qty 100

## 2017-10-22 NOTE — Progress Notes (Signed)
PROGRESS NOTE  Julian Fowler HWE:[id number] DOB: 26-Nov-1934 DOA: 10/21/2017 PCP: [name], MD  Brief History:  83 year old male with a history of OSA, hypertension, hyperlipidemia, GERD presenting with 3-day history of fevers, chills, and right leg pain.  The patient noted some erythema and leg pain that started on 10/19/2017.  He denies any recent injuries or trauma.  Over the next 24 hours, he noted the erythema continued to streak up proximally to his right inguinal area.  He went to see his primary care provider on 10/21/2017, and the patient was directed to the emergency department for further evaluation.  In addition, the patient was also seen in the emergency department on 10/19/2017 with cough, congestion, and myalgias.  Influenza PCR was negative.  Patient was sent home with oseltamivir.  Since then, his congestion is improving without any shortness of breath, headache, sore throat.  Upon presentation, the patient was noted to have sodium 125 with WBC 18.2.  He was started on ceftriaxone and received a dose of [facility] emergency department.  Assessment/Plan: Cellulitis right lower extremity -Discontinue ceftriaxone -Start cefazolin -Right lower extremity duplex negative for DVT -Lactic acid 1.0 -Follow blood cultures -Please see pictures below  Hyponatremia -Volume depletion in the setting of HCTZ use -Holding HCTZ -Continue IV fluids  Essential hypertension -Continue diltiazem CD, hydralazine, and losartan  History of SVT -Presently in sinus rhythm -Continue diltiazem CD  Hyperlipidemia -Continue statin       Disposition Plan:   Home in 1-2 days  Family Communication:   Spouse updated at bedside 1/7  Consultants:  none  Code Status:  FULL   DVT Prophylaxis:  [place] Heparin   Procedures: As Listed in Progress Note Above  Antibiotics: vanco 1/7 ceftriazone 1/7 Cefazolin 1/8>>>    Subjective: Patient still has significant pain right lower  extremity.  He has pain with weightbearing.  He states the erythema is slightly better.  He denies any fevers, chills, chest pain, shortness of breath, hemoptysis.  He has a nonproductive cough which is improving.  He denies any abdominal pain, diarrhea, dysuria, hematuria.  Objective: Vitals:   10/21/17 1800 10/21/17 1856 10/21/17 2056 10/22/17 0517  BP: (!) 101/50 (!) 185/68 140/61 136/63  Pulse: 76 77 84 71  Resp:  18 (!) 24 20  Temp:  99 F (37.2 C) 99.9 F (37.7 C) 98.3 F (36.8 C)  TempSrc:  Oral Oral Oral  SpO2: 98% 99% 98% 97%  Weight:   107 kg (235 lb 14.3 oz)   Height:   5\' 9"  (1.753 m)     Intake/Output Summary (Last 24 hours) at 10/22/2017 1124 Last data filed at 10/22/2017 0814 Gross per 24 hour  Intake 1055 ml  Output 1150 ml  Net -95 ml   Weight change:  Exam:   General:  Pt is alert, follows commands appropriately, not in acute distress  HEENT: No icterus, No thrush, No neck mass, [place]/AT  Cardiovascular: RRR, S1/S2, no rubs, no gallops  Respiratory: CTA bilaterally, no wheezing, no crackles, no rhonchi  Abdomen: Soft/+BS, non tender, non distended, no guarding  Extremities: 2+ edema, with erythema and lymphangitis streaking from the patient's toes up to the medial aspect of the right groin.  See pictures below       Data Reviewed: I have personally reviewed following labs and imaging studies Basic Metabolic Panel: Recent Labs  Lab 10/19/17 1453 10/21/17 1314 10/22/17 0545  NA 132* 125*  127*  K 3.7 3.9 3.8  CL 98* 90* 96*  CO2 22 26 22   GLUCOSE 95 94 94  BUN 13 16 12   CREATININE 0.92 0.99 0.85  CALCIUM 9.0 9.4 8.2*   Liver Function Tests: Recent Labs  Lab 10/19/17 1453 10/21/17 1314  AST 40 37  ALT 29 57  ALKPHOS 75 83  BILITOT 0.1* 0.3  PROT 7.2 7.5  ALBUMIN 3.7 3.4*   No results for input(s): LIPASE, AMYLASE in the last 168 hours. No results for input(s): AMMONIA in the last 168 hours. Coagulation Profile: No results for  input(s): INR, PROTIME in the last 168 hours. CBC: Recent Labs  Lab 10/19/17 1453 10/21/17 1314  WBC 20.4* 18.2*  NEUTROABS 18.5* 16.6*  HGB 13.0 13.5  HCT 38.8* 39.6  MCV 90.9 90.2  PLT 396 314   Cardiac Enzymes: No results for input(s): CKTOTAL, CKMB, CKMBINDEX, TROPONINI in the last 168 hours. BNP: Invalid input(s): POCBNP CBG: No results for input(s): GLUCAP in the last 168 hours. HbA1C: No results for input(s): HGBA1C in the last 72 hours. Urine analysis:    Component Value Date/Time   COLORURINE YELLOW 10/19/2017 1510   APPEARANCEUR CLEAR 10/19/2017 1510   LABSPEC 1.012 10/19/2017 1510   PHURINE 6.0 10/19/2017 1510   GLUCOSEU NEGATIVE 10/19/2017 1510   HGBUR NEGATIVE 10/19/2017 1510   HGBUR negative 10/27/2008 0838   BILIRUBINUR NEGATIVE 10/19/2017 1510   KETONESUR NEGATIVE 10/19/2017 1510   PROTEINUR NEGATIVE 10/19/2017 1510   UROBILINOGEN 0.2 01/11/2014 1405   NITRITE NEGATIVE 10/19/2017 1510   LEUKOCYTESUR TRACE (A) 10/19/2017 1510   Sepsis Labs: @LABRCNTIP (procalcitonin:4,lacticidven:4) ) Recent Results (from the past 240 hour(s))  Blood culture (routine x 2)     Status: None (Preliminary result)   Collection Time: 10/21/17  1:14 PM  Result Value Ref Range Status   Specimen Description BLOOD RIGHT ARM  Final   Special Requests   Final    BOTTLES DRAWN AEROBIC AND ANAEROBIC Blood Culture adequate volume   Culture NO GROWTH < 24 HOURS  Final   Report Status PENDING  Incomplete  Blood culture (routine x 2)     Status: None (Preliminary result)   Collection Time: 10/21/17  4:57 PM  Result Value Ref Range Status   Specimen Description BLOOD RIGHT HAND  Final   Special Requests   Final    BOTTLES DRAWN AEROBIC AND ANAEROBIC Blood Culture results may not be optimal due to an inadequate volume of blood received in culture bottles   Culture NO GROWTH < 24 HOURS  Final   Report Status PENDING  Incomplete     Scheduled Meds: . diltiazem  120 mg Oral Daily    . heparin  5,000 Units Subcutaneous Q8H  . hydrALAZINE  25 mg Oral TID  . LORazepam  1 mg Oral QHS  . losartan  100 mg Oral q morning - 10a  . pantoprazole  40 mg Oral Daily  . pravastatin  40 mg Oral QHS   Continuous Infusions: .  ceFAZolin (ANCEF) IV      Procedures/Studies: Dg Chest 2 View  Result Date: 10/21/2017 CLINICAL DATA:  Infection, right leg swelling and redness. EXAM: CHEST  2 VIEW COMPARISON:  10/19/2017. FINDINGS: Trachea is midline. Heart size normal. Thoracic aorta is calcified. There are calcified pleural plaques bilaterally. No superimposed airspace consolidation or pleural fluid. IMPRESSION: 1. No acute findings. 2. Asbestos related pleural disease. 3.  Aortic atherosclerosis (ICD10-170.0). Electronically Signed   By: Lorin Picket M.D.  On: 10/21/2017 12:41   Dg Chest 2 View  Result Date: 10/19/2017 CLINICAL DATA:  Fever. EXAM: CHEST  2 VIEW COMPARISON:  06/29/2017 and prior radiograph FINDINGS: Upper limits normal heart size again noted. Pleural calcifications are again noted. There is no evidence of focal airspace disease, pulmonary edema, suspicious pulmonary nodule/mass, pleural effusion, or pneumothorax. No acute bony abnormalities are identified. IMPRESSION: No active cardiopulmonary disease. Electronically Signed   By: Margarette Canada M.D.   On: 10/19/2017 15:34   US Venous Img Lower Unilateral Right  Result Date: 10/21/2017 CLINICAL DATA:  Right lower extremity pain and redness and edema. EXAM: RIGHT LOWER EXTREMITY VENOUS DOPPLER ULTRASOUND TECHNIQUE: Gray-scale sonography with graded compression, as well as color Doppler and duplex ultrasound were performed to evaluate the lower extremity deep venous systems from the level of the common femoral vein and including the common femoral, femoral, profunda femoral, popliteal and calf veins including the posterior tibial, peroneal and gastrocnemius veins when visible. The superficial great saphenous vein was also  interrogated. Spectral Doppler was utilized to evaluate flow at rest and with distal augmentation maneuvers in the common femoral, femoral and popliteal veins. COMPARISON:  None. FINDINGS: Contralateral Common Femoral Vein: Respiratory phasicity is normal and symmetric with the symptomatic side. No evidence of thrombus. Normal compressibility. Common Femoral Vein: No evidence of thrombus. Normal compressibility, respiratory phasicity and response to augmentation. Saphenofemoral Junction: No evidence of thrombus. Normal compressibility and flow on color Doppler imaging. Profunda Femoral Vein: No evidence of thrombus. Normal compressibility and flow on color Doppler imaging. Femoral Vein: No evidence of thrombus. Normal compressibility, respiratory phasicity and response to augmentation. Popliteal Vein: No evidence of thrombus. Normal compressibility, respiratory phasicity and response to augmentation. Calf Veins: Visualized right deep calf veins are patent without thrombus. Other Findings:  Subcutaneous edema in the right calf. IMPRESSION: Negative for deep venous thrombosis in right lower extremity. Electronically Signed   By: Markus Daft M.D.   On: 10/21/2017 14:37    Orson Eva, DO  [facility] Pager [phone]  If 7PM-7AM, please contact night-coverage [email or web address] Password [email or web address] 10/22/2017, 11:24 AM   LOS: 1 day

## 2017-10-23 DIAGNOSIS — J4520 Mild intermittent asthma, uncomplicated: Secondary | ICD-10-CM

## 2017-10-23 LAB — CBC
HEMATOCRIT: 33.7 % — AB (ref 39.0–52.0)
HEMOGLOBIN: 11.2 g/dL — AB (ref 13.0–17.0)
MCH: 29.7 pg (ref 26.0–34.0)
MCHC: 33.2 g/dL (ref 30.0–36.0)
MCV: 89.4 fL (ref 78.0–100.0)
Platelets: 328 10*3/uL (ref 150–400)
RBC: 3.77 MIL/uL — AB (ref 4.22–5.81)
RDW: 13.6 % (ref 11.5–15.5)
WBC: 12.9 10*3/uL — ABNORMAL HIGH (ref 4.0–10.5)

## 2017-10-23 MED ORDER — POTASSIUM CHLORIDE IN NACL 20-0.9 MEQ/L-% IV SOLN
INTRAVENOUS | Status: DC
  Administered 2017-10-23 – 2017-10-26 (×5): via INTRAVENOUS

## 2017-10-23 NOTE — Care Management Important Message (Signed)
Important Message  Patient Details  Name: HAGOP MCCOLLAM MRN: [id number] Date of Birth: 1934-10-27   Medicare Important Message Given:  Yes    Alexxa Sabet, Chauncey Reading, RN 10/23/2017, 12:13 PM

## 2017-10-23 NOTE — Progress Notes (Signed)
PROGRESS NOTE  Julian Fowler ZOX:[id number] DOB: March 19, 1935 DOA: 10/21/2017 PCP: [name], MD  Brief History:  83 year old male with a history of OSA, hypertension, hyperlipidemia, GERD presenting with 3-day history of fevers, chills, and right leg pain.  The patient noted some erythema and leg pain that started on 10/19/2017.  He denies any recent injuries or trauma.  Over the next 24 hours, he noted the erythema continued to streak up proximally to his right inguinal area.  He went to see his primary care provider on 10/21/2017, and the patient was directed to the emergency department for further evaluation.  In addition, the patient was also seen in the emergency department on 10/19/2017 with cough, congestion, and myalgias.  Influenza PCR was negative.  Patient was sent home with oseltamivir.  Since then, his congestion is improving without any shortness of breath, headache, sore throat.  Upon presentation, the patient was noted to have sodium 125 with WBC 18.2.  He was started on ceftriaxone and received a dose of [facility] emergency department.  Assessment/Plan: Cellulitis right lower extremity -Discontinue ceftriaxone -continue cefazolin -Right lower extremity duplex negative for DVT -Lactic acid 1.0 -Follow blood cultures--neg -improvement has been slow  -still with leukocytosis and low grade temp and pain with weight bearing -compartments soft without pain -Please see pictures below -CK 16 -am CBC  Hyponatremia -Volume depletion in the setting of HCTZ use -Holding HCTZ -Continue IV fluids -am BMP  Essential hypertension -Continue diltiazem CD, hydralazine, and losartan  History of SVT -Presently in sinus rhythm -Continue diltiazem CD  Hyperlipidemia -Continue statin   Disposition Plan:   Home in 1-2 days  Family Communication:   Spouse updated at bedside 1/9  Consultants:  none  Code Status:  FULL   DVT Prophylaxis:  [place]  Heparin   Procedures: As Listed in Progress Note Above  Antibiotics: vanco 1/7 ceftriazone 1/7 Cefazolin 1/8>>>      Subjective: Patient states that the right leg is improving slowly.  He is having less pain with weightbearing.  He states that edema and pain are improving but distal part of his foot and leg is still remains quite erythematous.  He denies any fevers, chills, chest pain, shortness breath, nausea, vomiting, diarrhea, abdominal pain.  No dysuria or hematuria.  Objective: Vitals:   10/22/17 1515 10/22/17 1740 10/22/17 2146 10/23/17 0650  BP:   (!) 142/47 (!) 147/58  Pulse:   65 70  Resp:   20 18  Temp: 100.1 F (37.8 C) 99.6 F (37.6 C) 100 F (37.8 C) 99 F (37.2 C)  TempSrc: Oral Oral Oral Oral  SpO2:   97% 100%  Weight:      Height:        Intake/Output Summary (Last 24 hours) at 10/23/2017 1348 Last data filed at 10/23/2017 0559 Gross per 24 hour  Intake 600 ml  Output 1800 ml  Net -1200 ml   Weight change:  Exam:   General:  Pt is alert, follows commands appropriately, not in acute distress  HEENT: No icterus, No thrush, No neck mass, [place]/AT  Cardiovascular: RRR, S1/S2, no rubs, no gallops  Respiratory: CTA bilaterally, no wheezing, no crackles, no rhonchi  Abdomen: Soft/+BS, non tender, non distended, no guarding  Extremities: 2 + RLE edema, No lymphangitis, No petechiae, No rashes, no synovitis       Data Reviewed: I have personally reviewed following labs and imaging studies Basic Metabolic Panel:  Recent Labs  Lab 10/19/17 1453 10/21/17 1314 10/22/17 0545  NA 132* 125* 127*  K 3.7 3.9 3.8  CL 98* 90* 96*  CO2 22 26 22   GLUCOSE 95 94 94  BUN 13 16 12   CREATININE 0.92 0.99 0.85  CALCIUM 9.0 9.4 8.2*   Liver Function Tests: Recent Labs  Lab 10/19/17 1453 10/21/17 1314  AST 40 37  ALT 29 57  ALKPHOS 75 83  BILITOT 0.1* 0.3  PROT 7.2 7.5  ALBUMIN 3.7 3.4*   No results for input(s): LIPASE, AMYLASE in the last 168  hours. No results for input(s): AMMONIA in the last 168 hours. Coagulation Profile: No results for input(s): INR, PROTIME in the last 168 hours. CBC: Recent Labs  Lab 10/19/17 1453 10/21/17 1314 10/22/17 1202 10/23/17 0538  WBC 20.4* 18.2* 12.2* 12.9*  NEUTROABS 18.5* 16.6*  --   --   HGB 13.0 13.5 12.3* 11.2*  HCT 38.8* 39.6 37.3* 33.7*  MCV 90.9 90.2 90.3 89.4  PLT 396 314 299 328   Cardiac Enzymes: Recent Labs  Lab 10/22/17 1202  CKTOTAL 16*   BNP: Invalid input(s): POCBNP CBG: No results for input(s): GLUCAP in the last 168 hours. HbA1C: No results for input(s): HGBA1C in the last 72 hours. Urine analysis:    Component Value Date/Time   COLORURINE YELLOW 10/19/2017 1510   APPEARANCEUR CLEAR 10/19/2017 1510   LABSPEC 1.012 10/19/2017 1510   PHURINE 6.0 10/19/2017 1510   GLUCOSEU NEGATIVE 10/19/2017 1510   HGBUR NEGATIVE 10/19/2017 1510   HGBUR negative 10/27/2008 0838   BILIRUBINUR NEGATIVE 10/19/2017 1510   KETONESUR NEGATIVE 10/19/2017 1510   PROTEINUR NEGATIVE 10/19/2017 1510   UROBILINOGEN 0.2 01/11/2014 1405   NITRITE NEGATIVE 10/19/2017 1510   LEUKOCYTESUR TRACE (A) 10/19/2017 1510   Sepsis Labs: @LABRCNTIP (procalcitonin:4,lacticidven:4) ) Recent Results (from the past 240 hour(s))  Blood culture (routine x 2)     Status: None (Preliminary result)   Collection Time: 10/21/17  1:14 PM  Result Value Ref Range Status   Specimen Description BLOOD RIGHT ARM  Final   Special Requests   Final    BOTTLES DRAWN AEROBIC AND ANAEROBIC Blood Culture adequate volume   Culture NO GROWTH 2 DAYS  Final   Report Status PENDING  Incomplete  Blood culture (routine x 2)     Status: None (Preliminary result)   Collection Time: 10/21/17  4:57 PM  Result Value Ref Range Status   Specimen Description BLOOD RIGHT HAND  Final   Special Requests   Final    BOTTLES DRAWN AEROBIC AND ANAEROBIC Blood Culture results may not be optimal due to an inadequate volume of blood  received in culture bottles   Culture NO GROWTH 2 DAYS  Final   Report Status PENDING  Incomplete     Scheduled Meds: . diltiazem  120 mg Oral Daily  . heparin  5,000 Units Subcutaneous Q8H  . hydrALAZINE  25 mg Oral TID  . LORazepam  1 mg Oral QHS  . losartan  100 mg Oral q morning - 10a  . pantoprazole  40 mg Oral Daily  . polyethylene glycol  17 g Oral Daily  . pravastatin  40 mg Oral QHS   Continuous Infusions: . 0.9 % NaCl with KCl 20 mEq / L    .  ceFAZolin (ANCEF) IV 2 g (10/23/17 0554)    Procedures/Studies: Dg Chest 2 View  Result Date: 10/21/2017 CLINICAL DATA:  Infection, right leg swelling and redness. EXAM: CHEST  2 VIEW COMPARISON:  10/19/2017. FINDINGS: Trachea is midline. Heart size normal. Thoracic aorta is calcified. There are calcified pleural plaques bilaterally. No superimposed airspace consolidation or pleural fluid. IMPRESSION: 1. No acute findings. 2. Asbestos related pleural disease. 3.  Aortic atherosclerosis (ICD10-170.0). Electronically Signed   By: Lorin Picket M.D.   On: 10/21/2017 12:41   Dg Chest 2 View  Result Date: 10/19/2017 CLINICAL DATA:  Fever. EXAM: CHEST  2 VIEW COMPARISON:  06/29/2017 and prior radiograph FINDINGS: Upper limits normal heart size again noted. Pleural calcifications are again noted. There is no evidence of focal airspace disease, pulmonary edema, suspicious pulmonary nodule/mass, pleural effusion, or pneumothorax. No acute bony abnormalities are identified. IMPRESSION: No active cardiopulmonary disease. Electronically Signed   By: Margarette Canada M.D.   On: 10/19/2017 15:34   US Venous Img Lower Unilateral Right  Result Date: 10/21/2017 CLINICAL DATA:  Right lower extremity pain and redness and edema. EXAM: RIGHT LOWER EXTREMITY VENOUS DOPPLER ULTRASOUND TECHNIQUE: Gray-scale sonography with graded compression, as well as color Doppler and duplex ultrasound were performed to evaluate the lower extremity deep venous systems from the  level of the common femoral vein and including the common femoral, femoral, profunda femoral, popliteal and calf veins including the posterior tibial, peroneal and gastrocnemius veins when visible. The superficial great saphenous vein was also interrogated. Spectral Doppler was utilized to evaluate flow at rest and with distal augmentation maneuvers in the common femoral, femoral and popliteal veins. COMPARISON:  None. FINDINGS: Contralateral Common Femoral Vein: Respiratory phasicity is normal and symmetric with the symptomatic side. No evidence of thrombus. Normal compressibility. Common Femoral Vein: No evidence of thrombus. Normal compressibility, respiratory phasicity and response to augmentation. Saphenofemoral Junction: No evidence of thrombus. Normal compressibility and flow on color Doppler imaging. Profunda Femoral Vein: No evidence of thrombus. Normal compressibility and flow on color Doppler imaging. Femoral Vein: No evidence of thrombus. Normal compressibility, respiratory phasicity and response to augmentation. Popliteal Vein: No evidence of thrombus. Normal compressibility, respiratory phasicity and response to augmentation. Calf Veins: Visualized right deep calf veins are patent without thrombus. Other Findings:  Subcutaneous edema in the right calf. IMPRESSION: Negative for deep venous thrombosis in right lower extremity. Electronically Signed   By: Markus Daft M.D.   On: 10/21/2017 14:37    Orson Eva, DO  [facility] Pager [phone]  If 7PM-7AM, please contact night-coverage [email or web address] Password [email or web address] 10/23/2017, 1:48 PM   LOS: 2 days

## 2017-10-24 DIAGNOSIS — L03115 Cellulitis of right lower limb: Principal | ICD-10-CM

## 2017-10-24 DIAGNOSIS — I10 Essential (primary) hypertension: Secondary | ICD-10-CM

## 2017-10-24 LAB — CBC
HCT: 33.8 % — ABNORMAL LOW (ref 39.0–52.0)
HEMOGLOBIN: 11.6 g/dL — AB (ref 13.0–17.0)
MCH: 30.2 pg (ref 26.0–34.0)
MCHC: 34.3 g/dL (ref 30.0–36.0)
MCV: 88 fL (ref 78.0–100.0)
Platelets: 344 10*3/uL (ref 150–400)
RBC: 3.84 MIL/uL — ABNORMAL LOW (ref 4.22–5.81)
RDW: 13.3 % (ref 11.5–15.5)
WBC: 10.6 10*3/uL — ABNORMAL HIGH (ref 4.0–10.5)

## 2017-10-24 LAB — BASIC METABOLIC PANEL
ANION GAP: 9 (ref 5–15)
BUN: 8 mg/dL (ref 6–20)
CALCIUM: 8.2 mg/dL — AB (ref 8.9–10.3)
CO2: 22 mmol/L (ref 22–32)
Chloride: 97 mmol/L — ABNORMAL LOW (ref 101–111)
Creatinine, Ser: 0.75 mg/dL (ref 0.61–1.24)
GFR calc Af Amer: >60 mL/min (ref >60)
GLUCOSE: 97 mg/dL (ref 65–99)
Potassium: 3.8 mmol/L (ref 3.5–5.1)
Sodium: 128 mmol/L — ABNORMAL LOW (ref 135–145)

## 2017-10-24 NOTE — Progress Notes (Signed)
PROGRESS NOTE    TERALD JUMP  Julian Fowler:[id number] DOB: 20-Aug-1935 DOA: 10/21/2017 PCP: [name], MD     Brief Narrative:  83 year old man admitted from home on 1/7 due to pain and swelling of his right leg that began on 1/5.  Admitted with right lower extremity cellulitis.   Assessment & Plan:   Principal Problem:   Cellulitis Active Problems:   Asthma, mild intermittent, well-controlled   Essential hypertension   OSA (obstructive sleep apnea)   Cardiac murmur   Right lower extremity cellulitis -Continues to show slow improvement on Ancef.  See below pictures for details. -Right lower extremity negative for DVT.  - Leukocytosis continues to improve.  Hyponatremia -Due to hypovolemia in the setting of hydrochlorothiazide use. -Hydrochlorothiazide remains on hold, continue IV fluids.  Essential hypertension -Fair control, continue current regimen.  History of SVT -Currently in sinus rhythm, continue Cardizem CD.  Hyperlipidemia -Continue statin.   DVT prophylaxis: Subcutaneous heparin Code Status: Full code Family Communication: Sister-in-law at bedside Disposition Plan: Hope for discharge home over next 48-72 hours  Consultants:   None  Procedures:   None  Antimicrobials:  Anti-infectives (From admission, onward)   Start     Dose/Rate Route Frequency Ordered Stop   10/22/17 1400  ceFAZolin (ANCEF) IVPB 2g/100 mL premix     2 g 200 mL/hr over 30 Minutes Intravenous Every 8 hours 10/22/17 1123     10/21/17 1800  cefTRIAXone (ROCEPHIN) 2 g in dextrose 5 % 50 mL IVPB  Status:  Discontinued     2 g 100 mL/hr over 30 Minutes Intravenous Every 24 hours 10/21/17 1737 10/22/17 1123   10/21/17 1630  vancomycin (VANCOCIN) IVPB 1000 mg/200 mL premix     1,000 mg 200 mL/hr over 60 Minutes Intravenous  Once 10/21/17 1629 10/21/17 1758       Subjective: Still with significant pain and difficulty ambulating, denies fevers or chills.  Objective: Vitals:    10/23/17 1800 10/23/17 2024 10/24/17 0600 10/24/17 1640  BP:  (!) 153/75 (!) 149/72 (!) 157/67  Pulse:  74 77 66  Resp:  18 18 18   Temp: 100 F (37.8 C) 99.7 F (37.6 C) 97.8 F (36.6 C) 99.4 F (37.4 C)  TempSrc: Oral Oral Oral Oral  SpO2:  97% 100% 96%  Weight:      Height:        Intake/Output Summary (Last 24 hours) at 10/24/2017 1839 Last data filed at 10/24/2017 1800 Gross per 24 hour  Intake 2868.75 ml  Output 3650 ml  Net -781.25 ml   Filed Weights   10/21/17 1207 10/21/17 2056  Weight: 104.3 kg (230 lb) 107 kg (235 lb 14.3 oz)    Examination:  General exam: Alert, awake, oriented x 3 Respiratory system: Clear to auscultation. Respiratory effort normal. Cardiovascular system:RRR. No murmurs, rubs, gallops. Gastrointestinal system: Abdomen is nondistended, soft and nontender. No organomegaly or masses felt. Normal bowel sounds heard. Central nervous system: Alert and oriented. No focal neurological deficits. Extremities: Left: No clubbing, cyanosis or edema; right: See picture        Psychiatry: Judgement and insight appear normal. Mood & affect appropriate.     Data Reviewed: I have personally reviewed following labs and imaging studies  CBC: Recent Labs  Lab 10/19/17 1453 10/21/17 1314 10/22/17 1202 10/23/17 0538 10/24/17 0538  WBC 20.4* 18.2* 12.2* 12.9* 10.6*  NEUTROABS 18.5* 16.6*  --   --   --   HGB 13.0 13.5 12.3* 11.2* 11.6*  HCT 38.8* 39.6 37.3* 33.7* 33.8*  MCV 90.9 90.2 90.3 89.4 88.0  PLT 396 314 299 328 376   Basic Metabolic Panel: Recent Labs  Lab 10/19/17 1453 10/21/17 1314 10/22/17 0545 10/24/17 0538  NA 132* 125* 127* 128*  K 3.7 3.9 3.8 3.8  CL 98* 90* 96* 97*  CO2 22 26 22 22   GLUCOSE 95 94 94 97  BUN 13 16 12 8   CREATININE 0.92 0.99 0.85 0.75  CALCIUM 9.0 9.4 8.2* 8.2*   GFR: Estimated Creatinine Clearance: 88.8 mL/min (by C-G formula based on SCr of 0.75 mg/dL). Liver Function Tests: Recent Labs  Lab  10/19/17 1453 10/21/17 1314  AST 40 37  ALT 29 57  ALKPHOS 75 83  BILITOT 0.1* 0.3  PROT 7.2 7.5  ALBUMIN 3.7 3.4*   No results for input(s): LIPASE, AMYLASE in the last 168 hours. No results for input(s): AMMONIA in the last 168 hours. Coagulation Profile: No results for input(s): INR, PROTIME in the last 168 hours. Cardiac Enzymes: Recent Labs  Lab 10/22/17 1202  CKTOTAL 16*   BNP (last 3 results) No results for input(s): PROBNP in the last 8760 hours. HbA1C: No results for input(s): HGBA1C in the last 72 hours. CBG: No results for input(s): GLUCAP in the last 168 hours. Lipid Profile: No results for input(s): CHOL, HDL, LDLCALC, TRIG, CHOLHDL, LDLDIRECT in the last 72 hours. Thyroid Function Tests: No results for input(s): TSH, T4TOTAL, FREET4, T3FREE, THYROIDAB in the last 72 hours. Anemia Panel: No results for input(s): VITAMINB12, FOLATE, FERRITIN, TIBC, IRON, RETICCTPCT in the last 72 hours. Urine analysis:    Component Value Date/Time   COLORURINE YELLOW 10/19/2017 [place] 10/19/2017 1510   LABSPEC 1.012 10/19/2017 1510   PHURINE 6.0 10/19/2017 1510   GLUCOSEU NEGATIVE 10/19/2017 1510   HGBUR NEGATIVE 10/19/2017 1510   HGBUR negative 10/27/2008 0838   BILIRUBINUR NEGATIVE 10/19/2017 1510   KETONESUR NEGATIVE 10/19/2017 1510   PROTEINUR NEGATIVE 10/19/2017 1510   UROBILINOGEN 0.2 01/11/2014 1405   NITRITE NEGATIVE 10/19/2017 1510   LEUKOCYTESUR TRACE (A) 10/19/2017 1510   Sepsis Labs: @LABRCNTIP (procalcitonin:4,lacticidven:4)  ) Recent Results (from the past 240 hour(s))  Blood culture (routine x 2)     Status: None (Preliminary result)   Collection Time: 10/21/17  1:14 PM  Result Value Ref Range Status   Specimen Description BLOOD RIGHT ARM  Final   Special Requests   Final    BOTTLES DRAWN AEROBIC AND ANAEROBIC Blood Culture adequate volume   Culture NO GROWTH 3 DAYS  Final   Report Status PENDING  Incomplete  Blood culture  (routine x 2)     Status: None (Preliminary result)   Collection Time: 10/21/17  4:57 PM  Result Value Ref Range Status   Specimen Description BLOOD RIGHT HAND  Final   Special Requests   Final    BOTTLES DRAWN AEROBIC AND ANAEROBIC Blood Culture results may not be optimal due to an inadequate volume of blood received in culture bottles   Culture NO GROWTH 3 DAYS  Final   Report Status PENDING  Incomplete         Radiology Studies: No results found.      Scheduled Meds: . diltiazem  120 mg Oral Daily  . heparin  5,000 Units Subcutaneous Q8H  . hydrALAZINE  25 mg Oral TID  . LORazepam  1 mg Oral QHS  . losartan  100 mg Oral q morning - 10a  . pantoprazole  40 mg Oral Daily  . polyethylene glycol  17 g Oral Daily  . pravastatin  40 mg Oral QHS   Continuous Infusions: . 0.9 % NaCl with KCl 20 mEq / L 75 mL/hr at 10/24/17 1825  .  ceFAZolin (ANCEF) IV Stopped (10/24/17 1519)     LOS: 3 days    Time spent: 25 minutes. Greater than 50% of this time was spent in direct contact with the patient coordinating care.     Lelon Frohlich, MD [facility] Pager [phone]  If 7PM-7AM, please contact night-coverage [email or web address] Password [facility] 10/24/2017, 6:39 PM

## 2017-10-25 LAB — BASIC METABOLIC PANEL
Anion gap: 9 (ref 5–15)
BUN: 7 mg/dL (ref 6–20)
CALCIUM: 7.9 mg/dL — AB (ref 8.9–10.3)
CHLORIDE: 97 mmol/L — AB (ref 101–111)
CO2: 22 mmol/L (ref 22–32)
CREATININE: 0.68 mg/dL (ref 0.61–1.24)
GFR calc non Af Amer: >60 mL/min (ref >60)
GLUCOSE: 95 mg/dL (ref 65–99)
Potassium: 4 mmol/L (ref 3.5–5.1)
Sodium: 128 mmol/L — ABNORMAL LOW (ref 135–145)

## 2017-10-25 LAB — CBC
HCT: 33 % — ABNORMAL LOW (ref 39.0–52.0)
Hemoglobin: 11.2 g/dL — ABNORMAL LOW (ref 13.0–17.0)
MCH: 30.2 pg (ref 26.0–34.0)
MCHC: 33.9 g/dL (ref 30.0–36.0)
MCV: 88.9 fL (ref 78.0–100.0)
PLATELETS: 417 10*3/uL — AB (ref 150–400)
RBC: 3.71 MIL/uL — AB (ref 4.22–5.81)
RDW: 13.7 % (ref 11.5–15.5)
WBC: 11.9 10*3/uL — ABNORMAL HIGH (ref 4.0–10.5)

## 2017-10-25 MED ORDER — VANCOMYCIN HCL IN DEXTROSE 750-5 MG/150ML-% IV SOLN
750.0000 mg | Freq: Two times a day (BID) | INTRAVENOUS | Status: DC
  Administered 2017-10-26 – 2017-10-29 (×7): 750 mg via INTRAVENOUS
  Filled 2017-10-25 (×9): qty 150

## 2017-10-25 MED ORDER — PSYLLIUM 95 % PO PACK
1.0000 | PACK | Freq: Two times a day (BID) | ORAL | Status: DC
  Administered 2017-10-25 – 2017-10-30 (×11): 1 via ORAL
  Filled 2017-10-25 (×3): qty 1
  Filled 2017-10-25: qty 2
  Filled 2017-10-25 (×5): qty 1
  Filled 2017-10-25: qty 2
  Filled 2017-10-25 (×2): qty 1

## 2017-10-25 MED ORDER — PIPERACILLIN-TAZOBACTAM 3.375 G IVPB
3.3750 g | Freq: Three times a day (TID) | INTRAVENOUS | Status: DC
  Administered 2017-10-25 – 2017-10-29 (×13): 3.375 g via INTRAVENOUS
  Filled 2017-10-25 (×12): qty 50

## 2017-10-25 MED ORDER — LATANOPROST 0.005 % OP SOLN
1.0000 [drp] | Freq: Every day | OPHTHALMIC | Status: DC
  Administered 2017-10-25 – 2017-10-29 (×5): 1 [drp] via OPHTHALMIC
  Filled 2017-10-25: qty 2.5

## 2017-10-25 MED ORDER — VANCOMYCIN HCL 10 G IV SOLR
2000.0000 mg | Freq: Once | INTRAVENOUS | Status: AC
  Administered 2017-10-25: 2000 mg via INTRAVENOUS
  Filled 2017-10-25: qty 2000

## 2017-10-25 NOTE — Progress Notes (Signed)
Pharmacy Antibiotic Note  Julian Fowler is a 83 y.o. male admitted on 10/21/2017 with cellulitis.  Pharmacy has been consulted for Vancomycin and zosyn dosing.  Plan: Vancomycin 2000mg  loading dose then 750mg   IV every 12 hours.  Goal trough 10-15 mcg/mL. Zosyn 3.375g IV q8h (4 hour infusion).  F/Ucxs and clinical progress Monitor V/S, labs, and levels as indicated  Height: 5\' 9"  (175.3 cm) Weight: 235 lb 14.3 oz (107 kg) IBW/kg (Calculated) : 70.7  Temp (24hrs), Avg:99.4 F (37.4 C), Min:98.8 F (37.1 C), Max:99.9 F (37.7 C)  Recent Labs  Lab 10/19/17 1453 10/21/17 1314 10/22/17 0545 10/22/17 1202 10/23/17 0538 10/24/17 0538 10/25/17 0538  WBC 20.4* 18.2*  --  12.2* 12.9* 10.6* 11.9*  CREATININE 0.92 0.99 0.85  --   --  0.75 0.68  LATICACIDVEN 2.1* 1.0  --   --   --   --   --    Normalized CrCL is 31mls/min Estimated Creatinine Clearance: 88.8 mL/min (by C-G formula based on SCr of 0.68 mg/dL).    Allergies  Allergen Reactions  . Codeine Other (See Comments)    REACTION: groggy  . Tape Other (See Comments)    Skin tears, use paper tape    Antimicrobials this admission: Vancomycin x 1 dose 1/7 now restart 1/11>>  Zosyn 1/11 >> Cefazolin 1/8>>1/11 Ceftriaxone 1/7>>1/8   Dose adjustments this admission: N/A  Microbiology results: 1/7 BCx: ngtd  Thank you for allowing pharmacy to be a part of this patient's care.  Isac Sarna, BS [place], [place] Clinical Pharmacist Pager [phone] 10/25/2017 4:53 PM

## 2017-10-25 NOTE — Care Management Important Message (Signed)
Important Message  Patient Details  Name: Julian Fowler MRN: [id number] Date of Birth: [birth year]   Medicare Important Message Given:  Yes    Ailis Rigaud, Chauncey [place], RN 10/25/2017, 12:44 PM

## 2017-10-25 NOTE — Plan of Care (Signed)
Patient insisting to get out of bed and go to the bathroom whenever needed. However, in report, 3rd shift indicated that patient is not steady and able to walk d/t in increased pain. Since patient reports not to be at baseline, PT consult ordered to assess. Spoke with PT to assess as soon as possible d/t to patient's anxiety levels.

## 2017-10-25 NOTE — Evaluation (Signed)
Physical Therapy Evaluation Patient Details Name: Julian Fowler MRN: [id number] DOB: 28-Jul-1935 Today's Date: 10/25/2017   History of Present Illness  Julian Fowler is an 83yo white male who comes to [facility] on 10/21/17 after 2d swelling, redness, and pain in RLE, associated with fever: pt admitted for cellulitis. PMH: asthma, HTN, SVT, OSA, asbestosis, bilat TKA. 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   Clinical Impression  Pt admitted with above diagnosis. Pt currently with functional limitations due to the deficits listed below (see "PT Problem List"). Upon entry, the patient is received at EOB having recently finished showering with NA, wife and niece present. The pt is awake and agreeable to participate. Pt reports 5/10 pain in RLE knee to toes, heavy erythema, visible edema, and glossy taut skin. Skin integrity improved with elevation at end of session with elevation. The pt is alert and oriented x3, pleasant, conversational, and following simple and multi-step commands consistently. He has DOE with AMB, but VSS. Functional mobility assessment demonstrates moderate-heavy strength impairment in trunk and BLE, the pt now requiring near-maximal effort for bed mobility, transfers, and gait, whereas the patient performed these at a higher level of independence PTA. Empirically, the patient demonstrates increased risk of recurrent falls AEB gait speed <0.31m/s and forward reach <5". Pt will benefit from skilled PT intervention to increase independence and safety with basic mobility in preparation for discharge to the venue listed below.       Follow Up Recommendations Home health PT;Supervision for mobility/OOB    Equipment Recommendations  None recommended by PT    Recommendations for Other Services       Precautions / Restrictions Precautions Precautions: Fall Precaution Comments: elevate right LE when in bed.  Restrictions Weight Bearing  Restrictions: No      Mobility  Bed Mobility Overal bed mobility: Needs Assistance Bed Mobility: Sit to Supine       Sit to supine: Supervision   General bed mobility comments: needs assistance elevating RLE; poor motor control, requires heavy effort.   Transfers Overall transfer level: Needs assistance Equipment used: Rolling walker (2 wheeled) Transfers: Sit to/from Stand Sit to Stand: Min guard         General transfer comment: requires help with RW stabilization; multple attempts required, maximal effort reqired with heavy BUE support  Ambulation/Gait Ambulation/Gait assistance: Supervision Ambulation Distance (Feet): 65 Feet Assistive device: Rolling walker (2 wheeled) Gait Pattern/deviations: Step-to pattern(3-point step-to gait with RW, heavy BUE support to offload RLE d/t pain) Gait velocity: <0.44m/s Gait velocity interpretation: <1.8 ft/sec, indicative of risk for recurrent falls General Gait Details: stops periodically q 34ft to rest 30-60sec d/t pain  Stairs            Wheelchair Mobility    Modified Rankin (Stroke Patients Only)       Balance Overall balance assessment: Modified Independent                                           Pertinent Vitals/Pain Pain Assessment: 0-10 Pain Score: 5  Pain Location: Right lower leg to foot  Pain Descriptors / Indicators: Constant;Sore;Sharp Pain Intervention(s): Limited activity within patient's tolerance;Monitored during session;Patient requesting pain meds-RN notified    Home Living Family/patient expects to be discharged to:: Private residence Living Arrangements: Spouse/significant other Available Help at Discharge: Family Type of Home: House  Home Access: Level entry     Home Layout: One level;Laundry or work area in [place]: Environmental consultant - 2 wheels;Cane - single point      Prior Function Level of Independence: Independent               Hand Dominance         Extremity/Trunk Assessment   Upper Extremity Assessment Upper Extremity Assessment: Overall WFL for tasks assessed    Lower Extremity Assessment Lower Extremity Assessment: Generalized weakness    Cervical / Trunk Assessment Cervical / Trunk Assessment: Normal  Communication   Communication: No difficulties  Cognition Arousal/Alertness: Awake/alert Behavior During Therapy: WFL for tasks assessed/performed Overall Cognitive Status: Within Functional Limits for tasks assessed                                        General Comments      Exercises     Assessment/Plan    PT Assessment Patient needs continued PT services  PT Problem List Decreased strength;Decreased activity tolerance;Decreased mobility;Pain       PT Treatment Interventions Gait training;Stair training;Functional mobility training;Therapeutic activities;Therapeutic exercise;Patient/family education    PT Goals (Current goals can be found in the Care Plan section)  Acute Rehab PT Goals Patient Stated Goal: return to swimming  PT Goal Formulation: With patient Time For Goal Achievement: 11/08/17 Potential to Achieve Goals: Good    Frequency Min 3X/week   Barriers to discharge        Co-evaluation               AM-PAC PT "6 Clicks" Daily Activity  Outcome Measure Difficulty turning over in bed (including adjusting bedclothes, sheets and blankets)?: A Little Difficulty moving from lying on back to sitting on the side of the bed? : A Lot Difficulty sitting down on and standing up from a chair with arms (e.g., wheelchair, bedside commode, etc,.)?: A Lot Help needed moving to and from a bed to chair (including a wheelchair)?: A Lot Help needed walking in hospital room?: A Little Help needed climbing 3-5 steps with a railing? : A Lot 6 Click Score: 14    End of Session Equipment Utilized During Treatment: Gait belt Activity Tolerance: Patient tolerated treatment  well;Patient limited by pain Patient left: in bed;with call bell/phone within reach;with bed alarm set;with family/visitor present(leg elevated) Nurse Communication: Mobility status;Precautions;Patient requests pain meds PT Visit Diagnosis: Difficulty in walking, not elsewhere classified (R26.2);Other abnormalities of gait and mobility (R26.89)    Time: 9798-9211 PT Time Calculation (min) (ACUTE ONLY): 26 min   Charges:   PT Evaluation $PT Eval Low Complexity: 1 Low PT Treatments $Therapeutic Activity: 8-22 mins   PT G Codes:        2:37 PM, 2017/11/18 Etta Grandchild, PT, DPT Physical Therapist - [place] [phone] (Office)   Chantay Whitelock C 11-18-17, 2:29 PM

## 2017-10-25 NOTE — Plan of Care (Signed)
Patient's bed alarm turned on d/t patient's insistence of getting out of bed when he wants to. Patient educated and explained that he needs to call us for help. Yet, " patient stated he doesn't care if alarm goes off, "I will get up when I want to."  Bed alarm left intact, to give staff a little "warning" of him getting up.

## 2017-10-25 NOTE — Care Management Note (Addendum)
Case Management Note  Patient Details  Name: Julian Fowler MRN: [id number] Date of Birth: [birth year]  Subjective/Objective:    Adm with cellulitis. From home with wife. Ind with ADL's .Has PCP, transportation and reports no issues affording medications. PT eval pending.  Patient would like [facility] ([facility]) for [facility].  Patient has RW and cane at home if needed.              Action/Plan: Anticipate DC home with HH. Jermaine of [facility] notified and will obtain orders when/if ordered.   Expected Discharge Date:    10/28/2017           Expected Discharge Plan:  [place]  In-House Referral:     Discharge planning Services  CM Consult  Post Acute Care Choice:  Home Health Choice offered to:  Patient  DME Arranged:    DME Agency:     HH Arranged:  PT [place]:  [place]  Status of Service:  In process, will continue to follow  If discussed at Long Length of Stay Meetings, dates discussed:    Additional Comments:  Jontue Crumpacker, Chauncey Reading, RN 10/25/2017, 12:41 PM

## 2017-10-25 NOTE — Progress Notes (Signed)
PROGRESS NOTE    Julian Fowler  NLZ:[id number] DOB: [birth year] DOA: 10/21/2017 PCP: [name], MD     Brief Narrative:  83 year old man admitted from home on 1/7 due to pain and swelling of his right leg that began on 1/5.  Admitted with right lower extremity cellulitis.  Has only had minimal improvement, will transition over to broad-spectrum antibiotics on 1/11.   Assessment & Plan:   Principal Problem:   Cellulitis Active Problems:   Asthma, mild intermittent, well-controlled   Essential hypertension   OSA (obstructive sleep apnea)   Cardiac murmur   Right lower extremity cellulitis -Continues to may be show some slow improvement. -Has been on Ancef.  We will discontinue Ancef and start broad-spectrum antibiotic therapy with vancomycin and Zosyn to see if we can achieve better results. -Right lower extremity negative for DVT.  - Leukocytosis is a little increased today to 11.9.  Hyponatremia -Due to hypovolemia in the setting of hydrochlorothiazide use. -Hydrochlorothiazide remains on hold, continue IV fluids.  Essential hypertension -Fair control, continue current regimen.  History of SVT -Currently in sinus rhythm, continue Cardizem CD.  Hyperlipidemia -Continue statin.  Constipation -Patient states he has not had a bowel movement since admission. -He wants to restart MiraLAX and fiber supplements which he does at home.  Have ordered.  Glaucoma -Continue eyedrops.  Bereavement -Patient's brother, to whom he was very close with, recently passed away and his funeral was this week and he missed it because he was hospitalized. -He seems appropriately angry and frustrated about this. -Do not believe he needs antidepressants at this time.   DVT prophylaxis: Subcutaneous heparin Code Status: Full code Family Communication: Wife at bedside updated on plan of care and all questions answered Disposition Plan: Pending improvement in  cellulitis  Consultants:   None  Procedures:   None  Antimicrobials:  Anti-infectives (From admission, onward)   Start     Dose/Rate Route Frequency Ordered Stop   10/22/17 1400  ceFAZolin (ANCEF) IVPB 2g/100 mL premix  Status:  Discontinued     2 g 200 mL/hr over 30 Minutes Intravenous Every 8 hours 10/22/17 1123 10/25/17 1631   10/21/17 1800  cefTRIAXone (ROCEPHIN) 2 g in dextrose 5 % 50 mL IVPB  Status:  Discontinued     2 g 100 mL/hr over 30 Minutes Intravenous Every 24 hours 10/21/17 1737 10/22/17 1123   10/21/17 1630  vancomycin (VANCOCIN) IVPB 1000 mg/200 mL premix     1,000 mg 200 mL/hr over 60 Minutes Intravenous  Once 10/21/17 1629 10/21/17 1758       Subjective: Very angry today about pretty much everything: that he missed his brother's funeral, that his leg is not improving fast enough, that his leg hurts, and that it is swollen, he is not being allowed to ambulate independently, he was placed on a bed alarm which he does not like.  Objective: Vitals:   10/25/17 0706 10/25/17 0923 10/25/17 1355 10/25/17 1416  BP: (!) 159/73 (!) 148/84  136/74  Pulse: 68  79   Resp: 18     Temp: 98.8 F (37.1 C)     TempSrc: Oral     SpO2: 97%  97%   Weight:      Height:        Intake/Output Summary (Last 24 hours) at 10/25/2017 1631 Last data filed at 10/25/2017 1607 Gross per 24 hour  Intake 1775 ml  Output 6000 ml  Net -4225 ml   [employer]  10/21/17 1207 10/21/17 2056  Weight: 104.3 kg (230 lb) 107 kg (235 lb 14.3 oz)    Examination:  General exam: Alert, awake, oriented x 3 Respiratory system: Clear to auscultation. Respiratory effort normal. Cardiovascular system:RRR. No murmurs, rubs, gallops. Gastrointestinal system: Abdomen is nondistended, soft and nontender. No organomegaly or masses felt. Normal bowel sounds heard. Central nervous system: Alert and oriented. No focal neurological deficits. Extremities: Left: No clubbing, cyanosis or edema; right:  See picture            Psychiatry: Judgement and insight appear normal.  Mood is angry and frustrated.    Data Reviewed: I have personally reviewed following labs and imaging studies  CBC: Recent Labs  Lab 10/19/17 1453 10/21/17 1314 10/22/17 1202 10/23/17 0538 10/24/17 0538 10/25/17 0538  WBC 20.4* 18.2* 12.2* 12.9* 10.6* 11.9*  NEUTROABS 18.5* 16.6*  --   --   --   --   HGB 13.0 13.5 12.3* 11.2* 11.6* 11.2*  HCT 38.8* 39.6 37.3* 33.7* 33.8* 33.0*  MCV 90.9 90.2 90.3 89.4 88.0 88.9  PLT 396 314 299 328 344 989*   Basic Metabolic Panel: Recent Labs  Lab 10/19/17 1453 10/21/17 1314 10/22/17 0545 10/24/17 0538 10/25/17 0538  NA 132* 125* 127* 128* 128*  K 3.7 3.9 3.8 3.8 4.0  CL 98* 90* 96* 97* 97*  CO2 22 26 22 22 22   GLUCOSE 95 94 94 97 95  BUN 13 16 12 8 7   CREATININE 0.92 0.99 0.85 0.75 0.68  CALCIUM 9.0 9.4 8.2* 8.2* 7.9*   GFR: Estimated Creatinine Clearance: 88.8 mL/min (by C-G formula based on SCr of 0.68 mg/dL). Liver Function Tests: Recent Labs  Lab 10/19/17 1453 10/21/17 1314  AST 40 37  ALT 29 57  ALKPHOS 75 83  BILITOT 0.1* 0.3  PROT 7.2 7.5  ALBUMIN 3.7 3.4*   No results for input(s): LIPASE, AMYLASE in the last 168 hours. No results for input(s): AMMONIA in the last 168 hours. Coagulation Profile: No results for input(s): INR, PROTIME in the last 168 hours. Cardiac Enzymes: Recent Labs  Lab 10/22/17 1202  CKTOTAL 16*   BNP (last 3 results) No results for input(s): PROBNP in the last 8760 hours. HbA1C: No results for input(s): HGBA1C in the last 72 hours. CBG: No results for input(s): GLUCAP in the last 168 hours. Lipid Profile: No results for input(s): CHOL, HDL, LDLCALC, TRIG, CHOLHDL, LDLDIRECT in the last 72 hours. Thyroid Function Tests: No results for input(s): TSH, T4TOTAL, FREET4, T3FREE, THYROIDAB in the last 72 hours. Anemia Panel: No results for input(s): VITAMINB12, FOLATE, FERRITIN, TIBC, IRON, RETICCTPCT  in the last 72 hours. Urine analysis:    Component Value Date/Time   COLORURINE YELLOW 10/19/2017 1510   APPEARANCEUR CLEAR 10/19/2017 1510   LABSPEC 1.012 10/19/2017 1510   PHURINE 6.0 10/19/2017 1510   GLUCOSEU NEGATIVE 10/19/2017 1510   HGBUR NEGATIVE 10/19/2017 1510   HGBUR negative 10/27/2008 0838   BILIRUBINUR NEGATIVE 10/19/2017 1510   KETONESUR NEGATIVE 10/19/2017 1510   PROTEINUR NEGATIVE 10/19/2017 1510   UROBILINOGEN 0.2 01/11/2014 1405   NITRITE NEGATIVE 10/19/2017 1510   LEUKOCYTESUR TRACE (A) 10/19/2017 1510   Sepsis Labs: @LABRCNTIP (procalcitonin:4,lacticidven:4)  ) Recent Results (from the past 240 hour(s))  Blood culture (routine x 2)     Status: None (Preliminary result)   Collection Time: 10/21/17  1:14 PM  Result Value Ref Range Status   Specimen Description BLOOD RIGHT ARM  Final   Special Requests   Final  BOTTLES DRAWN AEROBIC AND ANAEROBIC Blood Culture adequate volume   Culture NO GROWTH 4 DAYS  Final   Report Status PENDING  Incomplete  Blood culture (routine x 2)     Status: None (Preliminary result)   Collection Time: 10/21/17  4:57 PM  Result Value Ref Range Status   Specimen Description BLOOD RIGHT HAND  Final   Special Requests   Final    BOTTLES DRAWN AEROBIC AND ANAEROBIC Blood Culture results may not be optimal due to an inadequate volume of blood received in culture bottles   Culture NO GROWTH 4 DAYS  Final   Report Status PENDING  Incomplete         Radiology Studies: No results found.      Scheduled Meds: . diltiazem  120 mg Oral Daily  . heparin  5,000 Units Subcutaneous Q8H  . hydrALAZINE  25 mg Oral TID  . latanoprost  1 drop Both Eyes QHS  . LORazepam  1 mg Oral QHS  . losartan  100 mg Oral q morning - 10a  . pantoprazole  40 mg Oral Daily  . polyethylene glycol  17 g Oral Daily  . pravastatin  40 mg Oral QHS  . psyllium  1 packet Oral BID   Continuous Infusions: . 0.9 % NaCl with KCl 20 mEq / L 75 mL/hr at  10/25/17 0924     LOS: 4 days    Time spent: 25 minutes. Greater than 50% of this time was spent in direct contact with the patient coordinating care.     Lelon Frohlich, MD [facility] Pager [phone]  If 7PM-7AM, please contact night-coverage [email or web address] Password [facility] 10/25/2017, 4:31 PM

## 2017-10-26 LAB — BASIC METABOLIC PANEL
ANION GAP: 10 (ref 5–15)
BUN: 8 mg/dL (ref 6–20)
CALCIUM: 8.2 mg/dL — AB (ref 8.9–10.3)
CO2: 23 mmol/L (ref 22–32)
Chloride: 96 mmol/L — ABNORMAL LOW (ref 101–111)
Creatinine, Ser: 0.73 mg/dL (ref 0.61–1.24)
GFR calc Af Amer: >60 mL/min (ref >60)
Glucose, Bld: 103 mg/dL — ABNORMAL HIGH (ref 65–99)
POTASSIUM: 3.7 mmol/L (ref 3.5–5.1)
SODIUM: 129 mmol/L — AB (ref 135–145)

## 2017-10-26 LAB — CULTURE, BLOOD (ROUTINE X 2)
CULTURE: NO GROWTH
Culture: NO GROWTH
SPECIAL REQUESTS: ADEQUATE

## 2017-10-26 LAB — CBC
HEMATOCRIT: 33.3 % — AB (ref 39.0–52.0)
HEMOGLOBIN: 11.2 g/dL — AB (ref 13.0–17.0)
MCH: 29.9 pg (ref 26.0–34.0)
MCHC: 33.6 g/dL (ref 30.0–36.0)
MCV: 88.8 fL (ref 78.0–100.0)
Platelets: 500 10*3/uL — ABNORMAL HIGH (ref 150–400)
RBC: 3.75 MIL/uL — ABNORMAL LOW (ref 4.22–5.81)
RDW: 13.5 % (ref 11.5–15.5)
WBC: 12.7 10*3/uL — AB (ref 4.0–10.5)

## 2017-10-26 MED ORDER — IBUPROFEN 400 MG PO TABS
400.0000 mg | ORAL_TABLET | Freq: Three times a day (TID) | ORAL | Status: DC | PRN
  Administered 2017-10-26: 400 mg via ORAL
  Filled 2017-10-26: qty 1

## 2017-10-26 MED ORDER — OXYCODONE HCL 5 MG PO TABS
5.0000 mg | ORAL_TABLET | Freq: Four times a day (QID) | ORAL | Status: DC | PRN
Start: 2017-10-26 — End: 2017-10-30
  Administered 2017-10-26 – 2017-10-27 (×3): 5 mg via ORAL
  Administered 2017-10-27: 10 mg via ORAL
  Administered 2017-10-27 – 2017-10-30 (×9): 5 mg via ORAL
  Filled 2017-10-26 (×6): qty 1
  Filled 2017-10-26: qty 2
  Filled 2017-10-26 (×6): qty 1

## 2017-10-26 NOTE — Progress Notes (Signed)
PROGRESS NOTE    Julian Fowler  UDJ:[id number] DOB: [birth year] DOA: 10/21/2017 PCP: [name], MD     Brief Narrative:  83 year old man admitted from home on 1/7 due to pain and swelling of his right leg that began on 1/5.  Admitted with right lower extremity cellulitis.  Has only had minimal improvement, Transitioned over to broad-spectrum antibiotics on 1/11.   Assessment & Plan:   Principal Problem:   Cellulitis Active Problems:   Asthma, mild intermittent, well-controlled   Essential hypertension   OSA (obstructive sleep apnea)   Cardiac murmur   Right lower extremity cellulitis -With some improvement overnight (see pictures) -Was transtioned over to broad-spectrum abx (vanc and zosyn) on 1/11. Plan to continue today. -Right lower extremity US negative for DVT.  -Discussed with patient  CT scan of leg. I do not believe it will provide [place] with additional information as I do not believe he has an abscess that would require drainage. He is already on broad spectrum abx therapy. Will defer at this point. If becomes febrile, or WBCs continue to increase, can consider ordering.  Hyponatremia -Due to hypovolemia in the setting of hydrochlorothiazide use. -Hydrochlorothiazide remains on hold. -Will hold IVF as he is starting to develop some edema. -Per chart review, baseline Na levels appear to be around 129-132.  Essential hypertension -Fair control, continue current regimen.  History of SVT -Currently in sinus rhythm, continue Cardizem CD.  Hyperlipidemia -Continue statin.  Constipation -Was able to have a BM with fiber/miralax ordered.  Glaucoma -Continue eyedrops.  Bereavement -Patient's brother, to whom he was very close with, recently passed away and his funeral was this week and he missed it because he was hospitalized. -He seems appropriately angry and frustrated about this. -Do not believe he needs antidepressants at this time.   DVT prophylaxis:  Subcutaneous heparin Code Status: Full code Family Communication: Wife at bedside updated on plan of care and all questions answered Disposition Plan: Pending improvement in cellulitis  Consultants:   None  Procedures:   None  Antimicrobials:  Anti-infectives (From admission, onward)   Start     Dose/Rate Route Frequency Ordered Stop   10/26/17 0600  vancomycin (VANCOCIN) IVPB 750 mg/150 ml premix     750 mg 150 mL/hr over 60 Minutes Intravenous Every 12 hours 10/25/17 1700     10/25/17 1800  vancomycin (VANCOCIN) 2,000 mg in sodium chloride 0.9 % 500 mL IVPB     2,000 mg 250 mL/hr over 120 Minutes Intravenous  Once 10/25/17 1700 10/26/17 0101   10/25/17 1715  piperacillin-tazobactam (ZOSYN) IVPB 3.375 g     3.375 g 12.5 mL/hr over 240 Minutes Intravenous Every 8 hours 10/25/17 1700     10/22/17 1400  ceFAZolin (ANCEF) IVPB 2g/100 mL premix  Status:  Discontinued     2 g 200 mL/hr over 30 Minutes Intravenous Every 8 hours 10/22/17 1123 10/25/17 1631   10/21/17 1800  cefTRIAXone (ROCEPHIN) 2 g in dextrose 5 % 50 mL IVPB  Status:  Discontinued     2 g 100 mL/hr over 30 Minutes Intravenous Every 24 hours 10/21/17 1737 10/22/17 1123   10/21/17 1630  vancomycin (VANCOCIN) IVPB 1000 mg/200 mL premix     1,000 mg 200 mL/hr over 60 Minutes Intravenous  Once 10/21/17 1629 10/21/17 1758       Subjective: Has complaints about many things. Denies fever, states he has had increased pain in his foot today "feels like someone is stabbing it".  Objective: Vitals:   10/25/17 1416 10/25/17 2053 10/26/17 0604 10/26/17 1312  BP: 136/74 (!) 158/54 (!) 150/59   Pulse:  73 62   Resp:  18 20   Temp:  100.3 F (37.9 C) 98.4 F (36.9 C) 98.3 F (36.8 C)  TempSrc:  Other (Comment) Oral Oral  SpO2:  96% 93%   Weight:      Height:        Intake/Output Summary (Last 24 hours) at 10/26/2017 1347 Last data filed at 10/26/2017 1343 Gross per 24 hour  Intake 3910 ml  Output 4500 ml  Net  -590 ml   Filed Weights   10/21/17 1207 10/21/17 2056  Weight: 104.3 kg (230 lb) 107 kg (235 lb 14.3 oz)    Examination:  General exam: Alert, awake, oriented x 3 Respiratory system: Clear to auscultation. Respiratory effort normal. Cardiovascular system:RRR. No murmurs, rubs, gallops. Gastrointestinal system: Abdomen is nondistended, soft and nontender. No organomegaly or masses felt. Normal bowel sounds heard. Central nervous system: Alert and oriented. No focal neurological deficits. Extremities: Left: No clubbing, cyanosis or edema; right: See picture                Psychiatry: Judgement and insight appear normal.  Mood is angry and frustrated.    Data Reviewed: I have personally reviewed following labs and imaging studies  CBC: Recent Labs  Lab 10/19/17 1453 10/21/17 1314 10/22/17 1202 10/23/17 0538 10/24/17 0538 10/25/17 0538 10/26/17 0623  WBC 20.4* 18.2* 12.2* 12.9* 10.6* 11.9* 12.7*  NEUTROABS 18.5* 16.6*  --   --   --   --   --   HGB 13.0 13.5 12.3* 11.2* 11.6* 11.2* 11.2*  HCT 38.8* 39.6 37.3* 33.7* 33.8* 33.0* 33.3*  MCV 90.9 90.2 90.3 89.4 88.0 88.9 88.8  PLT 396 314 299 328 344 417* 710*   Basic Metabolic Panel: Recent Labs  Lab 10/21/17 1314 10/22/17 0545 10/24/17 0538 10/25/17 0538 10/26/17 0623  NA 125* 127* 128* 128* 129*  K 3.9 3.8 3.8 4.0 3.7  CL 90* 96* 97* 97* 96*  CO2 26 22 22 22 23   GLUCOSE 94 94 97 95 103*  BUN 16 12 8 7 8   CREATININE 0.99 0.85 0.75 0.68 0.73  CALCIUM 9.4 8.2* 8.2* 7.9* 8.2*   GFR: Estimated Creatinine Clearance: 88.8 mL/min (by C-G formula based on SCr of 0.73 mg/dL). Liver Function Tests: Recent Labs  Lab 10/19/17 1453 10/21/17 1314  AST 40 37  ALT 29 57  ALKPHOS 75 83  BILITOT 0.1* 0.3  PROT 7.2 7.5  ALBUMIN 3.7 3.4*   No results for input(s): LIPASE, AMYLASE in the last 168 hours. No results for input(s): AMMONIA in the last 168 hours. Coagulation Profile: No results for input(s): INR,  PROTIME in the last 168 hours. Cardiac Enzymes: Recent Labs  Lab 10/22/17 1202  CKTOTAL 16*   BNP (last 3 results) No results for input(s): PROBNP in the last 8760 hours. HbA1C: No results for input(s): HGBA1C in the last 72 hours. CBG: No results for input(s): GLUCAP in the last 168 hours. Lipid Profile: No results for input(s): CHOL, HDL, LDLCALC, TRIG, CHOLHDL, LDLDIRECT in the last 72 hours. Thyroid Function Tests: No results for input(s): TSH, T4TOTAL, FREET4, T3FREE, THYROIDAB in the last 72 hours. Anemia Panel: No results for input(s): VITAMINB12, FOLATE, FERRITIN, TIBC, IRON, RETICCTPCT in the last 72 hours. Urine analysis:    Component Value Date/Time   COLORURINE YELLOW 10/19/2017 [place] 10/19/2017 1510  LABSPEC 1.012 10/19/2017 1510   PHURINE 6.0 10/19/2017 1510   GLUCOSEU NEGATIVE 10/19/2017 1510   HGBUR NEGATIVE 10/19/2017 1510   HGBUR negative 10/27/2008 0838   BILIRUBINUR NEGATIVE 10/19/2017 [place] 10/19/2017 1510   PROTEINUR NEGATIVE 10/19/2017 1510   UROBILINOGEN 0.2 01/11/2014 1405   NITRITE NEGATIVE 10/19/2017 1510   LEUKOCYTESUR TRACE (A) 10/19/2017 1510   Sepsis Labs: @LABRCNTIP (procalcitonin:4,lacticidven:4)  ) Recent Results (from the past 240 hour(s))  Blood culture (routine x 2)     Status: None   Collection Time: 10/21/17  1:14 PM  Result Value Ref Range Status   Specimen Description BLOOD RIGHT ARM  Final   Special Requests   Final    BOTTLES DRAWN AEROBIC AND ANAEROBIC Blood Culture adequate volume   Culture NO GROWTH 5 DAYS  Final   Report Status 10/26/2017 FINAL  Final  Blood culture (routine x 2)     Status: None   Collection Time: 10/21/17  4:57 PM  Result Value Ref Range Status   Specimen Description BLOOD RIGHT HAND  Final   Special Requests   Final    BOTTLES DRAWN AEROBIC AND ANAEROBIC Blood Culture results may not be optimal due to an inadequate volume of blood received in culture bottles    Culture NO GROWTH 5 DAYS  Final   Report Status 10/26/2017 FINAL  Final         Radiology Studies: No results found.      Scheduled Meds: . diltiazem  120 mg Oral Daily  . heparin  5,000 Units Subcutaneous Q8H  . hydrALAZINE  25 mg Oral TID  . latanoprost  1 drop Both Eyes QHS  . LORazepam  1 mg Oral QHS  . losartan  100 mg Oral q morning - 10a  . pantoprazole  40 mg Oral Daily  . polyethylene glycol  17 g Oral Daily  . pravastatin  40 mg Oral QHS  . psyllium  1 packet Oral BID   Continuous Infusions: . 0.9 % NaCl with KCl 20 mEq / L 75 mL/hr at 10/26/17 0556  . piperacillin-tazobactam (ZOSYN)  IV 3.375 g (10/26/17 1343)  . vancomycin 750 mg (10/26/17 0556)     LOS: 5 days    Time spent: 25 minutes. Greater than 50% of this time was spent in direct contact with the patient coordinating care.     Lelon Frohlich, MD [facility] Pager [phone]  If 7PM-7AM, please contact night-coverage [email or web address] Password [email or web address] 10/26/2017, 1:47 PM

## 2017-10-27 NOTE — Progress Notes (Signed)
PROGRESS NOTE    Julian Fowler  WEX:[id number] DOB: [birth year] DOA: 10/21/2017 PCP: [name], MD     Brief Narrative:  83 year old man admitted from home on 1/7 due to pain and swelling of his right leg that began on 1/5.  Admitted with right lower extremity cellulitis.  Has only had minimal improvement, Transitioned over to broad-spectrum antibiotics on 1/11.   Assessment & Plan:   Principal Problem:   Cellulitis Active Problems:   Asthma, mild intermittent, well-controlled   Essential hypertension   OSA (obstructive sleep apnea)   Cardiac murmur   Right lower extremity cellulitis -With significant improvement overnight (see pictures) -Was transtioned over to broad-spectrum abx (vanc and zosyn) on 1/11. Plan to continue today. -Right lower extremity US negative for DVT.  -Discussed with patient  CT scan of leg on 1/12: I do not believe it will provide [place] with additional information as I do not believe he has an abscess that would require drainage. He is already on broad spectrum abx therapy. Will defer at this point. If becomes febrile, or WBCs continue to increase, can consider ordering. -Try to use tylenol and ibuprofen preferentially for pain; may use oxy IR for severe breakthru pain. Has become groggy with oxy IR.  Hyponatremia -Due to hypovolemia in the setting of hydrochlorothiazide use. -Hydrochlorothiazide remains on hold. -Will hold IVF as he is starting to develop some edema. -Per chart review, baseline Na levels appear to be around 129-132.  Essential hypertension -Fair control, continue current regimen.  History of SVT -Currently in sinus rhythm, continue Cardizem CD.  Hyperlipidemia -Continue statin.  Constipation -Was able to have a BM with fiber/miralax ordered.  Glaucoma -Continue eyedrops.  Bereavement -Patient's brother, to whom he was very close with, recently passed away and his funeral was this week and he missed it because he was  hospitalized. -He seems appropriately angry and frustrated about this. -Do not believe he needs antidepressants at this time.   DVT prophylaxis: Subcutaneous heparin Code Status: Full code Family Communication: Wife at bedside updated on plan of care and all questions answered Disposition Plan: Pending improvement in cellulitis  Consultants:   None  Procedures:   None  Antimicrobials:  Anti-infectives (From admission, onward)   Start     Dose/Rate Route Frequency Ordered Stop   10/26/17 0600  vancomycin (VANCOCIN) IVPB 750 mg/150 ml premix     750 mg 150 mL/hr over 60 Minutes Intravenous Every 12 hours 10/25/17 1700     10/25/17 1800  vancomycin (VANCOCIN) 2,000 mg in sodium chloride 0.9 % 500 mL IVPB     2,000 mg 250 mL/hr over 120 Minutes Intravenous  Once 10/25/17 1700 10/26/17 0101   10/25/17 1715  piperacillin-tazobactam (ZOSYN) IVPB 3.375 g     3.375 g 12.5 mL/hr over 240 Minutes Intravenous Every 8 hours 10/25/17 1700     10/22/17 1400  ceFAZolin (ANCEF) IVPB 2g/100 mL premix  Status:  Discontinued     2 g 200 mL/hr over 30 Minutes Intravenous Every 8 hours 10/22/17 1123 10/25/17 1631   10/21/17 1800  cefTRIAXone (ROCEPHIN) 2 g in dextrose 5 % 50 mL IVPB  Status:  Discontinued     2 g 100 mL/hr over 30 Minutes Intravenous Every 24 hours 10/21/17 1737 10/22/17 1123   10/21/17 1630  vancomycin (VANCOCIN) IVPB 1000 mg/200 mL premix     1,000 mg 200 mL/hr over 60 Minutes Intravenous  Once 10/21/17 1629 10/21/17 1758  Subjective: Felt very weak and groggy while having a shower today. Felt like he was going to pass out.  Objective: Vitals:   10/26/17 1312 10/26/17 1606 10/26/17 2056 10/27/17 0513  BP:  (!) 168/68 (!) 155/66 (!) 168/65  Pulse:  66 65 73  Resp:  16 16 18   Temp: 98.3 F (36.8 C) 98.4 F (36.9 C) 97.6 F (36.4 C) 99.5 F (37.5 C)  TempSrc: Oral Oral Oral Oral  SpO2:  97% 97% 95%  Weight:      Height:        Intake/Output Summary (Last  24 hours) at 10/27/2017 1258 Last data filed at 10/27/2017 1206 Gross per 24 hour  Intake 1821.25 ml  Output 4050 ml  Net -2228.75 ml   Filed Weights   10/21/17 1207 10/21/17 2056  Weight: 104.3 kg (230 lb) 107 kg (235 lb 14.3 oz)    Examination:  General exam: Alert, awake, oriented x 3 Respiratory system: Clear to auscultation. Respiratory effort normal. Cardiovascular system:RRR. No murmurs, rubs, gallops. Gastrointestinal system: Abdomen is nondistended, soft and nontender. No organomegaly or masses felt. Normal bowel sounds heard. Central nervous system: Alert and oriented. No focal neurological deficits. Extremities: Left: No clubbing, cyanosis or edema; right: See picture                    Psychiatry: Judgement and insight appear normal.  Mood is angry and frustrated.    Data Reviewed: I have personally reviewed following labs and imaging studies  CBC: Recent Labs  Lab 10/21/17 1314 10/22/17 1202 10/23/17 0538 10/24/17 0538 10/25/17 0538 10/26/17 0623  WBC 18.2* 12.2* 12.9* 10.6* 11.9* 12.7*  NEUTROABS 16.6*  --   --   --   --   --   HGB 13.5 12.3* 11.2* 11.6* 11.2* 11.2*  HCT 39.6 37.3* 33.7* 33.8* 33.0* 33.3*  MCV 90.2 90.3 89.4 88.0 88.9 88.8  PLT 314 299 328 344 417* 419*   Basic Metabolic Panel: Recent Labs  Lab 10/21/17 1314 10/22/17 0545 10/24/17 0538 10/25/17 0538 10/26/17 0623  NA 125* 127* 128* 128* 129*  K 3.9 3.8 3.8 4.0 3.7  CL 90* 96* 97* 97* 96*  CO2 26 22 22 22 23   GLUCOSE 94 94 97 95 103*  BUN 16 12 8 7 8   CREATININE 0.99 0.85 0.75 0.68 0.73  CALCIUM 9.4 8.2* 8.2* 7.9* 8.2*   GFR: Estimated Creatinine Clearance: 88.8 mL/min (by C-G formula based on SCr of 0.73 mg/dL). Liver Function Tests: Recent Labs  Lab 10/21/17 1314  AST 37  ALT 57  ALKPHOS 83  BILITOT 0.3  PROT 7.5  ALBUMIN 3.4*   No results for input(s): LIPASE, AMYLASE in the last 168 hours. No results for input(s): AMMONIA in the last 168  hours. Coagulation Profile: No results for input(s): INR, PROTIME in the last 168 hours. Cardiac Enzymes: Recent Labs  Lab 10/22/17 1202  CKTOTAL 16*   BNP (last 3 results) No results for input(s): PROBNP in the last 8760 hours. HbA1C: No results for input(s): HGBA1C in the last 72 hours. CBG: No results for input(s): GLUCAP in the last 168 hours. Lipid Profile: No results for input(s): CHOL, HDL, LDLCALC, TRIG, CHOLHDL, LDLDIRECT in the last 72 hours. Thyroid Function Tests: No results for input(s): TSH, T4TOTAL, FREET4, T3FREE, THYROIDAB in the last 72 hours. Anemia Panel: No results for input(s): VITAMINB12, FOLATE, FERRITIN, TIBC, IRON, RETICCTPCT in the last 72 hours. Urine analysis:    Component Value Date/Time  COLORURINE YELLOW 10/19/2017 [place] 10/19/2017 1510   LABSPEC 1.012 10/19/2017 1510   PHURINE 6.0 10/19/2017 1510   GLUCOSEU NEGATIVE 10/19/2017 1510   HGBUR NEGATIVE 10/19/2017 1510   HGBUR negative 10/27/2008 0838   BILIRUBINUR NEGATIVE 10/19/2017 1510   KETONESUR NEGATIVE 10/19/2017 1510   PROTEINUR NEGATIVE 10/19/2017 1510   UROBILINOGEN 0.2 01/11/2014 1405   NITRITE NEGATIVE 10/19/2017 1510   LEUKOCYTESUR TRACE (A) 10/19/2017 1510   Sepsis Labs: @LABRCNTIP (procalcitonin:4,lacticidven:4)  ) Recent Results (from the past 240 hour(s))  Blood culture (routine x 2)     Status: None   Collection Time: 10/21/17  1:14 PM  Result Value Ref Range Status   Specimen Description BLOOD RIGHT ARM  Final   Special Requests   Final    BOTTLES DRAWN AEROBIC AND ANAEROBIC Blood Culture adequate volume   Culture NO GROWTH 5 DAYS  Final   Report Status 10/26/2017 FINAL  Final  Blood culture (routine x 2)     Status: None   Collection Time: 10/21/17  4:57 PM  Result Value Ref Range Status   Specimen Description BLOOD RIGHT HAND  Final   Special Requests   Final    BOTTLES DRAWN AEROBIC AND ANAEROBIC Blood Culture results may not be optimal due  to an inadequate volume of blood received in culture bottles   Culture NO GROWTH 5 DAYS  Final   Report Status 10/26/2017 FINAL  Final         Radiology Studies: No results found.      Scheduled Meds: . diltiazem  120 mg Oral Daily  . heparin  5,000 Units Subcutaneous Q8H  . hydrALAZINE  25 mg Oral TID  . latanoprost  1 drop Both Eyes QHS  . LORazepam  1 mg Oral QHS  . losartan  100 mg Oral q morning - 10a  . pantoprazole  40 mg Oral Daily  . polyethylene glycol  17 g Oral Daily  . pravastatin  40 mg Oral QHS  . psyllium  1 packet Oral BID   Continuous Infusions: . piperacillin-tazobactam (ZOSYN)  IV Stopped (10/27/17 1000)  . vancomycin Stopped (10/27/17 0705)     LOS: 6 days    Time spent: 25 minutes. Greater than 50% of this time was spent in direct contact with the patient coordinating care.     Lelon Frohlich, MD [facility] Pager [phone]  If 7PM-7AM, please contact night-coverage [email or web address] Password [facility] 10/27/2017, 12:58 PM

## 2017-10-28 LAB — BASIC METABOLIC PANEL
Anion gap: 8 (ref 5–15)
BUN: 8 mg/dL (ref 6–20)
CALCIUM: 8.2 mg/dL — AB (ref 8.9–10.3)
CO2: 25 mmol/L (ref 22–32)
Chloride: 96 mmol/L — ABNORMAL LOW (ref 101–111)
Creatinine, Ser: 0.76 mg/dL (ref 0.61–1.24)
Glucose, Bld: 94 mg/dL (ref 65–99)
Potassium: 4 mmol/L (ref 3.5–5.1)
SODIUM: 129 mmol/L — AB (ref 135–145)

## 2017-10-28 LAB — CBC
HCT: 32.5 % — ABNORMAL LOW (ref 39.0–52.0)
HEMOGLOBIN: 10.8 g/dL — AB (ref 13.0–17.0)
MCH: 29.7 pg (ref 26.0–34.0)
MCHC: 33.2 g/dL (ref 30.0–36.0)
MCV: 89.3 fL (ref 78.0–100.0)
PLATELETS: 588 10*3/uL — AB (ref 150–400)
RBC: 3.64 MIL/uL — AB (ref 4.22–5.81)
RDW: 13.3 % (ref 11.5–15.5)
WBC: 11.4 10*3/uL — ABNORMAL HIGH (ref 4.0–10.5)

## 2017-10-28 LAB — VANCOMYCIN, TROUGH: VANCOMYCIN TR: 12 ug/mL — AB (ref 15–20)

## 2017-10-28 MED ORDER — ACETAMINOPHEN 325 MG PO TABS: 650.0000 mg | ORAL_TABLET | Freq: Four times a day (QID) | ORAL | Status: DC | PRN

## 2017-10-28 NOTE — Progress Notes (Signed)
Pharmacy Antibiotic Note  Julian Fowler is a 83 y.o. male admitted on 10/21/2017 with cellulitis.  Pharmacy has been consulted for Vancomycin and zosyn dosing. VT reported as 16mcg/ml, therapeutic. Now with significant improvement, per MD, of cellulitis. Day #4 of Vancomycin.  Plan: Continue Vancomycin  750mg   IV every 12 hours.  Goal trough 10-15 mcg/mL. Zosyn 3.375g IV q8h (4 hour infusion). Deescalate tx as indicated F/Ucxs and clinical progress Monitor V/S, labs, and levels as indicated  Height: 5\' 9"  (175.3 cm) Weight: 235 lb 14.3 oz (107 kg) IBW/kg (Calculated) : 70.7  Temp (24hrs), Avg:98.8 F (37.1 C), Min:98.5 F (36.9 C), Max:99.2 F (37.3 C)  Recent Labs  Lab 10/21/17 1314 10/22/17 0545  10/23/17 0538 10/24/17 0538 10/25/17 0538 10/26/17 0623 10/28/17 0453  WBC 18.2*  --    < > 12.9* 10.6* 11.9* 12.7* 11.4*  CREATININE 0.99 0.85  --   --  0.75 0.68 0.73 0.76  LATICACIDVEN 1.0  --   --   --   --   --   --   --   VANCOTROUGH  --   --   --   --   --   --   --  12*   < > = values in this interval not displayed.   Normalized CrCL is 47mls/min Estimated Creatinine Clearance: 88.8 mL/min (by C-G formula based on SCr of 0.76 mg/dL).    Allergies  Allergen Reactions  . Codeine Other (See Comments)    REACTION: groggy  . Tape Other (See Comments)    Skin tears, use paper tape    Antimicrobials this admission: Vancomycin x 1 dose 1/7 now restart 1/11>>  Zosyn 1/11 >> Cefazolin 1/8>>1/11 Ceftriaxone 1/7>>1/8   Dose adjustments this admission: N/A  Microbiology results: 1/7 BCx: ngtd  Thank you for allowing pharmacy to be a part of this patient's care.  Isac Sarna, BS Pharm D, [place] Clinical Pharmacist Pager [phone] 10/28/2017 8:18 AM

## 2017-10-28 NOTE — Progress Notes (Signed)
PROGRESS NOTE    Julian Fowler  SJG:[id number] DOB: January 16, 1935 DOA: 10/21/2017 PCP: [name], MD     Brief Narrative:  83 year old man admitted from home on 1/7 due to pain and swelling of his right leg that began on 1/5.  Admitted with right lower extremity cellulitis.  Since transitioned over to broad-spectrum antibiotics on 1/11 has showed significant improvement.   Assessment & Plan:   Principal Problem:   Cellulitis Active Problems:   Asthma, mild intermittent, well-controlled   Essential hypertension   OSA (obstructive sleep apnea)   Cardiac murmur   Right lower extremity cellulitis -With significant improvement overnight (see pictures) -Was transtioned over to broad-spectrum abx (vanc and zosyn) on 1/11. Plan to continue today and possibly transition to oral antibiotics in 24 hours pending clinical response. -Right lower extremity US negative for DVT.  -Discussed with patient  CT scan of leg on 1/12: I do not believe it will provide [place] with additional information as I do not believe he has an abscess that would require drainage. He is already on broad spectrum abx therapy. Will defer at this point. If becomes febrile, or WBCs continue to increase, can consider ordering. -Try to use tylenol and ibuprofen preferentially for pain; may use oxy IR for severe breakthru pain. Has become groggy with oxy IR.  Hyponatremia -Due to hypovolemia in the setting of hydrochlorothiazide use. -Hydrochlorothiazide remains on hold. -Will hold IVF as he is starting to develop some edema. -Per chart review, baseline Na levels appear to be around 129-132.  Essential hypertension -Fair control, continue current regimen.  History of SVT -Currently in sinus rhythm, continue Cardizem CD.  Hyperlipidemia -Continue statin.  Constipation -Was able to have a BM with fiber/miralax ordered.  Glaucoma -Continue eyedrops.  Bereavement -Patient's brother, to whom he was very close  with, recently passed away and his funeral was this week and he missed it because he was hospitalized. -He seems appropriately angry and frustrated about this. -Do not believe he needs antidepressants at this time.   DVT prophylaxis: Subcutaneous heparin Code Status: Full code Family Communication: Wife at bedside updated on plan of care and all questions answered Disposition Plan: Pending improvement in cellulitis, hope for 48 hours  Consultants:   None  Procedures:   None  Antimicrobials:  Anti-infectives (From admission, onward)   Start     Dose/Rate Route Frequency Ordered Stop   10/26/17 0600  vancomycin (VANCOCIN) IVPB 750 mg/150 ml premix     750 mg 150 mL/hr over 60 Minutes Intravenous Every 12 hours 10/25/17 1700     10/25/17 1800  vancomycin (VANCOCIN) 2,000 mg in sodium chloride 0.9 % 500 mL IVPB     2,000 mg 250 mL/hr over 120 Minutes Intravenous  Once 10/25/17 1700 10/26/17 0101   10/25/17 1715  piperacillin-tazobactam (ZOSYN) IVPB 3.375 g     3.375 g 12.5 mL/hr over 240 Minutes Intravenous Every 8 hours 10/25/17 1700     10/22/17 1400  ceFAZolin (ANCEF) IVPB 2g/100 mL premix  Status:  Discontinued     2 g 200 mL/hr over 30 Minutes Intravenous Every 8 hours 10/22/17 1123 10/25/17 1631   10/21/17 1800  cefTRIAXone (ROCEPHIN) 2 g in dextrose 5 % 50 mL IVPB  Status:  Discontinued     2 g 100 mL/hr over 30 Minutes Intravenous Every 24 hours 10/21/17 1737 10/22/17 1123   10/21/17 1630  vancomycin (VANCOCIN) IVPB 1000 mg/200 mL premix     1,000 mg 200 mL/hr  over 60 Minutes Intravenous  Once 10/21/17 1629 10/21/17 1758       Subjective: In good spirits, pleasant, is trying to minimize oxycodone use as he got very groggy with it yesterday.  States pain is still present but in a lesser degree, is happy that his leg looks improved today.  Objective: Vitals:   10/27/17 1308 10/27/17 2100 10/28/17 0500 10/28/17 0936  BP: (!) 153/62 (!) 162/61 (!) 156/66 (!) 145/54    Pulse: 65 66 67   Resp: 16 16 18    Temp: 98.7 F (37.1 C) 98.5 F (36.9 C) 99.2 F (37.3 C)   TempSrc: Oral Oral Oral   SpO2: 96% 96% 95%   Weight:      Height:        Intake/Output Summary (Last 24 hours) at 10/28/2017 1416 Last data filed at 10/28/2017 0700 Gross per 24 hour  Intake 880 ml  Output 3250 ml  Net -2370 ml   Filed Weights   10/21/17 1207 10/21/17 2056  Weight: 104.3 kg (230 lb) 107 kg (235 lb 14.3 oz)    Examination:  General exam: Alert, awake, oriented x 3 Respiratory system: Clear to auscultation. Respiratory effort normal. Cardiovascular system:RRR. No murmurs, rubs, gallops. Gastrointestinal system: Abdomen is nondistended, soft and nontender. No organomegaly or masses felt. Normal bowel sounds heard. Central nervous system: Alert and oriented. No focal neurological deficits. Extremities: Left: No clubbing, cyanosis or edema; right: See picture                        Psychiatry: Judgement and insight appear normal.  Mood is normal    Data Reviewed: I have personally reviewed following labs and imaging studies  CBC: Recent Labs  Lab 10/23/17 0538 10/24/17 0538 10/25/17 0538 10/26/17 0623 10/28/17 0453  WBC 12.9* 10.6* 11.9* 12.7* 11.4*  HGB 11.2* 11.6* 11.2* 11.2* 10.8*  HCT 33.7* 33.8* 33.0* 33.3* 32.5*  MCV 89.4 88.0 88.9 88.8 89.3  PLT 328 344 417* 500* 546*   Basic Metabolic Panel: Recent Labs  Lab 10/22/17 0545 10/24/17 0538 10/25/17 0538 10/26/17 0623 10/28/17 0453  NA 127* 128* 128* 129* 129*  K 3.8 3.8 4.0 3.7 4.0  CL 96* 97* 97* 96* 96*  CO2 22 22 22 23 25   GLUCOSE 94 97 95 103* 94  BUN 12 8 7 8 8   CREATININE 0.85 0.75 0.68 0.73 0.76  CALCIUM 8.2* 8.2* 7.9* 8.2* 8.2*   GFR: Estimated Creatinine Clearance: 88.8 mL/min (by C-G formula based on SCr of 0.76 mg/dL). Liver Function Tests: No results for input(s): AST, ALT, ALKPHOS, BILITOT, PROT, ALBUMIN in the last 168 hours. No results for  input(s): LIPASE, AMYLASE in the last 168 hours. No results for input(s): AMMONIA in the last 168 hours. Coagulation Profile: No results for input(s): INR, PROTIME in the last 168 hours. Cardiac Enzymes: Recent Labs  Lab 10/22/17 1202  CKTOTAL 16*   BNP (last 3 results) No results for input(s): PROBNP in the last 8760 hours. HbA1C: No results for input(s): HGBA1C in the last 72 hours. CBG: No results for input(s): GLUCAP in the last 168 hours. Lipid Profile: No results for input(s): CHOL, HDL, LDLCALC, TRIG, CHOLHDL, LDLDIRECT in the last 72 hours. Thyroid Function Tests: No results for input(s): TSH, T4TOTAL, FREET4, T3FREE, THYROIDAB in the last 72 hours. Anemia Panel: No results for input(s): VITAMINB12, FOLATE, FERRITIN, TIBC, IRON, RETICCTPCT in the last 72 hours. Urine analysis:    Component Value Date/Time  COLORURINE YELLOW 10/19/2017 [place] 10/19/2017 1510   LABSPEC 1.012 10/19/2017 1510   PHURINE 6.0 10/19/2017 1510   GLUCOSEU NEGATIVE 10/19/2017 1510   HGBUR NEGATIVE 10/19/2017 1510   HGBUR negative 10/27/2008 0838   BILIRUBINUR NEGATIVE 10/19/2017 1510   KETONESUR NEGATIVE 10/19/2017 1510   PROTEINUR NEGATIVE 10/19/2017 1510   UROBILINOGEN 0.2 01/11/2014 1405   NITRITE NEGATIVE 10/19/2017 1510   LEUKOCYTESUR TRACE (A) 10/19/2017 1510   Sepsis Labs: @LABRCNTIP (procalcitonin:4,lacticidven:4)  ) Recent Results (from the past 240 hour(s))  Blood culture (routine x 2)     Status: None   Collection Time: 10/21/17  1:14 PM  Result Value Ref Range Status   Specimen Description BLOOD RIGHT ARM  Final   Special Requests   Final    BOTTLES DRAWN AEROBIC AND ANAEROBIC Blood Culture adequate volume   Culture NO GROWTH 5 DAYS  Final   Report Status 10/26/2017 FINAL  Final  Blood culture (routine x 2)     Status: None   Collection Time: 10/21/17  4:57 PM  Result Value Ref Range Status   Specimen Description BLOOD RIGHT HAND  Final   Special  Requests   Final    BOTTLES DRAWN AEROBIC AND ANAEROBIC Blood Culture results may not be optimal due to an inadequate volume of blood received in culture bottles   Culture NO GROWTH 5 DAYS  Final   Report Status 10/26/2017 FINAL  Final         Radiology Studies: No results found.      Scheduled Meds: . diltiazem  120 mg Oral Daily  . heparin  5,000 Units Subcutaneous Q8H  . hydrALAZINE  25 mg Oral TID  . latanoprost  1 drop Both Eyes QHS  . LORazepam  1 mg Oral QHS  . losartan  100 mg Oral q morning - 10a  . pantoprazole  40 mg Oral Daily  . polyethylene glycol  17 g Oral Daily  . pravastatin  40 mg Oral QHS  . psyllium  1 packet Oral BID   Continuous Infusions: . piperacillin-tazobactam (ZOSYN)  IV Stopped (10/28/17 0958)  . vancomycin Stopped (10/28/17 0700)     LOS: 7 days    Time spent: 25 minutes. Greater than 50% of this time was spent in direct contact with the patient coordinating care.     Lelon Frohlich, MD [facility] Pager [phone]  If 7PM-7AM, please contact night-coverage [email or web address] Password [email or web address] 10/28/2017, 2:16 PM

## 2017-10-28 NOTE — Progress Notes (Signed)
Physical Therapy Treatment Patient Details Name: Julian Fowler MRN: [id number] DOB: [birth year] Today's Date: 10/28/2017    History of Present Illness Julian Fowler is an 83yo white male who comes to [facility] on 10/21/17 after 2d swelling, redness, and pain in RLE, associated with fever: pt admitted for cellulitis. PMH: asthma, HTN, SVT, OSA, asbestosis, bilat TKA. 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     PT Comments    Patient demonstrated increased tolerance for weightbearing on RLE after 3-4 minutes of walking (became less sensitive), took steps with excessive RLE external rotation and tolerated sitting up in chair with RLE elevated after therapy with his spouse present in room.  Patient will benefit from continued physical therapy in hospital and recommended venue below to increase strength, balance, endurance for safe ADLs and gait.   Follow Up Recommendations  Home health PT;Supervision for mobility/OOB     Equipment Recommendations  None recommended by PT    Recommendations for Other Services       Precautions / Restrictions Precautions Precautions: Fall Precaution Comments: elevate right LE when in bed.  Restrictions Weight Bearing Restrictions: No    Mobility  Bed Mobility Overal bed mobility: Needs Assistance Bed Mobility: Supine to Sit;Sit to Supine     Supine to sit: Supervision Sit to supine: Supervision   General bed mobility comments: assistance to elevate RLE onto pillow  Transfers Overall transfer level: Needs assistance Equipment used: Rolling walker (2 wheeled) Transfers: Sit to/from [facility] Sit to Stand: Min guard Stand pivot transfers: Min guard          Ambulation/Gait Ambulation/Gait assistance: Supervision Ambulation Distance (Feet): 60 Feet Assistive device: Rolling walker (2 wheeled) Gait Pattern/deviations: Step-through pattern;Decreased step length -  right;Decreased stance time - right;Decreased stride length   Gait velocity interpretation: Below normal speed for age/gender General Gait Details: slow labored steps with excessive external rotation of RLE, poor return for heel toe stepping on right foot, occasional standing rest breaks   Stairs            Wheelchair Mobility    Modified Rankin (Stroke Patients Only)       Balance Overall balance assessment: Needs assistance Sitting-balance support: No upper extremity supported;Feet supported Sitting balance-Leahy Scale: Good     Standing balance support: Bilateral upper extremity supported;During functional activity Standing balance-Leahy Scale: Fair                              Cognition Arousal/Alertness: Awake/alert Behavior During Therapy: WFL for tasks assessed/performed Overall Cognitive Status: Within Functional Limits for tasks assessed                                        Exercises General Exercises - Lower Extremity Ankle Circles/Pumps: Seated;AROM;Strengthening;Both;10 reps Long Arc Quad: Seated;AROM;Strengthening;Right;10 reps Hip Flexion/Marching: Seated;AROM;Strengthening;Both;10 reps    General Comments        Pertinent Vitals/Pain Pain Assessment: 0-10 Pain Score: 2  Pain Location: right foot/leg Pain Descriptors / Indicators: Sore;Aching(sensitive to touch) Pain Intervention(s): Limited activity within patient's tolerance;Monitored during session    Home Living                      Prior Function            PT Goals (  current goals can now be found in the care plan section) Acute Rehab PT Goals Patient Stated Goal: return home with spouse to assist PT Goal Formulation: With patient/family Time For Goal Achievement: 11/08/17 Potential to Achieve Goals: Good Progress towards PT goals: Progressing toward goals    Frequency    Min 3X/week      PT Plan Current plan remains appropriate     Co-evaluation              AM-PAC PT "6 Clicks" Daily Activity  Outcome Measure  Difficulty turning over in bed (including adjusting bedclothes, sheets and blankets)?: None   Difficulty sitting down on and standing up from a chair with arms (e.g., wheelchair, bedside commode, etc,.)?: A Little Help needed moving to and from a bed to chair (including a wheelchair)?: A Little Help needed walking in hospital room?: A Little Help needed climbing 3-5 steps with a railing? : A Lot 6 Click Score: 15    End of Session   Activity Tolerance: Patient tolerated treatment well;Patient limited by fatigue;Patient limited by pain Patient left: in chair;with call bell/phone within reach;with family/visitor present Nurse Communication: Mobility status PT Visit Diagnosis: Unsteadiness on feet (R26.81);Other abnormalities of gait and mobility (R26.89);Muscle weakness (generalized) (M62.81)     Time: 4163-8453 PT Time Calculation (min) (ACUTE ONLY): 27 min  Charges:  $Therapeutic Activity: 23-37 mins                    G Codes:       1:43 PM, November 24, 2017 Lonell Grandchild, MPT Physical Therapist with [facility] 336 [phone] office [phone] mobile phone

## 2017-10-29 MED ORDER — HYDROCOD POLST-CPM POLST ER 10-8 MG/5ML PO SUER
5.0000 mL | Freq: Once | ORAL | Status: AC
  Administered 2017-10-29: 5 mL via ORAL
  Filled 2017-10-29: qty 5

## 2017-10-29 MED ORDER — DOXYCYCLINE HYCLATE 100 MG PO TABS
100.0000 mg | ORAL_TABLET | Freq: Two times a day (BID) | ORAL | Status: DC
  Administered 2017-10-29 – 2017-10-30 (×2): 100 mg via ORAL
  Filled 2017-10-29 (×2): qty 1

## 2017-10-29 MED ORDER — AMOXICILLIN-POT CLAVULANATE 875-125 MG PO TABS
1.0000 | ORAL_TABLET | Freq: Two times a day (BID) | ORAL | Status: DC
  Administered 2017-10-29 – 2017-10-30 (×2): 1 via ORAL
  Filled 2017-10-29 (×2): qty 1

## 2017-10-29 NOTE — Progress Notes (Signed)
PROGRESS NOTE    Julian Fowler  DQQ:[id number] DOB: 04-10-1935 DOA: 10/21/2017 PCP: [name], MD     Brief Narrative:  83 year old man admitted from home on 1/7 due to pain and swelling of his right leg that began on 1/5.  Admitted with right lower extremity cellulitis.  Since transitioned over to broad-spectrum antibiotics on 1/11 has showed significant improvement.   Assessment & Plan:   Principal Problem:   Cellulitis Active Problems:   Asthma, mild intermittent, well-controlled   Essential hypertension   OSA (obstructive sleep apnea)   Cardiac murmur   Right lower extremity cellulitis -With continued improvement overnight (see pictures). -Today he has noticed some increased swelling over his right hip, I have attached a picture.  I do not believe necessarily that this represents increase in cellulitis, I think this is more distribution of fluids given he maintains his leg elevated. -Was transtioned over to broad-spectrum abx (vanc and zosyn) on 1/11.  -Plan to transition over to oral antibiotics today.  Given he truly had no significant improvement until broad-spectrum antibiotics were started, will place on doxycycline for MRSA coverage and Augmentin which would be the Zosyn equivalent. -Right lower extremity US negative for DVT.  -Discussed with patient  CT scan of leg on 1/12: I do not believe it will provide [place] with additional information as I do not believe he has an abscess that would require drainage. He is already on broad spectrum abx therapy. Will defer at this point. If becomes febrile, or WBCs continue to increase, can consider ordering. -Try to use tylenol and ibuprofen preferentially for pain; may use oxy IR for severe breakthru pain. Has become groggy with oxy IR.  Hyponatremia -Due to hypovolemia in the setting of hydrochlorothiazide use. -Hydrochlorothiazide remains on hold. -Will hold IVF as he is starting to develop some edema. -Per chart review,  baseline Na levels appear to be around 129-132.  Essential hypertension -Fair control, continue current regimen.  History of SVT -Currently in sinus rhythm, continue Cardizem CD.  Hyperlipidemia -Continue statin.  Constipation -Was able to have a BM with fiber/miralax ordered.  Glaucoma -Continue eyedrops.  Bereavement -Patient's brother, to whom he was very close with, recently passed away and his funeral was this week and he missed it because he was hospitalized. -He seems appropriately angry and frustrated about this. -Do not believe he needs antidepressants at this time.   DVT prophylaxis: Subcutaneous heparin Code Status: Full code Family Communication: Wife at bedside updated on plan of care and all questions answered Disposition Plan: Pending improvement in cellulitis, hope for discharge home in 24-48 hours.  Consultants:   None  Procedures:   None  Antimicrobials:  Anti-infectives (From admission, onward)   Start     Dose/Rate Route Frequency Ordered Stop   10/29/17 2200  doxycycline (VIBRA-TABS) tablet 100 mg     100 mg Oral Every 12 hours 10/29/17 1716     10/29/17 2200  amoxicillin-clavulanate (AUGMENTIN) 875-125 MG per tablet 1 tablet     1 tablet Oral Every 12 hours 10/29/17 1716     10/26/17 0600  vancomycin (VANCOCIN) IVPB 750 mg/150 ml premix  Status:  Discontinued     750 mg 150 mL/hr over 60 Minutes Intravenous Every 12 hours 10/25/17 1700 10/29/17 1716   10/25/17 1800  vancomycin (VANCOCIN) 2,000 mg in sodium chloride 0.9 % 500 mL IVPB     2,000 mg 250 mL/hr over 120 Minutes Intravenous  Once 10/25/17 1700 10/26/17 0101  10/25/17 1715  piperacillin-tazobactam (ZOSYN) IVPB 3.375 g  Status:  Discontinued     3.375 g 12.5 mL/hr over 240 Minutes Intravenous Every 8 hours 10/25/17 1700 10/29/17 1716   10/22/17 1400  ceFAZolin (ANCEF) IVPB 2g/100 mL premix  Status:  Discontinued     2 g 200 mL/hr over 30 Minutes Intravenous Every 8 hours 10/22/17  1123 10/25/17 1631   10/21/17 1800  cefTRIAXone (ROCEPHIN) 2 g in dextrose 5 % 50 mL IVPB  Status:  Discontinued     2 g 100 mL/hr over 30 Minutes Intravenous Every 24 hours 10/21/17 1737 10/22/17 1123   10/21/17 1630  vancomycin (VANCOCIN) IVPB 1000 mg/200 mL premix     1,000 mg 200 mL/hr over 60 Minutes Intravenous  Once 10/21/17 1629 10/21/17 1758       Subjective: Is pleasant today, still frustrated about the slow improvement of his cellulitis.  Objective: Vitals:   10/29/17 0200 10/29/17 0500 10/29/17 0952 10/29/17 1513  BP:  (!) 141/50 (!) 144/121 (!) 170/61  Pulse:  64    Resp:  20    Temp: 98.9 F (37.2 C) 98.3 F (36.8 C)    TempSrc: Oral Oral    SpO2:  95%    Weight:      Height:        Intake/Output Summary (Last 24 hours) at 10/29/2017 1716 Last data filed at 10/29/2017 1100 Gross per 24 hour  Intake 1130 ml  Output 2725 ml  Net -1595 ml   Filed Weights   10/21/17 1207 10/21/17 2056  Weight: 104.3 kg (230 lb) 107 kg (235 lb 14.3 oz)    Examination:  General exam: Alert, awake, oriented x 3 Respiratory system: Clear to auscultation. Respiratory effort normal. Cardiovascular system:RRR. No murmurs, rubs, gallops. Gastrointestinal system: Abdomen is nondistended, soft and nontender. No organomegaly or masses felt. Normal bowel sounds heard. Central nervous system: Alert and oriented. No focal neurological deficits. Extremities: Left: No clubbing, cyanosis or edema; right: See picture                              Psychiatry: Judgement and insight appear normal.  Mood is normal    Data Reviewed: I have personally reviewed following labs and imaging studies  CBC: Recent Labs  Lab 10/23/17 0538 10/24/17 0538 10/25/17 0538 10/26/17 0623 10/28/17 0453  WBC 12.9* 10.6* 11.9* 12.7* 11.4*  HGB 11.2* 11.6* 11.2* 11.2* 10.8*  HCT 33.7* 33.8* 33.0* 33.3* 32.5*  MCV 89.4 88.0 88.9 88.8 89.3  PLT 328 344 417* 500* 588*   Basic  Metabolic Panel: Recent Labs  Lab 10/24/17 0538 10/25/17 0538 10/26/17 0623 10/28/17 0453  NA 128* 128* 129* 129*  K 3.8 4.0 3.7 4.0  CL 97* 97* 96* 96*  CO2 22 22 23 25   GLUCOSE 97 95 103* 94  BUN 8 7 8 8   CREATININE 0.75 0.68 0.73 0.76  CALCIUM 8.2* 7.9* 8.2* 8.2*   GFR: Estimated Creatinine Clearance: 88.8 mL/min (by C-G formula based on SCr of 0.76 mg/dL). Liver Function Tests: No results for input(s): AST, ALT, ALKPHOS, BILITOT, PROT, ALBUMIN in the last 168 hours. No results for input(s): LIPASE, AMYLASE in the last 168 hours. No results for input(s): AMMONIA in the last 168 hours. Coagulation Profile: No results for input(s): INR, PROTIME in the last 168 hours. Cardiac Enzymes: No results for input(s): CKTOTAL, CKMB, CKMBINDEX, TROPONINI in the last 168 hours. BNP (last 3 results)  No results for input(s): PROBNP in the last 8760 hours. HbA1C: No results for input(s): HGBA1C in the last 72 hours. CBG: No results for input(s): GLUCAP in the last 168 hours. Lipid Profile: No results for input(s): CHOL, HDL, LDLCALC, TRIG, CHOLHDL, LDLDIRECT in the last 72 hours. Thyroid Function Tests: No results for input(s): TSH, T4TOTAL, FREET4, T3FREE, THYROIDAB in the last 72 hours. Anemia Panel: No results for input(s): VITAMINB12, FOLATE, FERRITIN, TIBC, IRON, RETICCTPCT in the last 72 hours. Urine analysis:    Component Value Date/Time   COLORURINE YELLOW 10/19/2017 [place] 10/19/2017 1510   LABSPEC 1.012 10/19/2017 1510   PHURINE 6.0 10/19/2017 1510   GLUCOSEU NEGATIVE 10/19/2017 1510   HGBUR NEGATIVE 10/19/2017 1510   HGBUR negative 10/27/2008 0838   BILIRUBINUR NEGATIVE 10/19/2017 1510   KETONESUR NEGATIVE 10/19/2017 1510   PROTEINUR NEGATIVE 10/19/2017 1510   UROBILINOGEN 0.2 01/11/2014 1405   NITRITE NEGATIVE 10/19/2017 1510   LEUKOCYTESUR TRACE (A) 10/19/2017 1510   Sepsis Labs: @LABRCNTIP (procalcitonin:4,lacticidven:4)  ) Recent Results  (from the past 240 hour(s))  Blood culture (routine x 2)     Status: None   Collection Time: 10/21/17  1:14 PM  Result Value Ref Range Status   Specimen Description BLOOD RIGHT ARM  Final   Special Requests   Final    BOTTLES DRAWN AEROBIC AND ANAEROBIC Blood Culture adequate volume   Culture NO GROWTH 5 DAYS  Final   Report Status 10/26/2017 FINAL  Final  Blood culture (routine x 2)     Status: None   Collection Time: 10/21/17  4:57 PM  Result Value Ref Range Status   Specimen Description BLOOD RIGHT HAND  Final   Special Requests   Final    BOTTLES DRAWN AEROBIC AND ANAEROBIC Blood Culture results may not be optimal due to an inadequate volume of blood received in culture bottles   Culture NO GROWTH 5 DAYS  Final   Report Status 10/26/2017 FINAL  Final         Radiology Studies: No results found.      Scheduled Meds: . amoxicillin-clavulanate  1 tablet Oral Q12H  . diltiazem  120 mg Oral Daily  . doxycycline  100 mg Oral Q12H  . heparin  5,000 Units Subcutaneous Q8H  . hydrALAZINE  25 mg Oral TID  . latanoprost  1 drop Both Eyes QHS  . LORazepam  1 mg Oral QHS  . losartan  100 mg Oral q morning - 10a  . pantoprazole  40 mg Oral Daily  . polyethylene glycol  17 g Oral Daily  . pravastatin  40 mg Oral QHS  . psyllium  1 packet Oral BID   Continuous Infusions:    LOS: 8 days    Time spent: 25 minutes. Greater than 50% of this time was spent in direct contact with the patient coordinating care.     Lelon Frohlich, MD [facility] Pager [phone]  If 7PM-7AM, please contact night-coverage [email or web address] Password [facility] 10/29/2017, 5:16 PM

## 2017-10-29 NOTE — Progress Notes (Signed)
Physical Therapy Treatment Patient Details Name: GUY SEESE MRN: [id number] DOB: [birth year] Today's Date: 10/29/2017    History of Present Illness Antwon Rochin is an 83yo white male who comes to [facility] on 10/21/17 after 2d swelling, redness, and pain in RLE, associated with fever: pt admitted for cellulitis. PMH: asthma, HTN, SVT, OSA, asbestosis, bilat TKA. 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     PT Comments    Patient demonstrates improvement for internally rotating right foot and attempting heel to toe stepping, limited secondary to increased pain with weightbearing, demonstrated increased endurance/distance and tolerated sitting up in chair with RLE elevated after therapy.  Patient will benefit from continued physical therapy in hospital and recommended venue below to increase strength, balance, endurance for safe ADLs and gait.    Follow Up Recommendations  Home health PT;Supervision for mobility/OOB     Equipment Recommendations  None recommended by PT    Recommendations for Other Services       Precautions / Restrictions Precautions Precautions: Fall Precaution Comments: elevate right LE when in bed.  Restrictions Weight Bearing Restrictions: No    Mobility  Bed Mobility Overal bed mobility: Needs Assistance Bed Mobility: Supine to Sit     Supine to sit: Supervision        Transfers Overall transfer level: Needs assistance Equipment used: Rolling walker (2 wheeled) Transfers: Sit to/from [employer] Transfers Sit to Stand: Supervision Stand pivot transfers: Min guard       General transfer comment: patient instructed to lean forward and push from bed/chair during sit to stands with good return demonstrated  Ambulation/Gait Ambulation/Gait assistance: Supervision Ambulation Distance (Feet): 75 Feet Assistive device: Rolling walker (2 wheeled) Gait Pattern/deviations: Step-through  pattern;Decreased step length - right;Decreased stance time - right;Decreased stride length   Gait velocity interpretation: Below normal speed for age/gender General Gait Details: demonstrates improvement for internally rotating RLE with limited heel to toe stepping due to increased pain with weightbearing, increased endurance/distance    Stairs            Wheelchair Mobility    Modified Rankin (Stroke Patients Only)       Balance Overall balance assessment: Needs assistance Sitting-balance support: No upper extremity supported;Feet supported Sitting balance-Leahy Scale: Good     Standing balance support: Bilateral upper extremity supported;During functional activity Standing balance-Leahy Scale: Fair                              Cognition Arousal/Alertness: Awake/alert Behavior During Therapy: WFL for tasks assessed/performed Overall Cognitive Status: Within Functional Limits for tasks assessed                                        Exercises General Exercises - Lower Extremity Ankle Circles/Pumps: Seated;AROM;Strengthening;Both;10 reps Long Arc Quad: Seated;AROM;Strengthening;Right;10 reps Hip Flexion/Marching: Seated;AROM;Strengthening;10 reps;Right    General Comments        Pertinent Vitals/Pain Pain Score: 5  Pain Location: right foot/leg Pain Descriptors / Indicators: Sore;Aching(touch sensitive) Pain Intervention(s): Limited activity within patient's tolerance;Monitored during session;Premedicated before session    Home Living                      Prior Function            PT Goals (current goals  can now be found in the care plan section) Acute Rehab PT Goals Patient Stated Goal: return home with spouse to assist PT Goal Formulation: With patient/family Time For Goal Achievement: 11/08/17 Potential to Achieve Goals: Good Progress towards PT goals: Progressing toward goals    Frequency    Min  3X/week      PT Plan Current plan remains appropriate    Co-evaluation              AM-PAC PT "6 Clicks" Daily Activity  Outcome Measure  Difficulty turning over in bed (including adjusting bedclothes, sheets and blankets)?: None Difficulty moving from lying on back to sitting on the side of the bed? : None Difficulty sitting down on and standing up from a chair with arms (e.g., wheelchair, bedside commode, etc,.)?: A Little Help needed moving to and from a bed to chair (including a wheelchair)?: A Little Help needed walking in hospital room?: A Little Help needed climbing 3-5 steps with a railing? : A Lot 6 Click Score: 19    End of Session   Activity Tolerance: Patient tolerated treatment well;Patient limited by fatigue;Patient limited by pain Patient left: in chair;with call bell/phone within reach;with family/visitor present Nurse Communication: Mobility status PT Visit Diagnosis: Unsteadiness on feet (R26.81);Other abnormalities of gait and mobility (R26.89);Muscle weakness (generalized) (M62.81)     Time: 0211-1552 PT Time Calculation (min) (ACUTE ONLY): 26 min  Charges:  $Therapeutic Activity: 23-37 mins                    G Codes:       11:52 AM, 11/18/2017 Lonell Grandchild, MPT Physical Therapist with [facility] [phone] office [phone] mobile phone

## 2017-10-29 NOTE — Care Management Note (Signed)
Case Management Note  Patient Details  Name: DELOS KLICH MRN: [id number] Date of Birth: 13-May-1935   If discussed at Long Length of Stay Meetings, dates discussed:  10/29/2017  Additional Comments:  Verenise Moulin, Chauncey Reading, RN 10/29/2017, 3:30 PM

## 2017-10-30 LAB — BASIC METABOLIC PANEL
ANION GAP: 8 (ref 5–15)
BUN: 12 mg/dL (ref 6–20)
CHLORIDE: 93 mmol/L — AB (ref 101–111)
CO2: 26 mmol/L (ref 22–32)
Calcium: 8.3 mg/dL — ABNORMAL LOW (ref 8.9–10.3)
Creatinine, Ser: 0.82 mg/dL (ref 0.61–1.24)
GFR calc Af Amer: >60 mL/min (ref >60)
Glucose, Bld: 90 mg/dL (ref 65–99)
POTASSIUM: 4.2 mmol/L (ref 3.5–5.1)
SODIUM: 127 mmol/L — AB (ref 135–145)

## 2017-10-30 LAB — CBC
HEMATOCRIT: 32 % — AB (ref 39.0–52.0)
HEMOGLOBIN: 10.8 g/dL — AB (ref 13.0–17.0)
MCH: 30.3 pg (ref 26.0–34.0)
MCHC: 33.8 g/dL (ref 30.0–36.0)
MCV: 89.9 fL (ref 78.0–100.0)
Platelets: 638 10*3/uL — ABNORMAL HIGH (ref 150–400)
RBC: 3.56 MIL/uL — AB (ref 4.22–5.81)
RDW: 13.4 % (ref 11.5–15.5)
WBC: 12.8 10*3/uL — AB (ref 4.0–10.5)

## 2017-10-30 MED ORDER — DOXYCYCLINE HYCLATE 100 MG PO TABS: 100.0000 mg | ORAL_TABLET | Freq: Two times a day (BID) | ORAL | 0 refills | Status: DC

## 2017-10-30 MED ORDER — AMOXICILLIN-POT CLAVULANATE 875-125 MG PO TABS: 1.0000 | ORAL_TABLET | Freq: Two times a day (BID) | ORAL | 0 refills | Status: DC

## 2017-10-30 MED ORDER — IBUPROFEN 400 MG PO TABS: 400.0000 mg | ORAL_TABLET | Freq: Three times a day (TID) | ORAL | 0 refills | Status: DC | PRN

## 2017-10-30 NOTE — Progress Notes (Signed)
Discharge Summary faxed to Dr. Lizbeth Bark office per patient request. Spoke with receptionist at office to confirm fax number.

## 2017-10-30 NOTE — Progress Notes (Signed)
Pt given cough medication at 11:45pm, pt resting comfortably without further c/o cough.

## 2017-10-30 NOTE — Care Management Important Message (Signed)
Important Message  Patient Details  Name: Julian Fowler MRN: [id number] Date of Birth: [birth year]   Medicare Important Message Given:  Yes    Timouthy Gilardi, Chauncey Reading, RN 10/30/2017, 7:54 AM

## 2017-10-30 NOTE — Progress Notes (Signed)
Pt and wife c/o patient having increased coughing.  Pt noted to have dry non productive cough.  Lung sound clear bilaterally.  MD notified to request cough medication.

## 2017-10-30 NOTE — Care Management Note (Signed)
Case Management Note  Patient Details  Name: VIC ESCO MRN: [id number] Date of Birth: April 29, 1935   Expected Discharge Date:  10/30/17               Expected Discharge Plan:  [place]  In-House Referral:     Discharge planning Services  CM Consult  Post Acute Care Choice:  Home Health Choice offered to:  Patient  DME Arranged:    DME Agency:     HH Arranged:  PT [place]:  [place]  Status of Service:  Completed, signed off  If discussed at [place] of Stay Meetings, dates discussed:    Additional Comments: Discharging home today with Home health. Juliann Pulse of [facility] notified.  Shayne Diguglielmo, Chauncey Reading, RN 10/30/2017, 12:24 PM

## 2017-10-30 NOTE — Discharge Summary (Signed)
Physician Discharge Summary  Julian Fowler JXB:[id number] DOB: [birth year] DOA: 10/21/2017  PCP: [name], MD  Admit date: 10/21/2017 Discharge date: 10/30/2017  Time spent: 45 minutes  Recommendations for Outpatient Follow-up:  -To be discharged home today. -To complete a 10-day course of oral antibiotics consisting of Augmentin and doxycycline for his severe right lower extremity cellulitis. -Advise follow-up with PCP in 2 weeks.  Discharge Diagnoses:  Principal Problem:   Cellulitis Active Problems:   Asthma, mild intermittent, well-controlled   Essential hypertension   OSA (obstructive sleep apnea)   Cardiac murmur   Discharge Condition: Stable and improved  Filed Weights   10/21/17 1207 10/21/17 2056  Weight: 104.3 kg (230 lb) 107 kg (235 lb 14.3 oz)    History of present illness:  As per Dr. Denton Brick on 1/7:  Julian Fowler is a 83 y.o. male with medical history significant for asthma, hypertension, abdominal aortic injury, SVT, OSA, asbestosis.  Patient presented to the ED today with complaints of right leg swelling and redness that started on Saturday evening -2 days ago, after he to the ED that day.  Swelling and redness started in his toes then, extended up towards his knees and then groin patient was in the ED 10/19/16, for fever, body aches, leukocytosis , flu check was negative, thought to be viral etiology, was discharged home on Tamiflu.  Since Saturday patient's wife has recorded fevers of 100F, highest recorded was prior to ED visit 2 days ago, 102.  Patient denies pain in his right leg, no purulence or drainage, endorses mild intermittent extremity swelling involving only his left leg.  Patient denies trauma to his leg, no ulcers or wounds, but thinks he might have bumped his right ultrasound negative for DVT 2-3 days ago.   ED Course: BP mildly elevated 158/85 otherwise unremarkable vitals, WBC elevated at 18, sodium low 125,.  Right lower extremity  ultrasound negative for DVT.  Lactic acid 1.  Patient was started on IV vancomycin hospitalist was called to admit for cellulitis    Hospital Course:   Right lower extremity cellulitis -With continued improvement overnight.  Will insert picture of cellulitis upon presentation to the hospital and on day of discharge:  10/21/17    10/30/17      -Was transtioned over to broad-spectrum abx (vanc and zosyn) on 1/11 as his cellulitis was showing slow improvement while on just Ancef.  -On day prior to discharge he was transitioned over to oral antibiotics.  Given the fact that he truly had no significant improvement until broad-spectrum antibiotics were started, he was placed on doxycycline for MRSA coverage and Augmentin as a Zosyn equivalent.  I plan to continue these antibiotics for 10 days following discharge. -Right lower extremity US negative for DVT.   Hyponatremia -Hydrochlorothiazide remains on hold. -Per chart review, baseline Na levels appear to be around 129-132. -Na has remained in the upper 120 range this hospitalization: between 127-129. -?SIADH. This can be further investigated in the OP setting.  Essential hypertension -Fair control, continue current regimen.  History of SVT -Currently in sinus rhythm, continue Cardizem CD.  Hyperlipidemia -Continue statin.  Constipation -Was able to have a BM with fiber/miralax ordered.  Glaucoma -Continue eyedrops.  Bereavement -Patient's brother, to whom he was very close with, recently passed away and his funeral was this week and he missed it because he was hospitalized. -He seems appropriately angry and frustrated about this. -Do not believe he needs antidepressants at this  time.     Procedures:  None   Consultations:  None  Discharge Instructions  Discharge Instructions    Diet - low sodium heart healthy   Complete by:  As directed    Increase activity slowly   Complete by:  As directed       Allergies as of 10/30/2017      Reactions   Codeine Other (See Comments)   REACTION: groggy   Tape Other (See Comments)   Skin tears, use paper tape      Medication List    STOP taking these medications   oseltamivir 75 MG capsule Commonly known as:  TAMIFLU     TAKE these medications   acetaminophen 325 MG tablet Commonly known as:  TYLENOL Take 325 mg by mouth every 6 (six) hours as needed for pain.   amoxicillin-clavulanate 875-125 MG tablet Commonly known as:  AUGMENTIN Take 1 tablet by mouth every 12 (twelve) hours.   BENEFIBER Powd Take by mouth 2 (two) times daily. Takes 1 Tbsp twice daily   CARTIA XT 120 MG 24 hr capsule Generic drug:  diltiazem TAKE ONE CAPSULE BY MOUTH TWICE DAILY   CENTRUM SILVER PO Take 1 tablet by mouth daily.   doxycycline 100 MG tablet Commonly known as:  VIBRA-TABS Take 1 tablet (100 mg total) by mouth every 12 (twelve) hours.   esomeprazole 40 MG capsule Commonly known as:  NEXIUM Take 40 mg by mouth every morning.   fluticasone 50 MCG/ACT nasal spray Commonly known as:  FLONASE Place 2 sprays into both nostrils daily. What changed:    when to take this  reasons to take this   hydrALAZINE 25 MG tablet Commonly known as:  APRESOLINE Take 1 tablet (25 mg total) by mouth 3 (three) times daily.   hydrochlorothiazide 25 MG tablet Commonly known as:  HYDRODIURIL Take 25 mg by mouth daily.   ibuprofen 400 MG tablet Commonly known as:  ADVIL,MOTRIN Take 1 tablet (400 mg total) by mouth every 8 (eight) hours as needed for fever or moderate pain.   latanoprost 0.005 % ophthalmic solution Commonly known as:  XALATAN Place 1 drop into both eyes at bedtime.   LORazepam 1 MG tablet Commonly known as:  ATIVAN TAKE ONE TO TWO TABLETS BY MOUTH AT BEDTIME AS NEEDED   losartan 100 MG tablet Commonly known as:  COZAAR Take 100 mg by mouth every morning.   OS-CAL ULTRA 600 MG Tabs Take 1 tablet by mouth daily.    polyethylene glycol packet Commonly known as:  MIRALAX / GLYCOLAX Take 17 g by mouth daily.   potassium chloride SA 20 MEQ tablet Commonly known as:  K-DUR,KLOR-CON Take 1 tablet (20 mEq total) by mouth 2 (two) times daily.   pravastatin 40 MG tablet Commonly known as:  PRAVACHOL Take 1 tablet by mouth at bedtime.   zolpidem 10 MG tablet Commonly known as:  AMBIEN Take 1 tablet by mouth at bedtime.      Allergies  Allergen Reactions  . Codeine Other (See Comments)    REACTION: groggy  . Tape Other (See Comments)    Skin tears, use paper tape   Follow-up Information    [place] Follow up.   Why:  HH PT Contact information: [address] [place] [phone]       [name], MD. Schedule an appointment as soon as possible for a visit in 2 week(s).   Specialty:  Family Medicine  Contact information: [place] [address] [phone]            The results of significant diagnostics from this hospitalization (including imaging, microbiology, ancillary and laboratory) are listed below for reference.    Significant Diagnostic Studies: Dg Chest 2 View  Result Date: 10/21/2017 CLINICAL DATA:  Infection, right leg swelling and redness. EXAM: CHEST  2 VIEW COMPARISON:  10/19/2017. FINDINGS: Trachea is midline. Heart size normal. Thoracic aorta is calcified. There are calcified pleural plaques bilaterally. No superimposed airspace consolidation or pleural fluid. IMPRESSION: 1. No acute findings. 2. Asbestos related pleural disease. 3.  Aortic atherosclerosis (ICD10-170.0). Electronically Signed   By: Lorin Picket M.D.   On: 10/21/2017 12:41   Dg Chest 2 View  Result Date: 10/19/2017 CLINICAL DATA:  Fever. EXAM: CHEST  2 VIEW COMPARISON:  06/29/2017 and prior radiograph FINDINGS: Upper limits normal heart size again noted. Pleural calcifications are again noted. There is no evidence of focal airspace disease,  pulmonary edema, suspicious pulmonary nodule/mass, pleural effusion, or pneumothorax. No acute bony abnormalities are identified. IMPRESSION: No active cardiopulmonary disease. Electronically Signed   By: Margarette Canada M.D.   On: 10/19/2017 15:34   US Venous Img Lower Unilateral Right  Result Date: 10/21/2017 CLINICAL DATA:  Right lower extremity pain and redness and edema. EXAM: RIGHT LOWER EXTREMITY VENOUS DOPPLER ULTRASOUND TECHNIQUE: Gray-scale sonography with graded compression, as well as color Doppler and duplex ultrasound were performed to evaluate the lower extremity deep venous systems from the level of the common femoral vein and including the common femoral, femoral, profunda femoral, popliteal and calf veins including the posterior tibial, peroneal and gastrocnemius veins when visible. The superficial great saphenous vein was also interrogated. Spectral Doppler was utilized to evaluate flow at rest and with distal augmentation maneuvers in the common femoral, femoral and popliteal veins. COMPARISON:  None. FINDINGS: Contralateral Common Femoral Vein: Respiratory phasicity is normal and symmetric with the symptomatic side. No evidence of thrombus. Normal compressibility. Common Femoral Vein: No evidence of thrombus. Normal compressibility, respiratory phasicity and response to augmentation. Saphenofemoral Junction: No evidence of thrombus. Normal compressibility and flow on color Doppler imaging. Profunda Femoral Vein: No evidence of thrombus. Normal compressibility and flow on color Doppler imaging. Femoral Vein: No evidence of thrombus. Normal compressibility, respiratory phasicity and response to augmentation. Popliteal Vein: No evidence of thrombus. Normal compressibility, respiratory phasicity and response to augmentation. Calf Veins: Visualized right deep calf veins are patent without thrombus. Other Findings:  Subcutaneous edema in the right calf. IMPRESSION: Negative for deep venous thrombosis  in right lower extremity. Electronically Signed   By: Markus Daft M.D.   On: 10/21/2017 14:37    Microbiology: Recent Results (from the past 240 hour(s))  Blood culture (routine x 2)     Status: None   Collection Time: 10/21/17  1:14 PM  Result Value Ref Range Status   Specimen Description BLOOD RIGHT ARM  Final   Special Requests   Final    BOTTLES DRAWN AEROBIC AND ANAEROBIC Blood Culture adequate volume   Culture NO GROWTH 5 DAYS  Final   Report Status 10/26/2017 FINAL  Final  Blood culture (routine x 2)     Status: None   Collection Time: 10/21/17  4:57 PM  Result Value Ref Range Status   Specimen Description BLOOD RIGHT HAND  Final   Special Requests   Final    BOTTLES DRAWN AEROBIC AND ANAEROBIC Blood Culture results may not be optimal due  to an inadequate volume of blood received in culture bottles   Culture NO GROWTH 5 DAYS  Final   Report Status 10/26/2017 FINAL  Final     Labs: Basic Metabolic Panel: Recent Labs  Lab 10/24/17 0538 10/25/17 0538 10/26/17 0623 10/28/17 0453 10/30/17 0525  NA 128* 128* 129* 129* 127*  K 3.8 4.0 3.7 4.0 4.2  CL 97* 97* 96* 96* 93*  CO2 22 22 23 25 26   GLUCOSE 97 95 103* 94 90  BUN 8 7 8 8 12   CREATININE 0.75 0.68 0.73 0.76 0.82  CALCIUM 8.2* 7.9* 8.2* 8.2* 8.3*   Liver Function Tests: No results for input(s): AST, ALT, ALKPHOS, BILITOT, PROT, ALBUMIN in the last 168 hours. No results for input(s): LIPASE, AMYLASE in the last 168 hours. No results for input(s): AMMONIA in the last 168 hours. CBC: Recent Labs  Lab 10/24/17 0538 10/25/17 0538 10/26/17 0623 10/28/17 0453 10/30/17 0525  WBC 10.6* 11.9* 12.7* 11.4* 12.8*  HGB 11.6* 11.2* 11.2* 10.8* 10.8*  HCT 33.8* 33.0* 33.3* 32.5* 32.0*  MCV 88.0 88.9 88.8 89.3 89.9  PLT 344 417* 500* 588* 638*   Cardiac Enzymes: No results for input(s): CKTOTAL, CKMB, CKMBINDEX, TROPONINI in the last 168 hours. BNP: BNP (last 3 results) No results for input(s): BNP in the last 8760  hours.  ProBNP (last 3 results) No results for input(s): PROBNP in the last 8760 hours.  CBG: No results for input(s): GLUCAP in the last 168 hours.     Signed:  Lelon Frohlich  [facility] Pager: [phone] 10/30/2017, 12:41 PM

## 2017-10-30 NOTE — Progress Notes (Signed)
Pt discharged home today per Dr. Hernandez. Pt's IV site D/C'd and WDL. Pt's VSS. Pt provided with home medication list, discharge instructions and prescriptions. Verbalized understanding. Pt left floor via WC in stable condition accompanied by NT. 

## 2017-11-04 DIAGNOSIS — I10 Essential (primary) hypertension: Secondary | ICD-10-CM | POA: Diagnosis not present

## 2017-11-04 DIAGNOSIS — Z87891 Personal history of nicotine dependence: Secondary | ICD-10-CM | POA: Diagnosis not present

## 2017-11-04 DIAGNOSIS — L03115 Cellulitis of right lower limb: Secondary | ICD-10-CM | POA: Diagnosis not present

## 2017-11-04 DIAGNOSIS — Z86018 Personal history of other benign neoplasm: Secondary | ICD-10-CM | POA: Diagnosis not present

## 2017-11-04 DIAGNOSIS — Z77090 Contact with and (suspected) exposure to asbestos: Secondary | ICD-10-CM | POA: Diagnosis not present

## 2017-11-04 DIAGNOSIS — J4520 Mild intermittent asthma, uncomplicated: Secondary | ICD-10-CM | POA: Diagnosis not present

## 2017-11-04 DIAGNOSIS — G4733 Obstructive sleep apnea (adult) (pediatric): Secondary | ICD-10-CM | POA: Diagnosis not present

## 2017-11-04 DIAGNOSIS — H409 Unspecified glaucoma: Secondary | ICD-10-CM | POA: Diagnosis not present

## 2017-11-06 DIAGNOSIS — R600 Localized edema: Secondary | ICD-10-CM | POA: Diagnosis not present

## 2017-11-06 DIAGNOSIS — M7989 Other specified soft tissue disorders: Secondary | ICD-10-CM | POA: Diagnosis not present

## 2017-11-07 ENCOUNTER — Encounter: Payer: Self-pay | Admitting: Infectious Diseases

## 2017-11-07 DIAGNOSIS — G2581 Restless legs syndrome: Secondary | ICD-10-CM | POA: Diagnosis not present

## 2017-11-07 DIAGNOSIS — R600 Localized edema: Secondary | ICD-10-CM | POA: Diagnosis not present

## 2017-11-20 DIAGNOSIS — I10 Essential (primary) hypertension: Secondary | ICD-10-CM | POA: Diagnosis not present

## 2017-11-20 DIAGNOSIS — F5221 Male erectile disorder: Secondary | ICD-10-CM | POA: Diagnosis not present

## 2017-11-20 DIAGNOSIS — Z6834 Body mass index (BMI) 34.0-34.9, adult: Secondary | ICD-10-CM | POA: Diagnosis not present

## 2017-11-20 DIAGNOSIS — R600 Localized edema: Secondary | ICD-10-CM | POA: Diagnosis not present

## 2017-11-20 DIAGNOSIS — L959 Vasculitis limited to the skin, unspecified: Secondary | ICD-10-CM | POA: Diagnosis not present

## 2017-11-29 DIAGNOSIS — E782 Mixed hyperlipidemia: Secondary | ICD-10-CM | POA: Diagnosis not present

## 2017-11-29 DIAGNOSIS — R600 Localized edema: Secondary | ICD-10-CM | POA: Diagnosis not present

## 2017-11-29 DIAGNOSIS — L03115 Cellulitis of right lower limb: Secondary | ICD-10-CM | POA: Diagnosis not present

## 2017-11-29 DIAGNOSIS — Z96651 Presence of right artificial knee joint: Secondary | ICD-10-CM | POA: Diagnosis not present

## 2017-11-29 DIAGNOSIS — E876 Hypokalemia: Secondary | ICD-10-CM | POA: Diagnosis not present

## 2017-11-29 DIAGNOSIS — I10 Essential (primary) hypertension: Secondary | ICD-10-CM | POA: Diagnosis not present

## 2017-11-29 DIAGNOSIS — E871 Hypo-osmolality and hyponatremia: Secondary | ICD-10-CM | POA: Diagnosis not present

## 2017-12-03 ENCOUNTER — Other Ambulatory Visit: Payer: Self-pay | Admitting: Internal Medicine

## 2017-12-03 NOTE — Telephone Encounter (Signed)
CY Please advise on refill. Thanks.  

## 2017-12-04 ENCOUNTER — Telehealth: Payer: Self-pay | Admitting: Internal Medicine

## 2017-12-04 DIAGNOSIS — R600 Localized edema: Secondary | ICD-10-CM | POA: Diagnosis not present

## 2017-12-04 DIAGNOSIS — L959 Vasculitis limited to the skin, unspecified: Secondary | ICD-10-CM | POA: Diagnosis not present

## 2017-12-04 DIAGNOSIS — I10 Essential (primary) hypertension: Secondary | ICD-10-CM | POA: Diagnosis not present

## 2017-12-04 DIAGNOSIS — Z6834 Body mass index (BMI) 34.0-34.9, adult: Secondary | ICD-10-CM | POA: Diagnosis not present

## 2017-12-04 DIAGNOSIS — F5221 Male erectile disorder: Secondary | ICD-10-CM | POA: Diagnosis not present

## 2017-12-04 NOTE — Telephone Encounter (Signed)
Ok to refill 

## 2017-12-04 NOTE — Telephone Encounter (Signed)
Called and spoke with Junie Panning at pt's pharmacy verifying the quantity of medication for pt.  Erin expressed understanding. Nothing further needed at this current time.

## 2017-12-04 NOTE — Telephone Encounter (Signed)
Rx sent in to the pharmacy 

## 2017-12-09 DIAGNOSIS — J302 Other seasonal allergic rhinitis: Secondary | ICD-10-CM | POA: Diagnosis not present

## 2017-12-09 DIAGNOSIS — I10 Essential (primary) hypertension: Secondary | ICD-10-CM | POA: Diagnosis not present

## 2017-12-09 DIAGNOSIS — L959 Vasculitis limited to the skin, unspecified: Secondary | ICD-10-CM | POA: Diagnosis not present

## 2017-12-09 DIAGNOSIS — E782 Mixed hyperlipidemia: Secondary | ICD-10-CM | POA: Diagnosis not present

## 2017-12-09 DIAGNOSIS — E871 Hypo-osmolality and hyponatremia: Secondary | ICD-10-CM | POA: Diagnosis not present

## 2017-12-09 DIAGNOSIS — H11823 Conjunctivochalasis, bilateral: Secondary | ICD-10-CM | POA: Diagnosis not present

## 2017-12-09 DIAGNOSIS — H16421 Pannus (corneal), right eye: Secondary | ICD-10-CM | POA: Diagnosis not present

## 2017-12-09 DIAGNOSIS — R600 Localized edema: Secondary | ICD-10-CM | POA: Diagnosis not present

## 2017-12-09 DIAGNOSIS — R05 Cough: Secondary | ICD-10-CM | POA: Diagnosis not present

## 2017-12-09 DIAGNOSIS — H401131 Primary open-angle glaucoma, bilateral, mild stage: Secondary | ICD-10-CM | POA: Diagnosis not present

## 2017-12-09 DIAGNOSIS — Z6834 Body mass index (BMI) 34.0-34.9, adult: Secondary | ICD-10-CM | POA: Diagnosis not present

## 2017-12-09 DIAGNOSIS — H02054 Trichiasis without entropian left upper eyelid: Secondary | ICD-10-CM | POA: Diagnosis not present

## 2017-12-09 DIAGNOSIS — H11153 Pinguecula, bilateral: Secondary | ICD-10-CM | POA: Diagnosis not present

## 2017-12-11 ENCOUNTER — Other Ambulatory Visit: Payer: Self-pay

## 2017-12-11 ENCOUNTER — Encounter: Payer: Self-pay | Admitting: Physician Assistant

## 2017-12-11 ENCOUNTER — Ambulatory Visit (INDEPENDENT_AMBULATORY_CARE_PROVIDER_SITE_OTHER): Payer: Medicare Other | Admitting: Physician Assistant

## 2017-12-11 ENCOUNTER — Encounter: Payer: Self-pay | Admitting: *Deleted

## 2017-12-11 VITALS — BP 130/60 | HR 71 | Ht 69.0 in | Wt 225.2 lb

## 2017-12-11 DIAGNOSIS — Z79899 Other long term (current) drug therapy: Secondary | ICD-10-CM

## 2017-12-11 DIAGNOSIS — E871 Hypo-osmolality and hyponatremia: Secondary | ICD-10-CM | POA: Diagnosis not present

## 2017-12-11 DIAGNOSIS — R609 Edema, unspecified: Secondary | ICD-10-CM | POA: Diagnosis not present

## 2017-12-11 DIAGNOSIS — L03115 Cellulitis of right lower limb: Secondary | ICD-10-CM

## 2017-12-11 DIAGNOSIS — I10 Essential (primary) hypertension: Secondary | ICD-10-CM

## 2017-12-11 DIAGNOSIS — I471 Supraventricular tachycardia: Secondary | ICD-10-CM | POA: Diagnosis not present

## 2017-12-11 MED ORDER — SPIRONOLACTONE 25 MG PO TABS: 25.0000 mg | ORAL_TABLET | Freq: Every day | ORAL | 3 refills | Status: DC

## 2017-12-11 MED ORDER — FUROSEMIDE 40 MG PO TABS: 40.0000 mg | ORAL_TABLET | Freq: Every day | ORAL | 3 refills | Status: DC

## 2017-12-11 NOTE — Patient Instructions (Signed)
Medication Instructions:  Your physician has recommended you make the following change in your medication:  Stop Taking HCTZ  Start Taking Spironolactone 25 mg Daily  Increase Lasix to 40 mg Daily    Labwork: Your physician recommends that you return for lab work in: 1 Week ( March 6)    Testing/Procedures: Your physician has requested that you have an echocardiogram. Echocardiography is a painless test that uses sound waves to create images of your heart. It provides your doctor with information about the size and shape of your heart and how well your heart's chambers and valves are working. This procedure takes approximately one hour. There are no restrictions for this procedure.    Follow-Up: Your physician recommends that you schedule a follow-up appointment in: [facility] office with Dr. Bronson Ing   Any Other Special Instructions Will Be Listed Below (If Applicable). Your provider would like to get compression hose.      If you need a refill on your cardiac medications before your next appointment, please call your pharmacy.   Low-Sodium Eating Plan Sodium, which is an element that makes up salt, helps you maintain a healthy balance of fluids in your body. Too much sodium can increase your blood pressure and cause fluid and waste to be held in your body. Your health care provider or dietitian may recommend following this plan if you have high blood pressure (hypertension), kidney disease, liver disease, or heart failure. Eating less sodium can help lower your blood pressure, reduce swelling, and protect your heart, liver, and kidneys. What are tips for following this plan? General guidelines  Most people on this plan should limit their sodium intake to 1,500-2,000 mg (milligrams) of sodium each day. Reading food labels  The Nutrition Facts label lists the amount of sodium in one serving of the food. If you eat more than one serving, you must multiply the listed amount of sodium  by the number of servings.  Choose foods with less than 140 mg of sodium per serving.  Avoid foods with 300 mg of sodium or more per serving. Shopping  Look for lower-sodium products, often labeled as "low-sodium" or "no salt added."  Always check the sodium content even if foods are labeled as "unsalted" or "no salt added".  Buy fresh foods. ? Avoid canned foods and premade or frozen meals. ? Avoid canned, cured, or processed meats  Buy breads that have less than 80 mg of sodium per slice. Cooking  Eat more home-cooked food and less restaurant, buffet, and fast food.  Avoid adding salt when cooking. Use salt-free seasonings or herbs instead of table salt or sea salt. Check with your health care provider or pharmacist before using salt substitutes.  Cook with plant-based oils, such as canola, sunflower, or olive oil. Meal planning  When eating at a restaurant, ask that your food be prepared with less salt or no salt, if possible.  Avoid foods that contain MSG (monosodium glutamate). MSG is sometimes added to [place] food, bouillon, and some canned foods. What foods are recommended? The items listed may not be a complete list. Talk with your dietitian about what dietary choices are best for you. Grains Low-sodium cereals, including oats, puffed wheat and rice, and shredded wheat. Low-sodium crackers. Unsalted rice. Unsalted pasta. Low-sodium bread. Whole-grain breads and whole-grain pasta. Vegetables Fresh or frozen vegetables. "No salt added" canned vegetables. "No salt added" tomato sauce and paste. Low-sodium or reduced-sodium tomato and vegetable juice. Fruits Fresh, frozen, or canned fruit. Fruit juice.  Meats and other protein foods Fresh or frozen (no salt added) meat, poultry, seafood, and fish. Low-sodium canned tuna and salmon. Unsalted nuts. Dried peas, beans, and lentils without added salt. Unsalted canned beans. Eggs. Unsalted nut butters. Dairy Milk. Soy milk.  Cheese that is naturally low in sodium, such as ricotta cheese, fresh mozzarella, or Swiss cheese Low-sodium or reduced-sodium cheese. Cream cheese. Yogurt. Fats and oils Unsalted butter. Unsalted margarine with no trans fat. Vegetable oils such as canola or olive oils. Seasonings and other foods Fresh and dried herbs and spices. Salt-free seasonings. Low-sodium mustard and ketchup. Sodium-free salad dressing. Sodium-free light mayonnaise. Fresh or refrigerated horseradish. Lemon juice. Vinegar. Homemade, reduced-sodium, or low-sodium soups. Unsalted popcorn and pretzels. Low-salt or salt-free chips. What foods are not recommended? The items listed may not be a complete list. Talk with your dietitian about what dietary choices are best for you. Grains Instant hot cereals. Bread stuffing, pancake, and biscuit mixes. Croutons. Seasoned rice or pasta mixes. Noodle soup cups. Boxed or frozen macaroni and cheese. Regular salted crackers. Self-rising flour. Vegetables Sauerkraut, pickled vegetables, and relishes. Olives. [place] fries. Onion rings. Regular canned vegetables (not low-sodium or reduced-sodium). Regular canned tomato sauce and paste (not low-sodium or reduced-sodium). Regular tomato and vegetable juice (not low-sodium or reduced-sodium). Frozen vegetables in sauces. Meats and other protein foods Meat or fish that is salted, canned, smoked, spiced, or pickled. Bacon, ham, sausage, hotdogs, corned beef, chipped beef, packaged lunch meats, salt pork, jerky, pickled herring, anchovies, regular canned tuna, sardines, salted nuts. Dairy Processed cheese and cheese spreads. Cheese curds. Blue cheese. Feta cheese. String cheese. Regular cottage cheese. Buttermilk. Canned milk. Fats and oils Salted butter. Regular margarine. Ghee. Bacon fat. Seasonings and other foods Onion salt, garlic salt, seasoned salt, table salt, and sea salt. Canned and packaged gravies. Worcestershire sauce. Tartar sauce.  Barbecue sauce. Teriyaki sauce. Soy sauce, including reduced-sodium. Steak sauce. Fish sauce. Oyster sauce. Cocktail sauce. Horseradish that you find on the shelf. Regular ketchup and mustard. Meat flavorings and tenderizers. Bouillon cubes. Hot sauce and Tabasco sauce. Premade or packaged marinades. Premade or packaged taco seasonings. Relishes. Regular salad dressings. Salsa. Potato and tortilla chips. Corn chips and puffs. Salted popcorn and pretzels. Canned or dried soups. Pizza. Frozen entrees and pot pies. Summary  Eating less sodium can help lower your blood pressure, reduce swelling, and protect your heart, liver, and kidneys.  Most people on this plan should limit their sodium intake to 1,500-2,000 mg (milligrams) of sodium each day.  Canned, boxed, and frozen foods are high in sodium. Restaurant foods, fast foods, and pizza are also very high in sodium. You also get sodium by adding salt to food.  Try to cook at home, eat more fresh fruits and vegetables, and eat less fast food, canned, processed, or prepared foods. This information is not intended to replace advice given to you by your health care provider. Make sure you discuss any questions you have with your health care provider. Document Released: 03/23/2002 Document Revised: 09/24/2016 Document Reviewed: 09/24/2016 [employer] Interactive Patient Education  [employer].

## 2017-12-11 NOTE — Progress Notes (Signed)
Cardiology Office Note    Date:  12/11/2017   ID:  Julian Fowler, DOB 01-Sep-1935, MRN [id number]  PCP:  [name], MD  Cardiologist: Kate Sable, MD  No chief complaint on file.   History of Present Illness:  Julian Fowler is a 83 y.o. male with history of PSVT on diltiazem and hypertension as well as noncardiac chest pain and HLD.  Last saw Dr. Bronson Ing 01/2017 which time his blood pressures were consistently elevated.  They were eating out twice a day and a lot of salt.  He started hydralazine 25 mg 3 times daily.  Patient had a recent hospitalization 10/30/17 with cellulitis and was also hyponatremic so HCTZ was on hold.  Most recent sodium was 127 on 10/30/17 white count 12.8 hemoglobin 10.8.  Cellulitis was slow to improve so he was treated with Vanc and Zosyn and discharged on doxycycline for MRSA coverage and Augmentin for another 10 days.  He can use to have swelling and erythema and followed by his primary care.  He was placed on Lasix 40 mg daily but then decreased to 20 mg daily because of his sodium.  He had a sodium of 126 last week but 2/26 sodium was 129 which seems to be his baseline similar to 06/2017.  Dopplers were negative for DVT and arterial Dopplers were normal.  Patient is wife eat out twice a day and have been eating extra salt because his sodium was low.  He denies chest pain, dyspnea, dyspnea on exertion, dizziness or presyncope.    Past Medical History:  Diagnosis Date  . Abdominal aorta injury    Normal size, ultrasound, March 17,2011  . Allergic rhinitis   . Arthritis   . Asbestos exposure Jan 2001   Hx of asbestos related pleural plaques  . Balance problem   . BPH (benign prostatic hyperplasia)   . Complication of anesthesia    "trouble waking up"  . Easy bruisability   . ED (erectile dysfunction)   . Ejection fraction    60%, echo, 2008, mild RV dysfunction  . GERD (gastroesophageal reflux disease)   . H/O hiatal hernia   .  Hearing loss in left ear   . History of shingles    "mild"  . Hyperlipidemia    under control  . Hypertension December 29, 2009   renal artery Doppler , normal  12/2009  . Overactive bladder   . Sleep apnea    was dx 10 years ago reports lost a lot of weight and no longer machine  . Tinnitus     Past Surgical History:  Procedure Laterality Date  . BACK SURGERY  2014   herniated L1, L2   Dr Carloyn Manner  . BRAIN SURGERY    . CEREBRAL EMBOLIZATION  12/2011   "radiation therapy-did not work"  . CHOLECYSTECTOMY  6/98  . COLONOSCOPY  09/2009   Dr. Lindalou Hose: normal, internal hemorrhoids   . COLONOSCOPY N/A 02/28/2017   Procedure: COLONOSCOPY;  Surgeon: [name], MD;  Location: [facility];  Service: Endoscopy;  Laterality: N/A;  730   . CRANIECTOMY FOR EXCISION OF ACOUSTIC NEUROMA  3/95  . RADIOLOGY WITH ANESTHESIA N/A 07/07/2014   Procedure: EMBOLIZATION;  Surgeon: Rob Hickman, MD;  Location: [facility];  Service: Radiology;  Laterality: N/A;  . TOTAL KNEE ARTHROPLASTY Left 07/06/2013   Procedure: LEFT TOTAL KNEE ARTHROPLASTY;  Surgeon: Gearlean Alf, MD;  Location: [facility];  Service: Orthopedics;  Laterality: Left;  .  TOTAL KNEE ARTHROPLASTY Right 01/18/2014   Procedure: RIGHT TOTAL KNEE ARTHROPLASTY;  Surgeon: Gearlean Alf, MD;  Location: [facility];  Service: Orthopedics;  Laterality: Right;  . TRIGGER FINGER RELEASE  2003   (thumb) middle finger (2006)    Current Medications: Current Meds  Medication Sig  . acetaminophen (TYLENOL) 325 MG tablet Take 325 mg by mouth every 6 (six) hours as needed for pain.   Marland Kitchen aspirin EC 81 MG tablet Take 81 mg by mouth daily.  . Calcium Carb-Vit D-C-E-Mineral (OS-CAL ULTRA) 600 MG TABS Take 1 tablet by mouth daily.   Marland Kitchen CARTIA XT 120 MG 24 hr capsule TAKE ONE CAPSULE BY MOUTH TWICE DAILY  . esomeprazole (NEXIUM) 40 MG capsule Take 40 mg by mouth every morning.   . fexofenadine (ALLEGRA) 180 MG tablet Take 180 mg by mouth daily.  . fluticasone  (FLONASE) 50 MCG/ACT nasal spray Place 2 sprays into both nostrils daily. (Patient taking differently: Place 2 sprays into both nostrils daily as needed for allergies. )  . hydrALAZINE (APRESOLINE) 25 MG tablet Take 1 tablet (25 mg total) by mouth 3 (three) times daily.  Marland Kitchen ibuprofen (ADVIL,MOTRIN) 400 MG tablet Take 1 tablet (400 mg total) by mouth every 8 (eight) hours as needed for fever or moderate pain.  Marland Kitchen latanoprost (XALATAN) 0.005 % ophthalmic solution Place 1 drop into both eyes at bedtime.   Marland Kitchen LORazepam (ATIVAN) 1 MG tablet TAKE 1 TABLET BY MOUTH THREE TIMES DAILY AS NEEDED  . losartan (COZAAR) 100 MG tablet Take 100 mg by mouth every morning.   . Multiple Vitamins-Minerals (CENTRUM SILVER PO) Take 1 tablet by mouth daily.   . polyethylene glycol (MIRALAX / GLYCOLAX) packet Take 17 g by mouth daily.  . potassium chloride SA (K-DUR,KLOR-CON) 20 MEQ tablet Take 1 tablet (20 mEq total) by mouth 2 (two) times daily.  . pravastatin (PRAVACHOL) 40 MG tablet Take 1 tablet by mouth at bedtime.   . sildenafil (REVATIO) 20 MG tablet Take 1-2 tablets by mouth as needed.  . traMADol (ULTRAM) 50 MG tablet Take 1 tablet by mouth 2 (two) times daily as needed.  . Wheat Dextrin (BENEFIBER) POWD Take by mouth 2 (two) times daily. Takes 1 Tbsp twice daily  . zolpidem (AMBIEN) 10 MG tablet Take 1 tablet by mouth at bedtime.   . [DISCONTINUED] furosemide (LASIX) 40 MG tablet Take 20 mg by mouth daily.   . [DISCONTINUED] hydrochlorothiazide (HYDRODIURIL) 25 MG tablet Take 25 mg by mouth daily.     Allergies:   Codeine and Tape   Social History   Socioeconomic History  . Marital status: Married    Spouse name: None  . Number of children: None  . Years of education: None  . Highest education level: None  Social Needs  . Financial resource strain: None  . Food insecurity - worry: None  . Food insecurity - inability: None  . Transportation needs - medical: None  . Transportation needs - non-medical:  None  Occupational History  . None  Tobacco Use  . Smoking status: Former Smoker    Packs/day: 1.50    Years: 30.00    Pack years: 45.00    Types: Cigarettes    Start date: 04/24/1957    Last attempt to quit: 10/15/1986    Years since quitting: 31.1  . Smokeless tobacco: Never Used  Substance and Sexual Activity  . Alcohol use: No    Alcohol/week: 0.0 oz    Comment: Used to drink  heavily at times  . Drug use: No  . Sexual activity: None  Other Topics Concern  . None  Social History Narrative   Co-dependent relationship with his 15 year old son who has drug and financial problems   Recently married to [place] on 01/02/2014     Family History:  The patient's family history includes Alcohol abuse in his sister; Alzheimer's disease in his sister; Breast cancer in his mother; Cancer in his father; Heart attack in his mother; Hypertension in his sister; Kidney disease in his sister.   ROS:   Please see the history of present illness.    Review of Systems  Constitution: Negative.  HENT: Negative.   Cardiovascular: Positive for leg swelling.  Respiratory: Negative.   Endocrine: Negative.   Hematologic/Lymphatic: Negative.   Musculoskeletal: Negative.   Gastrointestinal: Negative.   Genitourinary: Negative.   Neurological: Negative.    All other systems reviewed and are negative.   PHYSICAL EXAM:   VS:  BP 130/60   Pulse 71   Ht 5\' 9"  (1.753 m)   Wt 225 lb 3.2 oz (102.2 kg)   SpO2 93% Comment: on room air  BMI 33.26 kg/m   Physical Exam  GEN: Well nourished, well developed, in no acute distress  Neck: no JVD, carotid bruits, or masses Cardiac:RRR; no murmurs, rubs, or gallops  Respiratory:  clear to auscultation bilaterally, normal work of breathing GI: soft, nontender, nondistended, + BS Ext: Right leg below the knee swollen and erythematous left leg without cyanosis, clubbing, or edema, Good distal pulses bilaterally Neuro:  Alert and Oriented x 3,  Psych: euthymic mood,  full affect  Wt Readings from Last 3 Encounters:  12/11/17 225 lb 3.2 oz (102.2 kg)  10/21/17 235 lb 14.3 oz (107 kg)  10/19/17 230 lb (104.3 kg)      Studies/Labs Reviewed:   EKG:  EKG is not ordered today.   Recent Labs: 10/21/2017: ALT 57 10/30/2017: BUN 12; Creatinine, Ser 0.82; Hemoglobin 10.8; Platelets 638; Potassium 4.2; Sodium 127   Lipid Panel    Component Value Date/Time   CHOL 182 01/06/2009 2252   TRIG 128 01/06/2009 2252   HDL 44 01/06/2009 2252   CHOLHDL 4.1 Ratio 01/06/2009 2252   VLDL 26 01/06/2009 2252   LDLCALC 112 (H) 01/06/2009 2252    Additional studies/ records that were reviewed today include:  Arterial Dopplers reviewed that the patient brought with him and are normal  Venous Dopplers 1/7/19IMPRESSION: Negative for deep venous thrombosis in right lower extremity.     Electronically Signed   By: Markus Daft M.D.   On: 10/21/2017 14:37     ASSESSMENT:    1. Edema, unspecified type   2. Cellulitis of right lower extremity   3. Hyponatremia   4. Medication management   5. Essential hypertension   6. SVT (supraventricular tachycardia) (HCC)      PLAN:  In order of problems listed above:  Right lower extremity edema continues to be a problem despite treatment of his cellulitis he still has some erythema as well.  He was on broad-spectrum antibiotics as discussed above.  He has been followed by primary care for this.  Arterial Dopplers were normal, no evidence of DVT on venous Dopplers.  Will stop HCTZ, continue Lasix 40 mg once daily, Spironolactone 25 mg once daily, 2D echo to reassess LV and RV heart function.  Has not had one since 2013.  Continue with fluid restriction, compression stockings.  Call Monday if  no improvement.  Follow-up with Dr. Bronson Ing March 15  Hyponatremia sodium was 129 on 12/10/17 which is about his baseline.  Will recheck next week on diuretics.  Medication management to see above  Essential hypertension blood  pressure stable  SVT has been stable on diltiazem.  Medication Adjustments/Labs and Tests Ordered: Current medicines are reviewed at length with the patient today.  Concerns regarding medicines are outlined above.  Medication changes, Labs and Tests ordered today are listed in the Patient Instructions below. Patient Instructions  Medication Instructions:  Your physician has recommended you make the following change in your medication:  Stop Taking HCTZ  Start Taking Spironolactone 25 mg Daily  Increase Lasix to 40 mg Daily    Labwork: Your physician recommends that you return for lab work in: 1 Week ( March 6)    Testing/Procedures: Your physician has requested that you have an echocardiogram. Echocardiography is a painless test that uses sound waves to create images of your heart. It provides your doctor with information about the size and shape of your heart and how well your heart's chambers and valves are working. This procedure takes approximately one hour. There are no restrictions for this procedure.    Follow-Up: Your physician recommends that you schedule a follow-up appointment in: [facility] office with Dr. Bronson Ing   Any Other Special Instructions Will Be Listed Below (If Applicable). Your provider would like to get compression hose.      If you need a refill on your cardiac medications before your next appointment, please call your pharmacy.   Low-Sodium Eating Plan Sodium, which is an element that makes up salt, helps you maintain a healthy balance of fluids in your body. Too much sodium can increase your blood pressure and cause fluid and waste to be held in your body. Your health care provider or dietitian may recommend following this plan if you have high blood pressure (hypertension), kidney disease, liver disease, or heart failure. Eating less sodium can help lower your blood pressure, reduce swelling, and protect your heart, liver, and kidneys. What are tips for  following this plan? General guidelines  Most people on this plan should limit their sodium intake to 1,500-2,000 mg (milligrams) of sodium each day. Reading food labels  The Nutrition Facts label lists the amount of sodium in one serving of the food. If you eat more than one serving, you must multiply the listed amount of sodium by the number of servings.  Choose foods with less than 140 mg of sodium per serving.  Avoid foods with 300 mg of sodium or more per serving. Shopping  Look for lower-sodium products, often labeled as "low-sodium" or "no salt added."  Always check the sodium content even if foods are labeled as "unsalted" or "no salt added".  Buy fresh foods. ? Avoid canned foods and premade or frozen meals. ? Avoid canned, cured, or processed meats  Buy breads that have less than 80 mg of sodium per slice. Cooking  Eat more home-cooked food and less restaurant, buffet, and fast food.  Avoid adding salt when cooking. Use salt-free seasonings or herbs instead of table salt or sea salt. Check with your health care provider or pharmacist before using salt substitutes.  Cook with plant-based oils, such as canola, sunflower, or olive oil. Meal planning  When eating at a restaurant, ask that your food be prepared with less salt or no salt, if possible.  Avoid foods that contain MSG (monosodium glutamate). MSG is sometimes added to  [place] food, bouillon, and some canned foods. What foods are recommended? The items listed may not be a complete list. Talk with your dietitian about what dietary choices are best for you. Grains Low-sodium cereals, including oats, puffed wheat and rice, and shredded wheat. Low-sodium crackers. Unsalted rice. Unsalted pasta. Low-sodium bread. Whole-grain breads and whole-grain pasta. Vegetables Fresh or frozen vegetables. "No salt added" canned vegetables. "No salt added" tomato sauce and paste. Low-sodium or reduced-sodium tomato and vegetable  juice. Fruits Fresh, frozen, or canned fruit. Fruit juice. Meats and other protein foods Fresh or frozen (no salt added) meat, poultry, seafood, and fish. Low-sodium canned tuna and salmon. Unsalted nuts. Dried peas, beans, and lentils without added salt. Unsalted canned beans. Eggs. Unsalted nut butters. Dairy Milk. Soy milk. Cheese that is naturally low in sodium, such as ricotta cheese, fresh mozzarella, or Swiss cheese Low-sodium or reduced-sodium cheese. Cream cheese. Yogurt. Fats and oils Unsalted butter. Unsalted margarine with no trans fat. Vegetable oils such as canola or olive oils. Seasonings and other foods Fresh and dried herbs and spices. Salt-free seasonings. Low-sodium mustard and ketchup. Sodium-free salad dressing. Sodium-free light mayonnaise. Fresh or refrigerated horseradish. Lemon juice. Vinegar. Homemade, reduced-sodium, or low-sodium soups. Unsalted popcorn and pretzels. Low-salt or salt-free chips. What foods are not recommended? The items listed may not be a complete list. Talk with your dietitian about what dietary choices are best for you. Grains Instant hot cereals. Bread stuffing, pancake, and biscuit mixes. Croutons. Seasoned rice or pasta mixes. Noodle soup cups. Boxed or frozen macaroni and cheese. Regular salted crackers. Self-rising flour. Vegetables Sauerkraut, pickled vegetables, and relishes. Olives. [place] fries. Onion rings. Regular canned vegetables (not low-sodium or reduced-sodium). Regular canned tomato sauce and paste (not low-sodium or reduced-sodium). Regular tomato and vegetable juice (not low-sodium or reduced-sodium). Frozen vegetables in sauces. Meats and other protein foods Meat or fish that is salted, canned, smoked, spiced, or pickled. Bacon, ham, sausage, hotdogs, corned beef, chipped beef, packaged lunch meats, salt pork, jerky, pickled herring, anchovies, regular canned tuna, sardines, salted nuts. Dairy Processed cheese and cheese spreads.  Cheese curds. Blue cheese. Feta cheese. String cheese. Regular cottage cheese. Buttermilk. Canned milk. Fats and oils Salted butter. Regular margarine. Ghee. Bacon fat. Seasonings and other foods Onion salt, garlic salt, seasoned salt, table salt, and sea salt. Canned and packaged gravies. Worcestershire sauce. Tartar sauce. Barbecue sauce. Teriyaki sauce. Soy sauce, including reduced-sodium. Steak sauce. Fish sauce. Oyster sauce. Cocktail sauce. Horseradish that you find on the shelf. Regular ketchup and mustard. Meat flavorings and tenderizers. Bouillon cubes. Hot sauce and Tabasco sauce. Premade or packaged marinades. Premade or packaged taco seasonings. Relishes. Regular salad dressings. Salsa. Potato and tortilla chips. Corn chips and puffs. Salted popcorn and pretzels. Canned or dried soups. Pizza. Frozen entrees and pot pies. Summary  Eating less sodium can help lower your blood pressure, reduce swelling, and protect your heart, liver, and kidneys.  Most people on this plan should limit their sodium intake to 1,500-2,000 mg (milligrams) of sodium each day.  Canned, boxed, and frozen foods are high in sodium. Restaurant foods, fast foods, and pizza are also very high in sodium. You also get sodium by adding salt to food.  Try to cook at home, eat more fresh fruits and vegetables, and eat less fast food, canned, processed, or prepared foods. This information is not intended to replace advice given to you by your health care provider. Make sure you discuss any questions you have with your health care  provider. Document Released: 03/23/2002 Document Revised: 09/24/2016 Document Reviewed: 09/24/2016 Elsevier Interactive Patient Education  2018 [place], Ermalinda Barrios, [place]  12/11/2017 2:39 PM    [facility] [address], [place], [place]  [address] Phone: [phone]; Fax: [phone]

## 2017-12-17 ENCOUNTER — Ambulatory Visit (INDEPENDENT_AMBULATORY_CARE_PROVIDER_SITE_OTHER): Payer: Medicare Other | Admitting: Internal Medicine

## 2017-12-17 ENCOUNTER — Encounter: Payer: Self-pay | Admitting: Internal Medicine

## 2017-12-17 DIAGNOSIS — J3089 Other allergic rhinitis: Secondary | ICD-10-CM | POA: Diagnosis not present

## 2017-12-17 DIAGNOSIS — J61 Pneumoconiosis due to asbestos and other mineral fibers: Secondary | ICD-10-CM

## 2017-12-17 DIAGNOSIS — J302 Other seasonal allergic rhinitis: Secondary | ICD-10-CM | POA: Diagnosis not present

## 2017-12-17 MED ORDER — ALBUTEROL SULFATE HFA 108 (90 BASE) MCG/ACT IN AERS: 2.0000 | INHALATION_SPRAY | Freq: Four times a day (QID) | RESPIRATORY_TRACT | 12 refills | Status: DC | PRN

## 2017-12-17 MED ORDER — AZELASTINE HCL 0.1 % NA SOLN: NASAL | 12 refills | Status: DC

## 2017-12-17 NOTE — Patient Instructions (Addendum)
Script sent for Astelin nasal spray     1-2 puffs each nostril once or twice daily as needed for drainage.  You can keep using the Flonase.   Script sent for albuterol rescue inhaler to use 2 puffs every 6 hours if needed for asthma  You might try Breathe Right nasal strips on your nose to help hold your nostrils more open while you sleep.  Please call if we can help

## 2017-12-17 NOTE — Assessment & Plan Note (Signed)
Discussed management options. Plan- nasal strips, Astelin nasal spray, continue Flonase.

## 2017-12-17 NOTE — Assessment & Plan Note (Signed)
Stable based on symptoms and recent CXR. Plan-continue long-term surveillance

## 2017-12-17 NOTE — Progress Notes (Signed)
HPI male former smoker followed for allergic rhinitis, asthma,  history OSA, asbestos exposure/plaques, chronic insomnia Office spirometry 05/03/15- WNL- FVC 3.33/ 78%, FEV1 2.49/ 78%, r 0.75, FEF25-75% 1.92/ 69%.. ----------------------------------------------------------------------------------------------- 07/11/17- 83 year old male former smoker followed for allergic rhinitis, asthma,  history OSA/ weight loss, asbestos exposure/plaques, chronic insomnia Chest congestion for the past few weeks. Productive cough with clear mucus.    Here with his wife today Still swimming. Complains of acute illness-chest congestion for 3 weeks, cough productive scant white. Denies fever, chills or sore throat. He saw his primary physician September 6 treated with cefuroxime x 2 weeks. Then given Z-Pak. Had syncopal episode September 15-[facility] with CXR and head CT. Remote history of acoustic neuroma therapy. Saw Dr Pleas Koch September 17 given Tessalon which did not help, steroid shot. Now on Mucinex. By his home oximeter saturations running 95-96%. He has been swimming and asks permission to continue. Does get a little chilled as he finishes and dries off. CXR 06/29/17 IMPRESSION: 1. No acute findings. 2.  Aortic Atherosclerosis (ICD10-I70.0). 3. Asbestos related pleural disease identified.  12/17/17- 83 year old male former smoker followed for allergic rhinitis, asthma,  history OSA/ weight loss, asbestos exposure/plaques, chronic insomnia ----Asthma: Pt is having increased sinus issues-PCP is working with patient-continues to have increased drainage and would like CY to address this today. Pt otherwise is doing well. Recent hospital stay for cellulitis of right lower leg, discharged on Augmentin plus doxycycline Ultrasound x2 was negative for DVT. Main complaint is nasal congestion with postnasal drainage.  Steroid therapy for his leg "made me crazy".  Using [employer], Mucinex, Allegra, Flonase.  Some cough.   Clear mucus. CXR 10/21/17 IMPRESSION: 1. No acute findings. 2. Asbestos related pleural disease. 3.  Aortic atherosclerosis (ICD10-170.0).  ROS See HPI  + = positive Constitutional:   No-   weight loss, night sweats, fevers, chills, fatigue, lassitude. HEENT:   + headaches,  No -difficulty swallowing, tooth/dental problems, sore throat,       No-  sneezing, itching, ear ache,  +nasal congestion, +post nasal drip,  CV:  No-   chest pain, orthopnea, PND, swelling in lower extremities, no-anasarca, dizziness, palpitations Resp: No-   shortness of breath with exertion or at rest.              +  productive cough,  No non-productive cough,  No-  coughing up of blood.              No-   change in color of mucus.  No- wheezing.   Skin: No-   rash or lesions. GI:  No-   heartburn, indigestion, abdominal pain, nausea, vomiting,             GU:  MS:  + joint pain or swelling. .  + back pain. Neuro-  nothing unusual  Psych:  No- change in mood or affect. No depression or anxiety.  No memory loss.  OBJ  General- Alert, Oriented, Affect-appropriate, Distress- none acute, + overweight, +talkative  Skin- rash-none, lesions- none, excoriation- none,  Lymphadenopathy- none Head- atraumatic            Eyes- Gross vision intact, PERRLA, conjunctivae clear secretions            Ears- +hard of hearing/hearing aid            Nose- + turbinate edema, no-Septal dev, mucus, polyps, erosion, perforation             Throat- Mallampati II ,  mucosa clear , drainage- none, tonsils- atrophic. Dental repair. Neck- flexible , trachea midline, no stridor , thyroid nl, carotid no bruit Chest - symmetrical excursion , unlabored           Heart/CV- slow RRR , no murmur , no gallop  , no rub, nl s1 s2                           - JVD- none , edema + chronic right lower leg, varices- none           Lung- clear to P&A, wheeze- none, cough + rattling , dullness-none, rub- none. +I cannot hear fine crackles or rub.            Chest wall-  Abd-  Br/ Gen/ Rectal- Not done, not indicated Extrem- +edema and erythema right calf/ankle/ sockline Neuro- grossly intact to observation. Speech is clear.

## 2017-12-18 ENCOUNTER — Other Ambulatory Visit: Payer: Self-pay | Admitting: Physician Assistant

## 2017-12-18 ENCOUNTER — Telehealth: Payer: Self-pay | Admitting: Internal Medicine

## 2017-12-18 ENCOUNTER — Other Ambulatory Visit: Payer: Self-pay | Admitting: Radiology

## 2017-12-18 DIAGNOSIS — R000 Tachycardia, unspecified: Secondary | ICD-10-CM

## 2017-12-18 DIAGNOSIS — Z79899 Other long term (current) drug therapy: Secondary | ICD-10-CM | POA: Diagnosis not present

## 2017-12-18 DIAGNOSIS — R609 Edema, unspecified: Secondary | ICD-10-CM

## 2017-12-18 LAB — BASIC METABOLIC PANEL
BUN: 13 mg/dL (ref 7–25)
CALCIUM: 9.3 mg/dL (ref 8.6–10.3)
CO2: 26 mmol/L (ref 20–32)
Chloride: 96 mmol/L — ABNORMAL LOW (ref 98–110)
Creat: 1 mg/dL (ref 0.70–1.11)
Glucose, Bld: 67 mg/dL (ref 65–139)
POTASSIUM: 5.3 mmol/L (ref 3.5–5.3)
SODIUM: 129 mmol/L — AB (ref 135–146)

## 2017-12-18 NOTE — Telephone Encounter (Signed)
Spoke with pt, states since starting Proair and Astelin yesterday has had itching on his upper back.  Pt states that per his wife it looks like he is "about to break out in hives", states it's hard to tell if his back is red from the scratching he is doing to the area or if it it's from the original irritant. No open sores or particular welts per pt wife.    Pt took astelin and proair approx 1 hour apart this morning to see if this worsened the itching- it did not.  Pt denies new soap/body wash, shampoo, laundry detergent, sheets, clothing.  Denies starting any other medications.  States itching has been localized to upper back.   I advised pt to hold medications until he hears from [place].   Denies any respiratory complaints.  Requesting recs from CY.  Pt uses [facility] in [place].  CY please advise.  Thanks.

## 2017-12-18 NOTE — Telephone Encounter (Signed)
Astelin is an antihistamine, functioning like benadryl. I would expect it to help, not cause hives. Proair is albuterol. I wouldn't expect either one to cause hives. This might actually turn out to be dry skin or something else, with the area now irritated by scratching.  Ok to stay off these meds for a few days,if he wants to. Treat the rash gently and avoid scratching. If It gets worse, let PCP or a dermatologist  see it.

## 2017-12-18 NOTE — Telephone Encounter (Signed)
Spoke with patient he is aware of results and verbalized understanding. Nothing further needed.

## 2017-12-20 ENCOUNTER — Other Ambulatory Visit: Payer: Self-pay | Admitting: Cardiovascular Disease

## 2017-12-20 DIAGNOSIS — E782 Mixed hyperlipidemia: Secondary | ICD-10-CM | POA: Diagnosis not present

## 2017-12-20 DIAGNOSIS — L299 Pruritus, unspecified: Secondary | ICD-10-CM | POA: Diagnosis not present

## 2017-12-20 DIAGNOSIS — E871 Hypo-osmolality and hyponatremia: Secondary | ICD-10-CM | POA: Diagnosis not present

## 2017-12-20 DIAGNOSIS — I10 Essential (primary) hypertension: Secondary | ICD-10-CM | POA: Diagnosis not present

## 2017-12-20 DIAGNOSIS — J302 Other seasonal allergic rhinitis: Secondary | ICD-10-CM | POA: Diagnosis not present

## 2017-12-20 DIAGNOSIS — R600 Localized edema: Secondary | ICD-10-CM | POA: Diagnosis not present

## 2017-12-25 DIAGNOSIS — E871 Hypo-osmolality and hyponatremia: Secondary | ICD-10-CM | POA: Diagnosis not present

## 2017-12-25 DIAGNOSIS — R7301 Impaired fasting glucose: Secondary | ICD-10-CM | POA: Diagnosis not present

## 2017-12-25 DIAGNOSIS — I82402 Acute embolism and thrombosis of unspecified deep veins of left lower extremity: Secondary | ICD-10-CM | POA: Diagnosis not present

## 2017-12-25 DIAGNOSIS — E782 Mixed hyperlipidemia: Secondary | ICD-10-CM | POA: Diagnosis not present

## 2017-12-25 DIAGNOSIS — I10 Essential (primary) hypertension: Secondary | ICD-10-CM | POA: Diagnosis not present

## 2017-12-26 ENCOUNTER — Ambulatory Visit (INDEPENDENT_AMBULATORY_CARE_PROVIDER_SITE_OTHER): Payer: Medicare Other

## 2017-12-26 ENCOUNTER — Other Ambulatory Visit: Payer: Self-pay

## 2017-12-26 DIAGNOSIS — R000 Tachycardia, unspecified: Secondary | ICD-10-CM | POA: Diagnosis not present

## 2017-12-26 DIAGNOSIS — R609 Edema, unspecified: Secondary | ICD-10-CM

## 2017-12-27 ENCOUNTER — Encounter: Payer: Self-pay | Admitting: Cardiovascular Disease

## 2017-12-27 ENCOUNTER — Ambulatory Visit (INDEPENDENT_AMBULATORY_CARE_PROVIDER_SITE_OTHER): Payer: Medicare Other | Admitting: Cardiovascular Disease

## 2017-12-27 ENCOUNTER — Other Ambulatory Visit: Payer: Self-pay

## 2017-12-27 VITALS — BP 146/71 | HR 78 | Ht 69.5 in | Wt 224.0 lb

## 2017-12-27 DIAGNOSIS — I350 Nonrheumatic aortic (valve) stenosis: Secondary | ICD-10-CM

## 2017-12-27 DIAGNOSIS — I471 Supraventricular tachycardia: Secondary | ICD-10-CM | POA: Diagnosis not present

## 2017-12-27 DIAGNOSIS — R609 Edema, unspecified: Secondary | ICD-10-CM

## 2017-12-27 DIAGNOSIS — I10 Essential (primary) hypertension: Secondary | ICD-10-CM

## 2017-12-27 DIAGNOSIS — Z79899 Other long term (current) drug therapy: Secondary | ICD-10-CM | POA: Diagnosis not present

## 2017-12-27 DIAGNOSIS — E871 Hypo-osmolality and hyponatremia: Secondary | ICD-10-CM | POA: Diagnosis not present

## 2017-12-27 DIAGNOSIS — L03115 Cellulitis of right lower limb: Secondary | ICD-10-CM | POA: Diagnosis not present

## 2017-12-27 MED ORDER — FUROSEMIDE 20 MG PO TABS: 20.0000 mg | ORAL_TABLET | ORAL | Status: DC | PRN

## 2017-12-27 MED ORDER — POTASSIUM CHLORIDE CRYS ER 20 MEQ PO TBCR: 20.0000 meq | EXTENDED_RELEASE_TABLET | Freq: Every day | ORAL | Status: DC

## 2017-12-27 NOTE — Patient Instructions (Addendum)
Medication Instructions:   Decrease Lasix to as needed only.   Decrease Potassium to once daily.   May remain off of the Spironolactone.    Continue all other medications.    Labwork:  BMET - order give today.   Please do in about 2 weeks.   Office will contact with results via phone or letter.    Testing/Procedures: none  Follow-Up: Your physician wants you to follow up in:  1 year.  You will receive a reminder letter in the mail one-two months in advance.  If you don't receive a letter, please call our office to schedule the follow up appointment   Any Other Special Instructions Will Be Listed Below (If Applicable). Please have primary MD monitor blood pressure.    If you need a refill on your cardiac medications before your next appointment, please call your pharmacy.

## 2017-12-27 NOTE — Progress Notes (Signed)
SUBJECTIVE: The patient presents for follow-up after being hospitalized in January for right lower extremity cellulitis.  Dopplers were negative for DVT.  He also underwent arterial studies which were normal. He was placed on broad-spectrum antimicrobial therapy.  He had some hyponatremia and hydrochlorothiazide was initially held and then discontinued by Gerrianne Scale PA-C.   He also has a history of PSVT and hypertension.  He has a history of noncardiac chest pain and hyperlipidemia as well.  He is known for eating out a lot and consuming foods which are laden with sodium.  He and his wife have several questions regarding his hospitalization in his right leg.  He said his PCP prescribed steroids for 12 days and this made him very anxious and irritable.  His leg swelling has considerably resolved but it is not gone back to normal yet they have questions as to how long it will take to completely resolve.  We had a long discussion regarding this.  Echocardiogram performed yesterday which I reviewed demonstrated normal left ventricular systolic function and regional wall motion, LVEF 60-65%, moderate LVH, grade 1 diastolic dysfunction, and probably mild calcific aortic stenosis.  He has been taking Lasix 40 mg daily and he said all it does is make him urinate.  He feels no better taking it.  He keeps his leg elevated and on a pillow while sleeping.  He wants to know when he can go back to swimming.  He wonders how he got the infection in the first place.   Review of Systems: As per "subjective", otherwise negative.  Allergies  Allergen Reactions  . Prednisone     Pt is high functioning and out of sorts.  . Codeine Other (See Comments)    REACTION: groggy  . Tape Other (See Comments)    Skin tears, use paper tape    Current Outpatient Medications  Medication Sig Dispense Refill  . acetaminophen (TYLENOL) 325 MG tablet Take 325 mg by mouth every 6 (six) hours as needed for pain.      Marland Kitchen albuterol (PROAIR HFA) 108 (90 Base) MCG/ACT inhaler Inhale 2 puffs into the lungs every 6 (six) hours as needed for wheezing or shortness of breath. 18 g 12  . aspirin EC 81 MG tablet Take 81 mg by mouth daily.    Marland Kitchen azelastine (ASTELIN) 0.1 % nasal spray 1-2 puffs each nostril every 8 hours if needed for drainage 30 mL 12  . Calcium Carb-Vit D-C-E-Mineral (OS-CAL ULTRA) 600 MG TABS Take 1 tablet by mouth daily.     Marland Kitchen CARTIA XT 120 MG 24 hr capsule TAKE ONE CAPSULE BY MOUTH TWICE DAILY 180 capsule 3  . esomeprazole (NEXIUM) 40 MG capsule Take 40 mg by mouth every morning.     . fexofenadine (ALLEGRA) 180 MG tablet Take 180 mg by mouth daily.    . fluticasone (FLONASE) 50 MCG/ACT nasal spray Place 2 sprays into both nostrils daily. (Patient taking differently: Place 2 sprays into both nostrils daily as needed for allergies. ) 18.2 g 12  . furosemide (LASIX) 40 MG tablet Take 1 tablet (40 mg total) by mouth daily. 90 tablet 3  . hydrALAZINE (APRESOLINE) 25 MG tablet Take 25 mg by mouth 3 (three) times daily.    Marland Kitchen ibuprofen (ADVIL,MOTRIN) 400 MG tablet Take 1 tablet (400 mg total) by mouth every 8 (eight) hours as needed for fever or moderate pain. 30 tablet 0  . latanoprost (XALATAN) 0.005 % ophthalmic solution Place 1  drop into both eyes at bedtime.     Marland Kitchen LORazepam (ATIVAN) 1 MG tablet TAKE 1 TABLET BY MOUTH THREE TIMES DAILY AS NEEDED 180 tablet 0  . losartan (COZAAR) 100 MG tablet Take 100 mg by mouth every morning.     . Multiple Vitamins-Minerals (CENTRUM SILVER PO) Take 1 tablet by mouth daily.     . polyethylene glycol (MIRALAX / GLYCOLAX) packet Take 17 g by mouth daily.    . potassium chloride SA (K-DUR,KLOR-CON) 20 MEQ tablet Take 1 tablet (20 mEq total) by mouth 2 (two) times daily. 20 tablet 0  . potassium chloride SA (KLOR-CON M20) 20 MEQ tablet Take 1 tablet (20 mEq total) by mouth 2 (two) times daily. 180 tablet 2  . pravastatin (PRAVACHOL) 40 MG tablet Take 1 tablet by mouth at  bedtime.     . sildenafil (REVATIO) 20 MG tablet Take 1-2 tablets by mouth as needed.  1  . spironolactone (ALDACTONE) 25 MG tablet Take 1 tablet (25 mg total) by mouth daily. 90 tablet 3  . traMADol (ULTRAM) 50 MG tablet Take 1 tablet by mouth 2 (two) times daily as needed.    . Wheat Dextrin (BENEFIBER) POWD Take by mouth 2 (two) times daily. Takes 1 Tbsp twice daily    . zolpidem (AMBIEN) 10 MG tablet Take 1 tablet by mouth at bedtime.      No current facility-administered medications for this visit.     Past Medical History:  Diagnosis Date  . Abdominal aorta injury    Normal size, ultrasound, March 17,2011  . Allergic rhinitis   . Arthritis   . Asbestos exposure Jan 2001   Hx of asbestos related pleural plaques  . Balance problem   . BPH (benign prostatic hyperplasia)   . Complication of anesthesia    "trouble waking up"  . Easy bruisability   . ED (erectile dysfunction)   . Ejection fraction    60%, echo, 2008, mild RV dysfunction  . GERD (gastroesophageal reflux disease)   . H/O hiatal hernia   . Hearing loss in left ear   . History of shingles    "mild"  . Hyperlipidemia    under control  . Hypertension December 29, 2009   renal artery Doppler , normal  12/2009  . Overactive bladder   . Sleep apnea    was dx 10 years ago reports lost a lot of weight and no longer machine  . Tinnitus     Past Surgical History:  Procedure Laterality Date  . BACK SURGERY  2014   herniated L1, L2   Dr Carloyn Manner  . BRAIN SURGERY    . CEREBRAL EMBOLIZATION  12/2011   "radiation therapy-did not work"  . CHOLECYSTECTOMY  6/98  . COLONOSCOPY  09/2009   Dr. Lindalou Hose: normal, internal hemorrhoids   . COLONOSCOPY N/A 02/28/2017   Procedure: COLONOSCOPY;  Surgeon: [name], MD;  Location: [facility];  Service: Endoscopy;  Laterality: N/A;  730   . CRANIECTOMY FOR EXCISION OF ACOUSTIC NEUROMA  3/95  . RADIOLOGY WITH ANESTHESIA N/A 07/07/2014   Procedure: EMBOLIZATION;  Surgeon: Rob Hickman, MD;  Location: [place];  Service: Radiology;  Laterality: N/A;  . TOTAL KNEE ARTHROPLASTY Left 07/06/2013   Procedure: LEFT TOTAL KNEE ARTHROPLASTY;  Surgeon: Gearlean Alf, MD;  Location: [facility];  Service: Orthopedics;  Laterality: Left;  . TOTAL KNEE ARTHROPLASTY Right 01/18/2014   Procedure: RIGHT TOTAL KNEE ARTHROPLASTY;  Surgeon: Gearlean Alf,  MD;  Location: [facility];  Service: Orthopedics;  Laterality: Right;  . TRIGGER FINGER RELEASE  2003   (thumb) middle finger (2006)    Social History   Socioeconomic History  . Marital status: Married    Spouse name: Not on file  . Number of children: Not on file  . Years of education: Not on file  . Highest education level: Not on file  Social Needs  . Financial resource strain: Not on file  . Food insecurity - worry: Not on file  . Food insecurity - inability: Not on file  . Transportation needs - medical: Not on file  . Transportation needs - non-medical: Not on file  Occupational History  . Not on file  Tobacco Use  . Smoking status: Former Smoker    Packs/day: 1.50    Years: 30.00    Pack years: 45.00    Types: Cigarettes    Start date: 04/24/1957    Last attempt to quit: 10/15/1986    Years since quitting: 31.2  . Smokeless tobacco: Never Used  Substance and Sexual Activity  . Alcohol use: No    Alcohol/week: 0.0 oz    Comment: Used to drink heavily at times  . Drug use: No  . Sexual activity: Not on file  Other Topics Concern  . Not on file  Social History Narrative   Co-dependent relationship with his 26 year old son who has drug and financial problems   Recently married to McCalla on 01/02/2014     Vitals:   12/27/17 1249  BP: (!) 146/71  Pulse: 78  SpO2: 98%  Weight: 224 lb (101.6 kg)  Height: 5' 9.5" (1.765 m)    Wt Readings from Last 3 Encounters:  12/27/17 224 lb (101.6 kg)  12/17/17 224 lb 3.2 oz (101.7 kg)  12/11/17 225 lb 3.2 oz (102.2 kg)     PHYSICAL EXAM General: NAD HEENT:  Normal. Neck: No JVD, no thyromegaly. Lungs: Clear to auscultation bilaterally with normal respiratory effort. CV: Regular rate and rhythm, normal S1/S2, no G3/T5, soft systolic murmur over right upper sternal border.  Trace right lower extremity and periankle edema.  No carotid bruit.   Abdomen: Soft, nontender, no distention.  Neurologic: Alert and oriented.  Psych: Normal affect. Skin: Normal. Musculoskeletal: No gross deformities.    ECG: Most recent ECG reviewed.   Labs: Lab Results  Component Value Date/Time   K 5.3 12/18/2017 10:44 AM   BUN 13 12/18/2017 10:44 AM   CREATININE 1.00 12/18/2017 10:44 AM   ALT 57 10/21/2017 01:14 PM   TSH 1.617 12/16/2007 10:38 PM   HGB 10.8 (L) 10/30/2017 05:25 AM     Lipids: Lab Results  Component Value Date/Time   LDLCALC 112 (H) 01/06/2009 10:52 PM   CHOL 182 01/06/2009 10:52 PM   TRIG 128 01/06/2009 10:52 PM   HDL 44 01/06/2009 10:52 PM       ASSESSMENT AND PLAN: 1.  PSVT: Symptomatically stable on long-acting diltiazem 120 mg daily.  No changes.  2.  Hypertension: Mildly elevated today.  He takes long-acting diltiazem, hydralazine 25 mg 3 times daily, spironolactone 25 mg and losartan 100 mg daily.  It was normal at last visit.  I will monitor.  3.  Hyperlipidemia: Continue pravastatin 40 mg.  4.  Right lower extremity cellulitis with swelling: He is on Lasix 40 mg daily with supplemental potassium.  This will provide no benefit as I explained to him that only time will lead to progressive  soft tissue healing given the extent of his previous cellulitis.  I told him to take Lasix 20 mg as needed and to cut his potassium dose in half.  I told him to keep his right leg elevated.  I do not recommend compression stockings at this time.  5.  Hyponatremia: Sodium was 129 on 12/18/17.  As I am discontinuing daily Lasix, I will repeat a basic metabolic panel in 2 weeks.    6.  Mild calcific aortic stenosis: Symptomatically stable.  I will  monitor clinically and with surveillance echocardiography.   Disposition: Follow up 1 year.   Time spent: 40 minutes, of which greater than 50% was spent reviewing symptoms, relevant blood tests and studies, and discussing management plan with the patient.   Kate Sable, M.D., F.A.C.C.

## 2018-01-01 DIAGNOSIS — L570 Actinic keratosis: Secondary | ICD-10-CM | POA: Diagnosis not present

## 2018-01-01 DIAGNOSIS — B353 Tinea pedis: Secondary | ICD-10-CM | POA: Diagnosis not present

## 2018-01-01 DIAGNOSIS — L299 Pruritus, unspecified: Secondary | ICD-10-CM | POA: Diagnosis not present

## 2018-01-01 DIAGNOSIS — R600 Localized edema: Secondary | ICD-10-CM | POA: Diagnosis not present

## 2018-01-10 ENCOUNTER — Encounter: Payer: Self-pay | Admitting: Infectious Diseases

## 2018-01-10 DIAGNOSIS — I10 Essential (primary) hypertension: Secondary | ICD-10-CM | POA: Diagnosis not present

## 2018-01-10 DIAGNOSIS — E876 Hypokalemia: Secondary | ICD-10-CM | POA: Diagnosis not present

## 2018-01-10 DIAGNOSIS — I82402 Acute embolism and thrombosis of unspecified deep veins of left lower extremity: Secondary | ICD-10-CM | POA: Diagnosis not present

## 2018-01-16 DIAGNOSIS — L299 Pruritus, unspecified: Secondary | ICD-10-CM | POA: Diagnosis not present

## 2018-01-16 DIAGNOSIS — B353 Tinea pedis: Secondary | ICD-10-CM | POA: Diagnosis not present

## 2018-01-25 ENCOUNTER — Other Ambulatory Visit: Payer: Self-pay | Admitting: Internal Medicine

## 2018-01-27 NOTE — Telephone Encounter (Signed)
Ok to refill x 6 months 

## 2018-01-27 NOTE — Telephone Encounter (Signed)
CY Please advise on refill. Thanks.  

## 2018-02-04 ENCOUNTER — Ambulatory Visit: Payer: Medicare Other | Admitting: Cardiovascular Disease

## 2018-02-11 DIAGNOSIS — H9313 Tinnitus, bilateral: Secondary | ICD-10-CM | POA: Diagnosis not present

## 2018-02-11 DIAGNOSIS — H6123 Impacted cerumen, bilateral: Secondary | ICD-10-CM | POA: Diagnosis not present

## 2018-02-11 DIAGNOSIS — S56221A Laceration of other flexor muscle, fascia and tendon at forearm level, right arm, initial encounter: Secondary | ICD-10-CM | POA: Diagnosis not present

## 2018-02-11 DIAGNOSIS — T81719A Complication of unspecified artery following a procedure, not elsewhere classified, initial encounter: Secondary | ICD-10-CM | POA: Diagnosis not present

## 2018-02-11 DIAGNOSIS — H60503 Unspecified acute noninfective otitis externa, bilateral: Secondary | ICD-10-CM | POA: Diagnosis not present

## 2018-02-11 DIAGNOSIS — H903 Sensorineural hearing loss, bilateral: Secondary | ICD-10-CM | POA: Diagnosis not present

## 2018-02-12 ENCOUNTER — Other Ambulatory Visit: Payer: Self-pay | Admitting: Cardiovascular Disease

## 2018-02-19 ENCOUNTER — Ambulatory Visit (INDEPENDENT_AMBULATORY_CARE_PROVIDER_SITE_OTHER): Payer: Medicare Other | Admitting: Infectious Diseases

## 2018-02-19 ENCOUNTER — Encounter: Payer: Self-pay | Admitting: Infectious Diseases

## 2018-02-19 DIAGNOSIS — L03115 Cellulitis of right lower limb: Secondary | ICD-10-CM | POA: Diagnosis not present

## 2018-02-19 DIAGNOSIS — M7989 Other specified soft tissue disorders: Secondary | ICD-10-CM

## 2018-02-19 NOTE — Patient Instructions (Addendum)
Follow up as needed for now.   Please let me know if you have worsening of this area that is like what you experienced before.   Will get you back in to see Vascular and Vein Specialist as I worry about damage to your veins after this infection.   Please sign up with MyChart to access your labs and set up email communication with our clinic for non-urgent medical concerns.

## 2018-02-19 NOTE — Progress Notes (Addendum)
Patient: Julian Fowler  DOB: 03-Nov-1934 MRN: [id number] PCP: Julian Labrum, MD  Referring Provider: Judd Lien, MD (P# [phone] ext [phone], [phone])  Chief Complaint  Patient presents with  . New Patient (Initial Visit)    lower leg swelling, cellulitis      Patient Active Problem List   Diagnosis Date Noted  . Localized swelling of lower extremity 02/19/2018  . Asbestosis ([place]) 08/14/2017  . Rectal bleeding 01/24/2017  . Hemorrhoids 12/18/2016  . BRBPR (bright red blood per rectum) 12/18/2016  . 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 Peripheral edema 10/15/2013  . Hyponatremia 07/07/2013  . Hypokalemia 07/07/2013  . OA (osteoarthritis) of knee 07/06/2013  . Preop cardiovascular exam 06/26/2013  . Chronic insomnia 09/29/2012  . Cardiac murmur 08/27/2012  . Hearing impaired 04/10/2012  . Easy bruisability   . Anxiety   . Hyperlipidemia   . Abdominal aorta injury   . Asbestos exposure   . Overactive bladder   . ED (erectile dysfunction)   . SVT (supraventricular tachycardia) ([place])   . OSA (obstructive sleep apnea)   . Chest tightness   . Ejection fraction   . Essential hypertension 12/29/2009  . Syncope 09/14/2009  . TOBACCO ABUSE, HX OF 07/15/2009  . BACK PAIN 06/22/2009  . INTESTINAL GAS 06/22/2009  . 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 Seasonal and perennial allergic rhinitis 01/15/2008  . ECZEMA 11/18/2007  . Asthma, mild intermittent, well-controlled 11/03/2007  . GERD 04/14/2007  . ARTHRITIS 04/14/2007  . KNEE PAIN, LEFT 04/14/2007     Subjective:  Julian Fowler is a 83 y.o. Caucasian male with past medical history detailed below referred to Infectious Disease for evaluation of his recurrent swelling after cellulitis infection.   He has since January 2019 been working with his primary care provider for management of this condition. Normally an avid swimmer (swims about 2 miles a day) up until January  where one Friday he went swimming at the [facility] pool and the following Saturday he experienced pain in the back of his right leg along with shaking chills, fever to 102 degrees. He went to the ED in [facility] and was treated with Tamiflu as it was thought he had a flu-like illness. The swelling and bright red discoloration worsened at home and continue to spread up beyond his knee to mid thigh. He was admitted to [facility] 10/21/17 through 10/30/17 for worsening right lower extremity cellulitis where he was admitted to [facility] 9 days of IV antibiotics. He received initially a dose of vancomycin/ceftriaxone in the ED and was transitioned to IV ancef alone. He had improvement in swelling, recession of redness and defervescence with normalization of wbc's on this therapy and discharged on 10 more days of Augmentin/Doxycycline.  He after this was given a course of prednisone as there was concern for some vasculitis however he did not tolerate this well.   His wife has a lot of pictures with her today that demonstrate the degree of impressive redness, swelling and peeling of the skin that ensued after he received treatment for this. Presently he does not report ongoing fevers, night sweats or malaise following treatment. No other antibiotics since January/February. His biggest complaint is that since then he has had ongoing daily trouble with swelling and purple/redness discoloration of his right ankle/foot. Has seen dermatology and reportedly told to wear compression stocking. Has had ABIs recently where his Right ABI 1.1 but TBI 0.56 indicating  some potential mild PAD, left ABI 1.1 TBI 0.80. No venous reflux studies were done. He does not express trouble with varicosities.   He and his wife feel he still has infection going on in his foot/ankle. He is also anxious to get back in the pool again and would like to discuss when he can do this.   Review of Systems  All other systems reviewed and are  negative.   Past Medical History:  Diagnosis Date  . Abdominal aorta injury    Normal size, ultrasound, March 17,2011  . Allergic rhinitis   . Arthritis   . Asbestos exposure Jan 2001   Hx of asbestos related pleural plaques  . Balance problem   . BPH (benign prostatic hyperplasia)   . Complication of anesthesia    "trouble waking up"  . Easy bruisability   . ED (erectile dysfunction)   . Ejection fraction    60%, echo, 2008, mild RV dysfunction  . GERD (gastroesophageal reflux disease)   . H/O hiatal hernia   . Hearing loss in left ear   . History of shingles    "mild"  . Hyperlipidemia    under control  . Hypertension December 29, 2009   renal artery Doppler , normal  12/2009  . Overactive bladder   . Sleep apnea    was dx 10 years ago reports lost a lot of weight and no longer machine  . Tinnitus     Outpatient Medications Prior to Visit  Medication Sig Dispense Refill  . acetaminophen (TYLENOL) 325 MG tablet Take 325 mg by mouth every 6 (six) hours as needed for pain.     Marland Kitchen albuterol (PROAIR HFA) 108 (90 Base) MCG/ACT inhaler Inhale 2 puffs into the lungs every 6 (six) hours as needed for wheezing or shortness of breath. 18 g 12  . aspirin EC 81 MG tablet Take 81 mg by mouth daily.    Marland Kitchen azelastine (ASTELIN) 0.1 % nasal spray 1-2 puffs each nostril every 8 hours if needed for drainage 30 mL 12  . Calcium Carb-Vit D-C-E-Mineral (OS-CAL ULTRA) 600 MG TABS Take 1 tablet by mouth daily.     Marland Kitchen CARTIA XT 120 MG 24 hr capsule TAKE ONE CAPSULE BY MOUTH TWICE DAILY 180 capsule 3  . esomeprazole (NEXIUM) 40 MG capsule Take 40 mg by mouth every morning.     . fexofenadine (ALLEGRA) 180 MG tablet Take 180 mg by mouth daily.    . fluticasone (FLONASE) 50 MCG/ACT nasal spray Place 2 sprays into both nostrils daily. (Patient taking differently: Place 2 sprays into both nostrils daily as needed for allergies. ) 18.2 g 12  . furosemide (LASIX) 20 MG tablet Take 1 tablet (20 mg total) by  mouth as needed.    . hydrALAZINE (APRESOLINE) 25 MG tablet Take 25 mg by mouth 3 (three) times daily.    . hydrALAZINE (APRESOLINE) 25 MG tablet TAKE 1 TABLET BY MOUTH THREE TIMES DAILY 90 tablet 11  . ibuprofen (ADVIL,MOTRIN) 400 MG tablet Take 1 tablet (400 mg total) by mouth every 8 (eight) hours as needed for fever or moderate pain. 30 tablet 0  . latanoprost (XALATAN) 0.005 % ophthalmic solution Place 1 drop into both eyes at bedtime.     Marland Kitchen LORazepam (ATIVAN) 1 MG tablet TAKE 1 TABLET BY MOUTH THREE TIMES DAILY AS NEEDED 180 tablet 1  . losartan (COZAAR) 100 MG tablet Take 100 mg by mouth every morning.     . Multiple Vitamins-Minerals (  CENTRUM SILVER PO) Take 1 tablet by mouth daily.     . polyethylene glycol (MIRALAX / GLYCOLAX) packet Take 17 g by mouth daily.    . potassium chloride SA (KLOR-CON M20) 20 MEQ tablet Take 1 tablet (20 mEq total) by mouth daily.    . pravastatin (PRAVACHOL) 40 MG tablet Take 1 tablet by mouth at bedtime.     . sildenafil (REVATIO) 20 MG tablet Take 1-2 tablets by mouth as needed.  1  . traMADol (ULTRAM) 50 MG tablet Take 1 tablet by mouth 2 (two) times daily as needed.    . Wheat Dextrin (BENEFIBER) POWD Take by mouth 2 (two) times daily. Takes 1 Tbsp twice daily    . zolpidem (AMBIEN) 10 MG tablet Take 1 tablet by mouth at bedtime.      No facility-administered medications prior to visit.      Allergies  Allergen Reactions  . Prednisone     Pt is high functioning and out of sorts.  . Codeine Other (See Comments)    REACTION: groggy  . Tape Other (See Comments)    Skin tears, use paper tape    Social History   Tobacco Use  . Smoking status: Former Smoker    Packs/day: 1.50    Years: 30.00    Pack years: 45.00    Types: Cigarettes    Start date: 04/24/1957    Last attempt to quit: 10/15/1986    Years since quitting: 31.3  . Smokeless tobacco: Never Used  Substance Use Topics  . Alcohol use: No    Alcohol/week: 0.0 oz    Comment: Used to  drink heavily at times  . Drug use: No    Family History  Problem Relation Age of Onset  . Cancer Father        oral cancer  . Breast cancer Mother   . Heart attack Mother   . Hypertension Sister        Bypass x4  . Alcohol abuse Sister   . Alzheimer's disease Sister   . Kidney disease Sister   . Colon cancer Neg Hx     Objective:   Vitals:   02/19/18 1341  BP: (!) 163/78  Pulse: 66  Temp: 98.6 F (37 C)  TempSrc: Oral  Weight: 109.3 kg (241 lb)   Body mass index is 35.08 kg/m.  Physical Exam  Constitutional: He is oriented to person, place, and time. He appears well-developed.  HENT:  Mouth/Throat: No oral lesions. Normal dentition. No dental caries.  Hard of hearing  Eyes: Pupils are equal, round, and reactive to light. No scleral icterus.  Cardiovascular: Normal rate, regular rhythm, normal heart sounds and intact distal pulses.  Pulmonary/Chest: Effort normal and breath sounds normal.  Abdominal: Soft. He exhibits no distension. There is no tenderness.  Musculoskeletal:  Right leg - dependent edema 2+ extending from mid shin to all toes. No interweb lesions or signs of tinea. No lesions present today. Red/purple discoloration is diffuse and in dependent positions that does blanch easily. Old pictures of acute cellulitis flare from January are shown below.   Lymphadenopathy:    He has no cervical adenopathy.  Neurological: He is alert and oriented to person, place, and time.  Skin: Skin is warm and dry. No rash noted.  Vitals reviewed.   10/22/2017     10/24/17:   10/28/17:   10/30/17:    Lab Results: Lab Results  Component Value Date   WBC 12.8 (H) 10/30/2017  HGB 10.8 (L) 10/30/2017   HCT 32.0 (L) 10/30/2017   MCV 89.9 10/30/2017   PLT 638 (H) 10/30/2017    Lab Results  Component Value Date   CREATININE 1.00 12/18/2017   BUN 13 12/18/2017   NA 129 (L) 12/18/2017   K 5.3 12/18/2017   CL 96 (L) 12/18/2017   CO2 26 12/18/2017    Lab  Results  Component Value Date   ALT 57 10/21/2017   AST 37 10/21/2017   ALKPHOS 83 10/21/2017   BILITOT 0.3 10/21/2017     Assessment & Plan:   Problem List Items Addressed This Visit      Other   RESOLVED: Cellulitis    No signs of recurrent cellulitis today on exam. Cool to the touch and non-painful.       Localized swelling of lower extremity    He has experienced recurrent swelling of the RLE following what appears to have been a pretty severe bout of cellulitis that involved his entire right foot spread up to the mid thigh. It was remarkably red and the peeling that occurred following treatment almost makes me wonder about the possibility of staphylococcal scalded skin syndrome reaction. I feel this is less likely directly related to the pool water as it was amenable to Ancef alone targeting mostly skin organisms; provides good reassurance that there was no atypical, mycobacterial or pseudomonal involvement. I am not certain as to the cause of his cellulitis exactly however - it is possible he had a small crack in his skin or trauma he was unaware about that allowed invasion of bacteria. Of note he did have a CABG x 4 per his record and likely required a saphenous vein harvest and I wonder if he had this to this leg which would make him more susceptible to swelling/damage.   He in the past struggled with swelling to this leg and saw Dr. Oneida Alar in 2017 with instructions to follow up PRN then. I am concerned about some damage that the infection could have caused to his lymphatic system or veins which may warrant consideration of ablation therapy or other intervention. I agree with the compression therapy and recommended a stocking donner to help get them on for 20-30 mmHg compression if possible as they struggle getting this on now.   Will refer back to vascular and vein to consider venous evaluation and intervention that may be helpful. In the mean time I  would not recommend continued  antibiotics at this time. Recommended adequate moisturizer to preserve integrity of skin and prevent cuts/scrapes or cracks. He has no active lesions today and I do not see a contraindication to stay out of the pool for his exercise if he is considering re-trial of this.   Should he have recurrent evidence of infection (bright red, warm, painful, fevers/chills, swollen glands) would recommend   We are happy to see Mr. Emel back here as needed should we find he has infectious component.       Relevant Orders   Ambulatory referral to Vascular Surgery     Janene Madeira, MSN, NP-C [place] [facility] [place] Pager: [phone] Office: [phone]  02/20/18  4:57 PM

## 2018-02-20 NOTE — Assessment & Plan Note (Addendum)
He has experienced recurrent swelling of the RLE following what appears to have been a pretty severe bout of cellulitis that involved his entire right foot spread up to the mid thigh. It was remarkably red and the peeling that occurred following treatment almost makes me wonder about the possibility of staphylococcal scalded skin syndrome reaction. I feel this is less likely directly related to the pool water as it was amenable to Ancef alone targeting mostly skin organisms; provides good reassurance that there was no atypical, mycobacterial or pseudomonal involvement. I am not certain as to the cause of his cellulitis exactly however - it is possible he had a small crack in his skin or trauma he was unaware about that allowed invasion of bacteria. Of note he did have a CABG x 4 per his record and likely required a saphenous vein harvest and I wonder if he had this to this leg which would make him more susceptible to swelling/damage.   He in the past struggled with swelling to this leg and saw Dr. Oneida Alar in 2017 with instructions to follow up PRN then. I am concerned about some damage that the infection could have caused to his lymphatic system or veins which may warrant consideration of ablation therapy or other intervention. I agree with the compression therapy and recommended a stocking donner to help get them on for 20-30 mmHg compression if possible as they struggle getting this on now.   Will refer back to vascular and vein to consider venous evaluation and intervention that may be helpful. In the mean time I  would not recommend continued antibiotics at this time. Recommended adequate moisturizer to preserve integrity of skin and prevent cuts/scrapes or cracks. He has no active lesions today and I do not see a contraindication to stay out of the pool for his exercise if he is considering re-trial of this.   Should he have recurrent evidence of infection (bright red, warm, painful, fevers/chills, swollen  glands) would recommend   We are happy to see Julian Fowler back here as needed should we find he has infectious component.

## 2018-02-20 NOTE — Assessment & Plan Note (Signed)
No signs of recurrent cellulitis today on exam. Cool to the touch and non-painful.

## 2018-02-21 ENCOUNTER — Other Ambulatory Visit: Payer: Self-pay

## 2018-02-21 DIAGNOSIS — M7989 Other specified soft tissue disorders: Secondary | ICD-10-CM

## 2018-02-21 DIAGNOSIS — R600 Localized edema: Secondary | ICD-10-CM

## 2018-03-11 ENCOUNTER — Other Ambulatory Visit: Payer: Self-pay | Admitting: Cardiovascular Disease

## 2018-03-24 DIAGNOSIS — B029 Zoster without complications: Secondary | ICD-10-CM | POA: Diagnosis not present

## 2018-03-24 DIAGNOSIS — Z6834 Body mass index (BMI) 34.0-34.9, adult: Secondary | ICD-10-CM | POA: Diagnosis not present

## 2018-04-02 ENCOUNTER — Encounter: Payer: Self-pay | Admitting: Gastroenterology

## 2018-04-02 ENCOUNTER — Ambulatory Visit (INDEPENDENT_AMBULATORY_CARE_PROVIDER_SITE_OTHER): Payer: Medicare Other | Admitting: Gastroenterology

## 2018-04-02 VITALS — BP 128/80 | HR 65 | Temp 97.0°F | Ht 69.5 in | Wt 234.0 lb

## 2018-04-02 DIAGNOSIS — K649 Unspecified hemorrhoids: Secondary | ICD-10-CM | POA: Diagnosis not present

## 2018-04-02 DIAGNOSIS — K219 Gastro-esophageal reflux disease without esophagitis: Secondary | ICD-10-CM | POA: Diagnosis not present

## 2018-04-02 NOTE — Progress Notes (Signed)
Primary Care Physician: [name], MD  Primary Gastroenterologist:  Garfield Cornea, MD   Chief Complaint  Patient presents with  . Hemorrhoids    HPI: Julian Fowler is a 83 y.o. male here for follow-up of hemorrhoids.  He has GERD as well.  He has underwent hemorrhoid banding x3.  Did very well.  Very pleased with response.  Denies any rectal bleeding, rectal pain.  Much easier to clean up after a bowel movement.  Bowel movements regular, daily with Benefiber and MiraLAX.  Avoiding straining and prolonged toilet time.  Reflux is well controlled on daily Nexium.  No abdominal pain.    Current Outpatient Medications  Medication Sig Dispense Refill  . acetaminophen (TYLENOL) 325 MG tablet Take 325 mg by mouth every 6 (six) hours as needed for pain.     Marland Kitchen albuterol (PROAIR HFA) 108 (90 Base) MCG/ACT inhaler Inhale 2 puffs into the lungs every 6 (six) hours as needed for wheezing or shortness of breath. 18 g 12  . aspirin EC 81 MG tablet Take 81 mg by mouth daily.    Marland Kitchen azelastine (ASTELIN) 0.1 % nasal spray 1-2 puffs each nostril every 8 hours if needed for drainage 30 mL 12  . Calcium Carb-Vit D-C-E-Mineral (OS-CAL ULTRA) 600 MG TABS Take 1 tablet by mouth daily.     Marland Kitchen CARTIA XT 120 MG 24 hr capsule TAKE 1 CAPSULE BY MOUTH TWICE DAILY 180 capsule 3  . esomeprazole (NEXIUM) 40 MG capsule Take 40 mg by mouth every morning.     . fexofenadine (ALLEGRA) 180 MG tablet Take 180 mg by mouth daily.    . fluticasone (FLONASE) 50 MCG/ACT nasal spray Place 2 sprays into both nostrils daily. (Patient taking differently: Place 2 sprays into both nostrils daily as needed for allergies. ) 18.2 g 12  . furosemide (LASIX) 20 MG tablet Take 1 tablet (20 mg total) by mouth as needed.    . hydrALAZINE (APRESOLINE) 25 MG tablet Take 25 mg by mouth 3 (three) times daily.    . hydrALAZINE (APRESOLINE) 25 MG tablet TAKE 1 TABLET BY MOUTH THREE TIMES DAILY 90 tablet 11  . ibuprofen (ADVIL,MOTRIN)  400 MG tablet Take 1 tablet (400 mg total) by mouth every 8 (eight) hours as needed for fever or moderate pain. 30 tablet 0  . latanoprost (XALATAN) 0.005 % ophthalmic solution Place 1 drop into both eyes at bedtime.     Marland Kitchen LORazepam (ATIVAN) 1 MG tablet TAKE 1 TABLET BY MOUTH THREE TIMES DAILY AS NEEDED 180 tablet 1  . losartan (COZAAR) 100 MG tablet Take 100 mg by mouth every morning.     . Multiple Vitamins-Minerals (CENTRUM SILVER PO) Take 1 tablet by mouth daily.     . polyethylene glycol (MIRALAX / GLYCOLAX) packet Take 17 g by mouth daily.    . potassium chloride SA (KLOR-CON M20) 20 MEQ tablet Take 1 tablet (20 mEq total) by mouth daily.    . pravastatin (PRAVACHOL) 40 MG tablet Take 1 tablet by mouth at bedtime.     . sildenafil (REVATIO) 20 MG tablet Take 1-2 tablets by mouth as needed.  1  . Wheat Dextrin (BENEFIBER) POWD Take by mouth 2 (two) times daily. Takes 1 Tbsp twice daily    . zolpidem (AMBIEN) 10 MG tablet Take 1 tablet by mouth at bedtime.     . traMADol (ULTRAM) 50 MG tablet Take 1 tablet by mouth 2 (two) times daily as needed.  No current facility-administered medications for this visit.     Allergies as of 04/02/2018 - Review Complete 04/02/2018  Allergen Reaction Noted  . Prednisone  12/17/2017  . Codeine Other (See Comments)   . Tape Other (See Comments) 04/10/2012    ROS:  General: Negative for anorexia, weight loss, fever, chills, fatigue, weakness. ENT: Negative for hoarseness, difficulty swallowing , nasal congestion. CV: Negative for chest pain, angina, palpitations, dyspnea on exertion, peripheral edema.  Respiratory: Negative for dyspnea at rest, dyspnea on exertion, cough, sputum, wheezing.  GI: See history of present illness. GU:  Negative for dysuria, hematuria, urinary incontinence, urinary frequency, nocturnal urination.  Endo: Negative for unusual weight change.    Physical Examination:   BP 128/80   Pulse 65   Temp (!) 97 F (36.1 C)  (Oral)   Ht 5' 9.5" (1.765 m)   Wt 234 lb (106.1 kg)   BMI 34.06 kg/m   General: Well-nourished, well-developed in no acute distress.  Eyes: No icterus. Mouth: Oropharyngeal mucosa moist and pink , no lesions erythema or exudate. Lungs: Clear to auscultation bilaterally.  Heart: Regular rate and rhythm, no murmurs rubs or gallops.  Abdomen: Bowel sounds are normal, nontender, nondistended, no hepatosplenomegaly or masses, no abdominal bruits or hernia , no rebound or guarding.   Extremities: No lower extremity edema. No clubbing or deformities. Neuro: Alert and oriented x 4   Skin: Warm and dry, no jaundice.   Psych: Alert and cooperative, normal mood and affect.

## 2018-04-02 NOTE — Assessment & Plan Note (Signed)
Well-controlled on Nexium.  Return to the office in 1 year or sooner if needed.

## 2018-04-02 NOTE — Assessment & Plan Note (Signed)
Doing very well status post banding X 3.  Continue current bowel regimen.  Avoid prolonged toilet time and straining.  Call with any questions or concerns.

## 2018-04-02 NOTE — Progress Notes (Signed)
CC'D TO PCP °

## 2018-04-02 NOTE — Patient Instructions (Signed)
1. Continue Miralax and Benefiber as before.  Avoid straining and prolonged sitting on the toilet. Limit toilet time to 2-3 minutes.  2. Continue Nexium as before.  3. Return to the office in one year or call sooner if needed.

## 2018-04-10 ENCOUNTER — Ambulatory Visit (HOSPITAL_COMMUNITY)
Admission: RE | Admit: 2018-04-10 | Discharge: 2018-04-10 | Disposition: A | Discharge status: Home / Self Care | Payer: Medicare Other | Source: Ambulatory Visit | Attending: Vascular Surgery | Admitting: Vascular Surgery

## 2018-04-10 ENCOUNTER — Encounter

## 2018-04-10 ENCOUNTER — Ambulatory Visit (INDEPENDENT_AMBULATORY_CARE_PROVIDER_SITE_OTHER): Payer: Medicare Other | Admitting: Vascular Surgery

## 2018-04-10 ENCOUNTER — Encounter: Payer: Self-pay | Admitting: Vascular Surgery

## 2018-04-10 VITALS — BP 154/79 | HR 57 | Temp 97.3°F | Resp 16 | Ht 70.0 in | Wt 237.9 lb

## 2018-04-10 DIAGNOSIS — M1990 Unspecified osteoarthritis, unspecified site: Secondary | ICD-10-CM | POA: Diagnosis not present

## 2018-04-10 DIAGNOSIS — I83813 Varicose veins of bilateral lower extremities with pain: Secondary | ICD-10-CM

## 2018-04-10 DIAGNOSIS — N3281 Overactive bladder: Secondary | ICD-10-CM | POA: Diagnosis not present

## 2018-04-10 DIAGNOSIS — E785 Hyperlipidemia, unspecified: Secondary | ICD-10-CM | POA: Diagnosis not present

## 2018-04-10 DIAGNOSIS — Z8249 Family history of ischemic heart disease and other diseases of the circulatory system: Secondary | ICD-10-CM | POA: Insufficient documentation

## 2018-04-10 DIAGNOSIS — Z820 Family history of epilepsy and other diseases of the nervous system: Secondary | ICD-10-CM | POA: Insufficient documentation

## 2018-04-10 DIAGNOSIS — G4730 Sleep apnea, unspecified: Secondary | ICD-10-CM | POA: Diagnosis not present

## 2018-04-10 DIAGNOSIS — Z811 Family history of alcohol abuse and dependence: Secondary | ICD-10-CM | POA: Insufficient documentation

## 2018-04-10 DIAGNOSIS — Z885 Allergy status to narcotic agent status: Secondary | ICD-10-CM | POA: Insufficient documentation

## 2018-04-10 DIAGNOSIS — Z841 Family history of disorders of kidney and ureter: Secondary | ICD-10-CM | POA: Diagnosis not present

## 2018-04-10 DIAGNOSIS — Z808 Family history of malignant neoplasm of other organs or systems: Secondary | ICD-10-CM | POA: Diagnosis not present

## 2018-04-10 DIAGNOSIS — R936 Abnormal findings on diagnostic imaging of limbs: Secondary | ICD-10-CM | POA: Insufficient documentation

## 2018-04-10 DIAGNOSIS — R600 Localized edema: Secondary | ICD-10-CM | POA: Insufficient documentation

## 2018-04-10 DIAGNOSIS — Z87891 Personal history of nicotine dependence: Secondary | ICD-10-CM | POA: Insufficient documentation

## 2018-04-10 DIAGNOSIS — Z888 Allergy status to other drugs, medicaments and biological substances status: Secondary | ICD-10-CM | POA: Insufficient documentation

## 2018-04-10 DIAGNOSIS — M7989 Other specified soft tissue disorders: Secondary | ICD-10-CM

## 2018-04-10 DIAGNOSIS — Z7982 Long term (current) use of aspirin: Secondary | ICD-10-CM | POA: Diagnosis not present

## 2018-04-10 DIAGNOSIS — H9192 Unspecified hearing loss, left ear: Secondary | ICD-10-CM | POA: Insufficient documentation

## 2018-04-10 DIAGNOSIS — J219 Acute bronchiolitis, unspecified: Secondary | ICD-10-CM | POA: Diagnosis not present

## 2018-04-10 DIAGNOSIS — I10 Essential (primary) hypertension: Secondary | ICD-10-CM | POA: Diagnosis not present

## 2018-04-10 DIAGNOSIS — Z872 Personal history of diseases of the skin and subcutaneous tissue: Secondary | ICD-10-CM | POA: Insufficient documentation

## 2018-04-10 DIAGNOSIS — Z96653 Presence of artificial knee joint, bilateral: Secondary | ICD-10-CM | POA: Diagnosis not present

## 2018-04-10 DIAGNOSIS — Z79899 Other long term (current) drug therapy: Secondary | ICD-10-CM | POA: Insufficient documentation

## 2018-04-10 DIAGNOSIS — Z803 Family history of malignant neoplasm of breast: Secondary | ICD-10-CM | POA: Insufficient documentation

## 2018-04-10 NOTE — Progress Notes (Signed)
Referring Physician: Dr Pleas Koch  Patient name: Julian Fowler MRN: [id number] DOB: 31-Jul-1935 Sex: male  REASON FOR CONSULT: Leg swelling with cellulitis  HPI: Julian Fowler is a 83 y.o. male who developed a cellulitis in his right lower extremity about 6 months ago.  This is his only prior episode of cellulitis.  The etiology is unknown but he did have a scab near his ankle that may have been an entry point.  Since that time he has had intermittent leg swelling in both legs.  His legs are good in the morning and then slowly progressively swell more as the day progresses.  They get full heavy and aching.  He has worn some compression in the past but this was fairly light 15 mmHg.  He has not had any prior ulcerations.  He denies prior history of DVT.  He has no claudication symptoms.  Other medical problems include lipidemia hypertension and sleep apnea all of which are currently stable  Past Medical History:  Diagnosis Date  . Abdominal aorta injury    Normal size, ultrasound, March 17,2011  . Allergic rhinitis   . Arthritis   . Asbestos exposure Jan 2001   Hx of asbestos related pleural plaques  . Balance problem   . BPH (benign prostatic hyperplasia)   . Complication of anesthesia    "trouble waking up"  . Easy bruisability   . ED (erectile dysfunction)   . Ejection fraction    60%, echo, 2008, mild RV dysfunction  . GERD (gastroesophageal reflux disease)   . H/O hiatal hernia   . Hearing loss in left ear   . History of shingles    "mild"  . Hyperlipidemia    under control  . Hypertension December 29, 2009   renal artery Doppler , normal  12/2009  . Overactive bladder   . Sleep apnea    was dx 10 years ago reports lost a lot of weight and no longer machine  . Tinnitus    Past Surgical History:  Procedure Laterality Date  . BACK SURGERY  2014   herniated L1, L2   Dr Carloyn Manner  . BRAIN SURGERY    . CEREBRAL EMBOLIZATION  12/2011   "radiation therapy-did not work"  .  CHOLECYSTECTOMY  6/98  . COLONOSCOPY  09/2009   Dr. Lindalou Hose: normal, internal hemorrhoids   . COLONOSCOPY N/A 02/28/2017   Dr. Gala Romney: Hemorrhoids, grade 3, mild diverticulosis.  Marland Kitchen CRANIECTOMY FOR EXCISION OF ACOUSTIC NEUROMA  3/95  . RADIOLOGY WITH ANESTHESIA N/A 07/07/2014   Procedure: EMBOLIZATION;  Surgeon: Rob Hickman, MD;  Location: [facility];  Service: Radiology;  Laterality: N/A;  . TOTAL KNEE ARTHROPLASTY Left 07/06/2013   Procedure: LEFT TOTAL KNEE ARTHROPLASTY;  Surgeon: Gearlean Alf, MD;  Location: [facility];  Service: Orthopedics;  Laterality: Left;  . TOTAL KNEE ARTHROPLASTY Right 01/18/2014   Procedure: RIGHT TOTAL KNEE ARTHROPLASTY;  Surgeon: Gearlean Alf, MD;  Location: [facility];  Service: Orthopedics;  Laterality: Right;  . TRIGGER FINGER RELEASE  2003   (thumb) middle finger (2006)    Family History  Problem Relation Age of Onset  . Cancer Father        oral cancer  . Breast cancer Mother   . Heart attack Mother   . Hypertension Sister        Bypass x4  . Alcohol abuse Sister   . Alzheimer's disease Sister   . Kidney disease Sister   . Colon cancer  Neg Hx     SOCIAL HISTORY: Social History   Socioeconomic History  . Marital status: Married    Spouse name: Not on file  . Number of children: Not on file  . Years of education: Not on file  . Highest education level: Not on file  Occupational History  . Not on file  Social Needs  . Financial resource strain: Not on file  . Food insecurity:    Worry: Not on file    Inability: Not on file  . Transportation needs:    Medical: Not on file    Non-medical: Not on file  Tobacco Use  . Smoking status: Former Smoker    Packs/day: 1.50    Years: 30.00    Pack years: 45.00    Types: Cigarettes    Start date: 04/24/1957    Last attempt to quit: 10/15/1986    Years since quitting: 31.5  . Smokeless tobacco: Never Used  Substance and Sexual Activity  . Alcohol use: No    Alcohol/week: 0.0 oz    Comment:  Used to drink heavily at times  . Drug use: No  . Sexual activity: Not on file  Lifestyle  . Physical activity:    Days per week: Not on file    Minutes per session: Not on file  . Stress: Not on file  Relationships  . Social connections:    Talks on phone: Not on file    Gets together: Not on file    Attends religious service: Not on file    Active member of club or organization: Not on file    Attends meetings of clubs or organizations: Not on file    Relationship status: Not on file  . Intimate partner violence:    Fear of current or ex partner: Not on file    Emotionally abused: Not on file    Physically abused: Not on file    Forced sexual activity: Not on file  Other Topics Concern  . Not on file  Social History Narrative   Co-dependent relationship with his 37 year old son who has drug and financial problems   Recently married to [place] on 01/02/2014    Allergies  Allergen Reactions  . Prednisone     Pt is high functioning and out of sorts.  . Codeine Other (See Comments)    REACTION: groggy  . Tape Other (See Comments)    Skin tears, use paper tape    Current Outpatient Medications  Medication Sig Dispense Refill  . acetaminophen (TYLENOL) 325 MG tablet Take 325 mg by mouth every 6 (six) hours as needed for pain.     Marland Kitchen albuterol (PROAIR HFA) 108 (90 Base) MCG/ACT inhaler Inhale 2 puffs into the lungs every 6 (six) hours as needed for wheezing or shortness of breath. 18 g 12  . aspirin EC 81 MG tablet Take 81 mg by mouth daily.    Marland Kitchen azelastine (ASTELIN) 0.1 % nasal spray 1-2 puffs each nostril every 8 hours if needed for drainage 30 mL 12  . Calcium Carb-Vit D-C-E-Mineral (OS-CAL ULTRA) 600 MG TABS Take 1 tablet by mouth daily.     Marland Kitchen CARTIA XT 120 MG 24 hr capsule TAKE 1 CAPSULE BY MOUTH TWICE DAILY 180 capsule 3  . esomeprazole (NEXIUM) 40 MG capsule Take 40 mg by mouth every morning.     . fexofenadine (ALLEGRA) 180 MG tablet Take 180 mg by mouth daily.    .  fluticasone (FLONASE) 50  MCG/ACT nasal spray Place 2 sprays into both nostrils daily. (Patient taking differently: Place 2 sprays into both nostrils daily as needed for allergies. ) 18.2 g 12  . hydrALAZINE (APRESOLINE) 25 MG tablet Take 25 mg by mouth 3 (three) times daily.    . hydrALAZINE (APRESOLINE) 25 MG tablet TAKE 1 TABLET BY MOUTH THREE TIMES DAILY 90 tablet 11  . ibuprofen (ADVIL,MOTRIN) 400 MG tablet Take 1 tablet (400 mg total) by mouth every 8 (eight) hours as needed for fever or moderate pain. 30 tablet 0  . latanoprost (XALATAN) 0.005 % ophthalmic solution Place 1 drop into both eyes at bedtime.     Marland Kitchen LORazepam (ATIVAN) 1 MG tablet TAKE 1 TABLET BY MOUTH THREE TIMES DAILY AS NEEDED 180 tablet 1  . losartan (COZAAR) 100 MG tablet Take 100 mg by mouth every morning.     . Multiple Vitamins-Minerals (CENTRUM SILVER PO) Take 1 tablet by mouth daily.     . polyethylene glycol (MIRALAX / GLYCOLAX) packet Take 17 g by mouth daily.    . potassium chloride SA (KLOR-CON M20) 20 MEQ tablet Take 1 tablet (20 mEq total) by mouth daily.    . pravastatin (PRAVACHOL) 40 MG tablet Take 1 tablet by mouth at bedtime.     . sildenafil (REVATIO) 20 MG tablet Take 1-2 tablets by mouth as needed.  1  . traMADol (ULTRAM) 50 MG tablet Take 1 tablet by mouth 2 (two) times daily as needed.    . Wheat Dextrin (BENEFIBER) POWD Take by mouth 2 (two) times daily. Takes 1 Tbsp twice daily    . zolpidem (AMBIEN) 10 MG tablet Take 1 tablet by mouth at bedtime.     . furosemide (LASIX) 20 MG tablet Take 1 tablet (20 mg total) by mouth as needed.     No current facility-administered medications for this visit.     ROS:   General:  No weight loss, Fever, chills  HEENT: No recent headaches, no nasal bleeding, no visual changes, no sore throat  Neurologic: No dizziness, blackouts, seizures. No recent symptoms of stroke or mini- stroke. No recent episodes of slurred speech, or temporary blindness.  Cardiac: No  recent episodes of chest pain/pressure, no shortness of breath at rest.  No shortness of breath with exertion.  Denies history of atrial fibrillation or irregular heartbeat  Vascular: No history of rest pain in feet.  No history of claudication.  No history of non-healing ulcer, No history of DVT   Pulmonary: No home oxygen, no productive cough, no hemoptysis,  No asthma or wheezing  Musculoskeletal:  [X]  Arthritis, [ ]  Low back pain,  [X]  Joint pain  Hematologic:No history of hypercoagulable state.  No history of easy bleeding.  No history of anemia  Gastrointestinal: No hematochezia or melena,  No gastroesophageal reflux, no trouble swallowing  Urinary: [ ]  chronic Kidney disease, [ ]  on HD - [ ]  MWF or [ ]  TTHS, [ ]  Burning with urination, [ ]  Frequent urination, [ ]  Difficulty urinating;   Skin: No rashes  Psychological: No history of anxiety,  No history of depression   Physical Examination  Vitals:   04/10/18 1511  BP: (!) 154/79  Pulse: (!) 57  Resp: 16  Temp: (!) 97.3 F (36.3 C)  TempSrc: Oral  SpO2: 96%  Weight: 237 lb 14.4 oz (107.9 kg)  Height: 5\' 10"  (1.778 m)    Body mass index is 34.14 kg/m.  General:  Alert and oriented, no acute distress  HEENT: Normal Neck: No bruit or JVD Pulmonary: Clear to auscultation bilaterally Cardiac: Regular Rate and Rhythm without murmur Abdomen: Soft, non-tender, non-distended, no mass Skin: No rash, right leg slightly more reddish skin compared to the left, some brawny discoloration bilaterally, no obvious varicosities, trace edema bilaterally Extremity Pulses:  2+ radial, brachial, femoral, dorsalis pedis, posterior tibial pulses bilaterally Musculoskeletal: No deformity  Neurologic: Upper and lower extremity motor 5/5 and symmetric  DATA:  Patient had a venous reflux exam today.  This showed reflux in the common femoral and saphenofemoral junction in the right leg however vein diameter was only 3 mm.  ASSESSMENT:  Evidence of reflux right lower extremity probably similar symptoms left lower extremity.  Patient has now had one episode of cellulitis in the right leg.  Vein diameter currently is fairly small and not really a candidate for laser ablation.   PLAN: Patient was given a prescription today for 20 to 30 mm compression stockings knee-high.  He will try to see if he gets symptomatic relief from these.  If he has recurrent episodes of cellulitis or progression of symptoms we could re-ultrasound him in the future to see if there has been any significant change in his lower extremity veins.  Patient was reassured today he is not at risk of limb loss due to normal arterial circulation.  I also reassured him that varicose veins and venous reflux does not put him at risk of increased risk of DVT.  Will follow-up with me on as-needed basis.  Ruta Hinds, MD [facility] Office: [phone] Pager: [phone]

## 2018-04-14 ENCOUNTER — Other Ambulatory Visit: Payer: Self-pay | Admitting: Vascular Surgery

## 2018-04-14 DIAGNOSIS — I83893 Varicose veins of bilateral lower extremities with other complications: Secondary | ICD-10-CM

## 2018-06-24 ENCOUNTER — Ambulatory Visit (INDEPENDENT_AMBULATORY_CARE_PROVIDER_SITE_OTHER): Payer: Medicare Other | Admitting: Internal Medicine

## 2018-06-24 ENCOUNTER — Encounter: Payer: Self-pay | Admitting: Internal Medicine

## 2018-06-24 DIAGNOSIS — F5104 Psychophysiologic insomnia: Secondary | ICD-10-CM | POA: Diagnosis not present

## 2018-06-24 DIAGNOSIS — Z23 Encounter for immunization: Secondary | ICD-10-CM | POA: Diagnosis not present

## 2018-06-24 DIAGNOSIS — J61 Pneumoconiosis due to asbestos and other mineral fibers: Secondary | ICD-10-CM

## 2018-06-24 DIAGNOSIS — J4520 Mild intermittent asthma, uncomplicated: Secondary | ICD-10-CM | POA: Diagnosis not present

## 2018-06-24 MED ORDER — LORAZEPAM 1 MG PO TABS: 1.0000 mg | ORAL_TABLET | Freq: Three times a day (TID) | ORAL | 1 refills | Status: DC | PRN

## 2018-06-24 NOTE — Progress Notes (Signed)
HPI male former smoker followed for allergic rhinitis, asthma,  history OSA, asbestos exposure/plaques, chronic insomnia Office spirometry 05/03/15- WNL- FVC 3.33/ 78%, FEV1 2.49/ 78%, r 0.75, FEF25-75% 1.92/ 69%.. -----------------------------------------------------------------------------------------------  12/17/17- 83 year old male former smoker followed for allergic rhinitis, asthma,  history OSA/ weight loss, asbestos exposure/plaques, chronic insomnia ----Asthma: Pt is having increased sinus issues-PCP is working with patient-continues to have increased drainage and would like CY to address this today. Pt otherwise is doing well. Recent hospital stay for cellulitis of right lower leg, discharged on Augmentin plus doxycycline Ultrasound x2 was negative for DVT. Main complaint is nasal congestion with postnasal drainage.  Steroid therapy for his leg "made me crazy".  Using [employer], Mucinex, Allegra, Flonase.  Some cough.  Clear mucus. CXR 10/21/17 IMPRESSION: 1. No acute findings. 2. Asbestos related pleural disease. 3.  Aortic atherosclerosis (ICD10-170.0).  06/24/2018- 83 year old male former smoker followed for allergic rhinitis, asthma,  history OSA/ weight loss, asbestos exposure/plaques, chronic insomnia -----6 month follow up for asbestosis. States his breathing has been ok since last visit.  Pro-air HFA, Flonase, Difficult several months, treated for cellulitis and still has some edema right lower leg/elastic sock.  Still swimming. Breathing has been good with rare need for rescue inhaler. Sleep pattern is primarily insomnia with difficulty initiating and maintaining sleep.  Got an adjustable bed which helps.  Wife says he is not snoring.  He dropped off of CPAP years ago after losing weight.  ROS See HPI  + = positive Constitutional:   No-   weight loss, night sweats, fevers, chills, fatigue, lassitude. HEENT:   + headaches,  No -difficulty swallowing, tooth/dental problems, sore  throat,       No-  sneezing, itching, ear ache,  +nasal congestion, +post nasal drip,  CV:  No-   chest pain, orthopnea, PND, swelling in lower extremities, no-anasarca, dizziness, palpitations Resp: No-   shortness of breath with exertion or at rest.              +  productive cough,  No non-productive cough,  No-  coughing up of blood.              No-   change in color of mucus.  No- wheezing.   Skin: No-   rash or lesions. GI:  No-   heartburn, indigestion, abdominal pain, nausea, vomiting,             GU:  MS:  + joint pain or swelling. .  + back pain. Neuro-  nothing unusual  Psych:  No- change in mood or affect. No depression or anxiety.  No memory loss.  OBJ  General- Alert, Oriented, Affect-appropriate, Distress- none acute, + overweight, +talkative  Skin- rash-none, lesions- none, excoriation- none,  Lymphadenopathy- none Head- atraumatic            Eyes- Gross vision intact, PERRLA, conjunctivae clear secretions            Ears- +hard of hearing/hearing aid            Nose- + turbinate edema, no-Septal dev, mucus, polyps, erosion, perforation             Throat- Mallampati II , mucosa clear , drainage- none, tonsils- atrophic. Dental repair. Neck- flexible , trachea midline, no stridor , thyroid nl, carotid no bruit Chest - symmetrical excursion , unlabored           Heart/CV- slow RRR , no murmur , no gallop  , no rub,  nl s1 s2                           - JVD- none , edema + chronic right lower leg, varices- none , dullness-none, rub- none. +I cannot hear fine crackles or rub.           Chest wall-  Abd-  Br/ Gen/ Rectal- Not done, not indicated Extrem- +edema and erythema right calf/ankle/ sockline Neuro- grossly intact to observation. Speech is clear.

## 2018-06-24 NOTE — Patient Instructions (Signed)
Script printed refilling lorazepam  Order- Pneumovax-23 pneumonia   Order- Flu vax senior  Please call as needed

## 2018-06-26 DIAGNOSIS — H401131 Primary open-angle glaucoma, bilateral, mild stage: Secondary | ICD-10-CM | POA: Diagnosis not present

## 2018-07-17 DIAGNOSIS — M25461 Effusion, right knee: Secondary | ICD-10-CM | POA: Diagnosis not present

## 2018-07-17 DIAGNOSIS — M25571 Pain in right ankle and joints of right foot: Secondary | ICD-10-CM | POA: Diagnosis not present

## 2018-07-28 NOTE — Assessment & Plan Note (Signed)
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  I do not hear crackles. Plan-continue long-term surveillance.

## 2018-07-28 NOTE — Assessment & Plan Note (Signed)
Very well controlled with rare need for rescue inhaler and no sleep disturbance. Plan-Pneumovax, flu vaccine

## 2018-07-28 NOTE — Assessment & Plan Note (Signed)
We reviewed basic sleep hygiene. Plan-refill lorazepam for sleep

## 2018-08-01 DIAGNOSIS — Z6836 Body mass index (BMI) 36.0-36.9, adult: Secondary | ICD-10-CM | POA: Diagnosis not present

## 2018-08-01 DIAGNOSIS — R220 Localized swelling, mass and lump, head: Secondary | ICD-10-CM | POA: Diagnosis not present

## 2018-08-20 DIAGNOSIS — M7989 Other specified soft tissue disorders: Secondary | ICD-10-CM | POA: Diagnosis not present

## 2018-08-20 DIAGNOSIS — R2689 Other abnormalities of gait and mobility: Secondary | ICD-10-CM | POA: Diagnosis not present

## 2018-08-20 DIAGNOSIS — M76821 Posterior tibial tendinitis, right leg: Secondary | ICD-10-CM | POA: Diagnosis not present

## 2018-09-08 DIAGNOSIS — M76821 Posterior tibial tendinitis, right leg: Secondary | ICD-10-CM | POA: Diagnosis not present

## 2018-09-15 DIAGNOSIS — M76821 Posterior tibial tendinitis, right leg: Secondary | ICD-10-CM | POA: Diagnosis not present

## 2018-09-18 DIAGNOSIS — J209 Acute bronchitis, unspecified: Secondary | ICD-10-CM | POA: Diagnosis not present

## 2018-09-18 DIAGNOSIS — Z6836 Body mass index (BMI) 36.0-36.9, adult: Secondary | ICD-10-CM | POA: Diagnosis not present

## 2018-09-18 DIAGNOSIS — M76821 Posterior tibial tendinitis, right leg: Secondary | ICD-10-CM | POA: Diagnosis not present

## 2018-09-18 DIAGNOSIS — J61 Pneumoconiosis due to asbestos and other mineral fibers: Secondary | ICD-10-CM | POA: Diagnosis not present

## 2018-09-18 DIAGNOSIS — J4520 Mild intermittent asthma, uncomplicated: Secondary | ICD-10-CM | POA: Diagnosis not present

## 2018-09-23 DIAGNOSIS — J61 Pneumoconiosis due to asbestos and other mineral fibers: Secondary | ICD-10-CM | POA: Diagnosis not present

## 2018-09-23 DIAGNOSIS — J209 Acute bronchitis, unspecified: Secondary | ICD-10-CM | POA: Diagnosis not present

## 2018-09-23 DIAGNOSIS — J4520 Mild intermittent asthma, uncomplicated: Secondary | ICD-10-CM | POA: Diagnosis not present

## 2018-09-23 DIAGNOSIS — Z6835 Body mass index (BMI) 35.0-35.9, adult: Secondary | ICD-10-CM | POA: Diagnosis not present

## 2018-09-29 DIAGNOSIS — M76821 Posterior tibial tendinitis, right leg: Secondary | ICD-10-CM | POA: Diagnosis not present

## 2018-10-02 DIAGNOSIS — B351 Tinea unguium: Secondary | ICD-10-CM | POA: Diagnosis not present

## 2018-10-02 DIAGNOSIS — M79672 Pain in left foot: Secondary | ICD-10-CM | POA: Diagnosis not present

## 2018-10-02 DIAGNOSIS — M79674 Pain in right toe(s): Secondary | ICD-10-CM | POA: Diagnosis not present

## 2018-10-02 DIAGNOSIS — M79675 Pain in left toe(s): Secondary | ICD-10-CM | POA: Diagnosis not present

## 2018-10-02 DIAGNOSIS — M76821 Posterior tibial tendinitis, right leg: Secondary | ICD-10-CM | POA: Diagnosis not present

## 2018-10-02 DIAGNOSIS — M79671 Pain in right foot: Secondary | ICD-10-CM | POA: Diagnosis not present

## 2018-10-03 ENCOUNTER — Encounter: Payer: Self-pay | Admitting: Nurse Practitioner

## 2018-10-03 ENCOUNTER — Ambulatory Visit (INDEPENDENT_AMBULATORY_CARE_PROVIDER_SITE_OTHER)
Admission: RE | Admit: 2018-10-03 | Discharge: 2018-10-03 | Disposition: A | Discharge status: Home / Self Care | Payer: Medicare Other | Source: Ambulatory Visit | Attending: Nurse Practitioner | Admitting: Nurse Practitioner

## 2018-10-03 ENCOUNTER — Ambulatory Visit (INDEPENDENT_AMBULATORY_CARE_PROVIDER_SITE_OTHER): Payer: Medicare Other | Admitting: Nurse Practitioner

## 2018-10-03 VITALS — BP 130/76 | HR 62 | Ht 70.0 in | Wt 232.0 lb

## 2018-10-03 DIAGNOSIS — J40 Bronchitis, not specified as acute or chronic: Secondary | ICD-10-CM

## 2018-10-03 DIAGNOSIS — R059 Cough, unspecified: Secondary | ICD-10-CM

## 2018-10-03 DIAGNOSIS — R05 Cough: Secondary | ICD-10-CM

## 2018-10-03 DIAGNOSIS — J019 Acute sinusitis, unspecified: Secondary | ICD-10-CM

## 2018-10-03 MED ORDER — DOXYCYCLINE HYCLATE 100 MG PO TABS: 100.0000 mg | ORAL_TABLET | Freq: Two times a day (BID) | ORAL | 0 refills | Status: DC

## 2018-10-03 MED ORDER — LEVALBUTEROL HCL 0.63 MG/3ML IN NEBU
0.6300 mg | INHALATION_SOLUTION | Freq: Once | RESPIRATORY_TRACT | Status: AC
  Administered 2018-10-03: 0.63 mg via RESPIRATORY_TRACT

## 2018-10-03 MED ORDER — METHYLPREDNISOLONE ACETATE 80 MG/ML IJ SUSP
80.0000 mg | Freq: Once | INTRAMUSCULAR | Status: AC
  Administered 2018-10-03: 80 mg via INTRAMUSCULAR

## 2018-10-03 NOTE — Assessment & Plan Note (Signed)
Patient Instructions  Will order chest x ray and call with results Continue flonase Continue mucinex Continue all other medications DepoMedrol given in office today xopenex neb given in office today  Please follow up with Dr. Annamaria Boots at his 1st available appointment

## 2018-10-03 NOTE — Progress Notes (Signed)
@Patient  ID: Julian Fowler, male    DOB: December 19, 1934, 83 y.o.   MRN: [id number]  Chief Complaint  Patient presents with  . Cough    with congestion    Referring provider: [name], MD  HPI 83 year old male former smoker followed for allergic rhinitis, asthma, history of OSA, asbestos exposure/plaques, chronic insomnia.  He is followed by Dr. Annamaria Boots.  Tests:  CXR 10/21/17 - No acute findings. Asbestos related pleural disease.  Office spirometry 05/03/15- WNL- FVC 3.33/ 78%, FEV1 2.49/ 78%, r 0.75, FEF25-75% 1.92/ 69%.  OV 10/03/18 - cute cough and chest congestion Patient presents today with cough and chest congestion.  His cough is productive of yellow sputum.  He states that symptoms started over 3 weeks ago.  He has been seen by his PCP.  He was prescribed Levaquin and has had 2 Depo-Medrol shots.  He states his symptoms persist.  He also complains of sinus congestion pressure and pain along with postnasal drip.  His PCP did a chest x-ray on 09/23/2018 which was clear other than chronic changes from asbestos exposure.  No recent fever.  He denies any shortness of breath, chest pain, or edema.  Compliant with medications.   Allergies  Allergen Reactions  . Prednisone     Pt is high functioning and out of sorts.  . Codeine Other (See Comments)    REACTION: groggy  . Tape Other (See Comments)    Skin tears, use paper tape    Immunization History  Administered Date(s) Administered  . Influenza Split 07/15/2011, 06/15/2012, 06/29/2013, 06/15/2014, 07/15/2016  . Influenza Whole 08/04/2007, 06/22/2009, 07/19/2009, 06/15/2010  . Influenza, High Dose Seasonal PF 07/11/2017, 06/24/2018  . Pneumococcal Conjugate-13 05/03/2015  . Pneumococcal Polysaccharide-23 07/29/2008, 06/24/2018  . Td 12/13/2004  . Zoster 07/22/2006    Past Medical History:  Diagnosis Date  . Abdominal aorta injury    Normal size, ultrasound, March 17,2011  . Allergic rhinitis   . Arthritis   .  Asbestos exposure Jan 2001   Hx of asbestos related pleural plaques  . Balance problem   . BPH (benign prostatic hyperplasia)   . Complication of anesthesia    "trouble waking up"  . Easy bruisability   . ED (erectile dysfunction)   . Ejection fraction    60%, echo, 2008, mild RV dysfunction  . GERD (gastroesophageal reflux disease)   . H/O hiatal hernia   . Hearing loss in left ear   . History of shingles    "mild"  . Hyperlipidemia    under control  . Hypertension December 29, 2009   renal artery Doppler , normal  12/2009  . Overactive bladder   . Sleep apnea    was dx 10 years ago reports lost a lot of weight and no longer machine  . Tinnitus     Tobacco History: Social History   Tobacco Use  Smoking Status Former Smoker  . Packs/day: 1.50  . Years: 30.00  . Pack years: 45.00  . Types: Cigarettes  . Start date: 04/24/1957  . Last attempt to quit: 10/15/1986  . Years since quitting: 31.9  Smokeless Tobacco Never Used   Counseling given: Not Answered   Outpatient Encounter Medications as of 10/03/2018  Medication Sig  . acetaminophen (TYLENOL) 325 MG tablet Take 325 mg by mouth every 6 (six) hours as needed for pain.   Marland Kitchen albuterol (PROAIR HFA) 108 (90 Base) MCG/ACT inhaler Inhale 2 puffs into the lungs every 6 (six) hours as  needed for wheezing or shortness of breath.  Marland Kitchen aspirin EC 81 MG tablet Take 81 mg by mouth daily.  Marland Kitchen azelastine (ASTELIN) 0.1 % nasal spray 1-2 puffs each nostril every 8 hours if needed for drainage  . Calcium Carb-Vit D-C-E-Mineral (OS-CAL ULTRA) 600 MG TABS Take 1 tablet by mouth daily.   Marland Kitchen CARTIA XT 120 MG 24 hr capsule TAKE 1 CAPSULE BY MOUTH TWICE DAILY  . esomeprazole (NEXIUM) 40 MG capsule Take 40 mg by mouth every morning.   . fexofenadine (ALLEGRA) 180 MG tablet Take 180 mg by mouth daily.  . fluticasone (FLONASE) 50 MCG/ACT nasal spray Place 2 sprays into both nostrils daily. (Patient taking differently: Place 2 sprays into both nostrils  daily as needed for allergies. )  . hydrALAZINE (APRESOLINE) 25 MG tablet Take 25 mg by mouth 3 (three) times daily.  Marland Kitchen latanoprost (XALATAN) 0.005 % ophthalmic solution Place 1 drop into both eyes at bedtime.   Marland Kitchen latanoprost (XALATAN) 0.005 % ophthalmic solution latanoprost 0.005 % eye drops  INSTILL 1 DROP INTO EACH EYE AT BEDTIME  . LORazepam (ATIVAN) 1 MG tablet Take 1 tablet (1 mg total) by mouth 3 (three) times daily as needed.  Marland Kitchen losartan (COZAAR) 100 MG tablet Take 100 mg by mouth every morning.   . Multiple Vitamins-Minerals (CENTRUM SILVER PO) Take 1 tablet by mouth daily.   . polyethylene glycol (MIRALAX / GLYCOLAX) packet Take 17 g by mouth daily.  . potassium chloride SA (KLOR-CON M20) 20 MEQ tablet Take 1 tablet (20 mEq total) by mouth daily.  . pravastatin (PRAVACHOL) 40 MG tablet Take 1 tablet by mouth at bedtime.   . sildenafil (REVATIO) 20 MG tablet Take 1-2 tablets by mouth as needed.  . traMADol (ULTRAM) 50 MG tablet Take 1 tablet by mouth 2 (two) times daily as needed.  . Wheat Dextrin (BENEFIBER) POWD Take by mouth 2 (two) times daily. Takes 1 Tbsp twice daily  . zolpidem (AMBIEN) 10 MG tablet Take 1 tablet by mouth at bedtime.   Marland Kitchen doxycycline (VIBRA-TABS) 100 MG tablet Take 1 tablet (100 mg total) by mouth 2 (two) times daily.  . furosemide (LASIX) 20 MG tablet Take 1 tablet (20 mg total) by mouth as needed.  . [EXPIRED] levalbuterol (XOPENEX) nebulizer solution 0.63 mg   . [EXPIRED] methylPREDNISolone acetate (DEPO-MEDROL) injection 80 mg    No facility-administered encounter medications on file as of 10/03/2018.      Review of Systems  Review of Systems  Constitutional: Negative for chills and fever.  HENT: Positive for postnasal drip, sinus pressure and sinus pain.   Respiratory: Positive for cough, shortness of breath and wheezing.   Cardiovascular: Negative.  Negative for chest pain, palpitations and leg swelling.  Gastrointestinal: Negative.     Allergic/Immunologic: Negative.   Neurological: Negative.   Psychiatric/Behavioral: Negative.        Physical Exam  BP 130/76 (BP Location: Right Arm, Patient Position: Sitting, Cuff Size: Normal)   Pulse 62   Ht 5\' 10"  (1.778 m)   Wt 232 lb (105.2 kg)   SpO2 94%   BMI 33.29 kg/m   Wt Readings from Last 5 Encounters:  10/03/18 232 lb (105.2 kg)  06/24/18 237 lb 9.6 oz (107.8 kg)  04/10/18 237 lb 14.4 oz (107.9 kg)  04/02/18 234 lb (106.1 kg)  02/19/18 241 lb (109.3 kg)     Physical Exam Vitals signs and nursing note reviewed.  Constitutional:      General: He is  not in acute distress.    Appearance: He is well-developed.  HENT:     Nose:     Right Sinus: Maxillary sinus tenderness and frontal sinus tenderness present.     Left Sinus: Maxillary sinus tenderness and frontal sinus tenderness present.  Cardiovascular:     Rate and Rhythm: Normal rate and regular rhythm.  Pulmonary:     Effort: Pulmonary effort is normal. No respiratory distress.     Breath sounds: Rhonchi present.  Musculoskeletal:        General: No swelling.  Skin:    General: Skin is warm and dry.  Neurological:     Mental Status: He is alert and oriented to person, place, and time.       Assessment & Plan:   Bronchitis Patient Instructions  Will order chest x ray and call with results Continue flonase Continue mucinex Continue all other medications DepoMedrol given in office today xopenex neb given in office today  Please follow up with Dr. Annamaria Boots at his 1st available appointment    Acute sinusitis Patient Instructions  Will order chest x ray and call with results Continue flonase Continue mucinex Continue all other medications DepoMedrol given in office today xopenex neb given in office today  Please follow up with Dr. Annamaria Boots at his 1st available appointment       Fenton Foy, NP 10/03/2018

## 2018-10-03 NOTE — Patient Instructions (Addendum)
Will order chest x ray and call with results Continue flonase Continue mucinex Continue all other medications DepoMedrol given in office today xopenex neb given in office today  Please follow up with Dr. Annamaria Boots at his 1st available appointment

## 2018-10-06 DIAGNOSIS — M76821 Posterior tibial tendinitis, right leg: Secondary | ICD-10-CM | POA: Diagnosis not present

## 2018-10-09 DIAGNOSIS — M76821 Posterior tibial tendinitis, right leg: Secondary | ICD-10-CM | POA: Diagnosis not present

## 2018-10-13 DIAGNOSIS — M76821 Posterior tibial tendinitis, right leg: Secondary | ICD-10-CM | POA: Diagnosis not present

## 2018-10-16 DIAGNOSIS — M76821 Posterior tibial tendinitis, right leg: Secondary | ICD-10-CM | POA: Diagnosis not present

## 2018-10-20 DIAGNOSIS — M76821 Posterior tibial tendinitis, right leg: Secondary | ICD-10-CM | POA: Diagnosis not present

## 2018-10-23 DIAGNOSIS — M76821 Posterior tibial tendinitis, right leg: Secondary | ICD-10-CM | POA: Diagnosis not present

## 2018-10-27 DIAGNOSIS — M76821 Posterior tibial tendinitis, right leg: Secondary | ICD-10-CM | POA: Diagnosis not present

## 2018-10-30 DIAGNOSIS — M76821 Posterior tibial tendinitis, right leg: Secondary | ICD-10-CM | POA: Diagnosis not present

## 2018-11-02 ENCOUNTER — Emergency Department (HOSPITAL_COMMUNITY): Payer: Medicare Other

## 2018-11-02 ENCOUNTER — Emergency Department (HOSPITAL_COMMUNITY)
Admission: EM | Admit: 2018-11-02 | Discharge: 2018-11-02 | Disposition: A | Discharge status: Home / Self Care | Payer: Medicare Other | Attending: Emergency Medicine | Admitting: Emergency Medicine

## 2018-11-02 ENCOUNTER — Encounter (HOSPITAL_COMMUNITY): Payer: Self-pay | Admitting: Emergency Medicine

## 2018-11-02 DIAGNOSIS — I10 Essential (primary) hypertension: Secondary | ICD-10-CM | POA: Diagnosis not present

## 2018-11-02 DIAGNOSIS — Y929 Unspecified place or not applicable: Secondary | ICD-10-CM | POA: Diagnosis not present

## 2018-11-02 DIAGNOSIS — Z79899 Other long term (current) drug therapy: Secondary | ICD-10-CM | POA: Insufficient documentation

## 2018-11-02 DIAGNOSIS — R55 Syncope and collapse: Secondary | ICD-10-CM

## 2018-11-02 DIAGNOSIS — Y999 Unspecified external cause status: Secondary | ICD-10-CM | POA: Diagnosis not present

## 2018-11-02 DIAGNOSIS — Z87891 Personal history of nicotine dependence: Secondary | ICD-10-CM | POA: Diagnosis not present

## 2018-11-02 DIAGNOSIS — S299XXA Unspecified injury of thorax, initial encounter: Secondary | ICD-10-CM | POA: Diagnosis not present

## 2018-11-02 DIAGNOSIS — R109 Unspecified abdominal pain: Secondary | ICD-10-CM | POA: Diagnosis not present

## 2018-11-02 DIAGNOSIS — M25511 Pain in right shoulder: Secondary | ICD-10-CM | POA: Diagnosis not present

## 2018-11-02 DIAGNOSIS — W109XXA Fall (on) (from) unspecified stairs and steps, initial encounter: Secondary | ICD-10-CM | POA: Insufficient documentation

## 2018-11-02 DIAGNOSIS — E871 Hypo-osmolality and hyponatremia: Secondary | ICD-10-CM | POA: Insufficient documentation

## 2018-11-02 DIAGNOSIS — S0990XA Unspecified injury of head, initial encounter: Secondary | ICD-10-CM | POA: Diagnosis not present

## 2018-11-02 DIAGNOSIS — W19XXXA Unspecified fall, initial encounter: Secondary | ICD-10-CM

## 2018-11-02 DIAGNOSIS — J45909 Unspecified asthma, uncomplicated: Secondary | ICD-10-CM | POA: Diagnosis not present

## 2018-11-02 DIAGNOSIS — Y939 Activity, unspecified: Secondary | ICD-10-CM | POA: Insufficient documentation

## 2018-11-02 DIAGNOSIS — S40011A Contusion of right shoulder, initial encounter: Secondary | ICD-10-CM | POA: Insufficient documentation

## 2018-11-02 DIAGNOSIS — S199XXA Unspecified injury of neck, initial encounter: Secondary | ICD-10-CM | POA: Diagnosis not present

## 2018-11-02 DIAGNOSIS — S3991XA Unspecified injury of abdomen, initial encounter: Secondary | ICD-10-CM | POA: Diagnosis not present

## 2018-11-02 DIAGNOSIS — S4991XA Unspecified injury of right shoulder and upper arm, initial encounter: Secondary | ICD-10-CM | POA: Diagnosis not present

## 2018-11-02 LAB — COMPREHENSIVE METABOLIC PANEL
ALT: 25 U/L (ref 0–44)
AST: 25 U/L (ref 15–41)
Albumin: 3.2 g/dL — ABNORMAL LOW (ref 3.5–5.0)
Alkaline Phosphatase: 60 U/L (ref 38–126)
Anion gap: 11 (ref 5–15)
BUN: 15 mg/dL (ref 8–23)
CO2: 23 mmol/L (ref 22–32)
Calcium: 8.6 mg/dL — ABNORMAL LOW (ref 8.9–10.3)
Chloride: 94 mmol/L — ABNORMAL LOW (ref 98–111)
Creatinine, Ser: 0.84 mg/dL (ref 0.61–1.24)
GFR calc non Af Amer: >60 mL/min (ref >60)
Glucose, Bld: 100 mg/dL — ABNORMAL HIGH (ref 70–99)
Potassium: 3.7 mmol/L (ref 3.5–5.1)
SODIUM: 128 mmol/L — AB (ref 135–145)
Total Bilirubin: 0.6 mg/dL (ref 0.3–1.2)
Total Protein: 6 g/dL — ABNORMAL LOW (ref 6.5–8.1)

## 2018-11-02 LAB — CBC WITH DIFFERENTIAL/PLATELET
Abs Immature Granulocytes: 0.08 10*3/uL — ABNORMAL HIGH (ref 0.00–0.07)
Basophils Absolute: 0 10*3/uL (ref 0.0–0.1)
Basophils Relative: 0 %
EOS ABS: 0.1 10*3/uL (ref 0.0–0.5)
Eosinophils Relative: 1 %
HCT: 39.5 % (ref 39.0–52.0)
Hemoglobin: 13 g/dL (ref 13.0–17.0)
Immature Granulocytes: 1 %
LYMPHS ABS: 1 10*3/uL (ref 0.7–4.0)
Lymphocytes Relative: 12 %
MCH: 29.2 pg (ref 26.0–34.0)
MCHC: 32.9 g/dL (ref 30.0–36.0)
MCV: 88.8 fL (ref 80.0–100.0)
Monocytes Absolute: 1.1 10*3/uL — ABNORMAL HIGH (ref 0.1–1.0)
Monocytes Relative: 13 %
Neutro Abs: 6.2 10*3/uL (ref 1.7–7.7)
Neutrophils Relative %: 73 %
Platelets: 276 10*3/uL (ref 150–400)
RBC: 4.45 MIL/uL (ref 4.22–5.81)
RDW: 15.4 % (ref 11.5–15.5)
WBC: 8.5 10*3/uL (ref 4.0–10.5)
nRBC: 0 % (ref 0.0–0.2)

## 2018-11-02 LAB — I-STAT CHEM 8, ED
BUN: 16 mg/dL (ref 8–23)
Calcium, Ion: 1.13 mmol/L — ABNORMAL LOW (ref 1.15–1.40)
Chloride: 94 mmol/L — ABNORMAL LOW (ref 98–111)
Creatinine, Ser: 0.8 mg/dL (ref 0.61–1.24)
GLUCOSE: 98 mg/dL (ref 70–99)
HCT: 40 % (ref 39.0–52.0)
Hemoglobin: 13.6 g/dL (ref 13.0–17.0)
Potassium: 3.7 mmol/L (ref 3.5–5.1)
Sodium: 129 mmol/L — ABNORMAL LOW (ref 135–145)
TCO2: 25 mmol/L (ref 22–32)

## 2018-11-02 LAB — URINALYSIS, ROUTINE W REFLEX MICROSCOPIC
Bacteria, UA: NONE SEEN
Bilirubin Urine: NEGATIVE
Glucose, UA: NEGATIVE mg/dL
Hgb urine dipstick: NEGATIVE
Ketones, ur: NEGATIVE mg/dL
LEUKOCYTES UA: NEGATIVE
NITRITE: NEGATIVE
Protein, ur: 100 mg/dL — AB
Specific Gravity, Urine: 1.02 (ref 1.005–1.030)
pH: 7 (ref 5.0–8.0)

## 2018-11-02 LAB — I-STAT TROPONIN, ED
Troponin i, poc: 0.01 ng/mL (ref 0.00–0.08)
Troponin i, poc: 0.01 ng/mL (ref 0.00–0.08)

## 2018-11-02 MED ORDER — ACETAMINOPHEN 325 MG PO TABS
650.0000 mg | ORAL_TABLET | Freq: Once | ORAL | Status: AC
  Administered 2018-11-02: 650 mg via ORAL
  Filled 2018-11-02: qty 2

## 2018-11-02 MED ORDER — IOHEXOL 300 MG/ML  SOLN
100.0000 mL | Freq: Once | INTRAMUSCULAR | Status: AC | PRN
  Administered 2018-11-02: 100 mL via INTRAVENOUS

## 2018-11-02 NOTE — ED Notes (Signed)
Ambulated pt, no assistance. Pt had steady gait, walked him to the bathroom. Pt wants to put on pants, let him do that.

## 2018-11-02 NOTE — ED Triage Notes (Addendum)
Pt reports falling down 13 carpet covered steps last night @ 1700. Pt denies LOC.  Pt denies blood thinners. Significant bruising and decreased ROM to R shoulder.  Abrasion to L back, L knee pain last night.  Pt states this am when he was attempting to get dressed pt became diaphoretic, clammy and pre syncopal, episode lasted 20 min.

## 2018-11-02 NOTE — ED Notes (Signed)
Patient transported to CT 

## 2018-11-02 NOTE — ED Provider Notes (Signed)
[facility] EMERGENCY DEPARTMENT Provider Note   CSN: [id number] Arrival date & time: 11/02/18  2703     History   Chief Complaint Chief Complaint  Patient presents with  . Fall    HPI Julian Fowler is a 84 y.o. male.  The history is provided by the patient, the spouse and medical records. No language interpreter was used.  Fall    Julian Fowler is a 84 y.o. male who presents to the Emergency Department complaining of fall. He presents to the emergency department for evaluation of injuries following a fall last night. He was walking up the basement stairs with a bottle of water. He was at the top of the stairs and dropped the bottle and then stepped on it and fell backwards down 13 stairs. He flipped over his head and hit his right shoulder. He is unsure if he hit his head. He was able to get himself back up and took a shower and went to bed. When he awoke this morning he developed profuse diaphoresis and felt like he might pass out. Outside of pain to his right shoulder and left low back he has no other additional symptoms. He does not take any blood thinners. He denies any chest pain, shortness of breath, abdominal pain. Past Medical History:  Diagnosis Date  . Abdominal aorta injury    Normal size, ultrasound, March 17,2011  . Allergic rhinitis   . Arthritis   . Asbestos exposure Jan 2001   Hx of asbestos related pleural plaques  . Balance problem   . BPH (benign prostatic hyperplasia)   . Complication of anesthesia    "trouble waking up"  . Easy bruisability   . ED (erectile dysfunction)   . Ejection fraction    60%, echo, 2008, mild RV dysfunction  . GERD (gastroesophageal reflux disease)   . H/O hiatal hernia   . Hearing loss in left ear   . History of shingles    "mild"  . Hyperlipidemia    under control  . Hypertension December 29, 2009   renal artery Doppler , normal  12/2009  . Overactive bladder   . Sleep apnea    was dx 10 years ago  reports lost a lot of weight and no longer machine  . Tinnitus     Patient Active Problem List   Diagnosis Date Noted  . Bronchitis 10/03/2018  . Acute sinusitis 10/03/2018  . Localized swelling of lower extremity 02/19/2018  . Asbestosis ([place]) 08/14/2017  . Rectal bleeding 01/24/2017  . Hemorrhoids 12/18/2016  . BRBPR (bright red blood per rectum) 12/18/2016  . 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 Peripheral edema 10/15/2013  . Hyponatremia 07/07/2013  . Hypokalemia 07/07/2013  . OA (osteoarthritis) of knee 07/06/2013  . Preop cardiovascular exam 06/26/2013  . Chronic insomnia 09/29/2012  . Cardiac murmur 08/27/2012  . Hearing impaired 04/10/2012  . Easy bruisability   . Anxiety   . Hyperlipidemia   . Abdominal aorta injury   . Asbestos exposure   . Overactive bladder   . ED (erectile dysfunction)   . SVT (supraventricular tachycardia) ([place])   . OSA (obstructive sleep apnea)   . Chest tightness   . Ejection fraction   . Essential hypertension 12/29/2009  . Syncope 09/14/2009  . TOBACCO ABUSE, HX OF 07/15/2009  . BACK PAIN 06/22/2009  . INTESTINAL GAS 06/22/2009  . 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 Seasonal and perennial allergic rhinitis 01/15/2008  .  ECZEMA 11/18/2007  . Asthma, mild intermittent, well-controlled 11/03/2007  . GERD 04/14/2007  . ARTHRITIS 04/14/2007  . KNEE PAIN, LEFT 04/14/2007    Past Surgical History:  Procedure Laterality Date  . BACK SURGERY  2014   herniated L1, L2   Dr Carloyn Manner  . BRAIN SURGERY    . CEREBRAL EMBOLIZATION  12/2011   "radiation therapy-did not work"  . CHOLECYSTECTOMY  6/98  . COLONOSCOPY  09/2009   Dr. Lindalou Hose: normal, internal hemorrhoids   . COLONOSCOPY N/A 02/28/2017   Dr. Gala Romney: Hemorrhoids, grade 3, mild diverticulosis.  Marland Kitchen CRANIECTOMY FOR EXCISION OF ACOUSTIC NEUROMA  3/95  . RADIOLOGY WITH ANESTHESIA N/A 07/07/2014   Procedure: EMBOLIZATION;  Surgeon: Rob Hickman, MD;   Location: [facility];  Service: Radiology;  Laterality: N/A;  . TOTAL KNEE ARTHROPLASTY Left 07/06/2013   Procedure: LEFT TOTAL KNEE ARTHROPLASTY;  Surgeon: Gearlean Alf, MD;  Location: [facility];  Service: Orthopedics;  Laterality: Left;  . TOTAL KNEE ARTHROPLASTY Right 01/18/2014   Procedure: RIGHT TOTAL KNEE ARTHROPLASTY;  Surgeon: Gearlean Alf, MD;  Location: [facility];  Service: Orthopedics;  Laterality: Right;  . TRIGGER FINGER RELEASE  2003   (thumb) middle finger (2006)        Home Medications    Prior to Admission medications   Medication Sig Start Date End Date Taking? Authorizing Provider  acetaminophen (TYLENOL) 325 MG tablet Take 325 mg by mouth every 6 (six) hours as needed for pain.     [provider]  albuterol (PROAIR HFA) 108 (90 Base) MCG/ACT inhaler Inhale 2 puffs into the lungs every 6 (six) hours as needed for wheezing or shortness of breath. 12/17/17   Baird Lyons D, MD  aspirin EC 81 MG tablet Take 81 mg by mouth daily.    [provider]  azelastine (ASTELIN) 0.1 % nasal spray 1-2 puffs each nostril every 8 hours if needed for drainage 12/17/17   Baird Lyons D, MD  Calcium Carb-Vit D-C-E-Mineral (OS-CAL ULTRA) 600 MG TABS Take 1 tablet by mouth daily.     [provider]  CARTIA XT 120 MG 24 hr capsule TAKE 1 CAPSULE BY MOUTH TWICE DAILY 03/12/18   Herminio Commons, MD  doxycycline (VIBRA-TABS) 100 MG tablet Take 1 tablet (100 mg total) by mouth 2 (two) times daily. 10/03/18   Fenton Foy, NP  esomeprazole (NEXIUM) 40 MG capsule Take 40 mg by mouth every morning.     [provider]  fexofenadine (ALLEGRA) 180 MG tablet Take 180 mg by mouth daily.    [provider]  fluticasone (FLONASE) 50 MCG/ACT nasal spray Place 2 sprays into both nostrils daily. Patient taking differently: Place 2 sprays into both nostrils daily as needed for allergies.  05/03/16   Baird Lyons D, MD  furosemide (LASIX) 20 MG tablet Take 1  tablet (20 mg total) by mouth as needed. 12/27/17 04/02/18  Herminio Commons, MD  hydrALAZINE (APRESOLINE) 25 MG tablet Take 25 mg by mouth 3 (three) times daily.    [provider]  latanoprost (XALATAN) 0.005 % ophthalmic solution Place 1 drop into both eyes at bedtime.  09/01/12   [provider]  latanoprost (XALATAN) 0.005 % ophthalmic solution latanoprost 0.005 % eye drops  INSTILL 1 DROP INTO EACH EYE AT BEDTIME    [provider]  LORazepam (ATIVAN) 1 MG tablet Take 1 tablet (1 mg total) by mouth 3 (three) times daily as needed. 06/24/18   Young,  Kasandra Knudsen, MD  losartan (COZAAR) 100 MG tablet Take 100 mg by mouth every morning.     [provider]  Multiple Vitamins-Minerals (CENTRUM SILVER PO) Take 1 tablet by mouth daily.     [provider]  polyethylene glycol (MIRALAX / GLYCOLAX) packet Take 17 g by mouth daily.    [provider]  potassium chloride SA (KLOR-CON M20) 20 MEQ tablet Take 1 tablet (20 mEq total) by mouth daily. 12/27/17   Herminio Commons, MD  pravastatin (PRAVACHOL) 40 MG tablet Take 1 tablet by mouth at bedtime.  10/05/14   [provider]  sildenafil (REVATIO) 20 MG tablet Take 1-2 tablets by mouth as needed. 11/23/17   [provider]  traMADol (ULTRAM) 50 MG tablet Take 1 tablet by mouth 2 (two) times daily as needed. 11/20/17   [provider]  Wheat Dextrin (BENEFIBER) POWD Take by mouth 2 (two) times daily. Takes 1 Tbsp twice daily    [provider]  zolpidem (AMBIEN) 10 MG tablet Take 1 tablet by mouth at bedtime.  01/24/16   [provider]    Family History Family History  Problem Relation Age of Onset  . Cancer Father        oral cancer  . Breast cancer Mother   . Heart attack Mother   . Hypertension Sister        Bypass x4  . Alcohol abuse Sister   . Alzheimer's disease Sister   . Kidney disease Sister   . Colon cancer Neg Hx     Social  History Social History   Tobacco Use  . Smoking status: Former Smoker    Packs/day: 1.50    Years: 30.00    Pack years: 45.00    Types: Cigarettes    Start date: 04/24/1957    Last attempt to quit: 10/15/1986    Years since quitting: 32.0  . Smokeless tobacco: Never Used  Substance Use Topics  . Alcohol use: No    Alcohol/week: 0.0 standard drinks    Comment: Used to drink heavily at times  . Drug use: No     Allergies   Prednisone; Codeine; and Tape   Review of Systems Review of Systems  All other systems reviewed and are negative.    Physical Exam Updated Vital Signs BP (!) 151/68   Pulse (!) 59   Temp 97.6 F (36.4 C) (Oral)   Resp 15   Ht 5\' 10"  (1.778 m)   Wt 109.8 kg   SpO2 97%   BMI 34.72 kg/m   Physical Exam Vitals signs and nursing note reviewed.  Constitutional:      Appearance: He is well-developed.  HENT:     Head: Normocephalic and atraumatic.  Neck:     Comments: Mild tenderness to the base of the cervical spine Cardiovascular:     Rate and Rhythm: Normal rate and regular rhythm.     Heart sounds: No murmur.  Pulmonary:     Effort: Pulmonary effort is normal. No respiratory distress.     Breath sounds: Normal breath sounds.  Abdominal:     Tenderness: There is no abdominal tenderness. There is no guarding or rebound.     Comments: Mild generalized abdominal tenderness without guarding or rebound  Musculoskeletal:     Comments: 2+ radial and DP pulses bilaterally. There is a large area of ecchymosis and tenderness over the right shoulder with decreased range of motion.  Skin:    General: Skin  is warm and dry.     Capillary Refill: Capillary refill takes less than 2 seconds.  Neurological:     Mental Status: He is alert and oriented to person, place, and time.     Comments: Five out of five grip strength bilaterally, five out of five strength and bilateral lower extremities  Psychiatric:        Mood and Affect: Mood normal.         Behavior: Behavior normal.      ED Treatments / Results  Labs (all labs ordered are listed, but only abnormal results are displayed) Labs Reviewed  COMPREHENSIVE METABOLIC PANEL - Abnormal; Notable for the following components:      Result Value   Sodium 128 (*)    Chloride 94 (*)    Glucose, Bld 100 (*)    Calcium 8.6 (*)    Total Protein 6.0 (*)    Albumin 3.2 (*)    All other components within normal limits  CBC WITH DIFFERENTIAL/PLATELET - Abnormal; Notable for the following components:   Monocytes Absolute 1.1 (*)    Abs Immature Granulocytes 0.08 (*)    All other components within normal limits  URINALYSIS, ROUTINE W REFLEX MICROSCOPIC - Abnormal; Notable for the following components:   APPearance HAZY (*)    Protein, ur 100 (*)    All other components within normal limits  I-STAT CHEM 8, ED - Abnormal; Notable for the following components:   Sodium 129 (*)    Chloride 94 (*)    Calcium, Ion 1.13 (*)    All other components within normal limits  I-STAT TROPONIN, ED  I-STAT TROPONIN, ED    EKG EKG Interpretation  Date/Time:  Sunday November 02 2018 07:06:03 EST Ventricular Rate:  67 PR Interval:    QRS Duration: 111 QT Interval:  403 QTC Calculation: 426 R Axis:   -24 Text Interpretation:  Sinus rhythm Borderline left axis deviation Consider anterior infarct Confirmed by Quintella Reichert [phone]) on 11/02/2018 7:09:07 AM   Radiology Dg Shoulder Right  Result Date: 11/02/2018 CLINICAL DATA:  Pain following fall EXAM: RIGHT SHOULDER - 2+ VIEW COMPARISON:  None. FINDINGS: Frontal and Y scapular images were obtained. There is no acute fracture or dislocation. There is moderate osteoarthritic change in the glenohumeral joint. There is mild osteoarthritic change in the acromioclavicular joint. There is mild bony overgrowth along the distal clavicle. There is calcified pleural plaque on the right. IMPRESSION: No acute fracture or dislocation. Moderate osteoarthritic  change in the glenohumeral joint with a lesser degree of osteoarthritic change in the acromioclavicular joint. Calcified pleural plaque on the right. Electronically Signed   By: Lowella Grip III M.D.   On: 11/02/2018 07:55   Ct Head Wo Contrast  Result Date: 11/02/2018 CLINICAL DATA:  [place] down stairs today. EXAM: CT HEAD WITHOUT CONTRAST CT CERVICAL SPINE WITHOUT CONTRAST TECHNIQUE: Multidetector CT imaging of the head and cervical spine was performed following the standard protocol without intravenous contrast. Multiplanar CT image reconstructions of the cervical spine were also generated. COMPARISON:  CT head 06/29/2017 FINDINGS: CT HEAD FINDINGS Brain: Mild atrophy and mild chronic microvascular ischemic changes in the white matter similar to the prior CT. Negative for acute infarct. Negative for hemorrhage or mass. Vascular: Negative for hyperdense vessel Glue embolization of left occipital dural fistula unchanged. Skull: Negative Sinuses/Orbits: Paranasal sinuses clear. Mastoidectomy on the left. Normal orbit. Other: None CT CERVICAL SPINE FINDINGS Alignment: 3 mm anterolisthesis C3-4.  Moderate kyphosis at C5. Skull  base and vertebrae: Negative for fracture Soft tissues and spinal canal: No soft tissue mass. Atherosclerotic calcification carotid bifurcation bilaterally Disc levels: Multilevel disc and facet degeneration. Cervical spondylosis and spinal stenosis at C4-5, C5-6, and C6-7 Upper chest: Negative Other: None IMPRESSION: 1. No acute intracranial abnormality. Atrophy and chronic microvascular ischemic changes stable 2. Negative for cervical spine fracture. Cervical spondylosis as above. Electronically Signed   By: Franchot Gallo M.D.   On: 11/02/2018 10:09   Ct Chest W Contrast  Result Date: 11/02/2018 CLINICAL DATA:  Fall down stairs this morning with right shoulder pain. EXAM: CT CHEST, ABDOMEN, AND PELVIS WITH CONTRAST TECHNIQUE: Multidetector CT imaging of the chest, abdomen and  pelvis was performed following the standard protocol during bolus administration of intravenous contrast. CONTRAST:  127mL OMNIPAQUE IOHEXOL 300 MG/ML  SOLN COMPARISON:  10/28/2003 chest CT. 05/20/2013 CT abdomen/pelvis. FINDINGS: CT CHEST FINDINGS Cardiovascular: Normal heart size. No significant pericardial fluid/thickening. Left main and three-vessel coronary atherosclerosis. Atherosclerotic nonaneurysmal thoracic aorta. Normal caliber pulmonary arteries. No evidence of acute thoracic aortic injury. No central pulmonary emboli. Mediastinum/Nodes: No pneumomediastinum. No mediastinal hematoma. No discrete thyroid nodules. Unremarkable esophagus. No axillary, mediastinal or hilar lymphadenopathy. Lungs/Pleura: No pneumothorax. Extensive calcified bilateral pleural plaques, increased from 2005 chest CT. No pleural effusions. No acute consolidative airspace disease or lung masses. Basilar right lower lobe 4 mm solid pulmonary nodule (series 8/image 115), stable since 05/20/2013 CT abdomen study, considered benign. No additional significant pulmonary nodules. Nonspecific patchy reticulation and ground-glass attenuation in the dependent lungs. Musculoskeletal: No aggressive appearing focal osseous lesions. No fracture detected in the chest. Mild thoracic spondylosis. CT ABDOMEN PELVIS FINDINGS Hepatobiliary: Normal liver size. No liver laceration. Subcentimeter hypodense left liver lobe lesions are too small to characterize and are unchanged since 2014 CT, considered benign. No new liver lesions. Cholecystectomy. No biliary ductal dilatation. Pancreas: Normal, with no laceration, mass or duct dilation. Spleen: Normal size. No laceration or mass. Adrenals/Urinary Tract: Normal adrenals. No hydronephrosis. No renal laceration. No renal mass. Normal nondistended bladder. Stomach/Bowel: Small hiatal hernia. Otherwise normal nondistended stomach. Normal caliber small bowel with no small bowel wall thickening. Normal  appendix. Mild sigmoid diverticulosis, with no large bowel wall thickening or significant pericolonic fat stranding. Vascular/Lymphatic: Atherosclerotic nonaneurysmal abdominal aorta. Patent portal, splenic, hepatic and renal veins. No pathologically enlarged lymph nodes in the abdomen or pelvis. Reproductive: Mildly enlarged prostate. Other: No pneumoperitoneum, ascites or focal fluid collection. Musculoskeletal: No aggressive appearing focal osseous lesions. No fracture in the abdomen or pelvis. Moderate lumbar spondylosis. IMPRESSION: 1. No acute traumatic injury in the chest, abdomen or pelvis. 2. Extensive calcified bilateral pleural plaques, increased from 2005 chest CT, compatible with asbestos related pleural disease. No pleural effusions. 3. Nonspecific patchy reticulation and ground-glass attenuation in the dependent lungs. Follow-up high-resolution chest CT study may be obtained in 6-12 months to assess for potential interstitial lung disease, as clinically warranted. 4. Small hiatal hernia. 5. Mild sigmoid diverticulosis. 6. Mildly enlarged prostate. 7. Left main and 3 vessel coronary atherosclerosis. 8. Aortic Atherosclerosis (ICD10-I70.0). Electronically Signed   By: Ilona Sorrel M.D.   On: 11/02/2018 10:29   Ct Cervical Spine Wo Contrast  Result Date: 11/02/2018 CLINICAL DATA:  [place] down stairs today. EXAM: CT HEAD WITHOUT CONTRAST CT CERVICAL SPINE WITHOUT CONTRAST TECHNIQUE: Multidetector CT imaging of the head and cervical spine was performed following the standard protocol without intravenous contrast. Multiplanar CT image reconstructions of the cervical spine were also generated. COMPARISON:  CT head 06/29/2017  FINDINGS: CT HEAD FINDINGS Brain: Mild atrophy and mild chronic microvascular ischemic changes in the white matter similar to the prior CT. Negative for acute infarct. Negative for hemorrhage or mass. Vascular: Negative for hyperdense vessel Glue embolization of left occipital dural  fistula unchanged. Skull: Negative Sinuses/Orbits: Paranasal sinuses clear. Mastoidectomy on the left. Normal orbit. Other: None CT CERVICAL SPINE FINDINGS Alignment: 3 mm anterolisthesis C3-4.  Moderate kyphosis at C5. Skull base and vertebrae: Negative for fracture Soft tissues and spinal canal: No soft tissue mass. Atherosclerotic calcification carotid bifurcation bilaterally Disc levels: Multilevel disc and facet degeneration. Cervical spondylosis and spinal stenosis at C4-5, C5-6, and C6-7 Upper chest: Negative Other: None IMPRESSION: 1. No acute intracranial abnormality. Atrophy and chronic microvascular ischemic changes stable 2. Negative for cervical spine fracture. Cervical spondylosis as above. Electronically Signed   By: Franchot Gallo M.D.   On: 11/02/2018 10:09   Ct Abdomen Pelvis W Contrast  Result Date: 11/02/2018 CLINICAL DATA:  Fall down stairs this morning with right shoulder pain. EXAM: CT CHEST, ABDOMEN, AND PELVIS WITH CONTRAST TECHNIQUE: Multidetector CT imaging of the chest, abdomen and pelvis was performed following the standard protocol during bolus administration of intravenous contrast. CONTRAST:  15mL OMNIPAQUE IOHEXOL 300 MG/ML  SOLN COMPARISON:  10/28/2003 chest CT. 05/20/2013 CT abdomen/pelvis. FINDINGS: CT CHEST FINDINGS Cardiovascular: Normal heart size. No significant pericardial fluid/thickening. Left main and three-vessel coronary atherosclerosis. Atherosclerotic nonaneurysmal thoracic aorta. Normal caliber pulmonary arteries. No evidence of acute thoracic aortic injury. No central pulmonary emboli. Mediastinum/Nodes: No pneumomediastinum. No mediastinal hematoma. No discrete thyroid nodules. Unremarkable esophagus. No axillary, mediastinal or hilar lymphadenopathy. Lungs/Pleura: No pneumothorax. Extensive calcified bilateral pleural plaques, increased from 2005 chest CT. No pleural effusions. No acute consolidative airspace disease or lung masses. Basilar right lower lobe 4  mm solid pulmonary nodule (series 8/image 115), stable since 05/20/2013 CT abdomen study, considered benign. No additional significant pulmonary nodules. Nonspecific patchy reticulation and ground-glass attenuation in the dependent lungs. Musculoskeletal: No aggressive appearing focal osseous lesions. No fracture detected in the chest. Mild thoracic spondylosis. CT ABDOMEN PELVIS FINDINGS Hepatobiliary: Normal liver size. No liver laceration. Subcentimeter hypodense left liver lobe lesions are too small to characterize and are unchanged since 2014 CT, considered benign. No new liver lesions. Cholecystectomy. No biliary ductal dilatation. Pancreas: Normal, with no laceration, mass or duct dilation. Spleen: Normal size. No laceration or mass. Adrenals/Urinary Tract: Normal adrenals. No hydronephrosis. No renal laceration. No renal mass. Normal nondistended bladder. Stomach/Bowel: Small hiatal hernia. Otherwise normal nondistended stomach. Normal caliber small bowel with no small bowel wall thickening. Normal appendix. Mild sigmoid diverticulosis, with no large bowel wall thickening or significant pericolonic fat stranding. Vascular/Lymphatic: Atherosclerotic nonaneurysmal abdominal aorta. Patent portal, splenic, hepatic and renal veins. No pathologically enlarged lymph nodes in the abdomen or pelvis. Reproductive: Mildly enlarged prostate. Other: No pneumoperitoneum, ascites or focal fluid collection. Musculoskeletal: No aggressive appearing focal osseous lesions. No fracture in the abdomen or pelvis. Moderate lumbar spondylosis. IMPRESSION: 1. No acute traumatic injury in the chest, abdomen or pelvis. 2. Extensive calcified bilateral pleural plaques, increased from 2005 chest CT, compatible with asbestos related pleural disease. No pleural effusions. 3. Nonspecific patchy reticulation and ground-glass attenuation in the dependent lungs. Follow-up high-resolution chest CT study may be obtained in 6-12 months to assess  for potential interstitial lung disease, as clinically warranted. 4. Small hiatal hernia. 5. Mild sigmoid diverticulosis. 6. Mildly enlarged prostate. 7. Left main and 3 vessel coronary atherosclerosis. 8. Aortic Atherosclerosis (ICD10-I70.0). Electronically Signed  By: Ilona Sorrel M.D.   On: 11/02/2018 10:29   Dg Chest Port 1 View  Result Date: 11/02/2018 CLINICAL DATA:  Pain following fall EXAM: PORTABLE CHEST 1 VIEW COMPARISON:  October 03, 2018 FINDINGS: Calcified pleural plaques are noted bilaterally, stable. There is no edema or consolidation. Heart is upper normal in size with pulmonary vascularity normal. No adenopathy. There is aortic atherosclerosis. No pneumothorax. No acute fracture is appreciable. IMPRESSION: Pleural plaques appears stable. No edema or consolidation. Stable cardiac silhouette. There is aortic atherosclerosis. No traumatic appearing lesion evident. No pneumothorax. Aortic Atherosclerosis (ICD10-I70.0). Electronically Signed   By: Lowella Grip III M.D.   On: 11/02/2018 07:54    Procedures Procedures (including critical care time)  Medications Ordered in ED Medications  iohexol (OMNIPAQUE) 300 MG/ML solution 100 mL (100 mLs Intravenous Contrast Given 11/02/18 0934)  acetaminophen (TYLENOL) tablet 650 mg (650 mg Oral Given 11/02/18 1042)     Initial Impression / Assessment and Plan / ED Course  I have reviewed the triage vital signs and the nursing notes.  Pertinent labs & imaging results that were available during my care of the patient were reviewed by me and considered in my medical decision making (see chart for details).     Patient here for evaluation of near syncopal events and injuries following a mechanical fall yesterday. He does have contusion and ecchymosis to the right shoulder. Given his symptoms CT scans obtained to evaluate for retroperitoneal bleeding or significant interest thoracic or intra-abdominal injury. Imaging is negative for acute  fracture, acute hemorrhage. He has been feeling well during his ED observation without recurrent symptoms. His pain is controlled with Tylenol. Discussed with patient home care for contusions following a fall as well as near syncope. Discussed findings of asbestosis on CT scan and need for outpatient follow-up. Return precautions discussed.  Records reviewed in epic, patient with hyponatremia that is stable when compared to priors.  Final Clinical Impressions(s) / ED Diagnoses   Final diagnoses:  Fall, initial encounter  Contusion of right shoulder, initial encounter  Near syncope  Hyponatremia    ED Discharge Orders    None       Quintella Reichert, MD 11/02/18 1226

## 2018-11-03 DIAGNOSIS — S40011A Contusion of right shoulder, initial encounter: Secondary | ICD-10-CM | POA: Diagnosis not present

## 2018-11-03 DIAGNOSIS — M25511 Pain in right shoulder: Secondary | ICD-10-CM | POA: Diagnosis not present

## 2018-11-04 DIAGNOSIS — R55 Syncope and collapse: Secondary | ICD-10-CM | POA: Diagnosis not present

## 2018-11-04 DIAGNOSIS — E871 Hypo-osmolality and hyponatremia: Secondary | ICD-10-CM | POA: Diagnosis not present

## 2018-11-04 DIAGNOSIS — S46011A Strain of muscle(s) and tendon(s) of the rotator cuff of right shoulder, initial encounter: Secondary | ICD-10-CM | POA: Diagnosis not present

## 2018-11-04 DIAGNOSIS — Z6836 Body mass index (BMI) 36.0-36.9, adult: Secondary | ICD-10-CM | POA: Diagnosis not present

## 2018-11-15 DIAGNOSIS — M25511 Pain in right shoulder: Secondary | ICD-10-CM | POA: Diagnosis not present

## 2018-11-18 DIAGNOSIS — M25511 Pain in right shoulder: Secondary | ICD-10-CM | POA: Diagnosis not present

## 2018-11-18 DIAGNOSIS — S42001D Fracture of unspecified part of right clavicle, subsequent encounter for fracture with routine healing: Secondary | ICD-10-CM | POA: Diagnosis not present

## 2018-11-18 DIAGNOSIS — M66821 Spontaneous rupture of other tendons, right upper arm: Secondary | ICD-10-CM | POA: Diagnosis not present

## 2018-12-09 DIAGNOSIS — M25511 Pain in right shoulder: Secondary | ICD-10-CM | POA: Diagnosis not present

## 2018-12-16 DIAGNOSIS — E876 Hypokalemia: Secondary | ICD-10-CM | POA: Diagnosis not present

## 2018-12-16 DIAGNOSIS — E782 Mixed hyperlipidemia: Secondary | ICD-10-CM | POA: Diagnosis not present

## 2018-12-16 DIAGNOSIS — I10 Essential (primary) hypertension: Secondary | ICD-10-CM | POA: Diagnosis not present

## 2018-12-16 DIAGNOSIS — E871 Hypo-osmolality and hyponatremia: Secondary | ICD-10-CM | POA: Diagnosis not present

## 2018-12-16 DIAGNOSIS — R972 Elevated prostate specific antigen [PSA]: Secondary | ICD-10-CM | POA: Diagnosis not present

## 2018-12-16 DIAGNOSIS — R7301 Impaired fasting glucose: Secondary | ICD-10-CM | POA: Diagnosis not present

## 2018-12-18 DIAGNOSIS — I739 Peripheral vascular disease, unspecified: Secondary | ICD-10-CM | POA: Diagnosis not present

## 2018-12-18 DIAGNOSIS — S46011S Strain of muscle(s) and tendon(s) of the rotator cuff of right shoulder, sequela: Secondary | ICD-10-CM | POA: Diagnosis not present

## 2018-12-18 DIAGNOSIS — R972 Elevated prostate specific antigen [PSA]: Secondary | ICD-10-CM | POA: Diagnosis not present

## 2018-12-18 DIAGNOSIS — R55 Syncope and collapse: Secondary | ICD-10-CM | POA: Diagnosis not present

## 2018-12-18 DIAGNOSIS — E871 Hypo-osmolality and hyponatremia: Secondary | ICD-10-CM | POA: Diagnosis not present

## 2018-12-18 DIAGNOSIS — I10 Essential (primary) hypertension: Secondary | ICD-10-CM | POA: Diagnosis not present

## 2018-12-18 DIAGNOSIS — Z6836 Body mass index (BMI) 36.0-36.9, adult: Secondary | ICD-10-CM | POA: Diagnosis not present

## 2018-12-18 DIAGNOSIS — E782 Mixed hyperlipidemia: Secondary | ICD-10-CM | POA: Diagnosis not present

## 2018-12-18 DIAGNOSIS — M2022 Hallux rigidus, left foot: Secondary | ICD-10-CM | POA: Diagnosis not present

## 2018-12-18 DIAGNOSIS — J4520 Mild intermittent asthma, uncomplicated: Secondary | ICD-10-CM | POA: Diagnosis not present

## 2018-12-18 DIAGNOSIS — M79671 Pain in right foot: Secondary | ICD-10-CM | POA: Diagnosis not present

## 2018-12-18 DIAGNOSIS — M79672 Pain in left foot: Secondary | ICD-10-CM | POA: Diagnosis not present

## 2018-12-18 DIAGNOSIS — M1990 Unspecified osteoarthritis, unspecified site: Secondary | ICD-10-CM | POA: Diagnosis not present

## 2018-12-23 ENCOUNTER — Other Ambulatory Visit: Payer: Self-pay | Admitting: Family Medicine

## 2018-12-23 ENCOUNTER — Ambulatory Visit (INDEPENDENT_AMBULATORY_CARE_PROVIDER_SITE_OTHER): Payer: Medicare Other | Admitting: Internal Medicine

## 2018-12-23 ENCOUNTER — Encounter: Payer: Self-pay | Admitting: Internal Medicine

## 2018-12-23 VITALS — BP 150/70 | HR 65 | Ht 68.25 in | Wt 242.2 lb

## 2018-12-23 DIAGNOSIS — J3089 Other allergic rhinitis: Secondary | ICD-10-CM | POA: Diagnosis not present

## 2018-12-23 DIAGNOSIS — R062 Wheezing: Secondary | ICD-10-CM

## 2018-12-23 DIAGNOSIS — R0602 Shortness of breath: Secondary | ICD-10-CM | POA: Diagnosis not present

## 2018-12-23 DIAGNOSIS — M21379 Foot drop, unspecified foot: Secondary | ICD-10-CM

## 2018-12-23 DIAGNOSIS — J61 Pneumoconiosis due to asbestos and other mineral fibers: Secondary | ICD-10-CM

## 2018-12-23 DIAGNOSIS — K219 Gastro-esophageal reflux disease without esophagitis: Secondary | ICD-10-CM

## 2018-12-23 DIAGNOSIS — J302 Other seasonal allergic rhinitis: Secondary | ICD-10-CM | POA: Diagnosis not present

## 2018-12-23 DIAGNOSIS — J849 Interstitial pulmonary disease, unspecified: Secondary | ICD-10-CM | POA: Diagnosis not present

## 2018-12-23 DIAGNOSIS — R296 Repeated falls: Secondary | ICD-10-CM

## 2018-12-23 MED ORDER — FLUTICASONE FUROATE-VILANTEROL 100-25 MCG/INH IN AEPB: 1.0000 | INHALATION_SPRAY | Freq: Every day | RESPIRATORY_TRACT | 0 refills | Status: DC

## 2018-12-23 MED ORDER — ALBUTEROL SULFATE HFA 108 (90 BASE) MCG/ACT IN AERS: 2.0000 | INHALATION_SPRAY | Freq: Four times a day (QID) | RESPIRATORY_TRACT | 12 refills | Status: DC | PRN

## 2018-12-23 MED ORDER — PHENYLEPHRINE HCL 1 % NA SOLN
3.0000 [drp] | Freq: Once | NASAL | Status: AC
  Administered 2018-12-23: 3 [drp] via NASAL

## 2018-12-23 MED ORDER — METHYLPREDNISOLONE ACETATE 80 MG/ML IJ SUSP
80.0000 mg | Freq: Once | INTRAMUSCULAR | Status: AC
  Administered 2018-12-23: 80 mg via INTRAMUSCULAR

## 2018-12-23 NOTE — Assessment & Plan Note (Signed)
Explained the difference between reflux and acid injury.  He continues Nexium.  Emphasize reflux precautions.

## 2018-12-23 NOTE — Assessment & Plan Note (Signed)
Pleural plaques persist, without effusion or evidence of neoplasm.

## 2018-12-23 NOTE — Progress Notes (Signed)
HPI male former smoker followed for allergic rhinitis, asthma,  history OSA, asbestos exposure/plaques, chronic insomnia Office spirometry 05/03/15- WNL- FVC 3.33/ 78%, FEV1 2.49/ 78%, r 0.75, FEF25-75% 1.92/ 69%.. -----------------------------------------------------------------------------------------------  06/24/2018- 84 year old male former smoker followed for allergic rhinitis, asthma,  history OSA/ weight loss, asbestos exposure/plaques, chronic insomnia -----6 month follow up for asbestosis. States his breathing has been ok since last visit.  Pro-air HFA, Flonase, Difficult several months, treated for cellulitis and still has some edema right lower leg/elastic sock.  Still swimming. Breathing has been good with rare need for rescue inhaler. Sleep pattern is primarily insomnia with difficulty initiating and maintaining sleep.  Got an adjustable bed which helps.  Wife says he is not snoring.  He dropped off of CPAP years ago after losing weight.  12/22/2018- 84 year old male former smoker followed for allergic rhinitis, asthma,  history OSA/ weight loss, asbestos exposure/plaques/ interstitial lung disease, chronic insomnia -----sinus congestion, wheezing, no cough, denies chest pain/tightness Injured falling down stairs in January- ED visit with CT evaluation. He is complaining of nasal congestion now with clear discharge, no fever.  Has tried Nettie pot. Wife thinks he is wheezing more especially at.  He uses an albuterol rescue inhaler occasionally. Has noted significant heartburn about 3 times in the past year.  Maintenance Nexium. We discussed interstitial changes on recent CT-nonspecific and possibly related to asbestos, to recurrent aspiration, or idiopathic.  We will follow for progression. CT chest 11/02/2018- IMPRESSION: 1. No acute traumatic injury in the chest, abdomen or pelvis. 2. Extensive calcified bilateral pleural plaques, increased from 2005 chest CT, compatible with  asbestos related pleural disease. No pleural effusions. 3. Nonspecific patchy reticulation and ground-glass attenuation in the dependent lungs. Follow-up high-resolution chest CT study may be obtained in 6-12 months to assess for potential interstitial lung disease, as clinically warranted. 4. Small hiatal hernia. 5. Mild sigmoid diverticulosis. 6. Mildly enlarged prostate. 7. Left main and 3 vessel coronary atherosclerosis. 8. Aortic Atherosclerosis (ICD10-I70.0).  ROS See HPI  + = positive Constitutional:   No-   weight loss, night sweats, fevers, chills, fatigue, lassitude. HEENT:   + headaches,  No -difficulty swallowing, tooth/dental problems, sore throat,       No-  sneezing, itching, ear ache,  +nasal congestion, +post nasal drip,  CV:  No-   chest pain, orthopnea, PND, swelling in lower extremities, no-anasarca, dizziness, palpitations Resp: No-   shortness of breath with exertion or at rest.                productive cough,  No non-productive cough,  No-  coughing up of blood.              No-   change in color of mucus.  No- wheezing.   Skin: No-   rash or lesions. GI:  No-   heartburn, indigestion, abdominal pain, nausea, vomiting,             GU:  MS:  + joint pain or swelling. .  + back pain. Neuro-  nothing unusual  Psych:  No- change in mood or affect. No depression or anxiety.  No memory loss.  OBJ  General- Alert, Oriented, Affect-appropriate, Distress- none acute, + overweight, +talkative  Skin- rash-none, lesions- none, excoriation- none,  Lymphadenopathy- none Head- atraumatic            Eyes- Gross vision intact, PERRLA, conjunctivae clear secretions            Ears- +hard of hearing/hearing aid  Nose- + turbinate edema, no-Septal dev, mucus, polyps, erosion, perforation             Throat- Mallampati II , mucosa clear , drainage- none, tonsils- atrophic. Dental repair. Neck- flexible , trachea midline, no stridor , thyroid nl, carotid no  bruit Chest - symmetrical excursion , unlabored           Heart/CV- slow RRR , no murmur , no gallop  , no rub, nl s1 s2                           - JVD- none , edema + chronic right lower leg, varices- none , dullness-none, rub- none. +I cannot hear fine crackles or rub.           Chest wall-  Abd-  Br/ Gen/ Rectal- Not done, not indicated Extrem- +edema and erythema right calf/ankle/ sockline Neuro- grossly intact to observation. Speech is clear.

## 2018-12-23 NOTE — Assessment & Plan Note (Signed)
Nonspecific rhinitis may reflect current tree pollen season, weather change, no recent viral infection.  Not responding to conservative therapy. Plan-nasal nebulization Neo-Synephrine, Depo-Medrol

## 2018-12-23 NOTE — Patient Instructions (Signed)
Sample x 2 Breo 100    Inhale 1 puff, then rinse mouth, once daily   See if this helps the wheeze and shortness of breath  Order- schedule CT chest High Resolution, no contrast, future- in 6 months             Dx asbestosis, ILD protocol  Ok to continue using your albuterol rescue inhaler if needed  Order- neb neo nasal     Dx seasonal and perennial allergic rhinitis             Depo 80

## 2018-12-23 NOTE — Progress Notes (Signed)
Patient seen in the office today and instructed on use of Breo 100.  Patient expressed understanding and demonstrated technique. 

## 2018-12-23 NOTE — Assessment & Plan Note (Signed)
Several potential etiologies-asbestos, aspiration, idiopathic.  Discussed with him and his wife.  At age 84, conservative observation might be appropriate. Plan-high resolution chest CT in 6 months

## 2018-12-31 ENCOUNTER — Other Ambulatory Visit: Payer: Self-pay

## 2018-12-31 ENCOUNTER — Emergency Department (HOSPITAL_COMMUNITY): Payer: Medicare Other | Admitting: Anesthesiology

## 2018-12-31 ENCOUNTER — Emergency Department (HOSPITAL_COMMUNITY)
Admission: EM | Admit: 2018-12-31 | Discharge: 2018-12-31 | Disposition: A | Discharge status: Home / Self Care | Payer: Medicare Other | Attending: Emergency Medicine | Admitting: Emergency Medicine

## 2018-12-31 ENCOUNTER — Emergency Department (HOSPITAL_COMMUNITY): Payer: Medicare Other

## 2018-12-31 ENCOUNTER — Encounter (HOSPITAL_COMMUNITY)
Admission: EM | Disposition: A | Discharge status: Home / Self Care | Payer: Self-pay | Source: Home / Self Care | Attending: Emergency Medicine

## 2018-12-31 ENCOUNTER — Encounter (HOSPITAL_COMMUNITY): Payer: Self-pay | Admitting: Emergency Medicine

## 2018-12-31 DIAGNOSIS — S68111A Complete traumatic metacarpophalangeal amputation of left index finger, initial encounter: Secondary | ICD-10-CM | POA: Diagnosis not present

## 2018-12-31 DIAGNOSIS — G4733 Obstructive sleep apnea (adult) (pediatric): Secondary | ICD-10-CM | POA: Insufficient documentation

## 2018-12-31 DIAGNOSIS — Z77090 Contact with and (suspected) exposure to asbestos: Secondary | ICD-10-CM | POA: Insufficient documentation

## 2018-12-31 DIAGNOSIS — Z23 Encounter for immunization: Secondary | ICD-10-CM | POA: Diagnosis not present

## 2018-12-31 DIAGNOSIS — Z96653 Presence of artificial knee joint, bilateral: Secondary | ICD-10-CM | POA: Diagnosis not present

## 2018-12-31 DIAGNOSIS — Y9389 Activity, other specified: Secondary | ICD-10-CM | POA: Diagnosis not present

## 2018-12-31 DIAGNOSIS — Z6836 Body mass index (BMI) 36.0-36.9, adult: Secondary | ICD-10-CM | POA: Insufficient documentation

## 2018-12-31 DIAGNOSIS — Z7982 Long term (current) use of aspirin: Secondary | ICD-10-CM | POA: Insufficient documentation

## 2018-12-31 DIAGNOSIS — Z79899 Other long term (current) drug therapy: Secondary | ICD-10-CM | POA: Insufficient documentation

## 2018-12-31 DIAGNOSIS — F419 Anxiety disorder, unspecified: Secondary | ICD-10-CM | POA: Diagnosis not present

## 2018-12-31 DIAGNOSIS — S61432A Puncture wound without foreign body of left hand, initial encounter: Secondary | ICD-10-CM | POA: Diagnosis not present

## 2018-12-31 DIAGNOSIS — W320XXA Accidental handgun discharge, initial encounter: Secondary | ICD-10-CM | POA: Insufficient documentation

## 2018-12-31 DIAGNOSIS — J849 Interstitial pulmonary disease, unspecified: Secondary | ICD-10-CM | POA: Diagnosis not present

## 2018-12-31 DIAGNOSIS — Z87891 Personal history of nicotine dependence: Secondary | ICD-10-CM | POA: Diagnosis not present

## 2018-12-31 DIAGNOSIS — K219 Gastro-esophageal reflux disease without esophagitis: Secondary | ICD-10-CM | POA: Diagnosis not present

## 2018-12-31 DIAGNOSIS — J4520 Mild intermittent asthma, uncomplicated: Secondary | ICD-10-CM | POA: Insufficient documentation

## 2018-12-31 DIAGNOSIS — E785 Hyperlipidemia, unspecified: Secondary | ICD-10-CM | POA: Insufficient documentation

## 2018-12-31 DIAGNOSIS — S68611A Complete traumatic transphalangeal amputation of left index finger, initial encounter: Secondary | ICD-10-CM | POA: Diagnosis not present

## 2018-12-31 DIAGNOSIS — H9192 Unspecified hearing loss, left ear: Secondary | ICD-10-CM | POA: Diagnosis not present

## 2018-12-31 DIAGNOSIS — N529 Male erectile dysfunction, unspecified: Secondary | ICD-10-CM | POA: Diagnosis not present

## 2018-12-31 DIAGNOSIS — S68621A Partial traumatic transphalangeal amputation of left index finger, initial encounter: Secondary | ICD-10-CM | POA: Insufficient documentation

## 2018-12-31 DIAGNOSIS — Y999 Unspecified external cause status: Secondary | ICD-10-CM | POA: Diagnosis not present

## 2018-12-31 DIAGNOSIS — W3400XA Accidental discharge from unspecified firearms or gun, initial encounter: Secondary | ICD-10-CM | POA: Diagnosis not present

## 2018-12-31 DIAGNOSIS — S68119A Complete traumatic metacarpophalangeal amputation of unspecified finger, initial encounter: Secondary | ICD-10-CM

## 2018-12-31 DIAGNOSIS — I471 Supraventricular tachycardia: Secondary | ICD-10-CM | POA: Insufficient documentation

## 2018-12-31 DIAGNOSIS — Y929 Unspecified place or not applicable: Secondary | ICD-10-CM | POA: Diagnosis not present

## 2018-12-31 DIAGNOSIS — S61213A Laceration without foreign body of left middle finger without damage to nail, initial encounter: Secondary | ICD-10-CM | POA: Diagnosis not present

## 2018-12-31 DIAGNOSIS — M1990 Unspecified osteoarthritis, unspecified site: Secondary | ICD-10-CM | POA: Diagnosis not present

## 2018-12-31 DIAGNOSIS — I10 Essential (primary) hypertension: Secondary | ICD-10-CM | POA: Insufficient documentation

## 2018-12-31 DIAGNOSIS — E669 Obesity, unspecified: Secondary | ICD-10-CM | POA: Insufficient documentation

## 2018-12-31 DIAGNOSIS — Z7951 Long term (current) use of inhaled steroids: Secondary | ICD-10-CM | POA: Diagnosis not present

## 2018-12-31 HISTORY — PX: MINOR AMPUTATION OF DIGIT: SHX6242

## 2018-12-31 LAB — CBC WITH DIFFERENTIAL/PLATELET
ABS IMMATURE GRANULOCYTES: 0.06 10*3/uL (ref 0.00–0.07)
Basophils Absolute: 0.1 10*3/uL (ref 0.0–0.1)
Basophils Relative: 1 %
Eosinophils Absolute: 0.2 10*3/uL (ref 0.0–0.5)
Eosinophils Relative: 2 %
HCT: 39.6 % (ref 39.0–52.0)
Hemoglobin: 13 g/dL (ref 13.0–17.0)
Immature Granulocytes: 1 %
Lymphocytes Relative: 35 %
Lymphs Abs: 4 10*3/uL (ref 0.7–4.0)
MCH: 30 pg (ref 26.0–34.0)
MCHC: 32.8 g/dL (ref 30.0–36.0)
MCV: 91.5 fL (ref 80.0–100.0)
Monocytes Absolute: 1.4 10*3/uL — ABNORMAL HIGH (ref 0.1–1.0)
Monocytes Relative: 12 %
NEUTROS ABS: 5.8 10*3/uL (ref 1.7–7.7)
Neutrophils Relative %: 49 %
Platelets: 389 10*3/uL (ref 150–400)
RBC: 4.33 MIL/uL (ref 4.22–5.81)
RDW: 14.4 % (ref 11.5–15.5)
WBC: 11.6 10*3/uL — ABNORMAL HIGH (ref 4.0–10.5)
nRBC: 0 % (ref 0.0–0.2)

## 2018-12-31 LAB — BASIC METABOLIC PANEL
ANION GAP: 9 (ref 5–15)
BUN: 14 mg/dL (ref 8–23)
CO2: 22 mmol/L (ref 22–32)
Calcium: 8.7 mg/dL — ABNORMAL LOW (ref 8.9–10.3)
Chloride: 100 mmol/L (ref 98–111)
Creatinine, Ser: 0.69 mg/dL (ref 0.61–1.24)
GFR calc Af Amer: >60 mL/min (ref >60)
GFR calc non Af Amer: >60 mL/min (ref >60)
Glucose, Bld: 98 mg/dL (ref 70–99)
Potassium: 3.8 mmol/L (ref 3.5–5.1)
Sodium: 131 mmol/L — ABNORMAL LOW (ref 135–145)

## 2018-12-31 LAB — PROTIME-INR
INR: 1 (ref 0.8–1.2)
Prothrombin Time: 12.6 seconds (ref 11.4–15.2)

## 2018-12-31 SURGERY — MINOR AMPUTATION OF DIGIT
Anesthesia: General | Site: Finger | Laterality: Left

## 2018-12-31 MED ORDER — FENTANYL CITRATE (PF) 100 MCG/2ML IJ SOLN
50.0000 ug | Freq: Once | INTRAMUSCULAR | Status: AC
  Administered 2018-12-31: 50 ug via INTRAVENOUS

## 2018-12-31 MED ORDER — CEFAZOLIN SODIUM-DEXTROSE 1-4 GM/50ML-% IV SOLN
1.0000 g | Freq: Once | INTRAVENOUS | Status: AC
  Administered 2018-12-31: 1 g via INTRAVENOUS
  Filled 2018-12-31: qty 50

## 2018-12-31 MED ORDER — POVIDONE-IODINE 10 % EX SOLN
CUTANEOUS | Status: AC
  Administered 2018-12-31: 01:00:00
  Filled 2018-12-31: qty 15

## 2018-12-31 MED ORDER — PROPOFOL 10 MG/ML IV BOLUS
INTRAVENOUS | Status: AC
  Filled 2018-12-31: qty 20

## 2018-12-31 MED ORDER — SODIUM CHLORIDE 0.9 % IV SOLN: Freq: Once | INTRAVENOUS | Status: DC

## 2018-12-31 MED ORDER — LIDOCAINE 2% (20 MG/ML) 5 ML SYRINGE
INTRAMUSCULAR | Status: DC | PRN
  Administered 2018-12-31: 60 mg via INTRAVENOUS

## 2018-12-31 MED ORDER — FENTANYL CITRATE (PF) 250 MCG/5ML IJ SOLN
INTRAMUSCULAR | Status: AC
  Filled 2018-12-31: qty 5

## 2018-12-31 MED ORDER — PROPOFOL 10 MG/ML IV BOLUS
INTRAVENOUS | Status: DC | PRN
  Administered 2018-12-31: 150 mg via INTRAVENOUS

## 2018-12-31 MED ORDER — OXYCODONE HCL 5 MG PO TABS
ORAL_TABLET | ORAL | Status: AC
  Filled 2018-12-31: qty 1

## 2018-12-31 MED ORDER — ONDANSETRON HCL 4 MG/2ML IJ SOLN: 4.0000 mg | Freq: Four times a day (QID) | INTRAMUSCULAR | Status: DC | PRN

## 2018-12-31 MED ORDER — DEXAMETHASONE SODIUM PHOSPHATE 10 MG/ML IJ SOLN
INTRAMUSCULAR | Status: DC | PRN
  Administered 2018-12-31: 5 mg via INTRAVENOUS

## 2018-12-31 MED ORDER — 0.9 % SODIUM CHLORIDE (POUR BTL) OPTIME
TOPICAL | Status: DC | PRN
  Administered 2018-12-31: 1000 mL

## 2018-12-31 MED ORDER — FENTANYL CITRATE (PF) 100 MCG/2ML IJ SOLN
INTRAMUSCULAR | Status: DC | PRN
  Administered 2018-12-31: 50 ug via INTRAVENOUS
  Administered 2018-12-31: 100 ug via INTRAVENOUS

## 2018-12-31 MED ORDER — DEXAMETHASONE SODIUM PHOSPHATE 10 MG/ML IJ SOLN
INTRAMUSCULAR | Status: AC
  Filled 2018-12-31: qty 1

## 2018-12-31 MED ORDER — OXYCODONE HCL 5 MG/5ML PO SOLN: 5.0000 mg | Freq: Once | ORAL | Status: AC | PRN

## 2018-12-31 MED ORDER — ALBUTEROL SULFATE (2.5 MG/3ML) 0.083% IN NEBU
2.5000 mg | INHALATION_SOLUTION | Freq: Once | RESPIRATORY_TRACT | Status: AC
  Administered 2018-12-31: 2.5 mg via RESPIRATORY_TRACT

## 2018-12-31 MED ORDER — SODIUM CHLORIDE 0.9 % IV SOLN
INTRAVENOUS | Status: DC | PRN
  Administered 2018-12-31: 50 ug/min via INTRAVENOUS

## 2018-12-31 MED ORDER — LACTATED RINGERS IV SOLN
INTRAVENOUS | Status: DC | PRN
  Administered 2018-12-31: 03:00:00 via INTRAVENOUS

## 2018-12-31 MED ORDER — ONDANSETRON HCL 4 MG/2ML IJ SOLN
INTRAMUSCULAR | Status: DC | PRN
  Administered 2018-12-31: 4 mg via INTRAVENOUS

## 2018-12-31 MED ORDER — LIDOCAINE 2% (20 MG/ML) 5 ML SYRINGE
INTRAMUSCULAR | Status: AC
  Filled 2018-12-31: qty 5

## 2018-12-31 MED ORDER — EPHEDRINE 5 MG/ML INJ
INTRAVENOUS | Status: AC
  Filled 2018-12-31: qty 10

## 2018-12-31 MED ORDER — SUCCINYLCHOLINE CHLORIDE 20 MG/ML IJ SOLN
INTRAMUSCULAR | Status: DC | PRN
  Administered 2018-12-31: 120 mg via INTRAVENOUS

## 2018-12-31 MED ORDER — ONDANSETRON HCL 4 MG/2ML IJ SOLN
INTRAMUSCULAR | Status: AC
  Filled 2018-12-31: qty 2

## 2018-12-31 MED ORDER — OXYCODONE HCL 5 MG PO TABS
5.0000 mg | ORAL_TABLET | Freq: Once | ORAL | Status: AC | PRN
  Administered 2018-12-31: 5 mg via ORAL

## 2018-12-31 MED ORDER — BUPIVACAINE HCL (PF) 0.25 % IJ SOLN
INTRAMUSCULAR | Status: AC
  Filled 2018-12-31: qty 30

## 2018-12-31 MED ORDER — BUPIVACAINE HCL (PF) 0.25 % IJ SOLN
INTRAMUSCULAR | Status: DC | PRN
  Administered 2018-12-31: 10 mL

## 2018-12-31 MED ORDER — FENTANYL CITRATE (PF) 100 MCG/2ML IJ SOLN
INTRAMUSCULAR | Status: AC
  Filled 2018-12-31: qty 2

## 2018-12-31 MED ORDER — HYDROCODONE-ACETAMINOPHEN 5-325 MG PO TABS: 1.0000 | ORAL_TABLET | ORAL | 0 refills | Status: DC | PRN

## 2018-12-31 MED ORDER — FENTANYL CITRATE (PF) 100 MCG/2ML IJ SOLN
50.0000 ug | Freq: Once | INTRAMUSCULAR | Status: AC
  Administered 2018-12-31: 50 ug via INTRAVENOUS
  Filled 2018-12-31: qty 2

## 2018-12-31 MED ORDER — CEFAZOLIN SODIUM-DEXTROSE 2-4 GM/100ML-% IV SOLN
INTRAVENOUS | Status: AC
  Filled 2018-12-31: qty 100

## 2018-12-31 MED ORDER — CEFAZOLIN SODIUM-DEXTROSE 2-3 GM-%(50ML) IV SOLR
INTRAVENOUS | Status: DC | PRN
  Administered 2018-12-31: 2 g via INTRAVENOUS

## 2018-12-31 MED ORDER — TETANUS-DIPHTH-ACELL PERTUSSIS 5-2.5-18.5 LF-MCG/0.5 IM SUSP
0.5000 mL | Freq: Once | INTRAMUSCULAR | Status: AC
  Administered 2018-12-31: 0.5 mL via INTRAMUSCULAR
  Filled 2018-12-31: qty 0.5

## 2018-12-31 MED ORDER — SUCCINYLCHOLINE CHLORIDE 200 MG/10ML IV SOSY
PREFILLED_SYRINGE | INTRAVENOUS | Status: AC
  Filled 2018-12-31: qty 10

## 2018-12-31 MED ORDER — EPHEDRINE SULFATE 50 MG/ML IJ SOLN
INTRAMUSCULAR | Status: DC | PRN
  Administered 2018-12-31 (×2): 5 mg via INTRAVENOUS

## 2018-12-31 MED ORDER — FENTANYL CITRATE (PF) 100 MCG/2ML IJ SOLN
25.0000 ug | INTRAMUSCULAR | Status: DC | PRN
  Administered 2018-12-31: 25 ug via INTRAVENOUS

## 2018-12-31 MED ORDER — ALBUTEROL SULFATE (2.5 MG/3ML) 0.083% IN NEBU
INHALATION_SOLUTION | RESPIRATORY_TRACT | Status: AC
  Filled 2018-12-31: qty 3

## 2018-12-31 SURGICAL SUPPLY — 57 items
APL SKNCLS STERI-STRIP NONHPOA (GAUZE/BANDAGES/DRESSINGS)
BANDAGE ACE 3X5.8 VEL STRL LF (GAUZE/BANDAGES/DRESSINGS) ×4 IMPLANT
BANDAGE ACE 4X5 VEL STRL LF (GAUZE/BANDAGES/DRESSINGS) IMPLANT
BANDAGE ELASTIC 3 VELCRO ST LF (GAUZE/BANDAGES/DRESSINGS) ×3 IMPLANT
BANDAGE ELASTIC 4 VELCRO ST LF (GAUZE/BANDAGES/DRESSINGS) ×3 IMPLANT
BENZOIN TINCTURE PRP APPL 2/3 (GAUZE/BANDAGES/DRESSINGS) IMPLANT
BLADE CLIPPER SURG (BLADE) IMPLANT
BNDG COHESIVE 1X5 TAN STRL LF (GAUZE/BANDAGES/DRESSINGS) IMPLANT
BNDG CONFORM 2 STRL LF (GAUZE/BANDAGES/DRESSINGS) IMPLANT
BNDG GAUZE ELAST 4 BULKY (GAUZE/BANDAGES/DRESSINGS) ×6 IMPLANT
CLOSURE WOUND 1/2 X4 (GAUZE/BANDAGES/DRESSINGS)
CORDS BIPOLAR (ELECTRODE) ×4 IMPLANT
COVER SURGICAL LIGHT HANDLE (MISCELLANEOUS) ×4 IMPLANT
COVER WAND RF STERILE (DRAPES) ×4 IMPLANT
CUFF TOURNIQUET SINGLE 18IN (TOURNIQUET CUFF) ×4 IMPLANT
CUFF TOURNIQUET SINGLE 24IN (TOURNIQUET CUFF) IMPLANT
DRAPE SURG 17X23 STRL (DRAPES) ×4 IMPLANT
DRSG EMULSION OIL 3X3 NADH (GAUZE/BANDAGES/DRESSINGS) IMPLANT
DRSG MEPITEL 4X7.2 (GAUZE/BANDAGES/DRESSINGS) ×4 IMPLANT
GAUZE SPONGE 2X2 8PLY STRL LF (GAUZE/BANDAGES/DRESSINGS) IMPLANT
GAUZE SPONGE 4X4 12PLY STRL (GAUZE/BANDAGES/DRESSINGS) ×3 IMPLANT
GAUZE XEROFORM 1X8 LF (GAUZE/BANDAGES/DRESSINGS) IMPLANT
GAUZE XEROFORM 5X9 LF (GAUZE/BANDAGES/DRESSINGS) ×3 IMPLANT
GLOVE BIOGEL M 8.0 STRL (GLOVE) ×4 IMPLANT
GLOVE SS BIOGEL STRL SZ 8 (GLOVE) ×2 IMPLANT
GLOVE SUPERSENSE BIOGEL SZ 8 (GLOVE) ×2
GOWN STRL REUS W/ TWL LRG LVL3 (GOWN DISPOSABLE) ×4 IMPLANT
GOWN STRL REUS W/ TWL XL LVL3 (GOWN DISPOSABLE) ×4 IMPLANT
GOWN STRL REUS W/TWL LRG LVL3 (GOWN DISPOSABLE) ×8
GOWN STRL REUS W/TWL XL LVL3 (GOWN DISPOSABLE) ×8
KIT BASIN OR (CUSTOM PROCEDURE TRAY) ×4 IMPLANT
KIT TURNOVER KIT B (KITS) ×4 IMPLANT
MANIFOLD NEPTUNE II (INSTRUMENTS) ×4 IMPLANT
NDL HYPO 25GX1X1/2 BEV (NEEDLE) IMPLANT
NEEDLE HYPO 25GX1X1/2 BEV (NEEDLE) IMPLANT
NS IRRIG 1000ML POUR BTL (IV SOLUTION) ×4 IMPLANT
PACK ORTHO EXTREMITY (CUSTOM PROCEDURE TRAY) ×4 IMPLANT
PAD ARMBOARD 7.5X6 YLW CONV (MISCELLANEOUS) ×8 IMPLANT
SCRUB BETADINE 4OZ XXX (MISCELLANEOUS) ×4 IMPLANT
SOL PREP POV-IOD 4OZ 10% (MISCELLANEOUS) ×8 IMPLANT
SPECIMEN JAR SMALL (MISCELLANEOUS) ×4 IMPLANT
SPLINT FIBERGLASS 3X12 (CAST SUPPLIES) ×3 IMPLANT
SPONGE GAUZE 2X2 STER 10/PKG (GAUZE/BANDAGES/DRESSINGS)
STRIP CLOSURE SKIN 1/2X4 (GAUZE/BANDAGES/DRESSINGS) IMPLANT
SUT ETHILON 4 0 P 3 18 (SUTURE) IMPLANT
SUT ETHILON 5 0 P 3 18 (SUTURE)
SUT MERSILENE 4 0 P 3 (SUTURE) IMPLANT
SUT NYLON ETHILON 5-0 P-3 1X18 (SUTURE) IMPLANT
SUT PROLENE 4 0 P 3 18 (SUTURE) ×3 IMPLANT
SUT PROLENE 4 0 PS 2 18 (SUTURE) ×6 IMPLANT
SYR CONTROL 10ML LL (SYRINGE) IMPLANT
TOWEL OR 17X24 6PK STRL BLUE (TOWEL DISPOSABLE) ×4 IMPLANT
TOWEL OR 17X26 10 PK STRL BLUE (TOWEL DISPOSABLE) ×4 IMPLANT
TUBE CONNECTING 12'X1/4 (SUCTIONS)
TUBE CONNECTING 12X1/4 (SUCTIONS) IMPLANT
UNDERPAD 30X30 (UNDERPADS AND DIAPERS) ×4 IMPLANT
WATER STERILE IRR 1000ML POUR (IV SOLUTION) ×4 IMPLANT
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
## 2018-12-31 NOTE — Anesthesia Procedure Notes (Signed)
Procedure Name: Intubation Date/Time: 12/31/2018 3:37 AM Performed by: Suzy Bouchard, CRNA Pre-anesthesia Checklist: Patient identified, Emergency Drugs available, Suction available, Patient being monitored and Timeout performed Patient Re-evaluated:Patient Re-evaluated prior to induction Oxygen Delivery Method: Circle system utilized Preoxygenation: Pre-oxygenation with 100% oxygen Induction Type: IV induction and Rapid sequence Laryngoscope Size: Miller and 2 Grade View: Grade I Tube type: Oral Tube size: 7.5 mm Number of attempts: 1 Airway Equipment and Method: Stylet Placement Confirmation: ETT inserted through vocal cords under direct vision,  positive ETCO2 and breath sounds checked- equal and bilateral Secured at: 23 cm Tube secured with: Tape Dental Injury: Teeth and Oropharynx as per pre-operative assessment
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
## 2018-12-31 NOTE — ED Notes (Signed)
Patient transported to X-ray 

## 2018-12-31 NOTE — Discharge Instructions (Signed)
Please keep dressing in place. Do not remove it. The splint must stay clean and dry Keep the arm elevated to prevent pain and swelling Take pain medication as needed. Transition to over the counter pain medication as your pain improves Please call the office to schedule appointments with therapy in the next 5-7 days and Dr. Jeannie Fend in 10-14 days Move the unaffected digits and extremity regularly.
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
## 2018-12-31 NOTE — Transfer of Care (Signed)
Immediate Anesthesia Transfer of Care Note  Patient: Julian Fowler  Procedure(s) Performed: REVISION AMPUTATION OF LEFT INDEX FINGER, IRRIGATION AND DEBRIDEMENT LEFT INDEX FINGER (Left Finger)  Patient Location: PACU  Anesthesia Type:General  Level of Consciousness: awake, alert  and oriented  Airway & Oxygen Therapy: Patient Spontanous Breathing and Patient connected to face mask oxygen  Post-op Assessment: Report given to RN and Post -op Vital signs reviewed and stable  Post vital signs: Reviewed and stable  Last Vitals:  Vitals Value Taken Time  BP    Temp    Pulse    Resp    SpO2      Last Pain:  Vitals:   12/31/18 0233  TempSrc: Oral  PainSc:          Complications: No apparent anesthesia complications

## 2018-12-31 NOTE — ED Notes (Signed)
Report given to [facility] 

## 2018-12-31 NOTE — ED Notes (Signed)
Patient transported to [facility] via [facility]. Report given to [facility] charge nurse in ED.

## 2018-12-31 NOTE — Anesthesia Preprocedure Evaluation (Addendum)
Anesthesia Evaluation  Patient identified by MRN, date of birth, ID band Patient awake    Reviewed: Allergy & Precautions, H&P , NPO status , Patient's Chart, lab work & pertinent test results  Airway Mallampati: II   Neck ROM: full    Dental  (+) Teeth Intact, Partial Upper, Partial Lower, Dental Advisory Given   Pulmonary asthma , sleep apnea , former smoker,    breath sounds clear to auscultation       Cardiovascular hypertension,  Rhythm:regular Rate:Normal     Neuro/Psych PSYCHIATRIC DISORDERS Anxiety    GI/Hepatic hiatal hernia, GERD  ,  Endo/Other  obese  Renal/GU      Musculoskeletal  (+) Arthritis ,   Abdominal   Peds  Hematology   Anesthesia Other Findings   Reproductive/Obstetrics                            Anesthesia Physical Anesthesia Plan  ASA: III  Anesthesia Plan: General   Post-op Pain Management:    Induction: Intravenous  PONV Risk Score and Plan: 2 and Ondansetron, Dexamethasone and Treatment may vary due to age or medical condition  Airway Management Planned: LMA  Additional Equipment:   Intra-op Plan:   Post-operative Plan:   Informed Consent: I have reviewed the patients History and Physical, chart, labs and discussed the procedure including the risks, benefits and alternatives for the proposed anesthesia with the patient or authorized representative who has indicated his/her understanding and acceptance.       Plan Discussed with: CRNA, Anesthesiologist and Surgeon  Anesthesia Plan Comments:         Anesthesia Quick Evaluation

## 2019-01-01 ENCOUNTER — Encounter (HOSPITAL_COMMUNITY): Payer: Self-pay | Admitting: Orthopaedic Surgery

## 2019-01-02 DIAGNOSIS — M79642 Pain in left hand: Secondary | ICD-10-CM | POA: Diagnosis not present

## 2019-01-05 ENCOUNTER — Telehealth: Payer: Self-pay | Admitting: *Deleted

## 2019-01-05 ENCOUNTER — Other Ambulatory Visit: Payer: Medicare Other

## 2019-01-05 NOTE — Telephone Encounter (Signed)
   Primary Cardiologist:  Suresh Koneswaran, MD   Patient contacted.  History reviewed.  No symptoms to suggest any unstable cardiac conditions.  Based on discussion, with current pandemic situation, we will be postponing this appointment till July 2020.  If symptoms change, he has been instructed to contact our office.     .  

## 2019-01-05 NOTE — Anesthesia Postprocedure Evaluation (Signed)
Anesthesia Post Note  Patient: Julian Fowler  Procedure(s) Performed: REVISION AMPUTATION OF LEFT INDEX FINGER, IRRIGATION AND DEBRIDEMENT LEFT INDEX FINGER (Left Finger)     Patient location during evaluation: PACU Anesthesia Type: General Level of consciousness: awake and alert Pain management: pain level controlled Vital Signs Assessment: post-procedure vital signs reviewed and stable Respiratory status: spontaneous breathing, nonlabored ventilation, respiratory function stable and patient connected to nasal cannula oxygen Cardiovascular status: blood pressure returned to baseline and stable Postop Assessment: no apparent nausea or vomiting Anesthetic complications: no    Last Vitals:  Vitals:   12/31/18 0515 12/31/18 0524  BP:  (!) 166/83  Pulse: 65 73  Resp: 13 17  Temp:  (!) 36.4 C  SpO2: 100% 96%    Last Pain:  Vitals:   12/31/18 0509  TempSrc:   PainSc: [place]

## 2019-01-06 DIAGNOSIS — S42001D Fracture of unspecified part of right clavicle, subsequent encounter for fracture with routine healing: Secondary | ICD-10-CM | POA: Diagnosis not present

## 2019-01-06 DIAGNOSIS — M25511 Pain in right shoulder: Secondary | ICD-10-CM | POA: Diagnosis not present

## 2019-01-06 DIAGNOSIS — M7541 Impingement syndrome of right shoulder: Secondary | ICD-10-CM | POA: Diagnosis not present

## 2019-01-06 DIAGNOSIS — S68621A Partial traumatic transphalangeal amputation of left index finger, initial encounter: Secondary | ICD-10-CM | POA: Diagnosis not present

## 2019-01-08 ENCOUNTER — Ambulatory Visit: Payer: Medicare Other | Admitting: Cardiovascular Disease

## 2019-01-09 DIAGNOSIS — Z4789 Encounter for other orthopedic aftercare: Secondary | ICD-10-CM | POA: Diagnosis not present

## 2019-01-09 DIAGNOSIS — M79645 Pain in left finger(s): Secondary | ICD-10-CM | POA: Diagnosis not present

## 2019-01-09 DIAGNOSIS — M79642 Pain in left hand: Secondary | ICD-10-CM | POA: Diagnosis not present

## 2019-01-09 DIAGNOSIS — S68611A Complete traumatic transphalangeal amputation of left index finger, initial encounter: Secondary | ICD-10-CM | POA: Diagnosis not present

## 2019-01-09 DIAGNOSIS — S68611D Complete traumatic transphalangeal amputation of left index finger, subsequent encounter: Secondary | ICD-10-CM | POA: Diagnosis not present

## 2019-01-16 DIAGNOSIS — M79642 Pain in left hand: Secondary | ICD-10-CM | POA: Diagnosis not present

## 2019-02-02 ENCOUNTER — Other Ambulatory Visit: Payer: Self-pay | Admitting: Cardiovascular Disease

## 2019-02-02 ENCOUNTER — Other Ambulatory Visit: Payer: Self-pay | Admitting: Physician Assistant

## 2019-02-03 ENCOUNTER — Other Ambulatory Visit: Payer: Medicare Other

## 2019-02-07 IMAGING — DX DG CHEST 1V PORT
1 series · 1 of 1 positions shown · non-contrast
Comparison: October 03, 2018

CLINICAL DATA: Pain following fall

EXAM:
PORTABLE CHEST 1 VIEW

[chest ap]
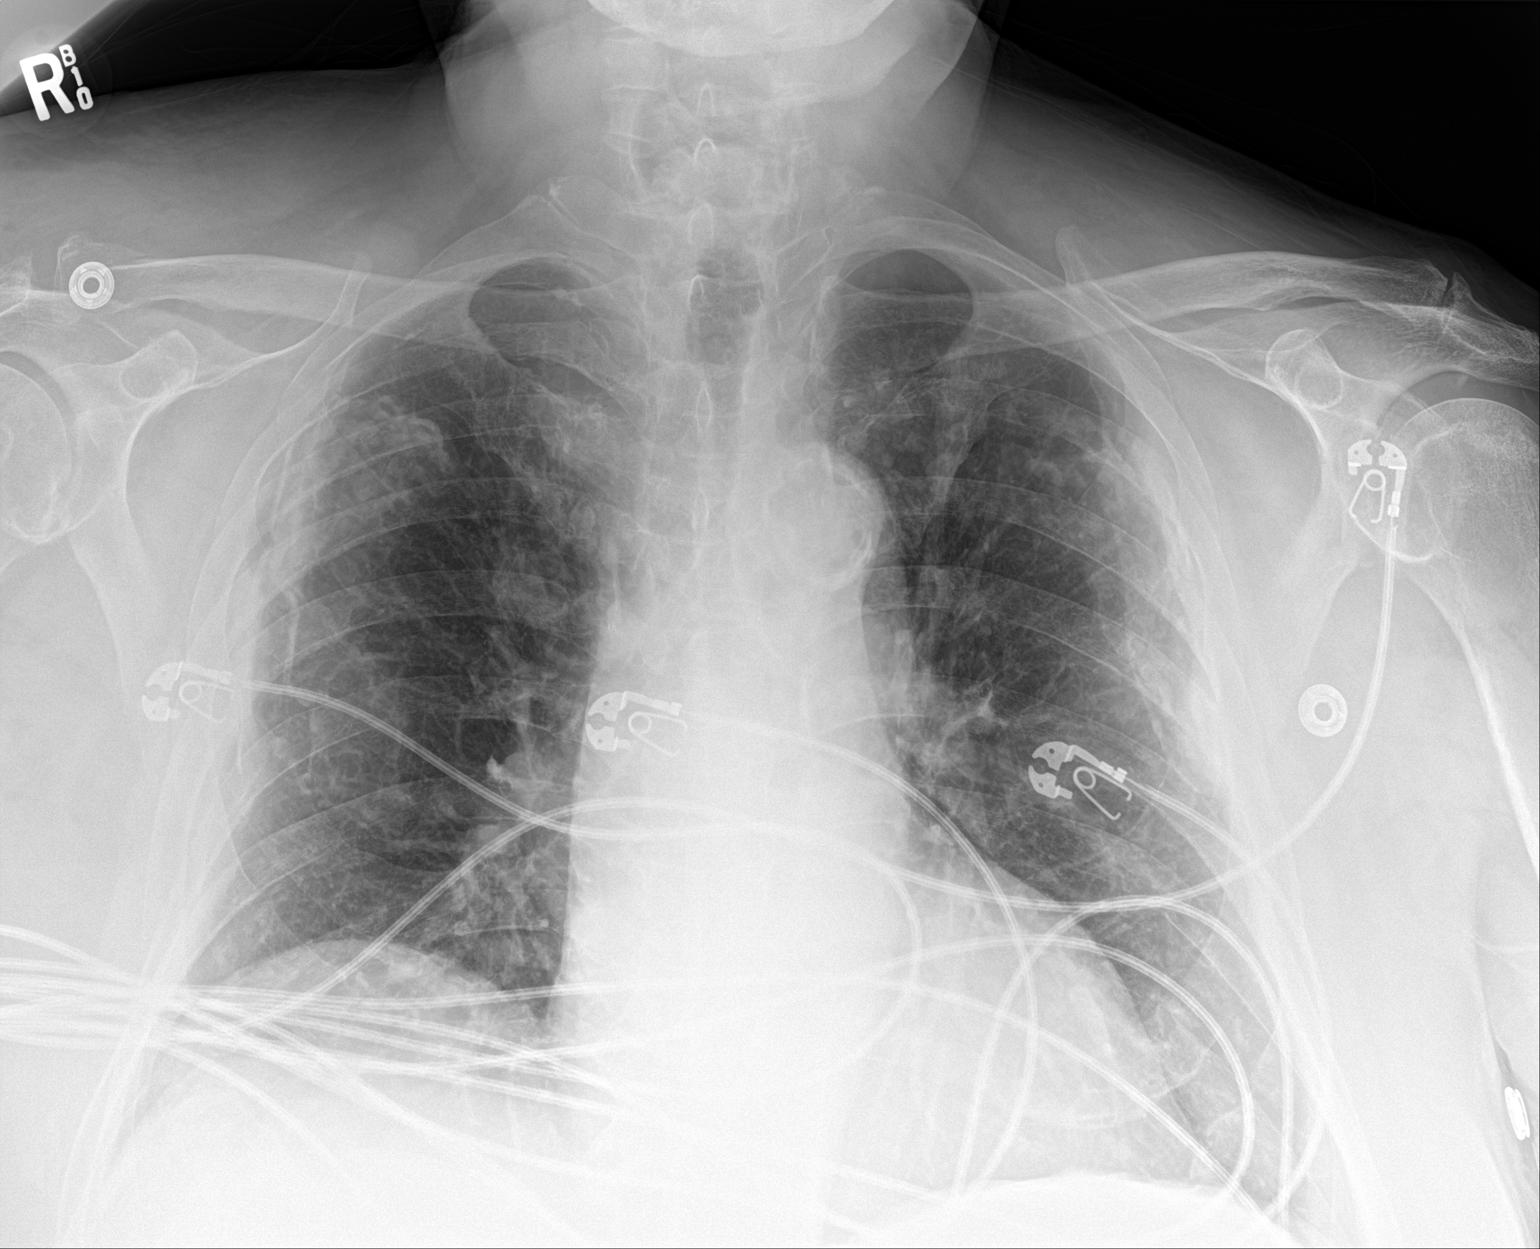

[1 of 1 positions shown; findings below may reference images not displayed]

FINDINGS: Calcified pleural plaques are noted bilaterally, stable. There is no
edema or consolidation. Heart is upper normal in size with pulmonary
vascularity normal. No adenopathy. There is aortic atherosclerosis.
No pneumothorax. No acute fracture is appreciable.
IMPRESSION: Pleural plaques appears stable. No edema or consolidation. Stable
cardiac silhouette. There is aortic atherosclerosis. No traumatic
appearing lesion evident. No pneumothorax.

Aortic Atherosclerosis ([id number].O).

## 2019-02-07 IMAGING — DX DG SHOULDER 2+V*R*
2 series · 2 of 2 positions shown · non-contrast
Comparison: None.

CLINICAL DATA: Pain following fall

EXAM:
RIGHT SHOULDER - 2+ VIEW

[shoulder axial]
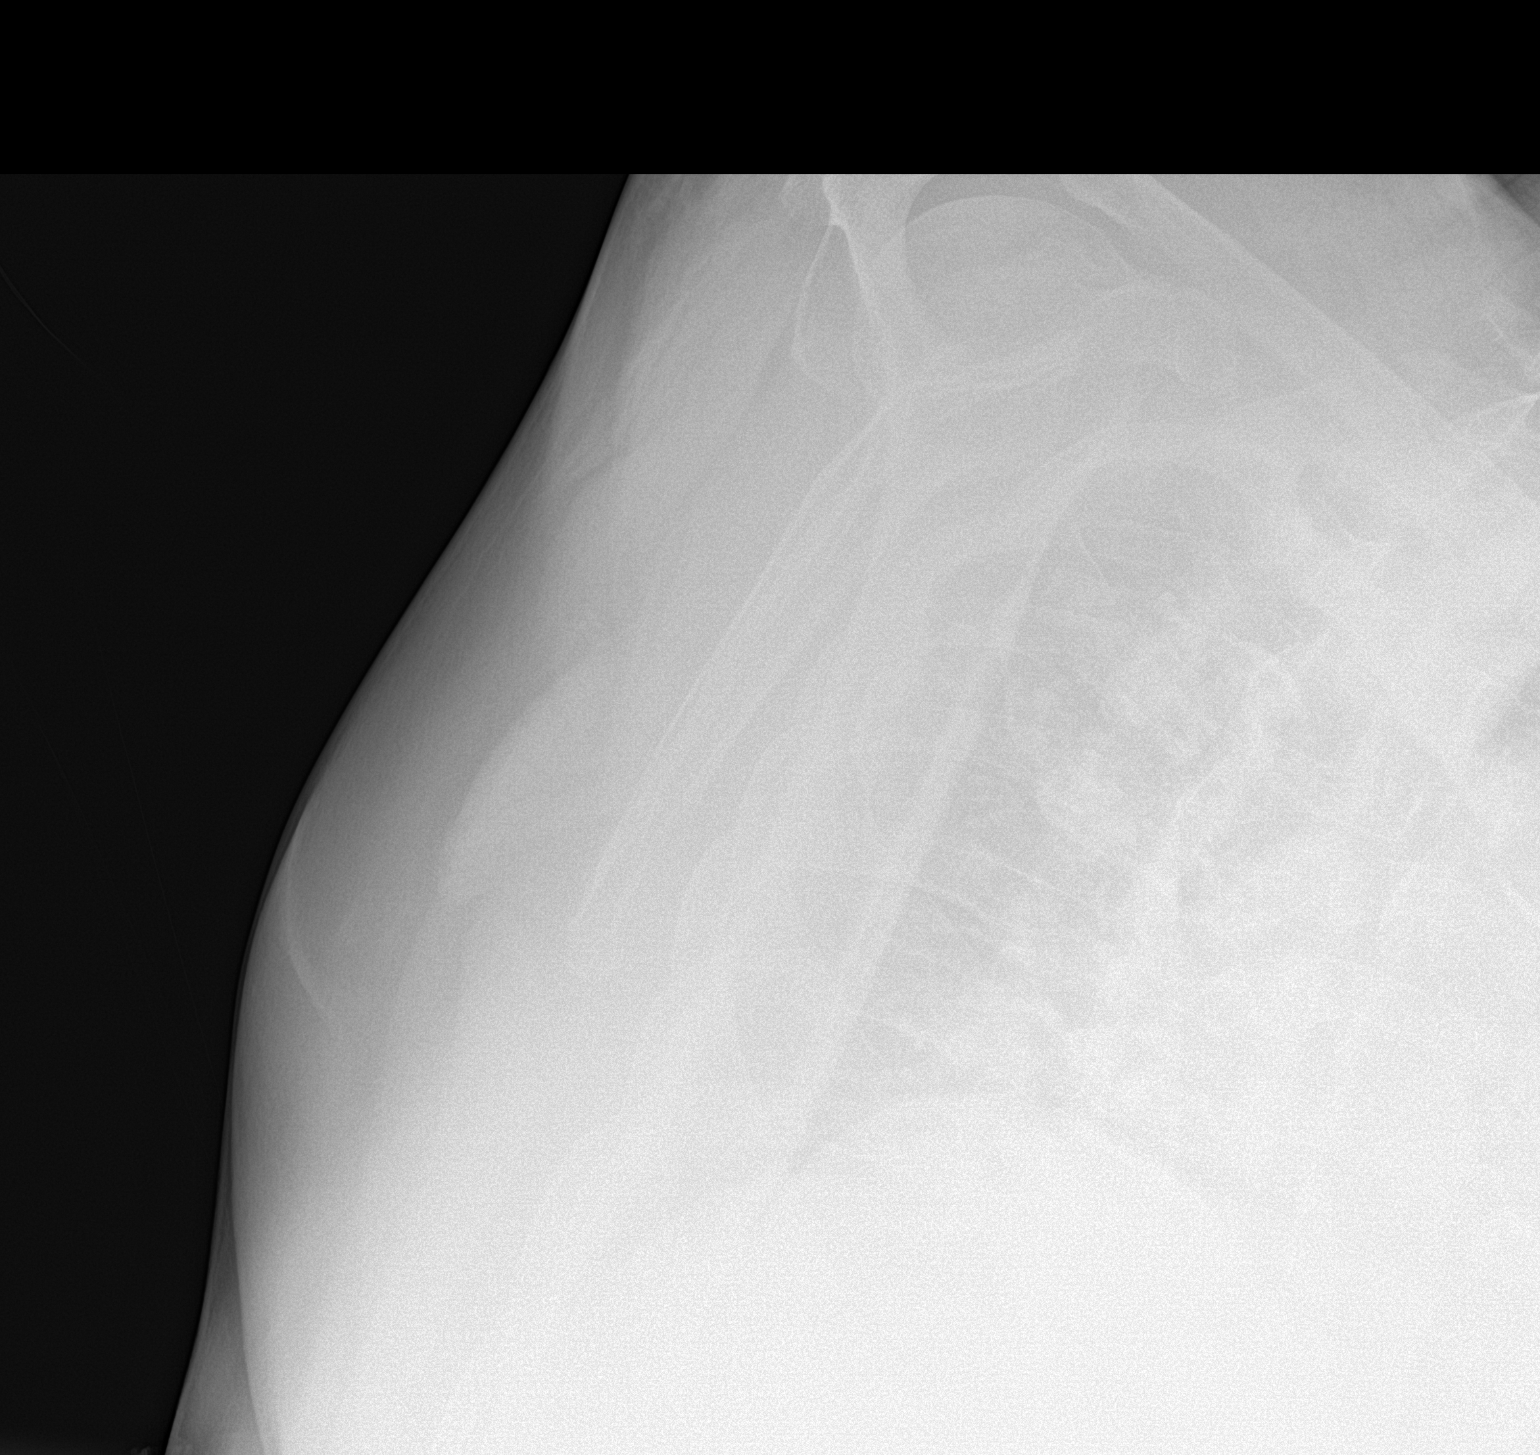

[shoulder ap]
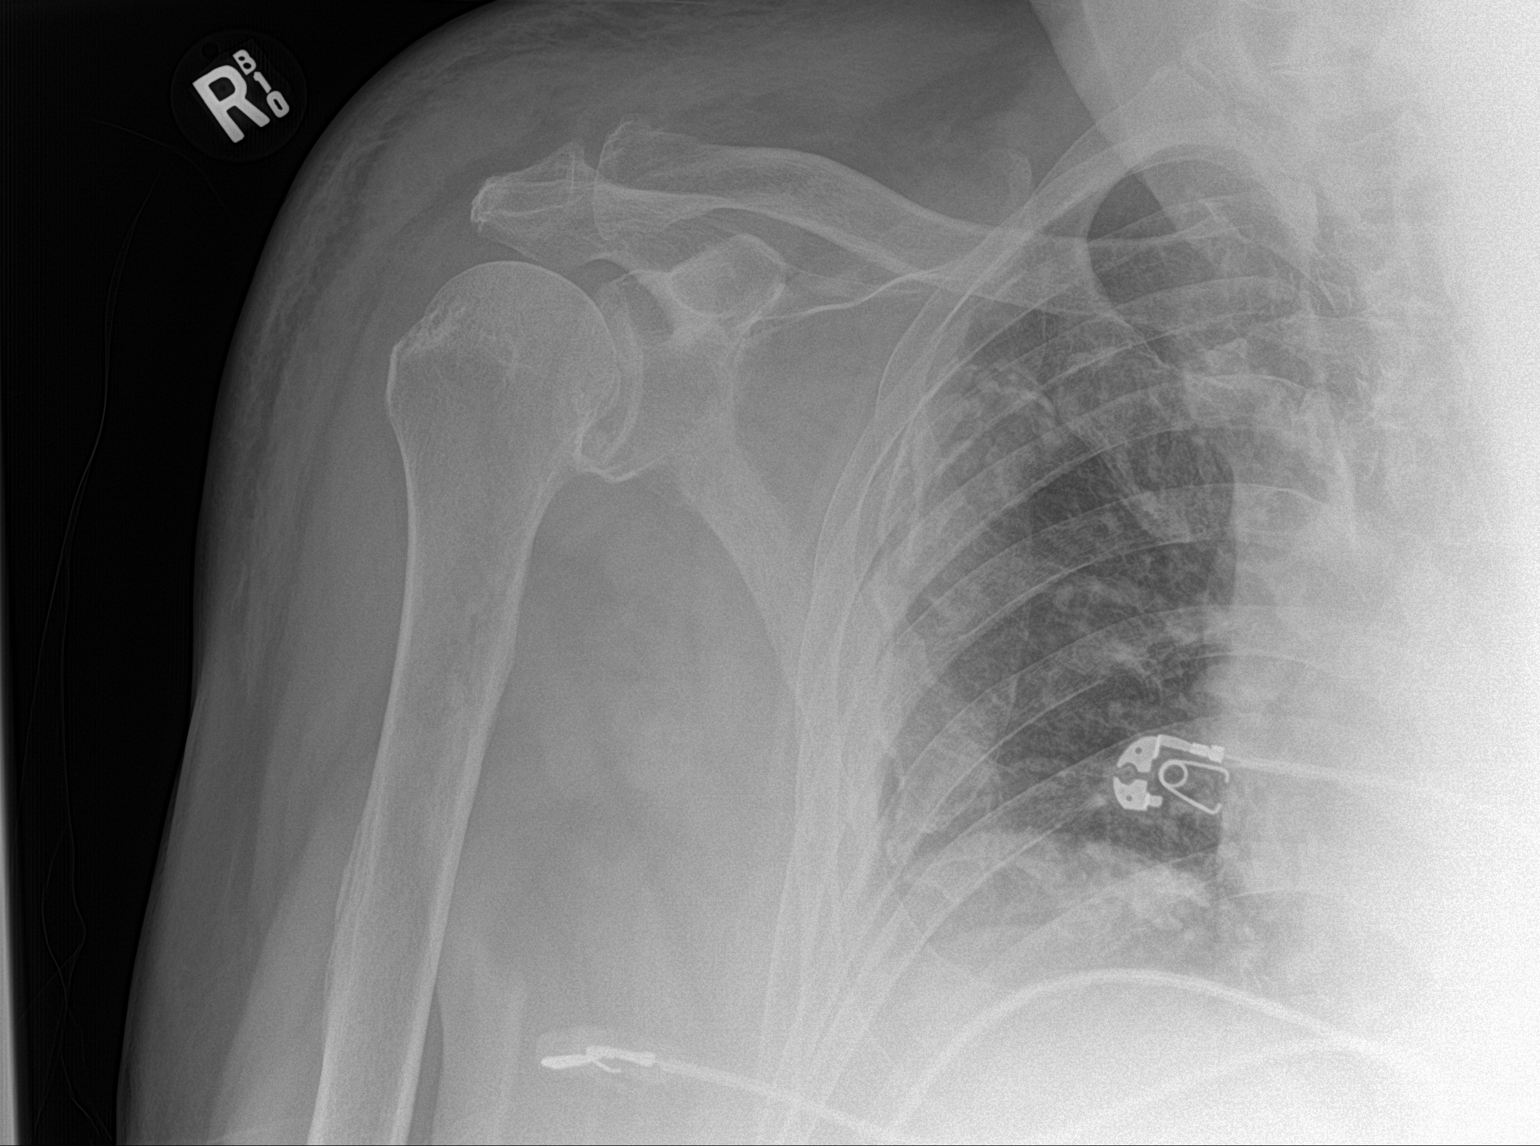

[2 of 2 positions shown; findings below may reference images not displayed]

FINDINGS: Frontal and Y scapular images were obtained. There is no acute
fracture or dislocation. There is moderate osteoarthritic change in
the glenohumeral joint. There is mild osteoarthritic change in the
acromioclavicular joint. There is mild bony overgrowth along the
distal clavicle.

There is calcified pleural plaque on the right.
IMPRESSION: No acute fracture or dislocation. Moderate osteoarthritic change in
the glenohumeral joint with a lesser degree of osteoarthritic change
in the acromioclavicular joint. Calcified pleural plaque on the
right.

## 2019-02-11 ENCOUNTER — Other Ambulatory Visit: Payer: Self-pay | Admitting: Internal Medicine

## 2019-02-11 MED ORDER — LORAZEPAM 1 MG PO TABS: 1.0000 mg | ORAL_TABLET | Freq: Three times a day (TID) | ORAL | 1 refills | Status: DC | PRN

## 2019-02-24 DIAGNOSIS — I739 Peripheral vascular disease, unspecified: Secondary | ICD-10-CM | POA: Diagnosis not present

## 2019-02-24 DIAGNOSIS — M79671 Pain in right foot: Secondary | ICD-10-CM | POA: Diagnosis not present

## 2019-02-24 DIAGNOSIS — M79672 Pain in left foot: Secondary | ICD-10-CM | POA: Diagnosis not present

## 2019-03-04 ENCOUNTER — Other Ambulatory Visit: Payer: Self-pay | Admitting: Cardiovascular Disease

## 2019-03-17 ENCOUNTER — Ambulatory Visit
Admission: RE | Admit: 2019-03-17 | Discharge: 2019-03-17 | Disposition: A | Discharge status: Home / Self Care | Payer: Medicare Other | Source: Ambulatory Visit | Attending: Family Medicine | Admitting: Family Medicine

## 2019-03-17 ENCOUNTER — Other Ambulatory Visit: Payer: Self-pay

## 2019-03-17 DIAGNOSIS — R296 Repeated falls: Secondary | ICD-10-CM

## 2019-03-17 DIAGNOSIS — M5136 Other intervertebral disc degeneration, lumbar region: Secondary | ICD-10-CM | POA: Diagnosis not present

## 2019-03-17 DIAGNOSIS — M21379 Foot drop, unspecified foot: Secondary | ICD-10-CM

## 2019-03-24 ENCOUNTER — Encounter: Payer: Self-pay | Admitting: Internal Medicine

## 2019-03-24 DIAGNOSIS — S68611A Complete traumatic transphalangeal amputation of left index finger, initial encounter: Secondary | ICD-10-CM | POA: Diagnosis not present

## 2019-03-24 DIAGNOSIS — Z4789 Encounter for other orthopedic aftercare: Secondary | ICD-10-CM | POA: Diagnosis not present

## 2019-03-25 DIAGNOSIS — M5416 Radiculopathy, lumbar region: Secondary | ICD-10-CM | POA: Diagnosis not present

## 2019-04-07 ENCOUNTER — Ambulatory Visit (INDEPENDENT_AMBULATORY_CARE_PROVIDER_SITE_OTHER): Payer: Medicare Other | Admitting: Internal Medicine

## 2019-04-07 ENCOUNTER — Encounter: Payer: Self-pay | Admitting: Internal Medicine

## 2019-04-07 ENCOUNTER — Other Ambulatory Visit: Payer: Self-pay

## 2019-04-07 VITALS — BP 185/85 | HR 71 | Temp 97.5°F | Ht 68.0 in | Wt 241.6 lb

## 2019-04-07 DIAGNOSIS — K648 Other hemorrhoids: Secondary | ICD-10-CM
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
## 2019-04-07 NOTE — Patient Instructions (Addendum)
Avoid straining.  Continue  Benefiber 2 teaspoons twice daily  Limit toilet time to 5 minutes  Call with any interim problems  Continue esomeprazole 40 mg daily for GERD  Schedule followup appointment in 3 months from now

## 2019-04-08 ENCOUNTER — Telehealth: Payer: Self-pay

## 2019-04-08 ENCOUNTER — Telehealth: Payer: Self-pay | Admitting: *Deleted

## 2019-04-08 NOTE — Telephone Encounter (Signed)
Pt called with c/o extreme pressure to the rectum s/p hemorrhoid banding yesterday 04/08/19. When asked was he having pain, pt states that he has so much pressure that he feels that it's hurting. Pt applied ice last night and slept with no problems. Please advise if there is anything else pt can do. Pt is aware that he should start feeling better in the next 1-2 days.

## 2019-04-08 NOTE — Telephone Encounter (Signed)
   Primary Cardiologist:  Kate Sable, MD   Patient contacted.  History reviewed.  No symptoms to suggest any unstable cardiac conditions.  Based on discussion, with current pandemic situation, we will be postponing this appointment for Julian Fowler with a plan for f/u in September 2020 or sooner if feasible/necessary.  If symptoms change, he has been instructed to contact our office.     Marlou Sa, RN  04/08/2019 3:13 PM         .

## 2019-04-08 NOTE — Telephone Encounter (Signed)
Spoke with pt and spouse. They have some of the hemorrhoid cream with Lidocaine. They don't want me to send a new RX in to [place] yet. Pt will apply cream as directed 4 times daily and call back if he's no better.

## 2019-04-08 NOTE — Telephone Encounter (Signed)
Pressure sensation is a good thing as far as taking care of the hemorrhoids but do not want too much to cause pain. It will subside.  Would offer [facility] hemorrhoid cream-add Xylocaine.  Apply pea-sized amount to the anorectum dentally 4 times a day.  No refills.

## 2019-04-21 ENCOUNTER — Ambulatory Visit: Payer: Medicare Other | Admitting: Cardiovascular Disease

## 2019-04-23 DIAGNOSIS — Z1159 Encounter for screening for other viral diseases: Secondary | ICD-10-CM | POA: Diagnosis not present

## 2019-04-29 DIAGNOSIS — M5116 Intervertebral disc disorders with radiculopathy, lumbar region: Secondary | ICD-10-CM | POA: Diagnosis not present

## 2019-04-29 DIAGNOSIS — M5126 Other intervertebral disc displacement, lumbar region: Secondary | ICD-10-CM | POA: Diagnosis not present

## 2019-04-29 DIAGNOSIS — M4726 Other spondylosis with radiculopathy, lumbar region: Secondary | ICD-10-CM | POA: Diagnosis not present

## 2019-04-29 DIAGNOSIS — M5416 Radiculopathy, lumbar region: Secondary | ICD-10-CM | POA: Diagnosis not present

## 2019-04-29 DIAGNOSIS — M47816 Spondylosis without myelopathy or radiculopathy, lumbar region: Secondary | ICD-10-CM | POA: Diagnosis not present

## 2019-05-21 ENCOUNTER — Other Ambulatory Visit (HOSPITAL_COMMUNITY): Payer: Self-pay | Admitting: Neurosurgery

## 2019-05-21 ENCOUNTER — Ambulatory Visit (HOSPITAL_COMMUNITY)
Admission: RE | Admit: 2019-05-21 | Discharge: 2019-05-21 | Disposition: A | Discharge status: Home / Self Care | Payer: Medicare Other | Source: Ambulatory Visit | Attending: Family | Admitting: Family

## 2019-05-21 ENCOUNTER — Other Ambulatory Visit: Payer: Self-pay

## 2019-05-21 DIAGNOSIS — Z9889 Other specified postprocedural states: Secondary | ICD-10-CM

## 2019-05-21 DIAGNOSIS — M79605 Pain in left leg: Secondary | ICD-10-CM

## 2019-05-21 DIAGNOSIS — M79604 Pain in right leg: Secondary | ICD-10-CM | POA: Diagnosis not present

## 2019-05-23 ENCOUNTER — Other Ambulatory Visit: Payer: Self-pay | Admitting: Cardiovascular Disease

## 2019-05-28 DIAGNOSIS — M79672 Pain in left foot: Secondary | ICD-10-CM | POA: Diagnosis not present

## 2019-05-28 DIAGNOSIS — L600 Ingrowing nail: Secondary | ICD-10-CM | POA: Diagnosis not present

## 2019-05-28 DIAGNOSIS — L110 Acquired keratosis follicularis: Secondary | ICD-10-CM | POA: Diagnosis not present

## 2019-05-28 DIAGNOSIS — M79671 Pain in right foot: Secondary | ICD-10-CM | POA: Diagnosis not present

## 2019-05-28 DIAGNOSIS — I739 Peripheral vascular disease, unspecified: Secondary | ICD-10-CM | POA: Diagnosis not present

## 2019-06-02 ENCOUNTER — Other Ambulatory Visit: Payer: Self-pay | Admitting: Cardiovascular Disease

## 2019-06-12 DIAGNOSIS — Z4789 Encounter for other orthopedic aftercare: Secondary | ICD-10-CM | POA: Diagnosis not present

## 2019-06-12 DIAGNOSIS — S68611A Complete traumatic transphalangeal amputation of left index finger, initial encounter: Secondary | ICD-10-CM | POA: Diagnosis not present

## 2019-06-16 ENCOUNTER — Other Ambulatory Visit: Payer: Self-pay | Admitting: Neurosurgery

## 2019-06-16 DIAGNOSIS — M5416 Radiculopathy, lumbar region: Secondary | ICD-10-CM

## 2019-06-18 ENCOUNTER — Telehealth: Payer: Self-pay | Admitting: Cardiovascular Disease

## 2019-06-18 MED ORDER — POTASSIUM CHLORIDE CRYS ER 20 MEQ PO TBCR: 20.0000 meq | EXTENDED_RELEASE_TABLET | Freq: Two times a day (BID) | ORAL | 0 refills | Status: DC

## 2019-06-18 NOTE — Telephone Encounter (Signed)
Patient walked in -   Questioning about dose of potassium- he has been taking two a day. When he had in filled in May it stated one a day, that he did not know he was suppose to just take one.   He didn't realize this until he just tried to get it refilled.  He is completely out.  potassium chloride SA (K-DUR) 20 MEQ tablet  [facility] [place]

## 2019-06-18 NOTE — Telephone Encounter (Signed)
Ok to refill potassium 51meq bid? Pt has f/u 9/28

## 2019-06-18 NOTE — Telephone Encounter (Signed)
Yes

## 2019-06-18 NOTE — Telephone Encounter (Signed)
Medication sent to pharmacy  

## 2019-06-23 ENCOUNTER — Other Ambulatory Visit: Payer: Self-pay

## 2019-06-23 ENCOUNTER — Telehealth: Payer: Self-pay | Admitting: Cardiovascular Disease

## 2019-06-23 ENCOUNTER — Ambulatory Visit (HOSPITAL_COMMUNITY)
Admission: RE | Admit: 2019-06-23 | Discharge: 2019-06-23 | Disposition: A | Discharge status: Home / Self Care | Payer: Medicare Other | Source: Ambulatory Visit | Attending: Internal Medicine | Admitting: Internal Medicine

## 2019-06-23 ENCOUNTER — Ambulatory Visit (HOSPITAL_COMMUNITY): Payer: Medicare Other

## 2019-06-23 DIAGNOSIS — R918 Other nonspecific abnormal finding of lung field: Secondary | ICD-10-CM | POA: Diagnosis not present

## 2019-06-23 DIAGNOSIS — J849 Interstitial pulmonary disease, unspecified: Secondary | ICD-10-CM | POA: Insufficient documentation

## 2019-06-23 NOTE — Telephone Encounter (Signed)
Noted and no contact as of yet

## 2019-06-23 NOTE — Telephone Encounter (Signed)
WIFE called to let us know that Dr Janee Morn office should be getting in contact with office in reference to recent test

## 2019-06-24 ENCOUNTER — Other Ambulatory Visit: Payer: Self-pay

## 2019-06-24 ENCOUNTER — Ambulatory Visit
Admission: RE | Admit: 2019-06-24 | Discharge: 2019-06-24 | Disposition: A | Discharge status: Home / Self Care | Payer: Medicare Other | Source: Ambulatory Visit | Attending: Neurosurgery | Admitting: Neurosurgery

## 2019-06-24 DIAGNOSIS — M5117 Intervertebral disc disorders with radiculopathy, lumbosacral region: Secondary | ICD-10-CM | POA: Diagnosis not present

## 2019-06-24 DIAGNOSIS — M5416 Radiculopathy, lumbar region: Secondary | ICD-10-CM

## 2019-06-24 MED ORDER — GADOBENATE DIMEGLUMINE 529 MG/ML IV SOLN
19.0000 mL | Freq: Once | INTRAVENOUS | Status: AC | PRN
  Administered 2019-06-24: 13:00:00 19 mL via INTRAVENOUS

## 2019-06-25 ENCOUNTER — Ambulatory Visit (INDEPENDENT_AMBULATORY_CARE_PROVIDER_SITE_OTHER): Payer: Medicare Other | Admitting: Internal Medicine

## 2019-06-25 ENCOUNTER — Other Ambulatory Visit: Payer: Self-pay

## 2019-06-25 ENCOUNTER — Encounter: Payer: Self-pay | Admitting: Internal Medicine

## 2019-06-25 DIAGNOSIS — J61 Pneumoconiosis due to asbestos and other mineral fibers: Secondary | ICD-10-CM

## 2019-06-25 DIAGNOSIS — G4733 Obstructive sleep apnea (adult) (pediatric): Secondary | ICD-10-CM | POA: Diagnosis not present

## 2019-06-25 DIAGNOSIS — Z23 Encounter for immunization: Secondary | ICD-10-CM | POA: Diagnosis not present

## 2019-06-25 DIAGNOSIS — J4520 Mild intermittent asthma, uncomplicated: Secondary | ICD-10-CM

## 2019-06-25 DIAGNOSIS — J302 Other seasonal allergic rhinitis: Secondary | ICD-10-CM

## 2019-06-25 DIAGNOSIS — J3089 Other allergic rhinitis: Secondary | ICD-10-CM

## 2019-06-25 DIAGNOSIS — J849 Interstitial pulmonary disease, unspecified: Secondary | ICD-10-CM | POA: Diagnosis not present

## 2019-06-25 NOTE — Assessment & Plan Note (Signed)
Specifically has only pleural plaques, not interstitial disease. No evidence of progressive process.

## 2019-06-25 NOTE — Assessment & Plan Note (Signed)
Minimal and uncomplicated. Not needing bronchodilators currently. He knows to contact us for problems.

## 2019-06-25 NOTE — Patient Instructions (Addendum)
Order- flu vax senior  Have fun at the beach  Please call if we can help

## 2019-06-25 NOTE — Assessment & Plan Note (Signed)
Caution against weight gain. Wife here and she denies hearing him snore much at all.

## 2019-06-25 NOTE — Assessment & Plan Note (Signed)
Current CT does not see findings consistent with the concerns described 11/02/18. No active or progressive interstitial process.  Plan- flu vax
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
## 2019-06-25 NOTE — Assessment & Plan Note (Signed)
Discussed use of antihistamines

## 2019-07-06 DIAGNOSIS — H02054 Trichiasis without entropian left upper eyelid: Secondary | ICD-10-CM | POA: Diagnosis not present

## 2019-07-06 DIAGNOSIS — H11823 Conjunctivochalasis, bilateral: Secondary | ICD-10-CM | POA: Diagnosis not present

## 2019-07-06 DIAGNOSIS — H401121 Primary open-angle glaucoma, left eye, mild stage: Secondary | ICD-10-CM | POA: Diagnosis not present

## 2019-07-06 DIAGNOSIS — H401111 Primary open-angle glaucoma, right eye, mild stage: Secondary | ICD-10-CM | POA: Diagnosis not present

## 2019-07-06 DIAGNOSIS — H16421 Pannus (corneal), right eye: Secondary | ICD-10-CM | POA: Diagnosis not present

## 2019-07-06 DIAGNOSIS — H11153 Pinguecula, bilateral: Secondary | ICD-10-CM | POA: Diagnosis not present

## 2019-07-13 ENCOUNTER — Other Ambulatory Visit: Payer: Self-pay

## 2019-07-13 ENCOUNTER — Ambulatory Visit (INDEPENDENT_AMBULATORY_CARE_PROVIDER_SITE_OTHER): Payer: Medicare Other | Admitting: Cardiovascular Disease

## 2019-07-13 ENCOUNTER — Encounter: Payer: Self-pay | Admitting: Cardiovascular Disease

## 2019-07-13 VITALS — BP 175/68 | HR 68 | Ht 69.5 in | Wt 244.8 lb

## 2019-07-13 DIAGNOSIS — I2510 Atherosclerotic heart disease of native coronary artery without angina pectoris: Secondary | ICD-10-CM

## 2019-07-13 DIAGNOSIS — E7800 Pure hypercholesterolemia, unspecified: Secondary | ICD-10-CM

## 2019-07-13 DIAGNOSIS — I471 Supraventricular tachycardia, unspecified: Secondary | ICD-10-CM

## 2019-07-13 DIAGNOSIS — I10 Essential (primary) hypertension: Secondary | ICD-10-CM | POA: Diagnosis not present

## 2019-07-13 DIAGNOSIS — I350 Nonrheumatic aortic (valve) stenosis: Secondary | ICD-10-CM | POA: Diagnosis not present

## 2019-07-13 MED ORDER — HYDRALAZINE HCL 50 MG PO TABS: 50.0000 mg | ORAL_TABLET | Freq: Three times a day (TID) | ORAL | 6 refills | Status: DC

## 2019-07-13 NOTE — Progress Notes (Signed)
SUBJECTIVE: The patient presents for routine follow-up.  He has PSVT and hypertension.  He also has mild aortic stenosis.  Echocardiogram performed in March 2019 demonstrated normal left ventricular systolic function and regional wall motion, LVEF 60-65%, moderate LVH, grade 1 diastolic dysfunction, and probably mild calcific aortic stenosis.  He had a fall in January 2020.  He got into an accident and lost his left index finger in March 2020.  He underwent back surgery in July.  He has been dealing with right leg sciatic pain.  He also says his right knee gives way when he walks.  He denies exertional chest pain and dyspnea.  I reviewed the chest CT dated 06/23/2019 which demonstrated three-vessel coronary calcifications.  He said his blood pressure stays elevated.    Review of Systems: As per "subjective", otherwise negative.  Allergies  Allergen Reactions  . Prednisone     Pt is high functioning and out of sorts.  . Codeine Other (See Comments)    REACTION: groggy  . Tape Other (See Comments)    Skin tears, use paper tape    Current Outpatient Medications  Medication Sig Dispense Refill  . acetaminophen (TYLENOL) 325 MG tablet Take 325 mg by mouth every 6 (six) hours as needed for pain.     Marland Kitchen albuterol (PROAIR HFA) 108 (90 Base) MCG/ACT inhaler Inhale 2 puffs into the lungs every 6 (six) hours as needed for wheezing or shortness of breath. 18 g 12  . aspirin EC 81 MG tablet Take 81 mg by mouth daily.    Marland Kitchen azelastine (ASTELIN) 0.1 % nasal spray 1-2 puffs each nostril every 8 hours if needed for drainage 30 mL 12  . Calcium Carb-Vit D-C-E-Mineral (OS-CAL ULTRA) 600 MG TABS Take 1 tablet by mouth daily.     Marland Kitchen diltiazem (CARDIZEM CD) 120 MG 24 hr capsule Take 1 capsule by mouth twice daily 180 capsule 0  . esomeprazole (NEXIUM) 40 MG capsule Take 40 mg by mouth every morning.     . fexofenadine (ALLEGRA) 180 MG tablet Take 180 mg by mouth daily.    . fluticasone (FLONASE)  50 MCG/ACT nasal spray Place 2 sprays into both nostrils daily. (Patient taking differently: Place 2 sprays into both nostrils daily as needed for allergies. ) 18.2 g 12  . fluticasone furoate-vilanterol (BREO ELLIPTA) 100-25 MCG/INH AEPB Inhale 1 puff into the lungs daily. 2 each 0  . furosemide (LASIX) 20 MG tablet Take 1 tablet (20 mg total) by mouth as needed. (Patient taking differently: Take 20 mg by mouth as needed. 9/10 pt reports taking 20 mg once or twice weekly)    . hydrALAZINE (APRESOLINE) 25 MG tablet TAKE 1 TABLET BY MOUTH THREE TIMES DAILY 270 tablet 0  . latanoprost (XALATAN) 0.005 % ophthalmic solution Place 1 drop into both eyes at bedtime.     Marland Kitchen LORazepam (ATIVAN) 1 MG tablet Take 1 tablet (1 mg total) by mouth 3 (three) times daily as needed. 180 tablet 1  . losartan (COZAAR) 100 MG tablet Take 100 mg by mouth every morning.     . Multiple Vitamins-Minerals (CENTRUM SILVER PO) Take 1 tablet by mouth daily.     . polyethylene glycol (MIRALAX / GLYCOLAX) packet Take 17 g by mouth daily.    . potassium chloride SA (K-DUR) 20 MEQ tablet Take 1 tablet (20 mEq total) by mouth 2 (two) times daily. 180 tablet 0  . pravastatin (PRAVACHOL) 40 MG tablet Take 1 tablet  by mouth at bedtime.     . sildenafil (REVATIO) 20 MG tablet Take 1-2 tablets by mouth as needed.  1  . traMADol (ULTRAM) 50 MG tablet Take 1 tablet by mouth 2 (two) times daily as needed.    . Wheat Dextrin (BENEFIBER) POWD Take by mouth 2 (two) times daily. Takes 1 Tbsp twice daily    . zolpidem (AMBIEN) 10 MG tablet Take 1 tablet by mouth at bedtime.      No current facility-administered medications for this visit.     Past Medical History:  Diagnosis Date  . Abdominal aorta injury    Normal size, ultrasound, March 17,2011  . Allergic rhinitis   . Arthritis   . Asbestos exposure Jan 2001   Hx of asbestos related pleural plaques  . Balance problem   . BPH (benign prostatic hyperplasia)   . Complication of  anesthesia    "trouble waking up"  . Easy bruisability   . ED (erectile dysfunction)   . Ejection fraction    60%, echo, 2008, mild RV dysfunction  . GERD (gastroesophageal reflux disease)   . H/O hiatal hernia   . Hearing loss in left ear   . History of shingles    "mild"  . Hyperlipidemia    under control  . Hypertension December 29, 2009   renal artery Doppler , normal  12/2009  . Overactive bladder   . Sleep apnea    was dx 10 years ago reports lost a lot of weight and no longer machine  . Tinnitus     Past Surgical History:  Procedure Laterality Date  . BACK SURGERY  2014   herniated L1, L2   Dr Carloyn Manner  . BRAIN SURGERY    . CEREBRAL EMBOLIZATION  12/2011   "radiation therapy-did not work"  . CHOLECYSTECTOMY  6/98  . COLONOSCOPY  09/2009   Dr. Lindalou Hose: normal, internal hemorrhoids   . COLONOSCOPY N/A 02/28/2017   Dr. Gala Romney: Hemorrhoids, grade 3, mild diverticulosis.  Marland Kitchen CRANIECTOMY FOR EXCISION OF ACOUSTIC NEUROMA  3/95  . MINOR AMPUTATION OF DIGIT Left 12/31/2018   Procedure: REVISION AMPUTATION OF LEFT INDEX FINGER, IRRIGATION AND DEBRIDEMENT LEFT INDEX FINGER;  Surgeon: Verner Mould, MD;  Location: [place];  Service: Orthopedics;  Laterality: Left;  . RADIOLOGY WITH ANESTHESIA N/A 07/07/2014   Procedure: EMBOLIZATION;  Surgeon: Rob Hickman, MD;  Location: [place];  Service: Radiology;  Laterality: N/A;  . TOTAL KNEE ARTHROPLASTY Left 07/06/2013   Procedure: LEFT TOTAL KNEE ARTHROPLASTY;  Surgeon: Gearlean Alf, MD;  Location: [facility];  Service: Orthopedics;  Laterality: Left;  . TOTAL KNEE ARTHROPLASTY Right 01/18/2014   Procedure: RIGHT TOTAL KNEE ARTHROPLASTY;  Surgeon: Gearlean Alf, MD;  Location: [facility];  Service: Orthopedics;  Laterality: Right;  . TRIGGER FINGER RELEASE  2003   (thumb) middle finger (2006)    Social History   Socioeconomic History  . Marital status: Married    Spouse name: Not on file  . Number of children: Not on file  . Years of  education: Not on file  . Highest education level: Not on file  Occupational History  . Not on file  Social Needs  . Financial resource strain: Not on file  . Food insecurity    Worry: Not on file    Inability: Not on file  . Transportation needs    Medical: Not on file    Non-medical: Not on file  Tobacco Use  . Smoking  status: Former Smoker    Packs/day: 1.50    Years: 30.00    Pack years: 45.00    Types: Cigarettes    Start date: 04/24/1957    Quit date: 10/15/1986    Years since quitting: 32.7  . Smokeless tobacco: Never Used  Substance and Sexual Activity  . Alcohol use: No    Alcohol/week: 0.0 standard drinks    Comment: Used to drink heavily at times  . Drug use: No  . Sexual activity: Not on file  Lifestyle  . Physical activity    Days per week: Not on file    Minutes per session: Not on file  . Stress: Not on file  Relationships  . Social Herbalist on phone: Not on file    Gets together: Not on file    Attends religious service: Not on file    Active member of club or organization: Not on file    Attends meetings of clubs or organizations: Not on file    Relationship status: Not on file  . Intimate partner violence    Fear of current or ex partner: Not on file    Emotionally abused: Not on file    Physically abused: Not on file    Forced sexual activity: Not on file  Other Topics Concern  . Not on file  Social History Narrative   Co-dependent relationship with his 11 year old son who has drug and financial problems   Recently married to [place] on 01/02/2014     Vitals:   07/13/19 1454  BP: (!) 175/68  Pulse: 68  SpO2: 97%  Weight: 244 lb 12.8 oz (111 kg)  Height: 5' 9.5" (1.765 m)    Wt Readings from Last 3 Encounters:  07/13/19 244 lb 12.8 oz (111 kg)  06/25/19 238 lb 6.4 oz (108.1 kg)  04/07/19 241 lb 9.6 oz (109.6 kg)     PHYSICAL EXAM General: NAD HEENT: Normal. Neck: No JVD, no thyromegaly. Lungs: Clear to auscultation  bilaterally with normal respiratory effort. CV: Regular rate and rhythm, normal S1/S2, no [id number], 2/6 systolic murmur over right upper sternal border.  Trace right leg edema.  No carotid bruit.   Abdomen: Soft, nontender, no distention.  Neurologic: Alert and oriented.  Psych: Normal affect. Skin: Normal. Musculoskeletal: No gross deformities.      Labs: Lab Results  Component Value Date/Time   K 3.8 12/31/2018 12:40 AM   BUN 14 12/31/2018 12:40 AM   CREATININE 0.69 12/31/2018 12:40 AM   CREATININE 1.00 12/18/2017 10:44 AM   ALT 25 11/02/2018 07:35 AM   TSH 1.617 12/16/2007 10:38 PM   HGB 13.0 12/31/2018 12:40 AM     Lipids: Lab Results  Component Value Date/Time   LDLCALC 112 (H) 01/06/2009 10:52 PM   CHOL 182 01/06/2009 10:52 PM   TRIG 128 01/06/2009 10:52 PM   HDL 44 01/06/2009 10:52 PM       ASSESSMENT AND PLAN: 1.  PSVT: Symptomatically stable on long-acting diltiazem 120 mg twice daily.  No changes.  2.  Hypertension: Blood pressure is elevated.  I will increase hydralazine to 50 mg 3 times daily.  3.  Aortic stenosis: Mild in severity by echocardiogram in March 2019.  I will monitor.  4.  Three-vessel coronary calcifications: He denies anginal symptoms.  No indication for stress testing at this time.  5.  Hyperlipidemia: Continue pravastatin 40 mg.   Disposition: Follow up 1 yr   Kate Sable, M.D.,  F.A.C.C. 

## 2019-07-13 NOTE — Patient Instructions (Signed)
Medication Instructions:   Increase Hydralazine to 50mg  three times per day.   Continue all current medications.  Labwork: none  Testing/Procedures: none  Follow-Up: Your physician wants you to follow up in:  1 year.  You will receive a reminder letter in the mail one-two months in advance.  If you don't receive a letter, please call our office to schedule the follow up appointment   Any Other Special Instructions Will Be Listed Below (If Applicable).  If you need a refill on your cardiac medications before your next appointment, please call your pharmacy.

## 2019-07-15 ENCOUNTER — Encounter: Payer: Self-pay | Admitting: Internal Medicine

## 2019-07-15 ENCOUNTER — Other Ambulatory Visit: Payer: Medicare Other

## 2019-07-17 ENCOUNTER — Other Ambulatory Visit: Payer: Self-pay | Admitting: Podiatry

## 2019-07-17 ENCOUNTER — Other Ambulatory Visit: Payer: Self-pay

## 2019-07-17 ENCOUNTER — Ambulatory Visit (INDEPENDENT_AMBULATORY_CARE_PROVIDER_SITE_OTHER): Payer: Medicare Other

## 2019-07-17 ENCOUNTER — Encounter: Payer: Self-pay | Admitting: Podiatry

## 2019-07-17 ENCOUNTER — Ambulatory Visit (INDEPENDENT_AMBULATORY_CARE_PROVIDER_SITE_OTHER): Payer: Medicare Other | Admitting: Podiatry

## 2019-07-17 VITALS — BP 157/74 | HR 65 | Resp 16

## 2019-07-17 DIAGNOSIS — M79671 Pain in right foot: Secondary | ICD-10-CM

## 2019-07-17 DIAGNOSIS — M21371 Foot drop, right foot: Secondary | ICD-10-CM

## 2019-07-17 DIAGNOSIS — I2510 Atherosclerotic heart disease of native coronary artery without angina pectoris: Secondary | ICD-10-CM

## 2019-07-17 NOTE — Progress Notes (Signed)
Subjective:  Patient ID: Julian Fowler, male    DOB: 1935-07-24,  MRN: [id number]  Chief Complaint  Patient presents with  . Foot Pain    Right foot; Dorsal; swelling; pt stated, "In Jan. 7, 2019, was diagnosed with Cellulitis; swelling has never gone down; has gait instability; has seen a vein specialist; has had ultrsounds done; has small veins in legs"; x1+ yrs    84 y.o. male presents with the above complaint. Hx confirmed with patient.  Patient states his primary complaint is right foot drop.  He states that he has fallen multiple times over the years and his last acute event he shoulder bone that was broken.  He also complains of right dorsal swelling of unknown origin.  Review of Systems: Negative except as noted in the HPI. Denies N/V/F/Ch.  Past Medical History:  Diagnosis Date  . Abdominal aorta injury    Normal size, ultrasound, March 17,2011  . Allergic rhinitis   . Arthritis   . Asbestos exposure Jan 2001   Hx of asbestos related pleural plaques  . Balance problem   . BPH (benign prostatic hyperplasia)   . Complication of anesthesia    "trouble waking up"  . Easy bruisability   . ED (erectile dysfunction)   . Ejection fraction    60%, echo, 2008, mild RV dysfunction  . GERD (gastroesophageal reflux disease)   . H/O hiatal hernia   . Hearing loss in left ear   . History of shingles    "mild"  . Hyperlipidemia    under control  . Hypertension December 29, 2009   renal artery Doppler , normal  12/2009  . Overactive bladder   . Sleep apnea    was dx 10 years ago reports lost a lot of weight and no longer machine  . Tinnitus     Current Outpatient Medications:  .  acetaminophen (TYLENOL) 325 MG tablet, Take 325 mg by mouth every 6 (six) hours as needed for pain. , Disp: , Rfl:  .  albuterol (PROAIR HFA) 108 (90 Base) MCG/ACT inhaler, Inhale 2 puffs into the lungs every 6 (six) hours as needed for wheezing or shortness of breath., Disp: 18 g, Rfl: 12 .  aspirin EC  81 MG tablet, Take 81 mg by mouth daily., Disp: , Rfl:  .  azelastine (ASTELIN) 0.1 % nasal spray, 1-2 puffs each nostril every 8 hours if needed for drainage, Disp: 30 mL, Rfl: 12 .  Calcium Carb-Vit D-C-E-Mineral (OS-CAL ULTRA) 600 MG TABS, Take 1 tablet by mouth daily. , Disp: , Rfl:  .  diltiazem (CARDIZEM CD) 120 MG 24 hr capsule, Take 1 capsule by mouth twice daily, Disp: 180 capsule, Rfl: 0 .  esomeprazole (NEXIUM) 40 MG capsule, Take 40 mg by mouth every morning. , Disp: , Rfl:  .  fexofenadine (ALLEGRA) 180 MG tablet, Take 180 mg by mouth daily., Disp: , Rfl:  .  fluticasone (FLONASE) 50 MCG/ACT nasal spray, Place 2 sprays into both nostrils daily. (Patient taking differently: Place 2 sprays into both nostrils daily as needed for allergies. ), Disp: 18.2 g, Rfl: 12 .  fluticasone furoate-vilanterol (BREO ELLIPTA) 100-25 MCG/INH AEPB, Inhale 1 puff into the lungs daily., Disp: 2 each, Rfl: 0 .  hydrALAZINE (APRESOLINE) 50 MG tablet, Take 1 tablet (50 mg total) by mouth 3 (three) times daily., Disp: 90 tablet, Rfl: 6 .  latanoprost (XALATAN) 0.005 % ophthalmic solution, Place 1 drop into both eyes at bedtime. , Disp: , Rfl:  .  LORazepam (ATIVAN) 1 MG tablet, Take 1 tablet (1 mg total) by mouth 3 (three) times daily as needed., Disp: 180 tablet, Rfl: 1 .  losartan (COZAAR) 100 MG tablet, Take 100 mg by mouth every morning. , Disp: , Rfl:  .  Multiple Vitamins-Minerals (CENTRUM SILVER PO), Take 1 tablet by mouth daily. , Disp: , Rfl:  .  polyethylene glycol (MIRALAX / GLYCOLAX) packet, Take 17 g by mouth daily., Disp: , Rfl:  .  potassium chloride SA (K-DUR) 20 MEQ tablet, Take 1 tablet (20 mEq total) by mouth 2 (two) times daily., Disp: 180 tablet, Rfl: 0 .  pravastatin (PRAVACHOL) 40 MG tablet, Take 1 tablet by mouth at bedtime. , Disp: , Rfl:  .  sildenafil (REVATIO) 20 MG tablet, Take 1-2 tablets by mouth as needed., Disp: , Rfl: 1 .  traMADol (ULTRAM) 50 MG tablet, Take 1 tablet by mouth  2 (two) times daily as needed., Disp: , Rfl:  .  Wheat Dextrin (BENEFIBER) POWD, Take by mouth 2 (two) times daily. Takes 1 Tbsp twice daily, Disp: , Rfl:  .  zolpidem (AMBIEN) 10 MG tablet, Take 1 tablet by mouth at bedtime. , Disp: , Rfl:  .  furosemide (LASIX) 20 MG tablet, Take 1 tablet (20 mg total) by mouth as needed. (Patient taking differently: Take 20 mg by mouth as needed. 9/10 pt reports taking 20 mg once or twice weekly), Disp: , Rfl:   Social History   Tobacco Use  Smoking Status Former Smoker  . Packs/day: 1.50  . Years: 30.00  . Pack years: 45.00  . Types: Cigarettes  . Start date: 04/24/1957  . Quit date: 10/15/1986  . Years since quitting: 32.7  Smokeless Tobacco Never Used    Allergies  Allergen Reactions  . Prednisone     Pt is high functioning and out of sorts.  . Codeine Other (See Comments)    REACTION: groggy  . Tape Other (See Comments)    Skin tears, use paper tape   Objective:   Vitals:   07/17/19 1227  BP: (!) 157/74  Pulse: 65  Resp: 16   There is no height or weight on file to calculate BMI. Constitutional no acute distress, alert/oriented x3 and appropriate mood and affect  Vascular Dorsalis pedis pulse: weak Posterior tibial pulse: present and  Hair pattern: normal Warmth: no warmth  Neurologic Epicritic sensations intact  Dermatologic  right foot dorsal edema noted generalized in nature.  1+ pitting edema.  No pain on palpation.  Orthopedic:  No pain on palpation.  Patient noted to the right foot.  Biloculated.  He has a mild right foot drop.  During the stance phase of gait patient's extensor muscles are not contracting long enough.    Radiographs: 2 standard views of the right foot.  Increasing soft tissue density noticed on the dorsal aspect of the right foot.  There is no rest fracture or any other bony abnormalities noted.  There is incidental finding of a previous metatarsal fracture which has completely healed in nature in a good  alignment. Assessment:   1. Right foot pain   2. Acquired right foot drop    Plan:  Patient was evaluated and treated and all questions answered.  Right acquired foot drop -I recommended the patient see weight for gait evaluation and to properly assess the right foot drop. -I have also spoke to patient about starting physical therapy to help with general conditioning and strengthening the extensor compartment. -I extensively  spoke to patient about wearing compression stockings with ice and elevation to remove the soft tissue swelling of the right dorsal foot.  However given his age and the amount of cardiac load patient is having difficult time removing the swelling of the right dorsal foot.  -Has been greater than 45 minutes with the patient with at least 50% of face-to-face encounter.  I extensively talked to the patient about right acquired foot drop and generalized soft tissue swelling. No follow-ups on file.

## 2019-07-20 ENCOUNTER — Telehealth: Payer: Self-pay | Admitting: *Deleted

## 2019-07-20 DIAGNOSIS — M79671 Pain in right foot: Secondary | ICD-10-CM

## 2019-07-20 DIAGNOSIS — M21371 Foot drop, right foot: Secondary | ICD-10-CM

## 2019-07-20 NOTE — Telephone Encounter (Signed)
-----   Message from Felipa Furnace, DPM sent at 07/17/2019  1:03 PM EDT ----- Reina Fuse,  Can you set him with physical therapy near where he lives [place]?) This if for Right foot drop and general conditioning.   Thanks  Boneta Lucks

## 2019-07-20 NOTE — Telephone Encounter (Signed)
Faxed orders to [facility].

## 2019-07-22 ENCOUNTER — Other Ambulatory Visit: Payer: Self-pay | Admitting: Cardiovascular Disease

## 2019-07-28 ENCOUNTER — Other Ambulatory Visit: Payer: Self-pay

## 2019-07-28 ENCOUNTER — Ambulatory Visit (INDEPENDENT_AMBULATORY_CARE_PROVIDER_SITE_OTHER): Payer: Medicare Other | Admitting: Urology

## 2019-07-28 DIAGNOSIS — R350 Frequency of micturition: Secondary | ICD-10-CM | POA: Diagnosis not present

## 2019-07-28 DIAGNOSIS — R972 Elevated prostate specific antigen [PSA]: Secondary | ICD-10-CM | POA: Diagnosis not present

## 2019-07-28 DIAGNOSIS — N401 Enlarged prostate with lower urinary tract symptoms: Secondary | ICD-10-CM | POA: Diagnosis not present

## 2019-07-29 ENCOUNTER — Telehealth: Payer: Self-pay | Admitting: Internal Medicine

## 2019-07-29 NOTE — Telephone Encounter (Signed)
FYI Pt was notified of RMR's recommendations. Pt has an apt that was previously scheduled this week with RMR. If pt's symptoms worsen prior to Friday, pt will go to the ED. Pt's spouse has also called his PCP and waiting to hear back from that office. Marland Kitchen

## 2019-07-29 NOTE — Telephone Encounter (Signed)
This could be anything.  If he is having severe pain, he should go to the emergency room.  If not, start with primary care physician.  If they cannot figure it out call us back

## 2019-07-29 NOTE — Telephone Encounter (Signed)
[place] PATIENT, WIFE STATING HE IS IN BAD PAIN

## 2019-07-29 NOTE — Telephone Encounter (Signed)
Communication noted.  

## 2019-07-29 NOTE — Telephone Encounter (Signed)
Spoke with pt. Pt is having abdominal pain which started Saturday 07/24/2020 after eating pizza and salad at a retsurant. Pt states when he eats pizza at the restaurant, he usually comes home and has a bowel movement within 40 mins. Pt had a bowel movement within 30 mins and pain followed after. Pt states that his pain level is a 6-7. Pt saw his urologist yesterday 07/27/2020 and he felt pt is having a diverticulitis flare. No n/v, but pain that is on pts lt side and moves to the middle of his abdomen. Pt is having some discomfort and feel bloated. Pt states that the pain isn't as bad when he is still and laying in the bed.

## 2019-07-30 DIAGNOSIS — E782 Mixed hyperlipidemia: Secondary | ICD-10-CM | POA: Diagnosis not present

## 2019-07-30 DIAGNOSIS — R55 Syncope and collapse: Secondary | ICD-10-CM | POA: Diagnosis not present

## 2019-07-30 DIAGNOSIS — M21371 Foot drop, right foot: Secondary | ICD-10-CM | POA: Diagnosis not present

## 2019-07-30 DIAGNOSIS — I10 Essential (primary) hypertension: Secondary | ICD-10-CM | POA: Diagnosis not present

## 2019-07-30 DIAGNOSIS — E871 Hypo-osmolality and hyponatremia: Secondary | ICD-10-CM | POA: Diagnosis not present

## 2019-07-30 DIAGNOSIS — R1012 Left upper quadrant pain: Secondary | ICD-10-CM | POA: Diagnosis not present

## 2019-07-30 DIAGNOSIS — R600 Localized edema: Secondary | ICD-10-CM | POA: Diagnosis not present

## 2019-07-30 DIAGNOSIS — Z6836 Body mass index (BMI) 36.0-36.9, adult: Secondary | ICD-10-CM | POA: Diagnosis not present

## 2019-07-31 ENCOUNTER — Encounter: Payer: Self-pay | Admitting: Internal Medicine

## 2019-07-31 ENCOUNTER — Ambulatory Visit (INDEPENDENT_AMBULATORY_CARE_PROVIDER_SITE_OTHER): Payer: Medicare Other | Admitting: Internal Medicine

## 2019-07-31 ENCOUNTER — Ambulatory Visit: Payer: Medicare Other | Admitting: Internal Medicine

## 2019-07-31 ENCOUNTER — Other Ambulatory Visit: Payer: Self-pay

## 2019-07-31 VITALS — BP 194/91 | HR 67 | Temp 96.9°F | Ht 69.5 in | Wt 240.6 lb

## 2019-07-31 DIAGNOSIS — I2510 Atherosclerotic heart disease of native coronary artery without angina pectoris: Secondary | ICD-10-CM

## 2019-07-31 DIAGNOSIS — K219 Gastro-esophageal reflux disease without esophagitis: Secondary | ICD-10-CM | POA: Diagnosis not present

## 2019-07-31 DIAGNOSIS — K5732 Diverticulitis of large intestine without perforation or abscess without bleeding: Secondary | ICD-10-CM

## 2019-07-31 MED ORDER — METRONIDAZOLE 500 MG PO TABS: 500.0000 mg | ORAL_TABLET | Freq: Two times a day (BID) | ORAL | 0 refills | Status: DC

## 2019-07-31 MED ORDER — CIPROFLOXACIN HCL 250 MG PO TABS: 750.0000 mg | ORAL_TABLET | Freq: Two times a day (BID) | ORAL | 0 refills | Status: AC

## 2019-07-31 NOTE — Patient Instructions (Signed)
Low fiber /  Low residue diet  -  Information provided  Stop benefiber for now  Cipro 750 mg twice daily and Flagyl 500 mg 3x daily - both for 10 days - no refills  As discussed, treating you for diverticulitis; if you don't rapidly improve, will need further evaluation including a CT scan  Call me on Wednesday, August 05, 2019 and let me know how you are doing.  BP elevated; see primary care physician OV in 6 weeks

## 2019-07-31 NOTE — Progress Notes (Signed)
Primary Care Physician:  [name], MD Primary Gastroenterologist:  Dr.   Pre-Procedure History & Physical: HPI:  Julian Fowler is a 84 y.o. male here for for evaluation of a one-week history of left lower quadrant abdominal pain - started after he ate pizza and salad at a restaurant.  Describes a localized left mid abdominal pain present since that time.  Saw PCP -  Describes plain films reportedly negative.  Saw urologist not felt to be urological in origin per patient report..  Chronic constipation with no real change in bowel habits on Benefiber and MiraLAX recently.  No rectal bleeding.  Prior colonoscopy -  diverticulosis.  He has had no cross-sectional imaging; no fever, chills, no nausea or vomiting.  Pain has improved but still has it to some degree; no antibiotics thus far. Hemorrhoids successfully treated with banding through this office previously. BP significantly elevated today and previously in this office.  He is on multiple meds.  Being evaluated by PCP/cardiologist. GERD well-controlled on esomeprazole 40 mg daily.  Past Medical History:  Diagnosis Date  . Abdominal aorta injury    Normal size, ultrasound, March 17,2011  . Allergic rhinitis   . Arthritis   . Asbestos exposure Jan 2001   Hx of asbestos related pleural plaques  . Balance problem   . BPH (benign prostatic hyperplasia)   . Complication of anesthesia    "trouble waking up"  . Easy bruisability   . ED (erectile dysfunction)   . Ejection fraction    60%, echo, 2008, mild RV dysfunction  . GERD (gastroesophageal reflux disease)   . H/O hiatal hernia   . Hearing loss in left ear   . History of shingles    "mild"  . Hyperlipidemia    under control  . Hypertension December 29, 2009   renal artery Doppler , normal  12/2009  . Overactive bladder   . Sleep apnea    was dx 10 years ago reports lost a lot of weight and no longer machine  . Tinnitus     Past Surgical History:  Procedure  Laterality Date  . BACK SURGERY  2014   herniated L1, L2   Dr Carloyn Manner  . BRAIN SURGERY    . CEREBRAL EMBOLIZATION  12/2011   "radiation therapy-did not work"  . CHOLECYSTECTOMY  6/98  . COLONOSCOPY  09/2009   Dr. Lindalou Hose: normal, internal hemorrhoids   . COLONOSCOPY N/A 02/28/2017   Dr. Gala Romney: Hemorrhoids, grade 3, mild diverticulosis.  Marland Kitchen CRANIECTOMY FOR EXCISION OF ACOUSTIC NEUROMA  3/95  . MINOR AMPUTATION OF DIGIT Left 12/31/2018   Procedure: REVISION AMPUTATION OF LEFT INDEX FINGER, IRRIGATION AND DEBRIDEMENT LEFT INDEX FINGER;  Surgeon: Verner Mould, MD;  Location: [facility];  Service: Orthopedics;  Laterality: Left;  . RADIOLOGY WITH ANESTHESIA N/A 07/07/2014   Procedure: EMBOLIZATION;  Surgeon: Rob Hickman, MD;  Location: [facility];  Service: Radiology;  Laterality: N/A;  . TOTAL KNEE ARTHROPLASTY Left 07/06/2013   Procedure: LEFT TOTAL KNEE ARTHROPLASTY;  Surgeon: Gearlean Alf, MD;  Location: [facility];  Service: Orthopedics;  Laterality: Left;  . TOTAL KNEE ARTHROPLASTY Right 01/18/2014   Procedure: RIGHT TOTAL KNEE ARTHROPLASTY;  Surgeon: Gearlean Alf, MD;  Location: [facility];  Service: Orthopedics;  Laterality: Right;  . TRIGGER FINGER RELEASE  2003   (thumb) middle finger (2006)    Prior to Admission medications   Medication Sig Start Date End Date Taking? Authorizing Provider  acetaminophen (  TYLENOL) 325 MG tablet Take 325 mg by mouth every 6 (six) hours as needed for pain.     [provider]  albuterol (PROAIR HFA) 108 (90 Base) MCG/ACT inhaler Inhale 2 puffs into the lungs every 6 (six) hours as needed for wheezing or shortness of breath. 12/23/18   Baird Lyons D, MD  aspirin EC 81 MG tablet Take 81 mg by mouth daily.    [provider]  azelastine (ASTELIN) 0.1 % nasal spray 1-2 puffs each nostril every 8 hours if needed for drainage 12/17/17   Baird Lyons D, MD  Calcium Carb-Vit D-C-E-Mineral (OS-CAL ULTRA) 600 MG TABS Take 1 tablet by mouth  daily.     [provider]  diltiazem (CARDIZEM CD) 120 MG 24 hr capsule Take 1 capsule by mouth twice daily 06/03/19   Herminio Commons, MD  esomeprazole (NEXIUM) 40 MG capsule Take 40 mg by mouth every morning.     [provider]  fexofenadine (ALLEGRA) 180 MG tablet Take 180 mg by mouth daily.    [provider]  fluticasone (FLONASE) 50 MCG/ACT nasal spray Place 2 sprays into both nostrils daily. Patient taking differently: Place 2 sprays into both nostrils daily as needed for allergies.  05/03/16   Young, Tarri Fuller D, MD  fluticasone furoate-vilanterol (BREO ELLIPTA) 100-25 MCG/INH AEPB Inhale 1 puff into the lungs daily. 12/23/18   Baird Lyons D, MD  furosemide (LASIX) 20 MG tablet Take 1 tablet (20 mg total) by mouth as needed. Patient taking differently: Take 20 mg by mouth as needed. 9/10 pt reports taking 20 mg once or twice weekly 12/27/17 07/13/19  Herminio Commons, MD  hydrALAZINE (APRESOLINE) 50 MG tablet Take 1 tablet (50 mg total) by mouth 3 (three) times daily. 07/13/19   Herminio Commons, MD  latanoprost (XALATAN) 0.005 % ophthalmic solution Place 1 drop into both eyes at bedtime.  09/01/12   [provider]  LORazepam (ATIVAN) 1 MG tablet Take 1 tablet (1 mg total) by mouth 3 (three) times daily as needed. 02/11/19   Deneise Lever, MD  losartan (COZAAR) 100 MG tablet Take 100 mg by mouth every morning.     [provider]  Multiple Vitamins-Minerals (CENTRUM SILVER PO) Take 1 tablet by mouth daily.     [provider]  polyethylene glycol (MIRALAX / GLYCOLAX) packet Take 17 g by mouth daily.    [provider]  potassium chloride SA (K-DUR) 20 MEQ tablet Take 1 tablet (20 mEq total) by mouth 2 (two) times daily. 06/18/19   Herminio Commons, MD  pravastatin (PRAVACHOL) 40 MG tablet Take 1 tablet by mouth at bedtime.  10/05/14   [provider]  sildenafil (REVATIO) 20 MG tablet Take 1-2 tablets by  mouth as needed. 11/23/17   [provider]  traMADol (ULTRAM) 50 MG tablet Take 1 tablet by mouth 2 (two) times daily as needed. 11/20/17   [provider]  Wheat Dextrin (BENEFIBER) POWD Take by mouth 2 (two) times daily. Takes 1 Tbsp twice daily    [provider]  zolpidem (AMBIEN) 10 MG tablet Take 1 tablet by mouth at bedtime.  01/24/16   [provider]    Allergies as of 07/31/2019 - Review Complete 07/31/2019  Allergen Reaction Noted  . Prednisone  12/17/2017  . Codeine Other (See Comments)   . Tape Other (See Comments) 04/10/2012    Family History  Problem Relation Age of Onset  . Cancer Father  oral cancer  . Breast cancer Mother   . Heart attack Mother   . Hypertension Sister        Bypass x4  . Alcohol abuse Sister   . Alzheimer's disease Sister   . Kidney disease Sister   . Colon cancer Neg Hx     Social History   Socioeconomic History  . Marital status: Married    Spouse name: Not on file  . Number of children: Not on file  . Years of education: Not on file  . Highest education level: Not on file  Occupational History  . Not on file  Social Needs  . Financial resource strain: Not on file  . Food insecurity    Worry: Not on file    Inability: Not on file  . Transportation needs    Medical: Not on file    Non-medical: Not on file  Tobacco Use  . Smoking status: Former Smoker    Packs/day: 1.50    Years: 30.00    Pack years: 45.00    Types: Cigarettes    Start date: 04/24/1957    Quit date: 10/15/1986    Years since quitting: 32.8  . Smokeless tobacco: Never Used  Substance and Sexual Activity  . Alcohol use: No    Alcohol/week: 0.0 standard drinks    Comment: Used to drink heavily at times  . Drug use: No  . Sexual activity: Not on file  Lifestyle  . Physical activity    Days per week: Not on file    Minutes per session: Not on file  . Stress: Not on file  Relationships  . Social Herbalist on  phone: Not on file    Gets together: Not on file    Attends religious service: Not on file    Active member of club or organization: Not on file    Attends meetings of clubs or organizations: Not on file    Relationship status: Not on file  . Intimate partner violence    Fear of current or ex partner: Not on file    Emotionally abused: Not on file    Physically abused: Not on file    Forced sexual activity: Not on file  Other Topics Concern  . Not on file  Social History Narrative   Co-dependent relationship with his 35 year old son who has drug and financial problems   Recently married to [place] on 01/02/2014    Review of Systems: See HPI, otherwise negative ROS  Physical Exam: BP (!) 194/91   Pulse 67   Temp (!) 96.9 F (36.1 C)   Ht 5' 9.5" (1.765 m)   Wt 240 lb 9.6 oz (109.1 kg)   BMI 35.02 kg/m  General:   Alert,  W pleasant and cooperative in NAD Neck:  Supple; no masses or thyromegaly. No significant cervical adenopathy. Lungs:  Clear throughout to auscultation.   No wheezes, crackles, or rhonchi. No acute distress. Heart:  Regular rate and rhythm; no murmurs, clicks, rubs,  or gallops. Abdomen: Obese.  Positive bowel sounds, soft; he does have localized left mid abdominal tenderness to deep palpation.  No appreciable mass or organomegaly.   Pulses:  Normal pulses noted. Extremities:  Without clubbing or edema.  Impression/Plan:  84 year old gentleman with 1 week history of left-sided abdominal pain.  Recent urological evaluation.  Clinically, sounds like diverticulitis which may have improved a bit over the past couple days.  He does not look acutely ill  or toxic at this time.  I do not see an imminent surgical process.  I doubt musculoskeletal or radicular etiology. We talked about further evaluation including cross-sectional imaging via CT versus embarking on empiric antibiotic treatment for diverticulitis. After discussion, it was decided to treat with antibiotics with  close interval follow-up.  Recommendations:   Low fiber /  Low residue diet for now -  Information provided  Stop benefiber for now  Cipro 750 mg twice daily and Flagyl 500 mg 3x daily - both for 10 days - no refills  As discussed, if he does not markedly improve over the short interval further evaluation will be needed.  Patient is to call in on Wednesday, October 21 with a progress report.  GERD well-controlled on esomeprazole-patient encontinued that regimen.  Blood pressure elevated here today and previously.  May be an element of whitecoat hypertension but I suspect baseline poorly controlled hypertension.    He was strongly urged to follow-up with his primary care physician. OV in 6 weeks        Notice: This dictation was prepared with Dragon dictation along with smaller phrase technology. Any transcriptional errors that result from this process are unintentional and may not be corrected upon review.

## 2019-08-03 ENCOUNTER — Other Ambulatory Visit: Payer: Medicare Other | Admitting: Orthotics

## 2019-08-05 ENCOUNTER — Telehealth: Payer: Self-pay | Admitting: Internal Medicine

## 2019-08-05 NOTE — Telephone Encounter (Signed)
Spoke with pt. Pt called with an update s/p his apt on 07/31/2019 for diverticulitis flare.  Pt  is doing much better since he started the Cipro and Flagyl. Pt isn't feeling any abdominal pain like he previously was. Pt will continue the medications until they are gone.

## 2019-08-05 NOTE — Telephone Encounter (Signed)
I called patient.  Abdominal pain has resolved.  He wants to liberalize his diet.  He increased his MiraLAX and this is been very helpful achieving BMs.  Advised him to complete the course of antibiotics.  By this weekend he could start advance his diet.  Suggest resuming Benefiber in 1 week.  He may take MiraLAX 1-2 times in a day chronically as needed for constipation he is to call if he has any problems.

## 2019-08-05 NOTE — Telephone Encounter (Signed)
Pt called to say he was feeling much better and would like for RMR to call him when he had time. [phone]

## 2019-08-10 NOTE — Telephone Encounter (Signed)
Noted  

## 2019-08-12 DIAGNOSIS — I10 Essential (primary) hypertension: Secondary | ICD-10-CM | POA: Diagnosis not present

## 2019-08-12 DIAGNOSIS — M5416 Radiculopathy, lumbar region: Secondary | ICD-10-CM | POA: Diagnosis not present

## 2019-08-12 DIAGNOSIS — Z6835 Body mass index (BMI) 35.0-35.9, adult: Secondary | ICD-10-CM | POA: Diagnosis not present

## 2019-08-13 ENCOUNTER — Other Ambulatory Visit: Payer: Medicare Other | Admitting: Orthotics

## 2019-08-21 ENCOUNTER — Other Ambulatory Visit: Payer: Self-pay | Admitting: Cardiovascular Disease

## 2019-08-25 ENCOUNTER — Other Ambulatory Visit: Payer: Self-pay

## 2019-08-25 ENCOUNTER — Ambulatory Visit: Payer: Medicare Other | Admitting: Orthotics

## 2019-08-25 DIAGNOSIS — M79671 Pain in right foot: Secondary | ICD-10-CM

## 2019-08-25 DIAGNOSIS — M21371 Foot drop, right foot: Secondary | ICD-10-CM

## 2019-08-25 NOTE — Progress Notes (Signed)
Spent considerable time with patient discussing mobility/ambulatory related issues. Patient has had several falls due to uncontrollable external tibial rotation R (abduction), combined with foot drop.   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   Given hx, I suggest a OTS brace with energy returning carbon foot plate/ w/ anterior shell.   Allard TOE OFF w/ soft kit ordered.

## 2019-08-27 DIAGNOSIS — I739 Peripheral vascular disease, unspecified: Secondary | ICD-10-CM | POA: Diagnosis not present

## 2019-09-03 DIAGNOSIS — H9209 Otalgia, unspecified ear: Secondary | ICD-10-CM | POA: Diagnosis not present

## 2019-09-08 ENCOUNTER — Other Ambulatory Visit: Payer: Self-pay

## 2019-09-08 ENCOUNTER — Ambulatory Visit (INDEPENDENT_AMBULATORY_CARE_PROVIDER_SITE_OTHER): Payer: Medicare Other | Admitting: Orthotics

## 2019-09-08 DIAGNOSIS — M21371 Foot drop, right foot: Secondary | ICD-10-CM | POA: Diagnosis not present

## 2019-09-09 NOTE — Progress Notes (Signed)
Patient picked up brace today (OTS-Allard)' he was pleased with the function and had remarkable toe clearance at toe off in gait cycle.

## 2019-09-11 ENCOUNTER — Other Ambulatory Visit: Payer: Self-pay | Admitting: Cardiovascular Disease

## 2019-09-18 ENCOUNTER — Ambulatory Visit: Payer: Medicare Other | Admitting: Internal Medicine

## 2019-10-02 DIAGNOSIS — Z87891 Personal history of nicotine dependence: Secondary | ICD-10-CM | POA: Diagnosis not present

## 2019-10-02 DIAGNOSIS — H608X1 Other otitis externa, right ear: Secondary | ICD-10-CM | POA: Diagnosis not present

## 2019-10-02 DIAGNOSIS — Z86018 Personal history of other benign neoplasm: Secondary | ICD-10-CM | POA: Diagnosis not present

## 2019-10-02 DIAGNOSIS — H6123 Impacted cerumen, bilateral: Secondary | ICD-10-CM | POA: Diagnosis not present

## 2019-10-02 DIAGNOSIS — H903 Sensorineural hearing loss, bilateral: Secondary | ICD-10-CM | POA: Diagnosis not present

## 2019-10-02 DIAGNOSIS — D333 Benign neoplasm of cranial nerves: Secondary | ICD-10-CM | POA: Insufficient documentation

## 2019-11-02 ENCOUNTER — Encounter: Payer: Self-pay | Admitting: Urology

## 2019-11-12 DIAGNOSIS — I739 Peripheral vascular disease, unspecified: Secondary | ICD-10-CM | POA: Diagnosis not present

## 2019-11-12 DIAGNOSIS — L110 Acquired keratosis follicularis: Secondary | ICD-10-CM | POA: Diagnosis not present

## 2019-11-12 DIAGNOSIS — M79671 Pain in right foot: Secondary | ICD-10-CM | POA: Diagnosis not present

## 2019-11-12 DIAGNOSIS — M79672 Pain in left foot: Secondary | ICD-10-CM | POA: Diagnosis not present

## 2019-11-14 ENCOUNTER — Ambulatory Visit: Payer: Medicare Other

## 2019-11-18 ENCOUNTER — Telehealth: Payer: Self-pay

## 2019-11-18 DIAGNOSIS — R197 Diarrhea, unspecified: Secondary | ICD-10-CM | POA: Diagnosis not present

## 2019-11-18 DIAGNOSIS — R140 Abdominal distension (gaseous): Secondary | ICD-10-CM | POA: Diagnosis not present

## 2019-11-18 NOTE — Telephone Encounter (Signed)
VM received. Lmom, waiting on a return call.

## 2019-11-19 ENCOUNTER — Ambulatory Visit: Payer: Medicare Other

## 2019-11-19 DIAGNOSIS — R140 Abdominal distension (gaseous): Secondary | ICD-10-CM | POA: Diagnosis not present

## 2019-11-19 DIAGNOSIS — R197 Diarrhea, unspecified: Secondary | ICD-10-CM | POA: Diagnosis not present

## 2019-11-19 DIAGNOSIS — Z23 Encounter for immunization: Secondary | ICD-10-CM | POA: Diagnosis not present

## 2019-11-25 ENCOUNTER — Telehealth: Payer: Self-pay | Admitting: *Deleted

## 2019-11-25 ENCOUNTER — Ambulatory Visit: Payer: Medicare Other

## 2019-11-25 NOTE — Telephone Encounter (Signed)
Pt says BP 200/92 and has been running 180s/90s for the last week or so - c/o dull headache for the last 2 days - HR 70s - denies dizziness/chest pain/SOB - no recent med changes

## 2019-11-25 NOTE — Telephone Encounter (Signed)
Pt voiced understanding - updated medication list 

## 2019-11-25 NOTE — Telephone Encounter (Signed)
Increase hydralazine to 75 mg TID.

## 2019-12-01 ENCOUNTER — Other Ambulatory Visit: Payer: Self-pay | Admitting: Internal Medicine

## 2019-12-01 NOTE — Telephone Encounter (Signed)
Ativan refill e-sent

## 2019-12-01 NOTE — Telephone Encounter (Signed)
Dr. Annamaria Boots, please advise if you are okay refilling med for pt.  Allergies  Allergen Reactions  . Prednisone     Pt is high functioning and out of sorts.  . Codeine Other (See Comments)    REACTION: groggy  . Tape Other (See Comments)    Skin tears, use paper tape     Current Outpatient Medications:  .  acetaminophen (TYLENOL) 325 MG tablet, Take 325 mg by mouth as needed. , Disp: , Rfl:  .  albuterol (PROAIR HFA) 108 (90 Base) MCG/ACT inhaler, Inhale 2 puffs into the lungs every 6 (six) hours as needed for wheezing or shortness of breath., Disp: 18 g, Rfl: 12 .  aspirin EC 81 MG tablet, Take 81 mg by mouth daily., Disp: , Rfl:  .  azelastine (ASTELIN) 0.1 % nasal spray, 1-2 puffs each nostril every 8 hours if needed for drainage (Patient taking differently: as needed. 1-2 puffs each nostril every 8 hours if needed for drainage), Disp: 30 mL, Rfl: 12 .  Calcium Carb-Vit D-C-E-Mineral (OS-CAL ULTRA) 600 MG TABS, Take 1 tablet by mouth daily. , Disp: , Rfl:  .  CARTIA XT 120 MG 24 hr capsule, Take 1 capsule by mouth twice daily, Disp: 180 capsule, Rfl: 2 .  esomeprazole (NEXIUM) 40 MG capsule, Take 40 mg by mouth every morning. , Disp: , Rfl:  .  fexofenadine (ALLEGRA) 180 MG tablet, Take 180 mg by mouth as needed. , Disp: , Rfl:  .  fluticasone (FLONASE) 50 MCG/ACT nasal spray, Place 2 sprays into both nostrils daily. (Patient taking differently: Place 2 sprays into both nostrils daily as needed for allergies. ), Disp: 18.2 g, Rfl: 12 .  fluticasone furoate-vilanterol (BREO ELLIPTA) 100-25 MCG/INH AEPB, Inhale 1 puff into the lungs daily. (Patient taking differently: Inhale 1 puff into the lungs as needed. ), Disp: 2 each, Rfl: 0 .  furosemide (LASIX) 20 MG tablet, Take 1 tablet (20 mg total) by mouth as needed. (Patient taking differently: Take 20 mg by mouth as needed. 9/10 pt reports taking 20 mg once or twice weekly), Disp: , Rfl:  .  hydrALAZINE (APRESOLINE) 50 MG tablet, Take 75 mg by  mouth 3 (three) times daily., Disp: , Rfl:  .  latanoprost (XALATAN) 0.005 % ophthalmic solution, Place 1 drop into both eyes at bedtime. , Disp: , Rfl:  .  LORazepam (ATIVAN) 1 MG tablet, Take 1 tablet (1 mg total) by mouth 3 (three) times daily as needed. (Patient taking differently: Take 1 mg by mouth daily. ), Disp: 180 tablet, Rfl: 1 .  losartan (COZAAR) 100 MG tablet, Take 100 mg by mouth every morning. , Disp: , Rfl:  .  metroNIDAZOLE (FLAGYL) 500 MG tablet, Take 1 tablet (500 mg total) by mouth 2 (two) times daily., Disp: 20 tablet, Rfl: 0 .  Multiple Vitamins-Minerals (CENTRUM SILVER PO), Take 1 tablet by mouth daily. , Disp: , Rfl:  .  polyethylene glycol (MIRALAX / GLYCOLAX) packet, Take 17 g by mouth daily., Disp: , Rfl:  .  potassium chloride SA (KLOR-CON) 20 MEQ tablet, Take 1 tablet by mouth twice daily, Disp: 180 tablet, Rfl: 1 .  pravastatin (PRAVACHOL) 40 MG tablet, Take 1 tablet by mouth at bedtime. , Disp: , Rfl:  .  sildenafil (REVATIO) 20 MG tablet, Take 1-2 tablets by mouth as needed., Disp: , Rfl: 1 .  traMADol (ULTRAM) 50 MG tablet, Take 1 tablet by mouth as needed. , Disp: , Rfl:  .  Wheat  Dextrin (BENEFIBER) POWD, Take by mouth 2 (two) times daily. Takes 1 Tbsp twice daily, Disp: , Rfl:  .  zolpidem (AMBIEN) 10 MG tablet, Take 1 tablet by mouth at bedtime. , Disp: , Rfl:

## 2019-12-18 DIAGNOSIS — Z23 Encounter for immunization: Secondary | ICD-10-CM | POA: Diagnosis not present

## 2019-12-23 ENCOUNTER — Ambulatory Visit (INDEPENDENT_AMBULATORY_CARE_PROVIDER_SITE_OTHER): Payer: Medicare Other

## 2019-12-23 ENCOUNTER — Encounter: Payer: Self-pay | Admitting: Internal Medicine

## 2019-12-23 ENCOUNTER — Ambulatory Visit (INDEPENDENT_AMBULATORY_CARE_PROVIDER_SITE_OTHER): Payer: Medicare Other | Admitting: Internal Medicine

## 2019-12-23 ENCOUNTER — Other Ambulatory Visit: Payer: Self-pay

## 2019-12-23 VITALS — BP 142/72 | HR 62 | Temp 97.6°F | Ht 69.5 in | Wt 241.6 lb

## 2019-12-23 DIAGNOSIS — Z77090 Contact with and (suspected) exposure to asbestos: Secondary | ICD-10-CM

## 2019-12-23 DIAGNOSIS — J4520 Mild intermittent asthma, uncomplicated: Secondary | ICD-10-CM

## 2019-12-23 DIAGNOSIS — J61 Pneumoconiosis due to asbestos and other mineral fibers: Secondary | ICD-10-CM | POA: Diagnosis not present

## 2019-12-23 DIAGNOSIS — J929 Pleural plaque without asbestos: Secondary | ICD-10-CM | POA: Diagnosis not present

## 2019-12-23 MED ORDER — BUDESONIDE-FORMOTEROL FUMARATE 160-4.5 MCG/ACT IN AERO: INHALATION_SPRAY | RESPIRATORY_TRACT | 12 refills | Status: DC

## 2019-12-23 NOTE — Assessment & Plan Note (Signed)
Increased wheezing off maintenance controller Plan- Symbicort
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
## 2019-12-23 NOTE — Assessment & Plan Note (Signed)
Asbestos exposure with plaques, but has not had significant interstitial asbestosis. Left lower rib or upper abdominal pain related to meals has been attributed to diverticulitis by his GI as patient reports.  Plan- CXR

## 2019-12-23 NOTE — Patient Instructions (Signed)
Script sent for Symbicort 160    Inhale 2 puffs then rinse mouth, twice daily This is an every day maintenance inhaler. See if it clears up the wheeze you've been noticing. You can still use your albuterol rescue inhaler up to every 6 hours, if needed.  Order CXR   Dx asbestos exposure  Please call if we can help

## 2020-01-01 ENCOUNTER — Other Ambulatory Visit: Payer: Self-pay | Admitting: *Deleted

## 2020-01-01 MED ORDER — HYDRALAZINE HCL 50 MG PO TABS: 75.0000 mg | ORAL_TABLET | Freq: Three times a day (TID) | ORAL | 3 refills | Status: DC

## 2020-01-01 NOTE — Addendum Note (Signed)
Addended by: Merlene Laughter on: 01/01/2020 01:46 PM   Modules accepted: Orders

## 2020-01-18 DIAGNOSIS — M19072 Primary osteoarthritis, left ankle and foot: Secondary | ICD-10-CM | POA: Diagnosis not present

## 2020-01-18 DIAGNOSIS — S92235A Nondisplaced fracture of intermediate cuneiform of left foot, initial encounter for closed fracture: Secondary | ICD-10-CM | POA: Diagnosis not present

## 2020-01-18 DIAGNOSIS — M79672 Pain in left foot: Secondary | ICD-10-CM | POA: Diagnosis not present

## 2020-01-18 DIAGNOSIS — S92242A Displaced fracture of medial cuneiform of left foot, initial encounter for closed fracture: Secondary | ICD-10-CM | POA: Diagnosis not present

## 2020-01-21 DIAGNOSIS — M79671 Pain in right foot: Secondary | ICD-10-CM | POA: Diagnosis not present

## 2020-01-21 DIAGNOSIS — M25579 Pain in unspecified ankle and joints of unspecified foot: Secondary | ICD-10-CM | POA: Diagnosis not present

## 2020-01-21 DIAGNOSIS — M1990 Unspecified osteoarthritis, unspecified site: Secondary | ICD-10-CM | POA: Diagnosis not present

## 2020-01-21 DIAGNOSIS — I739 Peripheral vascular disease, unspecified: Secondary | ICD-10-CM | POA: Diagnosis not present

## 2020-01-21 DIAGNOSIS — M79672 Pain in left foot: Secondary | ICD-10-CM | POA: Diagnosis not present

## 2020-02-04 DIAGNOSIS — M79672 Pain in left foot: Secondary | ICD-10-CM | POA: Diagnosis not present

## 2020-02-04 DIAGNOSIS — M76822 Posterior tibial tendinitis, left leg: Secondary | ICD-10-CM | POA: Diagnosis not present

## 2020-02-19 DIAGNOSIS — M76822 Posterior tibial tendinitis, left leg: Secondary | ICD-10-CM | POA: Diagnosis not present

## 2020-02-19 DIAGNOSIS — M79672 Pain in left foot: Secondary | ICD-10-CM | POA: Diagnosis not present

## 2020-02-19 DIAGNOSIS — M19072 Primary osteoarthritis, left ankle and foot: Secondary | ICD-10-CM | POA: Diagnosis not present

## 2020-03-03 DIAGNOSIS — M722 Plantar fascial fibromatosis: Secondary | ICD-10-CM | POA: Diagnosis not present

## 2020-03-03 DIAGNOSIS — M79672 Pain in left foot: Secondary | ICD-10-CM | POA: Diagnosis not present

## 2020-03-03 DIAGNOSIS — M7732 Calcaneal spur, left foot: Secondary | ICD-10-CM | POA: Diagnosis not present

## 2020-03-09 ENCOUNTER — Other Ambulatory Visit: Payer: Self-pay | Admitting: Cardiovascular Disease

## 2020-03-17 ENCOUNTER — Other Ambulatory Visit: Payer: Self-pay | Admitting: Internal Medicine

## 2020-03-29 IMAGING — DX DG CHEST 2V
2 series · 2 of 2 positions shown · non-contrast
Comparison: 11/02/2018, CT 06/23/2019, 10/03/2018

CLINICAL DATA: Asbestos exposure

EXAM:
CHEST - 2 VIEW

[chest pa]
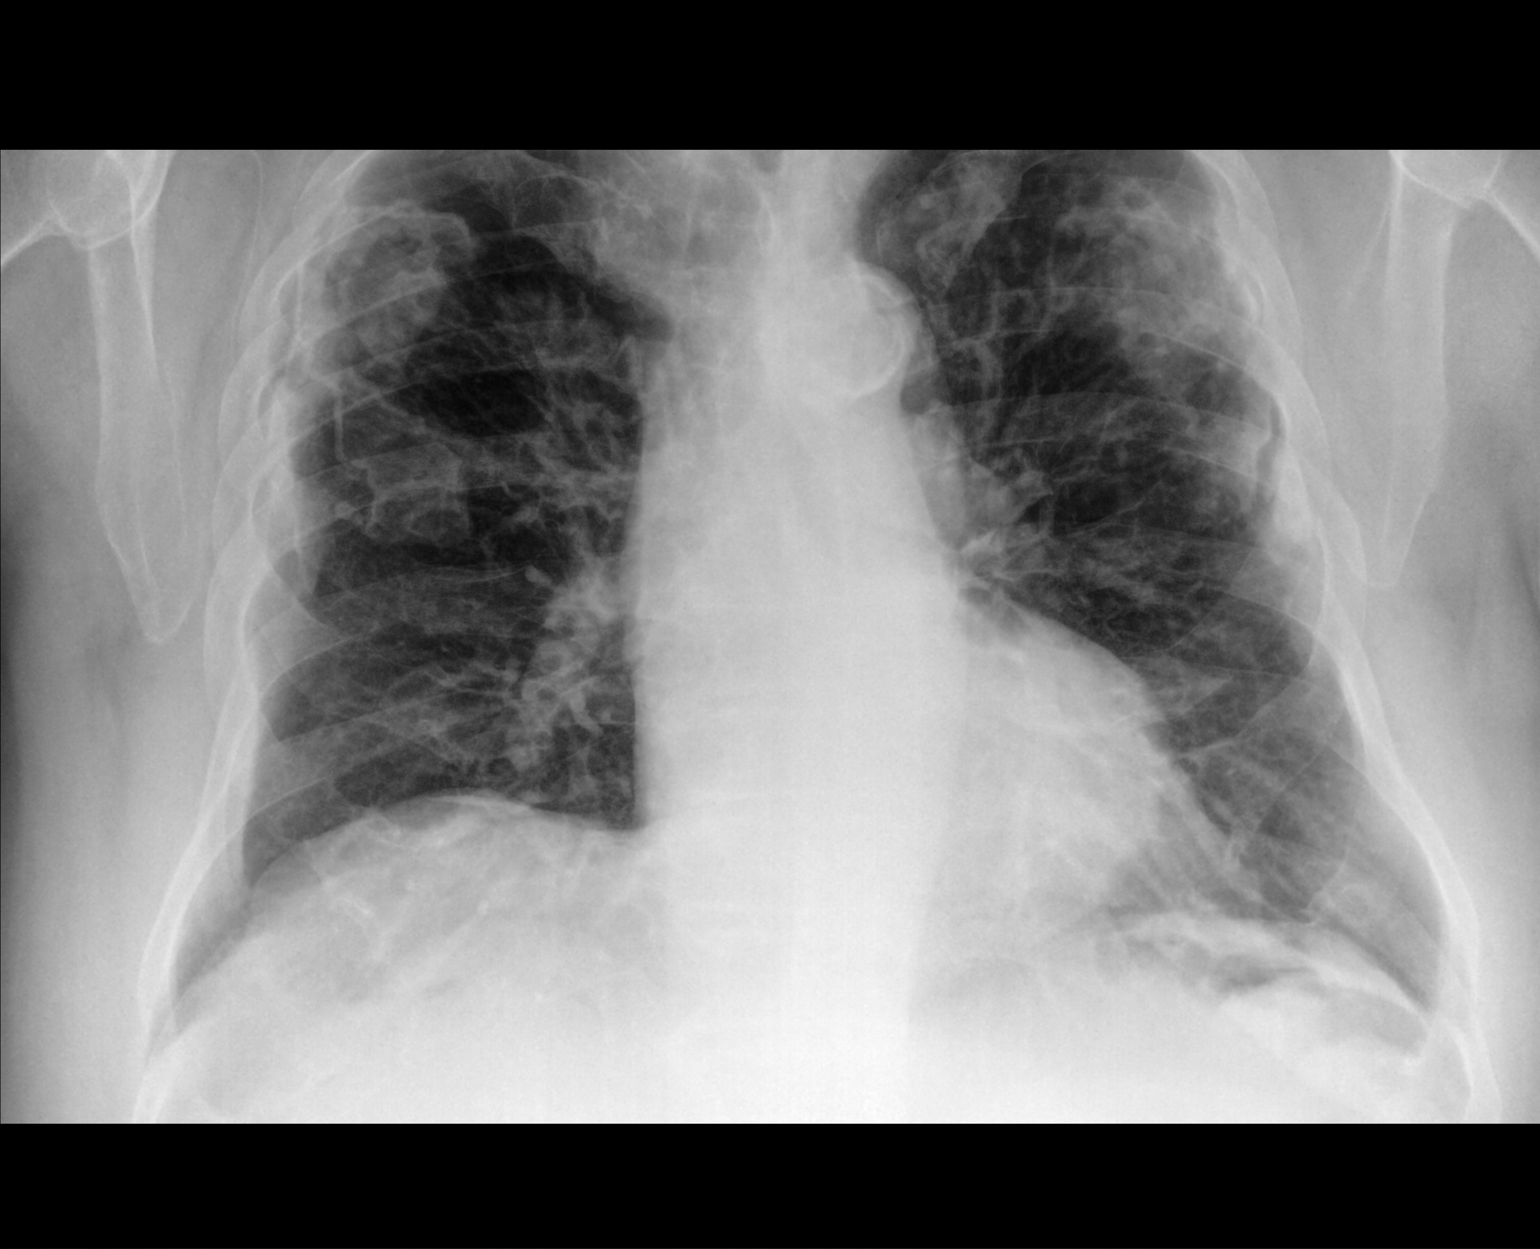

[chest lat]
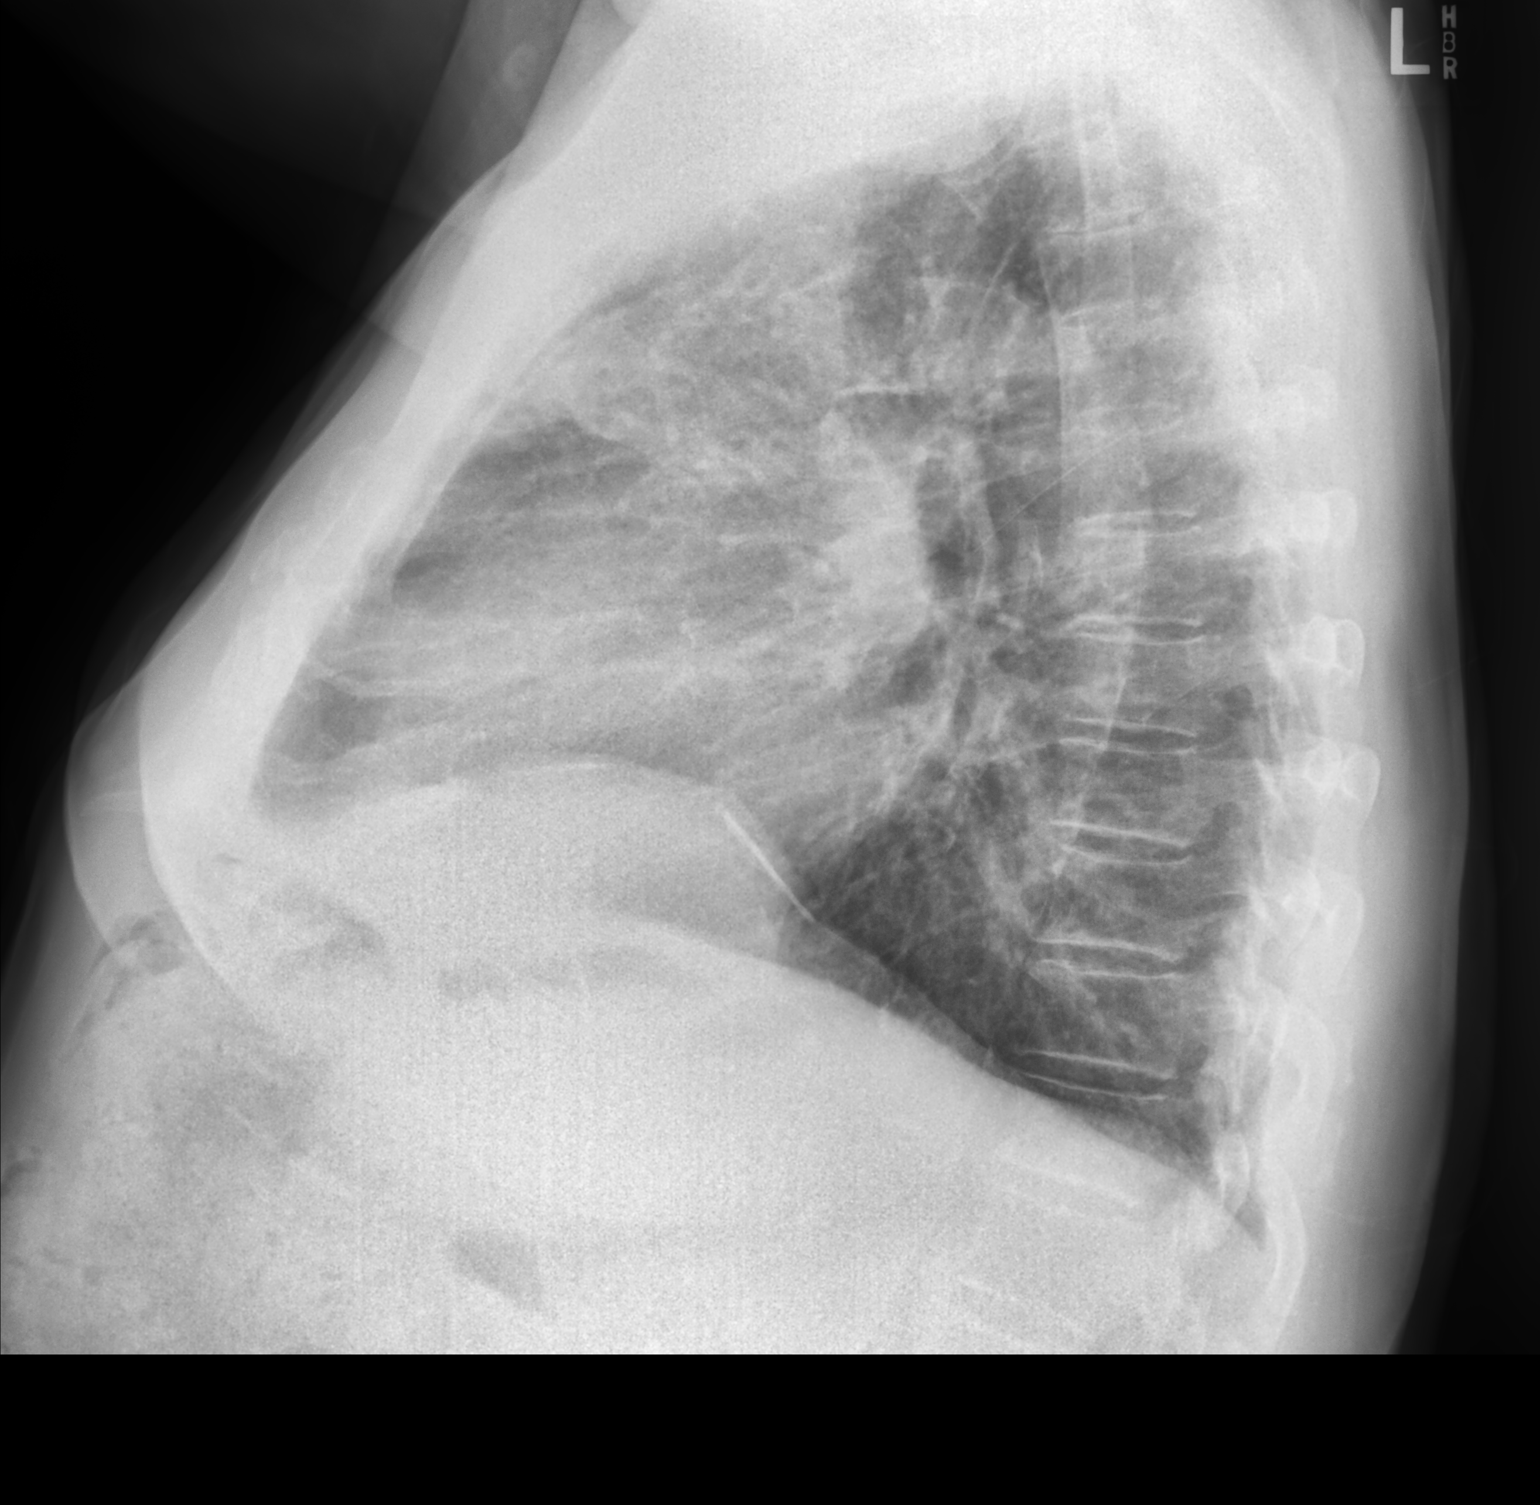

[2 of 2 positions shown; findings below may reference images not displayed]

FINDINGS: Bilateral calcified pleural plaques are redemonstrated. No acute
airspace disease or pleural effusion. Stable cardiomediastinal
silhouette with aortic atherosclerosis. No pneumothorax.
IMPRESSION: Extensive calcified pleural plaques consistent with asbestos related
disease, similar compared to prior.

## 2020-03-31 DIAGNOSIS — H90A21 Sensorineural hearing loss, unilateral, right ear, with restricted hearing on the contralateral side: Secondary | ICD-10-CM | POA: Diagnosis not present

## 2020-03-31 DIAGNOSIS — H9311 Tinnitus, right ear: Secondary | ICD-10-CM | POA: Diagnosis not present

## 2020-03-31 DIAGNOSIS — H60543 Acute eczematoid otitis externa, bilateral: Secondary | ICD-10-CM | POA: Diagnosis not present

## 2020-03-31 DIAGNOSIS — D333 Benign neoplasm of cranial nerves: Secondary | ICD-10-CM | POA: Diagnosis not present

## 2020-03-31 DIAGNOSIS — Q282 Arteriovenous malformation of cerebral vessels: Secondary | ICD-10-CM | POA: Diagnosis not present

## 2020-04-04 DIAGNOSIS — M79672 Pain in left foot: Secondary | ICD-10-CM | POA: Diagnosis not present

## 2020-04-04 DIAGNOSIS — I739 Peripheral vascular disease, unspecified: Secondary | ICD-10-CM | POA: Diagnosis not present

## 2020-04-04 DIAGNOSIS — M79671 Pain in right foot: Secondary | ICD-10-CM | POA: Diagnosis not present

## 2020-04-04 DIAGNOSIS — L110 Acquired keratosis follicularis: Secondary | ICD-10-CM | POA: Diagnosis not present

## 2020-04-21 DIAGNOSIS — H9190 Unspecified hearing loss, unspecified ear: Secondary | ICD-10-CM | POA: Diagnosis not present

## 2020-04-29 DIAGNOSIS — I10 Essential (primary) hypertension: Secondary | ICD-10-CM | POA: Diagnosis not present

## 2020-04-29 DIAGNOSIS — R972 Elevated prostate specific antigen [PSA]: Secondary | ICD-10-CM | POA: Diagnosis not present

## 2020-04-29 DIAGNOSIS — E782 Mixed hyperlipidemia: Secondary | ICD-10-CM | POA: Diagnosis not present

## 2020-04-29 DIAGNOSIS — R7301 Impaired fasting glucose: Secondary | ICD-10-CM | POA: Diagnosis not present

## 2020-04-29 DIAGNOSIS — E871 Hypo-osmolality and hyponatremia: Secondary | ICD-10-CM | POA: Diagnosis not present

## 2020-05-02 DIAGNOSIS — Z23 Encounter for immunization: Secondary | ICD-10-CM | POA: Diagnosis not present

## 2020-05-02 DIAGNOSIS — R972 Elevated prostate specific antigen [PSA]: Secondary | ICD-10-CM | POA: Diagnosis not present

## 2020-05-02 DIAGNOSIS — Z0001 Encounter for general adult medical examination with abnormal findings: Secondary | ICD-10-CM | POA: Diagnosis not present

## 2020-05-02 DIAGNOSIS — E782 Mixed hyperlipidemia: Secondary | ICD-10-CM | POA: Diagnosis not present

## 2020-05-02 DIAGNOSIS — E871 Hypo-osmolality and hyponatremia: Secondary | ICD-10-CM | POA: Diagnosis not present

## 2020-05-02 DIAGNOSIS — J61 Pneumoconiosis due to asbestos and other mineral fibers: Secondary | ICD-10-CM | POA: Diagnosis not present

## 2020-05-02 DIAGNOSIS — J4520 Mild intermittent asthma, uncomplicated: Secondary | ICD-10-CM | POA: Diagnosis not present

## 2020-05-02 DIAGNOSIS — I10 Essential (primary) hypertension: Secondary | ICD-10-CM | POA: Diagnosis not present

## 2020-05-05 ENCOUNTER — Encounter: Payer: Self-pay | Admitting: Neurology

## 2020-05-06 ENCOUNTER — Encounter: Payer: Self-pay | Admitting: Neurology

## 2020-05-09 DIAGNOSIS — L570 Actinic keratosis: Secondary | ICD-10-CM | POA: Diagnosis not present

## 2020-05-24 ENCOUNTER — Other Ambulatory Visit: Payer: Self-pay | Admitting: *Deleted

## 2020-05-24 MED ORDER — DILTIAZEM HCL ER COATED BEADS 120 MG PO CP24: 120.0000 mg | ORAL_CAPSULE | Freq: Two times a day (BID) | ORAL | 0 refills | Status: DC

## 2020-06-23 ENCOUNTER — Encounter: Payer: Self-pay | Admitting: Neurology

## 2020-06-23 ENCOUNTER — Ambulatory Visit (INDEPENDENT_AMBULATORY_CARE_PROVIDER_SITE_OTHER): Payer: Medicare Other | Admitting: Neurology

## 2020-06-23 ENCOUNTER — Other Ambulatory Visit: Payer: Self-pay

## 2020-06-23 VITALS — BP 171/76 | HR 68 | Ht 65.0 in | Wt 250.0 lb

## 2020-06-23 DIAGNOSIS — I10 Essential (primary) hypertension: Secondary | ICD-10-CM

## 2020-06-23 DIAGNOSIS — M792 Neuralgia and neuritis, unspecified: Secondary | ICD-10-CM | POA: Diagnosis not present

## 2020-06-23 DIAGNOSIS — H9201 Otalgia, right ear: Secondary | ICD-10-CM | POA: Diagnosis not present

## 2020-06-23 MED ORDER — NORTRIPTYLINE HCL 10 MG PO CAPS: 10.0000 mg | ORAL_CAPSULE | Freq: Every day | ORAL | 3 refills | Status: DC
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
## 2020-06-23 NOTE — Patient Instructions (Signed)
1.  Start nortriptyline 10mg  at bedtime.  If ear pain not improved in 6 weeks, contact me and we can increase dose.  Drink plenty of water as it may cause dry mouth.  Also, I want you to discontinue tramadol 2.  Follow up in 4 to 6 months.

## 2020-06-24 ENCOUNTER — Ambulatory Visit (INDEPENDENT_AMBULATORY_CARE_PROVIDER_SITE_OTHER): Payer: Medicare Other

## 2020-06-24 ENCOUNTER — Ambulatory Visit (INDEPENDENT_AMBULATORY_CARE_PROVIDER_SITE_OTHER): Payer: Medicare Other | Admitting: Internal Medicine

## 2020-06-24 ENCOUNTER — Encounter: Payer: Self-pay | Admitting: Internal Medicine

## 2020-06-24 VITALS — BP 132/72 | HR 82 | Temp 98.0°F | Ht 69.0 in | Wt 247.8 lb

## 2020-06-24 DIAGNOSIS — J4520 Mild intermittent asthma, uncomplicated: Secondary | ICD-10-CM

## 2020-06-24 DIAGNOSIS — J849 Interstitial pulmonary disease, unspecified: Secondary | ICD-10-CM | POA: Diagnosis not present

## 2020-06-24 DIAGNOSIS — J61 Pneumoconiosis due to asbestos and other mineral fibers: Secondary | ICD-10-CM

## 2020-06-24 DIAGNOSIS — J929 Pleural plaque without asbestos: Secondary | ICD-10-CM | POA: Diagnosis not present

## 2020-06-24 DIAGNOSIS — M47814 Spondylosis without myelopathy or radiculopathy, thoracic region: Secondary | ICD-10-CM | POA: Diagnosis not present

## 2020-06-24 DIAGNOSIS — Z77090 Contact with and (suspected) exposure to asbestos: Secondary | ICD-10-CM

## 2020-06-24 NOTE — Patient Instructions (Signed)
Order- CXR   Dx asbestos exposure  Ok to continue current inhalers  Plan on getting your Moderna covid vaccine booster this Fall  Plan on getting your flu shot this Fall  Please cal if we can help

## 2020-06-24 NOTE — Progress Notes (Signed)
HPI male former smoker followed for allergic rhinitis, asthma,  history OSA, asbestos exposure/plaques, chronic insomnia Office spirometry 05/03/15- WNL- FVC 3.33/ 78%, FEV1 2.49/ 78%, r 0.75, FEF25-75% 1.92/ 69%.. -----------------------------------------------------------------------------------------------    12/23/19- 85 year old male former smoker followed for allergic rhinitis, Asthma,  history OSA/ weight loss, Asbestos exposure/plaques/ interstitial lung disease, chronic insomnia, Diverticulitis, CT chest update showed stable plaques and did not report the patchy reticulation noted on CT of 11/02/18.   -----f/u OSA/seasonal and perennial allergic rhinitis. Patient stated he has been having some wheezing in pm and am.  ambien 10, albuterol hfa, flonase,  Body weight 241 lbs Had Moderna covid vax x 2                Wife here Dislike Breo- dry powder and cost. Without it has been noting more wheeze.  Asks for CXR- having occasional left lower anterior rib area pains,worse with eating and not affected by bending or stretching. No discolored sputum. Gi told him he had diverticulitis.  06/24/20- 86 year old male Former Smoker followed for allergic rhinitis, Asthma,  history OSA/ weight loss, Asbestos exposure/plaques/ interstitial lung disease, chronic insomnia, Diverticulitis, SVT, HTN, GERD,  Body weight today-  Had 2 Moderna covax  Has seen neurology for neuralgia R ear> nortriptyline Symbicort 160, Proair hfa Not needing rescue often. No cough.   ROS See HPI  + = positive Constitutional:   No-   weight loss, night sweats, fevers, chills, fatigue, lassitude. HEENT:   + headaches,  No -difficulty swallowing, tooth/dental problems, sore throat,       No-  sneezing, itching, ear ache,  +nasal congestion, +post nasal drip,  CV:  + chest pain, orthopnea, PND, swelling in lower extremities, no-anasarca, dizziness, palpitations Resp: No-   shortness of breath with exertion or at rest.                 productive cough,  No non-productive cough,  No-  coughing up of blood.              No-   change in color of mucus.  No- wheezing.   Skin: No-   rash or lesions. GI:  No-   heartburn, indigestion, abdominal pain, nausea, vomiting,             GU:  MS:  + joint pain or swelling. .  + back pain. Neuro-  nothing unusual  Psych:  No- change in mood or affect. No depression or anxiety.  No memory loss.  OBJ  General- Alert, Oriented, Affect-appropriate, Distress- none acute, + obese, +talkative  Skin- rash-none, lesions- none, excoriation- none,  Lymphadenopathy- none Head- atraumatic            Eyes- Gross vision intact, PERRLA, conjunctivae clear secretions            Ears- +hard of hearing/hearing aid            Nose- + turbinate edema, no-Septal dev, mucus, polyps, erosion, perforation             Throat- Mallampati II , mucosa clear , drainage- none, tonsils- atrophic. Dental repair. Neck- flexible , trachea midline, no stridor , thyroid nl, carotid no bruit Chest - symmetrical excursion , unlabored           Heart/CV-  RRR , +1/6 S murmur , no gallop  , no rub, nl s1 s2                           -  JVD- none , edema + chronic right lower leg, varices- none           Lungs-  dullness-none, rub- none.            Chest wall-  Abd-  Br/ Gen/ Rectal- Not done, not indicated Extrem-  Neuro- grossly intact to observation. Speech is clear.

## 2020-06-27 DIAGNOSIS — L600 Ingrowing nail: Secondary | ICD-10-CM | POA: Diagnosis not present

## 2020-06-27 DIAGNOSIS — M79671 Pain in right foot: Secondary | ICD-10-CM | POA: Diagnosis not present

## 2020-06-27 DIAGNOSIS — M79674 Pain in right toe(s): Secondary | ICD-10-CM | POA: Diagnosis not present

## 2020-06-27 DIAGNOSIS — L03031 Cellulitis of right toe: Secondary | ICD-10-CM | POA: Diagnosis not present

## 2020-07-03 DIAGNOSIS — Z23 Encounter for immunization: Secondary | ICD-10-CM | POA: Diagnosis not present

## 2020-07-04 ENCOUNTER — Telehealth: Payer: Self-pay | Admitting: Internal Medicine

## 2020-07-04 DIAGNOSIS — M722 Plantar fascial fibromatosis: Secondary | ICD-10-CM | POA: Diagnosis not present

## 2020-07-04 DIAGNOSIS — M79672 Pain in left foot: Secondary | ICD-10-CM | POA: Diagnosis not present

## 2020-07-04 NOTE — Telephone Encounter (Signed)
Spoke with the pt's spouse and verified address and request for cxr results  Copy printed and mailed  Nothing further needed

## 2020-07-06 ENCOUNTER — Encounter: Payer: Self-pay | Admitting: Internal Medicine

## 2020-07-06 NOTE — Assessment & Plan Note (Signed)
Mild intermittent uncomplicated.  Plan- keep meds available

## 2020-07-06 NOTE — Assessment & Plan Note (Signed)
Pleural plaques. No evidence of progressive disease.  Plan- CXR

## 2020-07-06 NOTE — Assessment & Plan Note (Signed)
Clinically stable.  Plan- following with CXR now

## 2020-07-07 DIAGNOSIS — M79672 Pain in left foot: Secondary | ICD-10-CM | POA: Diagnosis not present

## 2020-07-07 DIAGNOSIS — L600 Ingrowing nail: Secondary | ICD-10-CM | POA: Diagnosis not present

## 2020-07-07 DIAGNOSIS — M79674 Pain in right toe(s): Secondary | ICD-10-CM | POA: Diagnosis not present

## 2020-07-07 DIAGNOSIS — M79671 Pain in right foot: Secondary | ICD-10-CM | POA: Diagnosis not present

## 2020-07-07 DIAGNOSIS — I739 Peripheral vascular disease, unspecified: Secondary | ICD-10-CM | POA: Diagnosis not present

## 2020-07-07 DIAGNOSIS — M722 Plantar fascial fibromatosis: Secondary | ICD-10-CM | POA: Diagnosis not present

## 2020-07-07 DIAGNOSIS — M79675 Pain in left toe(s): Secondary | ICD-10-CM | POA: Diagnosis not present

## 2020-07-14 ENCOUNTER — Other Ambulatory Visit: Payer: Self-pay | Admitting: Internal Medicine

## 2020-07-14 NOTE — Telephone Encounter (Signed)
CY please advise on refill of this medication:  Lorazepam 1 mg #180  With 1 refill given on 12/01/19  Last OV-06/24/2020 Next ov-12/26/2020  Please advise if ok to give additional refills of med. Thanks

## 2020-07-14 NOTE — Telephone Encounter (Signed)
Lorazepam refilled

## 2020-07-21 DIAGNOSIS — M79672 Pain in left foot: Secondary | ICD-10-CM | POA: Diagnosis not present

## 2020-07-21 DIAGNOSIS — M79675 Pain in left toe(s): Secondary | ICD-10-CM | POA: Diagnosis not present

## 2020-07-21 DIAGNOSIS — L600 Ingrowing nail: Secondary | ICD-10-CM | POA: Diagnosis not present

## 2020-07-21 DIAGNOSIS — L03032 Cellulitis of left toe: Secondary | ICD-10-CM | POA: Diagnosis not present

## 2020-08-01 ENCOUNTER — Ambulatory Visit: Payer: Medicare Other | Admitting: Neurology

## 2020-08-02 DIAGNOSIS — I10 Essential (primary) hypertension: Secondary | ICD-10-CM | POA: Diagnosis not present

## 2020-08-02 DIAGNOSIS — R7301 Impaired fasting glucose: Secondary | ICD-10-CM | POA: Diagnosis not present

## 2020-08-02 DIAGNOSIS — H401111 Primary open-angle glaucoma, right eye, mild stage: Secondary | ICD-10-CM | POA: Diagnosis not present

## 2020-08-02 DIAGNOSIS — E876 Hypokalemia: Secondary | ICD-10-CM | POA: Diagnosis not present

## 2020-08-04 DIAGNOSIS — L03032 Cellulitis of left toe: Secondary | ICD-10-CM | POA: Diagnosis not present

## 2020-08-04 DIAGNOSIS — M79675 Pain in left toe(s): Secondary | ICD-10-CM | POA: Diagnosis not present

## 2020-08-04 DIAGNOSIS — M79672 Pain in left foot: Secondary | ICD-10-CM | POA: Diagnosis not present

## 2020-08-04 DIAGNOSIS — L600 Ingrowing nail: Secondary | ICD-10-CM | POA: Diagnosis not present

## 2020-08-08 ENCOUNTER — Encounter: Payer: Self-pay | Admitting: *Deleted

## 2020-08-08 ENCOUNTER — Encounter: Payer: Self-pay | Admitting: Cardiology

## 2020-08-08 ENCOUNTER — Ambulatory Visit (INDEPENDENT_AMBULATORY_CARE_PROVIDER_SITE_OTHER): Payer: Medicare Other | Admitting: Cardiology

## 2020-08-08 VITALS — BP 138/80 | HR 75 | Ht 69.0 in | Wt 243.0 lb

## 2020-08-08 DIAGNOSIS — I10 Essential (primary) hypertension: Secondary | ICD-10-CM | POA: Diagnosis not present

## 2020-08-08 DIAGNOSIS — E782 Mixed hyperlipidemia: Secondary | ICD-10-CM | POA: Diagnosis not present

## 2020-08-08 DIAGNOSIS — I2510 Atherosclerotic heart disease of native coronary artery without angina pectoris: Secondary | ICD-10-CM

## 2020-08-08 DIAGNOSIS — I471 Supraventricular tachycardia: Secondary | ICD-10-CM | POA: Diagnosis not present

## 2020-08-08 DIAGNOSIS — I671 Cerebral aneurysm, nonruptured: Secondary | ICD-10-CM | POA: Diagnosis not present

## 2020-08-08 DIAGNOSIS — J61 Pneumoconiosis due to asbestos and other mineral fibers: Secondary | ICD-10-CM | POA: Diagnosis not present

## 2020-08-08 DIAGNOSIS — Z23 Encounter for immunization: Secondary | ICD-10-CM | POA: Diagnosis not present

## 2020-08-08 DIAGNOSIS — I350 Nonrheumatic aortic (valve) stenosis: Secondary | ICD-10-CM

## 2020-08-08 DIAGNOSIS — E871 Hypo-osmolality and hyponatremia: Secondary | ICD-10-CM | POA: Diagnosis not present

## 2020-08-08 DIAGNOSIS — N400 Enlarged prostate without lower urinary tract symptoms: Secondary | ICD-10-CM | POA: Diagnosis not present

## 2020-08-08 NOTE — Patient Instructions (Addendum)

## 2020-08-08 NOTE — Progress Notes (Signed)
Cardiology Office Note  Date: 08/08/2020   ID: AKIL HOOS, DOB Jul 22, 1935, MRN [id number]  PCP:  [name], MD  Cardiologist:  Rozann Lesches, MD Electrophysiologist:  None   Chief Complaint  Patient presents with  . Cardiac follow-up    History of Present Illness: Julian Fowler is an 85 y.o. male former patient of Dr. Bronson Ing now presenting to establish follow-up with me.  I reviewed his records and updated the chart.  He was last seen in September 2020.  He is here today with his wife.  He does not report any definite sense of palpitations or exertional chest pain.  He states that he has chronic troubles with balance, no recent falls.  Also chronic right leg swelling following an episode of severe cellulitis back in 2019.  He is looking into getting a recumbent elliptical machine to continue his exercise plan.  I personally reviewed his ECG today which shows normal sinus rhythm with increased voltage.  He states that he had recent lab work with Dr. Pleas Koch which we will request for review.  Echocardiogram from 2019 revealed LVEF 60 to 65% with mild diastolic dysfunction and mild calcific aortic stenosis.  Past Medical History:  Diagnosis Date  . Allergic rhinitis   . Aortic stenosis   . Arthritis   . BPH (benign prostatic hyperplasia)   . Coronary artery calcification seen on CT scan   . Easy bruisability   . ED (erectile dysfunction)   . Essential hypertension   . GERD (gastroesophageal reflux disease)   . H/O hiatal hernia   . Hearing loss in left ear   . History of asbestosis   . History of shingles   . Hyperlipidemia   . Overactive bladder   . PSVT (paroxysmal supraventricular tachycardia) ([place])   . Sleep apnea    No longer on CPAP following weight loss  . Tinnitus     Past Surgical History:  Procedure Laterality Date  . BACK SURGERY  2014   herniated L1, L2   Dr Carloyn Manner  . BRAIN SURGERY    . CEREBRAL EMBOLIZATION  12/2011   "radiation  therapy-did not work"  . CHOLECYSTECTOMY  6/98  . COLONOSCOPY  09/2009   Dr. Lindalou Hose: normal, internal hemorrhoids   . COLONOSCOPY N/A 02/28/2017   Dr. Gala Romney: Hemorrhoids, grade 3, mild diverticulosis.  Marland Kitchen CRANIECTOMY FOR EXCISION OF ACOUSTIC NEUROMA  3/95  . MINOR AMPUTATION OF DIGIT Left 12/31/2018   Procedure: REVISION AMPUTATION OF LEFT INDEX FINGER, IRRIGATION AND DEBRIDEMENT LEFT INDEX FINGER;  Surgeon: Verner Mould, MD;  Location: [facility];  Service: Orthopedics;  Laterality: Left;  . RADIOLOGY WITH ANESTHESIA N/A 07/07/2014   Procedure: EMBOLIZATION;  Surgeon: Rob Hickman, MD;  Location: [facility];  Service: Radiology;  Laterality: N/A;  . TOTAL KNEE ARTHROPLASTY Left 07/06/2013   Procedure: LEFT TOTAL KNEE ARTHROPLASTY;  Surgeon: Gearlean Alf, MD;  Location: [facility];  Service: Orthopedics;  Laterality: Left;  . TOTAL KNEE ARTHROPLASTY Right 01/18/2014   Procedure: RIGHT TOTAL KNEE ARTHROPLASTY;  Surgeon: Gearlean Alf, MD;  Location: [facility];  Service: Orthopedics;  Laterality: Right;  . TRIGGER FINGER RELEASE  2003   (thumb) middle finger (2006)    Current Outpatient Medications  Medication Sig Dispense Refill  . acetaminophen (TYLENOL) 325 MG tablet Take 325 mg by mouth as needed.     Marland Kitchen azelastine (ASTELIN) 0.1 % nasal spray 1-2 puffs each nostril every 8 hours if needed for  drainage (Patient taking differently: as needed. 1-2 puffs each nostril every 8 hours if needed for drainage) 30 mL 12  . budesonide-formoterol (SYMBICORT) 160-4.5 MCG/ACT inhaler Inhale 2 puffs then rinse mouth twice daily 1 Inhaler 12  . Calcium Carb-Vit D-C-E-Mineral (OS-CAL ULTRA) 600 MG TABS Take 1 tablet by mouth daily.     . diclofenac Sodium (VOLTAREN) 1 % GEL APPLY A SMALL AMOUNT TO AFFECTED AREA 2 TO 3 TIMES DAILY    . diltiazem (CARTIA XT) 120 MG 24 hr capsule Take 1 capsule (120 mg total) by mouth 2 (two) times daily. 180 capsule 0  . esomeprazole (NEXIUM) 40 MG capsule Take 40 mg by  mouth every morning.     . fexofenadine (ALLEGRA) 180 MG tablet Take 180 mg by mouth as needed.     . fluticasone (FLONASE) 50 MCG/ACT nasal spray Place 2 sprays into both nostrils daily. (Patient taking differently: Place 2 sprays into both nostrils daily as needed for allergies. ) 18.2 g 12  . furosemide (LASIX) 20 MG tablet Take 1 tablet (20 mg total) by mouth as needed. (Patient taking differently: Take 20 mg by mouth as needed. 9/10 pt reports taking 20 mg once or twice weekly)    . hydrALAZINE (APRESOLINE) 50 MG tablet Take 1.5 tablets (75 mg total) by mouth 3 (three) times daily. 410 tablet 3  . latanoprost (XALATAN) 0.005 % ophthalmic solution Place 1 drop into both eyes at bedtime.     Marland Kitchen LORazepam (ATIVAN) 1 MG tablet Take 1 tablet by mouth three times daily as needed 180 tablet 1  . losartan (COZAAR) 100 MG tablet Take 100 mg by mouth every morning.     . Multiple Vitamins-Minerals (CENTRUM SILVER PO) Take 1 tablet by mouth daily.     . polyethylene glycol (MIRALAX / GLYCOLAX) packet Take 17 g by mouth daily.    . potassium chloride SA (KLOR-CON) 20 MEQ tablet Take 1 tablet by mouth twice daily 180 tablet 1  . pravastatin (PRAVACHOL) 40 MG tablet Take 1 tablet by mouth at bedtime.     Marland Kitchen PROAIR HFA 108 (90 Base) MCG/ACT inhaler INHALE 2 PUFFS BY MOUTH EVERY 6 HOURS AS NEEDED FOR WHEEZING FOR SHORTNESS OF BREATH 18 g 12  . sildenafil (REVATIO) 20 MG tablet Take 1-2 tablets by mouth as needed.  1  . Wheat Dextrin (BENEFIBER) POWD Take by mouth 2 (two) times daily. Takes 1 Tbsp twice daily    . Zinc 25 MG TABS Take by mouth.    . zolpidem (AMBIEN) 10 MG tablet Take 1 tablet by mouth at bedtime.      No current facility-administered medications for this visit.   Allergies:  Prednisone, Codeine, and Tape   ROS: No syncope.  Physical Exam: VS:  BP 138/80   Pulse 75   Ht 5\' 9"  (1.753 m)   Wt 243 lb (110.2 kg)   BMI 35.88 kg/m , BMI Body mass index is 35.88 kg/m.  Wt Readings from  Last 3 Encounters:  08/08/20 243 lb (110.2 kg)  06/24/20 247 lb 12.8 oz (112.4 kg)  06/23/20 250 lb (113.4 kg)    General: Patient appears comfortable at rest. HEENT: Conjunctiva and lids normal, wearing a mask. Neck: Supple, no elevated JVP or carotid bruits, no thyromegaly. Lungs: Clear to auscultation, nonlabored breathing at rest. Cardiac: Regular rate and rhythm, no S3, 3/6 systolic murmur, no pericardial rub. Abdomen: Soft, nontender, bowel sounds present. Extremities: Chronic appearing right lower leg edema, distal pulses  2+.  ECG:  An ECG dated 12/31/2018 was personally reviewed today and demonstrated:  Sinus rhythm with IVCD.  Recent Labwork:  No interval lab work for review today.  Other Studies Reviewed Today:  Echocardiogram 12/26/2017: - Left ventricle: The cavity size was normal. Wall thickness was  increased in a pattern of moderate LVH. Systolic function was  normal. The estimated ejection fraction was in the range of 60%  to 65%. Wall motion was normal; there were no regional wall  motion abnormalities. Doppler parameters are consistent with  abnormal left ventricular relaxation (grade 1 diastolic  dysfunction).  - Aortic valve: Mildly calcified leaflets. Morphologically, there  appears to be at least mild calcific aortic valvular stenosis.  - Mitral valve: Moderately calcified annulus. Normal thickness  leaflets.  Chest CT 06/23/2019: IMPRESSION: 1. Asbestos related pleural disease redemonstrated. No imaging findings to suggest asbestosis at this time. 2. Aortic atherosclerosis, in addition to left main and 3 vessel coronary artery disease. 3. There are calcifications of the aortic valve and mitral annulus. Echocardiographic correlation for evaluation of potential valvular dysfunction may be warranted if clinically indicated.  Aortic Atherosclerosis (ICD10-I70.0).  Assessment and Plan:  1.  PSVT, quiesced sent in terms of palpitations.   He is in sinus rhythm today by ECG.  Continue observation on Cartia XT.  2.  Aortic stenosis, mild by echocardiogram in 2019.  We will obtain a follow-up echocardiogram for reassessment, 3/6 systolic murmur on examination.  3.  Multivessel distribution coronary artery calcification by CT imaging in September of last year.  He reports no active angina symptoms.  Continue losartan and Pravachol.  Requesting interval lab work from Dr. Pleas Koch.  Medication Adjustments/Labs and Tests Ordered: Current medicines are reviewed at length with the patient today.  Concerns regarding medicines are outlined above.   Tests Ordered: Orders Placed This Encounter  Procedures  . EKG 12-Lead  . ECHOCARDIOGRAM COMPLETE    Medication Changes: No orders of the defined types were placed in this encounter.   Disposition:  Follow up 1 year in the [place] office.  Signed, Satira Sark, MD, [facility] 08/08/2020 11:03 AM    [facility] [place], [place], [place] [address] Phone: [phone]; Fax: [phone]

## 2020-08-09 DIAGNOSIS — J019 Acute sinusitis, unspecified: Secondary | ICD-10-CM | POA: Diagnosis not present

## 2020-08-09 DIAGNOSIS — Z6837 Body mass index (BMI) 37.0-37.9, adult: Secondary | ICD-10-CM | POA: Diagnosis not present

## 2020-08-10 ENCOUNTER — Ambulatory Visit: Payer: Medicare Other | Admitting: Urology

## 2020-08-24 ENCOUNTER — Other Ambulatory Visit: Payer: Self-pay | Admitting: Family Medicine

## 2020-08-26 ENCOUNTER — Ambulatory Visit: Payer: Medicare Other | Admitting: Urology

## 2020-08-29 DIAGNOSIS — Z23 Encounter for immunization: Secondary | ICD-10-CM | POA: Diagnosis not present

## 2020-09-01 ENCOUNTER — Other Ambulatory Visit: Payer: Self-pay | Admitting: *Deleted

## 2020-09-01 MED ORDER — POTASSIUM CHLORIDE CRYS ER 20 MEQ PO TBCR: 20.0000 meq | EXTENDED_RELEASE_TABLET | Freq: Two times a day (BID) | ORAL | 1 refills | Status: DC

## 2020-09-05 ENCOUNTER — Encounter: Payer: Self-pay | Admitting: Urology

## 2020-09-05 ENCOUNTER — Ambulatory Visit (INDEPENDENT_AMBULATORY_CARE_PROVIDER_SITE_OTHER): Payer: Medicare Other | Admitting: Urology

## 2020-09-05 ENCOUNTER — Other Ambulatory Visit: Payer: Self-pay

## 2020-09-05 VITALS — BP 152/74 | HR 71 | Temp 99.0°F | Ht 69.0 in | Wt 246.0 lb

## 2020-09-05 DIAGNOSIS — R972 Elevated prostate specific antigen [PSA]: Secondary | ICD-10-CM

## 2020-09-05 DIAGNOSIS — I2510 Atherosclerotic heart disease of native coronary artery without angina pectoris: Secondary | ICD-10-CM | POA: Diagnosis not present

## 2020-09-05 DIAGNOSIS — N5201 Erectile dysfunction due to arterial insufficiency: Secondary | ICD-10-CM | POA: Diagnosis not present

## 2020-09-05 DIAGNOSIS — N3281 Overactive bladder: Secondary | ICD-10-CM | POA: Diagnosis not present

## 2020-09-05 LAB — BLADDER SCAN AMB NON-IMAGING: Scan Result: 20

## 2020-09-05 MED ORDER — MIRABEGRON ER 25 MG PO TB24: 25.0000 mg | ORAL_TABLET | Freq: Every day | ORAL | 0 refills | Status: DC

## 2020-09-05 MED ORDER — TADALAFIL 20 MG PO TABS: 20.0000 mg | ORAL_TABLET | Freq: Every day | ORAL | 5 refills | Status: DC | PRN

## 2020-09-05 MED ORDER — ALFUZOSIN HCL ER 10 MG PO TB24: 10.0000 mg | ORAL_TABLET | Freq: Every day | ORAL | 11 refills | Status: DC

## 2020-09-05 NOTE — Progress Notes (Signed)
09/05/2020 3:49 PM   Julian Fowler 05/15/1935 [id number]  Referring provider: [name], MD [place],  [place] [address]  Urinary frequency  HPI: Mr Mowers is a 85yo here for evaluation of urinary frequency.  For the past 2-3 years he has noticed increased urinary frequency, urgency, and nocturia.  Stream fair. He has urinary frequency every hour. Nocturia 4-5x. No dysuria or hematuria. He has issues getting and maintaining an erection. He takes sildenafil 100mg  which failed to improve his ED  His records from [facility] are as follows: Elevated PSA-Established Patient  HPI: Julian Fowler is a 85 year-old male established patient who is here for follow up for further evaluation of his elevated PSA.  His last PSA was performed 04/12/2017. The last PSA value was 5.0.   He has had a prostate biopsy done. Patient does not have a family history of prostate cancer.   Initial presentation in January, 2017 with a PSA of 7.8. Previously, when he saw Dr. Karsten Ro in 2007 and 2008, his PSAs were 5 and 4. After his initial presentation, rechecks were around the 3 level. His PSA, rechecked in late January, 2017 was 5.95. TRUS/Bx 2.28.2017. Prostatic volume was 106 cc. PSAD 0.06. All 12 cores were negative for adenocarcinoma.   10.13.2020: He has not had a PSA checked in 2 years.       CC: I am having trouble with my erections.  HPI: His symptoms did begin gradually. His symptoms have been worse over the last year.   He does have difficulties achieving an erection. He does have problems maintaining his erections. His erections are straight. He has tried Viagra.   10.13.2020: He has tried sildenafil for his ED -- he states that this is no longer helping with his sx's.      CC: I have testicular pain.  HPI: He first noticed the pain approximately 07/07/2019.   10.13.2020: At the time he made this appointment he was having some mild testicular pain but this has since resolved.        CC: Frequency, Nocturia and Urgency  HPI: He generally urinates every 15 minutes in the daytime. He cannot sit through a two-hour movie without urinating.   He has nocturia 4-5 times per night.   10.13.2020: He returns today now c/o extremely frequent urination -- he endorses this as being around 15-20 times in a single hour. He is also having 4-5x nocturia. He states that his stream is quite strong and when he does pee, there is quite a large volume despite his frequency. He also notes that he has quite a bit of urgency. He reports drinking roughly 3-4 caffeinated drinks a day and many other sodas. He denies having been on medication for his prostate or urinary sx's.       PMH: Past Medical History:  Diagnosis Date  . Allergic rhinitis   . Aortic stenosis   . Arthritis   . BPH (benign prostatic hyperplasia)   . Coronary artery calcification seen on CT scan   . Easy bruisability   . ED (erectile dysfunction)   . Essential hypertension   . GERD (gastroesophageal reflux disease)   . H/O hiatal hernia   . Hearing loss in left ear   . History of asbestosis   . History of shingles   . Hyperlipidemia   . Overactive bladder   . PSVT (paroxysmal supraventricular tachycardia) ([place])   . Sleep apnea    No longer on CPAP  following weight loss  . Tinnitus     Surgical History: Past Surgical History:  Procedure Laterality Date  . BACK SURGERY  2014   herniated L1, L2   Dr Carloyn Manner  . BRAIN SURGERY    . CEREBRAL EMBOLIZATION  12/2011   "radiation therapy-did not work"  . CHOLECYSTECTOMY  6/98  . COLONOSCOPY  09/2009   Dr. Lindalou Hose: normal, internal hemorrhoids   . COLONOSCOPY N/A 02/28/2017   Dr. Gala Romney: Hemorrhoids, grade 3, mild diverticulosis.  Marland Kitchen CRANIECTOMY FOR EXCISION OF ACOUSTIC NEUROMA  3/95  . MINOR AMPUTATION OF DIGIT Left 12/31/2018   Procedure: REVISION AMPUTATION OF LEFT INDEX FINGER, IRRIGATION AND DEBRIDEMENT LEFT INDEX FINGER;  Surgeon: Verner Mould, MD;   Location: [facility];  Service: Orthopedics;  Laterality: Left;  . RADIOLOGY WITH ANESTHESIA N/A 07/07/2014   Procedure: EMBOLIZATION;  Surgeon: Rob Hickman, MD;  Location: [facility];  Service: Radiology;  Laterality: N/A;  . TOTAL KNEE ARTHROPLASTY Left 07/06/2013   Procedure: LEFT TOTAL KNEE ARTHROPLASTY;  Surgeon: Gearlean Alf, MD;  [facility];  Service: Orthopedics;  Laterality: Left;  . TOTAL KNEE ARTHROPLASTY Right 01/18/2014   Procedure: RIGHT TOTAL KNEE ARTHROPLASTY;  Surgeon: Gearlean Alf, MD;  [facility];  Service: Orthopedics;  Laterality: Right;  . TRIGGER FINGER RELEASE  2003   (thumb) middle finger (2006)    Home Medications:  Allergies as of 09/05/2020      Reactions   Prednisone    Pt is high functioning and out of sorts.   Codeine Other (See Comments)   REACTION: groggy   Tape Other (See Comments)   Skin tears, use paper tape      Medication List       Accurate as of September 05, 2020  3:49 PM. If you have any questions, ask your nurse or doctor.        acetaminophen 325 MG tablet Commonly known as: TYLENOL Take 325 mg by mouth as needed.   azelastine 0.1 % nasal spray Commonly known as: ASTELIN 1-2 puffs each nostril every 8 hours if needed for drainage What changed:   when to take this  reasons to take this   Benefiber Powd Take by mouth 2 (two) times daily. Takes 1 Tbsp twice daily   budesonide-formoterol 160-4.5 MCG/ACT inhaler Commonly known as: Symbicort Inhale 2 puffs then rinse mouth twice daily   CENTRUM SILVER PO Take 1 tablet by mouth daily.   diclofenac Sodium 1 % Gel Commonly known as: VOLTAREN APPLY A SMALL AMOUNT TO AFFECTED AREA 2 TO 3 TIMES DAILY   diltiazem 120 MG 24 hr capsule Commonly known as: CARDIZEM CD Take 1 capsule by mouth twice daily   esomeprazole 40 MG capsule Commonly known as: NEXIUM Take 40 mg by mouth every morning.   fexofenadine 180 MG tablet Commonly known as: ALLEGRA Take 180 mg by  mouth as needed.   fluticasone 50 MCG/ACT nasal spray Commonly known as: FLONASE Place 2 sprays into both nostrils daily. What changed:   when to take this  reasons to take this   furosemide 20 MG tablet Commonly known as: LASIX Take 1 tablet (20 mg total) by mouth as needed. What changed: additional instructions   hydrALAZINE 50 MG tablet Commonly known as: APRESOLINE Take 1.5 tablets (75 mg total) by mouth 3 (three) times daily.   latanoprost 0.005 % ophthalmic solution Commonly known as: XALATAN Place 1 drop into both eyes at bedtime.   LORazepam 1  MG tablet Commonly known as: ATIVAN Take 1 tablet by mouth three times daily as needed   losartan 100 MG tablet Commonly known as: COZAAR Take 100 mg by mouth every morning.   Os-Cal Ultra 600 MG Tabs Take 1 tablet by mouth daily.   polyethylene glycol 17 g packet Commonly known as: MIRALAX / GLYCOLAX Take 17 g by mouth daily.   potassium chloride SA 20 MEQ tablet Commonly known as: KLOR-CON Take 1 tablet (20 mEq total) by mouth 2 (two) times daily.   pravastatin 40 MG tablet Commonly known as: PRAVACHOL Take 1 tablet by mouth at bedtime.   ProAir HFA 108 (90 Base) MCG/ACT inhaler Generic drug: albuterol INHALE 2 PUFFS BY MOUTH EVERY 6 HOURS AS NEEDED FOR WHEEZING FOR SHORTNESS OF BREATH   sildenafil 20 MG tablet Commonly known as: REVATIO Take 1-2 tablets by mouth as needed.   Zinc 25 MG Tabs Take by mouth.   zolpidem 10 MG tablet Commonly known as: AMBIEN Take 1 tablet by mouth at bedtime.       Allergies:  Allergies  Allergen Reactions  . Prednisone     Pt is high functioning and out of sorts.  . Codeine Other (See Comments)    REACTION: groggy  . Tape Other (See Comments)    Skin tears, use paper tape    Family History: Family History  Problem Relation Age of Onset  . Cancer Father        oral cancer  . Breast cancer Mother   . Heart attack Mother   . Hypertension Sister         Bypass x4  . Alcohol abuse Sister   . Alzheimer's disease Sister   . Kidney disease Sister   . Colon cancer Neg Hx     Social History:  reports that he quit smoking about 33 years ago. His smoking use included cigarettes. He started smoking about 63 years ago. He has a 45.00 pack-year smoking history. He has never used smokeless tobacco. He reports that he does not drink alcohol and does not use drugs.  ROS: All other review of systems were reviewed and are negative except what is noted above in HPI  Physical Exam: BP (!) 152/74   Pulse 71   Temp 99 F (37.2 C)   Ht 5\' 9"  (1.753 m)   Wt 246 lb (111.6 kg)   BMI 36.33 kg/m   Constitutional:  Alert and oriented, No acute distress. HEENT: [place] AT, moist mucus membranes.  Trachea midline, no masses. Cardiovascular: No clubbing, cyanosis, or edema. Respiratory: Normal respiratory effort, no increased work of breathing. GI: Abdomen is soft, nontender, nondistended, no abdominal masses GU: No CVA tenderness. Circumcised phallus. No masses/lesions on penis, testis, scrotum. Prostate 40g smooth no nodules no induration.  Lymph: No cervical or inguinal lymphadenopathy. Skin: No rashes, bruises or suspicious lesions. Neurologic: Grossly intact, no focal deficits, moving all 4 extremities. Psychiatric: Normal mood and affect.  Laboratory Data: Lab Results  Component Value Date   WBC 11.6 (H) 12/31/2018   HGB 13.0 12/31/2018   HCT 39.6 12/31/2018   MCV 91.5 12/31/2018   PLT 389 12/31/2018    Lab Results  Component Value Date   CREATININE 0.69 12/31/2018    Lab Results  Component Value Date   PSA 3.60 01/06/2009   PSA 3.05 03/25/2007    No results found for: TESTOSTERONE  No results found for: HGBA1C  Urinalysis    Component Value Date/Time   COLORURINE  YELLOW 11/02/2018 0856   APPEARANCEUR HAZY (A) 11/02/2018 0856   LABSPEC 1.020 11/02/2018 0856   PHURINE 7.0 11/02/2018 0856   GLUCOSEU NEGATIVE 11/02/2018 0856   HGBUR  NEGATIVE 11/02/2018 0856   HGBUR negative 10/27/2008 0838   BILIRUBINUR NEGATIVE 11/02/2018 [place] 11/02/2018 0856   PROTEINUR 100 (A) 11/02/2018 0856   UROBILINOGEN 0.2 01/11/2014 1405   NITRITE NEGATIVE 11/02/2018 0856   LEUKOCYTESUR NEGATIVE 11/02/2018 0856    Lab Results  Component Value Date   BACTERIA NONE SEEN 11/02/2018    Pertinent Imaging:  No results found for this or any previous visit.  No results found for this or any previous visit.  No results found for this or any previous visit.  No results found for this or any previous visit.  No results found for this or any previous visit.  No results found for this or any previous visit.  No results found for this or any previous visit.  No results found for this or any previous visit.   Assessment & Plan:    1. Elevated PSA -PSA today - Urinalysis, Routine w reflex microscopic  2. Erectile dysfunction due to arterial insufficiency -tadalafil 20mg   3. Overactive bladder -we will trial mirabegron 25mg   - BLADDER SCAN AMB NON-IMAGING  4. BPH with weak stream -uroxatral 10mg    No follow-ups on file.  Nicolette Bang, MD  [facility]

## 2020-09-05 NOTE — Patient Instructions (Signed)

## 2020-09-05 NOTE — Progress Notes (Signed)
Bladder Scan Patient can void: 20 ml Performed By: Durenda Guthrie, LPN   Urological Symptom Review  Patient is experiencing the following symptoms: Frequent urination Get up at night to urinate Leakage of urine Stream starts and stops Erection problems (male only)   Review of Systems  Gastrointestinal (upper)  : Negative for upper GI symptoms  Gastrointestinal (lower) : Negative for lower GI symptoms  Constitutional : Negative for symptoms  Skin: Negative for skin symptoms  Eyes: Negative for eye symptoms  Ear/Nose/Throat : Sinus problems  Hematologic/Lymphatic: Easy bruising  Cardiovascular : Leg swelling  Respiratory : Negative for respiratory symptoms  Endocrine: Negative for endocrine symptoms  Musculoskeletal: Negative for musculoskeletal symptoms  Neurological: Negative for neurological symptoms  Psychologic: Negative for psychiatric symptoms

## 2020-09-06 ENCOUNTER — Ambulatory Visit (INDEPENDENT_AMBULATORY_CARE_PROVIDER_SITE_OTHER): Payer: Medicare Other

## 2020-09-06 DIAGNOSIS — I350 Nonrheumatic aortic (valve) stenosis: Secondary | ICD-10-CM

## 2020-09-06 LAB — URINALYSIS, ROUTINE W REFLEX MICROSCOPIC
Bilirubin, UA: NEGATIVE
Glucose, UA: NEGATIVE
Leukocytes,UA: NEGATIVE
Nitrite, UA: NEGATIVE
RBC, UA: NEGATIVE
Specific Gravity, UA: 1.02 (ref 1.005–1.030)
Urobilinogen, Ur: 0.2 mg/dL (ref 0.2–1.0)
pH, UA: 6.5 (ref 5.0–7.5)

## 2020-09-06 LAB — MICROSCOPIC EXAMINATION
Bacteria, UA: NONE SEEN
RBC, Urine: NONE SEEN /HPF (ref 0–2)
WBC, UA: NONE SEEN /HPF (ref 0–5)

## 2020-09-06 LAB — ECHOCARDIOGRAM COMPLETE
AR max vel: 1.97 cm2
AV Area VTI: 2.27 cm2
AV Area mean vel: 2.05 cm2
AV Mean grad: 9 mmHg
AV Peak grad: 17.6 mmHg
Ao pk vel: 2.1 m/s
Area-P 1/2: 2.36 cm2
Calc EF: 63.8 %
MV M vel: 2.45 m/s
MV Peak grad: 24 mmHg
S' Lateral: 1.68 cm
Single Plane A2C EF: 59.4 %
Single Plane A4C EF: 67.9 %

## 2020-09-06 LAB — PSA: Prostate Specific Ag, Serum: 3 ng/mL (ref 0.0–4.0)

## 2020-09-07 ENCOUNTER — Telehealth: Payer: Self-pay | Admitting: Cardiology

## 2020-09-07 ENCOUNTER — Telehealth: Payer: Self-pay | Admitting: *Deleted

## 2020-09-07 NOTE — Telephone Encounter (Signed)
Laurine Blazer, LPN  [id number]/[id number] 2:33 PM EST Back to Top    Notified, copy to pcp.

## 2020-09-07 NOTE — Telephone Encounter (Signed)
-----   Message from Satira Sark, MD sent at 09/06/2020  4:54 PM EST ----- Results reviewed.  LVEF remains normal and aortic stenosis still in mild range.  Continue with present follow-up plan.

## 2020-09-07 NOTE — Telephone Encounter (Signed)
Reports having a rash when echo was done from new medication he was taking. Advised that the medication would not affect his echo result.

## 2020-09-07 NOTE — Progress Notes (Signed)
Results sent via my chart and mailed.

## 2020-09-07 NOTE — Telephone Encounter (Signed)
New message    Patient had echo yesterday after starting new medications and he is having a bad reaction to the medication , will it effect the results of the echo.

## 2020-09-21 NOTE — Progress Notes (Signed)
Referring Provider: [name], MD Primary Care Physician:  [name], MD Primary GI Physician: Dr. Gala Romney  Chief Complaint  Patient presents with  . left side pain    Mostly after eating    HPI:   Julian Fowler is a 85 y.o. male presenting today with a history of suspected diverticulitis in October 2020 empirically treated with cipro and flagyl, hemorrhoids s/p banding, constipation, and GERD presenting today for left sided pain.   Patients wife was disappointed they were not seeing Dr. Gala Romney. Stated no one told them they would not see Dr. Gala Romney. Advised that he was no in the office today, but we can schedule future appointments with Dr. Gala Romney.   Left upper quadrant pain occurring mostly after eating, but not with all meals. May be worse if he eats too much or has pizza. Eating pizza 3 times a week. No pain currently. Will be 5-6/10. When it starts, it will last all evening. Doesn't take anything for the pain. No NSAIDs.   States this is the same pain he had when he saw Dr. Gala Romney last year. States the pain improved with antibiotics, but never completely resolved. May occur once a month, "like a little ache". Over the last coule of days, it has been a little more frequent.   No nausea or vomiting. Has GERD which seems to be doing ok with esomeprazole. Breakthrough symptoms maybe once a month. This is not associated with his left sided.   BMs daily. Taking fiber twice daily and miralax at bedtime. No diarrhea or constipation. No blood in the stool or black stool.   Past Medical History:  Diagnosis Date  . Allergic rhinitis   . Aortic stenosis   . Arthritis   . BPH (benign prostatic hyperplasia)   . Coronary artery calcification seen on CT scan   . Easy bruisability   . ED (erectile dysfunction)   . Essential hypertension   . GERD (gastroesophageal reflux disease)   . H/O hiatal hernia   . Hearing loss in left ear   . History of asbestosis   . History of  shingles   . Hyperlipidemia   . Overactive bladder   . PSVT (paroxysmal supraventricular tachycardia) ([place])   . Sleep apnea    No longer on CPAP following weight loss  . Tinnitus     Past Surgical History:  Procedure Laterality Date  . BACK SURGERY  2014   herniated L1, L2   Dr Carloyn Manner  . BRAIN SURGERY    . CEREBRAL EMBOLIZATION  12/2011   "radiation therapy-did not work"  . CHOLECYSTECTOMY  6/98  . COLONOSCOPY  09/2009   Dr. Lindalou Hose: normal, internal hemorrhoids   . COLONOSCOPY N/A 02/28/2017   Dr. Gala Romney: Hemorrhoids, grade 3, mild diverticulosis.  Marland Kitchen CRANIECTOMY FOR EXCISION OF ACOUSTIC NEUROMA  3/95  . MINOR AMPUTATION OF DIGIT Left 12/31/2018   Procedure: REVISION AMPUTATION OF LEFT INDEX FINGER, IRRIGATION AND DEBRIDEMENT LEFT INDEX FINGER;  Surgeon: Verner Mould, MD;  Location: [facility];  Service: Orthopedics;  Laterality: Left;  . RADIOLOGY WITH ANESTHESIA N/A 07/07/2014   Procedure: EMBOLIZATION;  Surgeon: Rob Hickman, MD;  Location: [facility];  Service: Radiology;  Laterality: N/A;  . TOTAL KNEE ARTHROPLASTY Left 07/06/2013   Procedure: LEFT TOTAL KNEE ARTHROPLASTY;  Surgeon: Gearlean Alf, MD;  [facility];  Service: Orthopedics;  Laterality: Left;  . TOTAL KNEE ARTHROPLASTY Right 01/18/2014   Procedure: RIGHT TOTAL KNEE ARTHROPLASTY;  Surgeon: Gearlean Alf, MD;  [facility];  Service: Orthopedics;  Laterality: Right;  . TRIGGER FINGER RELEASE  2003   (thumb) middle finger (2006)    Current Outpatient Medications  Medication Sig Dispense Refill  . acetaminophen (TYLENOL) 325 MG tablet Take 325 mg by mouth as needed.    Marland Kitchen alfuzosin (UROXATRAL) 10 MG 24 hr tablet Take 1 tablet (10 mg total) by mouth at bedtime. 30 tablet 11  . azelastine (ASTELIN) 0.1 % nasal spray 1-2 puffs each nostril every 8 hours if needed for drainage (Patient taking differently: as needed. 1-2 puffs each nostril every 8 hours if needed for drainage) 30 mL 12  .  budesonide-formoterol (SYMBICORT) 160-4.5 MCG/ACT inhaler Inhale 2 puffs then rinse mouth twice daily 1 Inhaler 12  . Calcium Carb-Vit D-C-E-Mineral (OS-CAL ULTRA) 600 MG TABS Take 1 tablet by mouth daily.     . diclofenac Sodium (VOLTAREN) 1 % GEL APPLY A SMALL AMOUNT TO AFFECTED AREA 2 TO 3 TIMES DAILY    . diltiazem (CARDIZEM CD) 120 MG 24 hr capsule Take 1 capsule by mouth twice daily 180 capsule 3  . esomeprazole (NEXIUM) 40 MG capsule Take 40 mg by mouth every morning.     . fexofenadine (ALLEGRA) 180 MG tablet Take 180 mg by mouth as needed.     . fluticasone (FLONASE) 50 MCG/ACT nasal spray Place 2 sprays into both nostrils daily. (Patient taking differently: Place 2 sprays into both nostrils daily as needed for allergies.) 18.2 g 12  . hydrALAZINE (APRESOLINE) 50 MG tablet Take 1.5 tablets (75 mg total) by mouth 3 (three) times daily. 410 tablet 3  . latanoprost (XALATAN) 0.005 % ophthalmic solution Place 1 drop into both eyes at bedtime.     Marland Kitchen LORazepam (ATIVAN) 1 MG tablet Take 1 tablet by mouth three times daily as needed 180 tablet 1  . losartan (COZAAR) 100 MG tablet Take 100 mg by mouth every morning.     . mirabegron ER (MYRBETRIQ) 25 MG TB24 tablet Take 1 tablet (25 mg total) by mouth daily. 30 tablet 0  . Multiple Vitamins-Minerals (CENTRUM SILVER PO) Take 1 tablet by mouth daily.     . polyethylene glycol (MIRALAX / GLYCOLAX) packet Take 17 g by mouth at bedtime.    . potassium chloride SA (KLOR-CON) 20 MEQ tablet Take 1 tablet (20 mEq total) by mouth 2 (two) times daily. 180 tablet 1  . pravastatin (PRAVACHOL) 40 MG tablet Take 1 tablet by mouth at bedtime.     Marland Kitchen PROAIR HFA 108 (90 Base) MCG/ACT inhaler INHALE 2 PUFFS BY MOUTH EVERY 6 HOURS AS NEEDED FOR WHEEZING FOR SHORTNESS OF BREATH 18 g 12  . sildenafil (REVATIO) 20 MG tablet Take 1-2 tablets by mouth as needed.  1  . tadalafil (CIALIS) 20 MG tablet Take 1 tablet (20 mg total) by mouth daily as needed. 20 tablet 5  . Wheat  Dextrin (BENEFIBER) POWD Take by mouth 2 (two) times daily. Takes 1 Tbsp twice daily    . Zinc 25 MG TABS Take by mouth.    . zolpidem (AMBIEN) 10 MG tablet Take 1 tablet by mouth at bedtime.     . furosemide (LASIX) 20 MG tablet Take 1 tablet (20 mg total) by mouth as needed. (Patient taking differently: Take 20 mg by mouth as needed. 9/10 pt reports taking 20 mg once or twice weekly)     No current facility-administered medications for this visit.    Allergies  as of 09/22/2020 - Review Complete 09/22/2020  Allergen Reaction Noted  . Prednisone  12/17/2017  . Codeine Other (See Comments)   . Tape Other (See Comments) 04/10/2012    Family History  Problem Relation Age of Onset  . Cancer Father        oral cancer  . Breast cancer Mother   . Heart attack Mother   . Hypertension Sister        Bypass x4  . Alcohol abuse Sister   . Alzheimer's disease Sister   . Kidney disease Sister   . Colon cancer Neg Hx     Social History   Socioeconomic History  . Marital status: Married    Spouse name: Not on file  . Number of children: Not on file  . Years of education: Not on file  . Highest education level: Not on file  Occupational History  . Not on file  Tobacco Use  . Smoking status: Former Smoker    Packs/day: 1.50    Years: 30.00    Pack years: 45.00    Types: Cigarettes    Start date: 04/24/1957    Quit date: 10/15/1986    Years since quitting: 33.9  . Smokeless tobacco: Never Used  Substance and Sexual Activity  . Alcohol use: No    Alcohol/week: 0.0 standard drinks    Comment: Used to drink heavily at times  . Drug use: No  . Sexual activity: Not on file  Other Topics Concern  . Not on file  Social History Narrative   Co-dependent relationship with his 48 year old son who has drug and financial problems   Recently married to [place] on 01/02/2014   Social Determinants of Health   Financial Resource Strain: Not on file  Food Insecurity: Not on file  Transportation  Needs: Not on file  Physical Activity: Not on file  Stress: Not on file  Social Connections: Not on file    Review of Systems: Gen: Denies fever, chills, cold or flu like symptoms. Occasional lightheadedness which is not new. No pre-syncope or syncope.  CV: Denies chest pain. Resp: Denies dyspnea. GI: See HPI Heme: See HPI  Physical Exam: BP (!) 159/76   Pulse 75   Temp (!) 96.9 F (36.1 C)   Ht 5\' 9"  (1.753 m)   Wt 247 lb 9.6 oz (112.3 kg)   BMI 36.56 kg/m  General:   Alert and oriented. No distress noted. Pleasant and cooperative.  Head:  Normocephalic and atraumatic. Eyes:  Conjuctiva clear without scleral icterus. Heart:  S1, S2 present without murmurs appreciated. Lungs:  Clear to auscultation bilaterally. No wheezes, rales, or rhonchi. No distress.  Abdomen:  Obese. +BS, soft. Mild TTP in LUQ. No rebound or guarding. No HSM although evaluation limited due to body habitus. No masses noted. Msk:  Symmetrical without gross deformities. Normal posture. Extremities:  With 1+ bilateral LE pitting edema. Neurologic:  Alert and  oriented x4 Psych: Normal mood and affect.

## 2020-09-22 ENCOUNTER — Encounter: Payer: Self-pay | Admitting: Gastroenterology

## 2020-09-22 ENCOUNTER — Other Ambulatory Visit: Payer: Self-pay

## 2020-09-22 ENCOUNTER — Ambulatory Visit (INDEPENDENT_AMBULATORY_CARE_PROVIDER_SITE_OTHER): Payer: Medicare Other | Admitting: Gastroenterology

## 2020-09-22 VITALS — BP 159/76 | HR 75 | Temp 96.9°F | Ht 69.0 in | Wt 247.6 lb

## 2020-09-22 DIAGNOSIS — R109 Unspecified abdominal pain: Secondary | ICD-10-CM | POA: Insufficient documentation

## 2020-09-22 DIAGNOSIS — R1012 Left upper quadrant pain: Secondary | ICD-10-CM | POA: Diagnosis not present

## 2020-09-22 NOTE — Patient Instructions (Addendum)
Please have labs and CT of your abdomen completed at [facility].   We will call you with result and further recommendations.   Continue taking Nexium 40 mg daily.  For now, please limit pizza, tomato sauces, and other citrus foods.  I also recommend you limit fried/fatty/greasy foods and spicy foods.   It was good to meet you today!   Aliene Altes, PA-C [facility]

## 2020-09-22 NOTE — Assessment & Plan Note (Signed)
85 y.o. male with chronic intermittent LUQ abdominal pain worsened by meals, most often pizza. Patient reports this is the same pain he had in October 2020 that Dr. Gala Romney treated with cipro and flagyl which improved his symptoms quite a bit, but he has continues with intermittent pain that seems to have been worsening over the last few days.No NSAIDs. No nausea, vomiting, constipation, brbpr, or melena. No unintentional weight loss. GERD is well controlled on Nexium daily. Patient is asking for a CT scan stating Dr. Gala Romney told them he would need one if his symptoms persisted. Abdominal exam with mild TTP in LUQ. Reviewed recent labs in October 2021 with LFTs within normal limits.   Suspect pain may very well be related to GERD/gastritis flares that are triggered by dietary habits. Can't rule out other etiologies including pancreatic etiology or smoldering diverticulitis.   Plan:  CT abdomen with contrast ASAP Lipase Continue Nexium 40 mg daily for now.  Limit pizza, tomato based products, citrus foods, and spicy foods.  Further recommendations to follow. Consider trial of Nexium BID for short course.   Patient would like to follow-up with Dr. Gala Romney in the future. They were unaware they were being scheduled with a PA today.

## 2020-09-23 LAB — LIPASE: Lipase: 43 U/L (ref 7–60)

## 2020-09-23 LAB — CREATININE, SERUM: Creat: 0.78 mg/dL (ref 0.70–1.11)

## 2020-09-23 NOTE — Progress Notes (Signed)
Cc'ed to pcp °

## 2020-09-26 DIAGNOSIS — I739 Peripheral vascular disease, unspecified: Secondary | ICD-10-CM | POA: Diagnosis not present

## 2020-09-26 DIAGNOSIS — M79672 Pain in left foot: Secondary | ICD-10-CM | POA: Diagnosis not present

## 2020-09-26 DIAGNOSIS — M79675 Pain in left toe(s): Secondary | ICD-10-CM | POA: Diagnosis not present

## 2020-09-26 DIAGNOSIS — L110 Acquired keratosis follicularis: Secondary | ICD-10-CM | POA: Diagnosis not present

## 2020-09-26 DIAGNOSIS — M79674 Pain in right toe(s): Secondary | ICD-10-CM | POA: Diagnosis not present

## 2020-09-26 DIAGNOSIS — M79671 Pain in right foot: Secondary | ICD-10-CM | POA: Diagnosis not present

## 2020-09-27 ENCOUNTER — Ambulatory Visit (HOSPITAL_COMMUNITY): Payer: Medicare Other

## 2020-09-28 ENCOUNTER — Ambulatory Visit (HOSPITAL_COMMUNITY)
Admission: RE | Admit: 2020-09-28 | Discharge: 2020-09-28 | Disposition: A | Discharge status: Home / Self Care | Payer: Medicare Other | Source: Ambulatory Visit | Attending: Gastroenterology | Admitting: Gastroenterology

## 2020-09-28 ENCOUNTER — Other Ambulatory Visit: Payer: Self-pay

## 2020-09-28 DIAGNOSIS — I774 Celiac artery compression syndrome: Secondary | ICD-10-CM | POA: Diagnosis not present

## 2020-09-28 DIAGNOSIS — K551 Chronic vascular disorders of intestine: Secondary | ICD-10-CM | POA: Diagnosis not present

## 2020-09-28 DIAGNOSIS — R1012 Left upper quadrant pain: Secondary | ICD-10-CM | POA: Diagnosis not present

## 2020-09-28 DIAGNOSIS — G8929 Other chronic pain: Secondary | ICD-10-CM | POA: Diagnosis not present

## 2020-09-28 DIAGNOSIS — I739 Peripheral vascular disease, unspecified: Secondary | ICD-10-CM | POA: Diagnosis not present

## 2020-09-28 MED ORDER — IOHEXOL 300 MG/ML  SOLN
100.0000 mL | Freq: Once | INTRAMUSCULAR | Status: AC | PRN
  Administered 2020-09-28: 15:00:00 100 mL via INTRAVENOUS

## 2020-09-29 DIAGNOSIS — H832X2 Labyrinthine dysfunction, left ear: Secondary | ICD-10-CM | POA: Diagnosis not present

## 2020-09-29 DIAGNOSIS — Z86018 Personal history of other benign neoplasm: Secondary | ICD-10-CM | POA: Diagnosis not present

## 2020-09-29 DIAGNOSIS — Q282 Arteriovenous malformation of cerebral vessels: Secondary | ICD-10-CM | POA: Diagnosis not present

## 2020-09-29 DIAGNOSIS — H608X1 Other otitis externa, right ear: Secondary | ICD-10-CM | POA: Diagnosis not present

## 2020-09-29 DIAGNOSIS — Z9889 Other specified postprocedural states: Secondary | ICD-10-CM | POA: Diagnosis not present

## 2020-09-29 DIAGNOSIS — H9311 Tinnitus, right ear: Secondary | ICD-10-CM | POA: Diagnosis not present

## 2020-09-29 DIAGNOSIS — Z974 Presence of external hearing-aid: Secondary | ICD-10-CM | POA: Diagnosis not present

## 2020-09-29 DIAGNOSIS — H6121 Impacted cerumen, right ear: Secondary | ICD-10-CM | POA: Diagnosis not present

## 2020-09-29 DIAGNOSIS — H90A21 Sensorineural hearing loss, unilateral, right ear, with restricted hearing on the contralateral side: Secondary | ICD-10-CM | POA: Diagnosis not present

## 2020-09-30 ENCOUNTER — Telehealth: Payer: Self-pay | Admitting: Internal Medicine

## 2020-09-30 ENCOUNTER — Other Ambulatory Visit: Payer: Self-pay

## 2020-09-30 DIAGNOSIS — R1012 Left upper quadrant pain: Secondary | ICD-10-CM

## 2020-09-30 NOTE — Telephone Encounter (Signed)
Pt's wife called to say that patient had a CT done the other day and something was found. She has concerns and wanted to speak with the nurse or Dr Gala Romney about it. Please call her at [phone]

## 2020-09-30 NOTE — Telephone Encounter (Signed)
Spoke with spouse. We discussed results for imaging and she is aware that our office will be setting up a specialized CT scan to eval the arteries that supply the organs in the abdomen. Plaque was seen in those arteries and needs further evaluation. Spouse understood better what needs to be evaluated and will await call to schedule CT scan.

## 2020-10-05 ENCOUNTER — Ambulatory Visit (HOSPITAL_COMMUNITY)
Admission: RE | Admit: 2020-10-05 | Discharge: 2020-10-05 | Disposition: A | Discharge status: Home / Self Care | Payer: Medicare Other | Source: Ambulatory Visit | Attending: Gastroenterology | Admitting: Gastroenterology

## 2020-10-05 ENCOUNTER — Other Ambulatory Visit: Payer: Self-pay

## 2020-10-05 DIAGNOSIS — R1012 Left upper quadrant pain: Secondary | ICD-10-CM | POA: Insufficient documentation

## 2020-10-05 DIAGNOSIS — K4020 Bilateral inguinal hernia, without obstruction or gangrene, not specified as recurrent: Secondary | ICD-10-CM | POA: Diagnosis not present

## 2020-10-05 DIAGNOSIS — I701 Atherosclerosis of renal artery: Secondary | ICD-10-CM | POA: Diagnosis not present

## 2020-10-05 DIAGNOSIS — N402 Nodular prostate without lower urinary tract symptoms: Secondary | ICD-10-CM | POA: Diagnosis not present

## 2020-10-05 DIAGNOSIS — N3289 Other specified disorders of bladder: Secondary | ICD-10-CM | POA: Diagnosis not present

## 2020-10-05 MED ORDER — IOHEXOL 350 MG/ML SOLN
100.0000 mL | Freq: Once | INTRAVENOUS | Status: AC | PRN
  Administered 2020-10-05: 14:00:00 100 mL via INTRAVENOUS

## 2020-10-06 ENCOUNTER — Other Ambulatory Visit: Payer: Self-pay

## 2020-10-06 DIAGNOSIS — N400 Enlarged prostate without lower urinary tract symptoms: Secondary | ICD-10-CM

## 2020-10-19 ENCOUNTER — Ambulatory Visit (INDEPENDENT_AMBULATORY_CARE_PROVIDER_SITE_OTHER): Payer: Medicare Other | Admitting: Urology

## 2020-10-19 ENCOUNTER — Encounter: Payer: Self-pay | Admitting: Urology

## 2020-10-19 ENCOUNTER — Other Ambulatory Visit: Payer: Self-pay

## 2020-10-19 VITALS — BP 164/78 | HR 71 | Temp 98.6°F | Ht 69.0 in | Wt 247.6 lb

## 2020-10-19 DIAGNOSIS — N5201 Erectile dysfunction due to arterial insufficiency: Secondary | ICD-10-CM

## 2020-10-19 DIAGNOSIS — R972 Elevated prostate specific antigen [PSA]: Secondary | ICD-10-CM | POA: Diagnosis not present

## 2020-10-19 DIAGNOSIS — N138 Other obstructive and reflux uropathy: Secondary | ICD-10-CM

## 2020-10-19 DIAGNOSIS — N401 Enlarged prostate with lower urinary tract symptoms: Secondary | ICD-10-CM | POA: Insufficient documentation

## 2020-10-19 DIAGNOSIS — R3912 Poor urinary stream: Secondary | ICD-10-CM | POA: Insufficient documentation

## 2020-10-19 DIAGNOSIS — N3281 Overactive bladder: Secondary | ICD-10-CM | POA: Diagnosis not present

## 2020-10-19 MED ORDER — SILDENAFIL CITRATE 100 MG PO TABS: 100.0000 mg | ORAL_TABLET | Freq: Every day | ORAL | 0 refills | Status: DC | PRN

## 2020-10-19 MED ORDER — TAMSULOSIN HCL 0.4 MG PO CAPS: 0.4000 mg | ORAL_CAPSULE | Freq: Every day | ORAL | 11 refills | Status: DC

## 2020-10-19 NOTE — Progress Notes (Signed)
10/19/2020 4:12 PM   Julian Fowler 02-19-1935 [id number]  Referring provider: [name], MD [place],  [place] [address]  Followup BPH and Erectile dysfunction  HPI: Mr Leverette is a 86yo here for followup for BPH and erectile dysfunction. Last visit he tried uroxatral 10mg  which failed to improve his urinary urgency, frequency, nocturia. He then tried mirabegron 25mg  daily which failed to improve his LUTS also. Stream is strong. His biggest complaint is urinary urgency.  He did not get the rx for tadalafil due to cost.    PMH: Past Medical History:  Diagnosis Date  . Allergic rhinitis   . Aortic stenosis   . Arthritis   . BPH (benign prostatic hyperplasia)   . Coronary artery calcification seen on CT scan   . Easy bruisability   . ED (erectile dysfunction)   . Essential hypertension   . GERD (gastroesophageal reflux disease)   . H/O hiatal hernia   . Hearing loss in left ear   . History of asbestosis   . History of shingles   . Hyperlipidemia   . Overactive bladder   . PSVT (paroxysmal supraventricular tachycardia) ([place])   . Sleep apnea    No longer on CPAP following weight loss  . Tinnitus     Surgical History: Past Surgical History:  Procedure Laterality Date  . BACK SURGERY  2014   herniated L1, L2   Dr Carloyn Manner  . BRAIN SURGERY    . CEREBRAL EMBOLIZATION  12/2011   "radiation therapy-did not work"  . CHOLECYSTECTOMY  6/98  . COLONOSCOPY  09/2009   Dr. Lindalou Hose: normal, internal hemorrhoids   . COLONOSCOPY N/A 02/28/2017   Dr. Gala Romney: Hemorrhoids, grade 3, mild diverticulosis.  Marland Kitchen CRANIECTOMY FOR EXCISION OF ACOUSTIC NEUROMA  3/95  . MINOR AMPUTATION OF DIGIT Left 12/31/2018   Procedure: REVISION AMPUTATION OF LEFT INDEX FINGER, IRRIGATION AND DEBRIDEMENT LEFT INDEX FINGER;  Surgeon: Verner Mould, MD;  Location: [facility];  Service: Orthopedics;  Laterality: Left;  . RADIOLOGY WITH ANESTHESIA N/A 07/07/2014   Procedure: EMBOLIZATION;  Surgeon:  Rob Hickman, MD;  Location: [facility];  Service: Radiology;  Laterality: N/A;  . TOTAL KNEE ARTHROPLASTY Left 07/06/2013   Procedure: LEFT TOTAL KNEE ARTHROPLASTY;  Surgeon: Gearlean Alf, MD;  [facility];  Service: Orthopedics;  Laterality: Left;  . TOTAL KNEE ARTHROPLASTY Right 01/18/2014   Procedure: RIGHT TOTAL KNEE ARTHROPLASTY;  Surgeon: Gearlean Alf, MD;  [facility];  Service: Orthopedics;  Laterality: Right;  . TRIGGER FINGER RELEASE  2003   (thumb) middle finger (2006)    Home Medications:  Allergies as of 10/19/2020      Reactions   Prednisone    Pt is high functioning and out of sorts.   Codeine Other (See Comments)   REACTION: groggy   Tape Other (See Comments)   Skin tears, use paper tape      Medication List       Accurate as of October 19, 2020  4:12 PM. If you have any questions, ask your nurse or doctor.        acetaminophen 325 MG tablet Commonly known as: TYLENOL Take 325 mg by mouth as needed.   alfuzosin 10 MG 24 hr tablet Commonly known as: UROXATRAL Take 1 tablet (10 mg total) by mouth at bedtime.   azelastine 0.1 % nasal spray Commonly known as: ASTELIN 1-2 puffs each nostril every 8 hours if needed for drainage  What changed:   when to take this  reasons to take this   Benefiber Powd Take by mouth 2 (two) times daily. Takes 1 Tbsp twice daily   budesonide-formoterol 160-4.5 MCG/ACT inhaler Commonly known as: Symbicort Inhale 2 puffs then rinse mouth twice daily   CENTRUM SILVER PO Take 1 tablet by mouth daily.   diclofenac Sodium 1 % Gel Commonly known as: VOLTAREN APPLY A SMALL AMOUNT TO AFFECTED AREA 2 TO 3 TIMES DAILY   diltiazem 120 MG 24 hr capsule Commonly known as: CARDIZEM CD Take 1 capsule by mouth twice daily   esomeprazole 40 MG capsule Commonly known as: NEXIUM Take 40 mg by mouth every morning.   fexofenadine 180 MG tablet Commonly known as: ALLEGRA Take 180 mg by mouth as needed.   fluticasone  50 MCG/ACT nasal spray Commonly known as: FLONASE Place 2 sprays into both nostrils daily. What changed:   when to take this  reasons to take this   furosemide 20 MG tablet Commonly known as: LASIX Take 1 tablet (20 mg total) by mouth as needed. What changed: additional instructions   hydrALAZINE 50 MG tablet Commonly known as: APRESOLINE Take 1.5 tablets (75 mg total) by mouth 3 (three) times daily.   latanoprost 0.005 % ophthalmic solution Commonly known as: XALATAN Place 1 drop into both eyes at bedtime.   LORazepam 1 MG tablet Commonly known as: ATIVAN Take 1 tablet by mouth three times daily as needed   losartan 100 MG tablet Commonly known as: COZAAR Take 100 mg by mouth every morning.   mirabegron ER 25 MG Tb24 tablet Commonly known as: MYRBETRIQ Take 1 tablet (25 mg total) by mouth daily.   Os-Cal Ultra 600 MG Tabs Take 1 tablet by mouth daily.   polyethylene glycol 17 g packet Commonly known as: MIRALAX / GLYCOLAX Take 17 g by mouth at bedtime.   potassium chloride SA 20 MEQ tablet Commonly known as: KLOR-CON Take 1 tablet (20 mEq total) by mouth 2 (two) times daily.   pravastatin 40 MG tablet Commonly known as: PRAVACHOL Take 1 tablet by mouth at bedtime.   ProAir HFA 108 (90 Base) MCG/ACT inhaler Generic drug: albuterol INHALE 2 PUFFS BY MOUTH EVERY 6 HOURS AS NEEDED FOR WHEEZING FOR SHORTNESS OF BREATH   sildenafil 20 MG tablet Commonly known as: REVATIO Take 1-2 tablets by mouth as needed.   tadalafil 20 MG tablet Commonly known as: CIALIS Take 1 tablet (20 mg total) by mouth daily as needed.   Zinc 25 MG Tabs Take by mouth.   zolpidem 10 MG tablet Commonly known as: AMBIEN Take 1 tablet by mouth at bedtime.       Allergies:  Allergies  Allergen Reactions  . Prednisone     Pt is high functioning and out of sorts.  . Codeine Other (See Comments)    REACTION: groggy  . Tape Other (See Comments)    Skin tears, use paper tape     Family History: Family History  Problem Relation Age of Onset  . Cancer Father        oral cancer  . Breast cancer Mother   . Heart attack Mother   . Hypertension Sister        Bypass x4  . Alcohol abuse Sister   . Alzheimer's disease Sister   . Kidney disease Sister   . Colon cancer Neg Hx     Social History:  reports that he quit smoking about 34 years ago.  His smoking use included cigarettes. He started smoking about 63 years ago. He has a 45.00 pack-year smoking history. He has never used smokeless tobacco. He reports that he does not drink alcohol and does not use drugs.  ROS: All other review of systems were reviewed and are negative except what is noted above in HPI  Physical Exam: BP (!) 164/78   Pulse 71   Temp 98.6 F (37 C)   Ht 5\' 9"  (1.753 m)   Wt 247 lb 9.6 oz (112.3 kg)   BMI 36.56 kg/m   Constitutional:  Alert and oriented, No acute distress. HEENT: [place] AT, moist mucus membranes.  Trachea midline, no masses. Cardiovascular: No clubbing, cyanosis, or edema. Respiratory: Normal respiratory effort, no increased work of breathing. GI: Abdomen is soft, nontender, nondistended, no abdominal masses GU: No CVA tenderness.  Lymph: No cervical or inguinal lymphadenopathy. Skin: No rashes, bruises or suspicious lesions. Neurologic: Grossly intact, no focal deficits, moving all 4 extremities. Psychiatric: Normal mood and affect.  Laboratory Data: Lab Results  Component Value Date   WBC 11.6 (H) 12/31/2018   HGB 13.0 12/31/2018   HCT 39.6 12/31/2018   MCV 91.5 12/31/2018   PLT 389 12/31/2018    Lab Results  Component Value Date   CREATININE 0.78 09/22/2020    Lab Results  Component Value Date   PSA 3.60 01/06/2009   PSA 3.05 03/25/2007    No results found for: TESTOSTERONE  No results found for: HGBA1C  Urinalysis    Component Value Date/Time   COLORURINE YELLOW 11/02/2018 0856   APPEARANCEUR Clear 09/05/2020 1509   LABSPEC 1.020  11/02/2018 0856   PHURINE 7.0 11/02/2018 0856   GLUCOSEU Negative 09/05/2020 1509   HGBUR NEGATIVE 11/02/2018 0856   HGBUR negative 10/27/2008 0838   BILIRUBINUR Negative 09/05/2020 1509   [place] 11/02/2018 0856   PROTEINUR 1+ (A) 09/05/2020 1509   PROTEINUR 100 (A) 11/02/2018 0856   UROBILINOGEN 0.2 01/11/2014 1405   NITRITE Negative 09/05/2020 1509   NITRITE NEGATIVE 11/02/2018 0856   LEUKOCYTESUR Negative 09/05/2020 1509    Lab Results  Component Value Date   LABMICR See below: 09/05/2020   WBCUA None seen 09/05/2020   LABEPIT 0-10 09/05/2020   BACTERIA None seen 09/05/2020    Pertinent Imaging:  No results found for this or any previous visit.  No results found for this or any previous visit.  No results found for this or any previous visit.  No results found for this or any previous visit.  No results found for this or any previous visit.  No results found for this or any previous visit.  No results found for this or any previous visit.  No results found for this or any previous visit.   Assessment & Plan:    1. Erectile dysfunction due to arterial insufficiency -we will trial sildenafil 100mg  prn - POCT urinalysis dipstick  2. Benign prostatic hyperplasia with urinary obstruction -Flomax 0.4mg  daily  5. Weak urinary stream -flomax 0.4mg  daily   No follow-ups on file.  Nicolette Bang, MD  [facility] [place]

## 2020-10-19 NOTE — Progress Notes (Signed)
Urological Symptom Review  Patient is experiencing the following symptoms: Frequent urination Hard to postpone urination Get up at night to urinate Erection problems (male only)   Review of Systems  Gastrointestinal (upper)  : Negative for upper GI symptoms  Gastrointestinal (lower) : Negative for lower GI symptoms  Constitutional : Negative for symptoms  Skin: Itching  Eyes: Negative for eye symptoms  Ear/Nose/Throat : Negative for Ear/Nose/Throat symptoms  Hematologic/Lymphatic: Easy bruising  Cardiovascular : Leg swelling  Respiratory : Negative for respiratory symptoms  Endocrine: Negative for endocrine symptoms  Musculoskeletal: Negative for musculoskeletal symptoms  Neurological: Negative for neurological symptoms  Psychologic: Negative for psychiatric symptoms

## 2020-10-19 NOTE — Patient Instructions (Signed)

## 2020-10-20 ENCOUNTER — Ambulatory Visit (HOSPITAL_COMMUNITY): Payer: Medicare Other

## 2020-10-27 DIAGNOSIS — R059 Cough, unspecified: Secondary | ICD-10-CM | POA: Diagnosis not present

## 2020-11-03 DIAGNOSIS — E871 Hypo-osmolality and hyponatremia: Secondary | ICD-10-CM | POA: Diagnosis not present

## 2020-11-03 DIAGNOSIS — R972 Elevated prostate specific antigen [PSA]: Secondary | ICD-10-CM | POA: Diagnosis not present

## 2020-11-03 DIAGNOSIS — I10 Essential (primary) hypertension: Secondary | ICD-10-CM | POA: Diagnosis not present

## 2020-11-03 DIAGNOSIS — E876 Hypokalemia: Secondary | ICD-10-CM | POA: Diagnosis not present

## 2020-11-03 DIAGNOSIS — E7849 Other hyperlipidemia: Secondary | ICD-10-CM | POA: Diagnosis not present

## 2020-11-03 DIAGNOSIS — E782 Mixed hyperlipidemia: Secondary | ICD-10-CM | POA: Diagnosis not present

## 2020-11-03 DIAGNOSIS — R7301 Impaired fasting glucose: Secondary | ICD-10-CM | POA: Diagnosis not present

## 2020-11-07 ENCOUNTER — Telehealth: Payer: Self-pay | Admitting: *Deleted

## 2020-11-07 DIAGNOSIS — E7849 Other hyperlipidemia: Secondary | ICD-10-CM | POA: Diagnosis not present

## 2020-11-07 DIAGNOSIS — I701 Atherosclerosis of renal artery: Secondary | ICD-10-CM

## 2020-11-07 DIAGNOSIS — J4520 Mild intermittent asthma, uncomplicated: Secondary | ICD-10-CM | POA: Diagnosis not present

## 2020-11-07 DIAGNOSIS — I671 Cerebral aneurysm, nonruptured: Secondary | ICD-10-CM | POA: Diagnosis not present

## 2020-11-07 DIAGNOSIS — I10 Essential (primary) hypertension: Secondary | ICD-10-CM | POA: Diagnosis not present

## 2020-11-07 DIAGNOSIS — Z6837 Body mass index (BMI) 37.0-37.9, adult: Secondary | ICD-10-CM | POA: Diagnosis not present

## 2020-11-07 DIAGNOSIS — E871 Hypo-osmolality and hyponatremia: Secondary | ICD-10-CM | POA: Diagnosis not present

## 2020-11-07 DIAGNOSIS — J61 Pneumoconiosis due to asbestos and other mineral fibers: Secondary | ICD-10-CM | POA: Diagnosis not present

## 2020-11-07 NOTE — Telephone Encounter (Signed)
Julian Fowler called for Dr. Pleas Koch r/e CTA abd/pelvis result ordered by GI and result showing:  Renals: There is moderate to severe stenosis of the proximal right renal artery. There is mild narrowing of the left renal artery. There is an accessory left renal artery more inferiorly that demonstrates no significant stenosis.  Dr. Pleas Koch wanted opinion of cardiologist whether there should be any changes to current treatment regimen or need for further evaluation

## 2020-11-07 NOTE — Telephone Encounter (Signed)
Patient informed and verbalized understanding of plan. Copy sent to Dr. Pleas Koch for review and response.

## 2020-11-07 NOTE — Telephone Encounter (Signed)
Noted.  With reasonably controlled blood pressure and tolerating high-dose losartan with normal renal function, no strong indication to address the moderate to severe right renal artery stenosis at this time, particularly with no significant left-sided obstruction.  We should get renal arterial Dopplers for his next visit in 1 year.

## 2020-11-07 NOTE — Addendum Note (Signed)
Addended by: Merlene Laughter on: 11/07/2020 04:54 PM   Modules accepted: Orders

## 2020-11-14 DIAGNOSIS — Z6837 Body mass index (BMI) 37.0-37.9, adult: Secondary | ICD-10-CM | POA: Diagnosis not present

## 2020-11-14 DIAGNOSIS — N390 Urinary tract infection, site not specified: Secondary | ICD-10-CM | POA: Diagnosis not present

## 2020-11-14 DIAGNOSIS — I10 Essential (primary) hypertension: Secondary | ICD-10-CM | POA: Diagnosis not present

## 2020-11-14 DIAGNOSIS — E871 Hypo-osmolality and hyponatremia: Secondary | ICD-10-CM | POA: Diagnosis not present

## 2020-11-14 DIAGNOSIS — I701 Atherosclerosis of renal artery: Secondary | ICD-10-CM | POA: Diagnosis not present

## 2020-11-16 DIAGNOSIS — R0902 Hypoxemia: Secondary | ICD-10-CM | POA: Diagnosis not present

## 2020-11-16 DIAGNOSIS — R4020 Unspecified coma: Secondary | ICD-10-CM | POA: Diagnosis not present

## 2020-11-16 DIAGNOSIS — W19XXXA Unspecified fall, initial encounter: Secondary | ICD-10-CM | POA: Diagnosis not present

## 2020-11-16 DIAGNOSIS — R531 Weakness: Secondary | ICD-10-CM | POA: Diagnosis not present

## 2020-11-18 DIAGNOSIS — W19XXXA Unspecified fall, initial encounter: Secondary | ICD-10-CM | POA: Diagnosis not present

## 2020-11-18 DIAGNOSIS — R531 Weakness: Secondary | ICD-10-CM | POA: Diagnosis not present

## 2020-11-21 ENCOUNTER — Emergency Department (HOSPITAL_COMMUNITY): Payer: Medicare Other

## 2020-11-21 ENCOUNTER — Other Ambulatory Visit: Payer: Self-pay

## 2020-11-21 ENCOUNTER — Encounter (HOSPITAL_COMMUNITY): Payer: Self-pay | Admitting: Emergency Medicine

## 2020-11-21 ENCOUNTER — Inpatient Hospital Stay (HOSPITAL_COMMUNITY)
Admission: EM | Admit: 2020-11-21 | Discharge: 2020-11-28 | DRG: 641 | Disposition: A | Discharge status: Home Health | Payer: Medicare Other | Attending: Internal Medicine | Admitting: Internal Medicine

## 2020-11-21 DIAGNOSIS — E871 Hypo-osmolality and hyponatremia: Secondary | ICD-10-CM | POA: Diagnosis not present

## 2020-11-21 DIAGNOSIS — Z20822 Contact with and (suspected) exposure to covid-19: Secondary | ICD-10-CM | POA: Diagnosis present

## 2020-11-21 DIAGNOSIS — I10 Essential (primary) hypertension: Secondary | ICD-10-CM | POA: Diagnosis present

## 2020-11-21 DIAGNOSIS — G4733 Obstructive sleep apnea (adult) (pediatric): Secondary | ICD-10-CM | POA: Diagnosis present

## 2020-11-21 DIAGNOSIS — Z9049 Acquired absence of other specified parts of digestive tract: Secondary | ICD-10-CM

## 2020-11-21 DIAGNOSIS — J309 Allergic rhinitis, unspecified: Secondary | ICD-10-CM | POA: Diagnosis present

## 2020-11-21 DIAGNOSIS — R001 Bradycardia, unspecified: Secondary | ICD-10-CM | POA: Diagnosis not present

## 2020-11-21 DIAGNOSIS — I4891 Unspecified atrial fibrillation: Secondary | ICD-10-CM

## 2020-11-21 DIAGNOSIS — S2242XA Multiple fractures of ribs, left side, initial encounter for closed fracture: Secondary | ICD-10-CM | POA: Diagnosis not present

## 2020-11-21 DIAGNOSIS — R410 Disorientation, unspecified: Secondary | ICD-10-CM | POA: Diagnosis not present

## 2020-11-21 DIAGNOSIS — S2249XA Multiple fractures of ribs, unspecified side, initial encounter for closed fracture: Secondary | ICD-10-CM | POA: Diagnosis present

## 2020-11-21 DIAGNOSIS — Z87891 Personal history of nicotine dependence: Secondary | ICD-10-CM | POA: Diagnosis not present

## 2020-11-21 DIAGNOSIS — R531 Weakness: Secondary | ICD-10-CM | POA: Diagnosis not present

## 2020-11-21 DIAGNOSIS — Z888 Allergy status to other drugs, medicaments and biological substances status: Secondary | ICD-10-CM

## 2020-11-21 DIAGNOSIS — W19XXXA Unspecified fall, initial encounter: Secondary | ICD-10-CM | POA: Diagnosis not present

## 2020-11-21 DIAGNOSIS — Z7951 Long term (current) use of inhaled steroids: Secondary | ICD-10-CM

## 2020-11-21 DIAGNOSIS — S2232XA Fracture of one rib, left side, initial encounter for closed fracture: Secondary | ICD-10-CM | POA: Diagnosis not present

## 2020-11-21 DIAGNOSIS — Z885 Allergy status to narcotic agent status: Secondary | ICD-10-CM

## 2020-11-21 DIAGNOSIS — Z79899 Other long term (current) drug therapy: Secondary | ICD-10-CM | POA: Diagnosis not present

## 2020-11-21 DIAGNOSIS — R102 Pelvic and perineal pain: Secondary | ICD-10-CM | POA: Diagnosis not present

## 2020-11-21 DIAGNOSIS — M1990 Unspecified osteoarthritis, unspecified site: Secondary | ICD-10-CM | POA: Diagnosis present

## 2020-11-21 DIAGNOSIS — E876 Hypokalemia: Secondary | ICD-10-CM | POA: Diagnosis present

## 2020-11-21 DIAGNOSIS — N3281 Overactive bladder: Secondary | ICD-10-CM | POA: Diagnosis present

## 2020-11-21 DIAGNOSIS — N401 Enlarged prostate with lower urinary tract symptoms: Secondary | ICD-10-CM | POA: Diagnosis present

## 2020-11-21 DIAGNOSIS — Z8249 Family history of ischemic heart disease and other diseases of the circulatory system: Secondary | ICD-10-CM

## 2020-11-21 DIAGNOSIS — K5900 Constipation, unspecified: Secondary | ICD-10-CM | POA: Diagnosis present

## 2020-11-21 DIAGNOSIS — Z91048 Other nonmedicinal substance allergy status: Secondary | ICD-10-CM

## 2020-11-21 DIAGNOSIS — K219 Gastro-esophageal reflux disease without esophagitis: Secondary | ICD-10-CM | POA: Diagnosis present

## 2020-11-21 DIAGNOSIS — S3991XA Unspecified injury of abdomen, initial encounter: Secondary | ICD-10-CM | POA: Diagnosis not present

## 2020-11-21 DIAGNOSIS — E785 Hyperlipidemia, unspecified: Secondary | ICD-10-CM | POA: Diagnosis present

## 2020-11-21 DIAGNOSIS — J61 Pneumoconiosis due to asbestos and other mineral fibers: Secondary | ICD-10-CM | POA: Diagnosis present

## 2020-11-21 DIAGNOSIS — I350 Nonrheumatic aortic (valve) stenosis: Secondary | ICD-10-CM | POA: Diagnosis present

## 2020-11-21 DIAGNOSIS — R5381 Other malaise: Secondary | ICD-10-CM | POA: Diagnosis not present

## 2020-11-21 DIAGNOSIS — E869 Volume depletion, unspecified: Secondary | ICD-10-CM | POA: Diagnosis present

## 2020-11-21 DIAGNOSIS — I2510 Atherosclerotic heart disease of native coronary artery without angina pectoris: Secondary | ICD-10-CM | POA: Diagnosis present

## 2020-11-21 DIAGNOSIS — Z96653 Presence of artificial knee joint, bilateral: Secondary | ICD-10-CM | POA: Diagnosis present

## 2020-11-21 DIAGNOSIS — R296 Repeated falls: Secondary | ICD-10-CM | POA: Diagnosis present

## 2020-11-21 DIAGNOSIS — R062 Wheezing: Secondary | ICD-10-CM | POA: Diagnosis not present

## 2020-11-21 DIAGNOSIS — I517 Cardiomegaly: Secondary | ICD-10-CM | POA: Diagnosis not present

## 2020-11-21 DIAGNOSIS — I482 Chronic atrial fibrillation, unspecified: Secondary | ICD-10-CM

## 2020-11-21 DIAGNOSIS — W010XXA Fall on same level from slipping, tripping and stumbling without subsequent striking against object, initial encounter: Secondary | ICD-10-CM | POA: Diagnosis present

## 2020-11-21 LAB — COMPREHENSIVE METABOLIC PANEL
ALT: 69 U/L — ABNORMAL HIGH (ref 0–44)
AST: 33 U/L (ref 15–41)
Albumin: 3 g/dL — ABNORMAL LOW (ref 3.5–5.0)
Alkaline Phosphatase: 62 U/L (ref 38–126)
Anion gap: 10 (ref 5–15)
BUN: 17 mg/dL (ref 8–23)
CO2: 22 mmol/L (ref 22–32)
Calcium: 8.3 mg/dL — ABNORMAL LOW (ref 8.9–10.3)
Chloride: 79 mmol/L — ABNORMAL LOW (ref 98–111)
Creatinine, Ser: 1.01 mg/dL (ref 0.61–1.24)
GFR, Estimated: >60 mL/min (ref >60)
Glucose, Bld: 100 mg/dL — ABNORMAL HIGH (ref 70–99)
Potassium: 4 mmol/L (ref 3.5–5.1)
Sodium: 111 mmol/L — CL (ref 135–145)
Total Bilirubin: 0.7 mg/dL (ref 0.3–1.2)
Total Protein: 6.5 g/dL (ref 6.5–8.1)

## 2020-11-21 LAB — LIPASE, BLOOD: Lipase: 50 U/L (ref 11–51)

## 2020-11-21 LAB — CBC WITH DIFFERENTIAL/PLATELET
Abs Immature Granulocytes: 0.5 10*3/uL — ABNORMAL HIGH (ref 0.00–0.07)
Basophils Absolute: 0 10*3/uL (ref 0.0–0.1)
Basophils Relative: 0 %
Eosinophils Absolute: 0 10*3/uL (ref 0.0–0.5)
Eosinophils Relative: 0 %
HCT: 35.7 % — ABNORMAL LOW (ref 39.0–52.0)
Hemoglobin: 12.6 g/dL — ABNORMAL LOW (ref 13.0–17.0)
Lymphocytes Relative: 13 %
Lymphs Abs: 0.8 10*3/uL (ref 0.7–4.0)
MCH: 30.7 pg (ref 26.0–34.0)
MCHC: 35.3 g/dL (ref 30.0–36.0)
MCV: 86.9 fL (ref 80.0–100.0)
Metamyelocytes Relative: 4 %
Monocytes Absolute: 0.8 10*3/uL (ref 0.1–1.0)
Monocytes Relative: 12 %
Myelocytes: 1 %
Neutro Abs: 4.5 10*3/uL (ref 1.7–7.7)
Neutrophils Relative %: 70 %
Platelets: 426 10*3/uL — ABNORMAL HIGH (ref 150–400)
RBC: 4.11 MIL/uL — ABNORMAL LOW (ref 4.22–5.81)
RDW: 13.1 % (ref 11.5–15.5)
WBC: 6.4 10*3/uL (ref 4.0–10.5)
nRBC: 0 % (ref 0.0–0.2)

## 2020-11-21 LAB — URINALYSIS, ROUTINE W REFLEX MICROSCOPIC
Bilirubin Urine: NEGATIVE
Glucose, UA: NEGATIVE mg/dL
Hgb urine dipstick: NEGATIVE
Ketones, ur: NEGATIVE mg/dL
Leukocytes,Ua: NEGATIVE
Nitrite: NEGATIVE
Protein, ur: NEGATIVE mg/dL
Specific Gravity, Urine: 1.005 (ref 1.005–1.030)
pH: 7 (ref 5.0–8.0)

## 2020-11-21 LAB — TSH: TSH: 0.211 u[IU]/mL — ABNORMAL LOW (ref 0.350–4.500)

## 2020-11-21 LAB — TROPONIN I (HIGH SENSITIVITY)
Troponin I (High Sensitivity): 5 ng/L (ref <18)
Troponin I (High Sensitivity): 5 ng/L (ref <18)

## 2020-11-21 LAB — MAGNESIUM: Magnesium: 1.7 mg/dL (ref 1.7–2.4)

## 2020-11-21 LAB — SARS CORONAVIRUS 2 BY RT PCR (HOSPITAL ORDER, PERFORMED IN ~~LOC~~ HOSPITAL LAB): SARS Coronavirus 2: NEGATIVE

## 2020-11-21 LAB — SODIUM: Sodium: 115 mmol/L — CL (ref 135–145)

## 2020-11-21 LAB — SODIUM, URINE, RANDOM: Sodium, Ur: 38 mmol/L

## 2020-11-21 LAB — LACTIC ACID, PLASMA: Lactic Acid, Venous: 0.6 mmol/L (ref 0.5–1.9)

## 2020-11-21 LAB — OSMOLALITY, URINE: Osmolality, Ur: 242 mOsm/kg — ABNORMAL LOW (ref 300–900)

## 2020-11-21 MED ORDER — SODIUM CHLORIDE 0.9 % IV BOLUS
500.0000 mL | Freq: Once | INTRAVENOUS | Status: AC
  Administered 2020-11-21: 500 mL via INTRAVENOUS

## 2020-11-21 MED ORDER — LORAZEPAM 1 MG PO TABS
1.0000 mg | ORAL_TABLET | Freq: Every evening | ORAL | Status: DC | PRN
  Administered 2020-11-21 – 2020-11-27 (×6): 1 mg via ORAL
  Filled 2020-11-21 (×6): qty 1

## 2020-11-21 MED ORDER — ALFUZOSIN HCL ER 10 MG PO TB24
10.0000 mg | ORAL_TABLET | Freq: Every day | ORAL | Status: DC
  Administered 2020-11-21 – 2020-11-27 (×7): 10 mg via ORAL
  Filled 2020-11-21 (×9): qty 1

## 2020-11-21 MED ORDER — IOHEXOL 350 MG/ML SOLN: 100.0000 mL | Freq: Once | INTRAVENOUS | Status: DC | PRN

## 2020-11-21 MED ORDER — ALBUTEROL SULFATE HFA 108 (90 BASE) MCG/ACT IN AERS: 2.0000 | INHALATION_SPRAY | Freq: Four times a day (QID) | RESPIRATORY_TRACT | Status: DC | PRN

## 2020-11-21 MED ORDER — ONDANSETRON HCL 4 MG/2ML IJ SOLN
4.0000 mg | Freq: Four times a day (QID) | INTRAMUSCULAR | Status: DC | PRN
  Administered 2020-11-23 – 2020-11-24 (×2): 4 mg via INTRAVENOUS
  Filled 2020-11-21 (×3): qty 2

## 2020-11-21 MED ORDER — TAMSULOSIN HCL 0.4 MG PO CAPS
0.4000 mg | ORAL_CAPSULE | Freq: Every day | ORAL | Status: DC
  Administered 2020-11-22: 0.4 mg via ORAL
  Filled 2020-11-21: qty 1

## 2020-11-21 MED ORDER — ONDANSETRON HCL 4 MG PO TABS: 4.0000 mg | ORAL_TABLET | Freq: Four times a day (QID) | ORAL | Status: DC | PRN

## 2020-11-21 MED ORDER — ACETAMINOPHEN 650 MG RE SUPP: 650.0000 mg | Freq: Four times a day (QID) | RECTAL | Status: DC | PRN

## 2020-11-21 MED ORDER — LOSARTAN POTASSIUM 50 MG PO TABS
100.0000 mg | ORAL_TABLET | Freq: Every morning | ORAL | Status: DC
  Administered 2020-11-22 – 2020-11-28 (×7): 100 mg via ORAL
  Filled 2020-11-21 (×7): qty 2

## 2020-11-21 MED ORDER — ZOLPIDEM TARTRATE 5 MG PO TABS
5.0000 mg | ORAL_TABLET | Freq: Every evening | ORAL | Status: DC | PRN
  Administered 2020-11-23 – 2020-11-24 (×3): 5 mg via ORAL
  Filled 2020-11-21 (×3): qty 1

## 2020-11-21 MED ORDER — MOMETASONE FURO-FORMOTEROL FUM 200-5 MCG/ACT IN AERO
2.0000 | INHALATION_SPRAY | Freq: Two times a day (BID) | RESPIRATORY_TRACT | Status: DC
  Administered 2020-11-21 – 2020-11-28 (×13): 2 via RESPIRATORY_TRACT
  Filled 2020-11-21 (×2): qty 8.8

## 2020-11-21 MED ORDER — PRAVASTATIN SODIUM 40 MG PO TABS
40.0000 mg | ORAL_TABLET | Freq: Every day | ORAL | Status: DC
  Administered 2020-11-21 – 2020-11-27 (×7): 40 mg via ORAL
  Filled 2020-11-21 (×7): qty 1

## 2020-11-21 MED ORDER — SODIUM CHLORIDE 0.9 % IV SOLN: INTRAVENOUS | Status: DC

## 2020-11-21 MED ORDER — MIRABEGRON ER 25 MG PO TB24
25.0000 mg | ORAL_TABLET | Freq: Every day | ORAL | Status: DC
  Administered 2020-11-22 – 2020-11-23 (×2): 25 mg via ORAL
  Filled 2020-11-21 (×5): qty 1

## 2020-11-21 MED ORDER — HEPARIN BOLUS VIA INFUSION
4000.0000 [IU] | Freq: Once | INTRAVENOUS | Status: AC
  Administered 2020-11-21: 4000 [IU] via INTRAVENOUS

## 2020-11-21 MED ORDER — SODIUM CHLORIDE 3 % IV SOLN
INTRAVENOUS | Status: AC
  Filled 2020-11-21: qty 500

## 2020-11-21 MED ORDER — OXYCODONE HCL 5 MG PO TABS
5.0000 mg | ORAL_TABLET | ORAL | Status: DC | PRN
  Administered 2020-11-22 – 2020-11-24 (×4): 5 mg via ORAL
  Filled 2020-11-21 (×4): qty 1

## 2020-11-21 MED ORDER — IOHEXOL 300 MG/ML  SOLN
100.0000 mL | Freq: Once | INTRAMUSCULAR | Status: AC | PRN
  Administered 2020-11-21: 100 mL via INTRAVENOUS

## 2020-11-21 MED ORDER — HEPARIN (PORCINE) 25000 UT/250ML-% IV SOLN
1400.0000 [IU]/h | INTRAVENOUS | Status: DC
  Administered 2020-11-21 – 2020-11-23 (×4): 1550 [IU]/h via INTRAVENOUS
  Administered 2020-11-24: 1400 [IU]/h via INTRAVENOUS
  Filled 2020-11-21 (×5): qty 250

## 2020-11-21 MED ORDER — SODIUM CHLORIDE 3 % IV SOLN
INTRAVENOUS | Status: DC
  Filled 2020-11-21 (×2): qty 500

## 2020-11-21 MED ORDER — FUROSEMIDE 10 MG/ML IJ SOLN
20.0000 mg | Freq: Two times a day (BID) | INTRAMUSCULAR | Status: DC
  Administered 2020-11-21: 20 mg via INTRAVENOUS
  Filled 2020-11-21: qty 2

## 2020-11-21 MED ORDER — ACETAMINOPHEN 325 MG PO TABS
650.0000 mg | ORAL_TABLET | Freq: Four times a day (QID) | ORAL | Status: DC | PRN
  Administered 2020-11-23 – 2020-11-25 (×4): 650 mg via ORAL
  Filled 2020-11-21 (×4): qty 2

## 2020-11-21 MED ORDER — HYDRALAZINE HCL 25 MG PO TABS
75.0000 mg | ORAL_TABLET | Freq: Three times a day (TID) | ORAL | Status: DC
  Administered 2020-11-21 – 2020-11-24 (×8): 75 mg via ORAL
  Filled 2020-11-21 (×8): qty 3

## 2020-11-21 NOTE — ED Notes (Signed)
Placed male wick on pt.

## 2020-11-21 NOTE — Progress Notes (Signed)
ANTICOAGULATION CONSULT NOTE - Initial Consult   Pharmacy Consult for heparin gtt  Indication: atrial fibrillation  Allergies  Allergen Reactions  . Prednisone     Pt is high functioning and out of sorts.  . Codeine Other (See Comments)    REACTION: groggy  . Tape Other (See Comments)    Skin tears, use paper tape    Patient Measurements: Height: 5\' 9"  (175.3 cm) Weight: 112 kg (247 lb) IBW/kg (Calculated) : 70.7 Heparin Dosing Weight: HEPARIN DW (KG): 95.5   Vital Signs: Temp: 98.2 F (36.8 C) (02/07 1115) Temp Source: Oral (02/07 1115) BP: 147/70 (02/07 1930) Pulse Rate: 98 (02/07 1930)  Labs: Recent Labs    11/21/20 1247  HGB 12.6*  HCT 35.7*  PLT 426*  CREATININE 1.01    Estimated Creatinine Clearance: 68.3 mL/min (by C-G formula based on SCr of 1.01 mg/dL).   Medical History: Past Medical History:  Diagnosis Date  . Allergic rhinitis   . Aortic stenosis   . Arthritis   . BPH (benign prostatic hyperplasia)   . Coronary artery calcification seen on CT scan   . Easy bruisability   . ED (erectile dysfunction)   . Essential hypertension   . GERD (gastroesophageal reflux disease)   . H/O hiatal hernia   . Hearing loss in left ear   . History of asbestosis   . History of shingles   . Hyperlipidemia   . Overactive bladder   . PSVT (paroxysmal supraventricular tachycardia) ([place])   . Sleep apnea    No longer on CPAP following weight loss  . Tinnitus     Medications:  (Not in a hospital admission)  Scheduled:  . heparin  4,000 Units Intravenous Once   Infusions:  . heparin    . sodium chloride (hypertonic)     PRN: iohexol Anti-infectives (From admission, onward)   None      Assessment: Julian Fowler a 86 y.o. male requires anticoagulation with a heparin iv infusion for the indication of  atrial fibrillation. Heparin gtt will be started following pharmacy protocol per pharmacy consult. Patient is not on previous oral anticoagulant that  will require aPTT/HL correlation before transitioning to only HL monitoring.   Goal of Therapy:  Heparin level 0.3-0.7 units/ml Monitor platelets by anticoagulation protocol: Yes   Plan:  Give 4000 units bolus x 1 Start heparin infusion at 1550 units/hr Check anti-Xa level in 8 hours and daily while on heparin Continue to monitor H&H and platelets  Heparin level to be drawn in 8 hours for patients >57 years old   Donna Christen Julian Fowler 11/21/2020,8:10 PM

## 2020-11-21 NOTE — ED Provider Notes (Signed)
[facility] EMERGENCY DEPARTMENT Provider Note   CSN: [id number] Arrival date & time: 11/21/20  1054     History Chief Complaint  Patient presents with  . Weakness    Julian Fowler is a 86 y.o. male with past medical history significant for CAD, BPH, aortic stenosis, PSVT, overactive bladder who presents for evaluation of multiple complaints.  According to wife week and a half ago patient was noted to have a fever.  Had increasing weakness.  This occurred on a  Friday.  She took him to his PCP and was diagnosed with a UTI  on Monday.  Was placed on Bactrim.  At the time he had a mechanical fall and slipped and fell into the tub subsequently bruising to left chest and abdomen. Has had a chronic left upper abdominal pain.  States this feels different.  Wife states when he started the Bactrim he had some increased confusion when taking the bactrim, increased weakness, decreased p.o. intake. Wife states she has called EMS x2 over the last week to take him to the ED for repeat evaluation however she was told the ED had long waits they subsequently decided not to transfer him.  Has had additional falls since then however wife denies syncopal episodes.  States he did not take his Bactrim yesterday evening and his confusion has improved however still is mildly confused per wife.  Patient states he has pain to his left lower chest wall and his left upper abdomen where there is bruising.  He states he has had persistent polyuria.  States the color and odor of his urine has improved since being on the Bactrim.  When he was at his PCP last week he had some lab work drawn which showed hypokalemia and hyponatremia. He is on twice daily potassium supplementation.  Was supposed to go back today for repeat labs however PCP referred him to the ED for additional work-up.  Wife states he did fall and hit the right side of his head on a fall a few days ago.  Has had generalized weakness.  Has not noted any additional  fevers at home.  Patient denies any headaches, dizziness, neck pain, neck stiffness, exertional, pleuritic chest pain, hemoptysis, back pain, lower extremity pain.  Denies any paresthesias.  Denies additional aggravating or alleviating factors.   Tetanus up to date  History obtained from patient, wife in room and past medical records.  No interpreter used   Cardiology Dr. Domenic Polite- Follows for PSVT, Aortic stenosis, CAD Urology- [place]    HPI     Past Medical History:  Diagnosis Date  . Allergic rhinitis   . Aortic stenosis   . Arthritis   . BPH (benign prostatic hyperplasia)   . Coronary artery calcification seen on CT scan   . Easy bruisability   . ED (erectile dysfunction)   . Essential hypertension   . GERD (gastroesophageal reflux disease)   . H/O hiatal hernia   . Hearing loss in left ear   . History of asbestosis   . History of shingles   . Hyperlipidemia   . Overactive bladder   . PSVT (paroxysmal supraventricular tachycardia) ([place])   . Sleep apnea    No longer on CPAP following weight loss  . Tinnitus     Patient Active Problem List   Diagnosis Date Noted  . Acute hyponatremia 11/21/2020  . Erectile dysfunction due to arterial insufficiency 10/19/2020  . Elevated PSA 10/19/2020  . Benign prostatic hyperplasia with  urinary obstruction 10/19/2020  . Weak urinary stream 10/19/2020  . Abdominal pain 09/22/2020  . ILD (interstitial lung disease) ([place]) 12/23/2018  . Bronchitis 10/03/2018  . Acute sinusitis 10/03/2018  . Localized swelling of lower extremity 02/19/2018  . Asbestosis ([place]) 08/14/2017  . Rectal bleeding 01/24/2017  . Hemorrhoids 12/18/2016  . BRBPR (bright red blood per rectum) 12/18/2016  . 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 Peripheral edema 10/15/2013  . Hyponatremia 07/07/2013  . Hypokalemia 07/07/2013  . OA (osteoarthritis) of knee 07/06/2013  . Preop cardiovascular exam 06/26/2013  . Chronic insomnia  09/29/2012  . Cardiac murmur 08/27/2012  . Hearing impaired 04/10/2012  . Easy bruisability   . Anxiety   . Hyperlipidemia   . Abdominal aorta injury   . Overactive bladder   . ED (erectile dysfunction)   . SVT (supraventricular tachycardia) ([place])   . OSA (obstructive sleep apnea)   . Chest tightness   . Ejection fraction   . Essential hypertension 12/29/2009  . Syncope 09/14/2009  . TOBACCO ABUSE, HX OF 07/15/2009  . BACK PAIN 06/22/2009  . INTESTINAL GAS 06/22/2009  . 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 Seasonal and perennial allergic rhinitis 01/15/2008  . ECZEMA 11/18/2007  . Asthma, mild intermittent, well-controlled 11/03/2007  . GERD 04/14/2007  . ARTHRITIS 04/14/2007  . KNEE PAIN, LEFT 04/14/2007    Past Surgical History:  Procedure Laterality Date  . BACK SURGERY  2014   herniated L1, L2   Dr Carloyn Manner  . BRAIN SURGERY    . CEREBRAL EMBOLIZATION  12/2011   "radiation therapy-did not work"  . CHOLECYSTECTOMY  6/98  . COLONOSCOPY  09/2009   Dr. Lindalou Hose: normal, internal hemorrhoids   . COLONOSCOPY N/A 02/28/2017   Dr. Gala Romney: Hemorrhoids, grade 3, mild diverticulosis.  Marland Kitchen CRANIECTOMY FOR EXCISION OF ACOUSTIC NEUROMA  3/95  . MINOR AMPUTATION OF DIGIT Left 12/31/2018   Procedure: REVISION AMPUTATION OF LEFT INDEX FINGER, IRRIGATION AND DEBRIDEMENT LEFT INDEX FINGER;  Surgeon: Verner Mould, MD;  Location: [place];  Service: Orthopedics;  Laterality: Left;  . RADIOLOGY WITH ANESTHESIA N/A 07/07/2014   Procedure: EMBOLIZATION;  Surgeon: Rob Hickman, MD;  Location: [place];  Service: Radiology;  Laterality: N/A;  . TOTAL KNEE ARTHROPLASTY Left 07/06/2013   Procedure: LEFT TOTAL KNEE ARTHROPLASTY;  Surgeon: Gearlean Alf, MD;  Location: [facility];  Service: Orthopedics;  Laterality: Left;  . TOTAL KNEE ARTHROPLASTY Right 01/18/2014   Procedure: RIGHT TOTAL KNEE ARTHROPLASTY;  Surgeon: Gearlean Alf, MD;  Location: [facility];  Service: Orthopedics;   Laterality: Right;  . TRIGGER FINGER RELEASE  2003   (thumb) middle finger (2006)       Family History  Problem Relation Age of Onset  . Cancer Father        oral cancer  . Breast cancer Mother   . Heart attack Mother   . Hypertension Sister        Bypass x4  . Alcohol abuse Sister   . Alzheimer's disease Sister   . Kidney disease Sister   . Colon cancer Neg Hx     Social History   Tobacco Use  . Smoking status: Former Smoker    Packs/day: 1.50    Years: 30.00    Pack years: 45.00    Types: Cigarettes    Start date: 04/24/1957    Quit date: 10/15/1986    Years since quitting: 34.1  . Smokeless tobacco: Never Used  Substance  Use Topics  . Alcohol use: No    Alcohol/week: 0.0 standard drinks    Comment: Used to drink heavily at times  . Drug use: No    Home Medications Prior to Admission medications   Medication Sig Start Date End Date Taking? Authorizing Provider  acetaminophen (TYLENOL) 325 MG tablet Take 325 mg by mouth as needed.    [provider]  alfuzosin (UROXATRAL) 10 MG 24 hr tablet Take 1 tablet (10 mg total) by mouth at bedtime. 09/05/20   McKenzie, Candee Furbish, MD  azelastine (ASTELIN) 0.1 % nasal spray 1-2 puffs each nostril every 8 hours if needed for drainage Patient taking differently: as needed. 1-2 puffs each nostril every 8 hours if needed for drainage 12/17/17   Baird Lyons D, MD  budesonide-formoterol [facility]) 160-4.5 MCG/ACT inhaler Inhale 2 puffs then rinse mouth twice daily 12/23/19   Baird Lyons D, MD  Calcium Carb-Vit D-C-E-Mineral (OS-CAL ULTRA) 600 MG TABS Take 1 tablet by mouth daily.     [provider]  diclofenac Sodium (VOLTAREN) 1 % GEL APPLY A SMALL AMOUNT TO AFFECTED AREA 2 TO 3 TIMES DAILY 03/08/20   [provider]  diltiazem (CARDIZEM CD) 120 MG 24 hr capsule Take 1 capsule by mouth twice daily 08/24/20   Satira Sark, MD  esomeprazole (NEXIUM) 40 MG capsule Take 40 mg by mouth every morning.      [provider]  fexofenadine (ALLEGRA) 180 MG tablet Take 180 mg by mouth as needed.     [provider]  fluticasone (FLONASE) 50 MCG/ACT nasal spray Place 2 sprays into both nostrils daily. Patient taking differently: Place 2 sprays into both nostrils daily as needed for allergies. 05/03/16   Baird Lyons D, MD  furosemide (LASIX) 20 MG tablet Take 1 tablet (20 mg total) by mouth as needed. Patient taking differently: Take 20 mg by mouth as needed. 9/10 pt reports taking 20 mg once or twice weekly 12/27/17 08/08/20  Herminio Commons, MD  hydrALAZINE (APRESOLINE) 50 MG tablet Take 1.5 tablets (75 mg total) by mouth 3 (three) times daily. 01/01/20   Strader, Fransisco Hertz, PA-C  latanoprost (XALATAN) 0.005 % ophthalmic solution Place 1 drop into both eyes at bedtime.  09/01/12   [provider]  LORazepam (ATIVAN) 1 MG tablet Take 1 tablet by mouth three times daily as needed 07/14/20   Baird Lyons D, MD  losartan (COZAAR) 100 MG tablet Take 100 mg by mouth every morning.     [provider]  mirabegron ER (MYRBETRIQ) 25 MG TB24 tablet Take 1 tablet (25 mg total) by mouth daily. Patient not taking: Reported on 10/19/2020 09/05/20   Cleon Gustin, MD  Multiple Vitamins-Minerals (CENTRUM SILVER PO) Take 1 tablet by mouth daily.     [provider]  polyethylene glycol (MIRALAX / GLYCOLAX) packet Take 17 g by mouth at bedtime.    [provider]  potassium chloride SA (KLOR-CON) 20 MEQ tablet Take 1 tablet (20 mEq total) by mouth 2 (two) times daily. 09/01/20   Verta Ellen., NP  pravastatin (PRAVACHOL) 40 MG tablet Take 1 tablet by mouth at bedtime.  10/05/14   [provider]  PROAIR HFA 108 (90 Base) MCG/ACT inhaler INHALE 2 PUFFS BY MOUTH EVERY 6 HOURS AS NEEDED FOR WHEEZING FOR SHORTNESS OF BREATH 03/18/20   Baird Lyons D, MD  sildenafil (REVATIO) 20 MG tablet Take 1-2 tablets by mouth as needed. 11/23/17   [provider]  sildenafil (VIAGRA) 100 MG tablet Take 1 tablet (100 mg total) by mouth daily as needed for erectile dysfunction. 10/19/20   McKenzie, Candee Furbish, MD  tadalafil (CIALIS) 20 MG tablet Take 1 tablet (20 mg total) by mouth daily as needed. Patient not taking: Reported on 10/19/2020 09/05/20   Cleon Gustin, MD  tamsulosin (FLOMAX) 0.4 MG CAPS capsule Take 1 capsule (0.4 mg total) by mouth daily. 10/19/20   McKenzie, Candee Furbish, MD  Wheat Dextrin (BENEFIBER) POWD Take by mouth 2 (two) times daily. Takes 1 Tbsp twice daily    [provider]  Zinc 25 MG TABS Take by mouth.    [provider]  zolpidem (AMBIEN) 10 MG tablet Take 1 tablet by mouth at bedtime.  01/24/16   [provider]    Allergies    Prednisone, Codeine, and Tape  Review of Systems   Review of Systems  Constitutional: Positive for activity change, appetite change, chills and fatigue.  HENT: Negative.   Respiratory: Negative.   Cardiovascular: Positive for chest pain (left lower over ribs, bruising).  Gastrointestinal: Positive for abdominal pain (LU abd pain, chronic). Negative for abdominal distention, anal bleeding, blood in stool, constipation, diarrhea, nausea, rectal pain and vomiting.  Genitourinary: Positive for frequency and urgency. Negative for difficulty urinating and flank pain.  Musculoskeletal: Positive for gait problem. Negative for back pain, neck pain and neck stiffness.  Skin: Positive for color change (Bruising).  Neurological: Positive for weakness (Generalized). Negative for dizziness, facial asymmetry, light-headedness and headaches.  All other systems reviewed and are negative.   Physical Exam Updated Vital Signs BP (!) 147/70   Pulse 98   Temp 98.2 F (36.8 C) (Oral)   Resp 11   Ht 5\' 9"  (1.753 m)   Wt 112 kg   SpO2 98%   BMI 36.48 kg/m   Physical Exam Vitals and nursing note reviewed.  Constitutional:      General: He is not in acute distress.     Appearance: He is well-developed and well-nourished. He is ill-appearing (Chronically ill appearing). He is not toxic-appearing or diaphoretic.  HENT:     Head: Normocephalic and atraumatic. No raccoon eyes, Battle's sign, abrasion or contusion.     Jaw: There is normal jaw occlusion.      Comments: Tenderness over right lateral head. No hematoma    Right Ear: No hemotympanum.     Left Ear: No hemotympanum.     Nose: Nose normal.     Mouth/Throat:     Lips: Pink.     Mouth: Mucous membranes are dry.     Pharynx: Oropharynx is clear.  Eyes:     Extraocular Movements: Extraocular movements intact.     Conjunctiva/sclera: Conjunctivae normal.     Pupils: Pupils are equal, round, and reactive to light.     Comments: PERRLA  Neck:     Trachea: Trachea and phonation normal.  Cardiovascular:     Rate and Rhythm: Bradycardia present. Rhythm irregularly irregular.     Pulses: Normal pulses.          Radial pulses are 2+ on the right side and 2+ on the left side.       Dorsalis pedis pulses are 2+ on the right side and 2+ on the left side.     Heart sounds: Murmur heard.   Systolic murmur is present.     Comments: Systolic Murmur Pulmonary:     Effort: Pulmonary effort is normal.  No respiratory distress.     Breath sounds: Normal breath sounds and air entry.     Comments: Speaks in full sentences without difficulty. Clear to auscultation Chest:       Comments: Equal rise and fall to chest wall. Tenderness to anterior left lower ribs with overlying  Old ecchymosis Abdominal:     General: Bowel sounds are normal. There is no distension.     Palpations: Abdomen is soft.     Tenderness: There is abdominal tenderness in the left upper quadrant.       Comments: Tenderness left upper abdomen with some mild ecchymosis.  Musculoskeletal:        General: Normal range of motion.     Cervical back: Full passive range of motion without pain, normal range of motion and neck supple. Normal range  of motion.     Comments: No bony tenderness.  Moves bilateral upper and lower extremities at difficulty.  Compartments soft.  Lymphadenopathy:     Cervical: No cervical adenopathy.  Skin:    General: Skin is warm and dry.     Capillary Refill: Capillary refill takes 2 to 3 seconds.     Comments: Various abrasions and skin tears.  No surrounding erythema, warmth, fluctuance or induration  Neurological:     Mental Status: He is alert.     Comments: Mental Status:  Alert, thought content appropriate. Speech fluent without evidence of aphasia. Able to follow 2 step commands without difficulty.  Cranial Nerves:  II:  Peripheral visual fields grossly normal, pupils equal, round, reactive to light III,IV, VI: ptosis not present, extra-ocular motions intact bilaterally  V,VII: smile symmetric, facial light touch sensation equal VIII: hearing grossly normal bilaterally  IX,X: midline uvula rise  XI: bilateral shoulder shrug equal and strong XII: midline tongue extension  Motor:  5/5 in upper and lower extremities bilaterally including strong and equal grip strength and dorsiflexion/plantar flexion Sensory: Pinprick and light touch normal in all extremities.  Deep Tendon Reflexes: 2+ and symmetric  Cerebellar: normal finger-to-nose with bilateral upper extremities Gait: normal gait and balance CV: distal pulses palpable throughout    Psychiatric:        Mood and Affect: Mood and affect normal.    ED Results / Procedures / Treatments   Labs (all labs ordered are listed, but only abnormal results are displayed) Labs Reviewed  CBC WITH DIFFERENTIAL/PLATELET - Abnormal; Notable for the following components:      Result Value   RBC 4.11 (*)    Hemoglobin 12.6 (*)    HCT 35.7 (*)    Platelets 426 (*)    Abs Immature Granulocytes 0.50 (*)    All other components within normal limits  COMPREHENSIVE METABOLIC PANEL - Abnormal; Notable for the following components:   Sodium 111 (*)     Chloride 79 (*)    Glucose, Bld 100 (*)    Calcium 8.3 (*)    Albumin 3.0 (*)    ALT 69 (*)    All other components within normal limits  URINALYSIS, ROUTINE W REFLEX MICROSCOPIC - Abnormal; Notable for the following components:   Color, Urine STRAW (*)    All other components within normal limits  OSMOLALITY, URINE - Abnormal; Notable for the following components:   Osmolality, Ur 242 (*)    All other components within normal limits  CULTURE, BLOOD (ROUTINE X 2)  CULTURE, BLOOD (ROUTINE X 2)  SARS CORONAVIRUS 2 BY RT PCR (HOSPITAL ORDER, [place]  [facility])  URINE CULTURE  LIPASE, BLOOD  LACTIC ACID, PLASMA  SODIUM, URINE, RANDOM  TSH  CORTISOL  HEPARIN LEVEL (UNFRACTIONATED)  SODIUM  SODIUM  TROPONIN I (HIGH SENSITIVITY)  TROPONIN I (HIGH SENSITIVITY)    EKG EKG Interpretation  Date/Time:  Monday November 21 2020 11:33:23 EST Ventricular Rate:  75 PR Interval:    QRS Duration: 127 QT Interval:  420 QTC Calculation: 444 R Axis:   -20 Text Interpretation: Atrial fibrillation Ventricular premature complex IVCD, consider atypical RBBB afib is new. Confirmed by Davonna Belling [phone]) on 11/21/2020 1:49:17 PM   Radiology DG Pelvis 1-2 Views  Result Date: 11/21/2020 CLINICAL DATA:  Pelvic tenderness EXAM: PELVIS - 1-2 VIEW COMPARISON:  None. FINDINGS: There is no evidence of pelvic fracture or diastasis. No pelvic bone lesions are seen. Vascular calcifications noted IMPRESSION: No acute osseous abnormality. Electronically Signed   By: Suzy Bouchard M.D.   On: 11/21/2020 13:43   CT Head Wo Contrast  Result Date: 11/21/2020 CLINICAL DATA:  Being treated for UTI, increased weakness and falling. EXAM: CT HEAD WITHOUT CONTRAST CT CERVICAL SPINE WITHOUT CONTRAST TECHNIQUE: Multidetector CT imaging of the head and cervical spine was performed following the standard protocol without intravenous contrast. Multiplanar CT image reconstructions of the cervical  spine were also generated. COMPARISON:  CT head and cervical spine October 23, 2018. FINDINGS: CT HEAD FINDINGS Brain: No evidence of acute infarction, hemorrhage, hydrocephalus, extra-axial collection or mass lesion/mass effect. Similar global parenchymal volume loss with ex vacuo dilatation of ventricular system. Mild burden of chronic small vessel white matter disease. Vascular: No hyperdense vessel. Vascular calcifications. 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 Skull: Normal. Negative for fracture or focal lesion. Sinuses/Orbits: No acute finding. Other: None CT CERVICAL SPINE FINDINGS Alignment: Unchanged moderate kyphosis at C5. 3 mm anterolisthesis of C3 on C4, unchanged. Skull base and vertebrae: Negative for fracture Soft tissues and spinal canal: No prevertebral fluid or swelling. No visible canal hematoma. Atherosclerotic calcifications of the carotid arteries bilaterally. Disc levels: Multilevel disc and facet degeneration. Similar cervical spondylosis and spinal stenosis at C4-C5, C5-C6 and C6-C7. Upper chest: Negative Other: None IMPRESSION: 1. No acute intracranial pathology. Similar atrophy and chronic microvascular ischemic changes noted. 2. No acute fracture or static subluxation of the cervical spine. Similar cervical spondylosis as above. Electronically Signed   By: Dahlia Bailiff MD   On: 11/21/2020 17:48   CT Cervical Spine Wo Contrast  Result Date: 11/21/2020 CLINICAL DATA:  Being treated for UTI, increased weakness and falling. EXAM: CT HEAD WITHOUT CONTRAST CT CERVICAL SPINE WITHOUT CONTRAST TECHNIQUE: Multidetector CT imaging of the head and cervical spine was performed following the standard protocol without intravenous contrast. Multiplanar CT image reconstructions of the cervical spine were also generated. COMPARISON:  CT head and cervical spine October 23, 2018. FINDINGS: CT HEAD FINDINGS Brain: No evidence of acute infarction, hemorrhage, hydrocephalus,  extra-axial collection or mass lesion/mass effect. Similar global parenchymal volume loss with ex vacuo dilatation of ventricular system. Mild burden of chronic small vessel white matter disease. Vascular: No hyperdense vessel. Vascular calcifications. 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 Skull: Normal. Negative for fracture or focal lesion. Sinuses/Orbits: No acute finding. Other: None CT CERVICAL SPINE FINDINGS Alignment: Unchanged moderate kyphosis at C5. 3 mm anterolisthesis of C3 on C4, unchanged. Skull base and vertebrae: Negative for fracture Soft tissues and spinal canal: No prevertebral fluid or swelling. No visible canal hematoma. Atherosclerotic calcifications of the carotid arteries bilaterally.  Disc levels: Multilevel disc and facet degeneration. Similar cervical spondylosis and spinal stenosis at C4-C5, C5-C6 and C6-C7. Upper chest: Negative Other: None IMPRESSION: 1. No acute intracranial pathology. Similar atrophy and chronic microvascular ischemic changes noted. 2. No acute fracture or static subluxation of the cervical spine. Similar cervical spondylosis as above. Electronically Signed   By: Dahlia Bailiff MD   On: 11/21/2020 17:48   CT CHEST ABDOMEN PELVIS W CONTRAST  Result Date: 11/21/2020 CLINICAL DATA:  86 year old male with multiple falls and abdominal trauma. EXAM: CT CHEST, ABDOMEN, AND PELVIS WITH CONTRAST TECHNIQUE: Multidetector CT imaging of the chest, abdomen and pelvis was performed following the standard protocol during bolus administration of intravenous contrast. CONTRAST:  153mL OMNIPAQUE IOHEXOL 300 MG/ML  SOLN COMPARISON:  CT abdomen pelvis dated 10/05/2020. FINDINGS: CT CHEST FINDINGS Cardiovascular: Top-normal cardiac size. No pericardial effusion. Three-vessel coronary vascular calcification. Advanced atherosclerotic calcification of the thoracic aorta. No aneurysmal dilatation or dissection. The origins of the great vessels of the aortic arch  appear patent. The central pulmonary arteries are grossly unremarkable. Mediastinum/Nodes: No hilar or mediastinal adenopathy. The esophagus and the thyroid gland are grossly unremarkable. No mediastinal fluid collection. Lungs/Pleura: Bilateral calcified pleural plaques, likely sequela of prior asbestos exposure. No focal consolidation, pleural effusion, or pneumothorax. The central airways are patent. Musculoskeletal: Osteopenia with degenerative changes of the spine. Several nondisplaced left anterior rib fractures involving the third, fourth, fifth and, likely sixth ribs. CT ABDOMEN PELVIS FINDINGS No intra-abdominal free air or free fluid. Hepatobiliary: Subcentimeter left hepatic hypodense lesion is too small to characterize. The liver is otherwise unremarkable. No intrahepatic biliary ductal dilatation. Cholecystectomy. Pancreas: Unremarkable. No pancreatic ductal dilatation or surrounding inflammatory changes. Spleen: Normal in size without focal abnormality. Adrenals/Urinary Tract: The adrenal glands unremarkable. There is no hydronephrosis on either side. There is symmetric enhancement and excretion of contrast by both kidneys. The visualized ureters and urinary bladder appear unremarkable. Stomach/Bowel: Small scattered sigmoid diverticula without active inflammatory changes. There is no bowel obstruction or active inflammation. The appendix is normal. Vascular/Lymphatic: Advanced aortoiliac atherosclerotic disease. The IVC is unremarkable. No portal venous gas. There is no adenopathy. Reproductive: The prostate and seminal vesicles are grossly unremarkable. No pelvic masses Other: Small fat containing bilateral inguinal hernias. No fluid collection. Musculoskeletal: Osteopenia with degenerative changes of the spine. No acute osseous pathology. IMPRESSION: 1. Several nondisplaced left anterior rib fractures. No pneumothorax. 2. No acute/traumatic intra-abdominal or pelvic pathology. 3. Aortic  Atherosclerosis (ICD10-I70.0). Electronically Signed   By: Anner Crete M.D.   On: 11/21/2020 17:47   DG Chest Portable 1 View  Result Date: 11/21/2020 CLINICAL DATA:  Multiple falls. EXAM: PORTABLE CHEST 1 VIEW COMPARISON:  Chest x-ray 06/24/2020 FINDINGS: Mild cardiac enlargement. Moderate tortuosity and calcification of the thoracic aorta. 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 There is moderate eventration of the right hemidiaphragm. No infiltrates, edema or effusions. No pneumothorax. No acute bony findings.  No obvious acute rib fractures. IMPRESSION: 1. Mild cardiac enlargement. 2. 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 3. No acute cardiopulmonary findings. 4. Grossly intact bony thorax. Electronically Signed   By: Marijo Sanes M.D.   On: 11/21/2020 13:38    Procedures .Critical Care Performed by: Nettie Elm, PA-C Authorized by: Nettie Elm, PA-C   Critical care provider statement:    Critical care time (minutes):  76   Critical care was necessary to treat or prevent imminent or life-threatening deterioration of the  following conditions:  Dehydration, metabolic crisis and cardiac failure   Critical care was time spent personally by me on the following activities:  Discussions with consultants, evaluation of patient's response to treatment, examination of patient, ordering and performing treatments and interventions, ordering and review of laboratory studies, ordering and review of radiographic studies, pulse oximetry, re-evaluation of patient's condition, obtaining history from patient or surrogate and review of old charts     Medications Ordered in ED Medications  iohexol (OMNIPAQUE) 350 MG/ML injection 100 mL (has no administration in time range)  sodium chloride (hypertonic) 3 % solution (has no administration in time range)  heparin bolus via infusion 4,000  Units (has no administration in time range)  heparin ADULT infusion 100 units/mL (25000 units/282mL) (has no administration in time range)  sodium chloride 0.9 % bolus 500 mL (0 mLs Intravenous Stopped 11/21/20 1430)  sodium chloride 0.9 % bolus 500 mL (0 mLs Intravenous Stopped 11/21/20 1721)  iohexol (OMNIPAQUE) 300 MG/ML solution 100 mL (100 mLs Intravenous Contrast Given 11/21/20 1705)    ED Course  I have reviewed the triage vital signs and the nursing notes.  Pertinent labs & imaging results that were available during my care of the patient were reviewed by me and considered in my medical decision making (see chart for details).  86 year old presents for evaluation of weakness, multiple falls and apparent treatment for UTI.  On arrival patient is afebrile, nonseptic, not ill-appearing.  He has a nonfocal neuro exam without deficits.  Wife does state he seems a little bit more confused than normal.  States discharge when he started taking Bactrim.  Did not take his dose last night, wife feels like mentation has improved since stopping the Bactrim.  Has had multiple falls which wife relates to generalized weakness.  Has had EMS to the house multiple times subsequent falls.  Did hit his head a few days ago.  No obvious hematoma.  Does have some bruising to his left anterior chest wall as well as his left upper abdomen.  Apparently had some hyponatremia and hypokalemia on his labs by PCP 1 week ago.  Wife states his urinary symptoms have improved since he was on Bactrim.  We will plan on labs, imaging and reassess, small fluid bolus.  Labs and imaging personally reviewed and interpreted:  CBC without leukocytosis, Hgb 12.6 at baseline CMP hyponatremia at 111, glucose 100, ALT 69 no additional electrolyte, renal or liver abnormality Lipase 50 Trop 5 ---5, flat UA negative for infection Lactic 0.6 BC pending EKG with new slow Afib DG chest mild cardiac enlargement, No acute disease process or  traumatic injuries DG pelvis without acute findings COVID negative CT head without acute findings CT cervical without acute fingds CT C/A/P with #4 non displaced rib fractures. No abd findings.  Patient reassessed.  Discussed critical lab findings and need for admission.  He is agreeable for this.  Pending trauma scans given ecchymosis and pain to chest and abd. New onset slow afib.   CONSULT with Cards Dr. Marlou Porch who recommends stopping Cardizem. Maximize electrolytes. Patient does not need to be transferred to [facility] currently.  CONSULT with Dr. Clearence Ped who agrees to evaluate patient for admission.   Patient with critically low sodium and symptomatic bradycardia with new onset afib. Will be admitted for further management.  The patient appears reasonably stabilized for admission considering the current resources, flow, and capabilities available in the ED at this time, and I doubt any other [facility] requiring further screening  and/or treatment in the ED prior to admission.   Patient seen and evaluated by attending Dr. Alvino Chapel who is in agreement with above treatment, plan and disposition.    MDM Rules/Calculators/A&P                           Final Clinical Impression(s) / ED Diagnoses Final diagnoses:  Fall  Weakness  Symptomatic bradycardia  New onset a-fib (HCC)  Hyponatremia  Confusion  Closed fracture of multiple ribs of left side, initial encounter    Rx / DC Orders ED Discharge Orders    None       Taisley Mordan A, PA-C 11/21/20 2023    Davonna Belling, MD 11/22/20 1012

## 2020-11-21 NOTE — ED Triage Notes (Signed)
Pt is recently being treated for a UTI  Pt states increased weakness, falling and hypertension since placed on ABX

## 2020-11-21 NOTE — ED Notes (Signed)
Purewick leaked when pt was repositioned.

## 2020-11-21 NOTE — ED Notes (Signed)
Placed another male wick on pt. Pts bed cleaned due to last wick leak.

## 2020-11-21 NOTE — ED Notes (Signed)
hospitalist in room  

## 2020-11-21 NOTE — ED Notes (Signed)
Called AC for 3% NaCl
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
## 2020-11-21 NOTE — H&P (Signed)
TRH H&P    Patient Demographics:    Julian Fowler, is a 86 y.o. male  MRN: [id number]  DOB - [birth year]  Admit Date - 11/21/2020  Referring MD/NP/PA: Marcie Mowers  Outpatient Primary MD for the patient is [name], Virgina Evener, MD  Patient coming from: Home  Chief complaint- Generalized weakness and falls   HPI:    Julian Fowler  is a 86 y.o. male, with history of obstructive sleep apnea, paroxysmal supraventricular tachycardia, hyperlipidemia, chronic lung disease, GERD, hypertension, BPH, and more presents to the ED with a chief complaint of weakness and falls.  Wife reports that symptoms generally started 10 days ago.  She said he had fever and chills, and then 9 days ago he started have urinary retention and constipation.  Urinary retention was followed by urinary frequency with malodorous urine and dark-colored urine.  7 days ago they went to the PCP and Bactrim was prescribed.  Patient then developed nausea and vomiting which they attributed to Bactrim.  The same day that he went to the PCP he fell in the bathroom and hit the toilet on the way down hurting his left chest.  PCP called him the next day and said that his sodium was mildly low at 131 and he was supposed to get it redrawn today.  3 days ago patient had an acute change with increased generalized weakness decrease in appetite continued nausea and vomiting and another fall that was presyncopal.  Patient is usually able to ambulate with his walker, but wife has been having to hold his walker for him because he has been so weak.  He reports that for the last 2 days he has not been able to walk at all.  3 days ago when he had that acute change they called EMS.  EMS advised that hospitals have long wait times right now because of COVID, so they decided not to get transported.  Patient then became confused today and they decided to come into the ER.  Most of history is  collected from wife as patient is subtly confused about what has been going on over the past couple days.  He reports details that wife says are not accurate.  Patient does not smoke, does not drink.  He is vaccinated with booster for Covid.  Patient is full code.  In the ED Temp 98.2, heart rate 73, respiratory rate 11-21, blood pressure 153/68 White blood cell count 6.4, hemoglobin 12.6 Urine is not indicative of UTI Chemistry panel revealed a severe hyponatremia at 111, potassium is normal, chloride is also low at 72, bicarb 22, BUN 17 creatinine 1.01 -baseline creatinine 0.69 Albumin 3.0 Lactic acid was normal at 0.6 Blood cultures were drawn Covid negative EKG showed new onset A. fib with a heart rate of 75, QTc 444 Patient did have bradycardic episodes in the ED for which cardiology recommended holding Cardizem Due to falls there was CT of C-spine, head, chest abdomen and pelvis CT head showed no acute intracranial pathology.  Similar atrophy and ischemic changes noted.  CT C-spine  showed no acute fracture or static subluxation.  CT chest abdomen pelvis showed several nondisplaced left anterior rib fractures without pneumothorax Cardiology was consulted and advised that they will see him in the a.m. and to hold Cardizem ER gave 500 normal saline bolus x2 Admission requested for further management of acute hyponatremia     Review of systems:    In addition to the HPI above,  No Fever-chills, No Headache, No changes with Vision or hearing, No problems swallowing food or Liquids, No Chest pain, Cough or Shortness of Breath, No Abdominal pain, admits to nausea and vomiting, admits to constipation No Blood in stool or Urine, No dysuria, admits to urinary frequency and malodorous urine No new skin rashes or lesions No new joints pains-aches,  No new asymmetric weakness, tingling, numbness in any extremity, No recent weight gain or loss, No polyuria, polydypsia or polyphagia, No  significant Mental Stressors.  All other systems reviewed and are negative.    Past History of the following :    Past Medical History:  Diagnosis Date  . Allergic rhinitis   . Aortic stenosis   . Arthritis   . BPH (benign prostatic hyperplasia)   . Coronary artery calcification seen on CT scan   . Easy bruisability   . ED (erectile dysfunction)   . Essential hypertension   . GERD (gastroesophageal reflux disease)   . H/O hiatal hernia   . Hearing loss in left ear   . History of asbestosis   . History of shingles   . Hyperlipidemia   . Overactive bladder   . PSVT (paroxysmal supraventricular tachycardia) ([place])   . Sleep apnea    No longer on CPAP following weight loss  . Tinnitus       Past Surgical History:  Procedure Laterality Date  . BACK SURGERY  2014   herniated L1, L2   Dr Carloyn Manner  . BRAIN SURGERY    . CEREBRAL EMBOLIZATION  12/2011   "radiation therapy-did not work"  . CHOLECYSTECTOMY  6/98  . COLONOSCOPY  09/2009   Dr. Lindalou Hose: normal, internal hemorrhoids   . COLONOSCOPY N/A 02/28/2017   Dr. Gala Romney: Hemorrhoids, grade 3, mild diverticulosis.  Marland Kitchen CRANIECTOMY FOR EXCISION OF ACOUSTIC NEUROMA  3/95  . MINOR AMPUTATION OF DIGIT Left 12/31/2018   Procedure: REVISION AMPUTATION OF LEFT INDEX FINGER, IRRIGATION AND DEBRIDEMENT LEFT INDEX FINGER;  Surgeon: Verner Mould, MD;  Location: [facility];  Service: Orthopedics;  Laterality: Left;  . RADIOLOGY WITH ANESTHESIA N/A 07/07/2014   Procedure: EMBOLIZATION;  Surgeon: Rob Hickman, MD;  Location: [facility];  Service: Radiology;  Laterality: N/A;  . TOTAL KNEE ARTHROPLASTY Left 07/06/2013   Procedure: LEFT TOTAL KNEE ARTHROPLASTY;  Surgeon: Gearlean Alf, MD;  Location: [facility];  Service: Orthopedics;  Laterality: Left;  . TOTAL KNEE ARTHROPLASTY Right 01/18/2014   Procedure: RIGHT TOTAL KNEE ARTHROPLASTY;  Surgeon: Gearlean Alf, MD;  Location: [facility];  Service: Orthopedics;  Laterality: Right;  . TRIGGER FINGER  RELEASE  2003   (thumb) middle finger (2006)      Social History:      Social History   Tobacco Use  . Smoking status: Former Smoker    Packs/day: 1.50    Years: 30.00    Pack years: 45.00    Types: Cigarettes    Start date: 04/24/1957    Quit date: 10/15/1986    Years since quitting: 34.1  . Smokeless tobacco: Never Used  Substance Use Topics  .  Alcohol use: No    Alcohol/week: 0.0 standard drinks    Comment: Used to drink heavily at times       Family History :     Family History  Problem Relation Age of Onset  . Cancer Father        oral cancer  . Breast cancer Mother   . Heart attack Mother   . Hypertension Sister        Bypass x4  . Alcohol abuse Sister   . Alzheimer's disease Sister   . Kidney disease Sister   . Colon cancer Neg Hx      Home Medications:   Prior to Admission medications   Medication Sig Start Date End Date Taking? Authorizing Provider  acetaminophen (TYLENOL) 325 MG tablet Take 325 mg by mouth as needed.    [provider]  alfuzosin (UROXATRAL) 10 MG 24 hr tablet Take 1 tablet (10 mg total) by mouth at bedtime. 09/05/20   McKenzie, Candee Furbish, MD  azelastine (ASTELIN) 0.1 % nasal spray 1-2 puffs each nostril every 8 hours if needed for drainage Patient taking differently: as needed. 1-2 puffs each nostril every 8 hours if needed for drainage 12/17/17   Baird Lyons D, MD  budesonide-formoterol [facility]) 160-4.5 MCG/ACT inhaler Inhale 2 puffs then rinse mouth twice daily 12/23/19   Baird Lyons D, MD  Calcium Carb-Vit D-C-E-Mineral (OS-CAL ULTRA) 600 MG TABS Take 1 tablet by mouth daily.     [provider]  diclofenac Sodium (VOLTAREN) 1 % GEL APPLY A SMALL AMOUNT TO AFFECTED AREA 2 TO 3 TIMES DAILY 03/08/20   [provider]  diltiazem (CARDIZEM CD) 120 MG 24 hr capsule Take 1 capsule by mouth twice daily 08/24/20   Satira Sark, MD  esomeprazole (NEXIUM) 40 MG capsule Take 40 mg by mouth every morning.      [provider]  fexofenadine (ALLEGRA) 180 MG tablet Take 180 mg by mouth as needed.     [provider]  fluticasone (FLONASE) 50 MCG/ACT nasal spray Place 2 sprays into both nostrils daily. Patient taking differently: Place 2 sprays into both nostrils daily as needed for allergies. 05/03/16   Baird Lyons D, MD  furosemide (LASIX) 20 MG tablet Take 1 tablet (20 mg total) by mouth as needed. Patient taking differently: Take 20 mg by mouth as needed. 9/10 pt reports taking 20 mg once or twice weekly 12/27/17 08/08/20  Herminio Commons, MD  hydrALAZINE (APRESOLINE) 50 MG tablet Take 1.5 tablets (75 mg total) by mouth 3 (three) times daily. 01/01/20   Strader, Fransisco Hertz, PA-C  latanoprost (XALATAN) 0.005 % ophthalmic solution Place 1 drop into both eyes at bedtime.  09/01/12   [provider]  LORazepam (ATIVAN) 1 MG tablet Take 1 tablet by mouth three times daily as needed 07/14/20   Baird Lyons D, MD  losartan (COZAAR) 100 MG tablet Take 100 mg by mouth every morning.     [provider]  mirabegron ER (MYRBETRIQ) 25 MG TB24 tablet Take 1 tablet (25 mg total) by mouth daily. Patient not taking: Reported on 10/19/2020 09/05/20   Cleon Gustin, MD  Multiple Vitamins-Minerals (CENTRUM SILVER PO) Take 1 tablet by mouth daily.     [provider]  polyethylene glycol (MIRALAX / GLYCOLAX) packet Take 17 g by mouth at bedtime.    [provider]  potassium chloride SA (KLOR-CON) 20 MEQ tablet Take 1 tablet (20 mEq total) by mouth 2 (two) times  daily. 09/01/20   Netta Neat., NP  pravastatin (PRAVACHOL) 40 MG tablet Take 1 tablet by mouth at bedtime.  10/05/14   [provider]  PROAIR HFA 108 (90 Base) MCG/ACT inhaler INHALE 2 PUFFS BY MOUTH EVERY 6 HOURS AS NEEDED FOR WHEEZING FOR SHORTNESS OF BREATH 03/18/20   Jetty Duhamel D, MD  sildenafil (REVATIO) 20 MG tablet Take 1-2 tablets by mouth as needed. 11/23/17   [provider]  sildenafil (VIAGRA) 100 MG tablet Take 1 tablet (100 mg total) by mouth daily as needed for erectile dysfunction. 10/19/20   McKenzie, Mardene Celeste, MD  tadalafil (CIALIS) 20 MG tablet Take 1 tablet (20 mg total) by mouth daily as needed. Patient not taking: Reported on 10/19/2020 09/05/20   Malen Gauze, MD  tamsulosin (FLOMAX) 0.4 MG CAPS capsule Take 1 capsule (0.4 mg total) by mouth daily. 10/19/20   McKenzie, Mardene Celeste, MD  Wheat Dextrin (BENEFIBER) POWD Take by mouth 2 (two) times daily. Takes 1 Tbsp twice daily    [provider]  Zinc 25 MG TABS Take by mouth.    [provider]  zolpidem (AMBIEN) 10 MG tablet Take 1 tablet by mouth at bedtime.  01/24/16   [provider]     Allergies:     Allergies  Allergen Reactions  . Prednisone     Pt is high functioning and out of sorts.  . Codeine Other (See Comments)    REACTION: groggy  . Tape Other (See Comments)    Skin tears, use paper tape     Physical Exam:   Vitals  Blood pressure (!) 147/70, pulse 98, temperature 98.2 F (36.8 C), temperature source Oral, resp. rate 11, height 5\' 9"  (1.753 m), weight 112 kg, SpO2 98 %.  1.  General: Resting supine in bed with head of bed elevated  2. Psychiatric: Mood and behavior normal, oriented x 3, confused about past couple of days events - this is not baseline for him Cooperative with exam  3. Neurologic: Face symmetric, speech and language normal, moves all 4 extremities voluntarily  4. HEENMT:  Head is normocephalic, neck is supple, mucous membranes moist, Trachea is midline, Very hard of hearing with hearing aides, pupils reactive  5. Respiratory : Wheezing on exam, no crackles, no rhonchi, no cyanosis  6. Cardiovascular : Heart rate normal, rhythm irregularly irregular, no murmurs rubs or gallops  7. Gastrointestinal:  Abdomen is soft, non distended, and non tender to palpation with active bowel sounds  8. Skin:  Skin is  warm, dry, and intact on limited skin exam  9.Musculoskeletal:  Chest wall tenderness on left, no acute deformities, no calf tenderness, 2+ pitting edema in the R>L (chronic)    Data Review:    CBC Recent Labs  Lab 11/21/20 1247  WBC 6.4  HGB 12.6*  HCT 35.7*  PLT 426*  MCV 86.9  MCH 30.7  MCHC 35.3  RDW 13.1  LYMPHSABS 0.8  MONOABS 0.8  EOSABS 0.0  BASOSABS 0.0   ------------------------------------------------------------------------------------------------------------------  Results for orders placed or performed during the hospital encounter of 11/21/20 (from the past 48 hour(s))  Urinalysis, Routine w reflex microscopic Urine, Random     Status: Abnormal   Collection Time: 11/21/20 12:30 PM  Result Value Ref Range   Color, Urine STRAW (A) YELLOW   APPearance CLEAR CLEAR   Specific Gravity, Urine 1.005 1.005 - 1.030   pH 7.0 5.0 - 8.0   Glucose, UA NEGATIVE  NEGATIVE mg/dL   Hgb urine dipstick NEGATIVE NEGATIVE   Bilirubin Urine NEGATIVE NEGATIVE   Ketones, ur NEGATIVE NEGATIVE mg/dL   Protein, ur NEGATIVE NEGATIVE mg/dL   Nitrite NEGATIVE NEGATIVE   Leukocytes,Ua NEGATIVE NEGATIVE    Comment: Performed at [facility], [address], [place], [place] [address]  Blood culture (routine x 2)     Status: None (Preliminary result)   Collection Time: 11/21/20 12:46 PM   Specimen: BLOOD  Result Value Ref Range   Specimen Description BLOOD RIGHT ANTECUBITAL    Special Requests      BOTTLES DRAWN AEROBIC AND ANAEROBIC Blood Culture adequate volume Performed at [facility], [address], [place], [place] [address]    Culture PENDING    Report Status PENDING   CBC with Differential     Status: Abnormal   Collection Time: 11/21/20 12:47 PM  Result Value Ref Range   WBC 6.4 4.0 - 10.5 K/uL   RBC 4.11 (L) 4.22 - 5.81 MIL/uL   Hemoglobin 12.6 (L) 13.0 - 17.0 g/dL   HCT 35.7 (L) 39.0 - 52.0 %   MCV 86.9 80.0 - 100.0 fL   MCH 30.7 26.0 - 34.0 pg   MCHC 35.3 30.0 -  36.0 g/dL   RDW 13.1 11.5 - 15.5 %   Platelets 426 (H) 150 - 400 K/uL   nRBC 0.0 0.0 - 0.2 %   Neutrophils Relative % 70 %   Neutro Abs 4.5 1.7 - 7.7 K/uL   Lymphocytes Relative 13 %   Lymphs Abs 0.8 0.7 - 4.0 K/uL   Monocytes Relative 12 %   Monocytes Absolute 0.8 0.1 - 1.0 K/uL   Eosinophils Relative 0 %   Eosinophils Absolute 0.0 0.0 - 0.5 K/uL   Basophils Relative 0 %   Basophils Absolute 0.0 0.0 - 0.1 K/uL   WBC Morphology MILD LEFT SHIFT (1-5% METAS, OCC MYELO, OCC BANDS)     Comment: VACUOLATED NEUTROPHILS   Metamyelocytes Relative 4 %   Myelocytes 1 %   Abs Immature Granulocytes 0.50 (H) 0.00 - 0.07 K/uL   Abnormal Lymphocytes Present PRESENT     Comment: Performed at [facility], [address] [place],  [address]  Comprehensive metabolic panel     Status: Abnormal   Collection Time: 11/21/20 12:47 PM  Result Value Ref Range   Sodium 111 (LL) 135 - 145 mmol/L    Comment: CRITICAL RESULT CALLED TO, READ BACK BY AND VERIFIED WITH: MARTIN,D. RN @1350  11/21/20 BILLINGSLEY,L    Potassium 4.0 3.5 - 5.1 mmol/L   Chloride 79 (L) 98 - 111 mmol/L   CO2 22 22 - 32 mmol/L   Glucose, Bld 100 (H) 70 - 99 mg/dL    Comment: Glucose reference range applies only to samples taken after fasting for at least 8 hours.   BUN 17 8 - 23 mg/dL   Creatinine, Ser 1.01 0.61 - 1.24 mg/dL   Calcium 8.3 (L) 8.9 - 10.3 mg/dL   Total Protein 6.5 6.5 - 8.1 g/dL   Albumin 3.0 (L) 3.5 - 5.0 g/dL   AST 33 15 - 41 U/L   ALT 69 (H) 0 - 44 U/L   Alkaline Phosphatase 62 38 - 126 U/L   Total Bilirubin 0.7 0.3 - 1.2 mg/dL   GFR, Estimated >60 >60 mL/min    Comment: (NOTE) Calculated using the CKD-EPI Creatinine Equation (2021)    Anion gap 10 5 - 15    Comment: Performed at [facility], [address] [place], [place] [address]  Lipase, blood     Status: None   Collection Time: 11/21/20 12:47 PM  Result Value Ref Range   Lipase 50 11 - 51 U/L    Comment: Performed at [facility], [address], [place], [place] [address]  Troponin I (High Sensitivity)     Status: None   Collection Time: 11/21/20 12:47 PM  Result Value Ref Range   Troponin I (High Sensitivity) 5 <18 ng/L    Comment: (NOTE) Elevated high sensitivity troponin I (hsTnI) values and significant  changes across serial measurements may suggest ACS but many other  chronic and acute conditions are known to elevate hsTnI results.  Refer to the Links section for chest pain algorithms and additional  guidance. Performed at [facility], [address], [place], [place] [address]   Lactic acid, plasma     Status: None   Collection Time: 11/21/20 12:48 PM  Result Value Ref Range   Lactic Acid, Venous 0.6 0.5 - 1.9 mmol/L    Comment: Performed at [facility], [address], [place], [place] [address]  Blood culture (routine x 2)     Status: None (Preliminary result)   Collection Time: 11/21/20 12:54 PM   Specimen: BLOOD  Result Value Ref Range   Specimen Description BLOOD LEFT ANTECUBITAL    Special Requests      BOTTLES DRAWN AEROBIC AND ANAEROBIC Blood Culture adequate volume Performed at [facility], [address] [place], [place] [address]    Culture PENDING    Report Status PENDING   SARS Coronavirus 2 by RT PCR (hospital order, performed in [place] hospital lab) Nasopharyngeal Nasopharyngeal Swab     Status: None   Collection Time: 11/21/20  1:54 PM   Specimen: Nasopharyngeal Swab  Result Value Ref Range   SARS Coronavirus 2 NEGATIVE NEGATIVE    Comment: (NOTE) SARS-CoV-2 target nucleic acids are NOT DETECTED.  The SARS-CoV-2 RNA is generally detectable in upper and lower respiratory specimens during the acute phase of infection. The lowest concentration of SARS-CoV-2 viral copies this assay can detect is 250 copies / mL. A negative result does not preclude SARS-CoV-2 infection and should not be used as the sole basis for treatment or other patient management decisions.  A negative result may occur  with improper specimen collection / handling, submission of specimen other than nasopharyngeal swab, presence of viral mutation(s) within the areas targeted by this assay, and inadequate number of viral copies (<250 copies / mL). A negative result must be combined with clinical observations, patient history, and epidemiological information.  Fact Sheet for Patients:   StrictlyIdeas.no  Fact Sheet for Healthcare Providers: BankingDealers.co.za  This test is not yet approved or  cleared by the [place] FDA and has been authorized for detection and/or diagnosis of SARS-CoV-2 by FDA under an Emergency Use Authorization (EUA).  This EUA will remain in effect (meaning this test can be used) for the duration of the COVID-19 declaration under Section 564(b)(1) of the Act, 21 U.S.C. section 360bbb-3(b)(1), unless the authorization is terminated or revoked sooner.  Performed at [facility], [address], [place], [place] [address]   Sodium, urine, random     Status: None   Collection Time: 11/21/20  2:09 PM  Result Value Ref Range   Sodium, Ur 38 mmol/L    Comment: Performed at [facility], [address], [place], [place] [address]  Osmolality, urine     Status: Abnormal  Collection Time: 11/21/20  2:10 PM  Result Value Ref Range   Osmolality, Ur 242 (L) 300 - 900 mOsm/kg    Comment: REPEATED TO VERIFY Performed at [facility], [place] [address], [place], [place] [address]   Troponin I (High Sensitivity)     Status: None   Collection Time: 11/21/20  2:12 PM  Result Value Ref Range   Troponin I (High Sensitivity) 5 <18 ng/L    Comment: (NOTE) Elevated high sensitivity troponin I (hsTnI) values and significant  changes across serial measurements may suggest ACS but many other  chronic and acute conditions are known to elevate hsTnI results.  Refer to the "Links" section for chest pain algorithms and additional   guidance. Performed at [facility], [address] [place], [place] [address]     Chemistries  Recent Labs  Lab 11/21/20 1247  NA 111*  K 4.0  CL 79*  CO2 22  GLUCOSE 100*  BUN 17  CREATININE 1.01  CALCIUM 8.3*  AST 33  ALT 69*  ALKPHOS 62  BILITOT 0.7   ------------------------------------------------------------------------------------------------------------------  ------------------------------------------------------------------------------------------------------------------ GFR: Estimated Creatinine Clearance: 68.3 mL/min (by C-G formula based on SCr of 1.01 mg/dL). Liver Function Tests: Recent Labs  Lab 11/21/20 1247  AST 33  ALT 69*  ALKPHOS 62  BILITOT 0.7  PROT 6.5  ALBUMIN 3.0*   Recent Labs  Lab 11/21/20 1247  LIPASE 50   No results for input(s): AMMONIA in the last 168 hours. Coagulation Profile: No results for input(s): INR, PROTIME in the last 168 hours. Cardiac Enzymes: No results for input(s): CKTOTAL, CKMB, CKMBINDEX, TROPONINI in the last 168 hours. BNP (last 3 results) No results for input(s): PROBNP in the last 8760 hours. HbA1C: No results for input(s): HGBA1C in the last 72 hours. CBG: No results for input(s): GLUCAP in the last 168 hours. Lipid Profile: No results for input(s): CHOL, HDL, LDLCALC, TRIG, CHOLHDL, LDLDIRECT in the last 72 hours. Thyroid Function Tests: No results for input(s): TSH, T4TOTAL, FREET4, T3FREE, THYROIDAB in the last 72 hours. Anemia Panel: No results for input(s): VITAMINB12, FOLATE, FERRITIN, TIBC, IRON, RETICCTPCT in the last 72 hours.  --------------------------------------------------------------------------------------------------------------- Urine analysis:    Component Value Date/Time   COLORURINE STRAW (A) 11/21/2020 1230   APPEARANCEUR CLEAR 11/21/2020 1230   APPEARANCEUR Clear 09/05/2020 1509   LABSPEC 1.005 11/21/2020 1230   PHURINE 7.0 11/21/2020 1230   GLUCOSEU NEGATIVE  11/21/2020 1230   HGBUR NEGATIVE 11/21/2020 1230   HGBUR negative 10/27/2008 0838   BILIRUBINUR NEGATIVE 11/21/2020 1230   BILIRUBINUR Negative 09/05/2020 1509   KETONESUR NEGATIVE 11/21/2020 1230   PROTEINUR NEGATIVE 11/21/2020 1230   UROBILINOGEN 0.2 01/11/2014 1405   NITRITE NEGATIVE 11/21/2020 1230   LEUKOCYTESUR NEGATIVE 11/21/2020 1230      Imaging Results:    DG Pelvis 1-2 Views  Result Date: 11/21/2020 CLINICAL DATA:  Pelvic tenderness EXAM: PELVIS - 1-2 VIEW COMPARISON:  None. FINDINGS: There is no evidence of pelvic fracture or diastasis. No pelvic bone lesions are seen. Vascular calcifications noted IMPRESSION: No acute osseous abnormality. Electronically Signed   By: Suzy Bouchard M.D.   On: 11/21/2020 13:43   CT Head Wo Contrast  Result Date: 11/21/2020 CLINICAL DATA:  Being treated for UTI, increased weakness and falling. EXAM: CT HEAD WITHOUT CONTRAST CT CERVICAL SPINE WITHOUT CONTRAST TECHNIQUE: Multidetector CT imaging of the head and cervical spine was performed following the standard protocol without intravenous contrast. Multiplanar CT image reconstructions of the cervical spine were  also generated. COMPARISON:  CT head and cervical spine October 23, 2018. FINDINGS: CT HEAD FINDINGS Brain: No evidence of acute infarction, hemorrhage, hydrocephalus, extra-axial collection or mass lesion/mass effect. Similar global parenchymal volume loss with ex vacuo dilatation of ventricular system. Mild burden of chronic small vessel white matter disease. Vascular: No hyperdense vessel. Vascular calcifications. 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 Skull: Normal. Negative for fracture or focal lesion. Sinuses/Orbits: No acute finding. Other: None CT CERVICAL SPINE FINDINGS Alignment: Unchanged moderate kyphosis at C5. 3 mm anterolisthesis of C3 on C4, unchanged. Skull base and vertebrae: Negative for fracture Soft tissues and spinal canal: No prevertebral fluid or  swelling. No visible canal hematoma. Atherosclerotic calcifications of the carotid arteries bilaterally. Disc levels: Multilevel disc and facet degeneration. Similar cervical spondylosis and spinal stenosis at C4-C5, C5-C6 and C6-C7. Upper chest: Negative Other: None IMPRESSION: 1. No acute intracranial pathology. Similar atrophy and chronic microvascular ischemic changes noted. 2. No acute fracture or static subluxation of the cervical spine. Similar cervical spondylosis as above. Electronically Signed   By: Dahlia Bailiff MD   On: 11/21/2020 17:48   CT Cervical Spine Wo Contrast  Result Date: 11/21/2020 CLINICAL DATA:  Being treated for UTI, increased weakness and falling. EXAM: CT HEAD WITHOUT CONTRAST CT CERVICAL SPINE WITHOUT CONTRAST TECHNIQUE: Multidetector CT imaging of the head and cervical spine was performed following the standard protocol without intravenous contrast. Multiplanar CT image reconstructions of the cervical spine were also generated. COMPARISON:  CT head and cervical spine October 23, 2018. FINDINGS: CT HEAD FINDINGS Brain: No evidence of acute infarction, hemorrhage, hydrocephalus, extra-axial collection or mass lesion/mass effect. Similar global parenchymal volume loss with ex vacuo dilatation of ventricular system. Mild burden of chronic small vessel white matter disease. Vascular: No hyperdense vessel. Vascular calcifications. 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 Skull: Normal. Negative for fracture or focal lesion. Sinuses/Orbits: No acute finding. Other: None CT CERVICAL SPINE FINDINGS Alignment: Unchanged moderate kyphosis at C5. 3 mm anterolisthesis of C3 on C4, unchanged. Skull base and vertebrae: Negative for fracture Soft tissues and spinal canal: No prevertebral fluid or swelling. No visible canal hematoma. Atherosclerotic calcifications of the carotid arteries bilaterally. Disc levels: Multilevel disc and facet degeneration. Similar cervical spondylosis  and spinal stenosis at C4-C5, C5-C6 and C6-C7. Upper chest: Negative Other: None IMPRESSION: 1. No acute intracranial pathology. Similar atrophy and chronic microvascular ischemic changes noted. 2. No acute fracture or static subluxation of the cervical spine. Similar cervical spondylosis as above. Electronically Signed   By: Dahlia Bailiff MD   On: 11/21/2020 17:48   CT CHEST ABDOMEN PELVIS W CONTRAST  Result Date: 11/21/2020 CLINICAL DATA:  86 year old male with multiple falls and abdominal trauma. EXAM: CT CHEST, ABDOMEN, AND PELVIS WITH CONTRAST TECHNIQUE: Multidetector CT imaging of the chest, abdomen and pelvis was performed following the standard protocol during bolus administration of intravenous contrast. CONTRAST:  162mL OMNIPAQUE IOHEXOL 300 MG/ML  SOLN COMPARISON:  CT abdomen pelvis dated 10/05/2020. FINDINGS: CT CHEST FINDINGS Cardiovascular: Top-normal cardiac size. No pericardial effusion. Three-vessel coronary vascular calcification. Advanced atherosclerotic calcification of the thoracic aorta. No aneurysmal dilatation or dissection. The origins of the great vessels of the aortic arch appear patent. The central pulmonary arteries are grossly unremarkable. Mediastinum/Nodes: No hilar or mediastinal adenopathy. The esophagus and the thyroid gland are grossly unremarkable. No mediastinal fluid collection. Lungs/Pleura: Bilateral calcified pleural plaques, likely sequela of prior asbestos exposure. No focal consolidation, pleural effusion, or pneumothorax. The central  airways are patent. Musculoskeletal: Osteopenia with degenerative changes of the spine. Several nondisplaced left anterior rib fractures involving the third, fourth, fifth and, likely sixth ribs. CT ABDOMEN PELVIS FINDINGS No intra-abdominal free air or free fluid. Hepatobiliary: Subcentimeter left hepatic hypodense lesion is too small to characterize. The liver is otherwise unremarkable. No intrahepatic biliary ductal dilatation.  Cholecystectomy. Pancreas: Unremarkable. No pancreatic ductal dilatation or surrounding inflammatory changes. Spleen: Normal in size without focal abnormality. Adrenals/Urinary Tract: The adrenal glands unremarkable. There is no hydronephrosis on either side. There is symmetric enhancement and excretion of contrast by both kidneys. The visualized ureters and urinary bladder appear unremarkable. Stomach/Bowel: Small scattered sigmoid diverticula without active inflammatory changes. There is no bowel obstruction or active inflammation. The appendix is normal. Vascular/Lymphatic: Advanced aortoiliac atherosclerotic disease. The IVC is unremarkable. No portal venous gas. There is no adenopathy. Reproductive: The prostate and seminal vesicles are grossly unremarkable. No pelvic masses Other: Small fat containing bilateral inguinal hernias. No fluid collection. Musculoskeletal: Osteopenia with degenerative changes of the spine. No acute osseous pathology. IMPRESSION: 1. Several nondisplaced left anterior rib fractures. No pneumothorax. 2. No acute/traumatic intra-abdominal or pelvic pathology. 3. Aortic Atherosclerosis (ICD10-I70.0). Electronically Signed   By: Anner Crete M.D.   On: 11/21/2020 17:47   DG Chest Portable 1 View  Result Date: 11/21/2020 CLINICAL DATA:  Multiple falls. EXAM: PORTABLE CHEST 1 VIEW COMPARISON:  Chest x-ray 06/24/2020 FINDINGS: Mild cardiac enlargement. Moderate tortuosity and calcification of the thoracic aorta. 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 There is moderate eventration of the right hemidiaphragm. No infiltrates, edema or effusions. No pneumothorax. No acute bony findings.  No obvious acute rib fractures. IMPRESSION: 1. Mild cardiac enlargement. 2. 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 3. No acute cardiopulmonary findings. 4. Grossly intact bony thorax. Electronically  Signed   By: Marijo Sanes M.D.   On: 11/21/2020 13:38    My personal review of EKG: Rhythm Afib, Rate 75 /min, QTc 444 ,no Acute ST changes   Assessment & Plan:    Principal Problem:   Acute hyponatremia Active Problems:   Essential hypertension   New onset atrial fibrillation (HCC)   Falls   Rib fractures   1. Acute symptomatic Hyponatremia 1. With confusion, weakness, and falls 2. Near syncopal episode 3. Na 111, one week ago 131 4. 538ml bolus in ED 5. Spoke to Dr. Justin Mend (nephro) - Advised maintenance fluids to knock out endogenous ADH, and lasix to dilute the urine 6. Will trend Na Q4 hours 2. Falls with rib fractures 1. Falls 2/2 weakness and one fall that was near syncope 2. Consult PT 3. Last fall 5 days ago 4. Incentive spirometer for prevention of pneumonia in the setting of multiple rib fractures 5. Pain scale 3. New onset Afib - rate controlled 1. Likely 2/2 electrolyte abnormality 2. Update echo 3. TSH in the AM 4. CT chest did not show acute pulm disease - patient does have a history of chronic lung disease 5. Trop 5, 5 6. CHADS VASC 4, start heparin drip 7. Rate controlled 4. HTN 1. Cardiology consulted from ED, advises to hold cardizem, as patient did have some bradycardic episodes in the ED 2. Continue losartan and hydralazine   DVT Prophylaxis-   heparin - SCDs   AM Labs Ordered, also please review Full Orders  Family Communication: Admission, patients condition and plan of care including tests being ordered have been discussed with the patient and wife  who indicate understanding and agree with the plan and Code Status.  Code Status:  Full  Admission status:Inpatient :The appropriate admission status for this patient is INPATIENT. Inpatient status is judged to be reasonable and necessary in order to provide the required intensity of service to ensure the patient's safety. The patient's presenting symptoms, physical exam findings, and initial  radiographic and laboratory data in the context of their chronic comorbidities is felt to place them at high risk for further clinical deterioration. Furthermore, it is not anticipated that the patient will be medically stable for discharge from the hospital within 2 midnights of admission. The following factors support the admission status of inpatient.     The patient's presenting symptoms include weakness and falls The worrisome physical exam findings include Confusion The initial radiographic and laboratory data are worrisome because of Multiple rib fractures, Hyponatremia 111 The chronic co-morbidities include HTN, BPH, HLD, GERD,        * I certify that at the point of admission it is my clinical judgment that the patient will require inpatient hospital care spanning beyond 2 midnights from the point of admission due to high intensity of service, high risk for further deterioration and high frequency of surveillance required.*  Time spent in minutes : [place]

## 2020-11-21 NOTE — ED Notes (Signed)
CRITICAL VALUE ALERT  Critical Value:  Na + 111  Date & Time Notied:  11/21/2020 @ 1351  Provider Notified: Dr Alvino Chapel  Orders Received/Actions taken: see new orders.

## 2020-11-22 ENCOUNTER — Ambulatory Visit: Payer: Medicare Other | Admitting: Neurology

## 2020-11-22 ENCOUNTER — Inpatient Hospital Stay (HOSPITAL_COMMUNITY): Payer: Medicare Other

## 2020-11-22 DIAGNOSIS — S2242XA Multiple fractures of ribs, left side, initial encounter for closed fracture: Secondary | ICD-10-CM

## 2020-11-22 DIAGNOSIS — I4891 Unspecified atrial fibrillation: Secondary | ICD-10-CM | POA: Diagnosis not present

## 2020-11-22 DIAGNOSIS — E871 Hypo-osmolality and hyponatremia: Secondary | ICD-10-CM | POA: Diagnosis not present

## 2020-11-22 DIAGNOSIS — I10 Essential (primary) hypertension: Secondary | ICD-10-CM | POA: Diagnosis not present

## 2020-11-22 DIAGNOSIS — I350 Nonrheumatic aortic (valve) stenosis: Secondary | ICD-10-CM

## 2020-11-22 LAB — COMPREHENSIVE METABOLIC PANEL
ALT: 76 U/L — ABNORMAL HIGH (ref 0–44)
AST: 37 U/L (ref 15–41)
Albumin: 2.6 g/dL — ABNORMAL LOW (ref 3.5–5.0)
Alkaline Phosphatase: 54 U/L (ref 38–126)
Anion gap: 8 (ref 5–15)
BUN: 14 mg/dL (ref 8–23)
CO2: 23 mmol/L (ref 22–32)
Calcium: 8 mg/dL — ABNORMAL LOW (ref 8.9–10.3)
Chloride: 85 mmol/L — ABNORMAL LOW (ref 98–111)
Creatinine, Ser: 0.82 mg/dL (ref 0.61–1.24)
GFR, Estimated: >60 mL/min (ref >60)
Glucose, Bld: 99 mg/dL (ref 70–99)
Potassium: 3.6 mmol/L (ref 3.5–5.1)
Sodium: 116 mmol/L — CL (ref 135–145)
Total Bilirubin: 0.6 mg/dL (ref 0.3–1.2)
Total Protein: 5.5 g/dL — ABNORMAL LOW (ref 6.5–8.1)

## 2020-11-22 LAB — ECHOCARDIOGRAM COMPLETE
AR max vel: 1.66 cm2
AV Area VTI: 1.78 cm2
AV Area mean vel: 1.83 cm2
AV Mean grad: 10.3 mmHg
AV Peak grad: 18.8 mmHg
Ao pk vel: 2.17 m/s
Area-P 1/2: 3.19 cm2
Height: 69 in
S' Lateral: 2.28 cm
Weight: 3880.1 oz

## 2020-11-22 LAB — CBC
HCT: 33.4 % — ABNORMAL LOW (ref 39.0–52.0)
Hemoglobin: 11.6 g/dL — ABNORMAL LOW (ref 13.0–17.0)
MCH: 30.5 pg (ref 26.0–34.0)
MCHC: 34.7 g/dL (ref 30.0–36.0)
MCV: 87.9 fL (ref 80.0–100.0)
Platelets: 434 10*3/uL — ABNORMAL HIGH (ref 150–400)
RBC: 3.8 MIL/uL — ABNORMAL LOW (ref 4.22–5.81)
RDW: 13.2 % (ref 11.5–15.5)
WBC: 6 10*3/uL (ref 4.0–10.5)
nRBC: 0 % (ref 0.0–0.2)

## 2020-11-22 LAB — URINE CULTURE: Culture: <10000 — AB

## 2020-11-22 LAB — TSH: TSH: 0.238 u[IU]/mL — ABNORMAL LOW (ref 0.350–4.500)

## 2020-11-22 LAB — OSMOLALITY: Osmolality: 246 mOsm/kg — CL (ref 275–295)

## 2020-11-22 LAB — SODIUM: Sodium: 116 mmol/L — CL (ref 135–145)

## 2020-11-22 LAB — HEPARIN LEVEL (UNFRACTIONATED)
Heparin Unfractionated: 0.62 IU/mL (ref 0.30–0.70)
Heparin Unfractionated: 0.58 IU/mL (ref 0.30–0.70)

## 2020-11-22 LAB — CORTISOL: Cortisol, Plasma: 14.8 ug/dL

## 2020-11-22 LAB — T4, FREE: Free T4: 1.51 ng/dL — ABNORMAL HIGH (ref 0.61–1.12)

## 2020-11-22 LAB — MRSA PCR SCREENING: MRSA by PCR: NEGATIVE

## 2020-11-22 MED ORDER — FUROSEMIDE 20 MG PO TABS
20.0000 mg | ORAL_TABLET | Freq: Every day | ORAL | Status: DC
  Administered 2020-11-23: 20 mg via ORAL
  Filled 2020-11-22: qty 1

## 2020-11-22 MED ORDER — GUAIFENESIN-DM 100-10 MG/5ML PO SYRP
5.0000 mL | ORAL_SOLUTION | ORAL | Status: DC | PRN
  Administered 2020-11-22: 5 mL via ORAL
  Filled 2020-11-22: qty 5

## 2020-11-22 MED ORDER — CHLORHEXIDINE GLUCONATE CLOTH 2 % EX PADS
6.0000 | MEDICATED_PAD | Freq: Every day | CUTANEOUS | Status: DC
  Administered 2020-11-22 – 2020-11-27 (×6): 6 via TOPICAL

## 2020-11-22 MED ORDER — POTASSIUM CHLORIDE CRYS ER 20 MEQ PO TBCR
40.0000 meq | EXTENDED_RELEASE_TABLET | Freq: Once | ORAL | Status: AC
  Administered 2020-11-22: 40 meq via ORAL
  Filled 2020-11-22: qty 2

## 2020-11-22 NOTE — Consult Note (Signed)
Reason for Consult: Hyponatremia Referring Physician: Reinhart Saulters is an 86 y.o. male with past medical history significant for asbestosis and chronic lung disease, OSA, PSVT, HTN and BPH, inner ear issues.  He also appears to have some chronic hyponatremia with sodiums in the high 120's to low 130's since 2013 , last noted 131 in 2020. He was brought to the ER yesterday by his wife after a fall and c/o generalized weakness.  Apparently had UTI as OP associated with BPH-  Got started on bactrim.  Sodium 7 days ago was reportedly 121-122 per the wife.  Since then had nausea and vomiting- syncope and falls also with confusion.  In the ER BP was 153/68, in A fib-  Initial sodium was 111.  He was on low dose lasix 20 mg daily as OP.  No other agents notorious for causing hyponatremia.  He was started on NS at 75 ml/hour overnight-  Sodium 116 this AM.  Renal function is normal.  Urine is bland, urine sodium was 38 and urine osm 242.  TSH if anything is low, cortisol is OK.  He feels better this AM-  Very inquisitive regarding his sodium.  Tells me that he has been taking the furosemide 20 daily but has polyuria, nocturia and urgency - thinks it is from that    Trend in Creatinine: Creat  Date/Time Value Ref Range Status  09/22/2020 10:06 AM 0.78 0.70 - 1.11 mg/dL Final    Comment:    For patients >16 years of age, the reference limit for Creatinine is approximately 13% higher for people identified as African-American. Marland Kitchen   12/18/2017 10:44 AM 1.00 0.70 - 1.11 mg/dL Final    Comment:    For patients >15 years of age, the reference limit for Creatinine is approximately 13% higher for people identified as African-American. .    Creatinine, Ser  Date/Time Value Ref Range Status  11/22/2020 05:46 AM 0.82 0.61 - 1.24 mg/dL Final  11/21/2020 12:47 PM 1.01 0.61 - 1.24 mg/dL Final  12/31/2018 12:40 AM 0.69 0.61 - 1.24 mg/dL Final  11/02/2018 07:46 AM 0.80 0.61 - 1.24 mg/dL Final  11/02/2018  07:35 AM 0.84 0.61 - 1.24 mg/dL Final  10/30/2017 05:25 AM 0.82 0.61 - 1.24 mg/dL Final  10/28/2017 04:53 AM 0.76 0.61 - 1.24 mg/dL Final  10/26/2017 06:23 AM 0.73 0.61 - 1.24 mg/dL Final  10/25/2017 05:38 AM 0.68 0.61 - 1.24 mg/dL Final  10/24/2017 05:38 AM 0.75 0.61 - 1.24 mg/dL Final  10/22/2017 05:45 AM 0.85 0.61 - 1.24 mg/dL Final  10/21/2017 01:14 PM 0.99 0.61 - 1.24 mg/dL Final  10/19/2017 02:53 PM 0.92 0.61 - 1.24 mg/dL Final  06/29/2017 03:52 PM 0.81 0.61 - 1.24 mg/dL Final  07/01/2014 10:45 AM 0.90 0.50 - 1.35 mg/dL Final  06/10/2014 02:47 PM 0.90 0.50 - 1.35 mg/dL Final  01/20/2014 03:55 AM 0.67 0.50 - 1.35 mg/dL Final  01/19/2014 03:55 AM 0.74 0.50 - 1.35 mg/dL Final  01/11/2014 02:40 PM 0.79 0.50 - 1.35 mg/dL Final  07/12/2013 12:00 PM 0.72 0.50 - 1.35 mg/dL Final  07/08/2013 04:10 AM 0.50 0.50 - 1.35 mg/dL Final  07/07/2013 04:20 AM 0.54 0.50 - 1.35 mg/dL Final  07/06/2013 05:45 AM 0.65 0.50 - 1.35 mg/dL Final  06/30/2013 03:15 PM 0.69 0.50 - 1.35 mg/dL Final  04/03/2012 02:55 PM 0.82 0.50 - 1.35 mg/dL Final  12/26/2011 07:38 AM 0.76 0.50 - 1.35 mg/dL Final  12/17/2011 06:54 AM 0.74 0.50 - 1.35 mg/dL Final  04/26/2009 08:26 PM 0.98 0.40 - 1.50 mg/dL Final  01/06/2009 10:52 PM 1.01 0.40 - 1.50 mg/dL Final  12/16/2007 10:38 PM 1.06 0.40 - 1.50 mg/dL Final  08/04/2007 10:18 PM 0.77 0.40 - 1.50 mg/dL Final  05/05/2007 10:17 AM 0.8 0.4 - 1.5 mg/dL Final   Sodium  Date/Time Value Ref Range Status  11/22/2020 05:46 AM 116 (LL) 135 - 145 mmol/L Final    Comment:    CRITICAL RESULT CALLED TO, READ BACK BY AND VERIFIED WITH: WAGONER,R AT 6:55AM ON 11/22/20 BY [facility]   11/21/2020 09:10 PM 115 (LL) 135 - 145 mmol/L Final    Comment:    CRITICAL RESULT CALLED TO, READ BACK BY AND VERIFIED WITH: TETREAULT,RN AT 2220 ON 2.7.22 BY ISLEY,B Performed at [facility], [address] [place], [place] [address]   11/21/2020 12:47 PM 111 (LL) 135 - 145 mmol/L Final    Comment:     CRITICAL RESULT CALLED TO, READ BACK BY AND VERIFIED WITH: MARTIN,D. RN @1350  11/21/20 BILLINGSLEY,L   12/31/2018 12:40 AM 131 (L) 135 - 145 mmol/L Final  11/02/2018 07:46 AM 129 (L) 135 - 145 mmol/L Final  11/02/2018 07:35 AM 128 (L) 135 - 145 mmol/L Final  12/18/2017 10:44 AM 129 (L) 135 - 146 mmol/L Final  10/30/2017 05:25 AM 127 (L) 135 - 145 mmol/L Final  10/28/2017 04:53 AM 129 (L) 135 - 145 mmol/L Final  10/26/2017 06:23 AM 129 (L) 135 - 145 mmol/L Final  10/25/2017 05:38 AM 128 (L) 135 - 145 mmol/L Final  10/24/2017 05:38 AM 128 (L) 135 - 145 mmol/L Final  10/22/2017 05:45 AM 127 (L) 135 - 145 mmol/L Final  10/21/2017 01:14 PM 125 (L) 135 - 145 mmol/L Final  10/19/2017 02:53 PM 132 (L) 135 - 145 mmol/L Final  06/29/2017 03:52 PM 129 (L) 135 - 145 mmol/L Final  07/01/2014 10:45 AM 137 137 - 147 mEq/L Final  01/20/2014 03:55 AM 134 (L) 137 - 147 mEq/L Final  01/19/2014 03:55 AM 131 (L) 137 - 147 mEq/L Final  01/11/2014 02:40 PM 134 (L) 137 - 147 mEq/L Final  07/12/2013 12:00 PM 125 (L) 135 - 145 mEq/L Final  07/08/2013 04:10 AM 124 (L) 135 - 145 mEq/L Final  07/07/2013 04:20 AM 122 (L) 135 - 145 mEq/L Final  07/06/2013 05:45 AM 127 (L) 135 - 145 mEq/L Final  06/30/2013 03:15 PM 130 (L) 135 - 145 mEq/L Final  04/03/2012 02:55 PM 132 (L) 135 - 145 mEq/L Final  12/26/2011 07:38 AM 132 (L) 135 - 145 mEq/L Final  12/17/2011 06:54 AM 129 (L) 135 - 145 mEq/L Final  04/26/2009 08:26 PM 135 135 - 145 meq/L Final  01/06/2009 10:52 PM 139 135 - 145 meq/L Final  12/16/2007 10:38 PM 139 135 - 145 meq/L Final  08/04/2007 10:18 PM 135 135 - 145 meq/L Final  05/05/2007 10:17 AM 137 135 - 145 meq/L Final   PMH:   Past Medical History:  Diagnosis Date  . Allergic rhinitis   . Aortic stenosis   . Arthritis   . BPH (benign prostatic hyperplasia)   . Coronary artery calcification seen on CT scan   . Easy bruisability   . ED (erectile dysfunction)   . Essential hypertension   . GERD  (gastroesophageal reflux disease)   . H/O hiatal hernia   . Hearing loss in left ear   . History of asbestosis   . History of shingles   . Hyperlipidemia   . Overactive bladder   .  PSVT (paroxysmal supraventricular tachycardia) ([place])   . Sleep apnea    No longer on CPAP following weight loss  . Tinnitus     PSH:   Past Surgical History:  Procedure Laterality Date  . BACK SURGERY  2014   herniated L1, L2   Dr Carloyn Manner  . BRAIN SURGERY    . CEREBRAL EMBOLIZATION  12/2011   "radiation therapy-did not work"  . CHOLECYSTECTOMY  6/98  . COLONOSCOPY  09/2009   Dr. Lindalou Hose: normal, internal hemorrhoids   . COLONOSCOPY N/A 02/28/2017   Dr. Gala Romney: Hemorrhoids, grade 3, mild diverticulosis.  Marland Kitchen CRANIECTOMY FOR EXCISION OF ACOUSTIC NEUROMA  3/95  . MINOR AMPUTATION OF DIGIT Left 12/31/2018   Procedure: REVISION AMPUTATION OF LEFT INDEX FINGER, IRRIGATION AND DEBRIDEMENT LEFT INDEX FINGER;  Surgeon: Verner Mould, MD;  Location: [place];  Service: Orthopedics;  Laterality: Left;  . RADIOLOGY WITH ANESTHESIA N/A 07/07/2014   Procedure: EMBOLIZATION;  Surgeon: Rob Hickman, MD;  Location: [place];  Service: Radiology;  Laterality: N/A;  . TOTAL KNEE ARTHROPLASTY Left 07/06/2013   Procedure: LEFT TOTAL KNEE ARTHROPLASTY;  Surgeon: Gearlean Alf, MD;  Location: [facility];  Service: Orthopedics;  Laterality: Left;  . TOTAL KNEE ARTHROPLASTY Right 01/18/2014   Procedure: RIGHT TOTAL KNEE ARTHROPLASTY;  Surgeon: Gearlean Alf, MD;  Location: [facility];  Service: Orthopedics;  Laterality: Right;  . TRIGGER FINGER RELEASE  2003   (thumb) middle finger (2006)    Allergies:  Allergies  Allergen Reactions  . Prednisone     Pt is high functioning and out of sorts.  . Codeine Other (See Comments)    REACTION: groggy  . Tape Other (See Comments)    Skin tears, use paper tape    Medications:   Prior to Admission medications   Medication Sig Start Date End Date Taking? Authorizing Provider   acetaminophen (TYLENOL) 325 MG tablet Take 325 mg by mouth as needed.   Yes [provider]  alfuzosin (UROXATRAL) 10 MG 24 hr tablet Take 1 tablet (10 mg total) by mouth at bedtime. 09/05/20  Yes McKenzie, Candee Furbish, MD  azelastine (ASTELIN) 0.1 % nasal spray 1-2 puffs each nostril every 8 hours if needed for drainage Patient taking differently: as needed. 1-2 puffs each nostril every 8 hours if needed for drainage 12/17/17  Yes Young, Tarri Fuller D, MD  budesonide-formoterol [facility]) 160-4.5 MCG/ACT inhaler Inhale 2 puffs then rinse mouth twice daily 12/23/19  Yes Young, Clinton D, MD  Calcium Carb-Vit D-C-E-Mineral (OS-CAL ULTRA) 600 MG TABS Take 1 tablet by mouth daily.    Yes [provider]  diclofenac Sodium (VOLTAREN) 1 % GEL Apply 2 g topically daily as needed (For pain). 03/08/20  Yes [provider]  diltiazem (CARDIZEM CD) 120 MG 24 hr capsule Take 1 capsule by mouth twice daily 08/24/20  Yes Satira Sark, MD  esomeprazole (NEXIUM) 40 MG capsule Take 80 mg by mouth 2 (two) times daily before a meal.   Yes [provider]  fexofenadine (ALLEGRA) 180 MG tablet Take 180 mg by mouth daily as needed for allergies.   Yes [provider]  fluticasone (FLONASE) 50 MCG/ACT nasal spray Place 2 sprays into both nostrils daily. Patient taking differently: Place 2 sprays into both nostrils daily as needed for allergies. 05/03/16  Yes Young, Tarri Fuller D, MD  furosemide (LASIX) 20 MG tablet Take 1 tablet (20 mg total) by mouth as needed. Patient taking differently: Take 20 mg by  mouth daily as needed for fluid. 12/27/17 08/08/20 Yes Herminio Commons, MD  hydrALAZINE (APRESOLINE) 50 MG tablet Take 1.5 tablets (75 mg total) by mouth 3 (three) times daily. 01/01/20  Yes Strader, [place] M, PA-C  latanoprost (XALATAN) 0.005 % ophthalmic solution Place 1 drop into both eyes at bedtime.  09/01/12  Yes [provider]  LORazepam (ATIVAN) 1 MG tablet Take  1 tablet by mouth three times daily as needed Patient taking differently: Take 1 mg by mouth 3 (three) times daily as needed for anxiety. 07/14/20  Yes Young, Tarri Fuller D, MD  losartan (COZAAR) 100 MG tablet Take 100 mg by mouth every morning.    Yes [provider]  Multiple Vitamins-Minerals (CENTRUM SILVER PO) Take 1 tablet by mouth daily.    Yes [provider]  Polyethyl Glycol-Propyl Glycol (SYSTANE OP) Place 1 drop into both eyes daily.   Yes [provider]  polyethylene glycol (MIRALAX / GLYCOLAX) packet Take 17 g by mouth at bedtime.   Yes [provider]  potassium chloride SA (KLOR-CON) 20 MEQ tablet Take 1 tablet (20 mEq total) by mouth 2 (two) times daily. 09/01/20  Yes Verta Ellen., NP  pravastatin (PRAVACHOL) 40 MG tablet Take 1 tablet by mouth at bedtime.  10/05/14  Yes [provider]  PROAIR HFA 108 (90 Base) MCG/ACT inhaler INHALE 2 PUFFS BY MOUTH EVERY 6 HOURS AS NEEDED FOR WHEEZING FOR SHORTNESS OF BREATH Patient taking differently: Inhale 1 puff into the lungs every 6 (six) hours as needed. 03/18/20  Yes Young, Tarri Fuller D, MD  sulfamethoxazole-trimethoprim (BACTRIM DS) 800-160 MG tablet Take 1 tablet by mouth 2 (two) times daily. 11/14/20  Yes [provider]  Wheat Dextrin (BENEFIBER) POWD Take 1 packet by mouth 2 (two) times daily.   Yes [provider]  Zinc 25 MG TABS Take 1 tablet by mouth daily.   Yes [provider]  zolpidem (AMBIEN) 10 MG tablet Take 1 tablet by mouth at bedtime.  01/24/16  Yes [provider]    Discontinued Meds:   Medications Discontinued During This Encounter  Medication Reason  . sodium chloride (hypertonic) 3 % solution   . mirabegron ER (MYRBETRIQ) 25 MG TB24 tablet Patient Preference  . sildenafil (REVATIO) 20 MG tablet Patient Preference  . sildenafil (VIAGRA) 100 MG tablet Patient Preference  . tadalafil (CIALIS) 20 MG tablet Patient Preference  .  tamsulosin (FLOMAX) 0.4 MG CAPS capsule Patient Preference  . furosemide (LASIX) injection 20 mg     Social History:  reports that he quit smoking about 34 years ago. His smoking use included cigarettes. He started smoking about 63 years ago. He has a 45.00 pack-year smoking history. He has never used smokeless tobacco. He reports that he does not drink alcohol and does not use drugs.  Family History:   Family History  Problem Relation Age of Onset  . Cancer Father        oral cancer  . Breast cancer Mother   . Heart attack Mother   . Hypertension Sister        Bypass x4  . Alcohol abuse Sister   . Alzheimer's disease Sister   . Kidney disease Sister   . Colon cancer Neg Hx     A comprehensive review of systems was negative except for: Ears, nose, mouth, throat, and face: positive for hearing loss Genitourinary: positive for urgency, decreased stream and nocturia  Blood pressure (!) 100/55, pulse (!) 44, temperature  97.8 F (36.6 C), temperature source Oral, resp. rate (!) 23, height 5\' 9"  (1.753 m), weight 110 kg, SpO2 98 %. General appearance: alert, distracted and mild distress Resp: clear to auscultation bilaterally Cardio: irregularly irregular rhythm GI: soft, non-tender; bowel sounds normal; no masses,  no organomegaly Extremities: extremities normal, atraumatic, no cyanosis or edema  Labs: Basic Metabolic Panel: Recent Labs  Lab 11/21/20 1247 11/21/20 2110 11/22/20 0546  NA 111* 115* 116*  K 4.0  --  3.6  CL 79*  --  85*  CO2 22  --  23  GLUCOSE 100*  --  99  BUN 17  --  14  CREATININE 1.01  --  0.82  ALBUMIN 3.0*  --  2.6*  CALCIUM 8.3*  --  8.0*   Liver Function Tests: Recent Labs  Lab 11/21/20 1247 11/22/20 0546  AST 33 37  ALT 69* 76*  ALKPHOS 62 54  BILITOT 0.7 0.6  PROT 6.5 5.5*  ALBUMIN 3.0* 2.6*   Recent Labs  Lab 11/21/20 1247  LIPASE 50   No results for input(s): AMMONIA in the last 168 hours. CBC: Recent Labs  Lab 11/21/20 1247  11/22/20 0546  WBC 6.4 6.0  NEUTROABS 4.5  --   HGB 12.6* 11.6*  HCT 35.7* 33.4*  MCV 86.9 87.9  PLT 426* 434*   PT/INR: @labrcntip (inr:5) Cardiac Enzymes: No results for input(s): CKTOTAL, CKMB, CKMBINDEX, TROPONINI in the last 168 hours. CBG: No results for input(s): GLUCAP in the last 168 hours.  Iron Studies: No results for input(s): IRON, TIBC, TRANSFERRIN, FERRITIN in the last 168 hours.  Xrays/Other Studies: DG Pelvis 1-2 Views  Result Date: 11/21/2020 CLINICAL DATA:  Pelvic tenderness EXAM: PELVIS - 1-2 VIEW COMPARISON:  None. FINDINGS: There is no evidence of pelvic fracture or diastasis. No pelvic bone lesions are seen. Vascular calcifications noted IMPRESSION: No acute osseous abnormality. Electronically Signed   By: Suzy Bouchard M.D.   On: 11/21/2020 13:43   CT Head Wo Contrast  Result Date: 11/21/2020 CLINICAL DATA:  Being treated for UTI, increased weakness and falling. EXAM: CT HEAD WITHOUT CONTRAST CT CERVICAL SPINE WITHOUT CONTRAST TECHNIQUE: Multidetector CT imaging of the head and cervical spine was performed following the standard protocol without intravenous contrast. Multiplanar CT image reconstructions of the cervical spine were also generated. COMPARISON:  CT head and cervical spine October 23, 2018. FINDINGS: CT HEAD FINDINGS Brain: No evidence of acute infarction, hemorrhage, hydrocephalus, extra-axial collection or mass lesion/mass effect. Similar global parenchymal volume loss with ex vacuo dilatation of ventricular system. Mild burden of chronic small vessel white matter disease. Vascular: No hyperdense vessel. Vascular calcifications. 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 Skull: Normal. Negative for fracture or focal lesion. Sinuses/Orbits: No acute finding. Other: None CT CERVICAL SPINE FINDINGS Alignment: Unchanged moderate kyphosis at C5. 3 mm anterolisthesis of C3 on C4, unchanged. Skull base and vertebrae: Negative for fracture Soft  tissues and spinal canal: No prevertebral fluid or swelling. No visible canal hematoma. Atherosclerotic calcifications of the carotid arteries bilaterally. Disc levels: Multilevel disc and facet degeneration. Similar cervical spondylosis and spinal stenosis at C4-C5, C5-C6 and C6-C7. Upper chest: Negative Other: None IMPRESSION: 1. No acute intracranial pathology. Similar atrophy and chronic microvascular ischemic changes noted. 2. No acute fracture or static subluxation of the cervical spine. Similar cervical spondylosis as above. Electronically Signed   By: Dahlia Bailiff MD   On: 11/21/2020 17:48   CT Cervical Spine Wo Contrast  Result Date: 11/21/2020  CLINICAL DATA:  Being treated for UTI, increased weakness and falling. EXAM: CT HEAD WITHOUT CONTRAST CT CERVICAL SPINE WITHOUT CONTRAST TECHNIQUE: Multidetector CT imaging of the head and cervical spine was performed following the standard protocol without intravenous contrast. Multiplanar CT image reconstructions of the cervical spine were also generated. COMPARISON:  CT head and cervical spine October 23, 2018. FINDINGS: CT HEAD FINDINGS Brain: No evidence of acute infarction, hemorrhage, hydrocephalus, extra-axial collection or mass lesion/mass effect. Similar global parenchymal volume loss with ex vacuo dilatation of ventricular system. Mild burden of chronic small vessel white matter disease. Vascular: No hyperdense vessel. Vascular calcifications. 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 Skull: Normal. Negative for fracture or focal lesion. Sinuses/Orbits: No acute finding. Other: None CT CERVICAL SPINE FINDINGS Alignment: Unchanged moderate kyphosis at C5. 3 mm anterolisthesis of C3 on C4, unchanged. Skull base and vertebrae: Negative for fracture Soft tissues and spinal canal: No prevertebral fluid or swelling. No visible canal hematoma. Atherosclerotic calcifications of the carotid arteries bilaterally. Disc levels: Multilevel disc  and facet degeneration. Similar cervical spondylosis and spinal stenosis at C4-C5, C5-C6 and C6-C7. Upper chest: Negative Other: None IMPRESSION: 1. No acute intracranial pathology. Similar atrophy and chronic microvascular ischemic changes noted. 2. No acute fracture or static subluxation of the cervical spine. Similar cervical spondylosis as above. Electronically Signed   By: Dahlia Bailiff MD   On: 11/21/2020 17:48   CT CHEST ABDOMEN PELVIS W CONTRAST  Result Date: 11/21/2020 CLINICAL DATA:  86 year old male with multiple falls and abdominal trauma. EXAM: CT CHEST, ABDOMEN, AND PELVIS WITH CONTRAST TECHNIQUE: Multidetector CT imaging of the chest, abdomen and pelvis was performed following the standard protocol during bolus administration of intravenous contrast. CONTRAST:  164mL OMNIPAQUE IOHEXOL 300 MG/ML  SOLN COMPARISON:  CT abdomen pelvis dated 10/05/2020. FINDINGS: CT CHEST FINDINGS Cardiovascular: Top-normal cardiac size. No pericardial effusion. Three-vessel coronary vascular calcification. Advanced atherosclerotic calcification of the thoracic aorta. No aneurysmal dilatation or dissection. The origins of the great vessels of the aortic arch appear patent. The central pulmonary arteries are grossly unremarkable. Mediastinum/Nodes: No hilar or mediastinal adenopathy. The esophagus and the thyroid gland are grossly unremarkable. No mediastinal fluid collection. Lungs/Pleura: Bilateral calcified pleural plaques, likely sequela of prior asbestos exposure. No focal consolidation, pleural effusion, or pneumothorax. The central airways are patent. Musculoskeletal: Osteopenia with degenerative changes of the spine. Several nondisplaced left anterior rib fractures involving the third, fourth, fifth and, likely sixth ribs. CT ABDOMEN PELVIS FINDINGS No intra-abdominal free air or free fluid. Hepatobiliary: Subcentimeter left hepatic hypodense lesion is too small to characterize. The liver is otherwise  unremarkable. No intrahepatic biliary ductal dilatation. Cholecystectomy. Pancreas: Unremarkable. No pancreatic ductal dilatation or surrounding inflammatory changes. Spleen: Normal in size without focal abnormality. Adrenals/Urinary Tract: The adrenal glands unremarkable. There is no hydronephrosis on either side. There is symmetric enhancement and excretion of contrast by both kidneys. The visualized ureters and urinary bladder appear unremarkable. Stomach/Bowel: Small scattered sigmoid diverticula without active inflammatory changes. There is no bowel obstruction or active inflammation. The appendix is normal. Vascular/Lymphatic: Advanced aortoiliac atherosclerotic disease. The IVC is unremarkable. No portal venous gas. There is no adenopathy. Reproductive: The prostate and seminal vesicles are grossly unremarkable. No pelvic masses Other: Small fat containing bilateral inguinal hernias. No fluid collection. Musculoskeletal: Osteopenia with degenerative changes of the spine. No acute osseous pathology. IMPRESSION: 1. Several nondisplaced left anterior rib fractures. No pneumothorax. 2. No acute/traumatic intra-abdominal or pelvic pathology. 3. Aortic Atherosclerosis (ICD10-I70.0). Electronically Signed  By: Anner Crete M.D.   On: 11/21/2020 17:47   DG Chest Portable 1 View  Result Date: 11/21/2020 CLINICAL DATA:  Multiple falls. EXAM: PORTABLE CHEST 1 VIEW COMPARISON:  Chest x-ray 06/24/2020 FINDINGS: Mild cardiac enlargement. Moderate tortuosity and calcification of the thoracic aorta. 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 There is moderate eventration of the right hemidiaphragm. No infiltrates, edema or effusions. No pneumothorax. No acute bony findings.  No obvious acute rib fractures. IMPRESSION: 1. Mild cardiac enlargement. 2. 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 3. No acute cardiopulmonary  findings. 4. Grossly intact bony thorax. Electronically Signed   By: Marijo Sanes M.D.   On: 11/21/2020 13:38     Assessment/Plan: 86 year old WM with chronic lung disease and Afib- baseline hyponatremia (128-132) who presents with extreme hyponatremia with associated sxms 1.  Hyponatremia-  Volume status is dry to euvolemic. Urine sodium is normal and urine osm is appropriately low with loop diuretic on board.  TSH and cortisol WNL.  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  Yesterday with sodium of 111 and sxms I may have been tempted to do 3% saline-  But now sodium and MS are better.  I would continue with NS which is hypertonic to him and continue to follow sodium closely. Would aim for slow correction as is happening with aim of delta 12 over first 24 hours-  Goal of 123 at most at mid day today.   I will resume lasix tomorrow 2.  HTN/vol-  As an aside would watch BP-  Is coming down some to 90's with cozaar and hydralazine  3. Afib-  Cards on board-  Anticoagulation -  Rate controlled, even a little blocked 4. GU-  This seems to be a major issue for him.  I urged him to discuss with his urologist-  Would he be a candidate for some type of procedure ??  5. Hypokalemia-  Will replete with 40 PO today    Gwendlyon Zumbro A Tavaughn Silguero 11/22/2020, 8:46 AM

## 2020-11-22 NOTE — Plan of Care (Signed)
  Problem: Acute Rehab PT Goals(only PT should resolve) Goal: Pt Will Go Supine/Side To Sit Outcome: Progressing Flowsheets (Taken 11/22/2020 1501) Pt will go Supine/Side to Sit:  with supervision  with min guard assist Goal: Patient Will Transfer Sit To/From Stand Outcome: Progressing Flowsheets (Taken 11/22/2020 1501) Patient will transfer sit to/from stand:  with min guard assist  with supervision Goal: Pt Will Transfer Bed To Chair/Chair To Bed Outcome: Progressing Flowsheets (Taken 11/22/2020 1501) Pt will Transfer Bed to Chair/Chair to Bed:  with supervision  min guard assist Goal: Pt Will Ambulate Outcome: Progressing Flowsheets (Taken 11/22/2020 1501) Pt will Ambulate:  75 feet  with min guard assist  with rolling walker   3:02 PM, 11/22/20 Lonell Grandchild, MPT Physical Therapist with [facility] [phone] office [phone] mobile phone

## 2020-11-22 NOTE — Progress Notes (Signed)
ANTICOAGULATION CONSULT NOTE -   Pharmacy Consult for heparin gtt  Indication: atrial fibrillation  Allergies  Allergen Reactions  . Prednisone     Pt is high functioning and out of sorts.  . Codeine Other (See Comments)    REACTION: groggy  . Tape Other (See Comments)    Skin tears, use paper tape    Patient Measurements: Height: 5\' 9"  (175.3 cm) Weight: 110 kg (242 lb 8.1 oz) IBW/kg (Calculated) : 70.7 HEPARIN DW (KG): 95.5   Vital Signs: Temp: 97.8 F (36.6 C) (02/08 0500) Temp Source: Oral (02/08 0500) BP: 100/55 (02/08 0600) Pulse Rate: 44 (02/08 0600)  Labs: Recent Labs    11/21/20 1247 11/22/20 0546  HGB 12.6* 11.6*  HCT 35.7* 33.4*  PLT 426* 434*  HEPARINUNFRC  --  0.62  CREATININE 1.01 0.82    Estimated Creatinine Clearance: 83.4 mL/min (by C-G formula based on SCr of 0.82 mg/dL).   Medical History: Past Medical History:  Diagnosis Date  . Allergic rhinitis   . Aortic stenosis   . Arthritis   . BPH (benign prostatic hyperplasia)   . Coronary artery calcification seen on CT scan   . Easy bruisability   . ED (erectile dysfunction)   . Essential hypertension   . GERD (gastroesophageal reflux disease)   . H/O hiatal hernia   . Hearing loss in left ear   . History of asbestosis   . History of shingles   . Hyperlipidemia   . Overactive bladder   . PSVT (paroxysmal supraventricular tachycardia) ([place])   . Sleep apnea    No longer on CPAP following weight loss  . Tinnitus     Medications:  Medications Prior to Admission  Medication Sig Dispense Refill Last Dose  . acetaminophen (TYLENOL) 325 MG tablet Take 325 mg by mouth as needed.   Past Week at Unknown time  . alfuzosin (UROXATRAL) 10 MG 24 hr tablet Take 1 tablet (10 mg total) by mouth at bedtime. 30 tablet 11 Past Month at Unknown time  . azelastine (ASTELIN) 0.1 % nasal spray 1-2 puffs each nostril every 8 hours if needed for drainage (Patient taking differently: as needed. 1-2 puffs each  nostril every 8 hours if needed for drainage) 30 mL 12 Past Month at Unknown time  . budesonide-formoterol (SYMBICORT) 160-4.5 MCG/ACT inhaler Inhale 2 puffs then rinse mouth twice daily 1 Inhaler 12 11/20/2020 at Unknown time  . Calcium Carb-Vit D-C-E-Mineral (OS-CAL ULTRA) 600 MG TABS Take 1 tablet by mouth daily.    11/20/2020 at Unknown time  . diclofenac Sodium (VOLTAREN) 1 % GEL Apply 2 g topically daily as needed (For pain).   11/20/2020 at Unknown time  . diltiazem (CARDIZEM CD) 120 MG 24 hr capsule Take 1 capsule by mouth twice daily 180 capsule 3 11/20/2020 at Unknown time  . esomeprazole (NEXIUM) 40 MG capsule Take 80 mg by mouth 2 (two) times daily before a meal.   11/20/2020 at Unknown time  . fexofenadine (ALLEGRA) 180 MG tablet Take 180 mg by mouth daily as needed for allergies.   Past Week at Unknown time  . fluticasone (FLONASE) 50 MCG/ACT nasal spray Place 2 sprays into both nostrils daily. (Patient taking differently: Place 2 sprays into both nostrils daily as needed for allergies.) 18.2 g 12 Past Week at Unknown time  . furosemide (LASIX) 20 MG tablet Take 1 tablet (20 mg total) by mouth as needed. (Patient taking differently: Take 20 mg by mouth daily as needed for  fluid.)   Past Week at Unknown time  . hydrALAZINE (APRESOLINE) 50 MG tablet Take 1.5 tablets (75 mg total) by mouth 3 (three) times daily. 410 tablet 3 11/20/2020 at Unknown time  . latanoprost (XALATAN) 0.005 % ophthalmic solution Place 1 drop into both eyes at bedtime.    11/20/2020 at Unknown time  . LORazepam (ATIVAN) 1 MG tablet Take 1 tablet by mouth three times daily as needed (Patient taking differently: Take 1 mg by mouth 3 (three) times daily as needed for anxiety.) 180 tablet 1 11/20/2020 at Unknown time  . losartan (COZAAR) 100 MG tablet Take 100 mg by mouth every morning.    11/20/2020 at Unknown time  . Multiple Vitamins-Minerals (CENTRUM SILVER PO) Take 1 tablet by mouth daily.    11/20/2020 at Unknown time  . Polyethyl  Glycol-Propyl Glycol (SYSTANE OP) Place 1 drop into both eyes daily.   11/20/2020 at Unknown time  . polyethylene glycol (MIRALAX / GLYCOLAX) packet Take 17 g by mouth at bedtime.   11/20/2020 at Unknown time  . potassium chloride SA (KLOR-CON) 20 MEQ tablet Take 1 tablet (20 mEq total) by mouth 2 (two) times daily. 180 tablet 1 11/20/2020 at Unknown time  . pravastatin (PRAVACHOL) 40 MG tablet Take 1 tablet by mouth at bedtime.    11/20/2020 at Unknown time  . PROAIR HFA 108 (90 Base) MCG/ACT inhaler INHALE 2 PUFFS BY MOUTH EVERY 6 HOURS AS NEEDED FOR WHEEZING FOR SHORTNESS OF BREATH (Patient taking differently: Inhale 1 puff into the lungs every 6 (six) hours as needed.) 18 g 12 11/21/2020 at Unknown time  . sulfamethoxazole-trimethoprim (BACTRIM DS) 800-160 MG tablet Take 1 tablet by mouth 2 (two) times daily.   Past Week at Unknown time  . Wheat Dextrin (BENEFIBER) POWD Take 1 packet by mouth 2 (two) times daily.   11/20/2020 at Unknown time  . Zinc 25 MG TABS Take 1 tablet by mouth daily.   11/20/2020 at Unknown time  . zolpidem (AMBIEN) 10 MG tablet Take 1 tablet by mouth at bedtime.    11/20/2020 at Unknown time   Scheduled:  . alfuzosin  10 mg Oral QHS  . Chlorhexidine Gluconate Cloth  6 each Topical Daily  . hydrALAZINE  75 mg Oral TID  . losartan  100 mg Oral q morning  . mirabegron ER  25 mg Oral Daily  . mometasone-formoterol  2 puff Inhalation BID  . pravastatin  40 mg Oral QHS  . tamsulosin  0.4 mg Oral Daily   Infusions:  . sodium chloride 75 mL/hr at 11/22/20 0651  . heparin 1,550 Units/hr (11/22/20 0651)   PRN: acetaminophen **OR** acetaminophen, albuterol, guaiFENesin-dextromethorphan, iohexol, LORazepam, ondansetron **OR** ondansetron (ZOFRAN) IV, oxyCODONE, zolpidem Anti-infectives (From admission, onward)   None      Assessment: Julian Fowler a 86 y.o. male requires anticoagulation with a heparin iv infusion for the indication of  atrial fibrillation. Heparin gtt will be started  following pharmacy protocol per pharmacy consult. Patient is not on previous oral anticoagulant that will require aPTT/HL correlation before transitioning to only HL monitoring.  HL 0.62, therapeutic  Goal of Therapy:  Heparin level 0.3-0.7 units/ml Monitor platelets by anticoagulation protocol: Yes   Plan:  Continue  heparin infusion at 1550 units/hr Check anti-Xa level in 8 hours and daily while on heparin Continue to monitor H&H and platelets  Isac Sarna, BS Vena [place], BCPS Clinical Pharmacist Pager [phone] 11/22/2020,7:39 AM

## 2020-11-22 NOTE — Progress Notes (Signed)
PROGRESS NOTE    Julian Fowler  OQH:[id number] DOB: [birth year] DOA: 11/21/2020 PCP: [name], MD    Brief Narrative:  86 year old male with a history of BPH, paroxysmal SVT, obstructive sleep apnea, admitted to the hospital with generalized weakness, falls.  He was found to be significantly hyponatremic.  Also found to have rib fractures related to fall.  Was also noted to have new onset atrial fibrillation.  Being seen by nephrology and cardiology.  Serum sodium level slowly improving.  A. fib is controlled.  Anticipate discharge in the next 1 to 2 days.   Assessment & Plan:   Principal Problem:   Acute hyponatremia Active Problems:   Essential hypertension   New onset atrial fibrillation (HCC)   Falls   Rib fractures   Acute hyponatremia -Admitted with weakness, confusion and falls -Sodium of 111 on admission -Started on normal saline loop diuretic -Overall serum sodium has been trending up -Urine osmolality is low -Appreciate nephrology input -Patient reports poor p.o. intake for the past week due to nausea from recent medications, at the most he is eating some crackers -Appears to have a component of "tea and toast" -May also have some component of SIADH  New onset atrial fibrillation -Currently rate controlled -Appreciate cardiology input -TSH unremarkable -CHA2DS2-VASc 4, but with recent falls, not felt to be an appropriate candidate for anticoagulation at this time.  Will need to be readdressed as an outpatient  Falls with rib fractures -Secondary to weakness -Seen by PT with recommendation for skilled nursing facility -Patient and family will likely elect to go home with home health  Hypertension -On losartan and hydralazine -Blood pressure stable  BPH -Continue alfuzosin   DVT prophylaxis: SCDs Start: 11/21/20 2057  Code Status: Full code Family Communication: Discussed with wife at the bedside Disposition Plan: Status is:  Inpatient  Remains inpatient appropriate because:Persistent severe electrolyte disturbances   Dispo: The patient is from: Home              Anticipated d/c is to: Home              Anticipated d/c date is: 2 days              Patient currently is not medically stable to d/c.   Difficult to place patient No         Consultants:   Nephrology  Cardiology  Procedures:     Antimicrobials:       Subjective: Patient is awake, alert speaking appropriately.  Denies any shortness of breath.  Reports that his p.o. intake has been poor over the past week due to nausea from recent medication for prostate that was started.  Objective: Vitals:   11/22/20 1800 11/22/20 1900 11/22/20 2017 11/22/20 2112  BP: (!) 109/54 116/65  (!) 158/58  Pulse: 77 75    Resp: 15 17    Temp:      TempSrc:      SpO2: 97% 98% 96%   Weight:      Height:        Intake/Output Summary (Last 24 hours) at 11/22/2020 2204 Last data filed at 11/22/2020 1826 Gross per 24 hour  Intake 2782.94 ml  Output 1400 ml  Net 1382.94 ml   Filed Weights   11/21/20 1104 11/21/20 2200  Weight: 112 kg 110 kg    Examination:  General exam: Appears calm and comfortable  Respiratory system: Clear to auscultation. Respiratory effort normal. Cardiovascular system: S1 & S2 heard,  RRR. No JVD, murmurs, rubs, gallops or clicks. No pedal edema. Gastrointestinal system: Abdomen is nondistended, soft and nontender. No organomegaly or masses felt. Normal bowel sounds heard. Central nervous system: Alert and oriented. No focal neurological deficits. Extremities: Symmetric 5 x 5 power. Skin: No rashes, lesions or ulcers Psychiatry: Judgement and insight appear normal. Mood & affect appropriate.     Data Reviewed: I have personally reviewed following labs and imaging studies  CBC: Recent Labs  Lab 11/21/20 1247 11/22/20 0546  WBC 6.4 6.0  NEUTROABS 4.5  --   HGB 12.6* 11.6*  HCT 35.7* 33.4*  MCV 86.9 87.9  PLT  426* 779*   Basic Metabolic Panel: Recent Labs  Lab 11/21/20 1247 11/21/20 2110 11/22/20 0546 11/22/20 1658  NA 111* 115* 116* 116*  K 4.0  --  3.6  --   CL 79*  --  85*  --   CO2 22  --  23  --   GLUCOSE 100*  --  99  --   BUN 17  --  14  --   CREATININE 1.01  --  0.82  --   CALCIUM 8.3*  --  8.0*  --   MG  --  1.7  --   --    GFR: Estimated Creatinine Clearance: 83.4 mL/min (by C-G formula based on SCr of 0.82 mg/dL). Liver Function Tests: Recent Labs  Lab 11/21/20 1247 11/22/20 0546  AST 33 37  ALT 69* 76*  ALKPHOS 62 54  BILITOT 0.7 0.6  PROT 6.5 5.5*  ALBUMIN 3.0* 2.6*   Recent Labs  Lab 11/21/20 1247  LIPASE 50   No results for input(s): AMMONIA in the last 168 hours. Coagulation Profile: No results for input(s): INR, PROTIME in the last 168 hours. Cardiac Enzymes: No results for input(s): CKTOTAL, CKMB, CKMBINDEX, TROPONINI in the last 168 hours. BNP (last 3 results) No results for input(s): PROBNP in the last 8760 hours. HbA1C: No results for input(s): HGBA1C in the last 72 hours. CBG: No results for input(s): GLUCAP in the last 168 hours. Lipid Profile: No results for input(s): CHOL, HDL, LDLCALC, TRIG, CHOLHDL, LDLDIRECT in the last 72 hours. Thyroid Function Tests: Recent Labs    11/22/20 0546 11/22/20 0916  TSH 0.238*  --   FREET4  --  1.51*   Anemia Panel: No results for input(s): VITAMINB12, FOLATE, FERRITIN, TIBC, IRON, RETICCTPCT in the last 72 hours. Sepsis Labs: Recent Labs  Lab 11/21/20 1248  LATICACIDVEN 0.6    Recent Results (from the past 240 hour(s))  Urine culture     Status: Abnormal   Collection Time: 11/21/20 12:30 PM   Specimen: Urine, Random  Result Value Ref Range Status   Specimen Description   Final    URINE, RANDOM Performed at [facility], [address] [place], [place] [address]    Special Requests   Final    NONE Performed at [facility] [address] [place], [place] [address]    Culture (A)   Final    <10,000 COLONIES/mL INSIGNIFICANT GROWTH Performed at [place] [address] [place], [place] [address]    Report Status 11/22/2020 FINAL  Final  Blood culture (routine x 2)     Status: None (Preliminary result)   Collection Time: 11/21/20 12:46 PM   Specimen: BLOOD  Result Value Ref Range Status   Specimen Description BLOOD RIGHT ANTECUBITAL  Final   Special Requests   Final    BOTTLES DRAWN AEROBIC  AND ANAEROBIC Blood Culture adequate volume   Culture   Final    NO GROWTH < 24 HOURS Performed at [facility], [address], [place], [place] [address]    Report Status PENDING  Incomplete  Blood culture (routine x 2)     Status: None (Preliminary result)   Collection Time: 11/21/20 12:54 PM   Specimen: BLOOD  Result Value Ref Range Status   Specimen Description BLOOD LEFT ANTECUBITAL  Final   Special Requests   Final    BOTTLES DRAWN AEROBIC AND ANAEROBIC Blood Culture adequate volume   Culture   Final    NO GROWTH < 24 HOURS Performed at [facility], [address] [place], [place] [address]    Report Status PENDING  Incomplete  SARS Coronavirus 2 by RT PCR (hospital order, performed in [place] hospital lab) Nasopharyngeal Nasopharyngeal Swab     Status: None   Collection Time: 11/21/20  1:54 PM   Specimen: Nasopharyngeal Swab  Result Value Ref Range Status   SARS Coronavirus 2 NEGATIVE NEGATIVE Final    Comment: (NOTE) SARS-CoV-2 target nucleic acids are NOT DETECTED.  The SARS-CoV-2 RNA is generally detectable in upper and lower respiratory specimens during the acute phase of infection. The lowest concentration of SARS-CoV-2 viral copies this assay can detect is 250 copies / mL. A negative result does not preclude SARS-CoV-2 infection and should not be used as the sole basis for treatment or other patient management decisions.  A negative result may occur with improper specimen collection / handling, submission of specimen other than  nasopharyngeal swab, presence of viral mutation(s) within the areas targeted by this assay, and inadequate number of viral copies (<250 copies / mL). A negative result must be combined with clinical observations, patient history, and epidemiological information.  Fact Sheet for Patients:   StrictlyIdeas.no  Fact Sheet for Healthcare Providers: BankingDealers.co.za  This test is not yet approved or  cleared by the [place] FDA and has been authorized for detection and/or diagnosis of SARS-CoV-2 by FDA under an Emergency Use Authorization (EUA).  This EUA will remain in effect (meaning this test can be used) for the duration of the COVID-19 declaration under Section 564(b)(1) of the Act, 21 U.S.C. section 360bbb-3(b)(1), unless the authorization is terminated or revoked sooner.  Performed at [facility], [address] [place], [place] [address]   MRSA PCR Screening     Status: None   Collection Time: 11/21/20 10:08 PM   Specimen: Nasal Mucosa; Nasopharyngeal  Result Value Ref Range Status   MRSA by PCR NEGATIVE NEGATIVE Final    Comment:        The GeneXpert MRSA Assay (FDA approved for NASAL specimens only), is one component of a comprehensive MRSA colonization surveillance program. It is not intended to diagnose MRSA infection nor to guide or monitor treatment for MRSA infections. Performed at [facility], [address] [place], [place] [address]          Radiology Studies: DG Pelvis 1-2 Views  Result Date: 11/21/2020 CLINICAL DATA:  Pelvic tenderness EXAM: PELVIS - 1-2 VIEW COMPARISON:  None. FINDINGS: There is no evidence of pelvic fracture or diastasis. No pelvic bone lesions are seen. Vascular calcifications noted IMPRESSION: No acute osseous abnormality. Electronically Signed   By: Suzy Bouchard M.D.   On: 11/21/2020 13:43   CT Head Wo Contrast  Result Date: 11/21/2020 CLINICAL DATA:  Being treated for  UTI, increased weakness and falling. EXAM: CT HEAD WITHOUT CONTRAST  CT CERVICAL SPINE WITHOUT CONTRAST TECHNIQUE: Multidetector CT imaging of the head and cervical spine was performed following the standard protocol without intravenous contrast. Multiplanar CT image reconstructions of the cervical spine were also generated. COMPARISON:  CT head and cervical spine October 23, 2018. FINDINGS: CT HEAD FINDINGS Brain: No evidence of acute infarction, hemorrhage, hydrocephalus, extra-axial collection or mass lesion/mass effect. Similar global parenchymal volume loss with ex vacuo dilatation of ventricular system. Mild burden of chronic small vessel white matter disease. Vascular: No hyperdense vessel. Vascular calcifications. 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 Skull: Normal. Negative for fracture or focal lesion. Sinuses/Orbits: No acute finding. Other: None CT CERVICAL SPINE FINDINGS Alignment: Unchanged moderate kyphosis at C5. 3 mm anterolisthesis of C3 on C4, unchanged. Skull base and vertebrae: Negative for fracture Soft tissues and spinal canal: No prevertebral fluid or swelling. No visible canal hematoma. Atherosclerotic calcifications of the carotid arteries bilaterally. Disc levels: Multilevel disc and facet degeneration. Similar cervical spondylosis and spinal stenosis at C4-C5, C5-C6 and C6-C7. Upper chest: Negative Other: None IMPRESSION: 1. No acute intracranial pathology. Similar atrophy and chronic microvascular ischemic changes noted. 2. No acute fracture or static subluxation of the cervical spine. Similar cervical spondylosis as above. Electronically Signed   By: Dahlia Bailiff MD   On: 11/21/2020 17:48   CT Cervical Spine Wo Contrast  Result Date: 11/21/2020 CLINICAL DATA:  Being treated for UTI, increased weakness and falling. EXAM: CT HEAD WITHOUT CONTRAST CT CERVICAL SPINE WITHOUT CONTRAST TECHNIQUE: Multidetector CT imaging of the head and cervical spine was performed  following the standard protocol without intravenous contrast. Multiplanar CT image reconstructions of the cervical spine were also generated. COMPARISON:  CT head and cervical spine October 23, 2018. FINDINGS: CT HEAD FINDINGS Brain: No evidence of acute infarction, hemorrhage, hydrocephalus, extra-axial collection or mass lesion/mass effect. Similar global parenchymal volume loss with ex vacuo dilatation of ventricular system. Mild burden of chronic small vessel white matter disease. Vascular: No hyperdense vessel. Vascular calcifications. 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 Skull: Normal. Negative for fracture or focal lesion. Sinuses/Orbits: No acute finding. Other: None CT CERVICAL SPINE FINDINGS Alignment: Unchanged moderate kyphosis at C5. 3 mm anterolisthesis of C3 on C4, unchanged. Skull base and vertebrae: Negative for fracture Soft tissues and spinal canal: No prevertebral fluid or swelling. No visible canal hematoma. Atherosclerotic calcifications of the carotid arteries bilaterally. Disc levels: Multilevel disc and facet degeneration. Similar cervical spondylosis and spinal stenosis at C4-C5, C5-C6 and C6-C7. Upper chest: Negative Other: None IMPRESSION: 1. No acute intracranial pathology. Similar atrophy and chronic microvascular ischemic changes noted. 2. No acute fracture or static subluxation of the cervical spine. Similar cervical spondylosis as above. Electronically Signed   By: Dahlia Bailiff MD   On: 11/21/2020 17:48   CT CHEST ABDOMEN PELVIS W CONTRAST  Result Date: 11/21/2020 CLINICAL DATA:  86 year old male with multiple falls and abdominal trauma. EXAM: CT CHEST, ABDOMEN, AND PELVIS WITH CONTRAST TECHNIQUE: Multidetector CT imaging of the chest, abdomen and pelvis was performed following the standard protocol during bolus administration of intravenous contrast. CONTRAST:  125mL OMNIPAQUE IOHEXOL 300 MG/ML  SOLN COMPARISON:  CT abdomen pelvis dated 10/05/2020.  FINDINGS: CT CHEST FINDINGS Cardiovascular: Top-normal cardiac size. No pericardial effusion. Three-vessel coronary vascular calcification. Advanced atherosclerotic calcification of the thoracic aorta. No aneurysmal dilatation or dissection. The origins of the great vessels of the aortic arch appear patent. The central pulmonary arteries are grossly unremarkable. Mediastinum/Nodes: No hilar or mediastinal adenopathy.  The esophagus and the thyroid gland are grossly unremarkable. No mediastinal fluid collection. Lungs/Pleura: Bilateral calcified pleural plaques, likely sequela of prior asbestos exposure. No focal consolidation, pleural effusion, or pneumothorax. The central airways are patent. Musculoskeletal: Osteopenia with degenerative changes of the spine. Several nondisplaced left anterior rib fractures involving the third, fourth, fifth and, likely sixth ribs. CT ABDOMEN PELVIS FINDINGS No intra-abdominal free air or free fluid. Hepatobiliary: Subcentimeter left hepatic hypodense lesion is too small to characterize. The liver is otherwise unremarkable. No intrahepatic biliary ductal dilatation. Cholecystectomy. Pancreas: Unremarkable. No pancreatic ductal dilatation or surrounding inflammatory changes. Spleen: Normal in size without focal abnormality. Adrenals/Urinary Tract: The adrenal glands unremarkable. There is no hydronephrosis on either side. There is symmetric enhancement and excretion of contrast by both kidneys. The visualized ureters and urinary bladder appear unremarkable. Stomach/Bowel: Small scattered sigmoid diverticula without active inflammatory changes. There is no bowel obstruction or active inflammation. The appendix is normal. Vascular/Lymphatic: Advanced aortoiliac atherosclerotic disease. The IVC is unremarkable. No portal venous gas. There is no adenopathy. Reproductive: The prostate and seminal vesicles are grossly unremarkable. No pelvic masses Other: Small fat containing bilateral  inguinal hernias. No fluid collection. Musculoskeletal: Osteopenia with degenerative changes of the spine. No acute osseous pathology. IMPRESSION: 1. Several nondisplaced left anterior rib fractures. No pneumothorax. 2. No acute/traumatic intra-abdominal or pelvic pathology. 3. Aortic Atherosclerosis (ICD10-I70.0). Electronically Signed   By: Anner Crete M.D.   On: 11/21/2020 17:47   DG Chest Portable 1 View  Result Date: 11/21/2020 CLINICAL DATA:  Multiple falls. EXAM: PORTABLE CHEST 1 VIEW COMPARISON:  Chest x-ray 06/24/2020 FINDINGS: Mild cardiac enlargement. Moderate tortuosity and calcification of the thoracic aorta. 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 There is moderate eventration of the right hemidiaphragm. No infiltrates, edema or effusions. No pneumothorax. No acute bony findings.  No obvious acute rib fractures. IMPRESSION: 1. Mild cardiac enlargement. 2. 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 3. No acute cardiopulmonary findings. 4. Grossly intact bony thorax. Electronically Signed   By: Marijo Sanes M.D.   On: 11/21/2020 13:38   ECHOCARDIOGRAM COMPLETE  Result Date: 11/22/2020    ECHOCARDIOGRAM REPORT   Patient Name:   Julian Fowler Date of Exam: 11/22/2020 Medical Rec #:  [id number]       Height:       69.0 in Accession #:    [id number]      Weight:       242.5 lb Date of Birth:  April 15, 1935       BSA:          2.242 m Patient Age:    71 years        BP:           100/55 mmHg Patient Gender: M               HR:           44 bpm. Exam Location:  [facility] Procedure: 2D Echo Indications:    Atrial Fibrillation I48.91  History:        Patient has prior history of Echocardiogram examinations, most                 recent 09/06/2020. Arrythmias:Atrial Fibrillation,                 Signs/Symptoms:Chest Pain, Syncope and Murmur; Risk                 Factors:Hypertension, Dyslipidemia  and  Former Smoker.  Sonographer:    Leavy Cella RDCS (AE) Referring Phys: [id number] ASIA B [place]  1. Left ventricular ejection fraction, by estimation, is 60 to 65%. The left ventricle has normal function. The left ventricle has no regional wall motion abnormalities. There is moderate left ventricular hypertrophy. Left ventricular diastolic parameters are indeterminate.  2. Right ventricular systolic function is normal. The right ventricular size is normal.  3. The mitral valve is normal in structure. No evidence of mitral valve regurgitation. No evidence of mitral stenosis.  4. The aortic valve is tricuspid. There is moderate calcification of the aortic valve. There is moderate thickening of the aortic valve. Aortic valve regurgitation is not visualized. Mild aortic valve stenosis. Aortic valve mean gradient measures 10.3 mmHg. Aortic valve peak gradient measures 18.8 mmHg. Aortic valve area, by VTI measures 1.78 cm.  5. The inferior vena cava is normal in size with greater than 50% respiratory variability, suggesting right atrial pressure of 3 mmHg. FINDINGS  Left Ventricle: Left ventricular ejection fraction, by estimation, is 60 to 65%. The left ventricle has normal function. The left ventricle has no regional wall motion abnormalities. The left ventricular internal cavity size was normal in size. There is  moderate left ventricular hypertrophy. Left ventricular diastolic function could not be evaluated due to atrial fibrillation. Left ventricular diastolic parameters are indeterminate. Right Ventricle: The right ventricular size is normal. No increase in right ventricular wall thickness. Right ventricular systolic function is normal. Left Atrium: Left atrial size was normal in size. Right Atrium: Right atrial size was normal in size. Pericardium: There is no evidence of pericardial effusion. Mitral Valve: The mitral valve is normal in structure. There is mild thickening of the mitral valve  leaflet(s). There is mild calcification of the mitral valve leaflet(s). Mild to moderate mitral annular calcification. No evidence of mitral valve regurgitation. No evidence of mitral valve stenosis. Tricuspid Valve: The tricuspid valve is normal in structure. Tricuspid valve regurgitation is trivial. No evidence of tricuspid stenosis. Aortic Valve: The aortic valve is tricuspid. There is moderate calcification of the aortic valve. There is moderate thickening of the aortic valve. There is moderate aortic valve annular calcification. Aortic valve regurgitation is not visualized. Mild aortic stenosis is present. Aortic valve mean gradient measures 10.3 mmHg. Aortic valve peak gradient measures 18.8 mmHg. Aortic valve area, by VTI measures 1.78 cm. Pulmonic Valve: The pulmonic valve was not well visualized. Pulmonic valve regurgitation is not visualized. No evidence of pulmonic stenosis. Aorta: The aortic root is normal in size and structure. Pulmonary Artery: Indeterminant PASP, inadequate TR jet. Venous: The inferior vena cava is normal in size with greater than 50% respiratory variability, suggesting right atrial pressure of 3 mmHg. IAS/Shunts: No atrial level shunt detected by color flow Doppler.  LEFT VENTRICLE PLAX 2D LVIDd:         3.46 cm  Diastology LVIDs:         2.28 cm  LV e' medial:    0.10 cm/s LV PW:         1.40 cm  LV E/e' medial:  8.7 LV IVS:        1.40 cm  LV e' lateral:   0.10 cm/s LVOT diam:     2.00 cm  LV E/e' lateral: 9.3 LV SV:         69 LV SV Index:   31 LVOT Area:     3.14 cm  RIGHT VENTRICLE RV S prime:  10.40 cm/s LEFT ATRIUM           Index       RIGHT ATRIUM           Index LA diam:      4.90 cm 2.19 cm/m  RA Area:     19.40 cm LA Vol (A2C): 26.0 ml 11.60 ml/m RA Volume:   44.20 ml  19.71 ml/m LA Vol (A4C): 49.7 ml 22.16 ml/m  AORTIC VALVE AV Area (Vmax):    1.66 cm AV Area (Vmean):   1.83 cm AV Area (VTI):     1.78 cm AV Vmax:           217.00 cm/s AV Vmean:           149.667 cm/s AV VTI:            0.387 m AV Peak Grad:      18.8 mmHg AV Mean Grad:      10.3 mmHg LVOT Vmax:         115.00 cm/s LVOT Vmean:        87.200 cm/s LVOT VTI:          0.220 m LVOT/AV VTI ratio: 0.57  AORTA Ao Root diam: 2.90 cm MITRAL VALVE MV Area (PHT): 3.19 cm   SHUNTS MV Decel Time: 238 msec   Systemic VTI:  0.22 m MV E velocity: 0.90 cm/s  Systemic Diam: 2.00 cm Carlyle Dolly MD Electronically signed by Carlyle Dolly MD Signature Date/Time: 11/22/2020/10:59:48 AM    Final         Scheduled Meds: . alfuzosin  10 mg Oral QHS  . Chlorhexidine Gluconate Cloth  6 each Topical Daily  . [START ON 11/23/2020] furosemide  20 mg Oral Daily  . hydrALAZINE  75 mg Oral TID  . losartan  100 mg Oral q morning  . mirabegron ER  25 mg Oral Daily  . mometasone-formoterol  2 puff Inhalation BID  . pravastatin  40 mg Oral QHS  . tamsulosin  0.4 mg Oral Daily   Continuous Infusions: . sodium chloride 75 mL/hr at 11/22/20 0651  . heparin 1,550 Units/hr (11/22/20 1158)     LOS: 1 day    Time spent: 15mins    Kathie Dike, MD [facility]   If 7PM-7AM, please contact night-coverage [email or web address]  11/22/2020, 10:04 PM

## 2020-11-22 NOTE — Evaluation (Signed)
Physical Therapy Evaluation Patient Details Name: Julian Fowler MRN: [id number] DOB: 02-09-1935 Today's Date: 11/22/2020   History of Present Illness  Julian Fowler  is a 86 y.o. male, with history of obstructive sleep apnea, paroxysmal supraventricular tachycardia, hyperlipidemia, chronic lung disease, GERD, hypertension, BPH, and more presents to the ED with a chief complaint of weakness and falls.  Wife reports that symptoms generally started 10 days ago.  She said he had fever and chills, and then 9 days ago he started have urinary retention and constipation.  Urinary retention was followed by urinary frequency with malodorous urine and dark-colored urine.  7 days ago they went to the PCP and Bactrim was prescribed.  Patient then developed nausea and vomiting which they attributed to Bactrim.  The same day that he went to the PCP he fell in the bathroom and hit the toilet on the way down hurting his left chest.  PCP called him the next day and said that his sodium was mildly low at 131 and he was supposed to get it redrawn today.  3 days ago patient had an acute change with increased generalized weakness decrease in appetite continued nausea and vomiting and another fall that was presyncopal.  Patient is usually able to ambulate with his walker, but wife has been having to hold his walker for him because he has been so weak.  He reports that for the last 2 days he has not been able to walk at all.  3 days ago when he had that acute change they called EMS.  EMS advised that hospitals have long wait times right now because of COVID, so they decided not to get transported.  Patient then became confused today and they decided to come into the ER.    Clinical Impression  Patient demonstrates slow labored movement for sitting up at bedside requiring Min assist to help pull self to sitting, slightly unsteady on feet using RW with occasional drifting left/right during ambulation without loss of balance, able  to transfer to commode in room, but has difficulty completing sit to stands from commode due to BLE weakness.  Patient tolerated sitting up in chair after therapy - RN aware.  Patient will benefit from continued physical therapy in hospital and recommended venue below to increase strength, balance, endurance for safe ADLs and gait.      Follow Up Recommendations SNF;Supervision for mobility/OOB;Supervision - Intermittent    Equipment Recommendations  None recommended by PT    Recommendations for Other Services       Precautions / Restrictions Precautions Precautions: Fall Restrictions Weight Bearing Restrictions: No      Mobility  Bed Mobility Overal bed mobility: Needs Assistance Bed Mobility: Supine to Sit     Supine to sit: Min assist     General bed mobility comments: increased time, labored movement    Transfers Overall transfer level: Needs assistance Equipment used: Rolling walker (2 wheeled) Transfers: Sit to/from [facility] Sit to Stand: Min guard;Min assist Stand pivot transfers: Min guard;Min assist       General transfer comment: increased time, labored movement  Ambulation/Gait Ambulation/Gait assistance: Min guard;Min assist Gait Distance (Feet): 50 Feet Assistive device: Rolling walker (2 wheeled) Gait Pattern/deviations: Decreased step length - right;Decreased step length - left;Decreased stride length Gait velocity: decreased   General Gait Details: slightly labored cadence with occasional drifting left/right without loss of balance, limited secondary to fatigue  Stairs  Wheelchair Mobility    Modified Rankin (Stroke Patients Only)       Balance Overall balance assessment: Needs assistance Sitting-balance support: Feet supported;No upper extremity supported Sitting balance-Leahy Scale: Good Sitting balance - Comments: seated at EOB   Standing balance support: During functional activity;Bilateral upper  extremity supported Standing balance-Leahy Scale: Fair Standing balance comment: using RW                             Pertinent Vitals/Pain Pain Assessment: No/denies pain    Home Living Family/patient expects to be discharged to:: Private residence Living Arrangements: Spouse/significant other Available Help at Discharge: Family;Available PRN/intermittently Type of Home: House Home Access: Stairs to enter Entrance Stairs-Rails: None Entrance Stairs-Number of Steps: 1 Home Layout: One level;Laundry or work area in basement;Able to live on main level with bedroom/bathroom;Two level Home Equipment: Walker - 2 wheels;Cane - single point;Grab bars - tub/shower      Prior Function Level of Independence: Independent with assistive device(s)         Comments: household and short distanced communty ambulator using RW     Hand Dominance        Extremity/Trunk Assessment   Upper Extremity Assessment Upper Extremity Assessment: Generalized weakness    Lower Extremity Assessment Lower Extremity Assessment: Generalized weakness    Cervical / Trunk Assessment Cervical / Trunk Assessment: Normal  Communication   Communication: No difficulties  Cognition Arousal/Alertness: Awake/alert Behavior During Therapy: WFL for tasks assessed/performed Overall Cognitive Status: Within Functional Limits for tasks assessed                                        General Comments      Exercises     Assessment/Plan    PT Assessment Patient needs continued PT services  PT Problem List Decreased strength;Decreased activity tolerance;Decreased balance;Decreased mobility       PT Treatment Interventions DME instruction;Gait training;Stair training;Functional mobility training;Therapeutic activities;Therapeutic exercise;Patient/family education;Balance training    PT Goals (Current goals can be found in the Care Plan section)  Acute Rehab PT Goals Patient  Stated Goal: return home with family to assist PT Goal Formulation: With patient/family Time For Goal Achievement: 12/06/20 Potential to Achieve Goals: Good    Frequency Min 3X/week   Barriers to discharge        Co-evaluation               AM-PAC PT "6 Clicks" Mobility  Outcome Measure Help needed turning from your back to your side while in a flat bed without using bedrails?: A Little Help needed moving from lying on your back to sitting on the side of a flat bed without using bedrails?: A Little Help needed moving to and from a bed to a chair (including a wheelchair)?: A Little Help needed standing up from a chair using your arms (e.g., wheelchair or bedside chair)?: A Little Help needed to walk in hospital room?: A Little Help needed climbing 3-5 steps with a railing? : A Lot 6 Click Score: 17    End of Session   Activity Tolerance: Patient tolerated treatment well;Patient limited by fatigue Patient left: in chair;with call bell/phone within reach Nurse Communication: Mobility status PT Visit Diagnosis: Unsteadiness on feet (R26.81);Other abnormalities of gait and mobility (R26.89);Muscle weakness (generalized) (M62.81)    Time: 5993-5701 PT Time Calculation (min) (ACUTE  ONLY): 34 min   Charges:   PT Evaluation $PT Eval Moderate Complexity: 1 Mod PT Treatments $Therapeutic Activity: 23-37 mins        3:00 PM, 11/22/20 Lonell Grandchild, MPT Physical Therapist with [facility] [phone] office [phone] mobile phone

## 2020-11-22 NOTE — Progress Notes (Signed)
ANTICOAGULATION CONSULT NOTE -   Pharmacy Consult for heparin gtt  Indication: atrial fibrillation  Allergies  Allergen Reactions  . Prednisone     Pt is high functioning and out of sorts.  . Codeine Other (See Comments)    REACTION: groggy  . Tape Other (See Comments)    Skin tears, use paper tape    Patient Measurements: Height: 5\' 9"  (175.3 cm) Weight: 110 kg (242 lb 8.1 oz) IBW/kg (Calculated) : 70.7 HEPARIN DW (KG): 95.5   Vital Signs: Temp: 98.7 F (37.1 C) (02/08 1641) Temp Source: Oral (02/08 1641) BP: 129/58 (02/08 1400) Pulse Rate: 68 (02/08 1400)  Labs: Recent Labs    11/21/20 1247 11/22/20 0546 11/22/20 1636  HGB 12.6* 11.6*  --   HCT 35.7* 33.4*  --   PLT 426* 434*  --   HEPARINUNFRC  --  0.62 0.58  CREATININE 1.01 0.82  --     Estimated Creatinine Clearance: 83.4 mL/min (by C-G formula based on SCr of 0.82 mg/dL).   Medical History: Past Medical History:  Diagnosis Date  . Allergic rhinitis   . Aortic stenosis   . Arthritis   . BPH (benign prostatic hyperplasia)   . Coronary artery calcification seen on CT scan   . Easy bruisability   . ED (erectile dysfunction)   . Essential hypertension   . GERD (gastroesophageal reflux disease)   . H/O hiatal hernia   . Hearing loss in left ear   . History of asbestosis   . History of shingles   . Hyperlipidemia   . Overactive bladder   . PSVT (paroxysmal supraventricular tachycardia) ([place])   . Sleep apnea    No longer on CPAP following weight loss  . Tinnitus     Medications:  Medications Prior to Admission  Medication Sig Dispense Refill Last Dose  . acetaminophen (TYLENOL) 325 MG tablet Take 325 mg by mouth as needed.   Past Week at Unknown time  . alfuzosin (UROXATRAL) 10 MG 24 hr tablet Take 1 tablet (10 mg total) by mouth at bedtime. 30 tablet 11 Past Month at Unknown time  . azelastine (ASTELIN) 0.1 % nasal spray 1-2 puffs each nostril every 8 hours if needed for drainage (Patient  taking differently: as needed. 1-2 puffs each nostril every 8 hours if needed for drainage) 30 mL 12 Past Month at Unknown time  . budesonide-formoterol (SYMBICORT) 160-4.5 MCG/ACT inhaler Inhale 2 puffs then rinse mouth twice daily 1 Inhaler 12 11/20/2020 at Unknown time  . Calcium Carb-Vit D-C-E-Mineral (OS-CAL ULTRA) 600 MG TABS Take 1 tablet by mouth daily.    11/20/2020 at Unknown time  . diclofenac Sodium (VOLTAREN) 1 % GEL Apply 2 g topically daily as needed (For pain).   11/20/2020 at Unknown time  . diltiazem (CARDIZEM CD) 120 MG 24 hr capsule Take 1 capsule by mouth twice daily 180 capsule 3 11/20/2020 at Unknown time  . esomeprazole (NEXIUM) 40 MG capsule Take 80 mg by mouth 2 (two) times daily before a meal.   11/20/2020 at Unknown time  . fexofenadine (ALLEGRA) 180 MG tablet Take 180 mg by mouth daily as needed for allergies.   Past Week at Unknown time  . fluticasone (FLONASE) 50 MCG/ACT nasal spray Place 2 sprays into both nostrils daily. (Patient taking differently: Place 2 sprays into both nostrils daily as needed for allergies.) 18.2 g 12 Past Week at Unknown time  . furosemide (LASIX) 20 MG tablet Take 1 tablet (20 mg total) by  mouth as needed. (Patient taking differently: Take 20 mg by mouth daily as needed for fluid.)   Past Week at Unknown time  . hydrALAZINE (APRESOLINE) 50 MG tablet Take 1.5 tablets (75 mg total) by mouth 3 (three) times daily. 410 tablet 3 11/20/2020 at Unknown time  . latanoprost (XALATAN) 0.005 % ophthalmic solution Place 1 drop into both eyes at bedtime.    11/20/2020 at Unknown time  . LORazepam (ATIVAN) 1 MG tablet Take 1 tablet by mouth three times daily as needed (Patient taking differently: Take 1 mg by mouth 3 (three) times daily as needed for anxiety.) 180 tablet 1 11/20/2020 at Unknown time  . losartan (COZAAR) 100 MG tablet Take 100 mg by mouth every morning.    11/20/2020 at Unknown time  . Multiple Vitamins-Minerals (CENTRUM SILVER PO) Take 1 tablet by mouth daily.     11/20/2020 at Unknown time  . Polyethyl Glycol-Propyl Glycol (SYSTANE OP) Place 1 drop into both eyes daily.   11/20/2020 at Unknown time  . polyethylene glycol (MIRALAX / GLYCOLAX) packet Take 17 g by mouth at bedtime.   11/20/2020 at Unknown time  . potassium chloride SA (KLOR-CON) 20 MEQ tablet Take 1 tablet (20 mEq total) by mouth 2 (two) times daily. 180 tablet 1 11/20/2020 at Unknown time  . pravastatin (PRAVACHOL) 40 MG tablet Take 1 tablet by mouth at bedtime.    11/20/2020 at Unknown time  . PROAIR HFA 108 (90 Base) MCG/ACT inhaler INHALE 2 PUFFS BY MOUTH EVERY 6 HOURS AS NEEDED FOR WHEEZING FOR SHORTNESS OF BREATH (Patient taking differently: Inhale 1 puff into the lungs every 6 (six) hours as needed.) 18 g 12 11/21/2020 at Unknown time  . sulfamethoxazole-trimethoprim (BACTRIM DS) 800-160 MG tablet Take 1 tablet by mouth 2 (two) times daily.   Past Week at Unknown time  . Wheat Dextrin (BENEFIBER) POWD Take 1 packet by mouth 2 (two) times daily.   11/20/2020 at Unknown time  . Zinc 25 MG TABS Take 1 tablet by mouth daily.   11/20/2020 at Unknown time  . zolpidem (AMBIEN) 10 MG tablet Take 1 tablet by mouth at bedtime.    11/20/2020 at Unknown time   Scheduled:  . alfuzosin  10 mg Oral QHS  . Chlorhexidine Gluconate Cloth  6 each Topical Daily  . [START ON 11/23/2020] furosemide  20 mg Oral Daily  . hydrALAZINE  75 mg Oral TID  . losartan  100 mg Oral q morning  . mirabegron ER  25 mg Oral Daily  . mometasone-formoterol  2 puff Inhalation BID  . pravastatin  40 mg Oral QHS  . tamsulosin  0.4 mg Oral Daily   Infusions:  . sodium chloride 75 mL/hr at 11/22/20 0651  . heparin 1,550 Units/hr (11/22/20 1158)   PRN: acetaminophen **OR** acetaminophen, albuterol, guaiFENesin-dextromethorphan, iohexol, LORazepam, ondansetron **OR** ondansetron (ZOFRAN) IV, oxyCODONE, zolpidem Anti-infectives (From admission, onward)   None      Assessment: Julian Fowler a 86 y.o. male requires anticoagulation with  a heparin iv infusion for the indication of  atrial fibrillation. Heparin gtt  started following pharmacy protocol per pharmacy consult. Patient is not on previous oral anticoagulant that will require aPTT/HL correlation before transitioning to only HL monitoring.  HL 0.58 (therapeutic)  Goal of Therapy:  Heparin level 0.3-0.7 units/ml Monitor platelets by anticoagulation protocol: Yes   Plan:  Continue  heparin infusion at 1550 units/hr Check anti-Xa level  daily while on heparin Continue to monitor H&H and platelets  Assunta Curtis, [place] [phone]

## 2020-11-22 NOTE — Progress Notes (Signed)
CRITICAL VALUE ALERT  Critical Value: sodium 116  Date & Time Notied:  11/22/20 1750  Provider Notified: Dr. Roderic Palau  Orders Received/Actions taken: see orders

## 2020-11-22 NOTE — TOC Initial Note (Signed)
Transition of Care [facility]) - Initial/Assessment Note    Patient Details  Name: Julian Fowler MRN: [id number] Date of Birth: February 11, 1935  Transition [facility]) CM/SW Contact:    Boneta Lucks, RN Phone Number: 11/22/2020, 2:00 PM  Clinical Narrative:          Patient  Admitted with Acute hyponatremia. Patient lives at home with his wife. Lucita Ferrara is providing history. She states one week ago patient was independent, he driving, house work, ADL's. After a UTI became very confused and did not eat or drink causing hyponatremia. PT is recommending SNF. Lucita Ferrara states patient is getting better and she does not want SNF, she is requesting [facility] for RN/ PT. MD and RN updated. Vaughan Basta accepted the referral, added to AVS.         Expected Discharge Plan: [place] Barriers to Discharge: Continued Medical Work up   Patient Goals and CMS Choice Patient states their goals for this hospitalization and ongoing recovery are:: to go home. CMS Medicare.gov Compare Post Acute Care list provided to:: Patient Represenative (must comment) Choice offered to / list presented to : Spouse  Expected Discharge Plan and Services Expected Discharge Plan: [place]    Living arrangements for the past 2 months: [place]: RN,PT [place] Agency: [place] ([place]) Date [place]: 11/22/20 Time [place]: [place] Representative spoke with at [place]: [place] Arrangements/Services Living arrangements for the past 2 months: [place] with:: Spouse Patient language and need for interpreter reviewed:: Yes Do you feel safe going back to the place where you live?: Yes      Need for Family Participation in Patient Care: Yes (Comment) Care giver support system in place?: Yes (comment)   Criminal Activity/Legal Involvement Pertinent to Current Situation/Hospitalization: No - Comment as  needed  Activities of Daily Living Home Assistive Devices/Equipment: Eyeglasses,Blood pressure cuff ADL Screening (condition at time of admission) Patient's cognitive ability adequate to safely complete daily activities?: Yes Is the patient deaf or have difficulty hearing?: Yes (hearing aides) Does the patient have difficulty seeing, even when wearing glasses/contacts?: No Does the patient have difficulty concentrating, remembering, or making decisions?: No Patient able to express need for assistance with ADLs?: Yes Does the patient have difficulty dressing or bathing?: No Independently performs ADLs?: Yes (appropriate for developmental age) Does the patient have difficulty walking or climbing stairs?: No Weakness of Legs: None Weakness of Arms/Hands: None  Permission Sought/Granted      Share Information with NAME: Lucita Ferrara     Permission granted to share info w Relationship: Wife     Emotional Assessment      Orientation: : Oriented to Self,Oriented to Situation,Oriented to Place Alcohol / Substance Use: Not Applicable Psych Involvement: No (comment)  Admission diagnosis:  Acute hyponatremia [E87.1] Confusion [R41.0] Hyponatremia [E87.1] Weakness [R53.1] Fall [W19.XXXA] New onset a-fib ([place]) [I48.91] Symptomatic bradycardia [R00.1] Closed fracture of multiple ribs of left side, initial encounter [S22.42XA] Patient Active Problem List   Diagnosis Date Noted  . Acute hyponatremia 11/21/2020  . Falls 11/21/2020  . Rib fractures 11/21/2020  . Erectile dysfunction due to arterial insufficiency 10/19/2020  . Elevated PSA 10/19/2020  . Benign prostatic hyperplasia with urinary obstruction 10/19/2020  . Weak urinary stream 10/19/2020  . Abdominal pain 09/22/2020  . ILD (interstitial lung disease) ([place]) 12/23/2018  . Bronchitis 10/03/2018  .  Acute sinusitis 10/03/2018  . Localized swelling of lower extremity 02/19/2018  . Asbestosis ([place]) 08/14/2017  . Rectal bleeding  01/24/2017  . Hemorrhoids 12/18/2016  . BRBPR (bright red blood per rectum) 12/18/2016  . 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 Peripheral edema 10/15/2013  . Hyponatremia 07/07/2013  . Hypokalemia 07/07/2013  . OA (osteoarthritis) of knee 07/06/2013  . Preop cardiovascular exam 06/26/2013  . Chronic insomnia 09/29/2012  . Cardiac murmur 08/27/2012  . Hearing impaired 04/10/2012  . Easy bruisability   . Anxiety   . Hyperlipidemia   . Abdominal aorta injury   . Overactive bladder   . ED (erectile dysfunction)   . New onset atrial fibrillation ([place])   . OSA (obstructive sleep apnea)   . Chest tightness   . Ejection fraction   . Essential hypertension 12/29/2009  . Syncope 09/14/2009  . TOBACCO ABUSE, HX OF 07/15/2009  . BACK PAIN 06/22/2009  . INTESTINAL GAS 06/22/2009  . 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 Seasonal and perennial allergic rhinitis 01/15/2008  . ECZEMA 11/18/2007  . Asthma, mild intermittent, well-controlled 11/03/2007  . GERD 04/14/2007  . ARTHRITIS 04/14/2007  . KNEE PAIN, LEFT 04/14/2007   PCP:  [name], MD Pharmacy:   [facility] [address] [place] [address] Phone: [phone] Fax: [place], [place] [address] Phone: [phone] Fax: [place], [address] [address] Phone: [phone] Fax: [phone]   Readmission Risk Interventions Readmission Risk Prevention Plan 11/22/2020  Transportation Screening Complete  Home Care Screening Complete  Medication Review (RN CM) Complete  Some recent data might be hidden

## 2020-11-22 NOTE — Consult Note (Signed)
Cardiology Consultation:   Patient ID: Julian Fowler MRN: [id number]; DOB: [birth year]  Admit date: 11/21/2020 Date of Consult: 11/22/2020  Primary Care Provider: [name], MD [facility] Cardiologist: Rozann Lesches, MD  [place] Electrophysiologist:  None    Patient Profile:   Julian Fowler is a 86 y.o. male with a hx of PSVT, mild AS, coronary calcification on CT who is being seen today for the evaluation of new Afib at the request of Dr. Roderic Palau.  History of Present Illness:   Julian Fowler with histoyr of PSVT, HTN, coronary calcifcation on CT 06/2020, asbestosis and chronic lung disease, OSA. Patient had UTI a week ago treated with Bactirm. Since then had weakness and falls. Had N/V from bactrim and fell and hit his chest on toilet. Na 131 by PCP. He continued with progressive weakness, presyncope and confusion so brought in. Na 11Found to be in new AFib rate 75/m, bradycardia in ED-diltiazem held.CT several rib fractures. TSH low, Troponin negative   Past Medical History:  Diagnosis Date  . Allergic rhinitis   . Aortic stenosis   . Arthritis   . BPH (benign prostatic hyperplasia)   . Coronary artery calcification seen on CT scan   . Easy bruisability   . ED (erectile dysfunction)   . Essential hypertension   . GERD (gastroesophageal reflux disease)   . H/O hiatal hernia   . Hearing loss in left ear   . History of asbestosis   . History of shingles   . Hyperlipidemia   . Overactive bladder   . PSVT (paroxysmal supraventricular tachycardia) ([place])   . Sleep apnea    No longer on CPAP following weight loss  . Tinnitus     Past Surgical History:  Procedure Laterality Date  . BACK SURGERY  2014   herniated L1, L2   Dr Julian Fowler  . BRAIN SURGERY    . CEREBRAL EMBOLIZATION  12/2011   "radiation therapy-did not work"  . CHOLECYSTECTOMY  6/98  . COLONOSCOPY  09/2009   Dr. Lindalou Hose: normal, internal hemorrhoids   . COLONOSCOPY N/A 02/28/2017   Dr. Gala Romney:  Hemorrhoids, grade 3, mild diverticulosis.  Marland Kitchen CRANIECTOMY FOR EXCISION OF ACOUSTIC NEUROMA  3/95  . MINOR AMPUTATION OF DIGIT Left 12/31/2018   Procedure: REVISION AMPUTATION OF LEFT INDEX FINGER, IRRIGATION AND DEBRIDEMENT LEFT INDEX FINGER;  Surgeon: Verner Mould, MD;  Location: [place];  Service: Orthopedics;  Laterality: Left;  . RADIOLOGY WITH ANESTHESIA N/A 07/07/2014   Procedure: EMBOLIZATION;  Surgeon: Rob Hickman, MD;  Location: [place];  Service: Radiology;  Laterality: N/A;  . TOTAL KNEE ARTHROPLASTY Left 07/06/2013   Procedure: LEFT TOTAL KNEE ARTHROPLASTY;  Surgeon: Gearlean Alf, MD;  Location: [facility];  Service: Orthopedics;  Laterality: Left;  . TOTAL KNEE ARTHROPLASTY Right 01/18/2014   Procedure: RIGHT TOTAL KNEE ARTHROPLASTY;  Surgeon: Gearlean Alf, MD;  Location: [facility];  Service: Orthopedics;  Laterality: Right;  . TRIGGER FINGER RELEASE  2003   (thumb) middle finger (2006)     Home Medications:  Prior to Admission medications   Medication Sig Start Date End Date Taking? Authorizing Provider  acetaminophen (TYLENOL) 325 MG tablet Take 325 mg by mouth as needed.   Yes [provider]  alfuzosin (UROXATRAL) 10 MG 24 hr tablet Take 1 tablet (10 mg total) by mouth at bedtime. 09/05/20  Yes McKenzie, Candee Furbish, MD  azelastine (ASTELIN) 0.1 % nasal spray 1-2 puffs each nostril every 8 hours  if needed for drainage Patient taking differently: as needed. 1-2 puffs each nostril every 8 hours if needed for drainage 12/17/17  Yes Young, Tarri Fuller D, MD  budesonide-formoterol [facility]) 160-4.5 MCG/ACT inhaler Inhale 2 puffs then rinse mouth twice daily 12/23/19  Yes Young, Clinton D, MD  Calcium Carb-Vit D-C-E-Mineral (OS-CAL ULTRA) 600 MG TABS Take 1 tablet by mouth daily.    Yes [provider]  diclofenac Sodium (VOLTAREN) 1 % GEL Apply 2 g topically daily as needed (For pain). 03/08/20  Yes [provider]  diltiazem (CARDIZEM CD) 120 MG 24 hr  capsule Take 1 capsule by mouth twice daily 08/24/20  Yes Satira Sark, MD  esomeprazole (NEXIUM) 40 MG capsule Take 80 mg by mouth 2 (two) times daily before a meal.   Yes [provider]  fexofenadine (ALLEGRA) 180 MG tablet Take 180 mg by mouth daily as needed for allergies.   Yes [provider]  fluticasone (FLONASE) 50 MCG/ACT nasal spray Place 2 sprays into both nostrils daily. Patient taking differently: Place 2 sprays into both nostrils daily as needed for allergies. 05/03/16  Yes Young, Tarri Fuller D, MD  furosemide (LASIX) 20 MG tablet Take 1 tablet (20 mg total) by mouth as needed. Patient taking differently: Take 20 mg by mouth daily as needed for fluid. 12/27/17 08/08/20 Yes Herminio Commons, MD  hydrALAZINE (APRESOLINE) 50 MG tablet Take 1.5 tablets (75 mg total) by mouth 3 (three) times daily. 01/01/20  Yes Strader, [place] M, PA-C  latanoprost (XALATAN) 0.005 % ophthalmic solution Place 1 drop into both eyes at bedtime.  09/01/12  Yes [provider]  LORazepam (ATIVAN) 1 MG tablet Take 1 tablet by mouth three times daily as needed Patient taking differently: Take 1 mg by mouth 3 (three) times daily as needed for anxiety. 07/14/20  Yes Young, Tarri Fuller D, MD  losartan (COZAAR) 100 MG tablet Take 100 mg by mouth every morning.    Yes [provider]  Multiple Vitamins-Minerals (CENTRUM SILVER PO) Take 1 tablet by mouth daily.    Yes [provider]  Polyethyl Glycol-Propyl Glycol (SYSTANE OP) Place 1 drop into both eyes daily.   Yes [provider]  polyethylene glycol (MIRALAX / GLYCOLAX) packet Take 17 g by mouth at bedtime.   Yes [provider]  potassium chloride SA (KLOR-CON) 20 MEQ tablet Take 1 tablet (20 mEq total) by mouth 2 (two) times daily. 09/01/20  Yes Verta Ellen., NP  pravastatin (PRAVACHOL) 40 MG tablet Take 1 tablet by mouth at bedtime.  10/05/14  Yes [provider]  PROAIR HFA 108 (90  Base) MCG/ACT inhaler INHALE 2 PUFFS BY MOUTH EVERY 6 HOURS AS NEEDED FOR WHEEZING FOR SHORTNESS OF BREATH Patient taking differently: Inhale 1 puff into the lungs every 6 (six) hours as needed. 03/18/20  Yes Young, Tarri Fuller D, MD  sulfamethoxazole-trimethoprim (BACTRIM DS) 800-160 MG tablet Take 1 tablet by mouth 2 (two) times daily. 11/14/20  Yes [provider]  Wheat Dextrin (BENEFIBER) POWD Take 1 packet by mouth 2 (two) times daily.   Yes [provider]  Zinc 25 MG TABS Take 1 tablet by mouth daily.   Yes [provider]  zolpidem (AMBIEN) 10 MG tablet Take 1 tablet by mouth at bedtime.  01/24/16  Yes [provider]    Inpatient Medications: Scheduled Meds: . alfuzosin  10 mg Oral QHS  . Chlorhexidine Gluconate Cloth  6 each Topical Daily  . [START ON 11/23/2020]  furosemide  20 mg Oral Daily  . hydrALAZINE  75 mg Oral TID  . losartan  100 mg Oral q morning  . mirabegron ER  25 mg Oral Daily  . mometasone-formoterol  2 puff Inhalation BID  . potassium chloride  40 mEq Oral Once  . pravastatin  40 mg Oral QHS  . tamsulosin  0.4 mg Oral Daily   Continuous Infusions: . sodium chloride 75 mL/hr at 11/22/20 0651  . heparin 1,550 Units/hr (11/22/20 0651)   PRN Meds: acetaminophen **OR** acetaminophen, albuterol, guaiFENesin-dextromethorphan, iohexol, LORazepam, ondansetron **OR** ondansetron (ZOFRAN) IV, oxyCODONE, zolpidem  Allergies:    Allergies  Allergen Reactions  . Prednisone     Pt is high functioning and out of sorts.  . Codeine Other (See Comments)    REACTION: groggy  . Tape Other (See Comments)    Skin tears, use paper tape    Social History:   Social History   Socioeconomic History  . Marital status: Married    Spouse name: Not on file  . Number of children: Not on file  . Years of education: Not on file  . Highest education level: Not on file  Occupational History  . Not on file  Tobacco Use  . Smoking status: Former Smoker     Packs/day: 1.50    Years: 30.00    Pack years: 45.00    Types: Cigarettes    Start date: 04/24/1957    Quit date: 10/15/1986    Years since quitting: 34.1  . Smokeless tobacco: Never Used  Substance and Sexual Activity  . Alcohol use: No    Alcohol/week: 0.0 standard drinks    Comment: Used to drink heavily at times  . Drug use: No  . Sexual activity: Not on file  Other Topics Concern  . Not on file  Social History Narrative   Co-dependent relationship with his 50 year old son who has drug and financial problems   Recently married to [place] on 01/02/2014   Social Determinants of Health   Financial Resource Strain: Not on file  Food Insecurity: Not on file  Transportation Needs: Not on file  Physical Activity: Not on file  Stress: Not on file  Social Connections: Not on file  Intimate Partner Violence: Not on file    Family History:     Family History  Problem Relation Age of Onset  . Cancer Father        oral cancer  . Breast cancer Mother   . Heart attack Mother   . Hypertension Sister        Bypass x4  . Alcohol abuse Sister   . Alzheimer's disease Sister   . Kidney disease Sister   . Colon cancer Neg Hx      ROS:  Please see the history of present illness.   All other ROS reviewed and negative.     Physical Exam/Data:   Vitals:   11/22/20 0300 11/22/20 0400 11/22/20 0500 11/22/20 0600  BP: (!) 91/34 (!) 121/53 (!) 96/27 (!) 100/55  Pulse: (!) 54 64 (!) 57 (!) 44  Resp: (!) 22 17 (!) 25 (!) 23  Temp:   97.8 F (36.6 C)   TempSrc:   Oral   SpO2: 93% 95% 92% 98%  Weight:      Height:        Intake/Output Summary (Last 24 hours) at 11/22/2020 1020 Last data filed at 11/22/2020 0651 Gross per 24 hour  Intake 1899.65 ml  Output 900  ml  Net 999.65 ml   Last 3 Weights 11/21/2020 11/21/2020 10/19/2020  Weight (lbs) 242 lb 8.1 oz 247 lb 247 lb 9.6 oz  Weight (kg) 110 kg 112.038 kg 112.311 kg     Body mass index is 35.81 kg/m.  General:  Well nourished, well  developed, in no acute distress HEENT: normal Lymph: no adenopathy Neck: no JVD Endocrine:  No thryomegaly Vascular: No carotid bruits; FA pulses 2+ bilaterally without bruits  Cardiac: irreg, no m/r/g, no jvd Lungs:  clear to auscultation bilaterally, no wheezing, rhonchi or rales  Abd: soft, nontender, no hepatomegaly  Ext: no edema Musculoskeletal:  No deformities, BUE and BLE strength normal and equal Skin: warm and dry  Neuro:  CNs 2-12 intact, no focal abnormalities noted Psych:  Normal affect   EKG:  The EKG was personally reviewed and demonstrates:  Afib with PVC's 75/m Telemetry:  Telemetry was personally reviewed and demonstrates:  Afib rates up to 50-60's since diltiazem held  Relevant CV Studies:  IMPRESSIONS     1. Left ventricular ejection fraction, by estimation, is 60 to 65%. The  left ventricle has normal function. The left ventricle has no regional  wall motion abnormalities. There is moderate left ventricular hypertrophy.  Left ventricular diastolic  parameters are indeterminate.   2. Right ventricular systolic function is normal. The right ventricular  size is normal. Tricuspid regurgitation signal is inadequate for assessing  PA pressure.   3. Left atrial size was mildly dilated.   4. The mitral valve is grossly normal. Trivial mitral valve  regurgitation. Moderate mitral annular calcification.   5. The aortic valve is tricuspid. There is moderate calcification of the  aortic valve. Aortic valve regurgitation is not visualized. Mild aortic  valve stenosis. Aortic valve mean gradient measures 9.0 mmHg.   Comparison(s): Echcardiogram done 12/26/17 showed an EF of 60-65% with mild  AS.    Laboratory Data:  High Sensitivity Troponin:   Recent Labs  Lab 11/21/20 1247 11/21/20 1412  TROPONINIHS 5 5     Chemistry Recent Labs  Lab 11/21/20 1247 11/21/20 2110 11/22/20 0546  NA 111* 115* 116*  K 4.0  --  3.6  CL 79*  --  85*  CO2 22  --  23   GLUCOSE 100*  --  99  BUN 17  --  14  CREATININE 1.01  --  0.82  CALCIUM 8.3*  --  8.0*  GFRNONAA >60  --  >60  ANIONGAP 10  --  8    Recent Labs  Lab 11/21/20 1247 11/22/20 0546  PROT 6.5 5.5*  ALBUMIN 3.0* 2.6*  AST 33 37  ALT 69* 76*  ALKPHOS 62 54  BILITOT 0.7 0.6   Hematology Recent Labs  Lab 11/21/20 1247 11/22/20 0546  WBC 6.4 6.0  RBC 4.11* 3.80*  HGB 12.6* 11.6*  HCT 35.7* 33.4*  MCV 86.9 87.9  MCH 30.7 30.5  MCHC 35.3 34.7  RDW 13.1 13.2  PLT 426* 434*   BNPNo results for input(s): BNP, PROBNP in the last 168 hours.  DDimer No results for input(s): DDIMER in the last 168 hours.   Radiology/Studies:  DG Pelvis 1-2 Views  Result Date: 11/21/2020 CLINICAL DATA:  Pelvic tenderness EXAM: PELVIS - 1-2 VIEW COMPARISON:  None. FINDINGS: There is no evidence of pelvic fracture or diastasis. No pelvic bone lesions are seen. Vascular calcifications noted IMPRESSION: No acute osseous abnormality. Electronically Signed   By: Suzy Bouchard M.D.   On: 11/21/2020  13:43   CT Head Wo Contrast  Result Date: 11/21/2020 CLINICAL DATA:  Being treated for UTI, increased weakness and falling. EXAM: CT HEAD WITHOUT CONTRAST CT CERVICAL SPINE WITHOUT CONTRAST TECHNIQUE: Multidetector CT imaging of the head and cervical spine was performed following the standard protocol without intravenous contrast. Multiplanar CT image reconstructions of the cervical spine were also generated. COMPARISON:  CT head and cervical spine October 23, 2018. FINDINGS: CT HEAD FINDINGS Brain: No evidence of acute infarction, hemorrhage, hydrocephalus, extra-axial collection or mass lesion/mass effect. Similar global parenchymal volume loss with ex vacuo dilatation of ventricular system. Mild burden of chronic small vessel white matter disease. Vascular: No hyperdense vessel. Vascular calcifications. 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 Skull: Normal. Negative for fracture or focal  lesion. Sinuses/Orbits: No acute finding. Other: None CT CERVICAL SPINE FINDINGS Alignment: Unchanged moderate kyphosis at C5. 3 mm anterolisthesis of C3 on C4, unchanged. Skull base and vertebrae: Negative for fracture Soft tissues and spinal canal: No prevertebral fluid or swelling. No visible canal hematoma. Atherosclerotic calcifications of the carotid arteries bilaterally. Disc levels: Multilevel disc and facet degeneration. Similar cervical spondylosis and spinal stenosis at C4-C5, C5-C6 and C6-C7. Upper chest: Negative Other: None IMPRESSION: 1. No acute intracranial pathology. Similar atrophy and chronic microvascular ischemic changes noted. 2. No acute fracture or static subluxation of the cervical spine. Similar cervical spondylosis as above. Electronically Signed   By: Dahlia Bailiff MD   On: 11/21/2020 17:48   CT Cervical Spine Wo Contrast  Result Date: 11/21/2020 CLINICAL DATA:  Being treated for UTI, increased weakness and falling. EXAM: CT HEAD WITHOUT CONTRAST CT CERVICAL SPINE WITHOUT CONTRAST TECHNIQUE: Multidetector CT imaging of the head and cervical spine was performed following the standard protocol without intravenous contrast. Multiplanar CT image reconstructions of the cervical spine were also generated. COMPARISON:  CT head and cervical spine October 23, 2018. FINDINGS: CT HEAD FINDINGS Brain: No evidence of acute infarction, hemorrhage, hydrocephalus, extra-axial collection or mass lesion/mass effect. Similar global parenchymal volume loss with ex vacuo dilatation of ventricular system. Mild burden of chronic small vessel white matter disease. Vascular: No hyperdense vessel. Vascular calcifications. 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 Skull: Normal. Negative for fracture or focal lesion. Sinuses/Orbits: No acute finding. Other: None CT CERVICAL SPINE FINDINGS Alignment: Unchanged moderate kyphosis at C5. 3 mm anterolisthesis of C3 on C4, unchanged. Skull base and  vertebrae: Negative for fracture Soft tissues and spinal canal: No prevertebral fluid or swelling. No visible canal hematoma. Atherosclerotic calcifications of the carotid arteries bilaterally. Disc levels: Multilevel disc and facet degeneration. Similar cervical spondylosis and spinal stenosis at C4-C5, C5-C6 and C6-C7. Upper chest: Negative Other: None IMPRESSION: 1. No acute intracranial pathology. Similar atrophy and chronic microvascular ischemic changes noted. 2. No acute fracture or static subluxation of the cervical spine. Similar cervical spondylosis as above. Electronically Signed   By: Dahlia Bailiff MD   On: 11/21/2020 17:48   CT CHEST ABDOMEN PELVIS W CONTRAST  Result Date: 11/21/2020 CLINICAL DATA:  86 year old male with multiple falls and abdominal trauma. EXAM: CT CHEST, ABDOMEN, AND PELVIS WITH CONTRAST TECHNIQUE: Multidetector CT imaging of the chest, abdomen and pelvis was performed following the standard protocol during bolus administration of intravenous contrast. CONTRAST:  143mL OMNIPAQUE IOHEXOL 300 MG/ML  SOLN COMPARISON:  CT abdomen pelvis dated 10/05/2020. FINDINGS: CT CHEST FINDINGS Cardiovascular: Top-normal cardiac size. No pericardial effusion. Three-vessel coronary vascular calcification. Advanced atherosclerotic calcification of the thoracic aorta. No aneurysmal dilatation  or dissection. The origins of the great vessels of the aortic arch appear patent. The central pulmonary arteries are grossly unremarkable. Mediastinum/Nodes: No hilar or mediastinal adenopathy. The esophagus and the thyroid gland are grossly unremarkable. No mediastinal fluid collection. Lungs/Pleura: Bilateral calcified pleural plaques, likely sequela of prior asbestos exposure. No focal consolidation, pleural effusion, or pneumothorax. The central airways are patent. Musculoskeletal: Osteopenia with degenerative changes of the spine. Several nondisplaced left anterior rib fractures involving the third, fourth,  fifth and, likely sixth ribs. CT ABDOMEN PELVIS FINDINGS No intra-abdominal free air or free fluid. Hepatobiliary: Subcentimeter left hepatic hypodense lesion is too small to characterize. The liver is otherwise unremarkable. No intrahepatic biliary ductal dilatation. Cholecystectomy. Pancreas: Unremarkable. No pancreatic ductal dilatation or surrounding inflammatory changes. Spleen: Normal in size without focal abnormality. Adrenals/Urinary Tract: The adrenal glands unremarkable. There is no hydronephrosis on either side. There is symmetric enhancement and excretion of contrast by both kidneys. The visualized ureters and urinary bladder appear unremarkable. Stomach/Bowel: Small scattered sigmoid diverticula without active inflammatory changes. There is no bowel obstruction or active inflammation. The appendix is normal. Vascular/Lymphatic: Advanced aortoiliac atherosclerotic disease. The IVC is unremarkable. No portal venous gas. There is no adenopathy. Reproductive: The prostate and seminal vesicles are grossly unremarkable. No pelvic masses Other: Small fat containing bilateral inguinal hernias. No fluid collection. Musculoskeletal: Osteopenia with degenerative changes of the spine. No acute osseous pathology. IMPRESSION: 1. Several nondisplaced left anterior rib fractures. No pneumothorax. 2. No acute/traumatic intra-abdominal or pelvic pathology. 3. Aortic Atherosclerosis (ICD10-I70.0). Electronically Signed   By: Anner Crete M.D.   On: 11/21/2020 17:47   DG Chest Portable 1 View  Result Date: 11/21/2020 CLINICAL DATA:  Multiple falls. EXAM: PORTABLE CHEST 1 VIEW COMPARISON:  Chest x-ray 06/24/2020 FINDINGS: Mild cardiac enlargement. Moderate tortuosity and calcification of the thoracic aorta. 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 There is moderate eventration of the right hemidiaphragm. No infiltrates, edema or effusions. No pneumothorax. No acute  bony findings.  No obvious acute rib fractures. IMPRESSION: 1. Mild cardiac enlargement. 2. 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 3. No acute cardiopulmonary findings. 4. Grossly intact bony thorax. Electronically Signed   By: Marijo Sanes M.D.   On: 11/21/2020 13:38     Assessment and Plan:   Afib with slow VR in setting of hyponatremia CHADSVASC=4 on heparin per pharmacy. Can transition to Eliquis. Patient at risk for falls with hyponatremia but hopefully will resolve  Hyponatremia with falls/presyncope/confusion Na 111 yest up to 116 today-was on Bactrim for UTI-severe N/V-per Dr. Moshe Cipro  HTN on losartan 100 mg daily, hydralazine 75 mg tid  History of PSVT  Mild AS with normal LVEF 06/2020  Coronary calcification noted on prior CT scans   Risk Assessment/Risk Scores:          CHA2DS2-VASc Score = 4  This indicates a 4.8% annual risk of stroke. The patient's score is based upon: CHF History: No HTN History: Yes Diabetes History: No Stroke History: No Vascular Disease History: Yes Age Score: 2 Gender Score: 0     [facility] will follow     For questions or updates, please contact [place] Please consult [email or web address] for contact info under    Signed, Carlyle Dolly, MD  11/22/2020 10:20 AM   Attending note  Patient seen and discussed with [place], I agree with her documentation. 86 yo male history of HTN, HL, PSVT, mild aortic stenosis, coronary calcifications, admitted with  genrealized weakness and falls. Reports recent issues with fever, urinary retention, constipation. Treated recently for UTI. Multiple recent falls at home, AMS.  On admission found to have Na of 111.     WBC 6.4 Hgb 12.6 Plt 426 Na 111 Cl 79 Cr 1.01 BUN 17 TSH 0.211 Trop 5-->5 COVID neg EKG afib CXR pleural disease, no acute process CT head: no acute process   Patient admitted with severe symptomatic  hyponatremia, management per primary team.   From cardiac standpoint new diagnosis of afib. Had some low rates in ER, his home diltiazem (has been on for PSVT) has been held. Rates 50s to 60s this AM, continue to monitor, may start back short acting dilt over next few days pending rates, had been on long acting 120mg  bid at home.   With multiple falls prior to admission (to the degree he has multiple rib fractures) would be reluctant to initiate long term oral anticoagulation. Perhaps his gait will stabilize as his sodium improves and could reassess longterm anticoag over time. Would not plan to initiate during this admission.    Carlyle Dolly MD

## 2020-11-22 NOTE — Progress Notes (Signed)
Critical sodium called to previous shift of 116 which is an increase from 115 previous lab.

## 2020-11-22 NOTE — Progress Notes (Signed)
*  PRELIMINARY RESULTS* Echocardiogram 2D Echocardiogram has been performed.  Julian Fowler 11/22/2020, 9:35 AM

## 2020-11-23 DIAGNOSIS — E871 Hypo-osmolality and hyponatremia: Secondary | ICD-10-CM | POA: Diagnosis not present

## 2020-11-23 LAB — HEPARIN LEVEL (UNFRACTIONATED): Heparin Unfractionated: 0.56 IU/mL (ref 0.30–0.70)

## 2020-11-23 LAB — BASIC METABOLIC PANEL
Anion gap: 6 (ref 5–15)
BUN: 21 mg/dL (ref 8–23)
CO2: 23 mmol/L (ref 22–32)
Calcium: 8.1 mg/dL — ABNORMAL LOW (ref 8.9–10.3)
Chloride: 88 mmol/L — ABNORMAL LOW (ref 98–111)
Creatinine, Ser: 0.87 mg/dL (ref 0.61–1.24)
GFR, Estimated: >60 mL/min (ref >60)
Glucose, Bld: 103 mg/dL — ABNORMAL HIGH (ref 70–99)
Potassium: 4.2 mmol/L (ref 3.5–5.1)
Sodium: 117 mmol/L — CL (ref 135–145)

## 2020-11-23 LAB — CBC
HCT: 33.2 % — ABNORMAL LOW (ref 39.0–52.0)
Hemoglobin: 11.6 g/dL — ABNORMAL LOW (ref 13.0–17.0)
MCH: 30.9 pg (ref 26.0–34.0)
MCHC: 34.9 g/dL (ref 30.0–36.0)
MCV: 88.3 fL (ref 80.0–100.0)
Platelets: 443 10*3/uL — ABNORMAL HIGH (ref 150–400)
RBC: 3.76 MIL/uL — ABNORMAL LOW (ref 4.22–5.81)
RDW: 13.3 % (ref 11.5–15.5)
WBC: 6.8 10*3/uL (ref 4.0–10.5)
nRBC: 0 % (ref 0.0–0.2)

## 2020-11-23 LAB — OSMOLALITY: Osmolality: 311 mOsm/kg — ABNORMAL HIGH (ref 275–295)

## 2020-11-23 LAB — SODIUM
Sodium: 117 mmol/L — CL (ref 135–145)
Sodium: 119 mmol/L — CL (ref 135–145)

## 2020-11-23 LAB — T3: T3, Total: 59 ng/dL — ABNORMAL LOW (ref 71–180)

## 2020-11-23 LAB — SODIUM, URINE, RANDOM: Sodium, Ur: <10 mmol/L

## 2020-11-23 LAB — OSMOLALITY, URINE: Osmolality, Ur: 331 mOsm/kg (ref 300–900)

## 2020-11-23 MED ORDER — SODIUM CHLORIDE 0.9 % IV SOLN: INTRAVENOUS | Status: DC

## 2020-11-23 MED ORDER — FUROSEMIDE 20 MG PO TABS
20.0000 mg | ORAL_TABLET | Freq: Every day | ORAL | Status: DC
  Administered 2020-11-24 – 2020-11-28 (×5): 20 mg via ORAL
  Filled 2020-11-23 (×5): qty 1

## 2020-11-23 NOTE — Progress Notes (Signed)
Physical Therapy Treatment Patient Details Name: Julian Fowler MRN: [id number] DOB: 12/05/1934 Today's Date: 11/23/2020    History of Present Illness Julian Fowler  is a 86 y.o. male, with history of obstructive sleep apnea, paroxysmal supraventricular tachycardia, hyperlipidemia, chronic lung disease, GERD, hypertension, BPH, and more presents to the ED with a chief complaint of weakness and falls.  Wife reports that symptoms generally started 10 days ago.  She said he had fever and chills, and then 9 days ago he started have urinary retention and constipation.  Urinary retention was followed by urinary frequency with malodorous urine and dark-colored urine.  7 days ago they went to the PCP and Bactrim was prescribed.  Patient then developed nausea and vomiting which they attributed to Bactrim.  The same day that he went to the PCP he fell in the bathroom and hit the toilet on the way down hurting his left chest.  PCP called him the next day and said that his sodium was mildly low at 131 and he was supposed to get it redrawn today.  3 days ago patient had an acute change with increased generalized weakness decrease in appetite continued nausea and vomiting and another fall that was presyncopal.  Patient is usually able to ambulate with his walker, but wife has been having to hold his walker for him because he has been so weak.  He reports that for the last 2 days he has not been able to walk at all.  3 days ago when he had that acute change they called EMS.  EMS advised that hospitals have long wait times right now because of COVID, so they decided not to get transported.  Patient then became confused today and they decided to come into the ER.    PT Comments    Patient demonstrates increased endurance/distance for gait training with slightly labored cadence, occasional verbal cues to slow speed of cadence to avoid loss of balance and limited mostly due to SOB with SpO2 dropping from 96% to 87% with  exertion.  Patient continued staying up in chair after therapy with his spouse present in room - RN aware.  Patient will benefit from continued physical therapy in hospital and recommended venue below to increase strength, balance, endurance for safe ADLs and gait.   Follow Up Recommendations  SNF;Supervision for mobility/OOB;Supervision - Intermittent     Equipment Recommendations  None recommended by PT    Recommendations for Other Services       Precautions / Restrictions Precautions Precautions: Fall Restrictions Weight Bearing Restrictions: No    Mobility  Bed Mobility               General bed mobility comments: Patient presents up in chair (assisted by nursing staff)  Transfers Overall transfer level: Needs assistance Equipment used: Rolling walker (2 wheeled) Transfers: Sit to/from [facility] Sit to Stand: Min guard;Min assist Stand pivot transfers: Min guard       General transfer comment: occasional verbal cues for proper hand placement during sit to stands with fair/good carryover  Ambulation/Gait Ambulation/Gait assistance: Min guard Gait Distance (Feet): 85 Feet Assistive device: Rolling walker (2 wheeled) Gait Pattern/deviations: Decreased step length - right;Decreased step length - left;Decreased stride length;Trunk flexed Gait velocity: near normal   General Gait Details: slightly labored cadence with occaional verbal cues for safety due to walking to fast, no loss of balance, limited secondary to fatigue   Stairs  Wheelchair Mobility    Modified Rankin (Stroke Patients Only)       Balance Overall balance assessment: Needs assistance Sitting-balance support: Feet supported;No upper extremity supported Sitting balance-Leahy Scale: Good Sitting balance - Comments: seated at EOB   Standing balance support: During functional activity;Bilateral upper extremity supported Standing balance-Leahy Scale:  Fair Standing balance comment: using RW                            Cognition Arousal/Alertness: Awake/alert Behavior During Therapy: WFL for tasks assessed/performed Overall Cognitive Status: Within Functional Limits for tasks assessed                                        Exercises General Exercises - Lower Extremity Long Arc Quad: Seated;AROM;Strengthening;Both;10 reps Hip Flexion/Marching: Seated;AROM;Strengthening;Both;10 reps Toe Raises: Seated;AROM;Strengthening;10 reps;Both Heel Raises: Seated;AROM;Strengthening;10 reps;Both    General Comments        Pertinent Vitals/Pain Pain Assessment: No/denies pain    Home Living                      Prior Function            PT Goals (current goals can now be found in the care plan section) Acute Rehab PT Goals Patient Stated Goal: return home with family to assist PT Goal Formulation: With patient/family Time For Goal Achievement: 12/06/20 Potential to Achieve Goals: Good Progress towards PT goals: Progressing toward goals    Frequency    Min 3X/week      PT Plan Current plan remains appropriate    Co-evaluation              AM-PAC PT "6 Clicks" Mobility   Outcome Measure  Help needed turning from your back to your side while in a flat bed without using bedrails?: A Little Help needed moving from lying on your back to sitting on the side of a flat bed without using bedrails?: A Little Help needed moving to and from a bed to a chair (including a wheelchair)?: A Little Help needed standing up from a chair using your arms (e.g., wheelchair or bedside chair)?: A Little Help needed to walk in hospital room?: A Little Help needed climbing 3-5 steps with a railing? : A Lot 6 Click Score: 17    End of Session   Activity Tolerance: Patient tolerated treatment well;Patient limited by fatigue Patient left: in chair;with call bell/phone within reach;with family/visitor  present Nurse Communication: Mobility status PT Visit Diagnosis: Unsteadiness on feet (R26.81);Other abnormalities of gait and mobility (R26.89);Muscle weakness (generalized) (M62.81)     Time: 9767-3419 PT Time Calculation (min) (ACUTE ONLY): 31 min  Charges:  $Gait Training: 8-22 mins $Therapeutic Exercise: 8-22 mins                     3:06 PM, 11/23/20 Lonell Grandchild, MPT Physical Therapist with [facility] [phone] office [phone] mobile phone

## 2020-11-23 NOTE — Progress Notes (Signed)
ANTICOAGULATION CONSULT NOTE -   Pharmacy Consult for heparin gtt  Indication: atrial fibrillation  Allergies  Allergen Reactions  . Prednisone     Pt is high functioning and out of sorts.  . Codeine Other (See Comments)    REACTION: groggy  . Tape Other (See Comments)    Skin tears, use paper tape    Patient Measurements: Height: 5\' 9"  (175.3 cm) Weight: 110 kg (242 lb 8.1 oz) IBW/kg (Calculated) : 70.7 HEPARIN DW (KG): 95.5   Vital Signs: Temp: 97.9 F (36.6 C) (02/09 0400) Temp Source: Oral (02/09 0400) BP: 124/24 (02/09 0700) Pulse Rate: 73 (02/09 0700)  Labs: Recent Labs    11/21/20 1247 11/22/20 0546 11/22/20 1636 11/23/20 0532  HGB 12.6* 11.6*  --  11.6*  HCT 35.7* 33.4*  --  33.2*  PLT 426* 434*  --  443*  HEPARINUNFRC  --  0.62 0.58 0.56  CREATININE 1.01 0.82  --  0.87    Estimated Creatinine Clearance: 78.6 mL/min (by C-G formula based on SCr of 0.87 mg/dL).   Medical History: Past Medical History:  Diagnosis Date  . Allergic rhinitis   . Aortic stenosis   . Arthritis   . BPH (benign prostatic hyperplasia)   . Coronary artery calcification seen on CT scan   . Easy bruisability   . ED (erectile dysfunction)   . Essential hypertension   . GERD (gastroesophageal reflux disease)   . H/O hiatal hernia   . Hearing loss in left ear   . History of asbestosis   . History of shingles   . Hyperlipidemia   . Overactive bladder   . PSVT (paroxysmal supraventricular tachycardia) ([place])   . Sleep apnea    No longer on CPAP following weight loss  . Tinnitus     Assessment: Julian Fowler a 86 y.o. male requires anticoagulation with a heparin iv infusion for the indication of  atrial fibrillation. Heparin gtt  started following pharmacy protocol per pharmacy consult. Patient was not on previous oral anti-coagulation.  11/23/20 0900 update Heparin level:  0.56 IU/mL, within therapeutic goal range on heparin at 1550 units/hr CBC:  Hb 11.6   Plates:  443K RN reports no issues with bleeding or infusion site problems   Goal of Therapy:  Heparin level 0.3-0.7 units/ml Monitor platelets by anticoagulation protocol: Yes   Plan:  Continue  heparin infusion at 1550 units/hr Daily heparin level and CBC while on heparin Monitor for signs and symptoms of bleeding  Despina Pole, Pharm. D. Clinical Pharmacist 11/23/2020 8:50 AM

## 2020-11-23 NOTE — Progress Notes (Signed)
CRITICAL VALUE ALERT  Critical Value:  Sodium 117  Date & Time Notied:  11/23/20 @ 7622.  Provider Notified: Manuella Ghazi, MD.  Orders Received/Actions taken: Restart NS @ 100 ml/hr and Lasix PO.

## 2020-11-23 NOTE — Progress Notes (Signed)
Patient ID: Julian Fowler, male   DOB: July 05, 1935, 86 y.o.   MRN: [id number] S: "I'm not having a good morning".  Reports that he did not sleep well last night.  More awake but still little confused. O:BP (!) 141/67   Pulse 95   Temp 97.9 F (36.6 C) (Oral)   Resp (!) 22   Ht 5\' 9"  (1.753 m)   Wt 110 kg   SpO2 97%   BMI 35.81 kg/m   Intake/Output Summary (Last 24 hours) at 11/23/2020 1026 Last data filed at 11/23/2020 2836 Gross per 24 hour  Intake 2927.9 ml  Output 1500 ml  Net 1427.9 ml   Intake/Output: I/O last 3 completed shifts: In: 4067.6 [P.O.:840; I.V.:3227.6] Out: 2400 [Urine:2400]  Intake/Output this shift:  No intake/output data recorded. Weight change:  Gen: fatigued, no apparent distress CVS: RRR, no rubs Resp: occ rhonchi bibasilar Abd: +BS, soft, NT/ND Ext: no edema  Recent Labs  Lab 11/21/20 1247 11/21/20 2110 11/22/20 0546 11/22/20 1658 11/23/20 0532  NA 111* 115* 116* 116* 117*  K 4.0  --  3.6  --  4.2  CL 79*  --  85*  --  88*  CO2 22  --  23  --  23  GLUCOSE 100*  --  99  --  103*  BUN 17  --  14  --  21  CREATININE 1.01  --  0.82  --  0.87  ALBUMIN 3.0*  --  2.6*  --   --   CALCIUM 8.3*  --  8.0*  --  8.1*  AST 33  --  37  --   --   ALT 69*  --  76*  --   --    Liver Function Tests: Recent Labs  Lab 11/21/20 1247 11/22/20 0546  AST 33 37  ALT 69* 76*  ALKPHOS 62 54  BILITOT 0.7 0.6  PROT 6.5 5.5*  ALBUMIN 3.0* 2.6*   Recent Labs  Lab 11/21/20 1247  LIPASE 50   No results for input(s): AMMONIA in the last 168 hours. CBC: Recent Labs  Lab 11/21/20 1247 11/22/20 0546 11/23/20 0532  WBC 6.4 6.0 6.8  NEUTROABS 4.5  --   --   HGB 12.6* 11.6* 11.6*  HCT 35.7* 33.4* 33.2*  MCV 86.9 87.9 88.3  PLT 426* 434* 443*   Cardiac Enzymes: No results for input(s): CKTOTAL, CKMB, CKMBINDEX, TROPONINI in the last 168 hours. CBG: No results for input(s): GLUCAP in the last 168 hours.  Iron Studies: No results for input(s): IRON,  TIBC, TRANSFERRIN, FERRITIN in the last 72 hours. Studies/Results: DG Pelvis 1-2 Views  Result Date: 11/21/2020 CLINICAL DATA:  Pelvic tenderness EXAM: PELVIS - 1-2 VIEW COMPARISON:  None. FINDINGS: There is no evidence of pelvic fracture or diastasis. No pelvic bone lesions are seen. Vascular calcifications noted IMPRESSION: No acute osseous abnormality. Electronically Signed   By: Suzy Bouchard M.D.   On: 11/21/2020 13:43   CT Head Wo Contrast  Result Date: 11/21/2020 CLINICAL DATA:  Being treated for UTI, increased weakness and falling. EXAM: CT HEAD WITHOUT CONTRAST CT CERVICAL SPINE WITHOUT CONTRAST TECHNIQUE: Multidetector CT imaging of the head and cervical spine was performed following the standard protocol without intravenous contrast. Multiplanar CT image reconstructions of the cervical spine were also generated. COMPARISON:  CT head and cervical spine October 23, 2018. FINDINGS: CT HEAD FINDINGS Brain: No evidence of acute infarction, hemorrhage, hydrocephalus, extra-axial collection or mass lesion/mass effect. Similar global parenchymal  volume loss with ex vacuo dilatation of ventricular system. Mild burden of chronic small vessel white matter disease. Vascular: No hyperdense vessel. Vascular calcifications. 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 Skull: Normal. Negative for fracture or focal lesion. Sinuses/Orbits: No acute finding. Other: None CT CERVICAL SPINE FINDINGS Alignment: Unchanged moderate kyphosis at C5. 3 mm anterolisthesis of C3 on C4, unchanged. Skull base and vertebrae: Negative for fracture Soft tissues and spinal canal: No prevertebral fluid or swelling. No visible canal hematoma. Atherosclerotic calcifications of the carotid arteries bilaterally. Disc levels: Multilevel disc and facet degeneration. Similar cervical spondylosis and spinal stenosis at C4-C5, C5-C6 and C6-C7. Upper chest: Negative Other: None IMPRESSION: 1. No acute intracranial pathology.  Similar atrophy and chronic microvascular ischemic changes noted. 2. No acute fracture or static subluxation of the cervical spine. Similar cervical spondylosis as above. Electronically Signed   By: Dahlia Bailiff MD   On: 11/21/2020 17:48   CT Cervical Spine Wo Contrast  Result Date: 11/21/2020 CLINICAL DATA:  Being treated for UTI, increased weakness and falling. EXAM: CT HEAD WITHOUT CONTRAST CT CERVICAL SPINE WITHOUT CONTRAST TECHNIQUE: Multidetector CT imaging of the head and cervical spine was performed following the standard protocol without intravenous contrast. Multiplanar CT image reconstructions of the cervical spine were also generated. COMPARISON:  CT head and cervical spine October 23, 2018. FINDINGS: CT HEAD FINDINGS Brain: No evidence of acute infarction, hemorrhage, hydrocephalus, extra-axial collection or mass lesion/mass effect. Similar global parenchymal volume loss with ex vacuo dilatation of ventricular system. Mild burden of chronic small vessel white matter disease. Vascular: No hyperdense vessel. Vascular calcifications. 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 Skull: Normal. Negative for fracture or focal lesion. Sinuses/Orbits: No acute finding. Other: None CT CERVICAL SPINE FINDINGS Alignment: Unchanged moderate kyphosis at C5. 3 mm anterolisthesis of C3 on C4, unchanged. Skull base and vertebrae: Negative for fracture Soft tissues and spinal canal: No prevertebral fluid or swelling. No visible canal hematoma. Atherosclerotic calcifications of the carotid arteries bilaterally. Disc levels: Multilevel disc and facet degeneration. Similar cervical spondylosis and spinal stenosis at C4-C5, C5-C6 and C6-C7. Upper chest: Negative Other: None IMPRESSION: 1. No acute intracranial pathology. Similar atrophy and chronic microvascular ischemic changes noted. 2. No acute fracture or static subluxation of the cervical spine. Similar cervical spondylosis as above. Electronically  Signed   By: Dahlia Bailiff MD   On: 11/21/2020 17:48   CT CHEST ABDOMEN PELVIS W CONTRAST  Result Date: 11/21/2020 CLINICAL DATA:  86 year old male with multiple falls and abdominal trauma. EXAM: CT CHEST, ABDOMEN, AND PELVIS WITH CONTRAST TECHNIQUE: Multidetector CT imaging of the chest, abdomen and pelvis was performed following the standard protocol during bolus administration of intravenous contrast. CONTRAST:  110mL OMNIPAQUE IOHEXOL 300 MG/ML  SOLN COMPARISON:  CT abdomen pelvis dated 10/05/2020. FINDINGS: CT CHEST FINDINGS Cardiovascular: Top-normal cardiac size. No pericardial effusion. Three-vessel coronary vascular calcification. Advanced atherosclerotic calcification of the thoracic aorta. No aneurysmal dilatation or dissection. The origins of the great vessels of the aortic arch appear patent. The central pulmonary arteries are grossly unremarkable. Mediastinum/Nodes: No hilar or mediastinal adenopathy. The esophagus and the thyroid gland are grossly unremarkable. No mediastinal fluid collection. Lungs/Pleura: Bilateral calcified pleural plaques, likely sequela of prior asbestos exposure. No focal consolidation, pleural effusion, or pneumothorax. The central airways are patent. Musculoskeletal: Osteopenia with degenerative changes of the spine. Several nondisplaced left anterior rib fractures involving the third, fourth, fifth and, likely sixth ribs. CT ABDOMEN PELVIS FINDINGS No intra-abdominal free  air or free fluid. Hepatobiliary: Subcentimeter left hepatic hypodense lesion is too small to characterize. The liver is otherwise unremarkable. No intrahepatic biliary ductal dilatation. Cholecystectomy. Pancreas: Unremarkable. No pancreatic ductal dilatation or surrounding inflammatory changes. Spleen: Normal in size without focal abnormality. Adrenals/Urinary Tract: The adrenal glands unremarkable. There is no hydronephrosis on either side. There is symmetric enhancement and excretion of contrast by  both kidneys. The visualized ureters and urinary bladder appear unremarkable. Stomach/Bowel: Small scattered sigmoid diverticula without active inflammatory changes. There is no bowel obstruction or active inflammation. The appendix is normal. Vascular/Lymphatic: Advanced aortoiliac atherosclerotic disease. The IVC is unremarkable. No portal venous gas. There is no adenopathy. Reproductive: The prostate and seminal vesicles are grossly unremarkable. No pelvic masses Other: Small fat containing bilateral inguinal hernias. No fluid collection. Musculoskeletal: Osteopenia with degenerative changes of the spine. No acute osseous pathology. IMPRESSION: 1. Several nondisplaced left anterior rib fractures. No pneumothorax. 2. No acute/traumatic intra-abdominal or pelvic pathology. 3. Aortic Atherosclerosis (ICD10-I70.0). Electronically Signed   By: Anner Crete M.D.   On: 11/21/2020 17:47   DG Chest Portable 1 View  Result Date: 11/21/2020 CLINICAL DATA:  Multiple falls. EXAM: PORTABLE CHEST 1 VIEW COMPARISON:  Chest x-ray 06/24/2020 FINDINGS: Mild cardiac enlargement. Moderate tortuosity and calcification of the thoracic aorta. 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 There is moderate eventration of the right hemidiaphragm. No infiltrates, edema or effusions. No pneumothorax. No acute bony findings.  No obvious acute rib fractures. IMPRESSION: 1. Mild cardiac enlargement. 2. 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 3. No acute cardiopulmonary findings. 4. Grossly intact bony thorax. Electronically Signed   By: Marijo Sanes M.D.   On: 11/21/2020 13:38   ECHOCARDIOGRAM COMPLETE  Result Date: 11/22/2020    ECHOCARDIOGRAM REPORT   Patient Name:   MARKEESE BOYAJIAN Date of Exam: 11/22/2020 Medical Rec #:  [id number]       Height:       69.0 in Accession #:    [id number]      Weight:       242.5 lb Date of Birth:   03/29/1935       BSA:          2.242 m Patient Age:    46 years        BP:           100/55 mmHg Patient Gender: M               HR:           44 bpm. Exam Location:  [facility] Procedure: 2D Echo Indications:    Atrial Fibrillation I48.91  History:        Patient has prior history of Echocardiogram examinations, most                 recent 09/06/2020. Arrythmias:Atrial Fibrillation,                 Signs/Symptoms:Chest Pain, Syncope and Murmur; Risk                 Factors:Hypertension, Dyslipidemia and Former Smoker.  Sonographer:    Leavy Cella RDCS (AE) Referring Phys: [id number] [facility] [place]  1. Left ventricular ejection fraction, by estimation, is 60 to 65%. The left ventricle has normal function. The left ventricle has no regional wall motion abnormalities. There is moderate left ventricular hypertrophy. Left ventricular diastolic parameters are indeterminate.  2. Right ventricular systolic function is  normal. The right ventricular size is normal.  3. The mitral valve is normal in structure. No evidence of mitral valve regurgitation. No evidence of mitral stenosis.  4. The aortic valve is tricuspid. There is moderate calcification of the aortic valve. There is moderate thickening of the aortic valve. Aortic valve regurgitation is not visualized. Mild aortic valve stenosis. Aortic valve mean gradient measures 10.3 mmHg. Aortic valve peak gradient measures 18.8 mmHg. Aortic valve area, by VTI measures 1.78 cm.  5. The inferior vena cava is normal in size with greater than 50% respiratory variability, suggesting right atrial pressure of 3 mmHg. FINDINGS  Left Ventricle: Left ventricular ejection fraction, by estimation, is 60 to 65%. The left ventricle has normal function. The left ventricle has no regional wall motion abnormalities. The left ventricular internal cavity size was normal in size. There is  moderate left ventricular hypertrophy. Left ventricular diastolic function could not be  evaluated due to atrial fibrillation. Left ventricular diastolic parameters are indeterminate. Right Ventricle: The right ventricular size is normal. No increase in right ventricular wall thickness. Right ventricular systolic function is normal. Left Atrium: Left atrial size was normal in size. Right Atrium: Right atrial size was normal in size. Pericardium: There is no evidence of pericardial effusion. Mitral Valve: The mitral valve is normal in structure. There is mild thickening of the mitral valve leaflet(s). There is mild calcification of the mitral valve leaflet(s). Mild to moderate mitral annular calcification. No evidence of mitral valve regurgitation. No evidence of mitral valve stenosis. Tricuspid Valve: The tricuspid valve is normal in structure. Tricuspid valve regurgitation is trivial. No evidence of tricuspid stenosis. Aortic Valve: The aortic valve is tricuspid. There is moderate calcification of the aortic valve. There is moderate thickening of the aortic valve. There is moderate aortic valve annular calcification. Aortic valve regurgitation is not visualized. Mild aortic stenosis is present. Aortic valve mean gradient measures 10.3 mmHg. Aortic valve peak gradient measures 18.8 mmHg. Aortic valve area, by VTI measures 1.78 cm. Pulmonic Valve: The pulmonic valve was not well visualized. Pulmonic valve regurgitation is not visualized. No evidence of pulmonic stenosis. Aorta: The aortic root is normal in size and structure. Pulmonary Artery: Indeterminant PASP, inadequate TR jet. Venous: The inferior vena cava is normal in size with greater than 50% respiratory variability, suggesting right atrial pressure of 3 mmHg. IAS/Shunts: No atrial level shunt detected by color flow Doppler.  LEFT VENTRICLE PLAX 2D LVIDd:         3.46 cm  Diastology LVIDs:         2.28 cm  LV e' medial:    0.10 cm/s LV PW:         1.40 cm  LV E/e' medial:  8.7 LV IVS:        1.40 cm  LV e' lateral:   0.10 cm/s LVOT diam:      2.00 cm  LV E/e' lateral: 9.3 LV SV:         69 LV SV Index:   31 LVOT Area:     3.14 cm  RIGHT VENTRICLE RV S prime:     10.40 cm/s LEFT ATRIUM           Index       RIGHT ATRIUM           Index LA diam:      4.90 cm 2.19 cm/m  RA Area:     19.40 cm LA Vol (A2C): 26.0 ml 11.60 ml/m RA Volume:  44.20 ml  19.71 ml/m LA Vol (A4C): 49.7 ml 22.16 ml/m  AORTIC VALVE AV Area (Vmax):    1.66 cm AV Area (Vmean):   1.83 cm AV Area (VTI):     1.78 cm AV Vmax:           217.00 cm/s AV Vmean:          149.667 cm/s AV VTI:            0.387 m AV Peak Grad:      18.8 mmHg AV Mean Grad:      10.3 mmHg LVOT Vmax:         115.00 cm/s LVOT Vmean:        87.200 cm/s LVOT VTI:          0.220 m LVOT/AV VTI ratio: 0.57  AORTA Ao Root diam: 2.90 cm MITRAL VALVE MV Area (PHT): 3.19 cm   SHUNTS MV Decel Time: 238 msec   Systemic VTI:  0.22 m MV E velocity: 0.90 cm/s  Systemic Diam: 2.00 cm Carlyle Dolly MD Electronically signed by Carlyle Dolly MD Signature Date/Time: 11/22/2020/10:59:48 AM    Final    . alfuzosin  10 mg Oral QHS  . Chlorhexidine Gluconate Cloth  6 each Topical Daily  . furosemide  20 mg Oral Daily  . hydrALAZINE  75 mg Oral TID  . losartan  100 mg Oral q morning  . mirabegron ER  25 mg Oral Daily  . mometasone-formoterol  2 puff Inhalation BID  . pravastatin  40 mg Oral QHS    BMET    Component Value Date/Time   NA 117 (LL) 11/23/2020 0532   K 4.2 11/23/2020 0532   CL 88 (L) 11/23/2020 0532   CO2 23 11/23/2020 0532   GLUCOSE 103 (H) 11/23/2020 0532   BUN 21 11/23/2020 0532   CREATININE 0.87 11/23/2020 0532   CREATININE 0.78 09/22/2020 1006   CALCIUM 8.1 (L) 11/23/2020 0532   GFRNONAA >60 11/23/2020 0532   GFRAA >60 12/31/2018 0040   CBC    Component Value Date/Time   WBC 6.8 11/23/2020 0532   RBC 3.76 (L) 11/23/2020 0532   HGB 11.6 (L) 11/23/2020 0532   HCT 33.2 (L) 11/23/2020 0532   PLT 443 (H) 11/23/2020 0532   MCV 88.3 11/23/2020 0532   MCH 30.9 11/23/2020 0532   MCHC  34.9 11/23/2020 0532   RDW 13.3 11/23/2020 0532   LYMPHSABS 0.8 11/21/2020 1247   MONOABS 0.8 11/21/2020 1247   EOSABS 0.0 11/21/2020 1247   BASOSABS 0.0 11/21/2020 1247     Assessment/Plan:  1. Acute on chronic hyponatremia- by history had nausea and anorexia the week prior to admission consistent with hypovolemia although his urine osm and urine sodium more consistent with tea and toast, not pre-renal or SIADH.  His sodium has improved from 111 to 117 this am after receiving IV NS.  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   1. Continue with IV NS for now and repeat sodium level every 4 hours (should have been drawn at 9am) 2. if sodium doesn't improve further will consider 3% saline 3. Encourage po intake 4. Furosemide 20 mg started this morning and awaiting results of sodium level.  2. New onset atrial fibrillation- started on heparin and Cardiology following.  But longterm anticoagulation an issue due to recent falls 3. Falls with rib fractures - likely due to weakness from UTI and volume depletion (had week long  nausea and anorexia) 4. HTN- stable 5. Hypokalemia- improved.   Donetta Potts, MD [employer] [phone]

## 2020-11-23 NOTE — Progress Notes (Signed)
PROGRESS NOTE    Julian Fowler  MLY:[id number] DOB: 1935-06-03 DOA: 11/21/2020 PCP: [name], MD   Brief Narrative:  86 year old male with a history of BPH, paroxysmal SVT, obstructive sleep apnea, admitted to the hospital with generalized weakness, falls.  He was found to be significantly hyponatremic.  Also found to have rib fractures related to fall.  Was also noted to have new onset atrial fibrillation.  Being seen by nephrology and cardiology.  Serum sodium level slowly improving.  A. fib is controlled.  Anticipate discharge in the next 1 to 2 days.  Assessment & Plan:   Principal Problem:   Acute hyponatremia Active Problems:   Essential hypertension   New onset a-fib (HCC)   Falls   Rib fractures   Acute on chronic hyponatremia-improving -Admitted with weakness, confusion and falls -Sodium of 111 on admission, now around 117 -Started on normal saline loop diuretic -Overall serum sodium has been trending up -Urine osmolality is low -Appreciate ongoing nephrology input -Patient reports poor p.o. 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  Will need to be readdressed as an outpatient -Will need Holter on discharge -Continues to be rate controlled -On heparin drip  Falls with rib fractures -Secondary to weakness -Seen by PT with recommendation for skilled nursing facility -Patient and family will likely elect to go home with home health  Hypertension -On losartan and hydralazine -Blood pressure stable -Now on Lasix per  Nephrology  BPH -Continue alfuzosin   DVT prophylaxis: Heparin drip Code Status: Full code Family Communication: Discussed with wife at the bedside Disposition Plan: Status is: Inpatient  Remains inpatient appropriate because:Persistent severe electrolyte disturbances   Dispo: The patient is from: Home  Anticipated d/c is to: Home  Anticipated d/c date is: 2 days  Patient currently is not medically stable to d/c.              Difficult to place patient No    Consultants:   Nephrology  Cardiology  Procedures:   See below  Antimicrobials:    None  Subjective: Patient seen and evaluated today with inability to sleep well last night. He is sitting in his feces and would like nurses to assist him. He states he feels "fine" and would like to go home if possible.  Objective: Vitals:   11/23/20 1003 11/23/20 1100 11/23/20 1200 11/23/20 1300  BP: (!) 141/67 (!) 154/59 (!) 136/59 (!) 133/53  Pulse: 95 84 74 67  Resp: (!) 22 14 15 12   Temp:      TempSrc:      SpO2: 97% 98% 99% 90%  Weight:      Height:        Intake/Output Summary (Last 24 hours) at 11/23/2020 1409 Last data filed at 11/23/2020 1138 Gross per 24 hour  Intake 2687.9 ml  Output 1700 ml  Net 987.9 ml   Filed Weights   11/21/20 1104 11/21/20 2200  Weight: 112 kg 110 kg    Examination:  General exam: Appears calm and comfortable, obese Respiratory system: Clear to auscultation. Respiratory effort normal. Cardiovascular system: S1 &  S2 heard, irregular.  Gastrointestinal system: Abdomen is soft Central nervous system: Alert and awake Extremities: No edema Skin: No significant lesions noted Psychiatry: Flat affect.    Data Reviewed: I have personally reviewed following labs and imaging studies  CBC: Recent Labs  Lab 11/21/20 1247 11/22/20 0546 11/23/20 0532  WBC 6.4 6.0 6.8  NEUTROABS 4.5  --   --   HGB 12.6* 11.6* 11.6*  HCT 35.7* 33.4*  33.2*  MCV 86.9 87.9 88.3  PLT 426* 434* 283*   Basic Metabolic Panel: Recent Labs  Lab 11/21/20 1247 11/21/20 2110 11/22/20 0546 11/22/20 1658 11/23/20 0532 11/23/20 1119  NA 111* 115* 116* 116* 117* 136  K 4.0  --  3.6  --  4.2  --   CL 79*  --  85*  --  88*  --   CO2 22  --  23  --  23  --   GLUCOSE 100*  --  99  --  103*  --   BUN 17  --  14  --  21  --   CREATININE 1.01  --  0.82  --  0.87  --   CALCIUM 8.3*  --  8.0*  --  8.1*  --   MG  --  1.7  --   --   --   --    GFR: Estimated Creatinine Clearance: 78.6 mL/min (by C-G formula based on SCr of 0.87 mg/dL). Liver Function Tests: Recent Labs  Lab 11/21/20 1247 11/22/20 0546  AST 33 37  ALT 69* 76*  ALKPHOS 62 54  BILITOT 0.7 0.6  PROT 6.5 5.5*  ALBUMIN 3.0* 2.6*   Recent Labs  Lab 11/21/20 1247  LIPASE 50   No results for input(s): AMMONIA in the last 168 hours. Coagulation Profile: No results for input(s): INR, PROTIME in the last 168 hours. Cardiac Enzymes: No results for input(s): CKTOTAL, CKMB, CKMBINDEX, TROPONINI in the last 168 hours. BNP (last 3 results) No results for input(s): PROBNP in the last 8760 hours. HbA1C: No results for input(s): HGBA1C in the last 72 hours. CBG: No results for input(s): GLUCAP in the last 168 hours. Lipid Profile: No results for input(s): CHOL, HDL, LDLCALC, TRIG, CHOLHDL, LDLDIRECT in the last 72 hours. Thyroid Function Tests: Recent Labs    11/22/20 0546 11/22/20 0916  TSH 0.238*  --   FREET4  --  1.51*   Anemia Panel: No results for input(s): VITAMINB12, FOLATE, FERRITIN, TIBC, IRON, RETICCTPCT in the last 72 hours. Sepsis Labs: Recent Labs  Lab 11/21/20 1248  LATICACIDVEN 0.6    Recent Results (from the past 240 hour(s))  Urine culture     Status: Abnormal   Collection Time: 11/21/20 12:30 PM   Specimen: Urine, Random  Result Value Ref Range Status   Specimen Description   Final    URINE, RANDOM Performed at [facility], [address],  [place], [place] [address]    Special Requests   Final    NONE Performed at [facility], [address] [place], [place] [address]    Culture (A)  Final    <10,000 COLONIES/mL INSIGNIFICANT GROWTH Performed at [facility] [address] [place], [place] [address]    Report Status 11/22/2020 FINAL  Final  Blood culture (routine x 2)     Status: None (Preliminary result)   Collection Time: 11/21/20 12:46 PM   Specimen: BLOOD  Result Value Ref Range Status   Specimen Description BLOOD RIGHT ANTECUBITAL  Final   Special Requests   Final    BOTTLES DRAWN AEROBIC AND ANAEROBIC Blood Culture adequate volume   Culture   Final    NO GROWTH 2 DAYS Performed at [facility], [address] [place], [place] [address]    Report Status PENDING  Incomplete  Blood culture (routine x 2)     Status: None (Preliminary result)   Collection Time: 11/21/20 12:54 PM   Specimen: BLOOD  Result Value Ref Range Status   Specimen Description BLOOD LEFT ANTECUBITAL  Final   Special Requests   Final    BOTTLES DRAWN AEROBIC AND ANAEROBIC Blood Culture adequate volume   Culture   Final    NO GROWTH 2 DAYS Performed at [facility], [address] [place], [place] [address]    Report Status PENDING  Incomplete  SARS Coronavirus 2 by RT PCR (hospital order, performed in [facility] hospital lab) Nasopharyngeal Nasopharyngeal Swab     Status: None   Collection Time: 11/21/20  1:54 PM   Specimen: Nasopharyngeal Swab  Result Value Ref Range Status   SARS Coronavirus 2 NEGATIVE NEGATIVE Final    Comment: (NOTE) SARS-CoV-2 target nucleic acids are NOT DETECTED.  The SARS-CoV-2 RNA is generally detectable in upper and lower respiratory specimens during the acute phase of infection. The lowest concentration of SARS-CoV-2 viral copies this assay can detect is 250 copies / mL. A negative result does not preclude SARS-CoV-2 infection and should not be used as the sole basis for treatment or other patient  management decisions.  A negative result may occur with improper specimen collection / handling, submission of specimen other than nasopharyngeal swab, presence of viral mutation(s) within the areas targeted by this assay, and inadequate number of viral copies (<250 copies / mL). A negative result must be combined with clinical observations, patient history, and epidemiological information.  Fact Sheet for Patients:   StrictlyIdeas.no  Fact Sheet for Healthcare Providers: BankingDealers.co.za  This test is not yet approved or  cleared by the [place] FDA and has been authorized for detection and/or diagnosis of SARS-CoV-2 by FDA under an Emergency Use Authorization (EUA).  This EUA will remain in effect (meaning this test can be used) for the duration of the COVID-19 declaration under Section 564(b)(1) of the Act, 21 U.S.C. section 360bbb-3(b)(1), unless the authorization is terminated or revoked sooner.  Performed at [facility], [address], [place], [place] [address]   MRSA PCR Screening     Status: None   Collection Time: 11/21/20 10:08 PM   Specimen: Nasal Mucosa; Nasopharyngeal  Result Value Ref Range Status   MRSA by PCR NEGATIVE NEGATIVE Final    Comment:        The GeneXpert MRSA Assay (FDA approved for NASAL specimens only), is one component of a comprehensive MRSA colonization surveillance program. It is not intended to diagnose MRSA infection nor to guide or monitor treatment for MRSA infections. Performed at [facility], [address], [place], [place] [address]          Radiology Studies: CT Head Wo Contrast  Result Date: 11/21/2020 CLINICAL DATA:  Being treated for UTI, increased weakness and falling. EXAM: CT HEAD WITHOUT CONTRAST CT CERVICAL SPINE WITHOUT CONTRAST TECHNIQUE: Multidetector CT imaging of the head and cervical spine was performed following the standard protocol without intravenous  contrast. Multiplanar CT image reconstructions of the cervical spine were also generated. COMPARISON:  CT head and cervical spine October 23, 2018. FINDINGS: CT HEAD FINDINGS  Brain: No evidence of acute infarction, hemorrhage, hydrocephalus, extra-axial collection or mass lesion/mass effect. Similar global parenchymal volume loss with ex vacuo dilatation of ventricular system. Mild burden of chronic small vessel white matter disease. Vascular: No hyperdense vessel. Vascular calcifications. 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 Skull: Normal. Negative for fracture or focal lesion. Sinuses/Orbits: No acute finding. Other: None CT CERVICAL SPINE FINDINGS Alignment: Unchanged moderate kyphosis at C5. 3 mm anterolisthesis of C3 on C4, unchanged. Skull base and vertebrae: Negative for fracture Soft tissues and spinal canal: No prevertebral fluid or swelling. No visible canal hematoma. Atherosclerotic calcifications of the carotid arteries bilaterally. Disc levels: Multilevel disc and facet degeneration. Similar cervical spondylosis and spinal stenosis at C4-C5, C5-C6 and C6-C7. Upper chest: Negative Other: None IMPRESSION: 1. No acute intracranial pathology. Similar atrophy and chronic microvascular ischemic changes noted. 2. No acute fracture or static subluxation of the cervical spine. Similar cervical spondylosis as above. Electronically Signed   By: Dahlia Bailiff MD   On: 11/21/2020 17:48   CT Cervical Spine Wo Contrast  Result Date: 11/21/2020 CLINICAL DATA:  Being treated for UTI, increased weakness and falling. EXAM: CT HEAD WITHOUT CONTRAST CT CERVICAL SPINE WITHOUT CONTRAST TECHNIQUE: Multidetector CT imaging of the head and cervical spine was performed following the standard protocol without intravenous contrast. Multiplanar CT image reconstructions of the cervical spine were also generated. COMPARISON:  CT head and cervical spine October 23, 2018. FINDINGS: CT HEAD FINDINGS Brain: No  evidence of acute infarction, hemorrhage, hydrocephalus, extra-axial collection or mass lesion/mass effect. Similar global parenchymal volume loss with ex vacuo dilatation of ventricular system. Mild burden of chronic small vessel white matter disease. Vascular: No hyperdense vessel. Vascular calcifications. 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 Skull: Normal. Negative for fracture or focal lesion. Sinuses/Orbits: No acute finding. Other: None CT CERVICAL SPINE FINDINGS Alignment: Unchanged moderate kyphosis at C5. 3 mm anterolisthesis of C3 on C4, unchanged. Skull base and vertebrae: Negative for fracture Soft tissues and spinal canal: No prevertebral fluid or swelling. No visible canal hematoma. Atherosclerotic calcifications of the carotid arteries bilaterally. Disc levels: Multilevel disc and facet degeneration. Similar cervical spondylosis and spinal stenosis at C4-C5, C5-C6 and C6-C7. Upper chest: Negative Other: None IMPRESSION: 1. No acute intracranial pathology. Similar atrophy and chronic microvascular ischemic changes noted. 2. No acute fracture or static subluxation of the cervical spine. Similar cervical spondylosis as above. Electronically Signed   By: Dahlia Bailiff MD   On: 11/21/2020 17:48   CT CHEST ABDOMEN PELVIS W CONTRAST  Result Date: 11/21/2020 CLINICAL DATA:  86 year old male with multiple falls and abdominal trauma. EXAM: CT CHEST, ABDOMEN, AND PELVIS WITH CONTRAST TECHNIQUE: Multidetector CT imaging of the chest, abdomen and pelvis was performed following the standard protocol during bolus administration of intravenous contrast. CONTRAST:  156mL OMNIPAQUE IOHEXOL 300 MG/ML  SOLN COMPARISON:  CT abdomen pelvis dated 10/05/2020. FINDINGS: CT CHEST FINDINGS Cardiovascular: Top-normal cardiac size. No pericardial effusion. Three-vessel coronary vascular calcification. Advanced atherosclerotic calcification of the thoracic aorta. No aneurysmal dilatation or dissection.  The origins of the great vessels of the aortic arch appear patent. The central pulmonary arteries are grossly unremarkable. Mediastinum/Nodes: No hilar or mediastinal adenopathy. The esophagus and the thyroid gland are grossly unremarkable. No mediastinal fluid collection. Lungs/Pleura: Bilateral calcified pleural plaques, likely sequela of prior asbestos exposure. No focal consolidation, pleural effusion, or pneumothorax. The central airways are patent. Musculoskeletal: Osteopenia with degenerative changes of the spine. Several nondisplaced left anterior rib  fractures involving the third, fourth, fifth and, likely sixth ribs. CT ABDOMEN PELVIS FINDINGS No intra-abdominal free air or free fluid. Hepatobiliary: Subcentimeter left hepatic hypodense lesion is too small to characterize. The liver is otherwise unremarkable. No intrahepatic biliary ductal dilatation. Cholecystectomy. Pancreas: Unremarkable. No pancreatic ductal dilatation or surrounding inflammatory changes. Spleen: Normal in size without focal abnormality. Adrenals/Urinary Tract: The adrenal glands unremarkable. There is no hydronephrosis on either side. There is symmetric enhancement and excretion of contrast by both kidneys. The visualized ureters and urinary bladder appear unremarkable. Stomach/Bowel: Small scattered sigmoid diverticula without active inflammatory changes. There is no bowel obstruction or active inflammation. The appendix is normal. Vascular/Lymphatic: Advanced aortoiliac atherosclerotic disease. The IVC is unremarkable. No portal venous gas. There is no adenopathy. Reproductive: The prostate and seminal vesicles are grossly unremarkable. No pelvic masses Other: Small fat containing bilateral inguinal hernias. No fluid collection. Musculoskeletal: Osteopenia with degenerative changes of the spine. No acute osseous pathology. IMPRESSION: 1. Several nondisplaced left anterior rib fractures. No pneumothorax. 2. No acute/traumatic  intra-abdominal or pelvic pathology. 3. Aortic Atherosclerosis (ICD10-I70.0). Electronically Signed   By: Anner Crete M.D.   On: 11/21/2020 17:47   ECHOCARDIOGRAM COMPLETE  Result Date: 11/22/2020    ECHOCARDIOGRAM REPORT   Patient Name:   Julian Fowler Date of Exam: 11/22/2020 Medical Rec #:  [id number]       Height:       69.0 in Accession #:    [id number]      Weight:       242.5 lb Date of Birth:  07/16/1935       BSA:          2.242 m Patient Age:    58 years        BP:           100/55 mmHg Patient Gender: M               HR:           44 bpm. Exam Location:  [facility] Procedure: 2D Echo Indications:    Atrial Fibrillation I48.91  History:        Patient has prior history of Echocardiogram examinations, most                 recent 09/06/2020. Arrythmias:Atrial Fibrillation,                 Signs/Symptoms:Chest Pain, Syncope and Murmur; Risk                 Factors:Hypertension, Dyslipidemia and Former Smoker.  Sonographer:    Leavy Cella RDCS (AE) Referring Phys: [id number] ASIA B [place]  1. Left ventricular ejection fraction, by estimation, is 60 to 65%. The left ventricle has normal function. The left ventricle has no regional wall motion abnormalities. There is moderate left ventricular hypertrophy. Left ventricular diastolic parameters are indeterminate.  2. Right ventricular systolic function is normal. The right ventricular size is normal.  3. The mitral valve is normal in structure. No evidence of mitral valve regurgitation. No evidence of mitral stenosis.  4. The aortic valve is tricuspid. There is moderate calcification of the aortic valve. There is moderate thickening of the aortic valve. Aortic valve regurgitation is not visualized. Mild aortic valve stenosis. Aortic valve mean gradient measures 10.3 mmHg. Aortic valve peak gradient measures 18.8 mmHg. Aortic valve area, by VTI measures 1.78 cm.  5. The inferior vena cava is normal in size with greater than 50%  respiratory variability, suggesting right atrial pressure of 3 mmHg. FINDINGS  Left Ventricle: Left ventricular ejection fraction, by estimation, is 60 to 65%. The left ventricle has normal function. The left ventricle has no regional wall motion abnormalities. The left ventricular internal cavity size was normal in size. There is  moderate left ventricular hypertrophy. Left ventricular diastolic function could not be evaluated due to atrial fibrillation. Left ventricular diastolic parameters are indeterminate. Right Ventricle: The right ventricular size is normal. No increase in right ventricular wall thickness. Right ventricular systolic function is normal. Left Atrium: Left atrial size was normal in size. Right Atrium: Right atrial size was normal in size. Pericardium: There is no evidence of pericardial effusion. Mitral Valve: The mitral valve is normal in structure. There is mild thickening of the mitral valve leaflet(s). There is mild calcification of the mitral valve leaflet(s). Mild to moderate mitral annular calcification. No evidence of mitral valve regurgitation. No evidence of mitral valve stenosis. Tricuspid Valve: The tricuspid valve is normal in structure. Tricuspid valve regurgitation is trivial. No evidence of tricuspid stenosis. Aortic Valve: The aortic valve is tricuspid. There is moderate calcification of the aortic valve. There is moderate thickening of the aortic valve. There is moderate aortic valve annular calcification. Aortic valve regurgitation is not visualized. Mild aortic stenosis is present. Aortic valve mean gradient measures 10.3 mmHg. Aortic valve peak gradient measures 18.8 mmHg. Aortic valve area, by VTI measures 1.78 cm. Pulmonic Valve: The pulmonic valve was not well visualized. Pulmonic valve regurgitation is not visualized. No evidence of pulmonic stenosis. Aorta: The aortic root is normal in size and structure. Pulmonary Artery: Indeterminant PASP, inadequate TR jet. Venous:  The inferior vena cava is normal in size with greater than 50% respiratory variability, suggesting right atrial pressure of 3 mmHg. IAS/Shunts: No atrial level shunt detected by color flow Doppler.  LEFT VENTRICLE PLAX 2D LVIDd:         3.46 cm  Diastology LVIDs:         2.28 cm  LV e' medial:    0.10 cm/s LV PW:         1.40 cm  LV E/e' medial:  8.7 LV IVS:        1.40 cm  LV e' lateral:   0.10 cm/s LVOT diam:     2.00 cm  LV E/e' lateral: 9.3 LV SV:         69 LV SV Index:   31 LVOT Area:     3.14 cm  RIGHT VENTRICLE RV S prime:     10.40 cm/s LEFT ATRIUM           Index       RIGHT ATRIUM           Index LA diam:      4.90 cm 2.19 cm/m  RA Area:     19.40 cm LA Vol (A2C): 26.0 ml 11.60 ml/m RA Volume:   44.20 ml  19.71 ml/m LA Vol (A4C): 49.7 ml 22.16 ml/m  AORTIC VALVE AV Area (Vmax):    1.66 cm AV Area (Vmean):   1.83 cm AV Area (VTI):     1.78 cm AV Vmax:           217.00 cm/s AV Vmean:          149.667 cm/s AV VTI:            0.387 m AV Peak Grad:      18.8 mmHg AV Mean Grad:  10.3 mmHg LVOT Vmax:         115.00 cm/s LVOT Vmean:        87.200 cm/s LVOT VTI:          0.220 m LVOT/AV VTI ratio: 0.57  AORTA Ao Root diam: 2.90 cm MITRAL VALVE MV Area (PHT): 3.19 cm   SHUNTS MV Decel Time: 238 msec   Systemic VTI:  0.22 m MV E velocity: 0.90 cm/s  Systemic Diam: 2.00 cm Carlyle Dolly MD Electronically signed by Carlyle Dolly MD Signature Date/Time: 11/22/2020/10:59:48 AM    Final         Scheduled Meds: . alfuzosin  10 mg Oral QHS  . Chlorhexidine Gluconate Cloth  6 each Topical Daily  . furosemide  20 mg Oral Daily  . hydrALAZINE  75 mg Oral TID  . losartan  100 mg Oral q morning  . mirabegron ER  25 mg Oral Daily  . mometasone-formoterol  2 puff Inhalation BID  . pravastatin  40 mg Oral QHS   Continuous Infusions: . sodium chloride 100 mL/hr at 11/23/20 0952  . heparin 1,550 Units/hr (11/23/20 0635)     LOS: 2 days    Time spent: 35 minutes    Whitaker Holderman Darleen Crocker, DO [facility]  If 7PM-7AM, please contact night-coverage [email or web address] 11/23/2020, 2:10 PM

## 2020-11-23 NOTE — Progress Notes (Signed)
Progress Note  Patient Name: Julian Fowler Date of Encounter: 11/23/2020  [facility] Cardiologist: Rozann Lesches, MD  Subjective   Patient is sitting in the bed and reports no chest pains except had the chest wall injury.  Patient reports no shortness of breath.  No dizziness.  Inpatient Medications    Scheduled Meds: . alfuzosin  10 mg Oral QHS  . Chlorhexidine Gluconate Cloth  6 each Topical Daily  . furosemide  20 mg Oral Daily  . hydrALAZINE  75 mg Oral TID  . losartan  100 mg Oral q morning  . mirabegron ER  25 mg Oral Daily  . mometasone-formoterol  2 puff Inhalation BID  . pravastatin  40 mg Oral QHS   Continuous Infusions: . sodium chloride 100 mL/hr at 11/23/20 0635  . heparin 1,550 Units/hr (11/23/20 0635)   PRN Meds: acetaminophen **OR** acetaminophen, albuterol, guaiFENesin-dextromethorphan, iohexol, LORazepam, ondansetron **OR** ondansetron (ZOFRAN) IV, oxyCODONE, zolpidem   Vital Signs    Vitals:   11/23/20 0400 11/23/20 0500 11/23/20 0600 11/23/20 0700  BP: (!) 121/50 115/63 (!) 142/67 (!) 124/24  Pulse: 63 71 (!) 55 73  Resp: 13 12 13 15   Temp: 97.9 F (36.6 C)     TempSrc: Oral     SpO2: 92% 97% 96% 97%  Weight:      Height:        Intake/Output Summary (Last 24 hours) at 11/23/2020 0820 Last data filed at 11/23/2020 2130 Gross per 24 hour  Intake 2927.9 ml  Output 1500 ml  Net 1427.9 ml   Last 3 Weights 11/21/2020 11/21/2020 10/19/2020  Weight (lbs) 242 lb 8.1 oz 247 lb 247 lb 9.6 oz  Weight (kg) 110 kg 112.038 kg 112.311 kg      Telemetry    Atrial fibrillation with controlled rates. Pauses up to 2.3 seconds. - Personally Reviewed  ECG    No new tracings.  Physical Exam   GEN: No acute distress.   Neck: No JVD Cardiac: Normal rate and irregular rhythm, no murmurs, rubs, or gallops.  Respiratory: Clear to auscultation bilaterally.  Mild tenderness is noted in the left side of the chest. GI: Soft, nontender, non-distended  MS:  No edema; No deformity. Neuro:  Nonfocal  Psych: Normal affect   Labs    High Sensitivity Troponin:   Recent Labs  Lab 11/21/20 1247 11/21/20 1412  TROPONINIHS 5 5      Chemistry Recent Labs  Lab 11/21/20 1247 11/21/20 2110 11/22/20 0546 11/22/20 1658 11/23/20 0532  NA 111*   < > 116* 116* 117*  K 4.0  --  3.6  --  4.2  CL 79*  --  85*  --  88*  CO2 22  --  23  --  23  GLUCOSE 100*  --  99  --  103*  BUN 17  --  14  --  21  CREATININE 1.01  --  0.82  --  0.87  CALCIUM 8.3*  --  8.0*  --  8.1*  PROT 6.5  --  5.5*  --   --   ALBUMIN 3.0*  --  2.6*  --   --   AST 33  --  37  --   --   ALT 69*  --  76*  --   --   ALKPHOS 62  --  54  --   --   BILITOT 0.7  --  0.6  --   --   GFRNONAA >  60  --  >60  --  >60  ANIONGAP 10  --  8  --  6   < > = values in this interval not displayed.     Hematology Recent Labs  Lab 11/21/20 1247 11/22/20 0546 11/23/20 0532  WBC 6.4 6.0 6.8  RBC 4.11* 3.80* 3.76*  HGB 12.6* 11.6* 11.6*  HCT 35.7* 33.4* 33.2*  MCV 86.9 87.9 88.3  MCH 30.7 30.5 30.9  MCHC 35.3 34.7 34.9  RDW 13.1 13.2 13.3  PLT 426* 434* 443*    BNPNo results for input(s): BNP, PROBNP in the last 168 hours.   DDimer No results for input(s): DDIMER in the last 168 hours.   Radiology    DG Pelvis 1-2 Views  Result Date: 11/21/2020 CLINICAL DATA:  Pelvic tenderness EXAM: PELVIS - 1-2 VIEW COMPARISON:  None. FINDINGS: There is no evidence of pelvic fracture or diastasis. No pelvic bone lesions are seen. Vascular calcifications noted IMPRESSION: No acute osseous abnormality. Electronically Signed   By: Suzy Bouchard M.D.   On: 11/21/2020 13:43   CT Head Wo Contrast  Result Date: 11/21/2020 CLINICAL DATA:  Being treated for UTI, increased weakness and falling. EXAM: CT HEAD WITHOUT CONTRAST CT CERVICAL SPINE WITHOUT CONTRAST TECHNIQUE: Multidetector CT imaging of the head and cervical spine was performed following the standard protocol without intravenous contrast.  Multiplanar CT image reconstructions of the cervical spine were also generated. COMPARISON:  CT head and cervical spine October 23, 2018. FINDINGS: CT HEAD FINDINGS Brain: No evidence of acute infarction, hemorrhage, hydrocephalus, extra-axial collection or mass lesion/mass effect. Similar global parenchymal volume loss with ex vacuo dilatation of ventricular system. Mild burden of chronic small vessel white matter disease. Vascular: No hyperdense vessel. Vascular calcifications. 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 Skull: Normal. Negative for fracture or focal lesion. Sinuses/Orbits: No acute finding. Other: None CT CERVICAL SPINE FINDINGS Alignment: Unchanged moderate kyphosis at C5. 3 mm anterolisthesis of C3 on C4, unchanged. Skull base and vertebrae: Negative for fracture Soft tissues and spinal canal: No prevertebral fluid or swelling. No visible canal hematoma. Atherosclerotic calcifications of the carotid arteries bilaterally. Disc levels: Multilevel disc and facet degeneration. Similar cervical spondylosis and spinal stenosis at C4-C5, C5-C6 and C6-C7. Upper chest: Negative Other: None IMPRESSION: 1. No acute intracranial pathology. Similar atrophy and chronic microvascular ischemic changes noted. 2. No acute fracture or static subluxation of the cervical spine. Similar cervical spondylosis as above. Electronically Signed   By: Dahlia Bailiff MD   On: 11/21/2020 17:48   CT Cervical Spine Wo Contrast  Result Date: 11/21/2020 CLINICAL DATA:  Being treated for UTI, increased weakness and falling. EXAM: CT HEAD WITHOUT CONTRAST CT CERVICAL SPINE WITHOUT CONTRAST TECHNIQUE: Multidetector CT imaging of the head and cervical spine was performed following the standard protocol without intravenous contrast. Multiplanar CT image reconstructions of the cervical spine were also generated. COMPARISON:  CT head and cervical spine October 23, 2018. FINDINGS: CT HEAD FINDINGS Brain: No evidence of  acute infarction, hemorrhage, hydrocephalus, extra-axial collection or mass lesion/mass effect. Similar global parenchymal volume loss with ex vacuo dilatation of ventricular system. Mild burden of chronic small vessel white matter disease. Vascular: No hyperdense vessel. Vascular calcifications. 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 Skull: Normal. Negative for fracture or focal lesion. Sinuses/Orbits: No acute finding. Other: None CT CERVICAL SPINE FINDINGS Alignment: Unchanged moderate kyphosis at C5. 3 mm anterolisthesis of C3 on C4, unchanged. Skull base and vertebrae: Negative for fracture  Soft tissues and spinal canal: No prevertebral fluid or swelling. No visible canal hematoma. Atherosclerotic calcifications of the carotid arteries bilaterally. Disc levels: Multilevel disc and facet degeneration. Similar cervical spondylosis and spinal stenosis at C4-C5, C5-C6 and C6-C7. Upper chest: Negative Other: None IMPRESSION: 1. No acute intracranial pathology. Similar atrophy and chronic microvascular ischemic changes noted. 2. No acute fracture or static subluxation of the cervical spine. Similar cervical spondylosis as above. Electronically Signed   By: Dahlia Bailiff MD   On: 11/21/2020 17:48   CT CHEST ABDOMEN PELVIS W CONTRAST  Result Date: 11/21/2020 CLINICAL DATA:  86 year old male with multiple falls and abdominal trauma. EXAM: CT CHEST, ABDOMEN, AND PELVIS WITH CONTRAST TECHNIQUE: Multidetector CT imaging of the chest, abdomen and pelvis was performed following the standard protocol during bolus administration of intravenous contrast. CONTRAST:  127mL OMNIPAQUE IOHEXOL 300 MG/ML  SOLN COMPARISON:  CT abdomen pelvis dated 10/05/2020. FINDINGS: CT CHEST FINDINGS Cardiovascular: Top-normal cardiac size. No pericardial effusion. Three-vessel coronary vascular calcification. Advanced atherosclerotic calcification of the thoracic aorta. No aneurysmal dilatation or dissection. The origins  of the great vessels of the aortic arch appear patent. The central pulmonary arteries are grossly unremarkable. Mediastinum/Nodes: No hilar or mediastinal adenopathy. The esophagus and the thyroid gland are grossly unremarkable. No mediastinal fluid collection. Lungs/Pleura: Bilateral calcified pleural plaques, likely sequela of prior asbestos exposure. No focal consolidation, pleural effusion, or pneumothorax. The central airways are patent. Musculoskeletal: Osteopenia with degenerative changes of the spine. Several nondisplaced left anterior rib fractures involving the third, fourth, fifth and, likely sixth ribs. CT ABDOMEN PELVIS FINDINGS No intra-abdominal free air or free fluid. Hepatobiliary: Subcentimeter left hepatic hypodense lesion is too small to characterize. The liver is otherwise unremarkable. No intrahepatic biliary ductal dilatation. Cholecystectomy. Pancreas: Unremarkable. No pancreatic ductal dilatation or surrounding inflammatory changes. Spleen: Normal in size without focal abnormality. Adrenals/Urinary Tract: The adrenal glands unremarkable. There is no hydronephrosis on either side. There is symmetric enhancement and excretion of contrast by both kidneys. The visualized ureters and urinary bladder appear unremarkable. Stomach/Bowel: Small scattered sigmoid diverticula without active inflammatory changes. There is no bowel obstruction or active inflammation. The appendix is normal. Vascular/Lymphatic: Advanced aortoiliac atherosclerotic disease. The IVC is unremarkable. No portal venous gas. There is no adenopathy. Reproductive: The prostate and seminal vesicles are grossly unremarkable. No pelvic masses Other: Small fat containing bilateral inguinal hernias. No fluid collection. Musculoskeletal: Osteopenia with degenerative changes of the spine. No acute osseous pathology. IMPRESSION: 1. Several nondisplaced left anterior rib fractures. No pneumothorax. 2. No acute/traumatic intra-abdominal or  pelvic pathology. 3. Aortic Atherosclerosis (ICD10-I70.0). Electronically Signed   By: Anner Crete M.D.   On: 11/21/2020 17:47   DG Chest Portable 1 View  Result Date: 11/21/2020 CLINICAL DATA:  Multiple falls. EXAM: PORTABLE CHEST 1 VIEW COMPARISON:  Chest x-ray 06/24/2020 FINDINGS: Mild cardiac enlargement. Moderate tortuosity and calcification of the thoracic aorta. 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 There is moderate eventration of the right hemidiaphragm. No infiltrates, edema or effusions. No pneumothorax. No acute bony findings.  No obvious acute rib fractures. IMPRESSION: 1. Mild cardiac enlargement. 2. 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 3. No acute cardiopulmonary findings. 4. Grossly intact bony thorax. Electronically Signed   By: Marijo Sanes M.D.   On: 11/21/2020 13:38     Cardiac Studies   ECHOCARDIOGRAM COMPLETE  Result Date: 11/22/2020    ECHOCARDIOGRAM REPORT   Patient Name:   Julian Fowler  Date of Exam: 11/22/2020 Medical Rec #:  [id number]       Height:       69.0 in Accession #:    [id number]      Weight:       242.5 lb Date of Birth:  1935-03-05       BSA:          2.242 m Patient Age:    51 years        BP:           100/55 mmHg Patient Gender: M               HR:           44 bpm. Exam Location:  [facility] Procedure: 2D Echo Indications:    Atrial Fibrillation I48.91  History:        Patient has prior history of Echocardiogram examinations, most                 recent 09/06/2020. Arrythmias:Atrial Fibrillation,                 Signs/Symptoms:Chest Pain, Syncope and Murmur; Risk                 Factors:Hypertension, Dyslipidemia and Former Smoker.  Sonographer:    Leavy Cella RDCS (AE) Referring Phys: [id number] ASIA B Perry  1. Left ventricular ejection fraction, by estimation, is 60 to 65%. The left ventricle has normal function. The left  ventricle has no regional wall motion abnormalities. There is moderate left ventricular hypertrophy. Left ventricular diastolic parameters are indeterminate.  2. Right ventricular systolic function is normal. The right ventricular size is normal.  3. The mitral valve is normal in structure. No evidence of mitral valve regurgitation. No evidence of mitral stenosis.  4. The aortic valve is tricuspid. There is moderate calcification of the aortic valve. There is moderate thickening of the aortic valve. Aortic valve regurgitation is not visualized. Mild aortic valve stenosis. Aortic valve mean gradient measures 10.3 mmHg. Aortic valve peak gradient measures 18.8 mmHg. Aortic valve area, by VTI measures 1.78 cm.  5. The inferior vena cava is normal in size with greater than 50% respiratory variability, suggesting right atrial pressure of 3 mmHg. FINDINGS  Left Ventricle: Left ventricular ejection fraction, by estimation, is 60 to 65%. The left ventricle has normal function. The left ventricle has no regional wall motion abnormalities. The left ventricular internal cavity size was normal in size. There is  moderate left ventricular hypertrophy. Left ventricular diastolic function could not be evaluated due to atrial fibrillation. Left ventricular diastolic parameters are indeterminate. Right Ventricle: The right ventricular size is normal. No increase in right ventricular wall thickness. Right ventricular systolic function is normal. Left Atrium: Left atrial size was normal in size. Right Atrium: Right atrial size was normal in size. Pericardium: There is no evidence of pericardial effusion. Mitral Valve: The mitral valve is normal in structure. There is mild thickening of the mitral valve leaflet(s). There is mild calcification of the mitral valve leaflet(s). Mild to moderate mitral annular calcification. No evidence of mitral valve regurgitation. No evidence of mitral valve stenosis. Tricuspid Valve: The tricuspid  valve is normal in structure. Tricuspid valve regurgitation is trivial. No evidence of tricuspid stenosis. Aortic Valve: The aortic valve is tricuspid. There is moderate calcification of the aortic valve. There is moderate thickening of the aortic valve. There is moderate aortic valve annular calcification. Aortic valve regurgitation is not visualized. Mild  aortic stenosis is present. Aortic valve mean gradient measures 10.3 mmHg. Aortic valve peak gradient measures 18.8 mmHg. Aortic valve area, by VTI measures 1.78 cm. Pulmonic Valve: The pulmonic valve was not well visualized. Pulmonic valve regurgitation is not visualized. No evidence of pulmonic stenosis. Aorta: The aortic root is normal in size and structure. Pulmonary Artery: Indeterminant PASP, inadequate TR jet. Venous: The inferior vena cava is normal in size with greater than 50% respiratory variability, suggesting right atrial pressure of 3 mmHg. IAS/Shunts: No atrial level shunt detected by color flow Doppler.  LEFT VENTRICLE PLAX 2D LVIDd:         3.46 cm  Diastology LVIDs:         2.28 cm  LV e' medial:    0.10 cm/s LV PW:         1.40 cm  LV E/e' medial:  8.7 LV IVS:        1.40 cm  LV e' lateral:   0.10 cm/s LVOT diam:     2.00 cm  LV E/e' lateral: 9.3 LV SV:         69 LV SV Index:   31 LVOT Area:     3.14 cm  RIGHT VENTRICLE RV S prime:     10.40 cm/s LEFT ATRIUM           Index       RIGHT ATRIUM           Index LA diam:      4.90 cm 2.19 cm/m  RA Area:     19.40 cm LA Vol (A2C): 26.0 ml 11.60 ml/m RA Volume:   44.20 ml  19.71 ml/m LA Vol (A4C): 49.7 ml 22.16 ml/m  AORTIC VALVE AV Area (Vmax):    1.66 cm AV Area (Vmean):   1.83 cm AV Area (VTI):     1.78 cm AV Vmax:           217.00 cm/s AV Vmean:          149.667 cm/s AV VTI:            0.387 m AV Peak Grad:      18.8 mmHg AV Mean Grad:      10.3 mmHg LVOT Vmax:         115.00 cm/s LVOT Vmean:        87.200 cm/s LVOT VTI:          0.220 m LVOT/AV VTI ratio: 0.57  AORTA Ao Root diam:  2.90 cm MITRAL VALVE MV Area (PHT): 3.19 cm   SHUNTS MV Decel Time: 238 msec   Systemic VTI:  0.22 m MV E velocity: 0.90 cm/s  Systemic Diam: 2.00 cm Carlyle Dolly MD Electronically signed by Carlyle Dolly MD Signature Date/Time: 11/22/2020/10:59:48 AM    Final     Patient Profile     86 y.o. male with a hx of PSVT, mild AS, coronary calcification on CT who is being seen today for the evaluation of new Afib at the request of Dr. Roderic Palau.  Assessment & Plan    Atrial fibrillation: Rate appears to be adequately controlled.  Patient appears to be high risk for anticoagulation therapy.  Patient will benefit from Holter monitor As an outpatient when discharged.  Hyponatremia: Patient with history of chronic hyponatremia.  Etiology not clear.  Sodium appears to be improving.  Would recommend continuing current medical therapy including normal saline infusion.  History of hypertension: Blood pressure appears to be in adequate range  at this time.     For questions or updates, please contact [place] Please consult [email or web address] for contact info under        Signed, Curtis Sites, MD  11/23/2020, 8:20 AM

## 2020-11-23 NOTE — Progress Notes (Signed)
Bandages on right arm where abrasions are changed per patient request.

## 2020-11-24 DIAGNOSIS — E871 Hypo-osmolality and hyponatremia: Secondary | ICD-10-CM | POA: Diagnosis not present

## 2020-11-24 LAB — RENAL FUNCTION PANEL
Albumin: 2.5 g/dL — ABNORMAL LOW (ref 3.5–5.0)
Anion gap: 4 — ABNORMAL LOW (ref 5–15)
BUN: 15 mg/dL (ref 8–23)
CO2: 24 mmol/L (ref 22–32)
Calcium: 7.9 mg/dL — ABNORMAL LOW (ref 8.9–10.3)
Chloride: 92 mmol/L — ABNORMAL LOW (ref 98–111)
Creatinine, Ser: 0.79 mg/dL (ref 0.61–1.24)
GFR, Estimated: >60 mL/min (ref >60)
Glucose, Bld: 95 mg/dL (ref 70–99)
Phosphorus: 3.9 mg/dL (ref 2.5–4.6)
Potassium: 4.1 mmol/L (ref 3.5–5.1)
Sodium: 120 mmol/L — ABNORMAL LOW (ref 135–145)

## 2020-11-24 LAB — CBC
HCT: 33.6 % — ABNORMAL LOW (ref 39.0–52.0)
Hemoglobin: 11.3 g/dL — ABNORMAL LOW (ref 13.0–17.0)
MCH: 30.4 pg (ref 26.0–34.0)
MCHC: 33.6 g/dL (ref 30.0–36.0)
MCV: 90.3 fL (ref 80.0–100.0)
Platelets: 443 10*3/uL — ABNORMAL HIGH (ref 150–400)
RBC: 3.72 MIL/uL — ABNORMAL LOW (ref 4.22–5.81)
RDW: 13.5 % (ref 11.5–15.5)
WBC: 6 10*3/uL (ref 4.0–10.5)
nRBC: 0 % (ref 0.0–0.2)

## 2020-11-24 LAB — HEPARIN LEVEL (UNFRACTIONATED)
Heparin Unfractionated: 0.88 IU/mL — ABNORMAL HIGH (ref 0.30–0.70)
Heparin Unfractionated: 0.75 IU/mL — ABNORMAL HIGH (ref 0.30–0.70)

## 2020-11-24 LAB — SODIUM
Sodium: 118 mmol/L — CL (ref 135–145)
Sodium: 119 mmol/L — CL (ref 135–145)
Sodium: 122 mmol/L — ABNORMAL LOW (ref 135–145)

## 2020-11-24 MED ORDER — LIDOCAINE 5 % EX PTCH
1.0000 | MEDICATED_PATCH | Freq: Once | CUTANEOUS | Status: AC
  Administered 2020-11-24: 1 via TRANSDERMAL
  Filled 2020-11-24 (×2): qty 1

## 2020-11-24 MED ORDER — SODIUM CHLORIDE 3 % IV SOLN
INTRAVENOUS | Status: AC
  Filled 2020-11-24: qty 500

## 2020-11-24 MED ORDER — HEPARIN (PORCINE) 25000 UT/250ML-% IV SOLN
1300.0000 [IU]/h | INTRAVENOUS | Status: DC
  Administered 2020-11-24 – 2020-11-28 (×5): 1300 [IU]/h via INTRAVENOUS
  Filled 2020-11-24 (×4): qty 250

## 2020-11-24 MED ORDER — CALCIUM CARBONATE ANTACID 500 MG PO CHEW
1.0000 | CHEWABLE_TABLET | Freq: Two times a day (BID) | ORAL | Status: DC | PRN
  Administered 2020-11-24 – 2020-11-25 (×2): 200 mg via ORAL
  Filled 2020-11-24 (×2): qty 1

## 2020-11-24 MED ORDER — KETOROLAC TROMETHAMINE 15 MG/ML IJ SOLN
15.0000 mg | Freq: Once | INTRAMUSCULAR | Status: AC
  Administered 2020-11-24: 15 mg via INTRAVENOUS
  Filled 2020-11-24: qty 1

## 2020-11-24 MED ORDER — SODIUM CHLORIDE 3 % IV BOLUS: 100.0000 mL | Freq: Once | INTRAVENOUS | Status: DC

## 2020-11-24 MED ORDER — MELATONIN 3 MG PO TABS
6.0000 mg | ORAL_TABLET | Freq: Every day | ORAL | Status: DC
  Administered 2020-11-24 – 2020-11-27 (×5): 6 mg via ORAL
  Filled 2020-11-24 (×5): qty 2

## 2020-11-24 NOTE — Progress Notes (Addendum)
ANTICOAGULATION CONSULT NOTE -   Pharmacy Consult for heparin gtt  Indication: atrial fibrillation  Allergies  Allergen Reactions  . Prednisone     Pt is high functioning and out of sorts.  . Codeine Other (See Comments)    REACTION: groggy  . Tape Other (See Comments)    Skin tears, use paper tape    Patient Measurements: Height: 5\' 9"  (175.3 cm) Weight: 115.3 kg (254 lb 3.1 oz) IBW/kg (Calculated) : 70.7 HEPARIN DW (KG): 95.5   Vital Signs: Temp: 97 F (36.1 C) (02/10 0457) Temp Source: Axillary (02/10 0457) BP: 143/72 (02/10 0600)  Labs: Recent Labs    11/22/20 0546 11/22/20 1636 11/23/20 0532 11/24/20 0601  HGB 11.6*  --  11.6* 11.3*  HCT 33.4*  --  33.2* 33.6*  PLT 434*  --  443* 443*  HEPARINUNFRC 0.62 0.58 0.56 0.88*  CREATININE 0.82  --  0.87 0.79    Estimated Creatinine Clearance: 87.6 mL/min (by C-G formula based on SCr of 0.79 mg/dL).   Assessment: Julian Fowler a 86 y.o. male requires anticoagulation with a heparin iv infusion for the indication of  atrial fibrillation. Heparin gtt  started following pharmacy protocol per pharmacy consult. Patient was not on previous oral anti-coagulation.  11/24/20 0900 update Heparin level:  0.88 IU/mL, above  goal range on heparin at 1550 units/hr CBC:  Hb 11.3      Plates: 443K RN reports some bleeding on the hand where  infusion site is located; it has since stopped  Goal of Therapy:  Heparin level 0.3-0.7 units/ml Monitor platelets by anticoagulation protocol: Yes   Plan:  Reduce  heparin infusion to 1400 units/hr Daily heparin level and CBC while on heparin Monitor for signs and symptoms of bleeding  Despina Pole, Pharm. D. Clinical Pharmacist 11/24/2020 8:53 AM

## 2020-11-24 NOTE — Progress Notes (Signed)
Progress Note  Patient Name: Julian Fowler Date of Encounter: 11/24/2020  [facility] Cardiologist: Rozann Lesches, MD   Subjective  Patient is sitting in the bed.  He is having discussion with his wife.  Inpatient Medications    Scheduled Meds: . alfuzosin  10 mg Oral QHS  . Chlorhexidine Gluconate Cloth  6 each Topical Daily  . furosemide  20 mg Oral Daily  . hydrALAZINE  75 mg Oral TID  . lidocaine  1 patch Transdermal Once  . losartan  100 mg Oral q morning  . melatonin  6 mg Oral QHS  . mirabegron ER  25 mg Oral Daily  . mometasone-formoterol  2 puff Inhalation BID  . pravastatin  40 mg Oral QHS   Continuous Infusions: . sodium chloride 100 mL/hr at 11/24/20 0648  . heparin 1,400 Units/hr (11/24/20 0900)   PRN Meds:    Vital Signs    Vitals:   11/24/20 0400 11/24/20 0457 11/24/20 0600 11/24/20 0755  BP: (!) 121/47  (!) 143/72   Pulse:      Resp: 12  10   Temp:  (!) 97 F (36.1 C)    TempSrc:  Axillary    SpO2:    98%  Weight:  115.3 kg    Height:        Intake/Output Summary (Last 24 hours) at 11/24/2020 0942 Last data filed at 11/24/2020 1610 Gross per 24 hour  Intake 2451.21 ml  Output 2100 ml  Net 351.21 ml   Last 3 Weights 11/24/2020 11/21/2020 11/21/2020  Weight (lbs) 254 lb 3.1 oz 242 lb 8.1 oz 247 lb  Weight (kg) 115.3 kg 110 kg 112.038 kg      Telemetry    Telemetry review showed controlled mental rate 60 to 70 bpm- Personally Reviewed  ECG    EKG showed atrial fibrillation with controlled ventricular rate- Personally Reviewed  Physical Exam   GEN: No acute distress.   Neck: No JVD Cardiac:  Irregular irregular rhythm, no murmurs, rubs, or gallops.  Respiratory: Clear to auscultation bilaterally. GI: Soft, nontender, non-distended  MS: No edema; No deformity. Neuro:  Nonfocal  Psych: Normal affect   Labs    High Sensitivity Troponin:   Recent Labs  Lab 11/21/20 1247 11/21/20 1412  TROPONINIHS 5 5       Chemistry Recent Labs  Lab 11/21/20 1247 11/21/20 2110 11/22/20 0546 11/22/20 1658 11/23/20 0532 11/23/20 1119 11/23/20 2023 11/24/20 0013 11/24/20 0601  NA 111*   < > 116*   < > 117*   < > 119* 118* 120*  K 4.0  --  3.6  --  4.2  --   --   --  4.1  CL 79*  --  85*  --  88*  --   --   --  92*  CO2 22  --  23  --  23  --   --   --  24  GLUCOSE 100*  --  99  --  103*  --   --   --  95  BUN 17  --  14  --  21  --   --   --  15  CREATININE 1.01  --  0.82  --  0.87  --   --   --  0.79  CALCIUM 8.3*  --  8.0*  --  8.1*  --   --   --  7.9*  PROT 6.5  --  5.5*  --   --   --   --   --   --  ALBUMIN 3.0*  --  2.6*  --   --   --   --   --  2.5*  AST 33  --  37  --   --   --   --   --   --   ALT 69*  --  76*  --   --   --   --   --   --   ALKPHOS 62  --  54  --   --   --   --   --   --   BILITOT 0.7  --  0.6  --   --   --   --   --   --   GFRNONAA >60  --  >60  --  >60  --   --   --  >60  ANIONGAP 10  --  8  --  6  --   --   --  4*   < > = values in this interval not displayed.     Hematology Recent Labs  Lab 11/22/20 0546 11/23/20 0532 11/24/20 0601  WBC 6.0 6.8 6.0  RBC 3.80* 3.76* 3.72*  HGB 11.6* 11.6* 11.3*  HCT 33.4* 33.2* 33.6*  MCV 87.9 88.3 90.3  MCH 30.5 30.9 30.4  MCHC 34.7 34.9 33.6  RDW 13.2 13.3 13.5  PLT 434* 443* 443*    BNPNo results for input(s): BNP, PROBNP in the last 168 hours.   DDimer No results for input(s): DDIMER in the last 168 hours.   Radiology    No results found.  Cardiac Studies    Patient Profile     86 y.o. male male with hyponatremia problems and chronic atrial fibrillation.  Rate appears to be adequately controlled at this time.  Patient was started on anticoagulation therapy with heparin.  Is tolerating this well.  Sodium has improved  Assessment & Plan   Atrial fibrillation: Rate is adequately controlled at this time.  Patient is tolerating anticoagulation therapy with heparin.  I would recommend continuing current  medical therapy.  Hyponatremia: Sodium in the 120 range at this time.  History of hypertension: Blood pressure has improved.  I would recommend continue current medical therapy.      For questions or updates, please contact [place] Please consult [email or web address] for contact info under        Signed, Curtis Sites, MD  11/24/2020, 9:42 AM

## 2020-11-24 NOTE — Progress Notes (Signed)
PROGRESS NOTE    Julian Fowler  VZD:[id number] DOB: [birth year] DOA: 11/21/2020 PCP: [name], MD   Brief Narrative:  86 year old male with a history of BPH, paroxysmal SVT, obstructive sleep apnea, admitted to the hospital with generalized weakness, falls. He was found to be significantly hyponatremic. Also found to have rib fractures related to fall. Was also noted to have new onset atrial fibrillation. Being seen by nephrology and cardiology. Serum sodium level slowly improving. A. fib is controlled. Anticipate discharge in the next 1 to 2 days.  Assessment & Plan:   Principal Problem:   Acute hyponatremia Active Problems:   Essential hypertension   New onset a-fib (HCC)   Falls   Rib fractures   Acute on chronic hyponatremia-improving -Admitted with weakness, confusion and falls -Sodium of 111 on admission, now around 119 -Discontinue normal saline loop diuretic; plan to bolus 3% hypertonic saline per nephrology recommendations today -Overall serum sodium has been trending up -Urine osmolality is low -Appreciate ongoing nephrology input -Patient reports poor p.o. 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 Will need to be readdressed as an outpatient -Will need Holter on discharge -Continues to be rate controlled -On heparin drip  Falls with rib fractures -Secondary to weakness -Seen by PT with recommendation for skilled nursing facility -Patient and family will likely elect to go home with home health  Hypertension-soft bps currently  noted -On losartan and hold hydralazine on 2/10 -Blood pressure stable -Now on Lasix per Nephrology  BPH -Continue alfuzosin   DVT prophylaxis:Heparin drip Code Status:Full code Family Communication:Discussed with wife at the bedside Disposition Plan:Status is: Inpatient  Remains inpatient appropriate because:Persistent severe electrolyte disturbances   Dispo: The patient is from:Home Anticipated d/c is IR:[facility] with home health Anticipated d/c date is: 1-2 days Patient currently is not medically stable to d/c. Difficult to place patient No    Consultants:  Nephrology  Cardiology  Procedures:  See below  Antimicrobials:   None  Subjective: Patient seen and evaluated today with no new acute complaints or concerns. No acute concerns or events noted overnight. He is somnolent this am.  Objective: Vitals:   11/24/20 0755 11/24/20 0800 11/24/20 1000 11/24/20 1300  BP:  (!) 119/56 (!) 96/41   Pulse:    71  Resp:  16 13 18   Temp:    98.1 F (36.7 C)  TempSrc:    Oral  SpO2: 98%   98%  Weight:      Height:        Intake/Output Summary (Last 24 hours) at 11/24/2020 1436 Last data filed at 11/24/2020 0900 Gross per 24 hour  Intake 2829.86 ml  Output 2325 ml  Net 504.86 ml   Filed Weights   11/21/20 1104 11/21/20 2200 11/24/20 0457  Weight: 112 kg 110 kg 115.3 kg    Examination:  General exam: Appears calm and comfortable, somnolent  Respiratory system: Clear to auscultation. Respiratory effort normal. Cardiovascular system: S1 & S2 heard, irregular.  Gastrointestinal system: Abdomen is soft Central nervous system: Alert and awake Extremities: No edema Skin: No significant lesions noted Psychiatry:  Flat affect.    Data Reviewed: I have personally reviewed following labs and imaging studies  CBC: Recent Labs  Lab 11/21/20 1247 11/22/20 0546 11/23/20 0532 11/24/20 0601   WBC 6.4 6.0 6.8 6.0  NEUTROABS 4.5  --   --   --   HGB 12.6* 11.6* 11.6* 11.3*  HCT 35.7* 33.4* 33.2* 33.6*  MCV 86.9 87.9 88.3 90.3  PLT 426* 434* 443* 932*   Basic Metabolic Panel: Recent Labs  Lab 11/21/20 1247 11/21/20 2110 11/22/20 0546 11/22/20 1658 11/23/20 0532 11/23/20 1119 11/23/20 2023 11/24/20 0013 11/24/20 0601 11/24/20 1037 11/24/20 1203  NA 111* 115* 116*   < > 117*   < > 119* 118* 120* QUESTIONABLE RESULTS, RECOMMEND RECOLLECT TO VERIFY 119*  K 4.0  --  3.6  --  4.2  --   --   --  4.1  --   --   CL 79*  --  85*  --  88*  --   --   --  92*  --   --   CO2 22  --  23  --  23  --   --   --  24  --   --   GLUCOSE 100*  --  99  --  103*  --   --   --  95  --   --   BUN 17  --  14  --  21  --   --   --  15  --   --   CREATININE 1.01  --  0.82  --  0.87  --   --   --  0.79  --   --   CALCIUM 8.3*  --  8.0*  --  8.1*  --   --   --  7.9*  --   --   MG  --  1.7  --   --   --   --   --   --   --   --   --   PHOS  --   --   --   --   --   --   --   --  3.9  --   --    < > = values in this interval not displayed.   GFR: Estimated Creatinine Clearance: 87.6 mL/min (by C-G formula based on SCr of 0.79 mg/dL). Liver Function Tests: Recent Labs  Lab 11/21/20 1247 11/22/20 0546 11/24/20 0601  AST 33 37  --   ALT 69* 76*  --   ALKPHOS 62 54  --   BILITOT 0.7 0.6  --   PROT 6.5 5.5*  --   ALBUMIN 3.0* 2.6* 2.5*   Recent Labs  Lab 11/21/20 1247  LIPASE 50   No results for input(s): AMMONIA in the last 168 hours. Coagulation Profile: No results for input(s): INR, PROTIME in the last 168 hours. Cardiac Enzymes: No results for input(s): CKTOTAL, CKMB, CKMBINDEX, TROPONINI in the last 168 hours. BNP (last 3 results) No results for input(s): PROBNP in the last 8760 hours. HbA1C: No results for input(s): HGBA1C in the last 72 hours. CBG: No results for input(s): GLUCAP in the last 168 hours. Lipid Profile: No results for input(s): CHOL, HDL, LDLCALC, TRIG,  CHOLHDL, LDLDIRECT in the last 72 hours. Thyroid Function Tests: Recent Labs    11/22/20 0546 11/22/20 0916  TSH 0.238*  --   FREET4  --  1.51*   Anemia Panel: No results for  input(s): VITAMINB12, FOLATE, FERRITIN, TIBC, IRON, RETICCTPCT in the last 72 hours. Sepsis Labs: Recent Labs  Lab 11/21/20 1248  LATICACIDVEN 0.6    Recent Results (from the past 240 hour(s))  Urine culture     Status: Abnormal   Collection Time: 11/21/20 12:30 PM   Specimen: Urine, Random  Result Value Ref Range Status   Specimen Description   Final    URINE, RANDOM Performed at [facility], [address] [place], [place] [address]    Special Requests   Final    NONE Performed at [facility], [address], [place], [place] [address]    Culture (A)  Final    <10,000 COLONIES/mL INSIGNIFICANT GROWTH Performed at [facility] [address], [place], [place] [address]    Report Status 11/22/2020 FINAL  Final  Blood culture (routine x 2)     Status: None (Preliminary result)   Collection Time: 11/21/20 12:46 PM   Specimen: BLOOD  Result Value Ref Range Status   Specimen Description BLOOD RIGHT ANTECUBITAL  Final   Special Requests   Final    BOTTLES DRAWN AEROBIC AND ANAEROBIC Blood Culture adequate volume   Culture   Final    NO GROWTH 3 DAYS Performed at [facility], [address], [place], [place] [address]    Report Status PENDING  Incomplete  Blood culture (routine x 2)     Status: None (Preliminary result)   Collection Time: 11/21/20 12:54 PM   Specimen: BLOOD  Result Value Ref Range Status   Specimen Description BLOOD LEFT ANTECUBITAL  Final   Special Requests   Final    BOTTLES DRAWN AEROBIC AND ANAEROBIC Blood Culture adequate volume   Culture   Final    NO GROWTH 3 DAYS Performed at [facility], [address], [place], [place] [address]    Report Status PENDING  Incomplete  SARS Coronavirus 2 by RT PCR (hospital order, performed in [place] hospital lab)  Nasopharyngeal Nasopharyngeal Swab     Status: None   Collection Time: 11/21/20  1:54 PM   Specimen: Nasopharyngeal Swab  Result Value Ref Range Status   SARS Coronavirus 2 NEGATIVE NEGATIVE Final    Comment: (NOTE) SARS-CoV-2 target nucleic acids are NOT DETECTED.  The SARS-CoV-2 RNA is generally detectable in upper and lower respiratory specimens during the acute phase of infection. The lowest concentration of SARS-CoV-2 viral copies this assay can detect is 250 copies / mL. A negative result does not preclude SARS-CoV-2 infection and should not be used as the sole basis for treatment or other patient management decisions.  A negative result may occur with improper specimen collection / handling, submission of specimen other than nasopharyngeal swab, presence of viral mutation(s) within the areas targeted by this assay, and inadequate number of viral copies (<250 copies / mL). A negative result must be combined with clinical observations, patient history, and epidemiological information.  Fact Sheet for Patients:   StrictlyIdeas.no  Fact Sheet for Healthcare Providers: BankingDealers.co.za  This test is not yet approved or  cleared by the [place] FDA and has been authorized for detection and/or diagnosis of SARS-CoV-2 by FDA under an Emergency Use Authorization (EUA).  This EUA will remain in effect (meaning this test can be used) for the duration of the COVID-19 declaration under Section 564(b)(1) of the Act, 21 U.S.C. section 360bbb-3(b)(1), unless the authorization is terminated or revoked sooner.  Performed at [facility], [address] [place], [place] [address]  MRSA PCR Screening     Status: None   Collection Time: 11/21/20 10:08 PM   Specimen: Nasal Mucosa; Nasopharyngeal  Result Value Ref Range Status   MRSA by PCR NEGATIVE NEGATIVE Final    Comment:        The GeneXpert MRSA Assay (FDA approved for NASAL  specimens only), is one component of a comprehensive MRSA colonization surveillance program. It is not intended to diagnose MRSA infection nor to guide or monitor treatment for MRSA infections. Performed at [facility], [address], [place], [place] [address]          Radiology Studies: No results found.      Scheduled Meds: . alfuzosin  10 mg Oral QHS  . Chlorhexidine Gluconate Cloth  6 each Topical Daily  . furosemide  20 mg Oral Daily  . hydrALAZINE  75 mg Oral TID  . losartan  100 mg Oral q morning  . melatonin  6 mg Oral QHS  . mirabegron ER  25 mg Oral Daily  . mometasone-formoterol  2 puff Inhalation BID  . pravastatin  40 mg Oral QHS   Continuous Infusions: . heparin 1,400 Units/hr (11/24/20 0900)  . sodium chloride 3% (hypertonic)       LOS: 3 days    Time spent: 35 minutes    Suriya Kovarik Darleen Crocker, DO [facility]  If 7PM-7AM, please contact night-coverage [email or web address] 11/24/2020, 2:36 PM

## 2020-11-24 NOTE — Progress Notes (Signed)
ANTICOAGULATION CONSULT NOTE -   Pharmacy Consult for heparin gtt  Indication: atrial fibrillation  Allergies  Allergen Reactions  . Prednisone     Pt is high functioning and out of sorts.  . Codeine Other (See Comments)    REACTION: groggy  . Tape Other (See Comments)    Skin tears, use paper tape    Patient Measurements: Height: 5\' 9"  (175.3 cm) Weight: 115.3 kg (254 lb 3.1 oz) IBW/kg (Calculated) : 70.7 HEPARIN DW (KG): 95.5   Vital Signs: Temp: 98.1 F (36.7 C) (02/10 1300) Temp Source: Oral (02/10 1300) BP: 155/44 (02/10 1800) Pulse Rate: 76 (02/10 1800)  Labs: Recent Labs    11/22/20 0546 11/22/20 1636 11/23/20 0532 11/24/20 0601 11/24/20 1900  HGB 11.6*  --  11.6* 11.3*  --   HCT 33.4*  --  33.2* 33.6*  --   PLT 434*  --  443* 443*  --   HEPARINUNFRC 0.62   < > 0.56 0.88* 0.75*  CREATININE 0.82  --  0.87 0.79  --    < > = values in this interval not displayed.    Estimated Creatinine Clearance: 87.6 mL/min (by C-G formula based on SCr of 0.79 mg/dL).   Assessment: Julian Fowler a 86 y.o. male requires anticoagulation with a heparin iv infusion for the indication of  atrial fibrillation. Heparin gtt  started following pharmacy protocol per pharmacy consult. Patient was not on previous oral anti-coagulation.  11/24/20 2000 update Heparin level:  0. 75 IU/mL, above  goal range on heparin at 1400 units/hr CBC:  Hb 11.3      Plates: 443K Note HL was due at 1600 but first sample was not received by lab. Patient refused redraw until around 1900. No issues with infusion or bleeding per RN. Discussed plan with RN   Goal of Therapy:  Heparin level 0.3-0.7 units/ml Monitor platelets by anticoagulation protocol: Yes   Plan:  Hold for 30 min then reduce heparin infusion to 1300 units/hr Daily heparin level and CBC while on heparin Monitor for signs and symptoms of bleeding  Benetta Spar, PharmD, BCPS, BCCP Clinical Pharmacist  Please check AMION for all [facility] phone numbers After 10:00 PM, call [facility] [phone]

## 2020-11-24 NOTE — Progress Notes (Signed)
Patient ID: Julian Fowler, male   DOB: [birth year], 86 y.o.   MRN: [id number] S: Feeling better this morning O:BP (!) 143/72   Pulse 62   Temp (!) 97 F (36.1 C) (Axillary)   Resp 10   Ht 5\' 9"  (1.753 m)   Wt 115.3 kg   SpO2 98%   BMI 37.54 kg/m   Intake/Output Summary (Last 24 hours) at 11/24/2020 1126 Last data filed at 11/24/2020 2979 Gross per 24 hour  Intake 2451.21 ml  Output 2100 ml  Net 351.21 ml   Intake/Output: I/O last 3 completed shifts: In: 3735.8 [I.V.:3735.8] Out: 2800 [Urine:2800]  Intake/Output this shift:  No intake/output data recorded. Weight change:  Gen: NAD CVS: RRR Resp: cta Abd: benign Ext: no edema  Recent Labs  Lab 11/21/20 1247 11/21/20 2110 11/22/20 0546 11/22/20 1658 11/23/20 0532 11/23/20 1119 11/23/20 1451 11/23/20 1705 11/23/20 2023 11/24/20 0013 11/24/20 0601 11/24/20 1037  NA 111*   < > 116*   < > 117* 136 134* 117* 119* 118* 120* 133*  K 4.0  --  3.6  --  4.2  --   --   --   --   --  4.1  --   CL 79*  --  85*  --  88*  --   --   --   --   --  92*  --   CO2 22  --  23  --  23  --   --   --   --   --  24  --   GLUCOSE 100*  --  99  --  103*  --   --   --   --   --  95  --   BUN 17  --  14  --  21  --   --   --   --   --  15  --   CREATININE 1.01  --  0.82  --  0.87  --   --   --   --   --  0.79  --   ALBUMIN 3.0*  --  2.6*  --   --   --   --   --   --   --  2.5*  --   CALCIUM 8.3*  --  8.0*  --  8.1*  --   --   --   --   --  7.9*  --   PHOS  --   --   --   --   --   --   --   --   --   --  3.9  --   AST 33  --  37  --   --   --   --   --   --   --   --   --   ALT 69*  --  76*  --   --   --   --   --   --   --   --   --    < > = values in this interval not displayed.   Liver Function Tests: Recent Labs  Lab 11/21/20 1247 11/22/20 0546 11/24/20 0601  AST 33 37  --   ALT 69* 76*  --   ALKPHOS 62 54  --   BILITOT 0.7 0.6  --   PROT 6.5 5.5*  --   ALBUMIN 3.0* 2.6* 2.5*   Recent Labs  Lab 11/21/20 1247  LIPASE 50    No results for input(s): AMMONIA in the last 168 hours. CBC: Recent Labs  Lab 11/21/20 1247 11/22/20 0546 11/23/20 0532 11/24/20 0601  WBC 6.4 6.0 6.8 6.0  NEUTROABS 4.5  --   --   --   HGB 12.6* 11.6* 11.6* 11.3*  HCT 35.7* 33.4* 33.2* 33.6*  MCV 86.9 87.9 88.3 90.3  PLT 426* 434* 443* 443*   Cardiac Enzymes: No results for input(s): CKTOTAL, CKMB, CKMBINDEX, TROPONINI in the last 168 hours. CBG: No results for input(s): GLUCAP in the last 168 hours.  Iron Studies: No results for input(s): IRON, TIBC, TRANSFERRIN, FERRITIN in the last 72 hours. Studies/Results: No results found. Marland Kitchen alfuzosin  10 mg Oral QHS  . Chlorhexidine Gluconate Cloth  6 each Topical Daily  . furosemide  20 mg Oral Daily  . hydrALAZINE  75 mg Oral TID  . lidocaine  1 patch Transdermal Once  . losartan  100 mg Oral q morning  . melatonin  6 mg Oral QHS  . mirabegron ER  25 mg Oral Daily  . mometasone-formoterol  2 puff Inhalation BID  . pravastatin  40 mg Oral QHS    BMET    Component Value Date/Time   NA 133 (L) 11/24/2020 1037   K 4.1 11/24/2020 0601   CL 92 (L) 11/24/2020 0601   CO2 24 11/24/2020 0601   GLUCOSE 95 11/24/2020 0601   BUN 15 11/24/2020 0601   CREATININE 0.79 11/24/2020 0601   CREATININE 0.78 09/22/2020 1006   CALCIUM 7.9 (L) 11/24/2020 0601   GFRNONAA >60 11/24/2020 0601   GFRAA >60 12/31/2018 0040   CBC    Component Value Date/Time   WBC 6.0 11/24/2020 0601   RBC 3.72 (L) 11/24/2020 0601   HGB 11.3 (L) 11/24/2020 0601   HCT 33.6 (L) 11/24/2020 0601   PLT 443 (H) 11/24/2020 0601   MCV 90.3 11/24/2020 0601   MCH 30.4 11/24/2020 0601   MCHC 33.6 11/24/2020 0601   RDW 13.5 11/24/2020 0601   LYMPHSABS 0.8 11/21/2020 1247   MONOABS 0.8 11/21/2020 1247   EOSABS 0.0 11/21/2020 1247   BASOSABS 0.0 11/21/2020 1247     Assessment/Plan:  1. Acute on chronic hyponatremia- by history had nausea and anorexia the week prior to admission consistent with hypovolemia  although his urine osm and urine sodium more consistent with tea and toast, not pre-renal or SIADH.  His sodium has improved from 111 to 117 this am after receiving IV NS.  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   1. Continue with IV NS for now and repeat sodium level  2. Encourage po intake 3. Furosemide 20 mg started 11/23/20 with a slow improvement of sodium levels to 120 this morning, however repeat at 133 4 hours later.  Need to make sure this is from the right patient (which happened yesterday when sodium was reported at 136).  4. Repeat urine sodium was <10 more in line with pre-renal causes.  Will recheck today. 2. New onset atrial fibrillation- started on heparin and Cardiology following.  But longterm anticoagulation an issue due to recent falls 3. Falls with rib fractures - likely due to weakness from UTI and volume depletion (had week long nausea and anorexia) 4. HTN- stable 5. Hypokalemia- improved.    Donetta Potts, MD [employer] [phone]

## 2020-11-25 ENCOUNTER — Ambulatory Visit: Payer: Medicare Other | Admitting: Urology

## 2020-11-25 DIAGNOSIS — E871 Hypo-osmolality and hyponatremia: Secondary | ICD-10-CM | POA: Diagnosis not present

## 2020-11-25 LAB — CBC
HCT: 33.9 % — ABNORMAL LOW (ref 39.0–52.0)
Hemoglobin: 11.6 g/dL — ABNORMAL LOW (ref 13.0–17.0)
MCH: 30.7 pg (ref 26.0–34.0)
MCHC: 34.2 g/dL (ref 30.0–36.0)
MCV: 89.7 fL (ref 80.0–100.0)
Platelets: 456 10*3/uL — ABNORMAL HIGH (ref 150–400)
RBC: 3.78 MIL/uL — ABNORMAL LOW (ref 4.22–5.81)
RDW: 13.5 % (ref 11.5–15.5)
WBC: 6.2 10*3/uL (ref 4.0–10.5)
nRBC: 0 % (ref 0.0–0.2)

## 2020-11-25 LAB — HEPARIN LEVEL (UNFRACTIONATED)
Heparin Unfractionated: 0.54 IU/mL (ref 0.30–0.70)
Heparin Unfractionated: 0.55 IU/mL (ref 0.30–0.70)

## 2020-11-25 LAB — SODIUM
Sodium: 119 mmol/L — CL (ref 135–145)
Sodium: 122 mmol/L — ABNORMAL LOW (ref 135–145)
Sodium: 123 mmol/L — ABNORMAL LOW (ref 135–145)
Sodium: 122 mmol/L — ABNORMAL LOW (ref 135–145)

## 2020-11-25 NOTE — Progress Notes (Signed)
Started patient on CPAP due to him dropping his sats when he went to sleep per RN. Dr. Arville Go aware. Patient has history of OSA. Placed on autotitrate device per his old settings.

## 2020-11-25 NOTE — Progress Notes (Signed)
    Telemetry reviewed. He remains in atrial fibrillation with rates controlled in the 50's to 60's. Given occasional pauses (longest being 2.6 seconds in the past 24 hours), would not plan to restart AV nodal blocking agents at this time. Can consider a monitor as an outpatient. He is currently on Heparin but by review of the initial consult note, not felt to be a good candidate for long-term anticoagulation given his frequent falls which had led to multiple rib fractures. Was recommended to reassess as an outpatient. Further management of hyponatremia per the admitting team. Will arrange outpatient Cardiology follow-up.   Signed, Erma Heritage, PA-C 11/25/2020, 9:29 AM Pager: [phone]

## 2020-11-25 NOTE — Progress Notes (Signed)
ANTICOAGULATION CONSULT NOTE -   Pharmacy Consult for heparin gtt  Indication: atrial fibrillation  Allergies  Allergen Reactions  . Prednisone     Pt is high functioning and out of sorts.  . Codeine Other (See Comments)    REACTION: groggy  . Tape Other (See Comments)    Skin tears, use paper tape    Patient Measurements: Height: 5\' 9"  (175.3 cm) Weight: 115.3 kg (254 lb 3.1 oz) IBW/kg (Calculated) : 70.7 HEPARIN DW (KG): 95.5   Vital Signs: Temp: 96.8 F (36 C) (02/11 0400) Temp Source: Axillary (02/11 0400) BP: 147/69 (02/11 0400) Pulse Rate: 66 (02/11 0400)  Labs: Recent Labs    11/23/20 0532 11/24/20 0601 11/24/20 1900 11/25/20 0419  HGB 11.6* 11.3*  --  11.6*  HCT 33.2* 33.6*  --  33.9*  PLT 443* 443*  --  456*  HEPARINUNFRC 0.56 0.88* 0.75* 0.54  CREATININE 0.87 0.79  --   --     Estimated Creatinine Clearance: 87.6 mL/min (by C-G formula based on SCr of 0.79 mg/dL).   Assessment: Julian Fowler a 86 y.o. male requires anticoagulation with a heparin iv infusion for the indication of  atrial fibrillation. Heparin gtt  started following pharmacy protocol per pharmacy consult. Patient was not on previous oral anti-coagulation.  HL 0.54- therapeutic  Goal of Therapy:  Heparin level 0.3-0.7 units/ml Monitor platelets by anticoagulation protocol: Yes   Plan:  Continue heparin infusion at 1300 units/hr Check anti-Xa level in 8 hours and daily while on heparin Continue to monitor H&H and platelets  Margot Ables, PharmD Clinical Pharmacist 11/25/2020 7:48 AM

## 2020-11-25 NOTE — Progress Notes (Signed)
ANTICOAGULATION CONSULT NOTE -   Pharmacy Consult for heparin gtt  Indication: atrial fibrillation  Allergies  Allergen Reactions  . Prednisone     Pt is high functioning and out of sorts.  . Codeine Other (See Comments)    REACTION: groggy  . Tape Other (See Comments)    Skin tears, use paper tape    Patient Measurements: Height: 5\' 9"  (175.3 cm) Weight: 115.3 kg (254 lb 3.1 oz) IBW/kg (Calculated) : 70.7 HEPARIN DW (KG): 95.5   Vital Signs: Temp: 96.8 F (36 C) (02/11 0400) Temp Source: Axillary (02/11 0400) BP: 101/40 (02/11 1000) Pulse Rate: 75 (02/11 1400)  Labs: Recent Labs    11/23/20 0532 11/24/20 0601 11/24/20 1900 11/25/20 0419 11/25/20 1342  HGB 11.6* 11.3*  --  11.6*  --   HCT 33.2* 33.6*  --  33.9*  --   PLT 443* 443*  --  456*  --   HEPARINUNFRC 0.56 0.88* 0.75* 0.54 0.55  CREATININE 0.87 0.79  --   --   --     Estimated Creatinine Clearance: 87.6 mL/min (by C-G formula based on SCr of 0.79 mg/dL).   Assessment: Julian Fowler a 86 y.o. male requires anticoagulation with a heparin iv infusion for the indication of  atrial fibrillation. Heparin gtt  started following pharmacy protocol per pharmacy consult. Patient was not on previous oral anti-coagulation.  HL 0.55- therapeutic  Goal of Therapy:  Heparin level 0.3-0.7 units/ml Monitor platelets by anticoagulation protocol: Yes   Plan:  Continue heparin infusion at 1300 units/hr Check anti-Xa level daily while on heparin Continue to monitor H&H and platelets  Margot Ables, PharmD Clinical Pharmacist 11/25/2020 2:34 PM

## 2020-11-25 NOTE — Care Management Important Message (Signed)
Important Message  Patient Details  Name: Julian Fowler MRN: [id number] Date of Birth: 1935/04/02   Medicare Important Message Given:  Yes     Tommy Medal 11/25/2020, 4:20 PM

## 2020-11-25 NOTE — Progress Notes (Signed)
Patient ID: UNO ESAU, male   DOB: 03-Nov-1934, 86 y.o.   MRN: [id number] S: no acute events. Does have occasional pauses on tele therefore no avn blocking agents per cardio. Discussed with wife bedside extensively. He's upset about frequent blood draws (difficult stick). Also has not been eating here as per wife  O:BP (!) 147/69   Pulse 68   Temp (!) 96.8 F (36 C) (Axillary)   Resp 10   Ht 5\' 9"  (1.753 m)   Wt 115.3 kg   SpO2 97%   BMI 37.54 kg/m   Intake/Output Summary (Last 24 hours) at 11/25/2020 1009 Last data filed at 11/25/2020 2440 Gross per 24 hour  Intake 1044.27 ml  Output 800 ml  Net 244.27 ml   Intake/Output: I/O last 3 completed shifts: In: 2781.6 [P.O.:240; I.V.:2541.6] Out: 2125 [Urine:2125]  Intake/Output this shift:  Total I/O In: 74.6 [I.V.:74.6] Out: -  Weight change:    Gen: NAD CVS: RRR Resp: cta bl Abd: ND/NT Ext: no edema Neuro: hard of hearing, awake, alert, speech clear and coherent, moves all ext spontaneously  Recent Labs  Lab 11/21/20 1247 11/21/20 2110 11/22/20 0546 11/22/20 1658 11/23/20 0532 11/23/20 1119 11/24/20 0601 11/24/20 1037 11/24/20 1203 11/24/20 1900 11/24/20 2316 11/25/20 0419 11/25/20 0625  NA 111*   < > 116*   < > 117*   < > 120* QUESTIONABLE RESULTS, RECOMMEND RECOLLECT TO VERIFY 119* 122* 119* 122* 123*  K 4.0  --  3.6  --  4.2  --  4.1  --   --   --   --   --   --   CL 79*  --  85*  --  88*  --  92*  --   --   --   --   --   --   CO2 22  --  23  --  23  --  24  --   --   --   --   --   --   GLUCOSE 100*  --  99  --  103*  --  95  --   --   --   --   --   --   BUN 17  --  14  --  21  --  15  --   --   --   --   --   --   CREATININE 1.01  --  0.82  --  0.87  --  0.79  --   --   --   --   --   --   ALBUMIN 3.0*  --  2.6*  --   --   --  2.5*  --   --   --   --   --   --   CALCIUM 8.3*  --  8.0*  --  8.1*  --  7.9*  --   --   --   --   --   --   PHOS  --   --   --   --   --   --  3.9  --   --   --   --   --    --   AST 33  --  37  --   --   --   --   --   --   --   --   --   --   ALT 69*  --  76*  --   --   --   --   --   --   --   --   --   --    < > =  values in this interval not displayed.   Liver Function Tests: Recent Labs  Lab 11/21/20 1247 11/22/20 0546 11/24/20 0601  AST 33 37  --   ALT 69* 76*  --   ALKPHOS 62 54  --   BILITOT 0.7 0.6  --   PROT 6.5 5.5*  --   ALBUMIN 3.0* 2.6* 2.5*   Recent Labs  Lab 11/21/20 1247  LIPASE 50   No results for input(s): AMMONIA in the last 168 hours. CBC: Recent Labs  Lab 11/21/20 1247 11/22/20 0546 11/23/20 0532 11/24/20 0601 11/25/20 0419  WBC 6.4 6.0 6.8 6.0 6.2  NEUTROABS 4.5  --   --   --   --   HGB 12.6* 11.6* 11.6* 11.3* 11.6*  HCT 35.7* 33.4* 33.2* 33.6* 33.9*  MCV 86.9 87.9 88.3 90.3 89.7  PLT 426* 434* 443* 443* 456*   Cardiac Enzymes: No results for input(s): CKTOTAL, CKMB, CKMBINDEX, TROPONINI in the last 168 hours. CBG: No results for input(s): GLUCAP in the last 168 hours.  Iron Studies: No results for input(s): IRON, TIBC, TRANSFERRIN, FERRITIN in the last 72 hours. Studies/Results: No results found. Marland Kitchen alfuzosin  10 mg Oral QHS  . Chlorhexidine Gluconate Cloth  6 each Topical Daily  . furosemide  20 mg Oral Daily  . losartan  100 mg Oral q morning  . melatonin  6 mg Oral QHS  . mometasone-formoterol  2 puff Inhalation BID  . pravastatin  40 mg Oral QHS    BMET    Component Value Date/Time   NA 123 (L) 11/25/2020 0625   K 4.1 11/24/2020 0601   CL 92 (L) 11/24/2020 0601   CO2 24 11/24/2020 0601   GLUCOSE 95 11/24/2020 0601   BUN 15 11/24/2020 0601   CREATININE 0.79 11/24/2020 0601   CREATININE 0.78 09/22/2020 1006   CALCIUM 7.9 (L) 11/24/2020 0601   GFRNONAA >60 11/24/2020 0601   GFRAA >60 12/31/2018 0040   CBC    Component Value Date/Time   WBC 6.2 11/25/2020 0419   RBC 3.78 (L) 11/25/2020 0419   HGB 11.6 (L) 11/25/2020 0419   HCT 33.9 (L) 11/25/2020 0419   PLT 456 (H) 11/25/2020 0419   MCV  89.7 11/25/2020 0419   MCH 30.7 11/25/2020 0419   MCHC 34.2 11/25/2020 0419   RDW 13.5 11/25/2020 0419   LYMPHSABS 0.8 11/21/2020 1247   MONOABS 0.8 11/21/2020 1247   EOSABS 0.0 11/21/2020 1247   BASOSABS 0.0 11/21/2020 1247     Assessment/Plan:  1. Acute on chronic hyponatremia- by history had nausea and anorexia the week prior to admission consistent with hypovolemia although his urine osm and urine sodium more consistent with tea and toast, not pre-renal or SIADH.  His sodium has improved from 111 to 117 this am after receiving IV NS.  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   1. Encourage po/solute intake 2. Had some Na levels that were 130s however specimen from wrong patient 3. Furosemide 20 mg started 11/23/20 with a slow improvement of sodium levels to 123 4. Low threshold for ure-na given chronicity of hyponatremia, would start 30g BID if needed 5. Decreased blood draws to q8h 2. New onset atrial fibrillation- started on heparin and Cardiology following.  But longterm anticoagulation an issue due to recent falls 3. Falls with rib fractures - likely due to weakness from UTI and volume depletion (had week long nausea and anorexia) 4. HTN- stable 5. Hypokalemia-  improved. replete PRN as this will help with his sodium  Gean Quint, MD [facility]

## 2020-11-25 NOTE — Progress Notes (Signed)
Physical Therapy Treatment Patient Details Name: Julian Fowler MRN: [id number] DOB: October 28, 1934 Today's Date: 11/25/2020    History of Present Illness Julian Fowler  is a 86 y.o. male, with history of obstructive sleep apnea, paroxysmal supraventricular tachycardia, hyperlipidemia, chronic lung disease, GERD, hypertension, BPH, and more presents to the ED with a chief complaint of weakness and falls.  Wife reports that symptoms generally started 10 days ago.  She said he had fever and chills, and then 9 days ago he started have urinary retention and constipation.  Urinary retention was followed by urinary frequency with malodorous urine and dark-colored urine.  7 days ago they went to the PCP and Bactrim was prescribed.  Patient then developed nausea and vomiting which they attributed to Bactrim.  The same day that he went to the PCP he fell in the bathroom and hit the toilet on the way down hurting his left chest.  PCP called him the next day and said that his sodium was mildly low at 131 and he was supposed to get it redrawn today.  3 days ago patient had an acute change with increased generalized weakness decrease in appetite continued nausea and vomiting and another fall that was presyncopal.  Patient is usually able to ambulate with his walker, but wife has been having to hold his walker for him because he has been so weak.  He reports that for the last 2 days he has not been able to walk at all.  3 days ago when he had that acute change they called EMS.  EMS advised that hospitals have long wait times right now because of COVID, so they decided not to get transported.  Patient then became confused today and they decided to come into the ER.    PT Comments    Patient demonstrates increased BLE strength for completing sit to stand and ambulation in hallway without loss of balance.  Patient has chronic excessive external rotation of right ankle when taking steps resulting in decreased step/stride  length, on room air with SpO2 dropping from 96% to 87% while walking, increased to 92% after brief standing rest break before returning to room.  Patient tolerated sitting up in chair after therapy with his spouse in room.  Patient will benefit from continued physical therapy in hospital and recommended venue below to increase strength, balance, endurance for safe ADLs and gait.    Follow Up Recommendations  SNF;Supervision for mobility/OOB;Supervision - Intermittent     Equipment Recommendations  None recommended by PT    Recommendations for Other Services       Precautions / Restrictions Precautions Precautions: Fall Restrictions Weight Bearing Restrictions: No    Mobility  Bed Mobility Overal bed mobility: Needs Assistance Bed Mobility: Supine to Sit     Supine to sit: Min guard     General bed mobility comments: increased time, labored movement    Transfers Overall transfer level: Needs assistance Equipment used: Rolling walker (2 wheeled) Transfers: Sit to/from [facility] Sit to Stand: Min guard Stand pivot transfers: Min guard       General transfer comment: demonstrates increased BLE strength for completing sit to stands  Ambulation/Gait Ambulation/Gait assistance: Min guard Gait Distance (Feet): 90 Feet Assistive device: Rolling walker (2 wheeled) Gait Pattern/deviations: Decreased step length - right;Decreased step length - left;Decreased stride length;Decreased dorsiflexion - right Gait velocity: decreased   General Gait Details: slow slightly labored cadence with excessive RLE external rotation due to ankle deformity, no loss of  balance, on room air with SpO2 dropping from 96% to 87%, returned to 91% when returning to room after brief standing rest break   Stairs             Wheelchair Mobility    Modified Rankin (Stroke Patients Only)       Balance Overall balance assessment: Needs assistance Sitting-balance support:  Feet supported;No upper extremity supported Sitting balance-Leahy Scale: Good Sitting balance - Comments: seated at EOB   Standing balance support: During functional activity;Bilateral upper extremity supported Standing balance-Leahy Scale: Fair Standing balance comment: using RW                            Cognition Arousal/Alertness: Awake/alert Behavior During Therapy: WFL for tasks assessed/performed Overall Cognitive Status: Within Functional Limits for tasks assessed                                        Exercises General Exercises - Lower Extremity Long Arc Quad: Seated;AROM;Strengthening;Both;10 reps Hip Flexion/Marching: Seated;AROM;Strengthening;Both;10 reps Toe Raises: Seated;AROM;Strengthening;10 reps;Both Heel Raises: Seated;AROM;Strengthening;10 reps;Both    General Comments        Pertinent Vitals/Pain Pain Assessment: No/denies pain    Home Living                      Prior Function            PT Goals (current goals can now be found in the care plan section) Acute Rehab PT Goals Patient Stated Goal: return home with family to assist PT Goal Formulation: With patient/family Time For Goal Achievement: 12/06/20 Potential to Achieve Goals: Good Progress towards PT goals: Progressing toward goals    Frequency    Min 3X/week      PT Plan Current plan remains appropriate    Co-evaluation              AM-PAC PT "6 Clicks" Mobility   Outcome Measure  Help needed turning from your back to your side while in a flat bed without using bedrails?: A Little Help needed moving from lying on your back to sitting on the side of a flat bed without using bedrails?: A Little Help needed moving to and from a bed to a chair (including a wheelchair)?: A Little Help needed standing up from a chair using your arms (e.g., wheelchair or bedside chair)?: A Little Help needed to walk in hospital room?: A Little Help needed  climbing 3-5 steps with a railing? : A Lot 6 Click Score: 17    End of Session   Activity Tolerance: Patient tolerated treatment well;Patient limited by fatigue Patient left: in chair;with call bell/phone within reach;with family/visitor present Nurse Communication: Mobility status PT Visit Diagnosis: Unsteadiness on feet (R26.81);Other abnormalities of gait and mobility (R26.89);Muscle weakness (generalized) (M62.81)     Time: 6659-9357 PT Time Calculation (min) (ACUTE ONLY): 23 min  Charges:  $Gait Training: 8-22 mins $Therapeutic Exercise: 8-22 mins                     2:28 PM, 11/25/20 Lonell Grandchild, MPT Physical Therapist with [facility] 336 [phone] office [phone] mobile phone

## 2020-11-25 NOTE — Progress Notes (Signed)
PROGRESS NOTE    Julian Fowler  BSW:[id number] DOB: 08/15/1935 DOA: 11/21/2020 PCP: [name], MD   Brief Narrative:  86 year old male with a history of BPH, paroxysmal SVT, obstructive sleep apnea, admitted to the hospital with generalized weakness, falls. He was found to be significantly hyponatremic. Also found to have rib fractures related to fall. Was also noted to have new onset atrial fibrillation. Being seen by nephrology and cardiology. Serum sodium level slowly improving. A. fib is controlled. Anticipate discharge in the next 1 to 2 days.  Assessment & Plan:   Principal Problem:   Acute hyponatremia Active Problems:   Essential hypertension   New onset a-fib ([place])   Falls   Rib fractures   Acuteon chronichyponatremia-improving -Admitted with weakness, confusion and falls -Sodium of 111 on admission, now around 123 -Patient noted to remain on furosemide and may need urea sodium if not further improving -Overall serum sodium has been trending up -Urine osmolality is low -Appreciateongoingnephrology input -Patient reports poor p.o. intake for the past week due to nausea from recent medications, at the most he is eating some crackers -Appears to have a component of "tea and toast" -May also have some component of SIADHwhich appears to be chronic and related to asbestosis  New onset atrial fibrillation -Currently rate controlled -Appreciate cardiology input -TSH unremarkable -CHA2DS2-VASc 4, but with recent falls, not felt to be an appropriate candidate for anticoagulation at this time. Will need to be readdressed as an outpatient -Will need Holter on discharge -Continues to be rate controlled, but noted to have some pauses for which no further AV nodal blockade recommended, may need Holter monitoring outpatient and will have close cardiology follow-up -On heparin drip  Falls with rib fractures -Secondary to weakness -Seen by PT with  recommendation for skilled nursing facility -Patient and family will likely elect to go home with home health  Hypertension-soft bps currently noted -On losartan and hold hydralazine on 2/10 -Blood pressure stable -Now on Lasix per Nephrology  BPH -Continue alfuzosin   DVT prophylaxis:Heparin drip Code Status:Full code Family Communication:Discussed with wife at the bedside Disposition Plan:Status is: Inpatient  Remains inpatient appropriate because:Persistent severe electrolyte disturbances   Dispo: The patient is from:Home Anticipated d/c is GY:KZLD with home health Anticipated d/c date is: 1-2 days Patient currently is not medically stable to d/c. Difficult to place patient No    Consultants:  Nephrology  Cardiology  Procedures:  See below  Antimicrobials:  None    Subjective: Patient seen and evaluated today with no new acute complaints or concerns. No acute concerns or events noted overnight.  He still is not eating very much.  He is upset about frequent blood draws.  Objective: Vitals:   11/25/20 0700 11/25/20 0951 11/25/20 1000 11/25/20 1012  BP:   (!) 101/40   Pulse: 81 68 71   Resp: 13 10 15    Temp:      TempSrc:      SpO2: 94% 97% 95% 95%  Weight:      Height:        Intake/Output Summary (Last 24 hours) at 11/25/2020 1309 Last data filed at 11/25/2020 3570 Gross per 24 hour  Intake 1044.27 ml  Output 800 ml  Net 244.27 ml   Filed Weights   11/21/20 1104 11/21/20 2200 11/24/20 0457  Weight: 112 kg 110 kg 115.3 kg    Examination:  General exam: Appears calm and comfortable  Respiratory system: Clear to auscultation. Respiratory effort normal. Cardiovascular  system: S1 & S2 heard, irregular.  Gastrointestinal system: Abdomen is soft Central nervous system: Alert and awake Extremities: No edema Skin: No significant lesions noted Psychiatry: Flat  affect.    Data Reviewed: I have personally reviewed following labs and imaging studies  CBC: Recent Labs  Lab 11/21/20 1247 11/22/20 0546 11/23/20 0532 11/24/20 0601 11/25/20 0419  WBC 6.4 6.0 6.8 6.0 6.2  NEUTROABS 4.5  --   --   --   --   HGB 12.6* 11.6* 11.6* 11.3* 11.6*  HCT 35.7* 33.4* 33.2* 33.6* 33.9*  MCV 86.9 87.9 88.3 90.3 89.7  PLT 426* 434* 443* 443* 952*   Basic Metabolic Panel: Recent Labs  Lab 11/21/20 1247 11/21/20 2110 11/22/20 0546 11/22/20 1658 11/23/20 0532 11/23/20 1119 11/24/20 0601 11/24/20 1037 11/24/20 1900 11/24/20 2316 11/25/20 0419 11/25/20 0625 11/25/20 1026  NA 111* 115* 116*   < > 117*   < > 120*   < > 122* 119* 122* 123* 122*  K 4.0  --  3.6  --  4.2  --  4.1  --   --   --   --   --   --   CL 79*  --  85*  --  88*  --  92*  --   --   --   --   --   --   CO2 22  --  23  --  23  --  24  --   --   --   --   --   --   GLUCOSE 100*  --  99  --  103*  --  95  --   --   --   --   --   --   BUN 17  --  14  --  21  --  15  --   --   --   --   --   --   CREATININE 1.01  --  0.82  --  0.87  --  0.79  --   --   --   --   --   --   CALCIUM 8.3*  --  8.0*  --  8.1*  --  7.9*  --   --   --   --   --   --   MG  --  1.7  --   --   --   --   --   --   --   --   --   --   --   PHOS  --   --   --   --   --   --  3.9  --   --   --   --   --   --    < > = values in this interval not displayed.   GFR: Estimated Creatinine Clearance: 87.6 mL/min (by C-G formula based on SCr of 0.79 mg/dL). Liver Function Tests: Recent Labs  Lab 11/21/20 1247 11/22/20 0546 11/24/20 0601  AST 33 37  --   ALT 69* 76*  --   ALKPHOS 62 54  --   BILITOT 0.7 0.6  --   PROT 6.5 5.5*  --   ALBUMIN 3.0* 2.6* 2.5*   Recent Labs  Lab 11/21/20 1247  LIPASE 50   No results for input(s): AMMONIA in the last 168 hours. Coagulation Profile: No results for input(s): INR, PROTIME in the last 168 hours. Cardiac Enzymes: No results for  input(s): CKTOTAL, CKMB, CKMBINDEX,  TROPONINI in the last 168 hours. BNP (last 3 results) No results for input(s): PROBNP in the last 8760 hours. HbA1C: No results for input(s): HGBA1C in the last 72 hours. CBG: No results for input(s): GLUCAP in the last 168 hours. Lipid Profile: No results for input(s): CHOL, HDL, LDLCALC, TRIG, CHOLHDL, LDLDIRECT in the last 72 hours. Thyroid Function Tests: No results for input(s): TSH, T4TOTAL, FREET4, T3FREE, THYROIDAB in the last 72 hours. Anemia Panel: No results for input(s): VITAMINB12, FOLATE, FERRITIN, TIBC, IRON, RETICCTPCT in the last 72 hours. Sepsis Labs: Recent Labs  Lab 11/21/20 1248  LATICACIDVEN 0.6    Recent Results (from the past 240 hour(s))  Urine culture     Status: Abnormal   Collection Time: 11/21/20 12:30 PM   Specimen: Urine, Random  Result Value Ref Range Status   Specimen Description   Final    URINE, RANDOM Performed at [facility], [address], [place], [place] [address]    Special Requests   Final    NONE Performed at [facility], [address], [place], [place] [address]    Culture (A)  Final    <10,000 COLONIES/mL INSIGNIFICANT GROWTH Performed at [facility] [address] [place], [place] [address]    Report Status 11/22/2020 FINAL  Final  Blood culture (routine x 2)     Status: None (Preliminary result)   Collection Time: 11/21/20 12:46 PM   Specimen: BLOOD  Result Value Ref Range Status   Specimen Description BLOOD RIGHT ANTECUBITAL  Final   Special Requests   Final    BOTTLES DRAWN AEROBIC AND ANAEROBIC Blood Culture adequate volume   Culture   Final    NO GROWTH 4 DAYS Performed at [facility], [address] [place], [place] [address]    Report Status PENDING  Incomplete  Blood culture (routine x 2)     Status: None (Preliminary result)   Collection Time: 11/21/20 12:54 PM   Specimen: BLOOD  Result Value Ref Range Status   Specimen Description BLOOD LEFT ANTECUBITAL  Final   Special Requests   Final     BOTTLES DRAWN AEROBIC AND ANAEROBIC Blood Culture adequate volume   Culture   Final    NO GROWTH 4 DAYS Performed at [facility], [address], [place], [place] [address]    Report Status PENDING  Incomplete  SARS Coronavirus 2 by RT PCR (hospital order, performed in [place] hospital lab) Nasopharyngeal Nasopharyngeal Swab     Status: None   Collection Time: 11/21/20  1:54 PM   Specimen: Nasopharyngeal Swab  Result Value Ref Range Status   SARS Coronavirus 2 NEGATIVE NEGATIVE Final    Comment: (NOTE) SARS-CoV-2 target nucleic acids are NOT DETECTED.  The SARS-CoV-2 RNA is generally detectable in upper and lower respiratory specimens during the acute phase of infection. The lowest concentration of SARS-CoV-2 viral copies this assay can detect is 250 copies / mL. A negative result does not preclude SARS-CoV-2 infection and should not be used as the sole basis for treatment or other patient management decisions.  A negative result may occur with improper specimen collection / handling, submission of specimen other than nasopharyngeal swab, presence of viral mutation(s) within the areas targeted by this assay, and inadequate number of viral copies (<250 copies / mL). A negative result must be combined with clinical observations, patient history, and epidemiological information.  Fact Sheet for Patients:   StrictlyIdeas.no  Fact Sheet for Healthcare  Providers: BankingDealers.co.za  This test is not yet approved or  cleared by the [place] and has been authorized for detection and/or diagnosis of SARS-CoV-2 by FDA under an Emergency Use Authorization (EUA).  This EUA will remain in effect (meaning this test can be used) for the duration of the COVID-19 declaration under Section 564(b)(1) of the Act, 21 U.S.C. section 360bbb-3(b)(1), unless the authorization is terminated or revoked sooner.  Performed at [facility], [address], [place], [place] [address]   MRSA PCR Screening     Status: None   Collection Time: 11/21/20 10:08 PM   Specimen: Nasal Mucosa; Nasopharyngeal  Result Value Ref Range Status   MRSA by PCR NEGATIVE NEGATIVE Final    Comment:        The GeneXpert MRSA Assay (FDA approved for NASAL specimens only), is one component of a comprehensive MRSA colonization surveillance program. It is not intended to diagnose MRSA infection nor to guide or monitor treatment for MRSA infections. Performed at [facility], [address], [place], [place] [address]          Radiology Studies: No results found.      Scheduled Meds: . alfuzosin  10 mg Oral QHS  . Chlorhexidine Gluconate Cloth  6 each Topical Daily  . furosemide  20 mg Oral Daily  . losartan  100 mg Oral q morning  . melatonin  6 mg Oral QHS  . mometasone-formoterol  2 puff Inhalation BID  . pravastatin  40 mg Oral QHS   Continuous Infusions: . heparin 1,300 Units/hr (11/25/20 0952)     LOS: 4 days    Time spent: 35 minutes    Dilana Mcphie D Manuella Ghazi, DO [facility]  If 7PM-7AM, please contact night-coverage [email or web address] 11/25/2020, 1:09 PM

## 2020-11-25 NOTE — Progress Notes (Signed)
CRITICAL VALUE ALERT  Critical Value: Sodium= 119  Date & Time Notied:  0018 Nov 25, 2020  Provider Notified: O. Adefeso  Orders Received/Actions taken: Continue to monitor.

## 2020-11-26 DIAGNOSIS — E871 Hypo-osmolality and hyponatremia: Secondary | ICD-10-CM | POA: Diagnosis not present

## 2020-11-26 LAB — BASIC METABOLIC PANEL
Anion gap: 7 (ref 5–15)
BUN: 12 mg/dL (ref 8–23)
CO2: 24 mmol/L (ref 22–32)
Calcium: 8.3 mg/dL — ABNORMAL LOW (ref 8.9–10.3)
Chloride: 92 mmol/L — ABNORMAL LOW (ref 98–111)
Creatinine, Ser: 0.65 mg/dL (ref 0.61–1.24)
GFR, Estimated: >60 mL/min (ref >60)
Glucose, Bld: 95 mg/dL (ref 70–99)
Potassium: 4.1 mmol/L (ref 3.5–5.1)
Sodium: 123 mmol/L — ABNORMAL LOW (ref 135–145)

## 2020-11-26 LAB — CULTURE, BLOOD (ROUTINE X 2)
Culture: NO GROWTH
Culture: NO GROWTH
Special Requests: ADEQUATE
Special Requests: ADEQUATE

## 2020-11-26 LAB — HEPARIN LEVEL (UNFRACTIONATED): Heparin Unfractionated: 0.48 IU/mL (ref 0.30–0.70)

## 2020-11-26 MED ORDER — SODIUM CHLORIDE 1 G PO TABS
1.0000 g | ORAL_TABLET | Freq: Three times a day (TID) | ORAL | Status: DC
  Administered 2020-11-26 – 2020-11-28 (×6): 1 g via ORAL
  Filled 2020-11-26 (×6): qty 1

## 2020-11-26 MED ORDER — HYDRALAZINE HCL 25 MG PO TABS
75.0000 mg | ORAL_TABLET | Freq: Three times a day (TID) | ORAL | Status: DC
  Administered 2020-11-26 – 2020-11-28 (×6): 75 mg via ORAL
  Filled 2020-11-26 (×6): qty 3

## 2020-11-26 NOTE — Progress Notes (Signed)
Patient has declined CPAP this evening and RT spoke with him about it. RT made nursing aware and unit is on standby.

## 2020-11-26 NOTE — Progress Notes (Signed)
ANTICOAGULATION CONSULT NOTE -   Pharmacy Consult for heparin gtt  Indication: atrial fibrillation  Allergies  Allergen Reactions  . Prednisone     Pt is high functioning and out of sorts.  . Codeine Other (See Comments)    REACTION: groggy  . Tape Other (See Comments)    Skin tears, use paper tape    Patient Measurements: Height: 5\' 9"  (175.3 cm) Weight: 115.3 kg (254 lb 3.1 oz) IBW/kg (Calculated) : 70.7 HEPARIN DW (KG): 95.5   Vital Signs: Temp: 98.6 F (37 C) (02/12 0500) Temp Source: Oral (02/12 0500) BP: 171/78 (02/12 0600) Pulse Rate: 77 (02/12 0700)  Labs: Recent Labs    11/24/20 0601 11/24/20 1900 11/25/20 0419 11/25/20 1342 11/26/20 0456  HGB 11.3*  --  11.6*  --   --   HCT 33.6*  --  33.9*  --   --   PLT 443*  --  456*  --   --   HEPARINUNFRC 0.88*   < > 0.54 0.55 0.48  CREATININE 0.79  --   --   --  0.65   < > = values in this interval not displayed.    Estimated Creatinine Clearance: 87.6 mL/min (by C-G formula based on SCr of 0.65 mg/dL).   Assessment: Julian Fowler a 86 y.o. male requires anticoagulation with a heparin iv infusion for the indication of  atrial fibrillation. Heparin gtt  started following pharmacy protocol per pharmacy consult. Patient was not on previous oral anti-coagulation.  HL 0.48- therapeutic  Goal of Therapy:  Heparin level 0.3-0.7 units/ml Monitor platelets by anticoagulation protocol: Yes   Plan:  Continue heparin infusion at 1300 units/hr Check anti-Xa level daily while on heparin Continue to monitor H&H and platelets  Margot Ables, PharmD Clinical Pharmacist 11/26/2020 8:03 AM

## 2020-11-26 NOTE — Progress Notes (Signed)
Patient ambulated around unit twice with nurse. Pt tolerated movement very well. All vital signs stayed within normal limits. Patient on room air. Pt and family eager to continue physical activity working towards discharge.

## 2020-11-26 NOTE — Progress Notes (Signed)
PROGRESS NOTE    Julian Fowler  IRC:[id number] DOB: 1935-06-26 DOA: 11/21/2020 PCP: [name], MD   Brief Narrative:  86 year old male with a history of BPH, paroxysmal SVT, obstructive sleep apnea, admitted to the hospital with generalized weakness, falls. He was found to be significantly hyponatremic. Also found to have rib fractures related to fall. Was also noted to have new onset atrial fibrillation. Being seen by nephrology and cardiology. Serum sodium level slowly improving. A. fib is controlled. Anticipate discharge in the next 1 to 2 days.  Assessment & Plan:   Principal Problem:   Acute hyponatremia Active Problems:   Essential hypertension   New onset a-fib ([place])   Falls   Rib fractures   Acuteon chronichyponatremia-improving/stable -Admitted with weakness, confusion and falls -Sodium of 111 on admission, now around 123; plan for dc once Na>126 per Nephrology -Start NaCl 1g TID per Nephrology -Patient noted to remain on furosemide and may need urea sodium if not further improving -Overall serum sodium has been trending up -Urine osmolality is low -Appreciateongoingnephrology input -Patient reports poor p.o. 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 Will need to be readdressed as an outpatient -Will need Holter on discharge -Continues to be rate controlled, but noted to have some pauses for which no further AV nodal blockade recommended, may need Holter monitoring outpatient and will have close  cardiology follow-up -On heparin drip while inpatient  Falls with rib fractures -Secondary to weakness -Seen by PT with recommendation for skilled nursing facility -Patient and family will likely elect to go home with home health  Hypertension-soft bps currently noted -On losartan andresumehydralazineon 2/12 -Blood pressure stable -Now on Lasix per Nephrology  BPH -Continue alfuzosin   DVT prophylaxis:Heparin drip Code Status:Full code Family Communication:Discussed with wife at the bedside Disposition Plan:Status is: Inpatient  Remains inpatient appropriate because:Persistent severe electrolyte disturbances   Dispo: The patient is from:Home Anticipated d/c is PZ:[facility] home health Anticipated d/c date is:1-2 days Patient currently is not medically stable to d/c. Difficult to place patient No    Consultants:  Nephrology  Cardiology  Procedures:  See below  Antimicrobials:  None   Subjective: Patient seen and evaluated today with no new acute complaints or concerns. No acute concerns or events noted overnight. Still with poor oral intake.   Objective: Vitals:   11/26/20 0800 11/26/20 0900 11/26/20 1000 11/26/20 1100  BP: (!) 158/77  129/67   Pulse: 83 70 68 72  Resp: 14 14 14 10   Temp:      TempSrc:      SpO2: 97% 98% 96% 97%  Weight:      Height:        Intake/Output Summary (Last 24 hours) at 11/26/2020 1134 Last data filed at 11/26/2020 0400 Gross per 24 hour  Intake 232.37 ml  Output --  Net 232.37 ml   Filed Weights   11/21/20 1104 11/21/20 2200 11/24/20 0457  Weight: 112 kg 110 kg 115.3 kg    Examination:  General exam: Appears calm and comfortable  Respiratory system: Clear to auscultation.  Respiratory effort normal.1-2 [place]. Cardiovascular system: S1 & S2 heard, RRR.  Gastrointestinal system: Abdomen is soft Central nervous system: Alert and  awake Extremities: No edema Skin: No significant lesions noted Psychiatry: Flat affect.    Data Reviewed: I have personally reviewed following labs and imaging studies  CBC: Recent Labs  Lab 11/21/20 1247 11/22/20 0546 11/23/20 0532 11/24/20 0601 11/25/20 0419  WBC 6.4 6.0 6.8 6.0 6.2  NEUTROABS 4.5  --   --   --   --   HGB 12.6* 11.6* 11.6* 11.3* 11.6*  HCT 35.7* 33.4* 33.2* 33.6* 33.9*  MCV 86.9 87.9 88.3 90.3 89.7  PLT 426* 434* 443* 443* 419*   Basic Metabolic Panel: Recent Labs  Lab 11/21/20 1247 11/21/20 2110 11/22/20 0546 11/22/20 1658 11/23/20 0532 11/23/20 1119 11/24/20 0601 11/24/20 1037 11/24/20 2316 11/25/20 0419 11/25/20 0625 11/25/20 1026 11/26/20 0456  NA 111* 115* 116*   < > 117*   < > 120*   < > 119* 122* 123* 122* 123*  K 4.0  --  3.6  --  4.2  --  4.1  --   --   --   --   --  4.1  CL 79*  --  85*  --  88*  --  92*  --   --   --   --   --  92*  CO2 22  --  23  --  23  --  24  --   --   --   --   --  24  GLUCOSE 100*  --  99  --  103*  --  95  --   --   --   --   --  95  BUN 17  --  14  --  21  --  15  --   --   --   --   --  12  CREATININE 1.01  --  0.82  --  0.87  --  0.79  --   --   --   --   --  0.65  CALCIUM 8.3*  --  8.0*  --  8.1*  --  7.9*  --   --   --   --   --  8.3*  MG  --  1.7  --   --   --   --   --   --   --   --   --   --   --   PHOS  --   --   --   --   --   --  3.9  --   --   --   --   --   --    < > = values in this interval not displayed.   GFR: Estimated Creatinine Clearance: 87.6 mL/min (by C-G formula based on SCr of 0.65 mg/dL). Liver Function Tests: Recent Labs  Lab 11/21/20 1247 11/22/20 0546 11/24/20 0601  AST 33 37  --   ALT 69* 76*  --   ALKPHOS 62 54  --   BILITOT 0.7 0.6  --   PROT 6.5 5.5*  --   ALBUMIN 3.0* 2.6* 2.5*   Recent Labs  Lab 11/21/20 1247  LIPASE 50   No results for input(s): AMMONIA in the last 168 hours. Coagulation Profile: No results for input(s): INR, PROTIME in the last 168  hours. Cardiac Enzymes: No results for input(s): CKTOTAL, CKMB, CKMBINDEX, TROPONINI in the last  168 hours. BNP (last 3 results) No results for input(s): PROBNP in the last 8760 hours. HbA1C: No results for input(s): HGBA1C in the last 72 hours. CBG: No results for input(s): GLUCAP in the last 168 hours. Lipid Profile: No results for input(s): CHOL, HDL, LDLCALC, TRIG, CHOLHDL, LDLDIRECT in the last 72 hours. Thyroid Function Tests: No results for input(s): TSH, T4TOTAL, FREET4, T3FREE, THYROIDAB in the last 72 hours. Anemia Panel: No results for input(s): VITAMINB12, FOLATE, FERRITIN, TIBC, IRON, RETICCTPCT in the last 72 hours. Sepsis Labs: Recent Labs  Lab 11/21/20 1248  LATICACIDVEN 0.6    Recent Results (from the past 240 hour(s))  Urine culture     Status: Abnormal   Collection Time: 11/21/20 12:30 PM   Specimen: Urine, Random  Result Value Ref Range Status   Specimen Description   Final    URINE, RANDOM Performed at [facility], [address] [place], [place] [address]    Special Requests   Final    NONE Performed at [facility], [address], [place], [place] [address]    Culture (A)  Final    <10,000 COLONIES/mL INSIGNIFICANT GROWTH Performed at [facility] [address] [place], [place] [address]    Report Status 11/22/2020 FINAL  Final  Blood culture (routine x 2)     Status: None   Collection Time: 11/21/20 12:46 PM   Specimen: BLOOD  Result Value Ref Range Status   Specimen Description BLOOD RIGHT ANTECUBITAL  Final   Special Requests   Final    BOTTLES DRAWN AEROBIC AND ANAEROBIC Blood Culture adequate volume   Culture   Final    NO GROWTH 5 DAYS Performed at [facility], [address], [place], [place] [address]    Report Status 11/26/2020 FINAL  Final  Blood culture (routine x 2)     Status: None   Collection Time: 11/21/20 12:54 PM   Specimen: BLOOD  Result Value Ref Range Status   Specimen Description BLOOD LEFT ANTECUBITAL   Final   Special Requests   Final    BOTTLES DRAWN AEROBIC AND ANAEROBIC Blood Culture adequate volume   Culture   Final    NO GROWTH 5 DAYS Performed at [facility], [address], [place], [place] [address]    Report Status 11/26/2020 FINAL  Final  SARS Coronavirus 2 by RT PCR (hospital order, performed in [facility] hospital lab) Nasopharyngeal Nasopharyngeal Swab     Status: None   Collection Time: 11/21/20  1:54 PM   Specimen: Nasopharyngeal Swab  Result Value Ref Range Status   SARS Coronavirus 2 NEGATIVE NEGATIVE Final    Comment: (NOTE) SARS-CoV-2 target nucleic acids are NOT DETECTED.  The SARS-CoV-2 RNA is generally detectable in upper and lower respiratory specimens during the acute phase of infection. The lowest concentration of SARS-CoV-2 viral copies this assay can detect is 250 copies / mL. A negative result does not preclude SARS-CoV-2 infection and should not be used as the sole basis for treatment or other patient management decisions.  A negative result may occur with improper specimen collection / handling, submission of specimen other than nasopharyngeal swab, presence of viral mutation(s) within the areas targeted by this assay, and inadequate number of viral copies (<250 copies / mL). A negative result must be combined with clinical observations, patient history, and epidemiological information.  Fact Sheet for Patients:   StrictlyIdeas.no  Fact Sheet for Healthcare Providers: BankingDealers.co.za  This test is not yet approved or  cleared by the [place] and has been authorized for detection and/or diagnosis of SARS-CoV-2 by FDA under an Emergency Use Authorization (EUA).  This EUA will remain in effect (meaning this test can be used) for the duration of the COVID-19 declaration under Section 564(b)(1) of the Act, 21 U.S.C. section 360bbb-3(b)(1), unless the authorization is terminated or revoked  sooner.  Performed at [facility], [address], [place], [place] [address]   MRSA PCR Screening     Status: None   Collection Time: 11/21/20 10:08 PM   Specimen: Nasal Mucosa; Nasopharyngeal  Result Value Ref Range Status   MRSA by PCR NEGATIVE NEGATIVE Final    Comment:        The GeneXpert MRSA Assay (FDA approved for NASAL specimens only), is one component of a comprehensive MRSA colonization surveillance program. It is not intended to diagnose MRSA infection nor to guide or monitor treatment for MRSA infections. Performed at [facility], [address], [place], [place] [address]          Radiology Studies: No results found.      Scheduled Meds: . alfuzosin  10 mg Oral QHS  . Chlorhexidine Gluconate Cloth  6 each Topical Daily  . furosemide  20 mg Oral Daily  . losartan  100 mg Oral q morning  . melatonin  6 mg Oral QHS  . mometasone-formoterol  2 puff Inhalation BID  . pravastatin  40 mg Oral QHS  . sodium chloride  1 g Oral TID WC   Continuous Infusions: . heparin 1,300 Units/hr (11/25/20 1613)     LOS: 5 days    Time spent: 35 minutes    Luisa Louk D Manuella Ghazi, DO [facility]  If 7PM-7AM, please contact night-coverage [email or web address] 11/26/2020, 11:34 AM

## 2020-11-27 DIAGNOSIS — E871 Hypo-osmolality and hyponatremia: Secondary | ICD-10-CM | POA: Diagnosis not present

## 2020-11-27 LAB — BASIC METABOLIC PANEL
Anion gap: 9 (ref 5–15)
BUN: 11 mg/dL (ref 8–23)
CO2: 25 mmol/L (ref 22–32)
Calcium: 8.7 mg/dL — ABNORMAL LOW (ref 8.9–10.3)
Chloride: 90 mmol/L — ABNORMAL LOW (ref 98–111)
Creatinine, Ser: 0.66 mg/dL (ref 0.61–1.24)
GFR, Estimated: >60 mL/min (ref >60)
Glucose, Bld: 102 mg/dL — ABNORMAL HIGH (ref 70–99)
Potassium: 4 mmol/L (ref 3.5–5.1)
Sodium: 124 mmol/L — ABNORMAL LOW (ref 135–145)

## 2020-11-27 LAB — HEPARIN LEVEL (UNFRACTIONATED): Heparin Unfractionated: 0.41 IU/mL (ref 0.30–0.70)

## 2020-11-27 NOTE — Progress Notes (Signed)
ANTICOAGULATION CONSULT NOTE -   Pharmacy Consult for heparin gtt  Indication: atrial fibrillation  Allergies  Allergen Reactions  . Prednisone     Pt is high functioning and out of sorts.  . Codeine Other (See Comments)    REACTION: groggy  . Tape Other (See Comments)    Skin tears, use paper tape    Patient Measurements: Height: 5\' 9"  (175.3 cm) Weight: 115.3 kg (254 lb 3.1 oz) IBW/kg (Calculated) : 70.7 HEPARIN DW (KG): 95.5   Vital Signs:    Labs: Recent Labs    11/25/20 0419 11/25/20 1342 11/26/20 0456 11/27/20 0304  HGB 11.6*  --   --   --   HCT 33.9*  --   --   --   PLT 456*  --   --   --   HEPARINUNFRC 0.54 0.55 0.48 0.41  CREATININE  --   --  0.65 0.66    Estimated Creatinine Clearance: 87.6 mL/min (by C-G formula based on SCr of 0.66 mg/dL).   Assessment: Julian Fowler a 86 y.o. male requires anticoagulation with a heparin iv infusion for the indication of  atrial fibrillation. Heparin gtt  started following pharmacy protocol per pharmacy consult. Patient was not on previous oral anti-coagulation.  HL 0.41- therapeutic  Goal of Therapy:  Heparin level 0.3-0.7 units/ml Monitor platelets by anticoagulation protocol: Yes   Plan:  Continue heparin infusion at 1300 units/hr Check anti-Xa level daily while on heparin Continue to monitor H&H and platelets  Margot Ables, PharmD Clinical Pharmacist 11/27/2020 8:09 AM

## 2020-11-27 NOTE — Progress Notes (Signed)
PROGRESS NOTE    Julian Fowler  GUY:[id number] DOB: 11/18/1934 DOA: 11/21/2020 PCP: [name], MD   Brief Narrative:  86 year old male with a history of BPH, paroxysmal SVT, obstructive sleep apnea, admitted to the hospital with generalized weakness, falls. He was found to be significantly hyponatremic. Also found to have rib fractures related to fall. Was also noted to have new onset atrial fibrillation. Being seen by nephrology and cardiology. Serum sodium level slowly improving. A. fib is controlled. Anticipate discharge in the next 1 to 2 days.  Assessment & Plan:   Principal Problem:   Acute hyponatremia Active Problems:   Essential hypertension   New onset a-fib ([place])   Falls   Rib fractures   Acuteon chronichyponatremia-improving/stable -Admitted with weakness, confusion and falls -Sodium of 111 on admission, now around 124; plan for dc once Na>126 per Nephrology -Start NaCl 1g TID per Nephrology -Patient noted to remain on furosemide and may need urea sodium if not further improving -Overall serum sodium has been trending up -Urine osmolality is low -Appreciateongoingnephrology input -Patient reports poor p.o. 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 Will need to be readdressed as an outpatient -Will need Holter on discharge -Continues to be rate controlled, but noted to have some pauses for which no further AV nodal blockade recommended,may need Holter monitoring outpatient and will have close  cardiology follow-up -On heparin drip while inpatient  Falls with rib fractures -Secondary to weakness -Seen by PT with recommendation for skilled nursing facility -Patient and family will likely elect to go home with home health  Hypertension-soft bps currently noted -On losartan andresumehydralazineon 2/12 -Blood pressure stable -Now on Lasix per Nephrology  BPH -Continue alfuzosin   DVT prophylaxis:Heparin drip while inpatient Code Status:Full code Family Communication:Discussed with wife at the bedside Disposition Plan:Status is: Inpatient  Remains inpatient appropriate because:Persistent severe electrolyte disturbances   Dispo: The patient is from:Home Anticipated d/c is DG:[facility] home health Anticipated d/c date is:1-2 days Patient currently is not medically stable to d/c. Difficult to place patient No    Consultants:  Nephrology  Cardiology  Procedures:  See below  Antimicrobials:  None  Subjective: Patient seen and evaluated today with no new acute complaints or concerns. No acute concerns or events noted overnight. He is overall feeling better today.  Objective: Vitals:   11/27/20 0816 11/27/20 0900 11/27/20 0931 11/27/20 1141  BP:      Pulse: 95 85  77  Resp: 14 13  (!) 21  Temp: (!) 97.5 F (36.4 C)   (!) 97.2 F (36.2 C)  TempSrc: Oral   Oral  SpO2: 98% 97% 95% 97%  Weight:      Height:        Intake/Output Summary (Last 24 hours) at 11/27/2020 1234 Last data filed at 11/27/2020 0655 Gross per 24 hour  Intake 143.67 ml  Output 3200 ml  Net -3056.33 ml   Filed Weights   11/21/20 1104 11/21/20 2200 11/24/20 0457  Weight: 112 kg 110 kg 115.3 kg    Examination:  General exam: Appears  calm and comfortable  Respiratory system: Clear to auscultation. Respiratory effort normal. Cardiovascular system: S1 & S2 heard, irregular.  Gastrointestinal system:  Abdomen is soft Central nervous system: Alert and awake Extremities: No edema Skin: No significant lesions noted Psychiatry: Flat affect.    Data Reviewed: I have personally reviewed following labs and imaging studies  CBC: Recent Labs  Lab 11/21/20 1247 11/22/20 0546 11/23/20 0532 11/24/20 0601 11/25/20 0419  WBC 6.4 6.0 6.8 6.0 6.2  NEUTROABS 4.5  --   --   --   --   HGB 12.6* 11.6* 11.6* 11.3* 11.6*  HCT 35.7* 33.4* 33.2* 33.6* 33.9*  MCV 86.9 87.9 88.3 90.3 89.7  PLT 426* 434* 443* 443* 678*   Basic Metabolic Panel: Recent Labs  Lab 11/21/20 2110 11/22/20 0546 11/22/20 1658 11/23/20 0532 11/23/20 1119 11/24/20 0601 11/24/20 1037 11/25/20 0419 11/25/20 0625 11/25/20 1026 11/26/20 0456 11/27/20 0304  NA 115* 116*   < > 117*   < > 120*   < > 122* 123* 122* 123* 124*  K  --  3.6  --  4.2  --  4.1  --   --   --   --  4.1 4.0  CL  --  85*  --  88*  --  92*  --   --   --   --  92* 90*  CO2  --  23  --  23  --  24  --   --   --   --  24 25  GLUCOSE  --  99  --  103*  --  95  --   --   --   --  95 102*  BUN  --  14  --  21  --  15  --   --   --   --  12 11  CREATININE  --  0.82  --  0.87  --  0.79  --   --   --   --  0.65 0.66  CALCIUM  --  8.0*  --  8.1*  --  7.9*  --   --   --   --  8.3* 8.7*  MG 1.7  --   --   --   --   --   --   --   --   --   --   --   PHOS  --   --   --   --   --  3.9  --   --   --   --   --   --    < > = values in this interval not displayed.   GFR: Estimated Creatinine Clearance: 87.6 mL/min (by C-G formula based on SCr of 0.66 mg/dL). Liver Function Tests: Recent Labs  Lab 11/21/20 1247 11/22/20 0546 11/24/20 0601  AST 33 37  --   ALT 69* 76*  --   ALKPHOS 62 54  --   BILITOT 0.7 0.6  --   PROT 6.5 5.5*  --   ALBUMIN 3.0* 2.6* 2.5*   Recent Labs  Lab 11/21/20 1247  LIPASE 50   No results for input(s): AMMONIA in the last 168 hours. Coagulation Profile: No results for input(s): INR, PROTIME in the last 168 hours. Cardiac  Enzymes: No results for input(s): CKTOTAL, CKMB, CKMBINDEX, TROPONINI in the last 168 hours. BNP (last 3 results) No results for input(s): PROBNP in the last 8760 hours. HbA1C: No results for input(s): HGBA1C  in the last 72 hours. CBG: No results for input(s): GLUCAP in the last 168 hours. Lipid Profile: No results for input(s): CHOL, HDL, LDLCALC, TRIG, CHOLHDL, LDLDIRECT in the last 72 hours. Thyroid Function Tests: No results for input(s): TSH, T4TOTAL, FREET4, T3FREE, THYROIDAB in the last 72 hours. Anemia Panel: No results for input(s): VITAMINB12, FOLATE, FERRITIN, TIBC, IRON, RETICCTPCT in the last 72 hours. Sepsis Labs: Recent Labs  Lab 11/21/20 1248  LATICACIDVEN 0.6    Recent Results (from the past 240 hour(s))  Urine culture     Status: Abnormal   Collection Time: 11/21/20 12:30 PM   Specimen: Urine, Random  Result Value Ref Range Status   Specimen Description   Final    URINE, RANDOM Performed at [facility], [address] [place], [place] [address]    Special Requests   Final    NONE Performed at [facility], [address] [place], [place] [address]    Culture (A)  Final    <10,000 COLONIES/mL INSIGNIFICANT GROWTH Performed at [facility] [address], [place], [place] [address]    Report Status 11/22/2020 FINAL  Final  Blood culture (routine x 2)     Status: None   Collection Time: 11/21/20 12:46 PM   Specimen: BLOOD  Result Value Ref Range Status   Specimen Description BLOOD RIGHT ANTECUBITAL  Final   Special Requests   Final    BOTTLES DRAWN AEROBIC AND ANAEROBIC Blood Culture adequate volume   Culture   Final    NO GROWTH 5 DAYS Performed at [facility], [address] [place], [place] [address]    Report Status 11/26/2020 FINAL  Final  Blood culture (routine x 2)     Status: None   Collection Time: 11/21/20 12:54 PM   Specimen: BLOOD  Result Value Ref Range Status   Specimen Description BLOOD LEFT ANTECUBITAL  Final   Special  Requests   Final    BOTTLES DRAWN AEROBIC AND ANAEROBIC Blood Culture adequate volume   Culture   Final    NO GROWTH 5 DAYS Performed at [facility], [address] [place], [place] [address]    Report Status 11/26/2020 FINAL  Final  SARS Coronavirus 2 by RT PCR (hospital order, performed in [facility] hospital lab) Nasopharyngeal Nasopharyngeal Swab     Status: None   Collection Time: 11/21/20  1:54 PM   Specimen: Nasopharyngeal Swab  Result Value Ref Range Status   SARS Coronavirus 2 NEGATIVE NEGATIVE Final    Comment: (NOTE) SARS-CoV-2 target nucleic acids are NOT DETECTED.  The SARS-CoV-2 RNA is generally detectable in upper and lower respiratory specimens during the acute phase of infection. The lowest concentration of SARS-CoV-2 viral copies this assay can detect is 250 copies / mL. A negative result does not preclude SARS-CoV-2 infection and should not be used as the sole basis for treatment or other patient management decisions.  A negative result may occur with improper specimen collection / handling, submission of specimen other than nasopharyngeal swab, presence of viral mutation(s) within the areas targeted by this assay, and inadequate number of viral copies (<250 copies / mL). A negative result must be combined with clinical observations, patient history, and epidemiological information.  Fact Sheet for Patients:   StrictlyIdeas.no  Fact Sheet for Healthcare Providers: BankingDealers.co.za  This test is not yet approved or  cleared by the [place] FDA and has been authorized for detection and/or diagnosis of SARS-CoV-2 by FDA under an Emergency Use  Authorization (EUA).  This EUA will remain in effect (meaning this test can be used) for the duration of the COVID-19 declaration under Section 564(b)(1) of the Act, 21 U.S.C. section 360bbb-3(b)(1), unless the authorization is terminated or revoked  sooner.  Performed at [facility], [address], [place], [place] [address]   MRSA PCR Screening     Status: None   Collection Time: 11/21/20 10:08 PM   Specimen: Nasal Mucosa; Nasopharyngeal  Result Value Ref Range Status   MRSA by PCR NEGATIVE NEGATIVE Final    Comment:        The GeneXpert MRSA Assay (FDA approved for NASAL specimens only), is one component of a comprehensive MRSA colonization surveillance program. It is not intended to diagnose MRSA infection nor to guide or monitor treatment for MRSA infections. Performed at [facility], [address], [place], [place] [address]          Radiology Studies: No results found.      Scheduled Meds: . alfuzosin  10 mg Oral QHS  . Chlorhexidine Gluconate Cloth  6 each Topical Daily  . furosemide  20 mg Oral Daily  . hydrALAZINE  75 mg Oral TID  . losartan  100 mg Oral q morning  . melatonin  6 mg Oral QHS  . mometasone-formoterol  2 puff Inhalation BID  . pravastatin  40 mg Oral QHS  . sodium chloride  1 g Oral TID WC   Continuous Infusions: . heparin 1,300 Units/hr (11/27/20 0956)     LOS: 6 days    Time spent: 35 minutes    Landen Breeland D Manuella Ghazi, DO [facility]  If 7PM-7AM, please contact night-coverage [email or web address] 11/27/2020, 12:34 PM

## 2020-11-28 ENCOUNTER — Inpatient Hospital Stay: Payer: Medicare Other

## 2020-11-28 DIAGNOSIS — E871 Hypo-osmolality and hyponatremia: Secondary | ICD-10-CM | POA: Diagnosis not present

## 2020-11-28 LAB — CBC
HCT: 32.9 % — ABNORMAL LOW (ref 39.0–52.0)
Hemoglobin: 11.3 g/dL — ABNORMAL LOW (ref 13.0–17.0)
MCH: 31.1 pg (ref 26.0–34.0)
MCHC: 34.3 g/dL (ref 30.0–36.0)
MCV: 90.6 fL (ref 80.0–100.0)
Platelets: 450 10*3/uL — ABNORMAL HIGH (ref 150–400)
RBC: 3.63 MIL/uL — ABNORMAL LOW (ref 4.22–5.81)
RDW: 13.8 % (ref 11.5–15.5)
WBC: 7.1 10*3/uL (ref 4.0–10.5)
nRBC: 0 % (ref 0.0–0.2)

## 2020-11-28 LAB — BASIC METABOLIC PANEL
Anion gap: 9 (ref 5–15)
BUN: 10 mg/dL (ref 8–23)
CO2: 25 mmol/L (ref 22–32)
Calcium: 8.7 mg/dL — ABNORMAL LOW (ref 8.9–10.3)
Chloride: 92 mmol/L — ABNORMAL LOW (ref 98–111)
Creatinine, Ser: 0.68 mg/dL (ref 0.61–1.24)
GFR, Estimated: >60 mL/min (ref >60)
Glucose, Bld: 92 mg/dL (ref 70–99)
Potassium: 3.9 mmol/L (ref 3.5–5.1)
Sodium: 126 mmol/L — ABNORMAL LOW (ref 135–145)

## 2020-11-28 LAB — HEPARIN LEVEL (UNFRACTIONATED): Heparin Unfractionated: 0.44 IU/mL (ref 0.30–0.70)

## 2020-11-28 LAB — MAGNESIUM: Magnesium: 1.8 mg/dL (ref 1.7–2.4)

## 2020-11-28 MED ORDER — FUROSEMIDE 20 MG PO TABS: 20.0000 mg | ORAL_TABLET | Freq: Every day | ORAL | 3 refills | Status: DC

## 2020-11-28 MED ORDER — SODIUM CHLORIDE 1 G PO TABS: 1.0000 g | ORAL_TABLET | Freq: Three times a day (TID) | ORAL | 2 refills | Status: AC

## 2020-11-28 NOTE — Discharge Summary (Signed)
Physician Discharge Summary  Julian Fowler IRS:[id number] DOB: Feb 14, 1935 DOA: 11/21/2020  PCP: [name], MD  Admit date: 11/21/2020  Discharge date: 11/28/2020  Admitted From:Home  Disposition:  Home  Recommendations for Outpatient Follow-up:  1. Follow up with PCP in 1-2 weeks 2. Follow-up BMP in 5-7 days 3. Follow-up with nephrology as requested in the next 2-3 weeks 4. Remain on furosemide 20 mg daily as well as potassium supplementation as prescribed 5. Continue on sodium chloride 3 times daily 6. Continue 2 L fluid restriction at home 7. Holter monitoring and further follow up per Cardiology  Home Health: Yes with RN and PT  Equipment/Devices: None  Discharge Condition:Stable  CODE STATUS: Full  Diet recommendation: Heart Healthy  Brief/Interim Summary: 86 year old male with a history of BPH, paroxysmal SVT, obstructive sleep apnea, admitted to the hospital with generalized weakness, falls. He was found to be significantly hyponatremic. Also found to have rib fractures related to fall. Was also noted to have new onset atrial fibrillation.  Patient was noted to have acute on chronic hyponatremia with sodium level now of 126.  He feels much better and back to his usual baseline without any further weakness, confusion, or nausea.  He has had improved appetite and oral intake.  He will remain on sodium chloride 1 g 3 times daily as well as furosemide as prescribed.  His atrial fibrillation has remained rate controlled on telemetry and he has been seen by cardiology and noted to be a high risk for anticoagulation, therefore he will not remain on any further anticoagulation on discharge.  He did remain on heparin drip during his stay.  Holter monitoring will be arranged as appropriate per Cardiology recommendations.  No other acute events noted throughout the course of his stay.  He is stable for discharge.  Discharge Diagnoses:  Principal Problem:   Acute  hyponatremia Active Problems:   Essential hypertension   New onset a-fib ([place])   Falls   Rib fractures  Principal discharge diagnosis: Acute on chronic hyponatremia along with new onset atrial fibrillation.  Discharge Instructions  Discharge Instructions    Ambulatory referral to Nephrology   Complete by: As directed    Diet - low sodium heart healthy   Complete by: As directed    Increase activity slowly   Complete by: As directed      Allergies as of 11/28/2020      Reactions   Prednisone    Pt is high functioning and out of sorts.   Codeine Other (See Comments)   REACTION: groggy   Tape Other (See Comments)   Skin tears, use paper tape      Medication List    STOP taking these medications   sulfamethoxazole-trimethoprim 800-160 MG tablet Commonly known as: BACTRIM DS     TAKE these medications   acetaminophen 325 MG tablet Commonly known as: TYLENOL Take 325 mg by mouth as needed.   alfuzosin 10 MG 24 hr tablet Commonly known as: UROXATRAL Take 1 tablet (10 mg total) by mouth at bedtime.   azelastine 0.1 % nasal spray Commonly known as: ASTELIN 1-2 puffs each nostril every 8 hours if needed for drainage What changed:   when to take this  reasons to take this   Benefiber Powd Take 1 packet by mouth 2 (two) times daily.   budesonide-formoterol 160-4.5 MCG/ACT inhaler Commonly known as: Symbicort Inhale 2 puffs then rinse mouth twice daily   CENTRUM SILVER PO Take 1 tablet by mouth  daily.   diclofenac Sodium 1 % Gel Commonly known as: VOLTAREN Apply 2 g topically daily as needed (For pain).   diltiazem 120 MG 24 hr capsule Commonly known as: CARDIZEM CD Take 1 capsule by mouth twice daily   esomeprazole 40 MG capsule Commonly known as: NEXIUM Take 80 mg by mouth 2 (two) times daily before a meal.   fexofenadine 180 MG tablet Commonly known as: ALLEGRA Take 180 mg by mouth daily as needed for allergies.   fluticasone 50 MCG/ACT nasal  spray Commonly known as: FLONASE Place 2 sprays into both nostrils daily. What changed:   when to take this  reasons to take this   furosemide 20 MG tablet Commonly known as: LASIX Take 1 tablet (20 mg total) by mouth daily. What changed:   when to take this  reasons to take this   hydrALAZINE 50 MG tablet Commonly known as: APRESOLINE Take 1.5 tablets (75 mg total) by mouth 3 (three) times daily.   latanoprost 0.005 % ophthalmic solution Commonly known as: XALATAN Place 1 drop into both eyes at bedtime.   LORazepam 1 MG tablet Commonly known as: ATIVAN Take 1 tablet by mouth three times daily as needed What changed: reasons to take this   losartan 100 MG tablet Commonly known as: COZAAR Take 100 mg by mouth every morning.   Os-Cal Ultra 600 MG Tabs Take 1 tablet by mouth daily.   polyethylene glycol 17 g packet Commonly known as: MIRALAX / GLYCOLAX Take 17 g by mouth at bedtime.   potassium chloride SA 20 MEQ tablet Commonly known as: KLOR-CON Take 1 tablet (20 mEq total) by mouth 2 (two) times daily.   pravastatin 40 MG tablet Commonly known as: PRAVACHOL Take 1 tablet by mouth at bedtime.   ProAir HFA 108 (90 Base) MCG/ACT inhaler Generic drug: albuterol INHALE 2 PUFFS BY MOUTH EVERY 6 HOURS AS NEEDED FOR WHEEZING FOR SHORTNESS OF BREATH What changed: See the new instructions.   sodium chloride 1 g tablet Take 1 tablet (1 g total) by mouth 3 (three) times daily with meals.   SYSTANE OP Place 1 drop into both eyes daily.   Zinc 25 MG Tabs Take 1 tablet by mouth daily.   zolpidem 10 MG tablet Commonly known as: AMBIEN Take 1 tablet by mouth at bedtime.       Follow-up Information    [facility].   Specialty: Home Health Services Why:  RN/PT will call you to set up first home appointment.       Isaiah Serge, NP Follow up on 12/15/2020.   Specialties: Cardiology, Radiology Why: Cardiology Hospital Follow-up  on 12/15/2020 at 2:00. Please call the office if needing a different date or time.  Contact information: [address] [place] [address] [phone]        [name], MD. Schedule an appointment as soon as possible for a visit in 1 week(s).   Specialty: Family Medicine Contact information: [place] [address] [phone]        Donato Heinz, MD. Schedule an appointment as soon as possible for a visit in 4 week(s).   Specialty: Nephrology Contact information: [address] [place]  [address] [phone]              Allergies  Allergen Reactions  . Prednisone     Pt is high functioning and out of sorts.  . Codeine Other (See Comments)  REACTION: groggy  . Tape Other (See Comments)    Skin tears, use paper tape    Consultations:  Nephrology  Cardiology   Procedures/Studies: DG Pelvis 1-2 Views  Result Date: 11/21/2020 CLINICAL DATA:  Pelvic tenderness EXAM: PELVIS - 1-2 VIEW COMPARISON:  None. FINDINGS: There is no evidence of pelvic fracture or diastasis. No pelvic bone lesions are seen. Vascular calcifications noted IMPRESSION: No acute osseous abnormality. Electronically Signed   By: Suzy Bouchard M.D.   On: 11/21/2020 13:43   CT Head Wo Contrast  Result Date: 11/21/2020 CLINICAL DATA:  Being treated for UTI, increased weakness and falling. EXAM: CT HEAD WITHOUT CONTRAST CT CERVICAL SPINE WITHOUT CONTRAST TECHNIQUE: Multidetector CT imaging of the head and cervical spine was performed following the standard protocol without intravenous contrast. Multiplanar CT image reconstructions of the cervical spine were also generated. COMPARISON:  CT head and cervical spine October 23, 2018. FINDINGS: CT HEAD FINDINGS Brain: No evidence of acute infarction, hemorrhage, hydrocephalus, extra-axial collection or mass lesion/mass effect. Similar global parenchymal volume loss with ex vacuo dilatation of ventricular system. Mild burden of  chronic small vessel white matter disease. Vascular: No hyperdense vessel. Vascular calcifications. 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 Skull: Normal. Negative for fracture or focal lesion. Sinuses/Orbits: No acute finding. Other: None CT CERVICAL SPINE FINDINGS Alignment: Unchanged moderate kyphosis at C5. 3 mm anterolisthesis of C3 on C4, unchanged. Skull base and vertebrae: Negative for fracture Soft tissues and spinal canal: No prevertebral fluid or swelling. No visible canal hematoma. Atherosclerotic calcifications of the carotid arteries bilaterally. Disc levels: Multilevel disc and facet degeneration. Similar cervical spondylosis and spinal stenosis at C4-C5, C5-C6 and C6-C7. Upper chest: Negative Other: None IMPRESSION: 1. No acute intracranial pathology. Similar atrophy and chronic microvascular ischemic changes noted. 2. No acute fracture or static subluxation of the cervical spine. Similar cervical spondylosis as above. Electronically Signed   By: Dahlia Bailiff MD   On: 11/21/2020 17:48   CT Cervical Spine Wo Contrast  Result Date: 11/21/2020 CLINICAL DATA:  Being treated for UTI, increased weakness and falling. EXAM: CT HEAD WITHOUT CONTRAST CT CERVICAL SPINE WITHOUT CONTRAST TECHNIQUE: Multidetector CT imaging of the head and cervical spine was performed following the standard protocol without intravenous contrast. Multiplanar CT image reconstructions of the cervical spine were also generated. COMPARISON:  CT head and cervical spine October 23, 2018. FINDINGS: CT HEAD FINDINGS Brain: No evidence of acute infarction, hemorrhage, hydrocephalus, extra-axial collection or mass lesion/mass effect. Similar global parenchymal volume loss with ex vacuo dilatation of ventricular system. Mild burden of chronic small vessel white matter disease. Vascular: No hyperdense vessel. Vascular calcifications. 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 Skull: Normal. Negative  for fracture or focal lesion. Sinuses/Orbits: No acute finding. Other: None CT CERVICAL SPINE FINDINGS Alignment: Unchanged moderate kyphosis at C5. 3 mm anterolisthesis of C3 on C4, unchanged. Skull base and vertebrae: Negative for fracture Soft tissues and spinal canal: No prevertebral fluid or swelling. No visible canal hematoma. Atherosclerotic calcifications of the carotid arteries bilaterally. Disc levels: Multilevel disc and facet degeneration. Similar cervical spondylosis and spinal stenosis at C4-C5, C5-C6 and C6-C7. Upper chest: Negative Other: None IMPRESSION: 1. No acute intracranial pathology. Similar atrophy and chronic microvascular ischemic changes noted. 2. No acute fracture or static subluxation of the cervical spine. Similar cervical spondylosis as above. Electronically Signed   By: Dahlia Bailiff MD   On: 11/21/2020 17:48   CT CHEST ABDOMEN PELVIS W CONTRAST  Result Date: 11/21/2020 CLINICAL DATA:  86 year old male with multiple falls and abdominal trauma. EXAM: CT CHEST, ABDOMEN, AND PELVIS WITH CONTRAST TECHNIQUE: Multidetector CT imaging of the chest, abdomen and pelvis was performed following the standard protocol during bolus administration of intravenous contrast. CONTRAST:  167mL OMNIPAQUE IOHEXOL 300 MG/ML  SOLN COMPARISON:  CT abdomen pelvis dated 10/05/2020. FINDINGS: CT CHEST FINDINGS Cardiovascular: Top-normal cardiac size. No pericardial effusion. Three-vessel coronary vascular calcification. Advanced atherosclerotic calcification of the thoracic aorta. No aneurysmal dilatation or dissection. The origins of the great vessels of the aortic arch appear patent. The central pulmonary arteries are grossly unremarkable. Mediastinum/Nodes: No hilar or mediastinal adenopathy. The esophagus and the thyroid gland are grossly unremarkable. No mediastinal fluid collection. Lungs/Pleura: Bilateral calcified pleural plaques, likely sequela of prior asbestos exposure. No focal consolidation,  pleural effusion, or pneumothorax. The central airways are patent. Musculoskeletal: Osteopenia with degenerative changes of the spine. Several nondisplaced left anterior rib fractures involving the third, fourth, fifth and, likely sixth ribs. CT ABDOMEN PELVIS FINDINGS No intra-abdominal free air or free fluid. Hepatobiliary: Subcentimeter left hepatic hypodense lesion is too small to characterize. The liver is otherwise unremarkable. No intrahepatic biliary ductal dilatation. Cholecystectomy. Pancreas: Unremarkable. No pancreatic ductal dilatation or surrounding inflammatory changes. Spleen: Normal in size without focal abnormality. Adrenals/Urinary Tract: The adrenal glands unremarkable. There is no hydronephrosis on either side. There is symmetric enhancement and excretion of contrast by both kidneys. The visualized ureters and urinary bladder appear unremarkable. Stomach/Bowel: Small scattered sigmoid diverticula without active inflammatory changes. There is no bowel obstruction or active inflammation. The appendix is normal. Vascular/Lymphatic: Advanced aortoiliac atherosclerotic disease. The IVC is unremarkable. No portal venous gas. There is no adenopathy. Reproductive: The prostate and seminal vesicles are grossly unremarkable. No pelvic masses Other: Small fat containing bilateral inguinal hernias. No fluid collection. Musculoskeletal: Osteopenia with degenerative changes of the spine. No acute osseous pathology. IMPRESSION: 1. Several nondisplaced left anterior rib fractures. No pneumothorax. 2. No acute/traumatic intra-abdominal or pelvic pathology. 3. Aortic Atherosclerosis (ICD10-I70.0). Electronically Signed   By: Anner Crete M.D.   On: 11/21/2020 17:47   DG Chest Portable 1 View  Result Date: 11/21/2020 CLINICAL DATA:  Multiple falls. EXAM: PORTABLE CHEST 1 VIEW COMPARISON:  Chest x-ray 06/24/2020 FINDINGS: Mild cardiac enlargement. Moderate tortuosity and calcification of the thoracic aorta.  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 There is moderate eventration of the right hemidiaphragm. No infiltrates, edema or effusions. No pneumothorax. No acute bony findings.  No obvious acute rib fractures. IMPRESSION: 1. Mild cardiac enlargement. 2. 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 3. No acute cardiopulmonary findings. 4. Grossly intact bony thorax. Electronically Signed   By: Marijo Sanes M.D.   On: 11/21/2020 13:38   ECHOCARDIOGRAM COMPLETE  Result Date: 11/22/2020    ECHOCARDIOGRAM REPORT   Patient Name:   GARVIN ELLENA Date of Exam: 11/22/2020 Medical Rec #:  [id number]       Height:       69.0 in Accession #:    [id number]      Weight:       242.5 lb Date of Birth:  19-Jan-1935       BSA:          2.242 m Patient Age:    56 years        BP:           100/55 mmHg Patient Gender: M  HR:           44 bpm. Exam Location:  [facility] Procedure: 2D Echo Indications:    Atrial Fibrillation I48.91  History:        Patient has prior history of Echocardiogram examinations, most                 recent 09/06/2020. Arrythmias:Atrial Fibrillation,                 Signs/Symptoms:Chest Pain, Syncope and Murmur; Risk                 Factors:Hypertension, Dyslipidemia and Former Smoker.  Sonographer:    Leavy Cella RDCS (AE) Referring Phys: [id number] ASIA B [place]  1. Left ventricular ejection fraction, by estimation, is 60 to 65%. The left ventricle has normal function. The left ventricle has no regional wall motion abnormalities. There is moderate left ventricular hypertrophy. Left ventricular diastolic parameters are indeterminate.  2. Right ventricular systolic function is normal. The right ventricular size is normal.  3. The mitral valve is normal in structure. No evidence of mitral valve regurgitation. No evidence of mitral stenosis.  4. The aortic valve is tricuspid. There  is moderate calcification of the aortic valve. There is moderate thickening of the aortic valve. Aortic valve regurgitation is not visualized. Mild aortic valve stenosis. Aortic valve mean gradient measures 10.3 mmHg. Aortic valve peak gradient measures 18.8 mmHg. Aortic valve area, by VTI measures 1.78 cm.  5. The inferior vena cava is normal in size with greater than 50% respiratory variability, suggesting right atrial pressure of 3 mmHg. FINDINGS  Left Ventricle: Left ventricular ejection fraction, by estimation, is 60 to 65%. The left ventricle has normal function. The left ventricle has no regional wall motion abnormalities. The left ventricular internal cavity size was normal in size. There is  moderate left ventricular hypertrophy. Left ventricular diastolic function could not be evaluated due to atrial fibrillation. Left ventricular diastolic parameters are indeterminate. Right Ventricle: The right ventricular size is normal. No increase in right ventricular wall thickness. Right ventricular systolic function is normal. Left Atrium: Left atrial size was normal in size. Right Atrium: Right atrial size was normal in size. Pericardium: There is no evidence of pericardial effusion. Mitral Valve: The mitral valve is normal in structure. There is mild thickening of the mitral valve leaflet(s). There is mild calcification of the mitral valve leaflet(s). Mild to moderate mitral annular calcification. No evidence of mitral valve regurgitation. No evidence of mitral valve stenosis. Tricuspid Valve: The tricuspid valve is normal in structure. Tricuspid valve regurgitation is trivial. No evidence of tricuspid stenosis. Aortic Valve: The aortic valve is tricuspid. There is moderate calcification of the aortic valve. There is moderate thickening of the aortic valve. There is moderate aortic valve annular calcification. Aortic valve regurgitation is not visualized. Mild aortic stenosis is present. Aortic valve mean  gradient measures 10.3 mmHg. Aortic valve peak gradient measures 18.8 mmHg. Aortic valve area, by VTI measures 1.78 cm. Pulmonic Valve: The pulmonic valve was not well visualized. Pulmonic valve regurgitation is not visualized. No evidence of pulmonic stenosis. Aorta: The aortic root is normal in size and structure. Pulmonary Artery: Indeterminant PASP, inadequate TR jet. Venous: The inferior vena cava is normal in size with greater than 50% respiratory variability, suggesting right atrial pressure of 3 mmHg. IAS/Shunts: No atrial level shunt detected by color flow Doppler.  LEFT VENTRICLE PLAX 2D LVIDd:         3.46 cm  Diastology LVIDs:         2.28 cm  LV e' medial:    0.10 cm/s LV PW:         1.40 cm  LV E/e' medial:  8.7 LV IVS:        1.40 cm  LV e' lateral:   0.10 cm/s LVOT diam:     2.00 cm  LV E/e' lateral: 9.3 LV SV:         69 LV SV Index:   31 LVOT Area:     3.14 cm  RIGHT VENTRICLE RV S prime:     10.40 cm/s LEFT ATRIUM           Index       RIGHT ATRIUM           Index LA diam:      4.90 cm 2.19 cm/m  RA Area:     19.40 cm LA Vol (A2C): 26.0 ml 11.60 ml/m RA Volume:   44.20 ml  19.71 ml/m LA Vol (A4C): 49.7 ml 22.16 ml/m  AORTIC VALVE AV Area (Vmax):    1.66 cm AV Area (Vmean):   1.83 cm AV Area (VTI):     1.78 cm AV Vmax:           217.00 cm/s AV Vmean:          149.667 cm/s AV VTI:            0.387 m AV Peak Grad:      18.8 mmHg AV Mean Grad:      10.3 mmHg LVOT Vmax:         115.00 cm/s LVOT Vmean:        87.200 cm/s LVOT VTI:          0.220 m LVOT/AV VTI ratio: 0.57  AORTA Ao Root diam: 2.90 cm MITRAL VALVE MV Area (PHT): 3.19 cm   SHUNTS MV Decel Time: 238 msec   Systemic VTI:  0.22 m MV E velocity: 0.90 cm/s  Systemic Diam: 2.00 cm Carlyle Dolly MD Electronically signed by Carlyle Dolly MD Signature Date/Time: 11/22/2020/10:59:48 AM    Final       Discharge Exam: Vitals:   11/28/20 0524 11/28/20 0731  BP: 136/81   Pulse: 71   Resp: 16   Temp: 98.5 F (36.9 C)   SpO2: 95%  96%   Vitals:   11/27/20 1949 11/28/20 0104 11/28/20 0524 11/28/20 0731  BP: (!) 146/103 139/70 136/81   Pulse: 68 74 71   Resp: 18 18 16    Temp: 98.7 F (37.1 C) 98 F (36.7 C) 98.5 F (36.9 C)   TempSrc: Oral Oral Oral   SpO2: 97% 96% 95% 96%  Weight:      Height:        General: Pt is alert, awake, not in acute distress Cardiovascular: Irregular, S1/S2 +, no rubs, no gallops Respiratory: CTA bilaterally, no wheezing, no rhonchi Abdominal: Soft, NT, ND, bowel sounds + Extremities: no edema, no cyanosis    The results of significant diagnostics from this hospitalization (including imaging, microbiology, ancillary and laboratory) are listed below for reference.     Microbiology: Recent Results (from the past 240 hour(s))  Urine culture     Status: Abnormal   Collection Time: 11/21/20 12:30 PM   Specimen: Urine, Random  Result Value Ref Range Status   Specimen Description   Final    URINE, RANDOM Performed at [facility], [address], [place], [place]  [address]    Special Requests   Final    NONE Performed at [facility], [address], [place], [place] [address]    Culture (A)  Final    <10,000 COLONIES/mL INSIGNIFICANT GROWTH Performed at [facility] [address] [place], [place] [address]    Report Status 11/22/2020 FINAL  Final  Blood culture (routine x 2)     Status: None   Collection Time: 11/21/20 12:46 PM   Specimen: BLOOD  Result Value Ref Range Status   Specimen Description BLOOD RIGHT ANTECUBITAL  Final   Special Requests   Final    BOTTLES DRAWN AEROBIC AND ANAEROBIC Blood Culture adequate volume   Culture   Final    NO GROWTH 5 DAYS Performed at [facility], [address] [place], [place] [address]    Report Status 11/26/2020 FINAL  Final  Blood culture (routine x 2)     Status: None   Collection Time: 11/21/20 12:54 PM   Specimen: BLOOD  Result Value Ref Range Status   Specimen Description BLOOD LEFT ANTECUBITAL  Final    Special Requests   Final    BOTTLES DRAWN AEROBIC AND ANAEROBIC Blood Culture adequate volume   Culture   Final    NO GROWTH 5 DAYS Performed at [facility], [address] [place], [place] [address]    Report Status 11/26/2020 FINAL  Final  SARS Coronavirus 2 by RT PCR (hospital order, performed in [facility] hospital lab) Nasopharyngeal Nasopharyngeal Swab     Status: None   Collection Time: 11/21/20  1:54 PM   Specimen: Nasopharyngeal Swab  Result Value Ref Range Status   SARS Coronavirus 2 NEGATIVE NEGATIVE Final    Comment: (NOTE) SARS-CoV-2 target nucleic acids are NOT DETECTED.  The SARS-CoV-2 RNA is generally detectable in upper and lower respiratory specimens during the acute phase of infection. The lowest concentration of SARS-CoV-2 viral copies this assay can detect is 250 copies / mL. A negative result does not preclude SARS-CoV-2 infection and should not be used as the sole basis for treatment or other patient management decisions.  A negative result may occur with improper specimen collection / handling, submission of specimen other than nasopharyngeal swab, presence of viral mutation(s) within the areas targeted by this assay, and inadequate number of viral copies (<250 copies / mL). A negative result must be combined with clinical observations, patient history, and epidemiological information.  Fact Sheet for Patients:   StrictlyIdeas.no  Fact Sheet for Healthcare Providers: BankingDealers.co.za  This test is not yet approved or  cleared by the [place] FDA and has been authorized for detection and/or diagnosis of SARS-CoV-2 by FDA under an Emergency Use Authorization (EUA).  This EUA will remain in effect (meaning this test can be used) for the duration of the COVID-19 declaration under Section 564(b)(1) of the Act, 21 U.S.C. section 360bbb-3(b)(1), unless the authorization is terminated or revoked  sooner.  Performed at [facility], [address] [place], [place] [address]   MRSA PCR Screening     Status: None   Collection Time: 11/21/20 10:08 PM   Specimen: Nasal Mucosa; Nasopharyngeal  Result Value Ref Range Status   MRSA by PCR NEGATIVE NEGATIVE Final    Comment:        The GeneXpert MRSA Assay (FDA approved for NASAL specimens only), is one component of a comprehensive MRSA colonization surveillance program. It is not intended to diagnose MRSA infection nor to guide or monitor treatment for  MRSA infections. Performed at [facility], [address] [place], [place] [address]      Labs: BNP (last 3 results) No results for input(s): BNP in the last 8760 hours. Basic Metabolic Panel: Recent Labs  Lab 11/21/20 2110 11/22/20 0546 11/23/20 0532 11/23/20 1119 11/24/20 0601 11/24/20 1037 11/25/20 0625 11/25/20 1026 11/26/20 0456 11/27/20 0304 11/28/20 0539  NA 115*   < > 117*   < > 120*   < > 123* 122* 123* 124* 126*  K  --    < > 4.2  --  4.1  --   --   --  4.1 4.0 3.9  CL  --    < > 88*  --  92*  --   --   --  92* 90* 92*  CO2  --    < > 23  --  24  --   --   --  24 25 25   GLUCOSE  --    < > 103*  --  95  --   --   --  95 102* 92  BUN  --    < > 21  --  15  --   --   --  12 11 10   CREATININE  --    < > 0.87  --  0.79  --   --   --  0.65 0.66 0.68  CALCIUM  --    < > 8.1*  --  7.9*  --   --   --  8.3* 8.7* 8.7*  MG 1.7  --   --   --   --   --   --   --   --   --  1.8  PHOS  --   --   --   --  3.9  --   --   --   --   --   --    < > = values in this interval not displayed.   Liver Function Tests: Recent Labs  Lab 11/21/20 1247 11/22/20 0546 11/24/20 0601  AST 33 37  --   ALT 69* 76*  --   ALKPHOS 62 54  --   BILITOT 0.7 0.6  --   PROT 6.5 5.5*  --   ALBUMIN 3.0* 2.6* 2.5*   Recent Labs  Lab 11/21/20 1247  LIPASE 50   No results for input(s): AMMONIA in the last 168 hours. CBC: Recent Labs  Lab 11/21/20 1247 11/22/20 0546 11/23/20 0532  11/24/20 0601 11/25/20 0419 11/28/20 0539  WBC 6.4 6.0 6.8 6.0 6.2 7.1  NEUTROABS 4.5  --   --   --   --   --   HGB 12.6* 11.6* 11.6* 11.3* 11.6* 11.3*  HCT 35.7* 33.4* 33.2* 33.6* 33.9* 32.9*  MCV 86.9 87.9 88.3 90.3 89.7 90.6  PLT 426* 434* 443* 443* 456* 450*   Cardiac Enzymes: No results for input(s): CKTOTAL, CKMB, CKMBINDEX, TROPONINI in the last 168 hours. BNP: Invalid input(s): POCBNP CBG: No results for input(s): GLUCAP in the last 168 hours. D-Dimer No results for input(s): DDIMER in the last 72 hours. Hgb A1c No results for input(s): HGBA1C in the last 72 hours. Lipid Profile No results for input(s): CHOL, HDL, LDLCALC, TRIG, CHOLHDL, LDLDIRECT in the last 72 hours. Thyroid function studies No results for input(s): TSH, T4TOTAL, T3FREE, THYROIDAB in the last 72 hours.  Invalid input(s): FREET3 Anemia work up No results for input(s): VITAMINB12, FOLATE, FERRITIN, TIBC, IRON, RETICCTPCT  in the last 72 hours. Urinalysis    Component Value Date/Time   COLORURINE STRAW (A) 11/21/2020 1230   APPEARANCEUR CLEAR 11/21/2020 1230   APPEARANCEUR Clear 09/05/2020 1509   LABSPEC 1.005 11/21/2020 1230   PHURINE 7.0 11/21/2020 1230   GLUCOSEU NEGATIVE 11/21/2020 1230   HGBUR NEGATIVE 11/21/2020 1230   HGBUR negative 10/27/2008 0838   BILIRUBINUR NEGATIVE 11/21/2020 1230   BILIRUBINUR Negative 09/05/2020 1509   KETONESUR NEGATIVE 11/21/2020 1230   PROTEINUR NEGATIVE 11/21/2020 1230   UROBILINOGEN 0.2 01/11/2014 1405   NITRITE NEGATIVE 11/21/2020 1230   LEUKOCYTESUR NEGATIVE 11/21/2020 1230   Sepsis Labs Invalid input(s): PROCALCITONIN,  WBC,  LACTICIDVEN Microbiology Recent Results (from the past 240 hour(s))  Urine culture     Status: Abnormal   Collection Time: 11/21/20 12:30 PM   Specimen: Urine, Random  Result Value Ref Range Status   Specimen Description   Final    URINE, RANDOM Performed at [facility], [address], [place], [place] [address]    Special  Requests   Final    NONE Performed at [facility], [address] [place], [place] [address]    Culture (A)  Final    <10,000 COLONIES/mL INSIGNIFICANT GROWTH Performed at [facility], [place] [address], [place], [place] [address]    Report Status 11/22/2020 FINAL  Final  Blood culture (routine x 2)     Status: None   Collection Time: 11/21/20 12:46 PM   Specimen: BLOOD  Result Value Ref Range Status   Specimen Description BLOOD RIGHT ANTECUBITAL  Final   Special Requests   Final    BOTTLES DRAWN AEROBIC AND ANAEROBIC Blood Culture adequate volume   Culture   Final    NO GROWTH 5 DAYS Performed at [facility], [address], [place], [place] [address]    Report Status 11/26/2020 FINAL  Final  Blood culture (routine x 2)     Status: None   Collection Time: 11/21/20 12:54 PM   Specimen: BLOOD  Result Value Ref Range Status   Specimen Description BLOOD LEFT ANTECUBITAL  Final   Special Requests   Final    BOTTLES DRAWN AEROBIC AND ANAEROBIC Blood Culture adequate volume   Culture   Final    NO GROWTH 5 DAYS Performed at [facility], [address], [place], [place] [address]    Report Status 11/26/2020 FINAL  Final  SARS Coronavirus 2 by RT PCR (hospital order, performed in [facility] hospital lab) Nasopharyngeal Nasopharyngeal Swab     Status: None   Collection Time: 11/21/20  1:54 PM   Specimen: Nasopharyngeal Swab  Result Value Ref Range Status   SARS Coronavirus 2 NEGATIVE NEGATIVE Final    Comment: (NOTE) SARS-CoV-2 target nucleic acids are NOT DETECTED.  The SARS-CoV-2 RNA is generally detectable in upper and lower respiratory specimens during the acute phase of infection. The lowest concentration of SARS-CoV-2 viral copies this assay can detect is 250 copies / mL. A negative result does not preclude SARS-CoV-2 infection and should not be used as the sole basis for treatment or other patient management decisions.  A negative result may occur with improper  specimen collection / handling, submission of specimen other than nasopharyngeal swab, presence of viral mutation(s) within the areas targeted by this assay, and inadequate number of viral copies (<250 copies / mL). A negative result must be combined with clinical observations, patient history, and epidemiological information.  Fact Sheet for Patients:   StrictlyIdeas.no  Fact Sheet  for Healthcare Providers: BankingDealers.co.za  This test is not yet approved or  cleared by the [place] and has been authorized for detection and/or diagnosis of SARS-CoV-2 by FDA under an Emergency Use Authorization (EUA).  This EUA will remain in effect (meaning this test can be used) for the duration of the COVID-19 declaration under Section 564(b)(1) of the Act, 21 U.S.C. section 360bbb-3(b)(1), unless the authorization is terminated or revoked sooner.  Performed at [facility], [address] [place], [place] [address]   MRSA PCR Screening     Status: None   Collection Time: 11/21/20 10:08 PM   Specimen: Nasal Mucosa; Nasopharyngeal  Result Value Ref Range Status   MRSA by PCR NEGATIVE NEGATIVE Final    Comment:        The GeneXpert MRSA Assay (FDA approved for NASAL specimens only), is one component of a comprehensive MRSA colonization surveillance program. It is not intended to diagnose MRSA infection nor to guide or monitor treatment for MRSA infections. Performed at [facility], [address] [place], [place] [address]      Time coordinating discharge: 35 minutes  SIGNED:   Rodena Goldmann, DO [facility] 11/28/2020, 9:18 AM  If 7PM-7AM, please contact night-coverage [email or web address]

## 2020-11-28 NOTE — Progress Notes (Signed)
Patient ID: Julian Fowler, male   DOB: Feb 16, 1935, 86 y.o.   MRN: [id number] S: Feels well, no complaints.   O:BP 136/81 (BP Location: Right Arm)   Pulse 71   Temp 98.5 F (36.9 C) (Oral)   Resp 16   Ht 5\' 9"  (1.753 m)   Wt 115.3 kg   SpO2 96%   BMI 37.54 kg/m  No intake or output data in the 24 hours ending 11/28/20 0907 Intake/Output: I/O last 3 completed shifts: In: -  Out: 1800 [Urine:1800]  Intake/Output this shift:  No intake/output data recorded. Weight change:  Gen: NAD CVS: RRR Resp: cta Abd: benign Ext: no edema  Recent Labs  Lab 11/21/20 1247 11/21/20 2110 11/22/20 0546 11/22/20 1658 11/23/20 0532 11/23/20 1119 11/24/20 0601 11/24/20 1037 11/24/20 2316 11/25/20 0419 11/25/20 0625 11/25/20 1026 11/26/20 0456 11/27/20 0304 11/28/20 0539  NA 111*   < > 116*   < > 117*   < > 120*   < > 119* 122* 123* 122* 123* 124* 126*  K 4.0  --  3.6  --  4.2  --  4.1  --   --   --   --   --  4.1 4.0 3.9  CL 79*  --  85*  --  88*  --  92*  --   --   --   --   --  92* 90* 92*  CO2 22  --  23  --  23  --  24  --   --   --   --   --  24 25 25   GLUCOSE 100*  --  99  --  103*  --  95  --   --   --   --   --  95 102* 92  BUN 17  --  14  --  21  --  15  --   --   --   --   --  12 11 10   CREATININE 1.01  --  0.82  --  0.87  --  0.79  --   --   --   --   --  0.65 0.66 0.68  ALBUMIN 3.0*  --  2.6*  --   --   --  2.5*  --   --   --   --   --   --   --   --   CALCIUM 8.3*  --  8.0*  --  8.1*  --  7.9*  --   --   --   --   --  8.3* 8.7* 8.7*  PHOS  --   --   --   --   --   --  3.9  --   --   --   --   --   --   --   --   AST 33  --  37  --   --   --   --   --   --   --   --   --   --   --   --   ALT 69*  --  76*  --   --   --   --   --   --   --   --   --   --   --   --    < > = values in this interval not displayed.   Liver Function Tests: Recent Labs  Lab 11/21/20 1247 11/22/20 0546 11/24/20 0601  AST 33 37  --   ALT 69* 76*  --   ALKPHOS 62 54  --   BILITOT 0.7 0.6  --    PROT 6.5 5.5*  --   ALBUMIN 3.0* 2.6* 2.5*   Recent Labs  Lab 11/21/20 1247  LIPASE 50   No results for input(s): AMMONIA in the last 168 hours. CBC: Recent Labs  Lab 11/21/20 1247 11/22/20 0546 11/23/20 0532 11/24/20 0601 11/25/20 0419 11/28/20 0539  WBC 6.4 6.0 6.8 6.0 6.2 7.1  NEUTROABS 4.5  --   --   --   --   --   HGB 12.6* 11.6* 11.6* 11.3* 11.6* 11.3*  HCT 35.7* 33.4* 33.2* 33.6* 33.9* 32.9*  MCV 86.9 87.9 88.3 90.3 89.7 90.6  PLT 426* 434* 443* 443* 456* 450*   Cardiac Enzymes: No results for input(s): CKTOTAL, CKMB, CKMBINDEX, TROPONINI in the last 168 hours. CBG: No results for input(s): GLUCAP in the last 168 hours.  Iron Studies: No results for input(s): IRON, TIBC, TRANSFERRIN, FERRITIN in the last 72 hours. Studies/Results: No results found. Marland Kitchen alfuzosin  10 mg Oral QHS  . Chlorhexidine Gluconate Cloth  6 each Topical Daily  . furosemide  20 mg Oral Daily  . hydrALAZINE  75 mg Oral TID  . losartan  100 mg Oral q morning  . melatonin  6 mg Oral QHS  . mometasone-formoterol  2 puff Inhalation BID  . pravastatin  40 mg Oral QHS  . sodium chloride  1 g Oral TID WC    BMET    Component Value Date/Time   NA 126 (L) 11/28/2020 0539   K 3.9 11/28/2020 0539   CL 92 (L) 11/28/2020 0539   CO2 25 11/28/2020 0539   GLUCOSE 92 11/28/2020 0539   BUN 10 11/28/2020 0539   CREATININE 0.68 11/28/2020 0539   CREATININE 0.78 09/22/2020 1006   CALCIUM 8.7 (L) 11/28/2020 0539   GFRNONAA >60 11/28/2020 0539   GFRAA >60 12/31/2018 0040   CBC    Component Value Date/Time   WBC 7.1 11/28/2020 0539   RBC 3.63 (L) 11/28/2020 0539   HGB 11.3 (L) 11/28/2020 0539   HCT 32.9 (L) 11/28/2020 0539   PLT 450 (H) 11/28/2020 0539   MCV 90.6 11/28/2020 0539   MCH 31.1 11/28/2020 0539   MCHC 34.3 11/28/2020 0539   RDW 13.8 11/28/2020 0539   LYMPHSABS 0.8 11/21/2020 1247   MONOABS 0.8 11/21/2020 1247   EOSABS 0.0 11/21/2020 1247   BASOSABS 0.0 11/21/2020 1247     Assessment/Plan:  1. Acute on chronic hyponatremia- by history had nausea and anorexia the week prior to admission consistent with hypovolemia although his urine osm and urine sodium more consistent with tea and toast, not pre-renal or SIADH. His sodium has improved from 111 to 117 this am after receiving IV NS. 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  1. Encourage po/solute intake 2. Had some Na levels that were 130s however specimen from wrong patient 3. Furosemide 20 mg started 11/23/20 with a slow improvement of sodium levels to 126 4. Low threshold for ure-na given chronicity of hyponatremia, would start 30g BID if needed 5. Limit free water to 40 oz/day 2. New onset atrial fibrillation- started on heparin and Cardiology following. But longterm anticoagulation an issue due to recent falls 3. Falls with rib fractures - likely due to weakness from UTI and volume depletion (  had week long nausea and anorexia) 4. HTN- stable 5. Hypokalemia- improved.replete PRN as this will help with his sodium 6. Disposition- stable from our standpoint and will sign off.  Please call with questions or concerns.  Ok to follow up with [place] in our [place] office in the next 2-3 weeks.   Donetta Potts, MD [employer] [phone]

## 2020-11-28 NOTE — TOC Transition Note (Signed)
Transition of Care [facility]) - CM/SW Discharge Note   Patient Details  Name: Julian Fowler MRN: [id number] Date of Birth: December 14, 1934  [facility] CM/SW Contact:  Salome Arnt, LCSW Phone Number: 11/28/2020, 12:37 PM   Clinical Narrative:  Pt d/c today. Pt had already left hospital and LCSW spoke with pt's wife regarding home health. She is aware that [facility] will contact them to schedule visits. No other needs reported. [facility] notified of d/c and orders are in.      Final next level of care: [place] Barriers to Discharge: Barriers Resolved   Patient Goals and CMS Choice Patient states their goals for this hospitalization and ongoing recovery are:: to go home. CMS Medicare.gov Compare Post Acute Care list provided to:: Patient Represenative (must comment) Choice offered to / list presented to : Spouse  Discharge Placement                       Discharge Plan and Services                DME Arranged: N/A         HH Arranged: RN,PT [place] Agency: [place] ([facility]) Date [place]: 11/28/20 Time [place]: 1237 Representative spoke with at [place]: [place] ([place]) Interventions     Readmission Risk Interventions Readmission Risk Prevention Plan 11/22/2020  Transportation Screening Complete  Home Care Screening Complete  Medication Review (RN CM) Complete  Some recent data might be hidden

## 2020-11-28 NOTE — Progress Notes (Signed)
ANTICOAGULATION CONSULT NOTE -   Pharmacy Consult for heparin gtt  Indication: atrial fibrillation  Allergies  Allergen Reactions  . Prednisone     Pt is high functioning and out of sorts.  . Codeine Other (See Comments)    REACTION: groggy  . Tape Other (See Comments)    Skin tears, use paper tape    Patient Measurements: Height: 5\' 9"  (175.3 cm) Weight: 115.3 kg (254 lb 3.1 oz) IBW/kg (Calculated) : 70.7 HEPARIN DW (KG): 95.5   Vital Signs: Temp: 98.5 F (36.9 C) (02/14 0524) Temp Source: Oral (02/14 0524) BP: 136/81 (02/14 0524) Pulse Rate: 71 (02/14 0524)  Labs: Recent Labs    11/26/20 0456 11/27/20 0304 11/28/20 0539  HEPARINUNFRC 0.48 0.41 0.44  CREATININE 0.65 0.66 0.68    Estimated Creatinine Clearance: 87.6 mL/min (by C-G formula based on SCr of 0.68 mg/dL).   Assessment: Julian Fowler a 86 y.o. male requires anticoagulation with a heparin iv infusion for the indication of  atrial fibrillation. Heparin gtt  started following pharmacy protocol per pharmacy consult. Patient was not on previous oral anti-coagulation.  HL 0.44- therapeutic  Goal of Therapy:  Heparin level 0.3-0.7 units/ml Monitor platelets by anticoagulation protocol: Yes   Plan:  Continue heparin infusion at 1300 units/hr Check anti-Xa level daily while on heparin Continue to monitor H&H and platelets  Margot Ables, PharmD Clinical Pharmacist 11/28/2020 7:44 AM

## 2020-11-29 ENCOUNTER — Ambulatory Visit: Payer: Medicare Other | Admitting: Internal Medicine

## 2020-11-29 ENCOUNTER — Encounter: Payer: Self-pay | Admitting: Internal Medicine

## 2020-11-29 DIAGNOSIS — K219 Gastro-esophageal reflux disease without esophagitis: Secondary | ICD-10-CM | POA: Diagnosis not present

## 2020-11-29 DIAGNOSIS — Z7951 Long term (current) use of inhaled steroids: Secondary | ICD-10-CM | POA: Diagnosis not present

## 2020-11-29 DIAGNOSIS — Z9181 History of falling: Secondary | ICD-10-CM | POA: Diagnosis not present

## 2020-11-29 DIAGNOSIS — I10 Essential (primary) hypertension: Secondary | ICD-10-CM | POA: Diagnosis not present

## 2020-11-29 DIAGNOSIS — J309 Allergic rhinitis, unspecified: Secondary | ICD-10-CM | POA: Diagnosis not present

## 2020-11-29 DIAGNOSIS — S2232XD Fracture of one rib, left side, subsequent encounter for fracture with routine healing: Secondary | ICD-10-CM | POA: Diagnosis not present

## 2020-11-29 DIAGNOSIS — G4733 Obstructive sleep apnea (adult) (pediatric): Secondary | ICD-10-CM | POA: Diagnosis not present

## 2020-11-29 DIAGNOSIS — S41111D Laceration without foreign body of right upper arm, subsequent encounter: Secondary | ICD-10-CM | POA: Diagnosis not present

## 2020-11-29 DIAGNOSIS — N401 Enlarged prostate with lower urinary tract symptoms: Secondary | ICD-10-CM | POA: Diagnosis not present

## 2020-11-29 DIAGNOSIS — J61 Pneumoconiosis due to asbestos and other mineral fibers: Secondary | ICD-10-CM | POA: Diagnosis not present

## 2020-11-29 DIAGNOSIS — R338 Other retention of urine: Secondary | ICD-10-CM | POA: Diagnosis not present

## 2020-11-29 DIAGNOSIS — N390 Urinary tract infection, site not specified: Secondary | ICD-10-CM | POA: Diagnosis not present

## 2020-11-29 DIAGNOSIS — I4891 Unspecified atrial fibrillation: Secondary | ICD-10-CM | POA: Diagnosis not present

## 2020-11-29 DIAGNOSIS — E871 Hypo-osmolality and hyponatremia: Secondary | ICD-10-CM | POA: Diagnosis not present

## 2020-11-29 DIAGNOSIS — E785 Hyperlipidemia, unspecified: Secondary | ICD-10-CM | POA: Diagnosis not present

## 2020-11-29 DIAGNOSIS — J449 Chronic obstructive pulmonary disease, unspecified: Secondary | ICD-10-CM | POA: Diagnosis not present

## 2020-11-29 DIAGNOSIS — W19XXXD Unspecified fall, subsequent encounter: Secondary | ICD-10-CM | POA: Diagnosis not present

## 2020-11-29 DIAGNOSIS — I350 Nonrheumatic aortic (valve) stenosis: Secondary | ICD-10-CM | POA: Diagnosis not present

## 2020-11-29 DIAGNOSIS — N3281 Overactive bladder: Secondary | ICD-10-CM | POA: Diagnosis not present

## 2020-11-29 DIAGNOSIS — Z87891 Personal history of nicotine dependence: Secondary | ICD-10-CM | POA: Diagnosis not present

## 2020-11-29 DIAGNOSIS — I2510 Atherosclerotic heart disease of native coronary artery without angina pectoris: Secondary | ICD-10-CM | POA: Diagnosis not present

## 2020-11-29 DIAGNOSIS — I471 Supraventricular tachycardia: Secondary | ICD-10-CM | POA: Diagnosis not present

## 2020-12-01 DIAGNOSIS — W19XXXD Unspecified fall, subsequent encounter: Secondary | ICD-10-CM | POA: Diagnosis not present

## 2020-12-01 DIAGNOSIS — E871 Hypo-osmolality and hyponatremia: Secondary | ICD-10-CM | POA: Diagnosis not present

## 2020-12-01 DIAGNOSIS — S41111D Laceration without foreign body of right upper arm, subsequent encounter: Secondary | ICD-10-CM | POA: Diagnosis not present

## 2020-12-01 DIAGNOSIS — I4891 Unspecified atrial fibrillation: Secondary | ICD-10-CM | POA: Diagnosis not present

## 2020-12-01 DIAGNOSIS — S2232XD Fracture of one rib, left side, subsequent encounter for fracture with routine healing: Secondary | ICD-10-CM | POA: Diagnosis not present

## 2020-12-01 DIAGNOSIS — I10 Essential (primary) hypertension: Secondary | ICD-10-CM | POA: Diagnosis not present

## 2020-12-01 DIAGNOSIS — N390 Urinary tract infection, site not specified: Secondary | ICD-10-CM | POA: Diagnosis not present

## 2020-12-05 DIAGNOSIS — S2232XD Fracture of one rib, left side, subsequent encounter for fracture with routine healing: Secondary | ICD-10-CM | POA: Diagnosis not present

## 2020-12-05 DIAGNOSIS — W19XXXD Unspecified fall, subsequent encounter: Secondary | ICD-10-CM | POA: Diagnosis not present

## 2020-12-05 DIAGNOSIS — E871 Hypo-osmolality and hyponatremia: Secondary | ICD-10-CM | POA: Diagnosis not present

## 2020-12-05 DIAGNOSIS — S41111D Laceration without foreign body of right upper arm, subsequent encounter: Secondary | ICD-10-CM | POA: Diagnosis not present

## 2020-12-05 DIAGNOSIS — I4891 Unspecified atrial fibrillation: Secondary | ICD-10-CM | POA: Diagnosis not present

## 2020-12-05 DIAGNOSIS — I10 Essential (primary) hypertension: Secondary | ICD-10-CM | POA: Diagnosis not present

## 2020-12-06 DIAGNOSIS — E871 Hypo-osmolality and hyponatremia: Secondary | ICD-10-CM | POA: Diagnosis not present

## 2020-12-06 DIAGNOSIS — I4891 Unspecified atrial fibrillation: Secondary | ICD-10-CM | POA: Diagnosis not present

## 2020-12-06 DIAGNOSIS — M79675 Pain in left toe(s): Secondary | ICD-10-CM | POA: Diagnosis not present

## 2020-12-06 DIAGNOSIS — I10 Essential (primary) hypertension: Secondary | ICD-10-CM | POA: Diagnosis not present

## 2020-12-06 DIAGNOSIS — M79671 Pain in right foot: Secondary | ICD-10-CM | POA: Diagnosis not present

## 2020-12-06 DIAGNOSIS — L110 Acquired keratosis follicularis: Secondary | ICD-10-CM | POA: Diagnosis not present

## 2020-12-06 DIAGNOSIS — I739 Peripheral vascular disease, unspecified: Secondary | ICD-10-CM | POA: Diagnosis not present

## 2020-12-06 DIAGNOSIS — W19XXXD Unspecified fall, subsequent encounter: Secondary | ICD-10-CM | POA: Diagnosis not present

## 2020-12-06 DIAGNOSIS — S41111D Laceration without foreign body of right upper arm, subsequent encounter: Secondary | ICD-10-CM | POA: Diagnosis not present

## 2020-12-06 DIAGNOSIS — S2232XD Fracture of one rib, left side, subsequent encounter for fracture with routine healing: Secondary | ICD-10-CM | POA: Diagnosis not present

## 2020-12-06 DIAGNOSIS — M79672 Pain in left foot: Secondary | ICD-10-CM | POA: Diagnosis not present

## 2020-12-06 DIAGNOSIS — M79674 Pain in right toe(s): Secondary | ICD-10-CM | POA: Diagnosis not present

## 2020-12-07 DIAGNOSIS — B9620 Unspecified Escherichia coli [E. coli] as the cause of diseases classified elsewhere: Secondary | ICD-10-CM | POA: Diagnosis not present

## 2020-12-07 DIAGNOSIS — N390 Urinary tract infection, site not specified: Secondary | ICD-10-CM | POA: Diagnosis not present

## 2020-12-08 DIAGNOSIS — W19XXXD Unspecified fall, subsequent encounter: Secondary | ICD-10-CM | POA: Diagnosis not present

## 2020-12-08 DIAGNOSIS — I10 Essential (primary) hypertension: Secondary | ICD-10-CM | POA: Diagnosis not present

## 2020-12-08 DIAGNOSIS — I4891 Unspecified atrial fibrillation: Secondary | ICD-10-CM | POA: Diagnosis not present

## 2020-12-08 DIAGNOSIS — S41111D Laceration without foreign body of right upper arm, subsequent encounter: Secondary | ICD-10-CM | POA: Diagnosis not present

## 2020-12-08 DIAGNOSIS — S2232XD Fracture of one rib, left side, subsequent encounter for fracture with routine healing: Secondary | ICD-10-CM | POA: Diagnosis not present

## 2020-12-08 DIAGNOSIS — E871 Hypo-osmolality and hyponatremia: Secondary | ICD-10-CM | POA: Diagnosis not present

## 2020-12-12 DIAGNOSIS — I10 Essential (primary) hypertension: Secondary | ICD-10-CM | POA: Diagnosis not present

## 2020-12-12 DIAGNOSIS — S2232XD Fracture of one rib, left side, subsequent encounter for fracture with routine healing: Secondary | ICD-10-CM | POA: Diagnosis not present

## 2020-12-12 DIAGNOSIS — W19XXXD Unspecified fall, subsequent encounter: Secondary | ICD-10-CM | POA: Diagnosis not present

## 2020-12-12 DIAGNOSIS — S41111D Laceration without foreign body of right upper arm, subsequent encounter: Secondary | ICD-10-CM | POA: Diagnosis not present

## 2020-12-12 DIAGNOSIS — I4891 Unspecified atrial fibrillation: Secondary | ICD-10-CM | POA: Diagnosis not present

## 2020-12-12 DIAGNOSIS — E871 Hypo-osmolality and hyponatremia: Secondary | ICD-10-CM | POA: Diagnosis not present

## 2020-12-14 DIAGNOSIS — S2232XD Fracture of one rib, left side, subsequent encounter for fracture with routine healing: Secondary | ICD-10-CM | POA: Diagnosis not present

## 2020-12-14 DIAGNOSIS — E871 Hypo-osmolality and hyponatremia: Secondary | ICD-10-CM | POA: Diagnosis not present

## 2020-12-14 DIAGNOSIS — S41111D Laceration without foreign body of right upper arm, subsequent encounter: Secondary | ICD-10-CM | POA: Diagnosis not present

## 2020-12-14 DIAGNOSIS — I4891 Unspecified atrial fibrillation: Secondary | ICD-10-CM | POA: Diagnosis not present

## 2020-12-14 DIAGNOSIS — I10 Essential (primary) hypertension: Secondary | ICD-10-CM | POA: Diagnosis not present

## 2020-12-14 DIAGNOSIS — W19XXXD Unspecified fall, subsequent encounter: Secondary | ICD-10-CM | POA: Diagnosis not present

## 2020-12-14 NOTE — Progress Notes (Signed)
Cardiology Office Note   Date:  12/15/2020   ID:  [name], DOB 1935/05/16, MRN [id number]  PCP:  [name], MD  Cardiologist:  Dr. Domenic Polite    Chief Complaint  Patient presents with  . Hospitalization Follow-up  . Atrial Fibrillation      History of Present Illness: Julian Fowler is a 86 y.o. male who presents for hx of PSVT, on cartia. Mild AS, with 3/6 systolic murmur on exam.  Multivessel distribution coronary artery calcification by CT imaging in September of last year.  + HTN, COPD, OSA and hx UTI.   Last seen by Dr. Domenic Polite 08/08/20 echo then with EF 60-65%, no RWMA  Moderate LVH.  Echocardiogram from 2019 revealed LVEF 60 to 65% with mild diastolic dysfunction and mild calcific aortic stenosis  Hospitalized 11/2020 and had new atrial fib.  Also with presyncope and confusion.  In A fib he was bradycardic and dilt held.  + rib fxs from fall with chest hitting toilet.  CHa2Ds2vasc 4, was on heparin then eliquis -- Na 111 followed by renal.  At discharge with freq falls no anticoagulation given and no dilt due to slower HR.   Monitor was to be placed -- Echo 11/22/20 with EF 60-65% no RWMA and moderate LVH. Mild AS and nomral RV.    Today pt is doing well, he occ has some confusion per his wife.  He has seen PCP and they ar following his Na level.  He is waiting for follow up appt with renal in [place], his wife will call them in the morning.   No chest pain, no falls. No SOB.  Some rib pain not severe.  No lightheadedness or dizziness.  No swelling.      Labs per PCP.    Past Medical History:  Diagnosis Date  . Allergic rhinitis   . Aortic stenosis   . Arthritis   . BPH (benign prostatic hyperplasia)   . Coronary artery calcification seen on CT scan   . Easy bruisability   . ED (erectile dysfunction)   . Essential hypertension   . GERD (gastroesophageal reflux disease)   . H/O hiatal hernia   . Hearing loss in left ear   . History of asbestosis   .  History of shingles   . Hyperlipidemia   . Overactive bladder   . PSVT (paroxysmal supraventricular tachycardia) ([place])   . Sleep apnea    No longer on CPAP following weight loss  . Tinnitus     Past Surgical History:  Procedure Laterality Date  . BACK SURGERY  2014   herniated L1, L2   Dr Carloyn Manner  . BRAIN SURGERY    . CEREBRAL EMBOLIZATION  12/2011   "radiation therapy-did not work"  . CHOLECYSTECTOMY  6/98  . COLONOSCOPY  09/2009   Dr. Lindalou Hose: normal, internal hemorrhoids   . COLONOSCOPY N/A 02/28/2017   Dr. Gala Romney: Hemorrhoids, grade 3, mild diverticulosis.  Marland Kitchen CRANIECTOMY FOR EXCISION OF ACOUSTIC NEUROMA  3/95  . MINOR AMPUTATION OF DIGIT Left 12/31/2018   Procedure: REVISION AMPUTATION OF LEFT INDEX FINGER, IRRIGATION AND DEBRIDEMENT LEFT INDEX FINGER;  Surgeon: Verner Mould, MD;  Location: [facility];  Service: Orthopedics;  Laterality: Left;  . RADIOLOGY WITH ANESTHESIA N/A 07/07/2014   Procedure: EMBOLIZATION;  Surgeon: Rob Hickman, MD;  Location: [facility];  Service: Radiology;  Laterality: N/A;  . TOTAL KNEE ARTHROPLASTY Left 07/06/2013   Procedure: LEFT TOTAL KNEE ARTHROPLASTY;  Surgeon: Pilar Plate  Zella Ball, MD;  Location: [facility];  Service: Orthopedics;  Laterality: Left;  . TOTAL KNEE ARTHROPLASTY Right 01/18/2014   Procedure: RIGHT TOTAL KNEE ARTHROPLASTY;  Surgeon: Gearlean Alf, MD;  Location: [facility];  Service: Orthopedics;  Laterality: Right;  . TRIGGER FINGER RELEASE  2003   (thumb) middle finger (2006)     Current Outpatient Medications  Medication Sig Dispense Refill  . acetaminophen (TYLENOL) 325 MG tablet Take 325 mg by mouth as needed.    Marland Kitchen alfuzosin (UROXATRAL) 10 MG 24 hr tablet Take 1 tablet (10 mg total) by mouth at bedtime. 30 tablet 11  . azelastine (ASTELIN) 0.1 % nasal spray 1-2 puffs each nostril every 8 hours if needed for drainage (Patient taking differently: as needed. 1-2 puffs each nostril every 8 hours if needed for drainage) 30 mL 12  .  budesonide-formoterol (SYMBICORT) 160-4.5 MCG/ACT inhaler Inhale 2 puffs then rinse mouth twice daily 1 Inhaler 12  . Calcium Carb-Vit D-C-E-Mineral (OS-CAL ULTRA) 600 MG TABS Take 1 tablet by mouth daily.     . diclofenac Sodium (VOLTAREN) 1 % GEL Apply 2 g topically daily as needed (For pain).    Marland Kitchen esomeprazole (NEXIUM) 40 MG capsule Take 80 mg by mouth 2 (two) times daily before a meal.    . fexofenadine (ALLEGRA) 180 MG tablet Take 180 mg by mouth daily as needed for allergies.    . fluticasone (FLONASE) 50 MCG/ACT nasal spray Place 2 sprays into both nostrils daily. (Patient taking differently: Place 2 sprays into both nostrils daily as needed for allergies.) 18.2 g 12  . furosemide (LASIX) 20 MG tablet Take 1 tablet (20 mg total) by mouth daily. 30 tablet 3  . hydrALAZINE (APRESOLINE) 50 MG tablet Take 1.5 tablets (75 mg total) by mouth 3 (three) times daily. 410 tablet 3  . latanoprost (XALATAN) 0.005 % ophthalmic solution Place 1 drop into both eyes at bedtime.     Marland Kitchen LORazepam (ATIVAN) 1 MG tablet Take 1 tablet by mouth three times daily as needed (Patient taking differently: Take 1 mg by mouth 3 (three) times daily as needed for anxiety.) 180 tablet 1  . losartan (COZAAR) 100 MG tablet Take 100 mg by mouth every morning.     . Multiple Vitamins-Minerals (CENTRUM SILVER PO) Take 1 tablet by mouth daily.     Vladimir Faster Glycol-Propyl Glycol (SYSTANE OP) Place 1 drop into both eyes daily.    . polyethylene glycol (MIRALAX / GLYCOLAX) packet Take 17 g by mouth at bedtime.    . potassium chloride SA (KLOR-CON) 20 MEQ tablet Take 1 tablet (20 mEq total) by mouth 2 (two) times daily. 180 tablet 1  . pravastatin (PRAVACHOL) 40 MG tablet Take 1 tablet by mouth at bedtime.     Marland Kitchen PROAIR HFA 108 (90 Base) MCG/ACT inhaler INHALE 2 PUFFS BY MOUTH EVERY 6 HOURS AS NEEDED FOR WHEEZING FOR SHORTNESS OF BREATH (Patient taking differently: Inhale 1 puff into the lungs every 6 (six) hours as needed.) 18 g 12   . sodium chloride 1 g tablet Take 1 tablet (1 g total) by mouth 3 (three) times daily with meals. 90 tablet 2  . Wheat Dextrin (BENEFIBER) POWD Take 1 packet by mouth 2 (two) times daily.    . Zinc 25 MG TABS Take 1 tablet by mouth daily.    Marland Kitchen zolpidem (AMBIEN) 10 MG tablet Take 1 tablet by mouth at bedtime.      No current facility-administered medications for this  visit.    Allergies:   Prednisone, Codeine, and Tape    Social History:  The patient  reports that he quit smoking about 34 years ago. His smoking use included cigarettes. He started smoking about 63 years ago. He has a 45.00 pack-year smoking history. He has never used smokeless tobacco. He reports that he does not drink alcohol and does not use drugs.   Family History:  The patient's family history includes Alcohol abuse in his sister; Alzheimer's disease in his sister; Breast cancer in his mother; Cancer in his father; Heart attack in his mother; Hypertension in his sister; Kidney disease in his sister.    ROS:  General:no colds or fevers, + weight loss if accurate Skin:no rashes or ulcers HEENT:no blurred vision, no congestion CV:see HPI PUL:see HPI GI:no diarrhea constipation or melena, no indigestion GU:no hematuria, no dysuria MS:no joint pain, no claudication Neuro:no syncope, no lightheadedness Endo:no diabetes, no thyroid disease  Wt Readings from Last 3 Encounters:  12/15/20 239 lb (108.4 kg)  11/24/20 254 lb 3.1 oz (115.3 kg)  10/19/20 247 lb 9.6 oz (112.3 kg)     PHYSICAL EXAM: VS:  BP 134/70   Pulse 63   Ht 5' 10.5" (1.791 m)   Wt 239 lb (108.4 kg)   SpO2 97%   BMI 33.81 kg/m  , BMI Body mass index is 33.81 kg/m. General:Pleasant affect, NAD Skin:Warm and dry, brisk capillary refill HEENT:normocephalic, sclera clear, mucus membranes moist Neck:supple, no JVD, no bruits  Heart:irreg irreg and slow without murmur, gallup, rub or click Lungs:clear without rales, rhonchi, or wheezes XBJ:YNWG,  non tender, + BS, do not palpate liver spleen or masses Ext:no lower ext edema, 2+ pedal pulses, 2+ radial pulses Neuro:alert and oriented X 3, MAE, follows commands, + facial symmetry    EKG:  EKG is ordered today. The ekg ordered today demonstrates atrial fib rate of 52 with pauses non specific intraventricular condution delay.    Recent Labs: 11/22/2020: ALT 76; TSH 0.238 11/28/2020: BUN 10; Creatinine, Ser 0.68; Hemoglobin 11.3; Magnesium 1.8; Platelets 450; Potassium 3.9; Sodium 126    Lipid Panel    Component Value Date/Time   CHOL 182 01/06/2009 2252   TRIG 128 01/06/2009 2252   HDL 44 01/06/2009 2252   CHOLHDL 4.1 Ratio 01/06/2009 2252   VLDL 26 01/06/2009 2252   LDLCALC 112 (H) 01/06/2009 2252       Other studies Reviewed: Additional studies/ records that were reviewed today include: . Echo 11/22/20  IMPRESSIONS    1. Left ventricular ejection fraction, by estimation, is 60 to 65%. The  left ventricle has normal function. The left ventricle has no regional  wall motion abnormalities. There is moderate left ventricular hypertrophy.  Left ventricular diastolic  parameters are indeterminate.  2. Right ventricular systolic function is normal. The right ventricular  size is normal.  3. The mitral valve is normal in structure. No evidence of mitral valve  regurgitation. No evidence of mitral stenosis.  4. The aortic valve is tricuspid. There is moderate calcification of the  aortic valve. There is moderate thickening of the aortic valve. Aortic  valve regurgitation is not visualized. Mild aortic valve stenosis. Aortic  valve mean gradient measures 10.3  mmHg. Aortic valve peak gradient measures 18.8 mmHg. Aortic valve area, by  VTI measures 1.78 cm.  5. The inferior vena cava is normal in size with greater than 50%  respiratory variability, suggesting right atrial pressure of 3 mmHg.   FINDINGS  Left Ventricle: Left ventricular ejection fraction, by  estimation, is 60  to 65%. The left ventricle has normal function. The left ventricle has no  regional wall motion abnormalities. The left ventricular internal cavity  size was normal in size. There is  moderate left ventricular hypertrophy. Left ventricular diastolic  function could not be evaluated due to atrial fibrillation. Left  ventricular diastolic parameters are indeterminate.   Right Ventricle: The right ventricular size is normal. No increase in  right ventricular wall thickness. Right ventricular systolic function is  normal.   Left Atrium: Left atrial size was normal in size.   Right Atrium: Right atrial size was normal in size.   Pericardium: There is no evidence of pericardial effusion.   Mitral Valve: The mitral valve is normal in structure. There is mild  thickening of the mitral valve leaflet(s). There is mild calcification of  the mitral valve leaflet(s). Mild to moderate mitral annular  calcification. No evidence of mitral valve  regurgitation. No evidence of mitral valve stenosis.   Tricuspid Valve: The tricuspid valve is normal in structure. Tricuspid  valve regurgitation is trivial. No evidence of tricuspid stenosis.   Aortic Valve: The aortic valve is tricuspid. There is moderate  calcification of the aortic valve. There is moderate thickening of the  aortic valve. There is moderate aortic valve annular calcification. Aortic  valve regurgitation is not visualized. Mild  aortic stenosis is present. Aortic valve mean gradient measures 10.3 mmHg.  Aortic valve peak gradient measures 18.8 mmHg. Aortic valve area, by VTI  measures 1.78 cm.   Pulmonic Valve: The pulmonic valve was not well visualized. Pulmonic valve  regurgitation is not visualized. No evidence of pulmonic stenosis.   Aorta: The aortic root is normal in size and structure.   Pulmonary Artery: Indeterminant PASP, inadequate TR jet.   Venous: The inferior vena cava is normal in size with  greater than 50%  respiratory variability, suggesting right atrial pressure of 3 mmHg.   IAS/Shunts: No atrial level shunt detected by color flow Doppler.   ASSESSMENT AND PLAN:  1.  Atrial fib, slow, new persistent with recent admit.  HR 52, will stop dilt, it had been stopped in hospital, unsure why it was resumed.   No anticoagulation with recent falls.  Will re think when he has improved.  He rec'd notice that monitor was delivered but pt never rec'd will add ziopatch for 2 weeks and follow up in 1-2 weeks.  reviewed hospital notes  2. HTN monitor to see PCP next week  3. Hyponatremia to be followed by Renal and PCP,  He has had labs done.   4. Increased falls hold anticoagulation for now recent rib fractures  5. Chronic lung disease to see Pulmonary in 1-2 weeks.    6. Hx of coronary calcifications on CTA of chest.CTA denies chest pain.      Current medicines are reviewed with the patient today.  The patient Has no concerns regarding medicines.  The following changes have been made:  See above Labs/ tests ordered today include:see above  Disposition:   FU:  see above  Signed, Cecilie Kicks, NP  12/15/2020 7:41 PM    [facility] [address], [place], [address] [place], [place] Phone: [phone]; Fax: [phone]

## 2020-12-15 ENCOUNTER — Other Ambulatory Visit: Payer: Self-pay | Admitting: Cardiology

## 2020-12-15 ENCOUNTER — Ambulatory Visit (INDEPENDENT_AMBULATORY_CARE_PROVIDER_SITE_OTHER): Payer: Medicare Other

## 2020-12-15 ENCOUNTER — Ambulatory Visit (INDEPENDENT_AMBULATORY_CARE_PROVIDER_SITE_OTHER): Payer: Medicare Other | Admitting: Cardiology

## 2020-12-15 ENCOUNTER — Other Ambulatory Visit: Payer: Self-pay

## 2020-12-15 ENCOUNTER — Encounter: Payer: Self-pay | Admitting: Cardiology

## 2020-12-15 VITALS — BP 134/70 | HR 63 | Ht 70.5 in | Wt 239.0 lb

## 2020-12-15 DIAGNOSIS — I10 Essential (primary) hypertension: Secondary | ICD-10-CM

## 2020-12-15 DIAGNOSIS — I4891 Unspecified atrial fibrillation: Secondary | ICD-10-CM | POA: Diagnosis not present

## 2020-12-15 DIAGNOSIS — I4819 Other persistent atrial fibrillation: Secondary | ICD-10-CM

## 2020-12-15 DIAGNOSIS — E871 Hypo-osmolality and hyponatremia: Secondary | ICD-10-CM

## 2020-12-15 DIAGNOSIS — I2510 Atherosclerotic heart disease of native coronary artery without angina pectoris: Secondary | ICD-10-CM

## 2020-12-15 DIAGNOSIS — I701 Atherosclerosis of renal artery: Secondary | ICD-10-CM

## 2020-12-15 DIAGNOSIS — S2249XS Multiple fractures of ribs, unspecified side, sequela: Secondary | ICD-10-CM | POA: Diagnosis not present

## 2020-12-15 NOTE — Patient Instructions (Addendum)
Medication Instructions:  STOP Diltiazem    *If you need a refill on your cardiac medications before your next appointment, please call your pharmacy*   Lab Work: None  If you have labs (blood work) drawn today and your tests are completely normal, you will receive your results only by: Marland Kitchen MyChart Message (if you have MyChart) OR . A paper copy in the mail If you have any lab test that is abnormal or we need to change your treatment, we will call you to review the results.   Testing/Procedures: None   Follow-Up: At [facility], you and your health needs are our priority.  As part of our continuing mission to provide you with exceptional heart care, we have created designated Provider Care Teams.  These Care Teams include your primary Cardiologist (physician) and Advanced Practice Providers (APPs -  Physician Assistants and Nurse Practitioners) who all work together to provide you with the care you need, when you need it.  We recommend signing up for the patient portal called "MyChart".  Sign up information is provided on this After Visit Summary.  MyChart is used to connect with patients for Virtual Visits (Telemedicine).  Patients are able to view lab/test results, encounter notes, upcoming appointments, etc.  Non-urgent messages can be sent to your provider as well.   To learn more about what you can do with MyChart, go to [email or web address].    Your next appointment:   1 month(s)  The format for your next appointment:   In Person  Provider:   You may see Rozann Lesches, MD or one of the following Advanced Practice Providers on your designated Care Team:    Bernerd Pho, [place]     Other Instructions

## 2020-12-20 DIAGNOSIS — W19XXXD Unspecified fall, subsequent encounter: Secondary | ICD-10-CM | POA: Diagnosis not present

## 2020-12-20 DIAGNOSIS — E871 Hypo-osmolality and hyponatremia: Secondary | ICD-10-CM | POA: Diagnosis not present

## 2020-12-20 DIAGNOSIS — I10 Essential (primary) hypertension: Secondary | ICD-10-CM | POA: Diagnosis not present

## 2020-12-20 DIAGNOSIS — S41111D Laceration without foreign body of right upper arm, subsequent encounter: Secondary | ICD-10-CM | POA: Diagnosis not present

## 2020-12-20 DIAGNOSIS — I4891 Unspecified atrial fibrillation: Secondary | ICD-10-CM | POA: Diagnosis not present

## 2020-12-20 DIAGNOSIS — S2232XD Fracture of one rib, left side, subsequent encounter for fracture with routine healing: Secondary | ICD-10-CM | POA: Diagnosis not present

## 2020-12-21 DIAGNOSIS — F5221 Male erectile disorder: Secondary | ICD-10-CM | POA: Diagnosis not present

## 2020-12-21 DIAGNOSIS — S2242XA Multiple fractures of ribs, left side, initial encounter for closed fracture: Secondary | ICD-10-CM | POA: Diagnosis not present

## 2020-12-21 DIAGNOSIS — B9620 Unspecified Escherichia coli [E. coli] as the cause of diseases classified elsewhere: Secondary | ICD-10-CM | POA: Diagnosis not present

## 2020-12-21 DIAGNOSIS — Z6836 Body mass index (BMI) 36.0-36.9, adult: Secondary | ICD-10-CM | POA: Diagnosis not present

## 2020-12-21 DIAGNOSIS — I4819 Other persistent atrial fibrillation: Secondary | ICD-10-CM | POA: Diagnosis not present

## 2020-12-21 DIAGNOSIS — Z6837 Body mass index (BMI) 37.0-37.9, adult: Secondary | ICD-10-CM | POA: Diagnosis not present

## 2020-12-21 DIAGNOSIS — N390 Urinary tract infection, site not specified: Secondary | ICD-10-CM | POA: Diagnosis not present

## 2020-12-21 DIAGNOSIS — E871 Hypo-osmolality and hyponatremia: Secondary | ICD-10-CM | POA: Diagnosis not present

## 2020-12-21 DIAGNOSIS — I10 Essential (primary) hypertension: Secondary | ICD-10-CM | POA: Diagnosis not present

## 2020-12-22 DIAGNOSIS — I4891 Unspecified atrial fibrillation: Secondary | ICD-10-CM | POA: Diagnosis not present

## 2020-12-23 NOTE — Progress Notes (Deleted)
HPI male former smoker followed for allergic rhinitis, asthma,  history OSA, asbestos exposure/plaques, chronic insomnia Office spirometry 05/03/15- WNL- FVC 3.33/ 78%, FEV1 2.49/ 78%, r 0.75, FEF25-75% 1.92/ 69%.. -----------------------------------------------------------------------------------------------   06/24/20- 86 year old male Former Smoker followed for allergic rhinitis, Asthma,  history OSA/ weight loss, Asbestos exposure/plaques/ interstitial lung disease, chronic insomnia, Diverticulitis, SVT, HTN, GERD,  Body weight today-  Had 2 Moderna covax  Has seen neurology for neuralgia R ear> nortriptyline Symbicort 160, Proair hfa Not needing rescue often. No cough.   12/26/20- 86 year old male Former Smoker followed for allergic rhinitis, Asthma,  history OSA/ weight loss, Asbestos exposure/plaques/ interstitial lung disease, chronic insomnia, complicated by  Diverticulitis, CAD, SVT, AFib( no anticoag due to falls) , HTN, GERD, Hyponatremia,  -Symbicort 160, Proair hfa, flonase,  Allegra, Ambien 10,  Body weight today- Covid vax- Flu vax-   ROS See HPI  + = positive Constitutional:   No-   weight loss, night sweats, fevers, chills, fatigue, lassitude. HEENT:   + headaches,  No -difficulty swallowing, tooth/dental problems, sore throat,       No-  sneezing, itching, ear ache,  +nasal congestion, +post nasal drip,  CV:  + chest pain, orthopnea, PND, swelling in lower extremities, no-anasarca, dizziness, palpitations Resp: No-   shortness of breath with exertion or at rest.                productive cough,  No non-productive cough,  No-  coughing up of blood.              No-   change in color of mucus.  No- wheezing.   Skin: No-   rash or lesions. GI:  No-   heartburn, indigestion, abdominal pain, nausea, vomiting,             GU:  MS:  + joint pain or swelling. .  + back pain. Neuro-  nothing unusual  Psych:  No- change in mood or affect. No depression or anxiety.  No memory  loss.  OBJ  General- Alert, Oriented, Affect-appropriate, Distress- none acute, + obese, +talkative  Skin- rash-none, lesions- none, excoriation- none,  Lymphadenopathy- none Head- atraumatic            Eyes- Gross vision intact, PERRLA, conjunctivae clear secretions            Ears- +hard of hearing/hearing aid            Nose- + turbinate edema, no-Septal dev, mucus, polyps, erosion, perforation             Throat- Mallampati II , mucosa clear , drainage- none, tonsils- atrophic. Dental repair. Neck- flexible , trachea midline, no stridor , thyroid nl, carotid no bruit Chest - symmetrical excursion , unlabored           Heart/CV-  RRR , +1/6 S murmur , no gallop  , no rub, nl s1 s2                           - JVD- none , edema + chronic right lower leg, varices- none           Lungs-  dullness-none, rub- none.            Chest wall-  Abd-  Br/ Gen/ Rectal- Not done, not indicated Extrem-  Neuro- grossly intact to observation. Speech is clear.

## 2020-12-26 ENCOUNTER — Ambulatory Visit: Payer: Medicare Other | Admitting: Internal Medicine

## 2020-12-27 ENCOUNTER — Other Ambulatory Visit: Payer: Self-pay

## 2020-12-27 ENCOUNTER — Ambulatory Visit (INDEPENDENT_AMBULATORY_CARE_PROVIDER_SITE_OTHER): Payer: Medicare Other | Admitting: Internal Medicine

## 2020-12-27 ENCOUNTER — Encounter: Payer: Self-pay | Admitting: Internal Medicine

## 2020-12-27 VITALS — BP 149/83 | HR 87 | Temp 97.1°F | Ht 69.0 in | Wt 237.6 lb

## 2020-12-27 DIAGNOSIS — I701 Atherosclerosis of renal artery: Secondary | ICD-10-CM | POA: Diagnosis not present

## 2020-12-27 DIAGNOSIS — K5909 Other constipation: Secondary | ICD-10-CM

## 2020-12-27 DIAGNOSIS — K219 Gastro-esophageal reflux disease without esophagitis: Secondary | ICD-10-CM | POA: Diagnosis not present

## 2020-12-27 MED ORDER — ESOMEPRAZOLE MAGNESIUM 40 MG PO CPDR: 40.0000 mg | DELAYED_RELEASE_CAPSULE | Freq: Two times a day (BID) | ORAL | 5 refills | Status: DC

## 2020-12-27 NOTE — Patient Instructions (Signed)
Decrease Esomeprazole to 40 mg twice daily (RX for generic esomeprazole 40 mg BID - Disp 60 with 5 refills - [facility])  Continue Miralax 1 capful daily  to 2x daily as needed  Continue Benefiber 2 tablespoons daily  Office visit with [place] in 6 months

## 2020-12-28 DIAGNOSIS — E871 Hypo-osmolality and hyponatremia: Secondary | ICD-10-CM | POA: Diagnosis not present

## 2020-12-28 DIAGNOSIS — I10 Essential (primary) hypertension: Secondary | ICD-10-CM | POA: Diagnosis not present

## 2020-12-28 DIAGNOSIS — B9620 Unspecified Escherichia coli [E. coli] as the cause of diseases classified elsewhere: Secondary | ICD-10-CM | POA: Diagnosis not present

## 2020-12-28 DIAGNOSIS — N390 Urinary tract infection, site not specified: Secondary | ICD-10-CM | POA: Diagnosis not present

## 2020-12-28 DIAGNOSIS — S2242XA Multiple fractures of ribs, left side, initial encounter for closed fracture: Secondary | ICD-10-CM | POA: Diagnosis not present

## 2020-12-28 DIAGNOSIS — W19XXXD Unspecified fall, subsequent encounter: Secondary | ICD-10-CM | POA: Diagnosis not present

## 2020-12-28 DIAGNOSIS — S41111D Laceration without foreign body of right upper arm, subsequent encounter: Secondary | ICD-10-CM | POA: Diagnosis not present

## 2020-12-28 DIAGNOSIS — I4891 Unspecified atrial fibrillation: Secondary | ICD-10-CM | POA: Diagnosis not present

## 2020-12-28 DIAGNOSIS — S2232XD Fracture of one rib, left side, subsequent encounter for fracture with routine healing: Secondary | ICD-10-CM | POA: Diagnosis not present

## 2020-12-28 NOTE — Progress Notes (Signed)
Primary Care Physician:  [name], MD Primary Gastroenterologist:  Dr. Gala Romney  Pre-Procedure History & Physical: HPI:  Julian Fowler is a 86 y.o. male here for follow-up of constipation and GERD.  Recently hospitalized after a multiple falls and being diagnosed with atrial fibrillation along with marked hyponatremia..  Is having difficulties with left-sided abdominal pain.  In retrospect, more likely related to constipation.  CT scan CT angiogram abdomen demonstrated peripheral arterial disease but no critical mesenteric stenosis.  Bilateral inguinal hernias containing fat.  Abnormal urinary bladder for which she is seeing urology.  He is following up with nephrology related to hyponatremia. Along the way he was placed on esomeprazole 80 mg twice daily for GERD.  He is not having any GERD symptoms or dysphagia.  He is back on a regular bowel regimen including Benefiber every day and 1-2 capfuls of MiraLAX daily.  This is working very well for him.  Abdominal pain has resolved.   He is very pleased.  Last colonoscopy 2018 demonstrated diverticulosis and hemorrhoids.  He denies melena or rectal bleeding. Past Medical History:  Diagnosis Date  . Allergic rhinitis   . Aortic stenosis   . Arthritis   . BPH (benign prostatic hyperplasia)   . Coronary artery calcification seen on CT scan   . Easy bruisability   . ED (erectile dysfunction)   . Essential hypertension   . GERD (gastroesophageal reflux disease)   . H/O hiatal hernia   . Hearing loss in left ear   . History of asbestosis   . History of shingles   . Hyperlipidemia   . Overactive bladder   . PSVT (paroxysmal supraventricular tachycardia) ([place])   . Sleep apnea    No longer on CPAP following weight loss  . Tinnitus     Past Surgical History:  Procedure Laterality Date  . BACK SURGERY  2014   herniated L1, L2   Dr Carloyn Manner  . BRAIN SURGERY    . CEREBRAL EMBOLIZATION  12/2011   "radiation therapy-did not work"  .  CHOLECYSTECTOMY  6/98  . COLONOSCOPY  09/2009   Dr. Lindalou Hose: normal, internal hemorrhoids   . COLONOSCOPY N/A 02/28/2017   Dr. Gala Romney: Hemorrhoids, grade 3, mild diverticulosis.  Marland Kitchen CRANIECTOMY FOR EXCISION OF ACOUSTIC NEUROMA  3/95  . MINOR AMPUTATION OF DIGIT Left 12/31/2018   Procedure: REVISION AMPUTATION OF LEFT INDEX FINGER, IRRIGATION AND DEBRIDEMENT LEFT INDEX FINGER;  Surgeon: Verner Mould, MD;  Location: [place];  Service: Orthopedics;  Laterality: Left;  . RADIOLOGY WITH ANESTHESIA N/A 07/07/2014   Procedure: EMBOLIZATION;  Surgeon: Rob Hickman, MD;  Location: [place];  Service: Radiology;  Laterality: N/A;  . TOTAL KNEE ARTHROPLASTY Left 07/06/2013   Procedure: LEFT TOTAL KNEE ARTHROPLASTY;  Surgeon: Gearlean Alf, MD;  Location: [facility];  Service: Orthopedics;  Laterality: Left;  . TOTAL KNEE ARTHROPLASTY Right 01/18/2014   Procedure: RIGHT TOTAL KNEE ARTHROPLASTY;  Surgeon: Gearlean Alf, MD;  Location: [facility];  Service: Orthopedics;  Laterality: Right;  . TRIGGER FINGER RELEASE  2003   (thumb) middle finger (2006)    Prior to Admission medications   Medication Sig Start Date End Date Taking? Authorizing Provider  acetaminophen (TYLENOL) 325 MG tablet Take 325 mg by mouth as needed.   Yes [provider]  azelastine (ASTELIN) 0.1 % nasal spray 1-2 puffs each nostril every 8 hours if needed for drainage Patient taking differently: as needed. 1-2 puffs each nostril every  8 hours if needed for drainage 12/17/17  Yes Young, Tarri Fuller D, MD  budesonide-formoterol [facility]) 160-4.5 MCG/ACT inhaler Inhale 2 puffs then rinse mouth twice daily 12/23/19  Yes Young, Clinton D, MD  Calcium Carb-Vit D-C-E-Mineral (OS-CAL ULTRA) 600 MG TABS Take 1 tablet by mouth daily.    Yes [provider]  diclofenac Sodium (VOLTAREN) 1 % GEL Apply 2 g topically daily as needed (For pain). 03/08/20  Yes [provider]  esomeprazole (NEXIUM) 40 MG capsule Take 80 mg by  mouth 2 (two) times daily before a meal.   Yes [provider]  esomeprazole (NEXIUM) 40 MG capsule Take 1 capsule (40 mg total) by mouth 2 (two) times daily before a meal. 12/27/20   Mazella Deen, Cristopher Estimable, MD  fexofenadine (ALLEGRA) 180 MG tablet Take 180 mg by mouth daily as needed for allergies.   Yes [provider]  fluticasone (FLONASE) 50 MCG/ACT nasal spray Place 2 sprays into both nostrils daily. Patient taking differently: Place 2 sprays into both nostrils daily as needed for allergies. 05/03/16  Yes Young, Tarri Fuller D, MD  furosemide (LASIX) 20 MG tablet Take 1 tablet (20 mg total) by mouth daily. 11/28/20  Yes Shah, Pratik D, DO  hydrALAZINE (APRESOLINE) 50 MG tablet Take 1.5 tablets (75 mg total) by mouth 3 (three) times daily. 01/01/20  Yes Strader, [place] M, PA-C  latanoprost (XALATAN) 0.005 % ophthalmic solution Place 1 drop into both eyes at bedtime.  09/01/12  Yes [provider]  LORazepam (ATIVAN) 1 MG tablet Take 1 tablet by mouth three times daily as needed Patient taking differently: Take 1 mg by mouth 3 (three) times daily as needed for anxiety. 07/14/20  Yes Young, Tarri Fuller D, MD  losartan (COZAAR) 100 MG tablet Take 100 mg by mouth every morning.    Yes [provider]  metoprolol succinate (TOPROL-XL) 25 MG 24 hr tablet Take 25 mg by mouth daily. 1/2 tablet daily   Yes [provider]  Multiple Vitamins-Minerals (CENTRUM SILVER PO) Take 1 tablet by mouth daily.    Yes [provider]  nitrofurantoin, macrocrystal-monohydrate, (MACROBID) 100 MG capsule Take 100 mg by mouth 2 (two) times daily.   Yes [provider]  Polyethyl Glycol-Propyl Glycol (SYSTANE OP) Place 1 drop into both eyes daily.   Yes [provider]  polyethylene glycol (MIRALAX / GLYCOLAX) packet Take 17 g by mouth at bedtime.   Yes [provider]  potassium chloride SA (KLOR-CON) 20 MEQ tablet Take 1 tablet (20 mEq total) by mouth 2 (two)  times daily. 09/01/20  Yes Verta Ellen., NP  pravastatin (PRAVACHOL) 40 MG tablet Take 1 tablet by mouth at bedtime.  10/05/14  Yes [provider]  PROAIR HFA 108 (90 Base) MCG/ACT inhaler INHALE 2 PUFFS BY MOUTH EVERY 6 HOURS AS NEEDED FOR WHEEZING FOR SHORTNESS OF BREATH Patient taking differently: Inhale 1 puff into the lungs every 6 (six) hours as needed. 03/18/20  Yes Young, Tarri Fuller D, MD  sodium chloride 1 g tablet Take 1 tablet (1 g total) by mouth 3 (three) times daily with meals. 11/28/20 12/28/20 Yes Shah, Pratik D, DO  Wheat Dextrin (BENEFIBER) POWD Take 1 packet by mouth 2 (two) times daily.   Yes [provider]  Zinc 25 MG TABS Take 1 tablet by mouth daily.   Yes [provider]  zolpidem (AMBIEN) 10 MG tablet Take 1 tablet by mouth at bedtime.  01/24/16  Yes [provider]  alfuzosin (UROXATRAL) 10 MG 24 hr tablet Take 1 tablet (10 mg total) by mouth at bedtime. Patient not taking: Reported on 12/27/2020 09/05/20   Cleon Gustin, MD    Allergies as of 12/27/2020 - Review Complete 12/27/2020  Allergen Reaction Noted  . Bactrim [sulfamethoxazole-trimethoprim]  12/27/2020  . Prednisone  12/17/2017  . Codeine Other (See Comments)   . Tape Other (See Comments) 04/10/2012    Family History  Problem Relation Age of Onset  . Cancer Father        oral cancer  . Breast cancer Mother   . Heart attack Mother   . Hypertension Sister        Bypass x4  . Alcohol abuse Sister   . Alzheimer's disease Sister   . Kidney disease Sister   . Colon cancer Neg Hx     Social History   Socioeconomic History  . Marital status: Married    Spouse name: Not on file  . Number of children: Not on file  . Years of education: Not on file  . Highest education level: Not on file  Occupational History  . Not on file  Tobacco Use  . Smoking status: Former Smoker    Packs/day: 1.50    Years: 30.00    Pack years: 45.00    Types: Cigarettes     Start date: 04/24/1957    Quit date: 10/15/1986    Years since quitting: 34.2  . Smokeless tobacco: Never Used  Substance and Sexual Activity  . Alcohol use: No    Alcohol/week: 0.0 standard drinks    Comment: Used to drink heavily at times  . Drug use: No  . Sexual activity: Not on file  Other Topics Concern  . Not on file  Social History Narrative   Co-dependent relationship with his 44 year old son who has drug and financial problems   Recently married to [place] on 01/02/2014   Social Determinants of Health   Financial Resource Strain: Not on file  Food Insecurity: Not on file  Transportation Needs: Not on file  Physical Activity: Not on file  Stress: Not on file  Social Connections: Not on file  Intimate Partner Violence: Not on file    Review of Systems: See HPI, otherwise negative ROS  Physical Exam: BP (!) 149/83   Pulse 87   Temp (!) 97.1 F (36.2 C) (Temporal)   Ht 5\' 9"  (1.753 m)   Wt 237 lb 9.6 oz (107.8 kg)   BMI 35.09 kg/m  General:   Alert, frail-appearing 86 year old gentleman accompanied by his wife., pleasant and cooperative in NAD enopathy. Lungs:  Clear throughout to auscultation.   No wheezes, crackles, or rhonchi. No acute distress. Heart:  Regular rate and rhythm; no murmurs, clicks, rubs,  or gallops. Abdomen: Non-distended, normal bowel sounds.  Soft and nontender without appreciable mass or hepatosplenomegaly.   Impression/Plan: Pleasant 86 year old gentleman with GERD and constipation.  Recently admitted to the hospital after recurrent falls.  Found to be profoundly hyponatremic and in atrial fibrillation.  Is not yet anticoagulated for fear of recurrent falls.  History of GERD well-controlled.  Well-controlled.  Recent abdominal pain associate with constipation.  Back on Benefiber and MiraLAX has worked very well for bowel function and has been associated with resolution of his abdominal pain.  Recommendations:  Decrease Esomeprazole to 40 mg  twice daily (RX for generic esomeprazole 40 mg BID - Disp 60 with 5 refills - [facility] [place])  Continue Miralax 1 capful  daily  to 2x daily as needed  Continue Benefiber 2 tablespoons daily  Office visit with [place] in 6 months        Notice: This dictation was prepared with Dragon dictation along with smaller phrase technology. Any transcriptional errors that result from this process are unintentional and may not be corrected upon review.

## 2020-12-29 ENCOUNTER — Other Ambulatory Visit: Payer: Self-pay

## 2020-12-29 ENCOUNTER — Ambulatory Visit (INDEPENDENT_AMBULATORY_CARE_PROVIDER_SITE_OTHER): Payer: Medicare Other

## 2020-12-29 ENCOUNTER — Telehealth: Payer: Self-pay

## 2020-12-29 DIAGNOSIS — N401 Enlarged prostate with lower urinary tract symptoms: Secondary | ICD-10-CM | POA: Diagnosis not present

## 2020-12-29 DIAGNOSIS — N138 Other obstructive and reflux uropathy: Secondary | ICD-10-CM

## 2020-12-29 DIAGNOSIS — N3281 Overactive bladder: Secondary | ICD-10-CM

## 2020-12-29 LAB — BLADDER SCAN AMB NON-IMAGING: Scan Result: 25

## 2020-12-29 NOTE — Progress Notes (Signed)
Bladder Scan Patient can void: 25 ml  Performed By: Durenda Guthrie, lpn

## 2020-12-29 NOTE — Telephone Encounter (Signed)
Pts wife called saying pt was having difficulty urinating. She said sometimes he could not urinate at all and sometimes he was having frequency and could not stop. He is coming in for a nurse visit with [facility] to begin with.

## 2020-12-30 ENCOUNTER — Other Ambulatory Visit: Payer: Self-pay

## 2020-12-30 LAB — URINALYSIS, ROUTINE W REFLEX MICROSCOPIC
Bilirubin, UA: NEGATIVE
Glucose, UA: NEGATIVE
Ketones, UA: NEGATIVE
Nitrite, UA: NEGATIVE
Protein,UA: NEGATIVE
RBC, UA: NEGATIVE
Specific Gravity, UA: 1.01 (ref 1.005–1.030)
Urobilinogen, Ur: 0.2 mg/dL (ref 0.2–1.0)
pH, UA: 6 (ref 5.0–7.5)

## 2020-12-30 LAB — MICROSCOPIC EXAMINATION
Epithelial Cells (non renal): NONE SEEN /hpf (ref 0–10)
RBC, Urine: NONE SEEN /hpf (ref 0–2)
Renal Epithel, UA: NONE SEEN /hpf

## 2020-12-30 NOTE — Telephone Encounter (Signed)
Informed by wife that patient had not taken his alfuzosin since he had came home from the hospital. Per Dr. Alyson Ingles patient is to start taking his alfuzosin nightly and to complete the course of antibiotics from his PCP. Wife voiced understanding.

## 2021-01-02 DIAGNOSIS — L110 Acquired keratosis follicularis: Secondary | ICD-10-CM | POA: Diagnosis not present

## 2021-01-02 DIAGNOSIS — M79674 Pain in right toe(s): Secondary | ICD-10-CM | POA: Diagnosis not present

## 2021-01-02 DIAGNOSIS — M79671 Pain in right foot: Secondary | ICD-10-CM | POA: Diagnosis not present

## 2021-01-02 DIAGNOSIS — M2041 Other hammer toe(s) (acquired), right foot: Secondary | ICD-10-CM | POA: Diagnosis not present

## 2021-01-09 ENCOUNTER — Other Ambulatory Visit: Payer: Self-pay | Admitting: *Deleted

## 2021-01-09 DIAGNOSIS — I10 Essential (primary) hypertension: Secondary | ICD-10-CM | POA: Diagnosis not present

## 2021-01-09 DIAGNOSIS — J920 Pleural plaque with presence of asbestos: Secondary | ICD-10-CM | POA: Diagnosis not present

## 2021-01-09 DIAGNOSIS — E785 Hyperlipidemia, unspecified: Secondary | ICD-10-CM | POA: Diagnosis not present

## 2021-01-09 DIAGNOSIS — I4819 Other persistent atrial fibrillation: Secondary | ICD-10-CM | POA: Diagnosis not present

## 2021-01-09 DIAGNOSIS — E871 Hypo-osmolality and hyponatremia: Secondary | ICD-10-CM | POA: Diagnosis not present

## 2021-01-09 MED ORDER — HYDRALAZINE HCL 50 MG PO TABS: 75.0000 mg | ORAL_TABLET | Freq: Three times a day (TID) | ORAL | 3 refills | Status: DC

## 2021-01-10 ENCOUNTER — Other Ambulatory Visit: Payer: Self-pay | Admitting: Internal Medicine

## 2021-01-11 NOTE — Telephone Encounter (Signed)
Lorazepam refilled at his [facility]

## 2021-01-23 DIAGNOSIS — R7301 Impaired fasting glucose: Secondary | ICD-10-CM | POA: Diagnosis not present

## 2021-01-23 DIAGNOSIS — I10 Essential (primary) hypertension: Secondary | ICD-10-CM | POA: Diagnosis not present

## 2021-01-23 DIAGNOSIS — Z125 Encounter for screening for malignant neoplasm of prostate: Secondary | ICD-10-CM | POA: Diagnosis not present

## 2021-01-23 DIAGNOSIS — E876 Hypokalemia: Secondary | ICD-10-CM | POA: Diagnosis not present

## 2021-01-23 DIAGNOSIS — E871 Hypo-osmolality and hyponatremia: Secondary | ICD-10-CM | POA: Diagnosis not present

## 2021-01-26 DIAGNOSIS — I4819 Other persistent atrial fibrillation: Secondary | ICD-10-CM | POA: Diagnosis not present

## 2021-01-26 DIAGNOSIS — I10 Essential (primary) hypertension: Secondary | ICD-10-CM | POA: Diagnosis not present

## 2021-01-26 DIAGNOSIS — F5221 Male erectile disorder: Secondary | ICD-10-CM | POA: Diagnosis not present

## 2021-01-26 DIAGNOSIS — J4520 Mild intermittent asthma, uncomplicated: Secondary | ICD-10-CM | POA: Diagnosis not present

## 2021-01-26 DIAGNOSIS — N390 Urinary tract infection, site not specified: Secondary | ICD-10-CM | POA: Diagnosis not present

## 2021-01-26 DIAGNOSIS — E871 Hypo-osmolality and hyponatremia: Secondary | ICD-10-CM | POA: Diagnosis not present

## 2021-01-26 DIAGNOSIS — Z6837 Body mass index (BMI) 37.0-37.9, adult: Secondary | ICD-10-CM | POA: Diagnosis not present

## 2021-01-26 DIAGNOSIS — I701 Atherosclerosis of renal artery: Secondary | ICD-10-CM | POA: Diagnosis not present

## 2021-01-26 DIAGNOSIS — I671 Cerebral aneurysm, nonruptured: Secondary | ICD-10-CM | POA: Diagnosis not present

## 2021-01-26 DIAGNOSIS — I350 Nonrheumatic aortic (valve) stenosis: Secondary | ICD-10-CM | POA: Diagnosis not present

## 2021-01-29 NOTE — Progress Notes (Signed)
HPI male former smoker followed for allergic rhinitis, asthma,  history OSA, asbestos exposure/plaques, chronic insomnia Office spirometry 05/03/15- WNL- FVC 3.33/ 78%, FEV1 2.49/ 78%, r 0.75, FEF25-75% 1.92/ 69%.. ----------------------------------------------------------------------------------------------- 06/24/20- 86 year old male Former Smoker followed for allergic rhinitis, Asthma,  history OSA/ weight loss, Asbestos exposure/plaques/ interstitial lung disease, chronic insomnia, Diverticulitis, SVT, HTN, GERD,  Body weight today-  Had 2 Moderna covax  Has seen neurology for neuralgia R ear> nortriptyline Symbicort 160, Proair hfa Not needing rescue often. No cough.   01/30/21-  86 year old male Former Smoker followed for allergic Rhinitis, Asthma,  history OSA/ weight loss, Asbestos exposure/plaques/ interstitial lung disease, chronic insomnia, Diverticulitis, SVT, AFib, HTN, GERD, Hyponatremia, -Azelastine nasal, Symbicort 160, Flonase, ProAir hfa,  Hosp in Feb with hyponatremia and new-onset AFib (no anticoag due to falls risk), rib fxs. Covid vax-3 Phizer Flu vax-had Being followed by PCP and Renal in [place] for sodium.  ACT score - 19 Breathing stable with current inhalers. Denies acute respiratory issue after recovering from fall with rib fx's. CT shows plaques and ILD c/w old asbestos exposure, but not progressive. CT chest/abd/pelvis 11/21/20- IMPRESSION: 1. Several nondisplaced left anterior rib fractures. No pneumothorax. 2. No acute/traumatic intra-abdominal or pelvic pathology. 3. Aortic Atherosclerosis (ICD10-I70.0).   ROS See HPI  + = positive Constitutional:   No-   weight loss, night sweats, fevers, chills, fatigue, lassitude. HEENT:   + headaches,  No -difficulty swallowing, tooth/dental problems, sore throat,       No-  sneezing, itching, ear ache,  +nasal congestion, +post nasal drip,  CV:  + chest pain, orthopnea, PND, swelling in lower extremities,  no-anasarca, dizziness, palpitations Resp: No-   shortness of breath with exertion or at rest.                productive cough,  No non-productive cough,  No-  coughing up of blood.              No-   change in color of mucus.  No- wheezing.   Skin: No-   rash or lesions. GI:  No-   heartburn, indigestion, abdominal pain, nausea, vomiting,             GU:  MS:  + joint pain or swelling. .  + back pain. Neuro-  nothing unusual  Psych:  No- change in mood or affect. No depression or anxiety.  No memory loss.  OBJ  General- Alert, Oriented, Affect-appropriate, Distress- none acute, + obese, +talkative  Skin- rash-none, lesions- none, excoriation- none,  Lymphadenopathy- none Head- atraumatic            Eyes- Gross vision intact, PERRLA, conjunctivae clear secretions            Ears- +hard of hearing/hearing aid            Nose- + turbinate edema, no-Septal dev, mucus, polyps, erosion, perforation             Throat- Mallampati II , mucosa clear , drainage- none, tonsils- atrophic. Dental repair. Neck- flexible , trachea midline, no stridor , thyroid nl, carotid no bruit Chest - symmetrical excursion , unlabored           Heart/CV-  RRR , +2/6 S murmur , no gallop  , no rub, nl s1 s2                           - JVD- none , edema + chronic right  lower leg, varices- none           Lungs-  dullness-none, rub- none.            Chest wall- no rub heard Abd-  Br/ Gen/ Rectal- Not done, not indicated Extrem-  Neuro- grossly intact to observation. Speech is clear.

## 2021-01-30 ENCOUNTER — Encounter: Payer: Self-pay | Admitting: Internal Medicine

## 2021-01-30 ENCOUNTER — Other Ambulatory Visit: Payer: Self-pay

## 2021-01-30 ENCOUNTER — Ambulatory Visit (INDEPENDENT_AMBULATORY_CARE_PROVIDER_SITE_OTHER): Payer: Medicare Other | Admitting: Internal Medicine

## 2021-01-30 DIAGNOSIS — G4733 Obstructive sleep apnea (adult) (pediatric): Secondary | ICD-10-CM | POA: Diagnosis not present

## 2021-01-30 DIAGNOSIS — I701 Atherosclerosis of renal artery: Secondary | ICD-10-CM

## 2021-01-30 DIAGNOSIS — J61 Pneumoconiosis due to asbestos and other mineral fibers: Secondary | ICD-10-CM

## 2021-01-30 NOTE — Patient Instructions (Signed)
Use your Symbicort maintenance inhaler 2 puffs then rinse mouth, twice daily every day  Use your Proair rescue inhaler only if needed- carry it with you.  Inhale 2 puffs up to every 6 hours, if needed for wheeze and shortness of  Breath.  Please call if we can help

## 2021-01-31 ENCOUNTER — Other Ambulatory Visit: Payer: Self-pay | Admitting: Internal Medicine

## 2021-01-31 ENCOUNTER — Encounter: Payer: Self-pay | Admitting: Cardiology

## 2021-01-31 NOTE — Progress Notes (Signed)
Cardiology Office Note  Date: 02/01/2021   ID: CHRISOPHER PUSTEJOVSKY, DOB [birth year], MRN [id number]  PCP:  [name], MD  Cardiologist:  Rozann Lesches, MD Electrophysiologist:  None   Chief Complaint  Patient presents with  . Cardiac follow-up    History of Present Illness: Julian Fowler is an 86 y.o. male last seen in March by Ms. Ingold NP.  He is here today with his wife for a follow-up visit.  He does not report any active sense of palpitations.  He states that leg edema has been reasonably well controlled on low-dose Lasix.  He continues on potassium supplements and also sodium tablets with history of hyponatremia.  Recently sodium reportedly 135 per Dr. Pleas Koch.  He was taken off diltiazem CD at the last visit due to bradycardia.  Also not anticoagulated in light of falls.  CHA2DS2-VASc score is 4.  He does remain on Toprol-XL, I reviewed recent limited cardiac monitor.  I personally reviewed his ECG today which shows atrial fibrillation at 59 bpm.  We did discuss stroke prophylaxis today.  His wife tells me that he is very prone to falls, he confirms this.  He is concerned about bleeding risk and is hesitant to pursue initiation of DOAC at this time, I am in agreement after our discussion today.  Past Medical History:  Diagnosis Date  . Allergic rhinitis   . Aortic stenosis   . Arthritis   . Atrial fibrillation ([place])   . BPH (benign prostatic hyperplasia)   . Coronary artery calcification seen on CT scan   . Easy bruisability   . ED (erectile dysfunction)   . Essential hypertension   . GERD (gastroesophageal reflux disease)   . H/O hiatal hernia   . Hearing loss in left ear   . History of asbestosis   . History of shingles   . Hyperlipidemia   . Overactive bladder   . PSVT (paroxysmal supraventricular tachycardia) ([place])   . Sleep apnea    No longer on CPAP following weight loss  . Tinnitus     Past Surgical History:  Procedure Laterality Date  . BACK  SURGERY  2014   herniated L1, L2   Dr Carloyn Manner  . BRAIN SURGERY    . CEREBRAL EMBOLIZATION  12/2011   "radiation therapy-did not work"  . CHOLECYSTECTOMY  6/98  . COLONOSCOPY  09/2009   Dr. Lindalou Hose: normal, internal hemorrhoids   . COLONOSCOPY N/A 02/28/2017   Dr. Gala Romney: Hemorrhoids, grade 3, mild diverticulosis.  Marland Kitchen CRANIECTOMY FOR EXCISION OF ACOUSTIC NEUROMA  3/95  . MINOR AMPUTATION OF DIGIT Left 12/31/2018   Procedure: REVISION AMPUTATION OF LEFT INDEX FINGER, IRRIGATION AND DEBRIDEMENT LEFT INDEX FINGER;  Surgeon: Verner Mould, MD;  Location: [place];  Service: Orthopedics;  Laterality: Left;  . RADIOLOGY WITH ANESTHESIA N/A 07/07/2014   Procedure: EMBOLIZATION;  Surgeon: Rob Hickman, MD;  Location: [place];  Service: Radiology;  Laterality: N/A;  . TOTAL KNEE ARTHROPLASTY Left 07/06/2013   Procedure: LEFT TOTAL KNEE ARTHROPLASTY;  Surgeon: Gearlean Alf, MD;  [facility];  Service: Orthopedics;  Laterality: Left;  . TOTAL KNEE ARTHROPLASTY Right 01/18/2014   Procedure: RIGHT TOTAL KNEE ARTHROPLASTY;  Surgeon: Gearlean Alf, MD;  [facility];  Service: Orthopedics;  Laterality: Right;  . TRIGGER FINGER RELEASE  2003   (thumb) middle finger (2006)    Current Outpatient Medications  Medication Sig Dispense Refill  . acetaminophen (TYLENOL) 325 MG tablet  Take 325 mg by mouth as needed.    Marland Kitchen alfuzosin (UROXATRAL) 10 MG 24 hr tablet Take 1 tablet (10 mg total) by mouth at bedtime. 30 tablet 11  . azelastine (ASTELIN) 0.1 % nasal spray 1-2 puffs each nostril every 8 hours if needed for drainage (Patient taking differently: as needed. 1-2 puffs each nostril every 8 hours if needed for drainage) 30 mL 12  . Calcium Carb-Vit D-C-E-Mineral (OS-CAL ULTRA) 600 MG TABS Take 1 tablet by mouth daily.     . diclofenac Sodium (VOLTAREN) 1 % GEL Apply 2 g topically daily as needed (For pain).    Marland Kitchen esomeprazole (NEXIUM) 40 MG capsule Take 1 capsule (40 mg total) by mouth 2 (two)  times daily before a meal. 60 capsule 5  . fexofenadine (ALLEGRA) 180 MG tablet Take 180 mg by mouth daily as needed for allergies.    . fluticasone (FLONASE) 50 MCG/ACT nasal spray Place 2 sprays into both nostrils daily. (Patient taking differently: Place 2 sprays into both nostrils daily as needed for allergies.) 18.2 g 12  . furosemide (LASIX) 20 MG tablet Take 1 tablet (20 mg total) by mouth daily. 30 tablet 3  . hydrALAZINE (APRESOLINE) 50 MG tablet Take 1.5 tablets (75 mg total) by mouth 3 (three) times daily. 410 tablet 3  . latanoprost (XALATAN) 0.005 % ophthalmic solution Place 1 drop into both eyes at bedtime.     Marland Kitchen LORazepam (ATIVAN) 1 MG tablet Take 1 tablet (1 mg total) by mouth 3 (three) times daily as needed for anxiety. 90 tablet 5  . losartan (COZAAR) 100 MG tablet Take 100 mg by mouth every morning.     . metoprolol succinate (TOPROL-XL) 25 MG 24 hr tablet Take 25 mg by mouth daily. 1/2 tablet daily    . Multiple Vitamins-Minerals (CENTRUM SILVER PO) Take 1 tablet by mouth daily.     Vladimir Faster Glycol-Propyl Glycol (SYSTANE OP) Place 1 drop into both eyes daily.    . polyethylene glycol (MIRALAX / GLYCOLAX) packet Take 17 g by mouth at bedtime.    . potassium chloride SA (KLOR-CON) 20 MEQ tablet Take 1 tablet (20 mEq total) by mouth 2 (two) times daily. 180 tablet 1  . pravastatin (PRAVACHOL) 40 MG tablet Take 1 tablet by mouth at bedtime.     Marland Kitchen PROAIR HFA 108 (90 Base) MCG/ACT inhaler INHALE 2 PUFFS BY MOUTH EVERY 6 HOURS AS NEEDED FOR WHEEZING FOR SHORTNESS OF BREATH (Patient taking differently: Inhale 1 puff into the lungs every 6 (six) hours as needed.) 18 g 12  . SYMBICORT 160-4.5 MCG/ACT inhaler INHALE 2 PUFFS BY MOUTH  TWICE DAILY THEN  RINSE  MOUTH 11 g 6  . Wheat Dextrin (BENEFIBER) POWD Take 1 packet by mouth 2 (two) times daily.    . Zinc 25 MG TABS Take 1 tablet by mouth daily.    Marland Kitchen zolpidem (AMBIEN) 10 MG tablet Take 1 tablet by mouth at bedtime.      No current  facility-administered medications for this visit.   Allergies:  Bactrim [sulfamethoxazole-trimethoprim], Prednisone, Codeine, and Tape   ROS: Hearing loss.  Physical Exam: VS:  BP (!) 158/90   Pulse 60   Ht 5\' 9"  (1.753 m)   Wt 250 lb 12.8 oz (113.8 kg)   SpO2 98%   BMI 37.04 kg/m , BMI Body mass index is 37.04 kg/m.  Wt Readings from Last 3 Encounters:  02/01/21 250 lb 12.8 oz (113.8 kg)  01/30/21 248 lb  3.2 oz (112.6 kg)  12/27/20 237 lb 9.6 oz (107.8 kg)    General: Elderly male, appears comfortable at rest. HEENT: Conjunctiva and lids normal, wearing a mask. Neck: Supple, no elevated JVP or carotid bruits, no thyromegaly. Lungs: Clear to auscultation, nonlabored breathing at rest. Cardiac: Irregularly irregular, no S3, soft systolic murmur. Abdomen: Protuberant, bowel sounds present. Extremities: Mild lower leg edema.  ECG:  An ECG dated 12/15/2020 was personally reviewed today and demonstrated:  Atrial fibrillation with slow ventricular response, QRS duration 120 ms.  Recent Labwork: 11/22/2020: ALT 76; AST 37; TSH 0.238 11/28/2020: BUN 10; Creatinine, Ser 0.68; Hemoglobin 11.3; Magnesium 1.8; Platelets 450; Potassium 3.9; Sodium 126     Component Value Date/Time   CHOL 182 01/06/2009 2252   TRIG 128 01/06/2009 2252   HDL 44 01/06/2009 2252   CHOLHDL 4.1 Ratio 01/06/2009 2252   VLDL 26 01/06/2009 2252   LDLCALC 112 (H) 01/06/2009 2252    Other Studies Reviewed Today:  Echocardiogram 11/22/2020: 1. Left ventricular ejection fraction, by estimation, is 60 to 65%. The  left ventricle has normal function. The left ventricle has no regional  wall motion abnormalities. There is moderate left ventricular hypertrophy.  Left ventricular diastolic  parameters are indeterminate.  2. Right ventricular systolic function is normal. The right ventricular  size is normal.  3. The mitral valve is normal in structure. No evidence of mitral valve  regurgitation. No evidence of  mitral stenosis.  4. The aortic valve is tricuspid. There is moderate calcification of the  aortic valve. There is moderate thickening of the aortic valve. Aortic  valve regurgitation is not visualized. Mild aortic valve stenosis. Aortic  valve mean gradient measures 10.3  mmHg. Aortic valve peak gradient measures 18.8 mmHg. Aortic valve area, by  VTI measures 1.78 cm.  5. The inferior vena cava is normal in size with greater than 50%  respiratory variability, suggesting right atrial pressure of 3 mmHg.   Cardiac monitor 12/23/2020: ZIO XT reviewed.  2 days 18 hours analyzed.  Predominant rhythm is atrial fibrillation with heart rate ranging from 47 bpm up to 184 bpm and average heart rate 80 bpm.  Rare PVCs were noted representing less than 1% total beats.  Assessment and Plan:  1.  Persistent/permanent atrial fibrillation with plan for heart rate control strategy.  Continue Toprol-XL at current dose.  CHA2DS2-VASc score is 4, however as per discussion today with concerns about frequent falls and risk for bleeding, we will hold off on initiation of DOAC.  Patient and wife are in agreement.  2.  Mild aortic stenosis, mean gradient 10 mmHg.  He is asymptomatic.  3.  Multivessel coronary artery calcification by CT imaging.  No active angina symptoms at this time.  He is on Pravachol.  Medication Adjustments/Labs and Tests Ordered: Current medicines are reviewed at length with the patient today.  Concerns regarding medicines are outlined above.   Tests Ordered: Orders Placed This Encounter  Procedures  . EKG 12-Lead    Medication Changes: No orders of the defined types were placed in this encounter.   Disposition:  Follow up 3 months.  Signed, Satira Sark, MD, [facility] 02/01/2021 10:39 AM    [facility] [place], [place], [place] [address] Phone: [phone]; Fax: [phone]

## 2021-02-01 ENCOUNTER — Encounter: Payer: Self-pay | Admitting: *Deleted

## 2021-02-01 ENCOUNTER — Encounter: Payer: Self-pay | Admitting: Cardiology

## 2021-02-01 ENCOUNTER — Ambulatory Visit (INDEPENDENT_AMBULATORY_CARE_PROVIDER_SITE_OTHER): Payer: Medicare Other | Admitting: Cardiology

## 2021-02-01 VITALS — BP 158/90 | HR 60 | Ht 69.0 in | Wt 250.8 lb

## 2021-02-01 DIAGNOSIS — I701 Atherosclerosis of renal artery: Secondary | ICD-10-CM | POA: Diagnosis not present

## 2021-02-01 DIAGNOSIS — I350 Nonrheumatic aortic (valve) stenosis: Secondary | ICD-10-CM | POA: Diagnosis not present

## 2021-02-01 DIAGNOSIS — I2510 Atherosclerotic heart disease of native coronary artery without angina pectoris: Secondary | ICD-10-CM | POA: Diagnosis not present

## 2021-02-01 DIAGNOSIS — I2584 Coronary atherosclerosis due to calcified coronary lesion: Secondary | ICD-10-CM | POA: Diagnosis not present

## 2021-02-01 DIAGNOSIS — I4819 Other persistent atrial fibrillation: Secondary | ICD-10-CM | POA: Diagnosis not present

## 2021-02-01 NOTE — Patient Instructions (Addendum)
Medication Instructions:   Your physician recommends that you continue on your current medications as directed. Please refer to the Current Medication list given to you today.  Labwork:  none  Testing/Procedures:  none  Follow-Up:  Your physician recommends that you schedule a follow-up appointment in: 3 months.  Any Other Special Instructions Will Be Listed Below (If Applicable).  If you need a refill on your cardiac medications before your next appointment, please call your pharmacy. 

## 2021-02-14 DIAGNOSIS — M79671 Pain in right foot: Secondary | ICD-10-CM | POA: Diagnosis not present

## 2021-02-14 DIAGNOSIS — L110 Acquired keratosis follicularis: Secondary | ICD-10-CM | POA: Diagnosis not present

## 2021-02-14 DIAGNOSIS — M79674 Pain in right toe(s): Secondary | ICD-10-CM | POA: Diagnosis not present

## 2021-02-14 DIAGNOSIS — M79672 Pain in left foot: Secondary | ICD-10-CM | POA: Diagnosis not present

## 2021-02-14 DIAGNOSIS — I739 Peripheral vascular disease, unspecified: Secondary | ICD-10-CM | POA: Diagnosis not present

## 2021-02-14 DIAGNOSIS — M79675 Pain in left toe(s): Secondary | ICD-10-CM | POA: Diagnosis not present

## 2021-02-15 ENCOUNTER — Ambulatory Visit: Payer: Medicare Other | Admitting: Urology

## 2021-02-15 DIAGNOSIS — N138 Other obstructive and reflux uropathy: Secondary | ICD-10-CM

## 2021-02-23 DIAGNOSIS — R300 Dysuria: Secondary | ICD-10-CM | POA: Diagnosis not present

## 2021-02-27 ENCOUNTER — Telehealth: Payer: Self-pay

## 2021-02-27 DIAGNOSIS — I10 Essential (primary) hypertension: Secondary | ICD-10-CM | POA: Diagnosis not present

## 2021-02-27 NOTE — Telephone Encounter (Signed)
Patient has June appointment. Needing a sooner appointment with doctor due to frequent urination, elevated PSA, and swollen prostate.  Please advise.  Call back:  Wife - Lucita Ferrara [phone]  Thanks, Helene Kelp

## 2021-02-28 NOTE — Telephone Encounter (Signed)
Appointment scheduled with wife to see MD on 05/18

## 2021-03-01 ENCOUNTER — Ambulatory Visit (INDEPENDENT_AMBULATORY_CARE_PROVIDER_SITE_OTHER): Payer: Medicare Other | Admitting: Urology

## 2021-03-01 ENCOUNTER — Encounter: Payer: Self-pay | Admitting: Urology

## 2021-03-01 ENCOUNTER — Other Ambulatory Visit: Payer: Self-pay

## 2021-03-01 VITALS — Ht 69.0 in | Wt 252.0 lb

## 2021-03-01 DIAGNOSIS — I701 Atherosclerosis of renal artery: Secondary | ICD-10-CM | POA: Diagnosis not present

## 2021-03-01 DIAGNOSIS — N3281 Overactive bladder: Secondary | ICD-10-CM

## 2021-03-01 DIAGNOSIS — R3912 Poor urinary stream: Secondary | ICD-10-CM | POA: Diagnosis not present

## 2021-03-01 DIAGNOSIS — N401 Enlarged prostate with lower urinary tract symptoms: Secondary | ICD-10-CM | POA: Diagnosis not present

## 2021-03-01 DIAGNOSIS — N138 Other obstructive and reflux uropathy: Secondary | ICD-10-CM | POA: Diagnosis not present

## 2021-03-01 MED ORDER — GEMTESA 75 MG PO TABS: 1.0000 | ORAL_TABLET | Freq: Every day | ORAL | 0 refills | Status: DC

## 2021-03-01 NOTE — Progress Notes (Signed)
03/01/2021 1:28 PM   Julian Fowler 1935-05-05 [id number]  Referring provider: [name], MD [place],  [place] [address]  followup BPH  HPI: Mr Granquist is a 86yo here for followup for BPH with weak urinary stream and urinary frequency. He takes his lasix at 11am and he urinates every 15-20 minutes until 10pm at night. If he does not take the lasix his urinary frequency is unchanged. PVR 28cc. He has occasional urge incontinence. He stopped mirabegron 25mg  since it was not improving his urinary frequency. He is currently on uroxatral 10mg  qhs. Urine stream is strong. IPSS 15 QOL 6.   PSA increased to 6 from 3 last year. He was treated twice recently for a UTI.     PMH: Past Medical History:  Diagnosis Date  . Allergic rhinitis   . Aortic stenosis   . Arthritis   . Atrial fibrillation ([place])   . BPH (benign prostatic hyperplasia)   . Coronary artery calcification seen on CT scan   . Easy bruisability   . ED (erectile dysfunction)   . Essential hypertension   . GERD (gastroesophageal reflux disease)   . H/O hiatal hernia   . Hearing loss in left ear   . History of asbestosis   . History of shingles   . Hyperlipidemia   . Overactive bladder   . PSVT (paroxysmal supraventricular tachycardia) ([place])   . Sleep apnea    No longer on CPAP following weight loss  . Tinnitus     Surgical History: Past Surgical History:  Procedure Laterality Date  . BACK SURGERY  2014   herniated L1, L2   Dr Carloyn Manner  . BRAIN SURGERY    . CEREBRAL EMBOLIZATION  12/2011   "radiation therapy-did not work"  . CHOLECYSTECTOMY  6/98  . COLONOSCOPY  09/2009   Dr. Lindalou Hose: normal, internal hemorrhoids   . COLONOSCOPY N/A 02/28/2017   Dr. Gala Romney: Hemorrhoids, grade 3, mild diverticulosis.  Marland Kitchen CRANIECTOMY FOR EXCISION OF ACOUSTIC NEUROMA  3/95  . MINOR AMPUTATION OF DIGIT Left 12/31/2018   Procedure: REVISION AMPUTATION OF LEFT INDEX FINGER, IRRIGATION AND DEBRIDEMENT LEFT INDEX FINGER;   Surgeon: Verner Mould, MD;  Location: [place];  Service: Orthopedics;  Laterality: Left;  . RADIOLOGY WITH ANESTHESIA N/A 07/07/2014   Procedure: EMBOLIZATION;  Surgeon: Rob Hickman, MD;  Location: [place];  Service: Radiology;  Laterality: N/A;  . TOTAL KNEE ARTHROPLASTY Left 07/06/2013   Procedure: LEFT TOTAL KNEE ARTHROPLASTY;  Surgeon: Gearlean Alf, MD;  [facility];  Service: Orthopedics;  Laterality: Left;  . TOTAL KNEE ARTHROPLASTY Right 01/18/2014   Procedure: RIGHT TOTAL KNEE ARTHROPLASTY;  Surgeon: Gearlean Alf, MD;  [facility];  Service: Orthopedics;  Laterality: Right;  . TRIGGER FINGER RELEASE  2003   (thumb) middle finger (2006)    Home Medications:  Allergies as of 03/01/2021      Reactions   Bactrim [sulfamethoxazole-trimethoprim]    Prednisone    Pt is high functioning and out of sorts.   Codeine Other (See Comments)   REACTION: groggy   Tape Other (See Comments)   Skin tears, use paper tape      Medication List       Accurate as of Mar 01, 2021  1:28 PM. If you have any questions, ask your nurse or doctor.        STOP taking these medications   amoxicillin 500 MG capsule Commonly known as: AMOXIL Stopped  by: Nicolette Bang, MD   nitrofurantoin (macrocrystal-monohydrate) 100 MG capsule Commonly known as: MACROBID Stopped by: Nicolette Bang, MD     TAKE these medications   acetaminophen 325 MG tablet Commonly known as: TYLENOL Take 325 mg by mouth as needed.   alfuzosin 10 MG 24 hr tablet Commonly known as: UROXATRAL Take 1 tablet (10 mg total) by mouth at bedtime.   azelastine 0.1 % nasal spray Commonly known as: ASTELIN 1-2 puffs each nostril every 8 hours if needed for drainage What changed:   when to take this  reasons to take this   Benefiber Powd Take 1 packet by mouth 2 (two) times daily.   CENTRUM SILVER PO Take 1 tablet by mouth daily.   diclofenac Sodium 1 % Gel Commonly known as: VOLTAREN Apply  2 g topically daily as needed (For pain).   esomeprazole 40 MG capsule Commonly known as: NexIUM Take 1 capsule (40 mg total) by mouth 2 (two) times daily before a meal.   fexofenadine 180 MG tablet Commonly known as: ALLEGRA Take 180 mg by mouth daily as needed for allergies.   fluticasone 50 MCG/ACT nasal spray Commonly known as: FLONASE Place 2 sprays into both nostrils daily. What changed:   when to take this  reasons to take this   furosemide 20 MG tablet Commonly known as: LASIX Take 1 tablet (20 mg total) by mouth daily.   hydrALAZINE 50 MG tablet Commonly known as: APRESOLINE Take 1.5 tablets (75 mg total) by mouth 3 (three) times daily.   latanoprost 0.005 % ophthalmic solution Commonly known as: XALATAN Place 1 drop into both eyes at bedtime.   LORazepam 1 MG tablet Commonly known as: ATIVAN Take 1 tablet (1 mg total) by mouth 3 (three) times daily as needed for anxiety.   losartan 100 MG tablet Commonly known as: COZAAR Take 100 mg by mouth every morning.   metoprolol succinate 25 MG 24 hr tablet Commonly known as: TOPROL-XL Take 25 mg by mouth daily. 1/2 tablet daily   ondansetron 4 MG tablet Commonly known as: ZOFRAN Take 4 mg by mouth every 8 (eight) hours as needed.   Os-Cal Ultra 600 MG Tabs Take 1 tablet by mouth daily.   polyethylene glycol 17 g packet Commonly known as: MIRALAX / GLYCOLAX Take 17 g by mouth at bedtime.   potassium chloride SA 20 MEQ tablet Commonly known as: KLOR-CON Take 1 tablet (20 mEq total) by mouth 2 (two) times daily.   pravastatin 40 MG tablet Commonly known as: PRAVACHOL Take 1 tablet by mouth at bedtime.   ProAir HFA 108 (90 Base) MCG/ACT inhaler Generic drug: albuterol INHALE 2 PUFFS BY MOUTH EVERY 6 HOURS AS NEEDED FOR WHEEZING FOR SHORTNESS OF BREATH What changed: See the new instructions.   sodium chloride 1 g tablet Take 1 g by mouth 3 (three) times daily.   Symbicort 160-4.5 MCG/ACT  inhaler Generic drug: budesonide-formoterol INHALE 2 PUFFS BY MOUTH  TWICE DAILY THEN  RINSE  MOUTH   SYSTANE OP Place 1 drop into both eyes daily.   Zinc 25 MG Tabs Take 1 tablet by mouth daily.   zolpidem 10 MG tablet Commonly known as: AMBIEN Take 1 tablet by mouth at bedtime.       Allergies:  Allergies  Allergen Reactions  . Bactrim [Sulfamethoxazole-Trimethoprim]   . Prednisone     Pt is high functioning and out of sorts.  . Codeine Other (See Comments)    REACTION: groggy  .  Tape Other (See Comments)    Skin tears, use paper tape    Family History: Family History  Problem Relation Age of Onset  . Cancer Father        oral cancer  . Breast cancer Mother   . Heart attack Mother   . Hypertension Sister        Bypass x4  . Alcohol abuse Sister   . Alzheimer's disease Sister   . Kidney disease Sister   . Colon cancer Neg Hx     Social History:  reports that he quit smoking about 34 years ago. His smoking use included cigarettes. He started smoking about 63 years ago. He has a 45.00 pack-year smoking history. He has never used smokeless tobacco. He reports that he does not drink alcohol and does not use drugs.  ROS: All other review of systems were reviewed and are negative except what is noted above in HPI  Physical Exam: Ht 5\' 9"  (1.753 m)   Wt 252 lb (114.3 kg)   BMI 37.21 kg/m   Constitutional:  Alert and oriented, No acute distress. HEENT: [place] AT, moist mucus membranes.  Trachea midline, no masses. Cardiovascular: No clubbing, cyanosis, or edema. Respiratory: Normal respiratory effort, no increased work of breathing. GI: Abdomen is soft, nontender, nondistended, no abdominal masses GU: No CVA tenderness.  Lymph: No cervical or inguinal lymphadenopathy. Skin: No rashes, bruises or suspicious lesions. Neurologic: Grossly intact, no focal deficits, moving all 4 extremities. Psychiatric: Normal mood and affect.  Laboratory Data: Lab Results   Component Value Date   WBC 7.1 11/28/2020   HGB 11.3 (L) 11/28/2020   HCT 32.9 (L) 11/28/2020   MCV 90.6 11/28/2020   PLT 450 (H) 11/28/2020    Lab Results  Component Value Date   CREATININE 0.68 11/28/2020    Lab Results  Component Value Date   PSA 3.60 01/06/2009   PSA 3.05 03/25/2007    No results found for: TESTOSTERONE  No results found for: HGBA1C  Urinalysis    Component Value Date/Time   COLORURINE STRAW (A) 11/21/2020 1230   APPEARANCEUR Clear 12/29/2020 1634   LABSPEC 1.005 11/21/2020 1230   PHURINE 7.0 11/21/2020 1230   GLUCOSEU Negative 12/29/2020 1634   HGBUR NEGATIVE 11/21/2020 1230   HGBUR negative 10/27/2008 0838   BILIRUBINUR Negative 12/29/2020 [place] 11/21/2020 1230   PROTEINUR Negative 12/29/2020 1634   PROTEINUR NEGATIVE 11/21/2020 1230   UROBILINOGEN 0.2 01/11/2014 1405   NITRITE Negative 12/29/2020 1634   NITRITE NEGATIVE 11/21/2020 1230   LEUKOCYTESUR Trace (A) 12/29/2020 1634   LEUKOCYTESUR NEGATIVE 11/21/2020 1230    Lab Results  Component Value Date   LABMICR See below: 12/29/2020   WBCUA 0-5 12/29/2020   LABEPIT None seen 12/29/2020   BACTERIA Few 12/29/2020    Pertinent Imaging:  No results found for this or any previous visit.  No results found for this or any previous visit.  No results found for this or any previous visit.  No results found for this or any previous visit.  No results found for this or any previous visit.  No results found for this or any previous visit.  No results found for this or any previous visit.  No results found for this or any previous visit.   Assessment & Plan:    1. Benign prostatic hyperplasia with urinary obstruction -continue uroxatral 10mg  qhs - BLADDER SCAN AMB NON-IMAGING; Future  2. Overactive bladder -Gemtesa 75mg  daily - BLADDER SCAN  AMB NON-IMAGING; Future  3. Weak urinary stream -continue uroxatral 10mg  qhs   No follow-ups on  file.  Nicolette Bang, MD  [facility] [place]

## 2021-03-01 NOTE — Patient Instructions (Signed)
Overactive Bladder, Adult  Overactive bladder is a condition in which a person has a sudden and frequent need to urinate. A person might also leak urine if he or she cannot get to the bathroom fast enough (urinary incontinence). Sometimes, symptoms can interfere with work or social activities. What are the causes? Overactive bladder is associated with poor nerve signals between your bladder and your brain. Your bladder may get the signal to empty before it is full. You may also have very sensitive muscles that make your bladder squeeze too soon. This condition may also be caused by other factors, such as:  Medical conditions: ? Urinary tract infection. ? Infection of nearby tissues. ? Prostate enlargement. ? Bladder stones, inflammation, or tumors. ? Diabetes. ? Muscle or nerve weakness, especially from these conditions:  A spinal cord injury.  Stroke.  Multiple sclerosis.  Parkinson's disease.  Other causes: ? Surgery on the uterus or urethra. ? Drinking too much caffeine or alcohol. ? Certain medicines, especially those that eliminate extra fluid in the body (diuretics). ? Constipation. What increases the risk? You may be at greater risk for overactive bladder if you:  Are an older adult.  Smoke.  Are going through menopause.  Have prostate problems.  Have a neurological disease, such as stroke, dementia, Parkinson's disease, or multiple sclerosis (MS).  Eat or drink alcohol, spicy food, caffeine, and other things that irritate the bladder.  Are overweight or obese. What are the signs or symptoms? Symptoms of this condition include a sudden, strong urge to urinate. Other symptoms include:  Leaking urine.  Urinating 8 or more times a day.  Waking up to urinate 2 or more times overnight. How is this diagnosed? This condition may be diagnosed based on:  Your symptoms and medical history.  A physical exam.  Blood or urine tests to check for possible causes,  such as infection. You may also need to see a health care provider who specializes in urinary tract problems. This is called a urologist. How is this treated? Treatment for overactive bladder depends on the cause of your condition and whether it is mild or severe. Treatment may include:  Bladder training, such as: ? Learning to control the urge to urinate by following a schedule to urinate at regular intervals. ? Doing Kegel exercises to strengthen the pelvic floor muscles that support your bladder.  Special devices, such as: ? Biofeedback. This uses sensors to help you become aware of your body's signals. ? Electrical stimulation. This uses electrodes placed inside the body (implanted) or outside the body. These electrodes send gentle pulses of electricity to strengthen the nerves or muscles that control the bladder. ? Women may use a plastic device, called a pessary, that fits into the vagina and supports the bladder.  Medicines, such as: ? Antibiotics to treat bladder infection. ? Antispasmodics to stop the bladder from releasing urine at the wrong time. ? Tricyclic antidepressants to relax bladder muscles. ? Injections of botulinum toxin type A directly into the bladder tissue to relax bladder muscles.  Surgery, such as: ? A device may be implanted to help manage the nerve signals that control urination. ? An electrode may be implanted to stimulate electrical signals in the bladder. ? A procedure may be done to change the shape of the bladder. This is done only in very severe cases. Follow these instructions at home: Eating and drinking  Make diet or lifestyle changes recommended by your health care provider. These may include: ? Drinking fluids   throughout the day and not only with meals. ? Cutting down on caffeine or alcohol. ? Eating a healthy and balanced diet to prevent constipation. This may include:  Choosing foods that are high in fiber, such as beans, whole grains, and  fresh fruits and vegetables.  Limiting foods that are high in fat and processed sugars, such as fried and sweet foods.   Lifestyle  Lose weight if needed.  Do not use any products that contain nicotine or tobacco. These include cigarettes, chewing tobacco, and vaping devices, such as e-cigarettes. If you need help quitting, ask your health care provider.   General instructions  Take over-the-counter and prescription medicines only as told by your health care provider.  If you were prescribed an antibiotic medicine, take it as told by your health care provider. Do not stop taking the antibiotic even if you start to feel better.  Use any implants or pessary as told by your health care provider.  If needed, wear pads to absorb urine leakage.  Keep a log to track how much and when you drink, and when you need to urinate. This will help your health care provider monitor your condition.  Keep all follow-up visits. This is important. Contact a health care provider if:  You have a fever or chills.  Your symptoms do not get better with treatment.  Your pain and discomfort get worse.  You have more frequent urges to urinate. Get help right away if:  You are not able to control your bladder. Summary  Overactive bladder refers to a condition in which a person has a sudden and frequent need to urinate.  Several conditions may lead to an overactive bladder.  Treatment for overactive bladder depends on the cause and severity of your condition.  Making lifestyle changes, doing Kegel exercises, keeping a log, and taking medicines can help with this condition. This information is not intended to replace advice given to you by your health care provider. Make sure you discuss any questions you have with your health care provider. Document Revised: 06/20/2020 Document Reviewed: 06/20/2020 Elsevier Patient Education  2021 Elsevier Inc.  

## 2021-03-01 NOTE — Progress Notes (Signed)
post void residual=28 ml     Urological Symptom Review  Patient is experiencing the following symptoms: Frequent urination  Get up at night to urinate Urine stream stops and starts Erection problems  Review of Systems  Gastrointestinal (upper)  : Negative for upper GI symptoms  Gastrointestinal (lower) : Negative for lower GI symptoms  Constitutional : Fatigue  Skin: Negative for skin symptoms  Eyes: Blurred vision  Ear/Nose/Throat : Negative for Ear/Nose/Throat symptoms  Hematologic/Lymphatic: Negative for Hematologic/Lymphatic symptoms  Cardiovascular : Negative for cardiovascular symptoms  Respiratory : Negative for respiratory symptoms  Endocrine: Negative for endocrine symptoms  Musculoskeletal: Negative for musculoskeletal symptoms  Neurological: Negative for neurological symptoms  Psychologic: Negative for psychiatric symptoms

## 2021-03-02 DIAGNOSIS — H11153 Pinguecula, bilateral: Secondary | ICD-10-CM | POA: Diagnosis not present

## 2021-03-02 DIAGNOSIS — H02834 Dermatochalasis of left upper eyelid: Secondary | ICD-10-CM | POA: Diagnosis not present

## 2021-03-02 DIAGNOSIS — H16421 Pannus (corneal), right eye: Secondary | ICD-10-CM | POA: Diagnosis not present

## 2021-03-02 DIAGNOSIS — H11823 Conjunctivochalasis, bilateral: Secondary | ICD-10-CM | POA: Diagnosis not present

## 2021-03-02 DIAGNOSIS — H401231 Low-tension glaucoma, bilateral, mild stage: Secondary | ICD-10-CM | POA: Diagnosis not present

## 2021-03-02 DIAGNOSIS — H02831 Dermatochalasis of right upper eyelid: Secondary | ICD-10-CM | POA: Diagnosis not present

## 2021-03-05 ENCOUNTER — Other Ambulatory Visit: Payer: Self-pay | Admitting: Family Medicine

## 2021-04-10 NOTE — Assessment & Plan Note (Signed)
ILD and plaques c/w asbestos exposure. CT as of 11/21/20 not indicating obvious profession or neoplastic concern Plan- continued surveillance.

## 2021-04-10 NOTE — Assessment & Plan Note (Signed)
Controlled by weight loss. Cautioned against regaining weight, esp w AFib.

## 2021-04-11 ENCOUNTER — Ambulatory Visit (INDEPENDENT_AMBULATORY_CARE_PROVIDER_SITE_OTHER): Payer: Medicare Other | Admitting: Urology

## 2021-04-11 ENCOUNTER — Encounter: Payer: Self-pay | Admitting: Urology

## 2021-04-11 ENCOUNTER — Ambulatory Visit: Payer: Medicare Other | Admitting: Urology

## 2021-04-11 ENCOUNTER — Other Ambulatory Visit: Payer: Self-pay

## 2021-04-11 VITALS — BP 151/74 | HR 79

## 2021-04-11 DIAGNOSIS — I701 Atherosclerosis of renal artery: Secondary | ICD-10-CM

## 2021-04-11 DIAGNOSIS — N401 Enlarged prostate with lower urinary tract symptoms: Secondary | ICD-10-CM

## 2021-04-11 DIAGNOSIS — N3281 Overactive bladder: Secondary | ICD-10-CM

## 2021-04-11 DIAGNOSIS — R3912 Poor urinary stream: Secondary | ICD-10-CM | POA: Diagnosis not present

## 2021-04-11 DIAGNOSIS — N138 Other obstructive and reflux uropathy: Secondary | ICD-10-CM | POA: Diagnosis not present

## 2021-04-11 LAB — URINALYSIS, ROUTINE W REFLEX MICROSCOPIC
Bilirubin, UA: NEGATIVE
Glucose, UA: NEGATIVE
Nitrite, UA: NEGATIVE
RBC, UA: NEGATIVE
Specific Gravity, UA: 1.01 (ref 1.005–1.030)
Urobilinogen, Ur: 0.2 mg/dL (ref 0.2–1.0)
pH, UA: 7 (ref 5.0–7.5)

## 2021-04-11 LAB — MICROSCOPIC EXAMINATION
RBC, Urine: NONE SEEN /hpf (ref 0–2)
Renal Epithel, UA: NONE SEEN /hpf
WBC, UA: >30 /hpf — AB (ref 0–5)

## 2021-04-11 MED ORDER — CIPROFLOXACIN HCL 500 MG PO TABS
500.0000 mg | ORAL_TABLET | Freq: Once | ORAL | Status: DC
Start: 2021-04-11 — End: 2021-04-11

## 2021-04-11 MED ORDER — ALFUZOSIN HCL ER 10 MG PO TB24: 10.0000 mg | ORAL_TABLET | Freq: Every day | ORAL | 11 refills | Status: DC

## 2021-04-11 NOTE — Patient Instructions (Signed)
Benign Prostatic Hyperplasia  Benign prostatic hyperplasia (BPH) is an enlarged prostate gland that is caused by the normal aging process and not by cancer. The prostate is a walnut-sized gland that is involved in the production of semen. It is located in front of the rectum and below the bladder. The bladder stores urine and the urethra is the tube that carries the urine out of the body. The prostate may get bigger asa man gets older. An enlarged prostate can press on the urethra. This can make it harder to pass urine. The build-up of urine in the bladder can cause infection. Back pressure and infection may progress to bladder damage and kidney (renal) failure. What are the causes? This condition is part of a normal aging process. However, not all men develop problems from this condition. If the prostate enlarges away from the urethra, urine flow will not be blocked. If it enlarges toward the urethra andcompresses it, there will be problems passing urine. What increases the risk? This condition is more likely to develop in men over the age of 50 years. What are the signs or symptoms? Symptoms of this condition include: Getting up often during the night to urinate. Needing to urinate frequently during the day. Difficulty starting urine flow. Decrease in size and strength of your urine stream. Leaking (dribbling) after urinating. Inability to pass urine. This needs immediate treatment. Inability to completely empty your bladder. Pain when you pass urine. This is more common if there is also an infection. Urinary tract infection (UTI). How is this diagnosed? This condition is diagnosed based on your medical history, a physical exam, and your symptoms. Tests will also be done, such as: A post-void bladder scan. This measures any amount of urine that may remain in your bladder after you finish urinating. A digital rectal exam. In a rectal exam, your health care provider checks your prostate by  putting a lubricated, gloved finger into your rectum to feel the back of your prostate gland. This exam detects the size of your gland and any abnormal lumps or growths. An exam of your urine (urinalysis). A prostate specific antigen (PSA) screening. This is a blood test used to screen for prostate cancer. An ultrasound. This test uses sound waves to electronically produce a picture of your prostate gland. Your health care provider may refer you to a specialist in kidney and prostate diseases (urologist). How is this treated? Once symptoms begin, your health care provider will monitor your condition (active surveillance or watchful waiting). Treatment for this condition will depend on the severity of your condition. Treatment may include: Observation and yearly exams. This may be the only treatment needed if your condition and symptoms are mild. Medicines to relieve your symptoms, including: Medicines to shrink the prostate. Medicines to relax the muscle of the prostate. Surgery in severe cases. Surgery may include: Prostatectomy. In this procedure, the prostate tissue is removed completely through an open incision or with a laparoscope or robotics. Transurethral resection of the prostate (TURP). In this procedure, a tool is inserted through the opening at the tip of the penis (urethra). It is used to cut away tissue of the inner core of the prostate. The pieces are removed through the same opening of the penis. This removes the blockage. Transurethral incision (TUIP). In this procedure, small cuts are made in the prostate. This lessens the prostate's pressure on the urethra. Transurethral microwave thermotherapy (TUMT). This procedure uses microwaves to create heat. The heat destroys and removes a small   amount of prostate tissue. Transurethral needle ablation (TUNA). This procedure uses radio frequencies to destroy and remove a small amount of prostate tissue. Interstitial laser coagulation (ILC).  This procedure uses a laser to destroy and remove a small amount of prostate tissue. Transurethral electrovaporization (TUVP). This procedure uses electrodes to destroy and remove a small amount of prostate tissue. Prostatic urethral lift. This procedure inserts an implant to push the lobes of the prostate away from the urethra. Follow these instructions at home: Take over-the-counter and prescription medicines only as told by your health care provider. Monitor your symptoms for any changes. Contact your health care provider with any changes. Avoid drinking large amounts of liquid before going to bed or out in public. Avoid or reduce how much caffeine or alcohol you drink. Give yourself time when you urinate. Keep all follow-up visits as told by your health care provider. This is important. Contact a health care provider if: You have unexplained back pain. Your symptoms do not get better with treatment. You develop side effects from the medicine you are taking. Your urine becomes very dark or has a bad smell. Your lower abdomen becomes distended and you have trouble passing your urine. Get help right away if: You have a fever or chills. You suddenly cannot urinate. You feel lightheaded, or very dizzy, or you faint. There are large amounts of blood or clots in the urine. Your urinary problems become hard to manage. You develop moderate to severe low back or flank pain. The flank is the side of your body between the ribs and the hip. These symptoms may represent a serious problem that is an emergency. Do not wait to see if the symptoms will go away. Get medical help right away. Call your local emergency services (911 in the [place] Do not drive yourself to the hospital. Summary Benign prostatic hyperplasia (BPH) is an enlarged prostate that is caused by the normal aging process and not by cancer. An enlarged prostate can press on the urethra. This can make it hard to pass urine. This  condition is part of a normal aging process and is more likely to develop in men over the age of 50 years. Get help right away if you suddenly cannot urinate. This information is not intended to replace advice given to you by your health care provider. Make sure you discuss any questions you have with your healthcare provider. Document Revised: 06/09/2020 Document Reviewed: 06/09/2020 Elsevier Patient Education  2022 Elsevier Inc.  

## 2021-04-11 NOTE — Progress Notes (Signed)
Urological Symptom Review- patient takes fluid pill. Patient feels his frequent urination is from his fluid pill. Patient reports he had to go to ER due to fluid pill and his electrolyte levels.   Patient is experiencing the following symptoms: Frequent urination Hard to postpone urination Get up at night to urinate Erection problems (male only)   Review of Systems  Gastrointestinal (upper)  : Negative for upper GI symptoms  Gastrointestinal (lower) : Negative for lower GI symptoms  Constitutional : Fatigue  Skin: Negative for skin symptoms  Eyes: Negative for eye symptoms  Ear/Nose/Throat : Sinus problems  Hematologic/Lymphatic: Negative for Hematologic/Lymphatic symptoms  Cardiovascular : Leg swelling  Respiratory : Negative for respiratory symptoms  Endocrine: Negative for endocrine symptoms  Musculoskeletal: Negative for musculoskeletal symptoms  Neurological: Negative for neurological symptoms  Psychologic: Negative for psychiatric symptoms

## 2021-04-11 NOTE — Addendum Note (Signed)
Addended byIris Pert on: 04/11/2021 03:12 PM   Modules accepted: Orders

## 2021-04-11 NOTE — Progress Notes (Signed)
04/11/2021 1:49 PM   Julian Fowler [birth year] [id number]  Referring provider: [name], MD [place],  [place] [address]  Followup BPH   HPI: Julian Fowler is a 86yo here for for followup for BPH with nocturia and OAB. He has glaucoma. He has failed gemtesa and mirabegron. IPSS 28 QOL 3. He is on uroxatrla 10mg  qhs. Stream is good. He has a feeling of incomplete emptying. NO other complaints.    PMH: Past Medical History:  Diagnosis Date   Allergic rhinitis    Aortic stenosis    Arthritis    Atrial fibrillation (HCC)    BPH (benign prostatic hyperplasia)    Coronary artery calcification seen on CT scan    Easy bruisability    ED (erectile dysfunction)    Essential hypertension    GERD (gastroesophageal reflux disease)    H/O hiatal hernia    Hearing loss in left ear    History of asbestosis    History of shingles    Hyperlipidemia    Overactive bladder    PSVT (paroxysmal supraventricular tachycardia) (HCC)    Sleep apnea    No longer on CPAP following weight loss   Tinnitus     Surgical History: Past Surgical History:  Procedure Laterality Date   BACK SURGERY  2014   herniated L1, L2   Dr Carloyn Manner   BRAIN SURGERY     CEREBRAL EMBOLIZATION  12/2011   "radiation therapy-did not work"   CHOLECYSTECTOMY  6/98   COLONOSCOPY  09/2009   Dr. Lindalou Hose: normal, internal hemorrhoids    COLONOSCOPY N/A 02/28/2017   Dr. Gala Romney: Hemorrhoids, grade 3, mild diverticulosis.   CRANIECTOMY FOR EXCISION OF ACOUSTIC NEUROMA  3/95   MINOR AMPUTATION OF DIGIT Left 12/31/2018   Procedure: REVISION AMPUTATION OF LEFT INDEX FINGER, IRRIGATION AND DEBRIDEMENT LEFT INDEX FINGER;  Surgeon: Verner Mould, MD;  Location: [facility];  Service: Orthopedics;  Laterality: Left;   RADIOLOGY WITH ANESTHESIA N/A 07/07/2014   Procedure: EMBOLIZATION;  Surgeon: Rob Hickman, MD;  Location: [facility];  Service: Radiology;  Laterality: N/A;   TOTAL KNEE ARTHROPLASTY Left 07/06/2013    Procedure: LEFT TOTAL KNEE ARTHROPLASTY;  Surgeon: Gearlean Alf, MD;  Location: [facility];  Service: Orthopedics;  Laterality: Left;   TOTAL KNEE ARTHROPLASTY Right 01/18/2014   Procedure: RIGHT TOTAL KNEE ARTHROPLASTY;  Surgeon: Gearlean Alf, MD;  Location: [facility];  Service: Orthopedics;  Laterality: Right;   TRIGGER FINGER RELEASE  2003   (thumb) middle finger (2006)    Home Medications:  Allergies as of 04/11/2021       Reactions   Bactrim [sulfamethoxazole-trimethoprim]    Prednisone    Pt is high functioning and out of sorts.   Codeine Other (See Comments)   REACTION: groggy   Tape Other (See Comments)   Skin tears, use paper tape        Medication List        Accurate as of April 11, 2021  1:49 PM. If you have any questions, ask your nurse or doctor.          acetaminophen 325 MG tablet Commonly known as: TYLENOL Take 325 mg by mouth as needed.   alfuzosin 10 MG 24 hr tablet Commonly known as: UROXATRAL Take 1 tablet (10 mg total) by mouth at bedtime.   azelastine 0.1 % nasal spray Commonly known as: ASTELIN 1-2 puffs each nostril every 8 hours if needed for drainage  What changed:  when to take this reasons to take this   Benefiber Powd Take 1 packet by mouth 2 (two) times daily.   CENTRUM SILVER PO Take 1 tablet by mouth daily.   diclofenac Sodium 1 % Gel Commonly known as: VOLTAREN Apply 2 g topically daily as needed (For pain).   esomeprazole 40 MG capsule Commonly known as: NexIUM Take 1 capsule (40 mg total) by mouth 2 (two) times daily before a meal.   fexofenadine 180 MG tablet Commonly known as: ALLEGRA Take 180 mg by mouth daily as needed for allergies.   fluticasone 50 MCG/ACT nasal spray Commonly known as: FLONASE Place 2 sprays into both nostrils daily. What changed:  when to take this reasons to take this   furosemide 20 MG tablet Commonly known as: LASIX Take 1 tablet (20 mg total) by mouth daily.   Gemtesa 75 MG  Tabs Generic drug: Vibegron Take 1 capsule by mouth daily.   hydrALAZINE 50 MG tablet Commonly known as: APRESOLINE Take 1.5 tablets (75 mg total) by mouth 3 (three) times daily.   latanoprost 0.005 % ophthalmic solution Commonly known as: XALATAN Place 1 drop into both eyes at bedtime.   LORazepam 1 MG tablet Commonly known as: ATIVAN Take 1 tablet (1 mg total) by mouth 3 (three) times daily as needed for anxiety.   losartan 100 MG tablet Commonly known as: COZAAR Take 100 mg by mouth every morning.   metoprolol succinate 25 MG 24 hr tablet Commonly known as: TOPROL-XL Take 25 mg by mouth daily. 1/2 tablet daily   ondansetron 4 MG tablet Commonly known as: ZOFRAN Take 4 mg by mouth every 8 (eight) hours as needed.   Os-Cal Ultra 600 MG Tabs Take 1 tablet by mouth daily.   polyethylene glycol 17 g packet Commonly known as: MIRALAX / GLYCOLAX Take 17 g by mouth at bedtime.   potassium chloride SA 20 MEQ tablet Commonly known as: KLOR-CON Take 1 tablet by mouth twice daily   pravastatin 40 MG tablet Commonly known as: PRAVACHOL Take 1 tablet by mouth at bedtime.   ProAir HFA 108 (90 Base) MCG/ACT inhaler Generic drug: albuterol INHALE 2 PUFFS BY MOUTH EVERY 6 HOURS AS NEEDED FOR WHEEZING FOR SHORTNESS OF BREATH What changed: See the new instructions.   sodium chloride 1 g tablet Take 1 g by mouth 3 (three) times daily.   Symbicort 160-4.5 MCG/ACT inhaler Generic drug: budesonide-formoterol INHALE 2 PUFFS BY MOUTH  TWICE DAILY THEN  RINSE  MOUTH   SYSTANE OP Place 1 drop into both eyes daily.   Zinc 25 MG Tabs Take 1 tablet by mouth daily.   zolpidem 10 MG tablet Commonly known as: AMBIEN Take 1 tablet by mouth at bedtime.        Allergies:  Allergies  Allergen Reactions   Bactrim [Sulfamethoxazole-Trimethoprim]    Prednisone     Pt is high functioning and out of sorts.   Codeine Other (See Comments)    REACTION: groggy   Tape Other (See  Comments)    Skin tears, use paper tape    Family History: Family History  Problem Relation Age of Onset   Cancer Father        oral cancer   Breast cancer Mother    Heart attack Mother    Hypertension Sister        Bypass x4   Alcohol abuse Sister    Alzheimer's disease Sister    Kidney disease Sister  Colon cancer Neg Hx     Social History:  reports that he quit smoking about 34 years ago. His smoking use included cigarettes. He started smoking about 64 years ago. He has a 45.00 pack-year smoking history. He has never used smokeless tobacco. He reports that he does not drink alcohol and does not use drugs.  ROS: All other review of systems were reviewed and are negative except what is noted above in HPI  Physical Exam: BP (!) 151/74   Pulse 79   Constitutional:  Alert and oriented, No acute distress. HEENT: [place] AT, moist mucus membranes.  Trachea midline, no masses. Cardiovascular: No clubbing, cyanosis, or edema. Respiratory: Normal respiratory effort, no increased work of breathing. GI: Abdomen is soft, nontender, nondistended, no abdominal masses GU: No CVA tenderness.  Lymph: No cervical or inguinal lymphadenopathy. Skin: No rashes, bruises or suspicious lesions. Neurologic: Grossly intact, no focal deficits, moving all 4 extremities. Psychiatric: Normal mood and affect.  Laboratory Data: Lab Results  Component Value Date   WBC 7.1 11/28/2020   HGB 11.3 (L) 11/28/2020   HCT 32.9 (L) 11/28/2020   MCV 90.6 11/28/2020   PLT 450 (H) 11/28/2020    Lab Results  Component Value Date   CREATININE 0.68 11/28/2020    Lab Results  Component Value Date   PSA 3.60 01/06/2009   PSA 3.05 03/25/2007    No results found for: TESTOSTERONE  No results found for: HGBA1C  Urinalysis    Component Value Date/Time   COLORURINE STRAW (A) 11/21/2020 1230   APPEARANCEUR Clear 12/29/2020 1634   LABSPEC 1.005 11/21/2020 1230   PHURINE 7.0 11/21/2020 1230   GLUCOSEU  Negative 12/29/2020 1634   HGBUR NEGATIVE 11/21/2020 1230   HGBUR negative 10/27/2008 0838   BILIRUBINUR Negative 12/29/2020 [place] 11/21/2020 1230   PROTEINUR Negative 12/29/2020 1634   PROTEINUR NEGATIVE 11/21/2020 1230   UROBILINOGEN 0.2 01/11/2014 1405   NITRITE Negative 12/29/2020 1634   NITRITE NEGATIVE 11/21/2020 1230   LEUKOCYTESUR Trace (A) 12/29/2020 1634   LEUKOCYTESUR NEGATIVE 11/21/2020 1230    Lab Results  Component Value Date   LABMICR See below: 12/29/2020   WBCUA 0-5 12/29/2020   LABEPIT None seen 12/29/2020   BACTERIA Few 12/29/2020    Pertinent Imaging:  No results found for this or any previous visit.  No results found for this or any previous visit.  No results found for this or any previous visit.  No results found for this or any previous visit.  No results found for this or any previous visit.  No results found for this or any previous visit.  No results found for this or any previous visit.  No results found for this or any previous visit.   Assessment & Plan:    1. Benign prostatic hyperplasia with urinary obstruction -COntinue uroxatral  - ciprofloxacin (CIPRO) tablet 500 mg - Urinalysis, Routine w reflex microscopic  2. Overactive bladder We discussed PTNS, intravesical botox and interstim  3. Weak urinary stream -Uroxatral 10mg  qhs   No follow-ups on file.  Nicolette Bang, MD  [facility] [place]

## 2021-04-14 ENCOUNTER — Other Ambulatory Visit: Payer: Self-pay

## 2021-04-14 ENCOUNTER — Telehealth: Payer: Self-pay

## 2021-04-14 DIAGNOSIS — R3912 Poor urinary stream: Secondary | ICD-10-CM

## 2021-04-14 LAB — URINE CULTURE

## 2021-04-14 MED ORDER — NITROFURANTOIN MONOHYD MACRO 100 MG PO CAPS: 100.0000 mg | ORAL_CAPSULE | Freq: Two times a day (BID) | ORAL | 0 refills | Status: DC

## 2021-04-14 NOTE — Telephone Encounter (Signed)
Patient called and notified of results and prescription sent to pharmacy. Pt voiced understanding.

## 2021-04-14 NOTE — Telephone Encounter (Signed)
-----   Message from Cleon Gustin, MD sent at 04/14/2021  3:55 PM EDT ----- Macrobid 100mg  BID for 7 days ----- Message ----- From: Dorisann Frames, RN Sent: 04/14/2021   9:21 AM EDT To: Cleon Gustin, MD  Please review- patient not on any medication for UTI

## 2021-04-14 NOTE — Progress Notes (Signed)
Opened in error

## 2021-04-25 DIAGNOSIS — M79675 Pain in left toe(s): Secondary | ICD-10-CM | POA: Diagnosis not present

## 2021-04-25 DIAGNOSIS — M79671 Pain in right foot: Secondary | ICD-10-CM | POA: Diagnosis not present

## 2021-04-25 DIAGNOSIS — L110 Acquired keratosis follicularis: Secondary | ICD-10-CM | POA: Diagnosis not present

## 2021-04-25 DIAGNOSIS — M79672 Pain in left foot: Secondary | ICD-10-CM | POA: Diagnosis not present

## 2021-04-25 DIAGNOSIS — I739 Peripheral vascular disease, unspecified: Secondary | ICD-10-CM | POA: Diagnosis not present

## 2021-04-25 DIAGNOSIS — M79674 Pain in right toe(s): Secondary | ICD-10-CM | POA: Diagnosis not present

## 2021-05-01 DIAGNOSIS — I10 Essential (primary) hypertension: Secondary | ICD-10-CM | POA: Diagnosis not present

## 2021-05-01 DIAGNOSIS — E871 Hypo-osmolality and hyponatremia: Secondary | ICD-10-CM | POA: Diagnosis not present

## 2021-05-01 DIAGNOSIS — R7301 Impaired fasting glucose: Secondary | ICD-10-CM | POA: Diagnosis not present

## 2021-05-01 DIAGNOSIS — E782 Mixed hyperlipidemia: Secondary | ICD-10-CM | POA: Diagnosis not present

## 2021-05-01 DIAGNOSIS — E7849 Other hyperlipidemia: Secondary | ICD-10-CM | POA: Diagnosis not present

## 2021-05-04 DIAGNOSIS — I350 Nonrheumatic aortic (valve) stenosis: Secondary | ICD-10-CM | POA: Diagnosis not present

## 2021-05-04 DIAGNOSIS — E782 Mixed hyperlipidemia: Secondary | ICD-10-CM | POA: Diagnosis not present

## 2021-05-04 DIAGNOSIS — I4819 Other persistent atrial fibrillation: Secondary | ICD-10-CM | POA: Diagnosis not present

## 2021-05-04 DIAGNOSIS — J4520 Mild intermittent asthma, uncomplicated: Secondary | ICD-10-CM | POA: Diagnosis not present

## 2021-05-04 DIAGNOSIS — Z6837 Body mass index (BMI) 37.0-37.9, adult: Secondary | ICD-10-CM | POA: Diagnosis not present

## 2021-05-04 DIAGNOSIS — R300 Dysuria: Secondary | ICD-10-CM | POA: Diagnosis not present

## 2021-05-04 DIAGNOSIS — I10 Essential (primary) hypertension: Secondary | ICD-10-CM | POA: Diagnosis not present

## 2021-05-04 DIAGNOSIS — E871 Hypo-osmolality and hyponatremia: Secondary | ICD-10-CM | POA: Diagnosis not present

## 2021-05-04 DIAGNOSIS — I701 Atherosclerosis of renal artery: Secondary | ICD-10-CM | POA: Diagnosis not present

## 2021-05-04 DIAGNOSIS — E7849 Other hyperlipidemia: Secondary | ICD-10-CM | POA: Diagnosis not present

## 2021-05-09 ENCOUNTER — Other Ambulatory Visit: Payer: Self-pay

## 2021-05-10 ENCOUNTER — Ambulatory Visit: Payer: Medicare Other | Admitting: Cardiology

## 2021-05-16 ENCOUNTER — Telehealth: Payer: Self-pay

## 2021-05-16 DIAGNOSIS — N390 Urinary tract infection, site not specified: Secondary | ICD-10-CM

## 2021-05-16 MED ORDER — NITROFURANTOIN MONOHYD MACRO 100 MG PO CAPS: 100.0000 mg | ORAL_CAPSULE | Freq: Two times a day (BID) | ORAL | 0 refills | Status: DC

## 2021-05-16 NOTE — Telephone Encounter (Signed)
Culture sent from PCP office and rx sent in per Dr. Alyson Ingles to treat infection. Wife called and made aware.

## 2021-05-18 ENCOUNTER — Ambulatory Visit: Payer: Medicare Other | Admitting: Family Medicine

## 2021-06-02 ENCOUNTER — Ambulatory Visit (INDEPENDENT_AMBULATORY_CARE_PROVIDER_SITE_OTHER): Payer: Medicare Other | Admitting: Family Medicine

## 2021-06-02 ENCOUNTER — Other Ambulatory Visit: Payer: Self-pay

## 2021-06-02 ENCOUNTER — Encounter: Payer: Self-pay | Admitting: Family Medicine

## 2021-06-02 VITALS — BP 154/74 | HR 64 | Ht 69.0 in | Wt 247.4 lb

## 2021-06-02 DIAGNOSIS — I350 Nonrheumatic aortic (valve) stenosis: Secondary | ICD-10-CM

## 2021-06-02 DIAGNOSIS — I4819 Other persistent atrial fibrillation: Secondary | ICD-10-CM | POA: Diagnosis not present

## 2021-06-02 DIAGNOSIS — I10 Essential (primary) hypertension: Secondary | ICD-10-CM

## 2021-06-02 DIAGNOSIS — I2510 Atherosclerotic heart disease of native coronary artery without angina pectoris: Secondary | ICD-10-CM

## 2021-06-02 NOTE — Patient Instructions (Addendum)

## 2021-06-02 NOTE — Progress Notes (Signed)
Cardiology Office Note  Date: 06/02/2021   ID: Julian Fowler, DOB 02-Nov-1934, MRN [id number]  PCP:  [name], MD  Cardiologist:  Rozann Lesches, MD Electrophysiologist:  None   Chief Complaint: 36-monthfollow-up  History of Present Illness: Julian JANEYis a 86y.o. male with a history of persistent atrial fibrillation, coronary artery calcification, nonrheumatic aortic valve stenosis, hypertension, hyperlipidemia PSVT . Was last seen by Dr. MDomenic Politeon 02/01/2021.  He did not have any sense of palpitations.  His lower extremity edema has been well controlled on low-dose Lasix.  He was continuing potassium supplements and sodium tablets with history of hyponatremia.  Recent sodium of 135 per PCP.  He had been taken off of Cardizem CD due to bradycardia.  Also had not been anticoagulated due to fall history.  His CHA2DS2-VASc score was 4.  He remained on Toprol-XL.  Dr. MDomenic Politereviewed recent limited cardiac monitor.  His EKG at visit showed atrial fibrillation at 59 bpm.  Stroke prophylaxis was discussed.  Patient's wife stated he was very prone to falls.  He was concerned about bleeding risk and hesitant to pursue DOAC at that time.  Plan was to hold off on systemic anticoagulation at that point due to frequent falls and risk for bleeding.  He had mild aortic stenosis with a mean gradient of 10.  He was asymptomatic.  History of multivessel's coronary artery calcification by CT.  No active symptoms.  He was continuing Pravachol.  He is here today for 639-monthollow-up.  He denies any anginal or exertional symptoms.  Denies any orthostatic symptoms but does have some balance issues due to previous history of acoustic neuroma with surgery.  States since that time he has had balance issues with falls.  He denies any significant palpitations or arrhythmias.  Blood pressure is 154/74.  Heart rate is 64.  His current cardiac regimen includes hydralazine 75 mg p.o. 3 times daily,  losartan 100 mg daily.  Toprol-XL 12.5 mg daily, pravastatin 40 mg p.o. daily, potassium 40 mg p.o. daily,  Lasix 20 mg p.o. daily.  He complains of chronic swelling in his right foot.  He states he has been to podiatrist, vascular specialist and orthopedist to determine what the problem is with his right foot.  He states he has had some bad cellulitis in that foot in the past and is concerned he may have had some permanent damage.  He states he has numbness and loss of feeling in that foot.  He continues taking sodium tablets for chronic hyponatremia.  We discussed possibly seeing a neurologist for right foot numbness.   Past Medical History:  Diagnosis Date   Allergic rhinitis    Aortic stenosis    Arthritis    Atrial fibrillation (HCC)    BPH (benign prostatic hyperplasia)    Coronary artery calcification seen on CT scan    Easy bruisability    ED (erectile dysfunction)    Essential hypertension    GERD (gastroesophageal reflux disease)    H/O hiatal hernia    Hearing loss in left ear    History of asbestosis    History of shingles    Hyperlipidemia    Overactive bladder    PSVT (paroxysmal supraventricular tachycardia) (HCC)    Sleep apnea    No longer on CPAP following weight loss   Tinnitus     Past Surgical History:  Procedure Laterality Date   BACK SURGERY  2014   herniated L1,  L2   Dr Carloyn Manner   BRAIN SURGERY     CEREBRAL EMBOLIZATION  12/2011   "radiation therapy-did not work"   CHOLECYSTECTOMY  6/98   COLONOSCOPY  09/2009   Dr. Lindalou Hose: normal, internal hemorrhoids    COLONOSCOPY N/A 02/28/2017   Dr. Gala Romney: Hemorrhoids, grade 3, mild diverticulosis.   CRANIECTOMY FOR EXCISION OF ACOUSTIC NEUROMA  3/95   MINOR AMPUTATION OF DIGIT Left 12/31/2018   Procedure: REVISION AMPUTATION OF LEFT INDEX FINGER, IRRIGATION AND DEBRIDEMENT LEFT INDEX FINGER;  Surgeon: Verner Mould, MD;  Location: [facility];  Service: Orthopedics;  Laterality: Left;   RADIOLOGY WITH ANESTHESIA  N/A 07/07/2014   Procedure: EMBOLIZATION;  Surgeon: Rob Hickman, MD;  Location: [facility];  Service: Radiology;  Laterality: N/A;   TOTAL KNEE ARTHROPLASTY Left 07/06/2013   Procedure: LEFT TOTAL KNEE ARTHROPLASTY;  Surgeon: Gearlean Alf, MD;  Location: [facility];  Service: Orthopedics;  Laterality: Left;   TOTAL KNEE ARTHROPLASTY Right 01/18/2014   Procedure: RIGHT TOTAL KNEE ARTHROPLASTY;  Surgeon: Gearlean Alf, MD;  Location: [facility];  Service: Orthopedics;  Laterality: Right;   TRIGGER FINGER RELEASE  2003   (thumb) middle finger (2006)    Current Outpatient Medications  Medication Sig Dispense Refill   acetaminophen (TYLENOL) 325 MG tablet Take 325 mg by mouth as needed.     alfuzosin (UROXATRAL) 10 MG 24 hr tablet Take 1 tablet (10 mg total) by mouth at bedtime. 30 tablet 11   azelastine (ASTELIN) 0.1 % nasal spray 1-2 puffs each nostril every 8 hours if needed for drainage (Patient taking differently: as needed. 1-2 puffs each nostril every 8 hours if needed for drainage) 30 mL 12   Calcium Carb-Vit D-C-E-Mineral (OS-CAL ULTRA) 600 MG TABS Take 1 tablet by mouth daily.      diclofenac Sodium (VOLTAREN) 1 % GEL Apply 2 g topically daily as needed (For pain).     esomeprazole (NEXIUM) 40 MG capsule Take 1 capsule (40 mg total) by mouth 2 (two) times daily before a meal. 60 capsule 5   fexofenadine (ALLEGRA) 180 MG tablet Take 180 mg by mouth daily as needed for allergies.     fluticasone (FLONASE) 50 MCG/ACT nasal spray Place 2 sprays into both nostrils daily. (Patient taking differently: Place 2 sprays into both nostrils daily as needed for allergies.) 18.2 g 12   furosemide (LASIX) 20 MG tablet Take 1 tablet (20 mg total) by mouth daily. 30 tablet 3   hydrALAZINE (APRESOLINE) 50 MG tablet Take 1.5 tablets (75 mg total) by mouth 3 (three) times daily. 410 tablet 3   latanoprost (XALATAN) 0.005 % ophthalmic solution Place 1 drop into both eyes at bedtime.      LORazepam (ATIVAN) 1 MG  tablet Take 1 tablet (1 mg total) by mouth 3 (three) times daily as needed for anxiety. 90 tablet 5   losartan (COZAAR) 100 MG tablet Take 100 mg by mouth every morning.      metoprolol succinate (TOPROL-XL) 25 MG 24 hr tablet Take 25 mg by mouth daily. 1/2 tablet daily     Multiple Vitamins-Minerals (CENTRUM SILVER PO) Take 1 tablet by mouth daily.      ondansetron (ZOFRAN) 4 MG tablet Take 4 mg by mouth every 8 (eight) hours as needed.     Polyethyl Glycol-Propyl Glycol (SYSTANE OP) Place 1 drop into both eyes daily.     polyethylene glycol (MIRALAX / GLYCOLAX) packet Take 17 g by mouth at bedtime.  potassium chloride SA (KLOR-CON) 20 MEQ tablet Take 1 tablet by mouth twice daily 180 tablet 0   pravastatin (PRAVACHOL) 40 MG tablet Take 1 tablet by mouth at bedtime.      PROAIR HFA 108 (90 Base) MCG/ACT inhaler INHALE 2 PUFFS BY MOUTH EVERY 6 HOURS AS NEEDED FOR WHEEZING FOR SHORTNESS OF BREATH (Patient taking differently: Inhale 1 puff into the lungs every 6 (six) hours as needed.) 18 g 12   sodium chloride 1 g tablet Take 1 g by mouth 3 (three) times daily.     SYMBICORT 160-4.5 MCG/ACT inhaler INHALE 2 PUFFS BY MOUTH  TWICE DAILY THEN  RINSE  MOUTH 11 g 6   Wheat Dextrin (BENEFIBER) POWD Take 1 packet by mouth 2 (two) times daily.     Zinc 25 MG TABS Take 1 tablet by mouth daily.     zolpidem (AMBIEN) 10 MG tablet Take 1 tablet by mouth at bedtime.      nitrofurantoin, macrocrystal-monohydrate, (MACROBID) 100 MG capsule Take 1 capsule (100 mg total) by mouth every 12 (twelve) hours. (Patient not taking: Reported on 06/02/2021) 14 capsule 0   nitrofurantoin, macrocrystal-monohydrate, (MACROBID) 100 MG capsule Take 1 capsule (100 mg total) by mouth 2 (two) times daily. (Patient not taking: Reported on 06/02/2021) 14 capsule 0   Vibegron (GEMTESA) 75 MG TABS Take 1 capsule by mouth daily. (Patient not taking: Reported on 06/02/2021) 30 tablet 0   No current facility-administered medications for  this visit.   Allergies:  Bactrim [sulfamethoxazole-trimethoprim], Prednisone, Codeine, and Tape   Social History: The patient  reports that he quit smoking about 34 years ago. His smoking use included cigarettes. He started smoking about 64 years ago. He has a 45.00 pack-year smoking history. He has never used smokeless tobacco. He reports that he does not drink alcohol and does not use drugs.   Family History: The patient's family history includes Alcohol abuse in his sister; Alzheimer's disease in his sister; Breast cancer in his mother; Cancer in his father; Heart attack in his mother; Hypertension in his sister; Kidney disease in his sister.   ROS:  Please see the history of present illness. Otherwise, complete review of systems is positive for none.  All other systems are reviewed and negative.   Physical Exam: VS:  BP (!) 154/74   Pulse 64   Ht '5\' 9"'$  (1.753 m)   Wt 247 lb 6.4 oz (112.2 kg)   SpO2 97%   BMI 36.53 kg/m , BMI Body mass index is 36.53 kg/m.  Wt Readings from Last 3 Encounters:  06/02/21 247 lb 6.4 oz (112.2 kg)  03/01/21 252 lb (114.3 kg)  02/01/21 250 lb 12.8 oz (113.8 kg)    General: Patient appears comfortable at rest. Neck: Supple, no elevated JVP or carotid bruits, no thyromegaly. Lungs: Clear to auscultation, nonlabored breathing at rest. Cardiac: Irregularly irregular rate and rhythm, no S3 or significant systolic murmur, no pericardial rub. Extremities: No pitting edema, distal pulses 2+. Skin: Warm and dry. Musculoskeletal: No kyphosis. Neuropsychiatric: Alert and oriented x3, affect grossly appropriate.  ECG:    Recent Labwork: 11/22/2020: ALT 76; AST 37; TSH 0.238 11/28/2020: BUN 10; Creatinine, Ser 0.68; Hemoglobin 11.3; Magnesium 1.8; Platelets 450; Potassium 3.9; Sodium 126     Component Value Date/Time   CHOL 182 01/06/2009 2252   TRIG 128 01/06/2009 2252   HDL 44 01/06/2009 2252   CHOLHDL 4.1 Ratio 01/06/2009 2252   VLDL 26 01/06/2009 2252    LDLCALC  112 (H) 01/06/2009 2252    Other Studies Reviewed Today:   Echocardiogram 11/22/2020:  1. Left ventricular ejection fraction, by estimation, is 60 to 65%. The  left ventricle has normal function. The left ventricle has no regional  wall motion abnormalities. There is moderate left ventricular hypertrophy.  Left ventricular diastolic  parameters are indeterminate.   2. Right ventricular systolic function is normal. The right ventricular  size is normal.   3. The mitral valve is normal in structure. No evidence of mitral valve  regurgitation. No evidence of mitral stenosis.   4. The aortic valve is tricuspid. There is moderate calcification of the  aortic valve. There is moderate thickening of the aortic valve. Aortic  valve regurgitation is not visualized. Mild aortic valve stenosis. Aortic  valve mean gradient measures 10.3  mmHg. Aortic valve peak gradient measures 18.8 mmHg. Aortic valve area, by  VTI measures 1.78 cm.   5. The inferior vena cava is normal in size with greater than 50%  respiratory variability, suggesting right atrial pressure of 3 mmHg.    Cardiac monitor 12/23/2020: ZIO XT reviewed.  2 days 18 hours analyzed.  Predominant rhythm is atrial fibrillation with heart rate ranging from 47 bpm up to 184 bpm and average heart rate 80 bpm.  Rare PVCs were noted representing less than 1% total beats.  Assessment and Plan:  1. Persistent atrial fibrillation ([place])   2. CAD in native artery   3. Nonrheumatic aortic valve stenosis   4. Essential hypertension    1. Persistent atrial fibrillation (HCC) Heart rate controlled today with a rate of 64 and irregularly irregular.  Continue Toprol XL 12.5 mg daily.  Not on anticoagulation due to balance issues and falls.  History of acoustic neuroma with surgery to left ear.  Since that time he has had balance issues.  2. CAD in native artery He denies any anginal or exertional symptoms.  He is not very active on a daily  basis.  History of coronary artery calcification on CT in the past.  Continue pravastatin 40 mg p.o. daily.  3. Nonrheumatic aortic valve stenosis Most recent echocardiogram on 11/22/2020 demonstrated EF of 60 to 65%.  No WMA's, moderate LVH, indeterminate diastolic parameters, mild aortic valve stenosis.  Aortic valve mean gradient 10.3 mm.   5.  Essential hypertension BP today 154/74.  Continue hydralazine 75 mg p.o. 3 times daily.  Continue losartan 100 mg p.o. daily.  Continue Lasix 20 mg p.o. daily.  Continue Toprol XL 12.5 mg p.o. daily.  Continue potassium supplementation 20 mEq p.o. twice daily.  Medication Adjustments/Labs and Tests Ordered: Current medicines are reviewed at length with the patient today.  Concerns regarding medicines are outlined above.   Disposition: Follow-up with Dr. Domenic Polite or APP 6 months  Signed, Levell July, NP 06/02/2021 4:09 PM    [facility] at [place], [place], [place] [address] Phone: [phone]; Fax: [phone]

## 2021-06-06 ENCOUNTER — Telehealth: Payer: Self-pay | Admitting: Urology

## 2021-06-06 NOTE — Telephone Encounter (Signed)
Pt called wanting to speak with a nurse about a prescription. Please call patient when you can.

## 2021-06-06 NOTE — Telephone Encounter (Signed)
Patient c/o of dark colored, foul smelling urine. Patient placed on lab schedule for urine drop off.

## 2021-06-07 ENCOUNTER — Other Ambulatory Visit: Payer: Medicare Other

## 2021-06-07 ENCOUNTER — Other Ambulatory Visit: Payer: Self-pay

## 2021-06-07 DIAGNOSIS — N390 Urinary tract infection, site not specified: Secondary | ICD-10-CM | POA: Diagnosis not present

## 2021-06-07 LAB — URINALYSIS, ROUTINE W REFLEX MICROSCOPIC
Bilirubin, UA: NEGATIVE
Glucose, UA: NEGATIVE
Ketones, UA: NEGATIVE
Nitrite, UA: NEGATIVE
Protein,UA: NEGATIVE
RBC, UA: NEGATIVE
Specific Gravity, UA: 1.015 (ref 1.005–1.030)
Urobilinogen, Ur: 0.2 mg/dL (ref 0.2–1.0)
pH, UA: 7.5 (ref 5.0–7.5)

## 2021-06-07 LAB — MICROSCOPIC EXAMINATION
RBC, Urine: NONE SEEN /hpf (ref 0–2)
Renal Epithel, UA: NONE SEEN /hpf

## 2021-06-08 ENCOUNTER — Encounter: Payer: Self-pay | Admitting: Internal Medicine

## 2021-06-09 LAB — URINE CULTURE

## 2021-06-09 MED ORDER — NITROFURANTOIN MONOHYD MACRO 100 MG PO CAPS: 100.0000 mg | ORAL_CAPSULE | Freq: Two times a day (BID) | ORAL | 0 refills | Status: DC

## 2021-06-09 NOTE — Telephone Encounter (Signed)
Patient made aware of positive urine culture and antibiotic sent to pharmacy.

## 2021-06-10 ENCOUNTER — Other Ambulatory Visit: Payer: Self-pay | Admitting: Cardiology

## 2021-06-12 DIAGNOSIS — I10 Essential (primary) hypertension: Secondary | ICD-10-CM | POA: Diagnosis not present

## 2021-06-12 DIAGNOSIS — E871 Hypo-osmolality and hyponatremia: Secondary | ICD-10-CM | POA: Diagnosis not present

## 2021-06-12 DIAGNOSIS — I4819 Other persistent atrial fibrillation: Secondary | ICD-10-CM | POA: Diagnosis not present

## 2021-06-12 DIAGNOSIS — J920 Pleural plaque with presence of asbestos: Secondary | ICD-10-CM | POA: Diagnosis not present

## 2021-06-12 DIAGNOSIS — E785 Hyperlipidemia, unspecified: Secondary | ICD-10-CM | POA: Diagnosis not present

## 2021-06-27 DIAGNOSIS — L821 Other seborrheic keratosis: Secondary | ICD-10-CM | POA: Diagnosis not present

## 2021-06-27 DIAGNOSIS — D485 Neoplasm of uncertain behavior of skin: Secondary | ICD-10-CM | POA: Diagnosis not present

## 2021-06-27 DIAGNOSIS — L304 Erythema intertrigo: Secondary | ICD-10-CM | POA: Diagnosis not present

## 2021-06-27 DIAGNOSIS — L570 Actinic keratosis: Secondary | ICD-10-CM | POA: Diagnosis not present

## 2021-06-28 DIAGNOSIS — M79674 Pain in right toe(s): Secondary | ICD-10-CM | POA: Diagnosis not present

## 2021-06-28 DIAGNOSIS — M79675 Pain in left toe(s): Secondary | ICD-10-CM | POA: Diagnosis not present

## 2021-06-28 DIAGNOSIS — I739 Peripheral vascular disease, unspecified: Secondary | ICD-10-CM | POA: Diagnosis not present

## 2021-06-28 DIAGNOSIS — L110 Acquired keratosis follicularis: Secondary | ICD-10-CM | POA: Diagnosis not present

## 2021-06-28 DIAGNOSIS — M79671 Pain in right foot: Secondary | ICD-10-CM | POA: Diagnosis not present

## 2021-06-28 DIAGNOSIS — M79672 Pain in left foot: Secondary | ICD-10-CM | POA: Diagnosis not present

## 2021-07-10 ENCOUNTER — Encounter: Payer: Self-pay | Admitting: Urology

## 2021-07-10 ENCOUNTER — Ambulatory Visit (INDEPENDENT_AMBULATORY_CARE_PROVIDER_SITE_OTHER): Payer: Medicare Other | Admitting: Urology

## 2021-07-10 ENCOUNTER — Other Ambulatory Visit: Payer: Self-pay

## 2021-07-10 VITALS — BP 122/64 | HR 70

## 2021-07-10 DIAGNOSIS — N390 Urinary tract infection, site not specified: Secondary | ICD-10-CM

## 2021-07-10 DIAGNOSIS — I701 Atherosclerosis of renal artery: Secondary | ICD-10-CM

## 2021-07-10 DIAGNOSIS — N401 Enlarged prostate with lower urinary tract symptoms: Secondary | ICD-10-CM

## 2021-07-10 DIAGNOSIS — R3912 Poor urinary stream: Secondary | ICD-10-CM | POA: Diagnosis not present

## 2021-07-10 DIAGNOSIS — N138 Other obstructive and reflux uropathy: Secondary | ICD-10-CM | POA: Diagnosis not present

## 2021-07-10 LAB — URINALYSIS, ROUTINE W REFLEX MICROSCOPIC
Bilirubin, UA: NEGATIVE
Glucose, UA: NEGATIVE
Nitrite, UA: NEGATIVE
RBC, UA: NEGATIVE
Specific Gravity, UA: 1.015 (ref 1.005–1.030)
Urobilinogen, Ur: 1 mg/dL (ref 0.2–1.0)
pH, UA: 8 — ABNORMAL HIGH (ref 5.0–7.5)

## 2021-07-10 LAB — MICROSCOPIC EXAMINATION
Bacteria, UA: NONE SEEN
RBC, Urine: NONE SEEN /hpf (ref 0–2)
Renal Epithel, UA: NONE SEEN /hpf

## 2021-07-10 LAB — BLADDER SCAN AMB NON-IMAGING: Scan Result: 22

## 2021-07-10 MED ORDER — ALFUZOSIN HCL ER 10 MG PO TB24: 10.0000 mg | ORAL_TABLET | Freq: Every day | ORAL | 11 refills | Status: DC

## 2021-07-10 MED ORDER — NITROFURANTOIN MACROCRYSTAL 50 MG PO CAPS: 50.0000 mg | ORAL_CAPSULE | Freq: Every day | ORAL | 11 refills | Status: DC

## 2021-07-10 NOTE — Progress Notes (Signed)
07/10/2021 1:38 PM   Julian Fowler 03/05/1935 [id number]  Referring provider: [name], MD [name],   [id number]  Followup BPH and recurrent    HPI: Julian Fowler is a 86yo here for followup for BPH with weak urinary stream and recurrent UTI. Since last visit he has been tred with 3 courses of antibiotics for UTIs. PVR 22cc. IPSS 14 QOL 5. He is unhappy with the urinary frequency. He takes lasix and has to urinate every 20-30 minutes. Urine stream strong. No urinary hesitancy. Last PSA was 3.0 in 08/2020. No other complaints today  PMH: Past Medical History:  Diagnosis Date   Allergic rhinitis    Aortic stenosis    Arthritis    Atrial fibrillation (HCC)    BPH (benign prostatic hyperplasia)    Coronary artery calcification seen on CT scan    Easy bruisability    ED (erectile dysfunction)    Essential hypertension    GERD (gastroesophageal reflux disease)    H/O hiatal hernia    Hearing loss in left ear    History of asbestosis    History of shingles    Hyperlipidemia    Overactive bladder    PSVT (paroxysmal supraventricular tachycardia) (HCC)    Sleep apnea    No longer on CPAP following weight loss   Tinnitus     Surgical History: Past Surgical History:  Procedure Laterality Date   BACK SURGERY  2014   herniated L1, L2   Dr Carloyn Manner   BRAIN SURGERY     CEREBRAL EMBOLIZATION  12/2011   "radiation therapy-did not work"   CHOLECYSTECTOMY  6/98   COLONOSCOPY  09/2009   Dr. Lindalou Hose: normal, internal hemorrhoids    COLONOSCOPY N/A 02/28/2017   Dr. Gala Romney: Hemorrhoids, grade 3, mild diverticulosis.   CRANIECTOMY FOR EXCISION OF ACOUSTIC NEUROMA  3/95   MINOR AMPUTATION OF DIGIT Left 12/31/2018   Procedure: REVISION AMPUTATION OF LEFT INDEX FINGER, IRRIGATION AND DEBRIDEMENT LEFT INDEX FINGER;  Surgeon: Verner Mould, MD;  Location: [facility];  Service: Orthopedics;  Laterality: Left;   RADIOLOGY WITH ANESTHESIA N/A 07/07/2014   Procedure:  EMBOLIZATION;  Surgeon: Rob Hickman, MD;  Location: [facility];  Service: Radiology;  Laterality: N/A;   TOTAL KNEE ARTHROPLASTY Left 07/06/2013   Procedure: LEFT TOTAL KNEE ARTHROPLASTY;  Surgeon: Gearlean Alf, MD;  Location: [facility];  Service: Orthopedics;  Laterality: Left;   TOTAL KNEE ARTHROPLASTY Right 01/18/2014   Procedure: RIGHT TOTAL KNEE ARTHROPLASTY;  Surgeon: Gearlean Alf, MD;  Location: [facility];  Service: Orthopedics;  Laterality: Right;   TRIGGER FINGER RELEASE  2003   (thumb) middle finger (2006)    Home Medications:  Allergies as of 07/10/2021       Reactions   Bactrim [sulfamethoxazole-trimethoprim]    Prednisone    Pt is high functioning and out of sorts.   Codeine Other (See Comments)   REACTION: groggy   Tape Other (See Comments)   Skin tears, use paper tape        Medication List        Accurate as of July 10, 2021  1:38 PM. If you have any questions, ask your nurse or doctor.          STOP taking these medications    Gemtesa 75 MG Tabs Generic drug: Vibegron Stopped by: Nicolette Bang, MD   nitrofurantoin (macrocrystal-monohydrate) 100 MG capsule Commonly known as: MACROBID Stopped by: Nicolette Bang, MD  TAKE these medications    acetaminophen 325 MG tablet Commonly known as: TYLENOL Take 325 mg by mouth as needed.   alfuzosin 10 MG 24 hr tablet Commonly known as: UROXATRAL Take 1 tablet (10 mg total) by mouth at bedtime.   azelastine 0.1 % nasal spray Commonly known as: ASTELIN 1-2 puffs each nostril every 8 hours if needed for drainage What changed:  when to take this reasons to take this   Benefiber Powd Take 1 packet by mouth 2 (two) times daily.   CENTRUM SILVER PO Take 1 tablet by mouth daily.   diclofenac Sodium 1 % Gel Commonly known as: VOLTAREN Apply 2 g topically daily as needed (For pain).   esomeprazole 40 MG capsule Commonly known as: NexIUM Take 1 capsule (40 mg total) by mouth 2 (two)  times daily before a meal.   fexofenadine 180 MG tablet Commonly known as: ALLEGRA Take 180 mg by mouth daily as needed for allergies.   fluticasone 50 MCG/ACT nasal spray Commonly known as: FLONASE Place 2 sprays into both nostrils daily. What changed:  when to take this reasons to take this   furosemide 20 MG tablet Commonly known as: LASIX Take 1 tablet (20 mg total) by mouth daily.   hydrALAZINE 50 MG tablet Commonly known as: APRESOLINE Take 1.5 tablets (75 mg total) by mouth 3 (three) times daily.   latanoprost 0.005 % ophthalmic solution Commonly known as: XALATAN Place 1 drop into both eyes at bedtime.   LORazepam 1 MG tablet Commonly known as: ATIVAN Take 1 tablet (1 mg total) by mouth 3 (three) times daily as needed for anxiety.   losartan 100 MG tablet Commonly known as: COZAAR Take 100 mg by mouth every morning.   metoprolol succinate 25 MG 24 hr tablet Commonly known as: TOPROL-XL Take 25 mg by mouth daily. 1/2 tablet daily   ondansetron 4 MG tablet Commonly known as: ZOFRAN Take 4 mg by mouth every 8 (eight) hours as needed.   Os-Cal Ultra 600 MG Tabs Take 1 tablet by mouth daily.   polyethylene glycol 17 g packet Commonly known as: MIRALAX / GLYCOLAX Take 17 g by mouth at bedtime.   potassium chloride SA 20 MEQ tablet Commonly known as: KLOR-CON Take 1 tablet by mouth twice daily   pravastatin 40 MG tablet Commonly known as: PRAVACHOL Take 1 tablet by mouth at bedtime.   ProAir HFA 108 (90 Base) MCG/ACT inhaler Generic drug: albuterol INHALE 2 PUFFS BY MOUTH EVERY 6 HOURS AS NEEDED FOR WHEEZING FOR SHORTNESS OF BREATH What changed: See the new instructions.   sodium chloride 1 g tablet Take 1 g by mouth 3 (three) times daily.   Symbicort 160-4.5 MCG/ACT inhaler Generic drug: budesonide-formoterol INHALE 2 PUFFS BY MOUTH  TWICE DAILY THEN  RINSE  MOUTH   SYSTANE OP Place 1 drop into both eyes daily.   Zinc 25 MG Tabs Take 1 tablet  by mouth daily.   zolpidem 10 MG tablet Commonly known as: AMBIEN Take 1 tablet by mouth at bedtime.        Allergies:  Allergies  Allergen Reactions   Bactrim [Sulfamethoxazole-Trimethoprim]    Prednisone     Pt is high functioning and out of sorts.   Codeine Other (See Comments)    REACTION: groggy   Tape Other (See Comments)    Skin tears, use paper tape    Family History: Family History  Problem Relation Age of Onset   Cancer Father  oral cancer   Breast cancer Mother    Heart attack Mother    Hypertension Sister        Bypass x4   Alcohol abuse Sister    Alzheimer's disease Sister    Kidney disease Sister    Colon cancer Neg Hx     Social History:  reports that he quit smoking about 34 years ago. His smoking use included cigarettes. He started smoking about 64 years ago. He has a 45.00 pack-year smoking history. He has never used smokeless tobacco. He reports that he does not drink alcohol and does not use drugs.  ROS: All other review of systems were reviewed and are negative except what is noted above in HPI  Physical Exam: BP 122/64   Pulse 70   Constitutional:  Alert and oriented, No acute distress. HEENT: [place] AT, moist mucus membranes.  Trachea midline, no masses. Cardiovascular: No clubbing, cyanosis, or edema. Respiratory: Normal respiratory effort, no increased work of breathing. GI: Abdomen is soft, nontender, nondistended, no abdominal masses GU: No CVA tenderness.  Lymph: No cervical or inguinal lymphadenopathy. Skin: No rashes, bruises or suspicious lesions. Neurologic: Grossly intact, no focal deficits, moving all 4 extremities. Psychiatric: Normal mood and affect.  Laboratory Data: Lab Results  Component Value Date   WBC 7.1 11/28/2020   HGB 11.3 (L) 11/28/2020   HCT 32.9 (L) 11/28/2020   MCV 90.6 11/28/2020   PLT 450 (H) 11/28/2020    Lab Results  Component Value Date   CREATININE 0.68 11/28/2020    Lab Results   Component Value Date   PSA 3.60 01/06/2009   PSA 3.05 03/25/2007    No results found for: TESTOSTERONE  No results found for: HGBA1C  Urinalysis    Component Value Date/Time   COLORURINE STRAW (A) 11/21/2020 1230   APPEARANCEUR Clear 06/07/2021 1325   LABSPEC 1.005 11/21/2020 1230   PHURINE 7.0 11/21/2020 1230   GLUCOSEU Negative 06/07/2021 1325   HGBUR NEGATIVE 11/21/2020 1230   HGBUR negative 10/27/2008 0838   BILIRUBINUR Negative 06/07/2021 1325   KETONESUR NEGATIVE 11/21/2020 1230   PROTEINUR Negative 06/07/2021 1325   PROTEINUR NEGATIVE 11/21/2020 1230   UROBILINOGEN 0.2 01/11/2014 1405   NITRITE Negative 06/07/2021 1325   NITRITE NEGATIVE 11/21/2020 1230   LEUKOCYTESUR 1+ (A) 06/07/2021 1325   LEUKOCYTESUR NEGATIVE 11/21/2020 1230    Lab Results  Component Value Date   LABMICR See below: 06/07/2021   WBCUA 11-30 (A) 06/07/2021   LABEPIT 0-10 06/07/2021   MUCUS Present 06/07/2021   BACTERIA Few 06/07/2021    Pertinent Imaging:  No results found for this or any previous visit.  No results found for this or any previous visit.  No results found for this or any previous visit.  No results found for this or any previous visit.  No results found for this or any previous visit.  No results found for this or any previous visit.  No results found for this or any previous visit.  No results found for this or any previous visit.   Assessment & Plan:    1. Recurrent UTI -we will start macrobid 50mg  qhs - Urinalysis, Routine w reflex microscopic - BLADDER SCAN AMB NON-IMAGING  2. Benign prostatic hyperplasia with urinary obstruction -continue uroxatral 10mg  qhs  3. Weak urinary stream -continue uroxatral 10mg  qhs   No follow-ups on file.  Nicolette Bang, MD  [facility] [place]

## 2021-07-10 NOTE — Patient Instructions (Signed)
Benign Prostatic Hyperplasia Benign prostatic hyperplasia (BPH) is an enlarged prostate gland that is caused by the normal aging process and not by cancer. The prostate is a walnut-sized gland that is involved in the production of semen. It is located in front of the rectum and below the bladder. The bladder stores urine and the urethra is the tube that carries the urine out of the body. The prostate may get bigger as a man gets older. An enlarged prostate can press on the urethra. This can make it harder to pass urine. The build-up of urine in the bladder can cause infection. Back pressure and infection may progress to bladder damage and kidney (renal) failure. What are the causes? This condition is part of a normal aging process. However, not all men develop problems from this condition. If the prostate enlarges away from the urethra, urine flow will not be blocked. If it enlarges toward the urethra and compresses it, there will be problems passing urine. What increases the risk? This condition is more likely to develop in men over the age of 50 years. What are the signs or symptoms? Symptoms of this condition include: Getting up often during the night to urinate. Needing to urinate frequently during the day. Difficulty starting urine flow. Decrease in size and strength of your urine stream. Leaking (dribbling) after urinating. Inability to pass urine. This needs immediate treatment. Inability to completely empty your bladder. Pain when you pass urine. This is more common if there is also an infection. Urinary tract infection (UTI). How is this diagnosed? This condition is diagnosed based on your medical history, a physical exam, and your symptoms. Tests will also be done, such as: A post-void bladder scan. This measures any amount of urine that may remain in your bladder after you finish urinating. A digital rectal exam. In a rectal exam, your health care provider checks your prostate by  putting a lubricated, gloved finger into your rectum to feel the back of your prostate gland. This exam detects the size of your gland and any abnormal lumps or growths. An exam of your urine (urinalysis). A prostate specific antigen (PSA) screening. This is a blood test used to screen for prostate cancer. An ultrasound. This test uses sound waves to electronically produce a picture of your prostate gland. Your health care provider may refer you to a specialist in kidney and prostate diseases (urologist). How is this treated? Once symptoms begin, your health care provider will monitor your condition (active surveillance or watchful waiting). Treatment for this condition will depend on the severity of your condition. Treatment may include: Observation and yearly exams. This may be the only treatment needed if your condition and symptoms are mild. Medicines to relieve your symptoms, including: Medicines to shrink the prostate. Medicines to relax the muscle of the prostate. Surgery in severe cases. Surgery may include: Prostatectomy. In this procedure, the prostate tissue is removed completely through an open incision or with a laparoscope or robotics. Transurethral resection of the prostate (TURP). In this procedure, a tool is inserted through the opening at the tip of the penis (urethra). It is used to cut away tissue of the inner core of the prostate. The pieces are removed through the same opening of the penis. This removes the blockage. Transurethral incision (TUIP). In this procedure, small cuts are made in the prostate. This lessens the prostate's pressure on the urethra. Transurethral microwave thermotherapy (TUMT). This procedure uses microwaves to create heat. The heat destroys and removes a   small amount of prostate tissue. Transurethral needle ablation (TUNA). This procedure uses radio frequencies to destroy and remove a small amount of prostate tissue. Interstitial laser coagulation (ILC).  This procedure uses a laser to destroy and remove a small amount of prostate tissue. Transurethral electrovaporization (TUVP). This procedure uses electrodes to destroy and remove a small amount of prostate tissue. Prostatic urethral lift. This procedure inserts an implant to push the lobes of the prostate away from the urethra. Follow these instructions at home: Take over-the-counter and prescription medicines only as told by your health care provider. Monitor your symptoms for any changes. Contact your health care provider with any changes. Avoid drinking large amounts of liquid before going to bed or out in public. Avoid or reduce how much caffeine or alcohol you drink. Give yourself time when you urinate. Keep all follow-up visits as told by your health care provider. This is important. Contact a health care provider if: You have unexplained back pain. Your symptoms do not get better with treatment. You develop side effects from the medicine you are taking. Your urine becomes very dark or has a bad smell. Your lower abdomen becomes distended and you have trouble passing your urine. Get help right away if: You have a fever or chills. You suddenly cannot urinate. You feel lightheaded, or very dizzy, or you faint. There are large amounts of blood or clots in the urine. Your urinary problems become hard to manage. You develop moderate to severe low back or flank pain. The flank is the side of your body between the ribs and the hip. These symptoms may represent a serious problem that is an emergency. Do not wait to see if the symptoms will go away. Get medical help right away. Call your local emergency services (911 in the [place] Do not drive yourself to the hospital. Summary Benign prostatic hyperplasia (BPH) is an enlarged prostate that is caused by the normal aging process and not by cancer. An enlarged prostate can press on the urethra. This can make it hard to pass urine. This  condition is part of a normal aging process and is more likely to develop in men over the age of 50 years. Get help right away if you suddenly cannot urinate. This information is not intended to replace advice given to you by your health care provider. Make sure you discuss any questions you have with your health care provider. Document Revised: 01/11/2021 Document Reviewed: 06/09/2020 Elsevier Patient Education  2022 [employer]  

## 2021-07-10 NOTE — Progress Notes (Signed)
post void residual=22  Urological Symptom Review  Patient is experiencing the following symptoms: Frequent urination Hard to postpone urination Get up at night to urinate Urinary tract infection Erection problems (male only)   Review of Systems  Gastrointestinal (upper)  : Negative for upper GI symptoms  Gastrointestinal (lower) : Negative for lower GI symptoms  Constitutional : Fatigue  Skin: Negative for skin symptoms  Eyes: Negative for eye symptoms  Ear/Nose/Throat : Negative for Ear/Nose/Throat symptoms  Hematologic/Lymphatic: Easy bruising  Cardiovascular : Leg swelling  Respiratory : Negative for respiratory symptoms  Endocrine: Negative for endocrine symptoms  Musculoskeletal: Negative for musculoskeletal symptoms  Neurological: Negative for neurological symptoms  Psychologic: Negative for psychiatric symptoms

## 2021-07-25 ENCOUNTER — Ambulatory Visit: Payer: Medicare Other | Admitting: Cardiology

## 2021-07-27 DIAGNOSIS — E876 Hypokalemia: Secondary | ICD-10-CM | POA: Diagnosis not present

## 2021-07-27 DIAGNOSIS — I10 Essential (primary) hypertension: Secondary | ICD-10-CM | POA: Diagnosis not present

## 2021-07-27 DIAGNOSIS — R7301 Impaired fasting glucose: Secondary | ICD-10-CM | POA: Diagnosis not present

## 2021-07-31 ENCOUNTER — Ambulatory Visit: Payer: Medicare Other | Admitting: Internal Medicine

## 2021-08-02 DIAGNOSIS — Z23 Encounter for immunization: Secondary | ICD-10-CM | POA: Diagnosis not present

## 2021-08-02 DIAGNOSIS — I350 Nonrheumatic aortic (valve) stenosis: Secondary | ICD-10-CM | POA: Diagnosis not present

## 2021-08-02 DIAGNOSIS — J4520 Mild intermittent asthma, uncomplicated: Secondary | ICD-10-CM | POA: Diagnosis not present

## 2021-08-02 DIAGNOSIS — M21371 Foot drop, right foot: Secondary | ICD-10-CM | POA: Diagnosis not present

## 2021-08-02 DIAGNOSIS — I4819 Other persistent atrial fibrillation: Secondary | ICD-10-CM | POA: Diagnosis not present

## 2021-08-02 DIAGNOSIS — E871 Hypo-osmolality and hyponatremia: Secondary | ICD-10-CM | POA: Diagnosis not present

## 2021-08-02 DIAGNOSIS — I701 Atherosclerosis of renal artery: Secondary | ICD-10-CM | POA: Diagnosis not present

## 2021-08-02 DIAGNOSIS — I10 Essential (primary) hypertension: Secondary | ICD-10-CM | POA: Diagnosis not present

## 2021-08-02 NOTE — Progress Notes (Signed)
HPI male former smoker followed for allergic rhinitis, asthma,  history OSA, asbestos exposure/plaques, chronic insomnia Office spirometry 05/03/15- WNL- FVC 3.33/ 78%, FEV1 2.49/ 78%, r 0.75, FEF25-75% 1.92/ 69%.. -----------------------------------------------------------------------------------------------  01/30/21-  86 year old male Former Smoker followed for allergic Rhinitis, Asthma,  history OSA/ weight loss, Asbestos exposure/plaques/ interstitial lung disease, chronic insomnia, Diverticulitis, SVT, AFib, HTN, GERD, Hyponatremia, -Azelastine nasal, Symbicort 160, Flonase, ProAir hfa,  Hosp in Feb with hyponatremia and new-onset AFib (no anticoag due to falls risk), rib fxs. Covid vax-3 Phizer Flu vax-had Being followed by PCP and Renal in [place] for sodium.  ACT score - 19 Breathing stable with current inhalers. Denies acute respiratory issue after recovering from fall with rib fx's. CT shows plaques and ILD c/w old asbestos exposure, but not progressive. CT chest/abd/pelvis 11/21/20- IMPRESSION: 1. Several nondisplaced left anterior rib fractures. No pneumothorax. 2. No acute/traumatic intra-abdominal or pelvic pathology. 3. Aortic Atherosclerosis (ICD10-I70.0).  08/03/21- 86 year old male Former Smoker followed for allergic Rhinitis, Asthma,  history OSA/ weight loss, Asbestos exposure/plaques/ interstitial lung disease, chronic insomnia, Diverticulitis, SVT, AFib, HTN, GERD, Hyponatremia, -Azelastine nasal, Symbicort 160, Flonase, ProAir hfa,  Covid vax - 1 Phizer, 2 Moderna Flu vax-had Body weight today-245 lbs                     Wife here -----Follow up on pt.'s lungs. Aware he gets less exercise, put on weight and some increased DOE. No acute events, recent infections or cough, may occ wheeze a little. Symbicort good, rarely needs albuterol. Discussed pneumonia vaccines and agreed ok for now. Consider Prevnar 20 in future. PCP watching glucose and Na.   ROS See HPI  + =  positive Constitutional:   No-   weight loss, night sweats, fevers, chills, fatigue, lassitude. HEENT:   + headaches,  No -difficulty swallowing, tooth/dental problems, sore throat,       No-  sneezing, itching, ear ache,  +nasal congestion, +post nasal drip,  CV:  + chest pain, orthopnea, PND, swelling in lower extremities, no-anasarca, dizziness, palpitations Resp: No-   shortness of breath with exertion or at rest.                productive cough,  No non-productive cough,  No-  coughing up of blood.              No-   change in color of mucus.  Occ wheezing.   Skin: No-   rash or lesions. GI:  No-   heartburn, indigestion, abdominal pain, nausea, vomiting,             GU:  MS:  + joint pain or swelling. .  + back pain. Neuro-  nothing unusual  Psych:  No- change in mood or affect. No depression or anxiety.  No memory loss.  OBJ  General- Alert, Oriented, Affect-appropriate, Distress- none acute, + obese,  Skin- rash-none, lesions- none, excoriation- none,  Lymphadenopathy- none Head- atraumatic            Eyes- Gross vision intact, PERRLA, conjunctivae clear secretions            Ears- +hard of hearing/hearing aid            Nose- + turbinate edema, no-Septal dev, mucus, polyps, erosion, perforation             Throat- Mallampati II , mucosa clear , drainage- none, tonsils- atrophic. Dental repair. Neck- flexible , trachea midline, no stridor , thyroid nl, carotid no  bruit Chest - symmetrical excursion , unlabored           Heart/CV-  RRR (hx AFib) , +2/6 S murmur , no gallop  , no rub, nl s1 s2                           - JVD- none , edema + chronic right lower leg, varices- none           Lungs-  clear,  dullness-none, rub- none.            Chest wall- no rub heard Abd-  Br/ Gen/ Rectal- Not done, not indicated Extrem-  Neuro- grossly intact to observation. Speech is clear.

## 2021-08-03 ENCOUNTER — Encounter: Payer: Self-pay | Admitting: Internal Medicine

## 2021-08-03 ENCOUNTER — Ambulatory Visit (INDEPENDENT_AMBULATORY_CARE_PROVIDER_SITE_OTHER): Payer: Medicare Other | Admitting: Internal Medicine

## 2021-08-03 ENCOUNTER — Other Ambulatory Visit: Payer: Self-pay

## 2021-08-03 DIAGNOSIS — J4520 Mild intermittent asthma, uncomplicated: Secondary | ICD-10-CM

## 2021-08-03 DIAGNOSIS — I701 Atherosclerosis of renal artery: Secondary | ICD-10-CM

## 2021-08-03 DIAGNOSIS — J61 Pneumoconiosis due to asbestos and other mineral fibers: Secondary | ICD-10-CM | POA: Diagnosis not present

## 2021-08-03 NOTE — Assessment & Plan Note (Signed)
Age and deconditioning are catching up with him. No acute issue noted. Vaccine status reviewed.

## 2021-08-03 NOTE — Patient Instructions (Signed)
We can continue current meds  Please call if we can help 

## 2021-08-03 NOTE — Assessment & Plan Note (Signed)
No progressive ILD- reference CT chest 11/21/20 Plan- long term surveillance

## 2021-08-15 DIAGNOSIS — M79671 Pain in right foot: Secondary | ICD-10-CM | POA: Diagnosis not present

## 2021-08-15 DIAGNOSIS — M1990 Unspecified osteoarthritis, unspecified site: Secondary | ICD-10-CM | POA: Diagnosis not present

## 2021-08-15 DIAGNOSIS — M779 Enthesopathy, unspecified: Secondary | ICD-10-CM | POA: Diagnosis not present

## 2021-08-19 ENCOUNTER — Other Ambulatory Visit: Payer: Self-pay | Admitting: Internal Medicine

## 2021-08-22 ENCOUNTER — Ambulatory Visit: Payer: Medicare Other | Admitting: Internal Medicine

## 2021-09-04 DIAGNOSIS — H401211 Low-tension glaucoma, right eye, mild stage: Secondary | ICD-10-CM | POA: Diagnosis not present

## 2021-09-12 DIAGNOSIS — M79675 Pain in left toe(s): Secondary | ICD-10-CM | POA: Diagnosis not present

## 2021-09-12 DIAGNOSIS — M79672 Pain in left foot: Secondary | ICD-10-CM | POA: Diagnosis not present

## 2021-09-12 DIAGNOSIS — I739 Peripheral vascular disease, unspecified: Secondary | ICD-10-CM | POA: Diagnosis not present

## 2021-09-12 DIAGNOSIS — L110 Acquired keratosis follicularis: Secondary | ICD-10-CM | POA: Diagnosis not present

## 2021-09-12 DIAGNOSIS — M79674 Pain in right toe(s): Secondary | ICD-10-CM | POA: Diagnosis not present

## 2021-09-12 DIAGNOSIS — M79671 Pain in right foot: Secondary | ICD-10-CM | POA: Diagnosis not present

## 2021-09-16 ENCOUNTER — Other Ambulatory Visit: Payer: Self-pay | Admitting: Internal Medicine

## 2021-09-20 NOTE — Telephone Encounter (Signed)
Ativan refilled.

## 2021-09-21 ENCOUNTER — Ambulatory Visit (INDEPENDENT_AMBULATORY_CARE_PROVIDER_SITE_OTHER): Payer: Medicare Other

## 2021-09-21 ENCOUNTER — Other Ambulatory Visit: Payer: Self-pay

## 2021-09-21 DIAGNOSIS — I701 Atherosclerosis of renal artery: Secondary | ICD-10-CM

## 2021-09-25 ENCOUNTER — Telehealth: Payer: Self-pay | Admitting: *Deleted

## 2021-09-25 NOTE — Telephone Encounter (Signed)
-----   Message from Merlene Laughter, RN sent at 09/25/2021  4:22 PM EST -----  ----- Message ----- From: Satira Sark, MD Sent: 09/22/2021   5:36 PM EST To: Merlene Laughter, RN  Results reviewed. No evidence of renal artery stenosis.

## 2021-09-25 NOTE — Telephone Encounter (Signed)
Patient informed. Copy sent to PCP °

## 2021-10-03 ENCOUNTER — Ambulatory Visit (INDEPENDENT_AMBULATORY_CARE_PROVIDER_SITE_OTHER): Payer: Medicare Other | Admitting: Internal Medicine

## 2021-10-03 ENCOUNTER — Encounter: Payer: Self-pay | Admitting: Internal Medicine

## 2021-10-03 ENCOUNTER — Other Ambulatory Visit: Payer: Self-pay

## 2021-10-03 VITALS — BP 155/90 | HR 73 | Temp 97.0°F | Ht 69.0 in | Wt 243.4 lb

## 2021-10-03 DIAGNOSIS — K5909 Other constipation: Secondary | ICD-10-CM

## 2021-10-03 DIAGNOSIS — K219 Gastro-esophageal reflux disease without esophagitis: Secondary | ICD-10-CM | POA: Diagnosis not present

## 2021-10-03 DIAGNOSIS — I701 Atherosclerosis of renal artery: Secondary | ICD-10-CM

## 2021-10-03 NOTE — Patient Instructions (Signed)
It was good to see you again today!  Continue esomeprazole 40 mg before breakfast and supper (dispense 180 with 3 refills)  Continue to use MiraLAX 17 g orally at nightly on any day without a bowel movement.  We want at least 1 bowel movement every other day but no more than 3 bowel movements daily.  GERD and constipation information provided  Plan to see back in the office in 1 year and as needed

## 2021-10-03 NOTE — Progress Notes (Signed)
Primary Care Physician:  [name], MD Primary Gastroenterologist:  Dr.   Pre-Procedure History & Physical: HPI:  Julian Fowler is a 86 y.o. male here for follow-up of GERD and constipation.  Patient using MiraLAX nightly to facilitate bowel function fo daily to every other day movement.  Reflux symptoms well controlled on esomeprazole 40 mg before meals and at bedtime.  No dysphagia.  Hospitalized earlier in the this year with hyponatremia.  Past Medical History:  Diagnosis Date   Allergic rhinitis    Aortic stenosis    Arthritis    Atrial fibrillation (HCC)    BPH (benign prostatic hyperplasia)    Coronary artery calcification seen on CT scan    Easy bruisability    ED (erectile dysfunction)    Essential hypertension    GERD (gastroesophageal reflux disease)    H/O hiatal hernia    Hearing loss in left ear    History of asbestosis    History of shingles    Hyperlipidemia    Overactive bladder    PSVT (paroxysmal supraventricular tachycardia) (HCC)    Sleep apnea    No longer on CPAP following weight loss   Tinnitus     Past Surgical History:  Procedure Laterality Date   BACK SURGERY  2014   herniated L1, L2   Dr Carloyn Manner   BRAIN SURGERY     CEREBRAL EMBOLIZATION  12/2011   "radiation therapy-did not work"   CHOLECYSTECTOMY  6/98   COLONOSCOPY  09/2009   Dr. Lindalou Hose: normal, internal hemorrhoids    COLONOSCOPY N/A 02/28/2017   Dr. Gala Romney: Hemorrhoids, grade 3, mild diverticulosis.   CRANIECTOMY FOR EXCISION OF ACOUSTIC NEUROMA  3/95   MINOR AMPUTATION OF DIGIT Left 12/31/2018   Procedure: REVISION AMPUTATION OF LEFT INDEX FINGER, IRRIGATION AND DEBRIDEMENT LEFT INDEX FINGER;  Surgeon: Verner Mould, MD;  Location: [facility];  Service: Orthopedics;  Laterality: Left;   RADIOLOGY WITH ANESTHESIA N/A 07/07/2014   Procedure: EMBOLIZATION;  Surgeon: Rob Hickman, MD;  Location: [facility];  Service: Radiology;  Laterality: N/A;   TOTAL KNEE ARTHROPLASTY Left  07/06/2013   Procedure: LEFT TOTAL KNEE ARTHROPLASTY;  Surgeon: Gearlean Alf, MD;  Location: [facility];  Service: Orthopedics;  Laterality: Left;   TOTAL KNEE ARTHROPLASTY Right 01/18/2014   Procedure: RIGHT TOTAL KNEE ARTHROPLASTY;  Surgeon: Gearlean Alf, MD;  Location: [facility];  Service: Orthopedics;  Laterality: Right;   TRIGGER FINGER RELEASE  2003   (thumb) middle finger (2006)    Prior to Admission medications   Medication Sig Start Date End Date Taking? Authorizing Provider  acetaminophen (TYLENOL) 325 MG tablet Take 325 mg by mouth as needed.   Yes [provider]  alfuzosin (UROXATRAL) 10 MG 24 hr tablet Take 1 tablet (10 mg total) by mouth at bedtime. 07/10/21  Yes McKenzie, Candee Furbish, MD  azelastine (ASTELIN) 0.1 % nasal spray 1-2 puffs each nostril every 8 hours if needed for drainage Patient taking differently: as needed. 1-2 puffs each nostril every 8 hours if needed for drainage 12/17/17  Yes Young, Tarri Fuller D, MD  Calcium Carb-Vit D-C-E-Mineral (OS-CAL ULTRA) 600 MG TABS Take 1 tablet by mouth daily.    Yes [provider]  diclofenac Sodium (VOLTAREN) 1 % GEL Apply 2 g topically daily as needed (For pain). 03/08/20  Yes [provider]  esomeprazole (NEXIUM) 40 MG capsule TAKE 1 CAPSULE BY MOUTH TWICE DAILY BEFORE A MEAL 08/21/21  Yes Cyndi Bender,  Leandra Kern, NP  fexofenadine (ALLEGRA) 180 MG tablet Take 180 mg by mouth daily as needed for allergies.   Yes [provider]  fluticasone (FLONASE) 50 MCG/ACT nasal spray Place 2 sprays into both nostrils daily. Patient taking differently: Place 2 sprays into both nostrils daily as needed for allergies. 05/03/16  Yes Young, Tarri Fuller D, MD  furosemide (LASIX) 20 MG tablet Take 1 tablet (20 mg total) by mouth daily. 11/28/20  Yes Shah, Pratik D, DO  hydrALAZINE (APRESOLINE) 50 MG tablet Take 1.5 tablets (75 mg total) by mouth 3 (three) times daily. 01/09/21  Yes Satira Sark, MD  latanoprost (XALATAN) 0.005 %  ophthalmic solution Place 1 drop into both eyes at bedtime.  09/01/12  Yes [provider]  LORazepam (ATIVAN) 1 MG tablet TAKE 1 TABLET BY MOUTH THREE TIMES DAILY AS NEEDED FOR ANXIETY 09/20/21  Yes Young, Clinton D, MD  losartan (COZAAR) 100 MG tablet Take 100 mg by mouth every morning.    Yes [provider]  metoprolol succinate (TOPROL-XL) 25 MG 24 hr tablet Take 25 mg by mouth daily. 1/2 tablet daily   Yes [provider]  Multiple Vitamins-Minerals (CENTRUM SILVER PO) Take 1 tablet by mouth daily.    Yes [provider]  nitrofurantoin (MACRODANTIN) 50 MG capsule Take 1 capsule (50 mg total) by mouth at bedtime. 07/10/21  Yes McKenzie, Candee Furbish, MD  ondansetron (ZOFRAN) 4 MG tablet Take 4 mg by mouth every 8 (eight) hours as needed. 11/16/20  Yes [provider]  Polyethyl Glycol-Propyl Glycol (SYSTANE OP) Place 1 drop into both eyes daily.   Yes [provider]  polyethylene glycol (MIRALAX / GLYCOLAX) packet Take 17 g by mouth at bedtime.   Yes [provider]  potassium chloride SA (KLOR-CON) 20 MEQ tablet Take 1 tablet by mouth twice daily 06/12/21  Yes Satira Sark, MD  pravastatin (PRAVACHOL) 40 MG tablet Take 1 tablet by mouth at bedtime.  10/05/14  Yes [provider]  PROAIR HFA 108 (90 Base) MCG/ACT inhaler INHALE 2 PUFFS BY MOUTH EVERY 6 HOURS AS NEEDED FOR WHEEZING FOR SHORTNESS OF BREATH Patient taking differently: Inhale 1 puff into the lungs every 6 (six) hours as needed. 03/18/20  Yes Young, Clinton D, MD  sodium chloride 1 g tablet Take 1 g by mouth 3 (three) times daily. 02/08/21  Yes [provider]  SYMBICORT 160-4.5 MCG/ACT inhaler INHALE 2 PUFFS BY MOUTH  TWICE DAILY THEN  RINSE  MOUTH 01/31/21  Yes Young, Clinton D, MD  Wheat Dextrin (BENEFIBER) POWD Take 1 packet by mouth 2 (two) times daily.   Yes [provider]  Zinc 25 MG TABS Take 1 tablet by mouth daily.   Yes [provider]  zolpidem (AMBIEN) 10 MG tablet Take 1 tablet by mouth at bedtime.  01/24/16  Yes [provider]    Allergies as of 10/03/2021 - Review Complete 10/03/2021  Allergen Reaction Noted   Bactrim [sulfamethoxazole-trimethoprim]  12/27/2020   Prednisone  12/17/2017   Codeine Other (See Comments)    Tape Other (See Comments) 04/10/2012    Family History  Problem Relation Age of Onset   Cancer Father        oral cancer   Breast cancer Mother    Heart attack Mother    Hypertension Sister        Bypass x4   Alcohol abuse Sister    Alzheimer's disease Sister    Kidney disease  Sister    Colon cancer Neg Hx     Social History   Socioeconomic History   Marital status: Married    Spouse name: Not on file   Number of children: Not on file   Years of education: Not on file   Highest education level: Not on file  Occupational History   Not on file  Tobacco Use   Smoking status: Former    Packs/day: 1.50    Years: 30.00    Pack years: 45.00    Types: Cigarettes    Start date: 04/24/1957    Quit date: 10/15/1986    Years since quitting: 34.9   Smokeless tobacco: Never  Vaping Use   Vaping Use: Never used  Substance and Sexual Activity   Alcohol use: No    Alcohol/week: 0.0 standard drinks    Comment: Used to drink heavily at times   Drug use: No   Sexual activity: Not on file  Other Topics Concern   Not on file  Social History Narrative   Co-dependent relationship with his 37 year old son who has drug and financial problems   Recently married to [place] on 01/02/2014   Social Determinants of Health   Financial Resource Strain: Not on file  Food Insecurity: Not on file  Transportation Needs: Not on file  Physical Activity: Not on file  Stress: Not on file  Social Connections: Not on file  Intimate Partner Violence: Not on file    Review of Systems: See HPI, otherwise negative ROS  Physical Exam: BP (!) 210/110    Pulse 73    Temp (!) 97 F (36.1  C)    Ht 5\' 9"  (1.753 m)    Wt 243 lb 6.4 oz (110.4 kg)    BMI 35.94 kg/m  General:   Alert,   pleasant and cooperative in NAD;   Neck:  Supple; no masses or thyromegaly. No significant cervical adenopathy. Lungs:  Clear throughout to auscultation.   No wheezes, crackles, or rhonchi. No acute distress. Heart:  Regular rate and rhythm; no murmurs, clicks, rubs,  or gallops. Abdomen: Non-distended, normal bowel sounds.  Soft and nontender without appreciable mass or hepatosplenomegaly.  Pulses:  Normal pulses noted. Extremities:  Without clubbing or edema.  Impression/Plan:  Pleasant 86 year old gentleman with longstanding GERD and constipation.  Historically, has required esomeprazole 40 mg daily for adequate control of reflux symptoms.  No alarm symptoms at this time.  Constipation responsive to MiraLAX.  As discussed with him, titration needed from time to time to achieve optimal bowel function.  Recommendations:  Continue esomeprazole 40 mg before breakfast and supper (dispense 180 with 3 refills)  Continue to use MiraLAX 17 g orally at nightly on any day without a bowel movement.  We want at least 1 bowel movement every other day but no more than 3 bowel movements daily.  GERD and constipation information provided  Plan to see back in the office in 1 year and as needed  Addendum: Initial BP in the office today 210/110; after the visit, it was rechecked 155/90.  Patient urged to follow-up regularly with PCP  Notice: This dictation was prepared with Dragon dictation along with smaller phrase technology. Any transcriptional errors that result from this process are unintentional and may not be corrected upon review.

## 2021-10-11 ENCOUNTER — Encounter: Payer: Self-pay | Admitting: Urology

## 2021-10-11 ENCOUNTER — Ambulatory Visit (INDEPENDENT_AMBULATORY_CARE_PROVIDER_SITE_OTHER): Payer: Medicare Other | Admitting: Urology

## 2021-10-11 ENCOUNTER — Other Ambulatory Visit: Payer: Self-pay

## 2021-10-11 VITALS — BP 175/77 | HR 73

## 2021-10-11 DIAGNOSIS — I701 Atherosclerosis of renal artery: Secondary | ICD-10-CM

## 2021-10-11 DIAGNOSIS — N401 Enlarged prostate with lower urinary tract symptoms: Secondary | ICD-10-CM

## 2021-10-11 DIAGNOSIS — N390 Urinary tract infection, site not specified: Secondary | ICD-10-CM

## 2021-10-11 DIAGNOSIS — N138 Other obstructive and reflux uropathy: Secondary | ICD-10-CM | POA: Diagnosis not present

## 2021-10-11 DIAGNOSIS — R3912 Poor urinary stream: Secondary | ICD-10-CM

## 2021-10-11 LAB — MICROSCOPIC EXAMINATION
Bacteria, UA: NONE SEEN
RBC, Urine: NONE SEEN /hpf (ref 0–2)
Renal Epithel, UA: NONE SEEN /hpf

## 2021-10-11 LAB — URINALYSIS, ROUTINE W REFLEX MICROSCOPIC
Bilirubin, UA: NEGATIVE
Glucose, UA: NEGATIVE
Nitrite, UA: NEGATIVE
RBC, UA: NEGATIVE
Specific Gravity, UA: 1.015 (ref 1.005–1.030)
Urobilinogen, Ur: 1 mg/dL (ref 0.2–1.0)
pH, UA: 8.5 — ABNORMAL HIGH (ref 5.0–7.5)

## 2021-10-11 LAB — BLADDER SCAN AMB NON-IMAGING

## 2021-10-11 MED ORDER — NITROFURANTOIN MACROCRYSTAL 50 MG PO CAPS
50.0000 mg | ORAL_CAPSULE | Freq: Every day | ORAL | 11 refills | Status: DC
Start: 2021-10-11 — End: 2022-10-29

## 2021-10-11 MED ORDER — ALFUZOSIN HCL ER 10 MG PO TB24: 10.0000 mg | ORAL_TABLET | Freq: Every day | ORAL | 11 refills | Status: DC

## 2021-10-11 NOTE — Progress Notes (Signed)
10/11/2021 1:29 PM   Julian Fowler Jul 02, 1935 [id number]  Referring provider: [name], MD [place],  [place] [address]  Followup recurrent UTI and urinary frequency   HPI: Julian Fowler is a 86yo here for followup for recurrent UTI and urinary frequency. He has previously on mirabegron and gemtesa which failed to improve his urinary frequency. He urinates every 20-30 minutes. He has glaucoma. He is very unhappy with his urination. He takes lasix which makes his frequency worse. Urinary stream good with uroxatral 10mg  qhs. No UTIS since last visit. Patient is currently on macrobid 50mg  qhs.    PMH: Past Medical History:  Diagnosis Date   Allergic rhinitis    Aortic stenosis    Arthritis    Atrial fibrillation (HCC)    BPH (benign prostatic hyperplasia)    Coronary artery calcification seen on CT scan    Easy bruisability    ED (erectile dysfunction)    Essential hypertension    GERD (gastroesophageal reflux disease)    H/O hiatal hernia    Hearing loss in left ear    History of asbestosis    History of shingles    Hyperlipidemia    Overactive bladder    PSVT (paroxysmal supraventricular tachycardia) (HCC)    Sleep apnea    No longer on CPAP following weight loss   Tinnitus     Surgical History: Past Surgical History:  Procedure Laterality Date   BACK SURGERY  2014   herniated L1, L2   Dr Julian Fowler   BRAIN SURGERY     CEREBRAL EMBOLIZATION  12/2011   "radiation therapy-did not work"   CHOLECYSTECTOMY  6/98   COLONOSCOPY  09/2009   Dr. Lindalou Fowler: normal, internal hemorrhoids    COLONOSCOPY N/A 02/28/2017   Dr. Gala Fowler: Hemorrhoids, grade 3, mild diverticulosis.   CRANIECTOMY FOR EXCISION OF ACOUSTIC NEUROMA  3/95   MINOR AMPUTATION OF DIGIT Left 12/31/2018   Procedure: REVISION AMPUTATION OF LEFT INDEX FINGER, IRRIGATION AND DEBRIDEMENT LEFT INDEX FINGER;  Surgeon: Julian Mould, MD;  Location: [facility];  Service: Orthopedics;  Laterality: Left;    RADIOLOGY WITH ANESTHESIA N/A 07/07/2014   Procedure: EMBOLIZATION;  Surgeon: Julian Hickman, MD;  Location: [facility];  Service: Radiology;  Laterality: N/A;   TOTAL KNEE ARTHROPLASTY Left 07/06/2013   Procedure: LEFT TOTAL KNEE ARTHROPLASTY;  Surgeon: Julian Alf, MD;  Location: [facility];  Service: Orthopedics;  Laterality: Left;   TOTAL KNEE ARTHROPLASTY Right 01/18/2014   Procedure: RIGHT TOTAL KNEE ARTHROPLASTY;  Surgeon: Julian Alf, MD;  Location: [facility];  Service: Orthopedics;  Laterality: Right;   TRIGGER FINGER RELEASE  2003   (thumb) middle finger (2006)    Home Medications:  Allergies as of 10/11/2021       Reactions   Bactrim [sulfamethoxazole-trimethoprim]    Prednisone    Pt is high functioning and out of sorts.   Codeine Other (See Comments)   REACTION: groggy   Tape Other (See Comments)   Skin tears, use paper tape        Medication List        Accurate as of October 11, 2021  1:29 PM. If you have any questions, ask your nurse or doctor.          acetaminophen 325 MG tablet Commonly known as: TYLENOL Take 325 mg by mouth as needed.   alfuzosin 10 MG 24 hr tablet Commonly known as: UROXATRAL Take 1 tablet (10 mg  total) by mouth at bedtime.   azelastine 0.1 % nasal spray Commonly known as: ASTELIN 1-2 puffs each nostril every 8 hours if needed for drainage What changed:  when to take this reasons to take this   Benefiber Powd Take 1 packet by mouth 2 (two) times daily.   CENTRUM SILVER PO Take 1 tablet by mouth daily.   diclofenac Sodium 1 % Gel Commonly known as: VOLTAREN Apply 2 g topically daily as needed (For pain).   esomeprazole 40 MG capsule Commonly known as: NEXIUM TAKE 1 CAPSULE BY MOUTH TWICE DAILY BEFORE A MEAL   fexofenadine 180 MG tablet Commonly known as: ALLEGRA Take 180 mg by mouth daily as needed for allergies.   fluticasone 50 MCG/ACT nasal spray Commonly known as: FLONASE Place 2 sprays into both nostrils  daily. What changed:  when to take this reasons to take this   furosemide 20 MG tablet Commonly known as: LASIX Take 1 tablet (20 mg total) by mouth daily.   hydrALAZINE 50 MG tablet Commonly known as: APRESOLINE Take 1.5 tablets (75 mg total) by mouth 3 (three) times daily.   latanoprost 0.005 % ophthalmic solution Commonly known as: XALATAN Place 1 drop into both eyes at bedtime.   LORazepam 1 MG tablet Commonly known as: ATIVAN TAKE 1 TABLET BY MOUTH THREE TIMES DAILY AS NEEDED FOR ANXIETY   losartan 100 MG tablet Commonly known as: COZAAR Take 100 mg by mouth every morning.   metoprolol succinate 25 MG 24 hr tablet Commonly known as: TOPROL-XL Take 25 mg by mouth daily. 1/2 tablet daily   nitrofurantoin 50 MG capsule Commonly known as: MACRODANTIN Take 1 capsule (50 mg total) by mouth at bedtime.   ondansetron 4 MG tablet Commonly known as: ZOFRAN Take 4 mg by mouth every 8 (eight) hours as needed.   Os-Cal Ultra 600 MG Tabs Take 1 tablet by mouth daily.   polyethylene glycol 17 g packet Commonly known as: MIRALAX / GLYCOLAX Take 17 g by mouth at bedtime.   potassium chloride SA 20 MEQ tablet Commonly known as: KLOR-CON M Take 1 tablet by mouth twice daily   pravastatin 40 MG tablet Commonly known as: PRAVACHOL Take 1 tablet by mouth at bedtime.   ProAir HFA 108 (90 Base) MCG/ACT inhaler Generic drug: albuterol INHALE 2 PUFFS BY MOUTH EVERY 6 HOURS AS NEEDED FOR WHEEZING FOR SHORTNESS OF BREATH What changed: See the new instructions.   sodium chloride 1 g tablet Take 1 g by mouth 3 (three) times daily.   Symbicort 160-4.5 MCG/ACT inhaler Generic drug: budesonide-formoterol INHALE 2 PUFFS BY MOUTH  TWICE DAILY THEN  RINSE  MOUTH   SYSTANE OP Place 1 drop into both eyes daily.   Zinc 25 MG Tabs Take 1 tablet by mouth daily.   zolpidem 10 MG tablet Commonly known as: AMBIEN Take 1 tablet by mouth at bedtime.        Allergies:  Allergies   Allergen Reactions   Bactrim [Sulfamethoxazole-Trimethoprim]    Prednisone     Pt is high functioning and out of sorts.   Codeine Other (See Comments)    REACTION: groggy   Tape Other (See Comments)    Skin tears, use paper tape    Family History: Family History  Problem Relation Age of Onset   Cancer Father        oral cancer   Breast cancer Mother    Heart attack Mother    Hypertension Sister  Bypass x4   Alcohol abuse Sister    Alzheimer's disease Sister    Kidney disease Sister    Colon cancer Neg Hx     Social History:  reports that he quit smoking about 35 years ago. His smoking use included cigarettes. He started smoking about 64 years ago. He has a 45.00 pack-year smoking history. He has never used smokeless tobacco. He reports that he does not drink alcohol and does not use drugs.  ROS: All other review of systems were reviewed and are negative except what is noted above in HPI  Physical Exam: BP (!) 175/77    Pulse 73   Constitutional:  Alert and oriented, No acute distress. HEENT: [place] AT, moist mucus membranes.  Trachea midline, no masses. Cardiovascular: No clubbing, cyanosis, or edema. Respiratory: Normal respiratory effort, no increased work of breathing. GI: Abdomen is soft, nontender, nondistended, no abdominal masses GU: No CVA tenderness.  Lymph: No cervical or inguinal lymphadenopathy. Skin: No rashes, bruises or suspicious lesions. Neurologic: Grossly intact, no focal deficits, moving all 4 extremities. Psychiatric: Normal mood and affect.  Laboratory Data: Lab Results  Component Value Date   WBC 7.1 11/28/2020   HGB 11.3 (L) 11/28/2020   HCT 32.9 (L) 11/28/2020   MCV 90.6 11/28/2020   PLT 450 (H) 11/28/2020    Lab Results  Component Value Date   CREATININE 0.68 11/28/2020    Lab Results  Component Value Date   PSA 3.60 01/06/2009   PSA 3.05 03/25/2007    No results found for: TESTOSTERONE  No results found for:  HGBA1C  Urinalysis    Component Value Date/Time   COLORURINE STRAW (A) 11/21/2020 1230   APPEARANCEUR Clear 07/10/2021 1412   LABSPEC 1.005 11/21/2020 1230   PHURINE 7.0 11/21/2020 1230   GLUCOSEU Negative 07/10/2021 1412   HGBUR NEGATIVE 11/21/2020 1230   HGBUR negative 10/27/2008 0838   BILIRUBINUR Negative 07/10/2021 1412   KETONESUR NEGATIVE 11/21/2020 1230   PROTEINUR 1+ (A) 07/10/2021 1412   PROTEINUR NEGATIVE 11/21/2020 1230   UROBILINOGEN 0.2 01/11/2014 1405   NITRITE Negative 07/10/2021 1412   NITRITE NEGATIVE 11/21/2020 1230   LEUKOCYTESUR 1+ (A) 07/10/2021 1412   LEUKOCYTESUR NEGATIVE 11/21/2020 1230    Lab Results  Component Value Date   LABMICR See below: 07/10/2021   WBCUA 11-30 (A) 07/10/2021   LABEPIT 0-10 07/10/2021   MUCUS Present 07/10/2021   BACTERIA None seen 07/10/2021    Pertinent Imaging:  No results found for this or any previous visit.  No results found for this or any previous visit.  No results found for this or any previous visit.  No results found for this or any previous visit.  No results found for this or any previous visit.  No results found for this or any previous visit.  No results found for this or any previous visit.  No results found for this or any previous visit.   Assessment & Plan:    1. Recurrent UTI -continue macrobid 50mg  qhs - BLADDER SCAN AMB NON-IMAGING - Urinalysis, Routine w reflex microscopic  2. Benign prostatic hyperplasia with urinary obstruction -Continue uroxatral 10mg  daily  3. Weak urinary stream -Continue uroxatral 10mg  daily  4. Urinary frequency -We discussed PTNS, intravesical botox, and interstim. After discussing the options the patient elects for PTNS   No follow-ups on file.  Nicolette Bang, MD  [facility] [place]

## 2021-10-11 NOTE — Progress Notes (Signed)
Urological Symptom Review  Patient is experiencing the following symptoms: Frequent urination Hard to postpone urination Get up at night to urinate Urinary tract infection Erection problems (male only)   Review of Systems  Gastrointestinal (upper)  : Negative for upper GI symptoms  Gastrointestinal (lower) : Negative for lower GI symptoms  Constitutional : Negative for symptoms  Skin: Negative for skin symptoms  Eyes: Negative for eye symptoms  Ear/Nose/Throat : Negative for Ear/Nose/Throat symptoms  Hematologic/Lymphatic: Negative for Hematologic/Lymphatic symptoms  Cardiovascular : Leg swelling  Respiratory : Negative for respiratory symptoms  Endocrine: Negative for endocrine symptoms  Musculoskeletal: Negative for musculoskeletal symptoms  Neurological: Negative for neurological symptoms  Psychologic: Negative for psychiatric symptoms

## 2021-10-11 NOTE — Patient Instructions (Signed)

## 2021-10-26 DIAGNOSIS — E7849 Other hyperlipidemia: Secondary | ICD-10-CM | POA: Diagnosis not present

## 2021-10-26 DIAGNOSIS — E782 Mixed hyperlipidemia: Secondary | ICD-10-CM | POA: Diagnosis not present

## 2021-10-26 DIAGNOSIS — R7301 Impaired fasting glucose: Secondary | ICD-10-CM | POA: Diagnosis not present

## 2021-10-26 DIAGNOSIS — I10 Essential (primary) hypertension: Secondary | ICD-10-CM | POA: Diagnosis not present

## 2021-10-26 DIAGNOSIS — E871 Hypo-osmolality and hyponatremia: Secondary | ICD-10-CM | POA: Diagnosis not present

## 2021-11-01 ENCOUNTER — Ambulatory Visit (INDEPENDENT_AMBULATORY_CARE_PROVIDER_SITE_OTHER): Payer: Medicare Other

## 2021-11-01 ENCOUNTER — Other Ambulatory Visit: Payer: Self-pay

## 2021-11-01 DIAGNOSIS — N3281 Overactive bladder: Secondary | ICD-10-CM

## 2021-11-01 NOTE — Progress Notes (Signed)
PTNS  Session # 1 of 12 (45 total)  Health & Social Factors: takes fluid pills.  Caffeine: none Alcohol: none Daytime voids #per day: 10 Night-time voids #per night: 6 Urgency: yes Incontinence Episodes #per day: none Ankle used: left Treatment Setting: 11 Feeling/ Response: tingling strong Comments: lower extremity swelling noted.   Performed By: Estill Bamberg RN  Follow Up: 1 week PTNS

## 2021-11-02 DIAGNOSIS — E7849 Other hyperlipidemia: Secondary | ICD-10-CM | POA: Diagnosis not present

## 2021-11-02 DIAGNOSIS — R2681 Unsteadiness on feet: Secondary | ICD-10-CM | POA: Diagnosis not present

## 2021-11-02 DIAGNOSIS — E871 Hypo-osmolality and hyponatremia: Secondary | ICD-10-CM | POA: Diagnosis not present

## 2021-11-02 DIAGNOSIS — I10 Essential (primary) hypertension: Secondary | ICD-10-CM | POA: Diagnosis not present

## 2021-11-02 DIAGNOSIS — Z6838 Body mass index (BMI) 38.0-38.9, adult: Secondary | ICD-10-CM | POA: Diagnosis not present

## 2021-11-02 DIAGNOSIS — I4819 Other persistent atrial fibrillation: Secondary | ICD-10-CM | POA: Diagnosis not present

## 2021-11-02 DIAGNOSIS — J4520 Mild intermittent asthma, uncomplicated: Secondary | ICD-10-CM | POA: Diagnosis not present

## 2021-11-02 DIAGNOSIS — M21371 Foot drop, right foot: Secondary | ICD-10-CM | POA: Diagnosis not present

## 2021-11-08 ENCOUNTER — Other Ambulatory Visit: Payer: Self-pay

## 2021-11-08 ENCOUNTER — Ambulatory Visit (INDEPENDENT_AMBULATORY_CARE_PROVIDER_SITE_OTHER): Payer: Medicare Other

## 2021-11-08 DIAGNOSIS — N3281 Overactive bladder: Secondary | ICD-10-CM | POA: Diagnosis not present

## 2021-11-08 NOTE — Progress Notes (Signed)
PTNS  Session # 2 of 12  Health & Social Factors: takes fluid pill Caffeine: 16 oz Alcohol: none Daytime voids #per day: 12 Night-time voids #per night: 6 Urgency: yes Incontinence Episodes #per day: none Ankle used: left Treatment Setting: 3 Feeling/ Response: tingling Comments: none  Performed By: Hope Cobb  Follow Up: 1 week PTNS

## 2021-11-15 ENCOUNTER — Other Ambulatory Visit: Payer: Self-pay

## 2021-11-15 ENCOUNTER — Ambulatory Visit (INDEPENDENT_AMBULATORY_CARE_PROVIDER_SITE_OTHER): Payer: Medicare Other

## 2021-11-15 DIAGNOSIS — N3281 Overactive bladder: Secondary | ICD-10-CM

## 2021-11-15 NOTE — Progress Notes (Signed)
PTNS  Session # 3 of 12 ( 45 total)  Health & Social Factors: takes fluid pills Caffeine: 16oz Alcohol: none Daytime voids #per day: 6 Night-time voids #per night: 6 Urgency: yes Incontinence Episodes #per day: none Ankle used: left Treatment Setting: 4 Feeling/ Response: positive sensation of tingling sensation- adjusted to 5 unable to tolerate- 4 resumed Comments:   Performed By: Estill Bamberg RN  Follow Up: weekly PTNS

## 2021-11-21 DIAGNOSIS — I739 Peripheral vascular disease, unspecified: Secondary | ICD-10-CM | POA: Diagnosis not present

## 2021-11-21 DIAGNOSIS — M79675 Pain in left toe(s): Secondary | ICD-10-CM | POA: Diagnosis not present

## 2021-11-21 DIAGNOSIS — L110 Acquired keratosis follicularis: Secondary | ICD-10-CM | POA: Diagnosis not present

## 2021-11-21 DIAGNOSIS — M79674 Pain in right toe(s): Secondary | ICD-10-CM | POA: Diagnosis not present

## 2021-11-21 DIAGNOSIS — M79671 Pain in right foot: Secondary | ICD-10-CM | POA: Diagnosis not present

## 2021-11-21 DIAGNOSIS — M79672 Pain in left foot: Secondary | ICD-10-CM | POA: Diagnosis not present

## 2021-11-22 ENCOUNTER — Ambulatory Visit (INDEPENDENT_AMBULATORY_CARE_PROVIDER_SITE_OTHER): Payer: Medicare Other

## 2021-11-22 ENCOUNTER — Other Ambulatory Visit: Payer: Self-pay

## 2021-11-22 DIAGNOSIS — N3281 Overactive bladder: Secondary | ICD-10-CM | POA: Diagnosis not present

## 2021-11-22 NOTE — Progress Notes (Signed)
PTNS  Session # 4 of 12 (total 45)  Health & Social Factors: takes fluid pill Caffeine: 16 oz Alcohol: none Daytime voids #per day: 8 Night-time voids #per night: 8 Urgency: yes Incontinence Episodes #per day: none Ankle used: left Treatment Setting: 4 Feeling/ Response: heel and foot positive sensation Comments:   Performed By: Lurline Hare  Follow Up: weekly PTNS

## 2021-11-28 ENCOUNTER — Ambulatory Visit (INDEPENDENT_AMBULATORY_CARE_PROVIDER_SITE_OTHER): Payer: Medicare Other | Admitting: Cardiology

## 2021-11-28 ENCOUNTER — Encounter: Payer: Self-pay | Admitting: Cardiology

## 2021-11-28 VITALS — BP 132/78 | HR 76 | Ht 69.5 in | Wt 248.0 lb

## 2021-11-28 DIAGNOSIS — I2510 Atherosclerotic heart disease of native coronary artery without angina pectoris: Secondary | ICD-10-CM | POA: Diagnosis not present

## 2021-11-28 DIAGNOSIS — I350 Nonrheumatic aortic (valve) stenosis: Secondary | ICD-10-CM | POA: Diagnosis not present

## 2021-11-28 DIAGNOSIS — I4819 Other persistent atrial fibrillation: Secondary | ICD-10-CM | POA: Diagnosis not present

## 2021-11-28 DIAGNOSIS — I2584 Coronary atherosclerosis due to calcified coronary lesion: Secondary | ICD-10-CM

## 2021-11-28 NOTE — Progress Notes (Signed)
Cardiology Office Note  Date: 11/28/2021   ID: Julian Fowler, DOB Feb 19, 1935, MRN [id number]  PCP:  [name], MD  Cardiologist:  Rozann Lesches, MD Electrophysiologist:  None   Chief Complaint  Patient presents with   Cardiac follow-up    History of Present Illness: Julian Fowler is an 87 y.o. male last seen in August 2022 by Mr. Leonides Sake NP.  He is here today with his wife for a follow-up visit.  He does not report any significant palpitations.  Still voices trouble with his balance, uses a walking stick, does not report any falls in the last 6 months.  He has considered going back to exercise by walking in the pool at the [facility].  I reviewed his medications which are noted below.  No significant change from a cardiac perspective.  He is not anticoagulated as discussed in previous notes.  Echocardiogram from February of last year revealed LVEF 60 to 65%, moderately calcified aortic valve with mild stenosis and mean gradient 10 mmHg.  Past Medical History:  Diagnosis Date   Allergic rhinitis    Aortic stenosis    Arthritis    Atrial fibrillation (HCC)    BPH (benign prostatic hyperplasia)    Coronary artery calcification seen on CT scan    Easy bruisability    ED (erectile dysfunction)    Essential hypertension    GERD (gastroesophageal reflux disease)    H/O hiatal hernia    Hearing loss in left ear    History of asbestosis    History of shingles    Hyperlipidemia    Overactive bladder    PSVT (paroxysmal supraventricular tachycardia) (HCC)    Sleep apnea    No longer on CPAP following weight loss   Tinnitus     Past Surgical History:  Procedure Laterality Date   BACK SURGERY  2014   herniated L1, L2   Dr Carloyn Manner   BRAIN SURGERY     CEREBRAL EMBOLIZATION  12/2011   "radiation therapy-did not work"   CHOLECYSTECTOMY  6/98   COLONOSCOPY  09/2009   Dr. Lindalou Hose: normal, internal hemorrhoids    COLONOSCOPY N/A 02/28/2017   Dr. Gala Romney: Hemorrhoids, grade 3,  mild diverticulosis.   CRANIECTOMY FOR EXCISION OF ACOUSTIC NEUROMA  3/95   MINOR AMPUTATION OF DIGIT Left 12/31/2018   Procedure: REVISION AMPUTATION OF LEFT INDEX FINGER, IRRIGATION AND DEBRIDEMENT LEFT INDEX FINGER;  Surgeon: Verner Mould, MD;  Location: [facility];  Service: Orthopedics;  Laterality: Left;   RADIOLOGY WITH ANESTHESIA N/A 07/07/2014   Procedure: EMBOLIZATION;  Surgeon: Rob Hickman, MD;  Location: [facility];  Service: Radiology;  Laterality: N/A;   TOTAL KNEE ARTHROPLASTY Left 07/06/2013   Procedure: LEFT TOTAL KNEE ARTHROPLASTY;  Surgeon: Gearlean Alf, MD;  Location: [facility];  Service: Orthopedics;  Laterality: Left;   TOTAL KNEE ARTHROPLASTY Right 01/18/2014   Procedure: RIGHT TOTAL KNEE ARTHROPLASTY;  Surgeon: Gearlean Alf, MD;  Location[facility];  Service: Orthopedics;  Laterality: Right;   TRIGGER FINGER RELEASE  2003   (thumb) middle finger (2006)    Current Outpatient Medications  Medication Sig Dispense Refill   acetaminophen (TYLENOL) 325 MG tablet Take 325 mg by mouth as needed.     alfuzosin (UROXATRAL) 10 MG 24 hr tablet Take 1 tablet (10 mg total) by mouth at bedtime. 30 tablet 11   azelastine (ASTELIN) 0.1 % nasal spray 1-2 puffs each nostril every 8 hours if needed for drainage (  Patient taking differently: as needed. 1-2 puffs each nostril every 8 hours if needed for drainage) 30 mL 12   Calcium Carb-Vit D-C-E-Mineral (OS-CAL ULTRA) 600 MG TABS Take 1 tablet by mouth daily.      diclofenac Sodium (VOLTAREN) 1 % GEL Apply 2 g topically daily as needed (For pain).     esomeprazole (NEXIUM) 40 MG capsule TAKE 1 CAPSULE BY MOUTH TWICE DAILY BEFORE A MEAL 60 capsule 5   fexofenadine (ALLEGRA) 180 MG tablet Take 180 mg by mouth daily as needed for allergies.     fluticasone (FLONASE) 50 MCG/ACT nasal spray Place 2 sprays into both nostrils daily. (Patient taking differently: Place 2 sprays into both nostrils daily as needed for allergies.) 18.2 g 12    furosemide (LASIX) 20 MG tablet Take 1 tablet (20 mg total) by mouth daily. 30 tablet 3   hydrALAZINE (APRESOLINE) 50 MG tablet Take 1.5 tablets (75 mg total) by mouth 3 (three) times daily. 410 tablet 3   latanoprost (XALATAN) 0.005 % ophthalmic solution Place 1 drop into both eyes at bedtime.      LORazepam (ATIVAN) 1 MG tablet TAKE 1 TABLET BY MOUTH THREE TIMES DAILY AS NEEDED FOR ANXIETY 90 tablet 5   losartan (COZAAR) 100 MG tablet Take 100 mg by mouth every morning.      metoprolol succinate (TOPROL-XL) 25 MG 24 hr tablet Take 25 mg by mouth daily. 1/2 tablet daily     Multiple Vitamins-Minerals (CENTRUM SILVER PO) Take 1 tablet by mouth daily.      nitrofurantoin (MACRODANTIN) 50 MG capsule Take 1 capsule (50 mg total) by mouth at bedtime. 30 capsule 11   Polyethyl Glycol-Propyl Glycol (SYSTANE OP) Place 1 drop into both eyes daily.     polyethylene glycol (MIRALAX / GLYCOLAX) packet Take 17 g by mouth at bedtime.     potassium chloride SA (KLOR-CON) 20 MEQ tablet Take 1 tablet by mouth twice daily 180 tablet 1   pravastatin (PRAVACHOL) 40 MG tablet Take 1 tablet by mouth at bedtime.      PROAIR HFA 108 (90 Base) MCG/ACT inhaler INHALE 2 PUFFS BY MOUTH EVERY 6 HOURS AS NEEDED FOR WHEEZING FOR SHORTNESS OF BREATH (Patient taking differently: Inhale 1 puff into the lungs every 6 (six) hours as needed.) 18 g 12   sodium chloride 1 g tablet Take 1 g by mouth 3 (three) times daily.     SYMBICORT 160-4.5 MCG/ACT inhaler INHALE 2 PUFFS BY MOUTH  TWICE DAILY THEN  RINSE  MOUTH 11 g 6   Wheat Dextrin (BENEFIBER) POWD Take 1 packet by mouth 2 (two) times daily.     Zinc 25 MG TABS Take 1 tablet by mouth daily.     zolpidem (AMBIEN) 10 MG tablet Take 1 tablet by mouth at bedtime.      No current facility-administered medications for this visit.   Allergies:  Bactrim [sulfamethoxazole-trimethoprim], Prednisone, Codeine, and Tape   ROS: Hearing loss.  No syncope.  Physical Exam: VS:  BP 132/78     Pulse 76    Ht 5' 9.5" (1.765 m)    Wt 248 lb (112.5 kg)    SpO2 97%    BMI 36.10 kg/m , BMI Body mass index is 36.1 kg/m.  Wt Readings from Last 3 Encounters:  11/28/21 248 lb (112.5 kg)  10/03/21 243 lb 6.4 oz (110.4 kg)  08/03/21 245 lb 6.4 oz (111.3 kg)    General: Patient appears comfortable at rest. HEENT: Conjunctiva  and lids normal, wearing a mask. Neck: Supple, no elevated JVP or carotid bruits, no thyromegaly. Lungs: Clear to auscultation, nonlabored breathing at rest. Cardiac: Irregularly irregular, no S3, 1/6 systolic murmur. Extremities: Mild lower leg edema.  ECG:  An ECG dated 02/01/2021 was personally reviewed today and demonstrated:  Atrial fibrillation.  Recent Labwork:  April 2022: Hemoglobin 12.8, platelets 288, BUN 12, creatinine 0.92, potassium 4.2, AST 25, ALT 21,  Other Studies Reviewed Today:  Renal artery Dopplers 09/21/2021: Summary:  Renal:     Right: Normal size right kidney. Normal cortical thickness of right         kidney. No evidence of right renal artery stenosis. RRV flow         present.  Left:  Normal size of left kidney. Normal cortical thickness of the         left kidney. No evidence of left renal artery stenosis. LRV         flow present.  Mesenteric:     Turbulent flow in the SMA suggesting ostial stenosis.   Assessment and Plan:  1.  Permanent atrial fibrillation, largely asymptomatic in terms of palpitations on current dose of Toprol-XL.  CHA2DS2-VASc score is 4.  He is not anticoagulated based on previous discussions.  2.  Mild calcific aortic stenosis.  No significant change in examination.  He remains asymptomatic.  3.  Coronary artery calcification by CT imaging.  He does not describe any obvious angina at current level of activity and remains on statin therapy.  Medication Adjustments/Labs and Tests Ordered: Current medicines are reviewed at length with the patient today.  Concerns regarding medicines are outlined above.    Tests Ordered: No orders of the defined types were placed in this encounter.   Medication Changes: No orders of the defined types were placed in this encounter.   Disposition:  Follow up  6 months.  Signed, Satira Sark, MD, [facility] 11/28/2021 2:52 PM    [facility] [place], [place], [place] [address] Phone: [phone]; Fax: [phone]

## 2021-11-28 NOTE — Patient Instructions (Signed)

## 2021-11-29 ENCOUNTER — Other Ambulatory Visit: Payer: Self-pay

## 2021-11-29 ENCOUNTER — Ambulatory Visit (INDEPENDENT_AMBULATORY_CARE_PROVIDER_SITE_OTHER): Payer: Medicare Other | Admitting: Physician Assistant

## 2021-11-29 DIAGNOSIS — I2510 Atherosclerotic heart disease of native coronary artery without angina pectoris: Secondary | ICD-10-CM

## 2021-11-29 DIAGNOSIS — N3281 Overactive bladder: Secondary | ICD-10-CM | POA: Diagnosis not present

## 2021-11-29 DIAGNOSIS — I2584 Coronary atherosclerosis due to calcified coronary lesion: Secondary | ICD-10-CM | POA: Diagnosis not present

## 2021-11-29 DIAGNOSIS — N401 Enlarged prostate with lower urinary tract symptoms: Secondary | ICD-10-CM

## 2021-11-29 DIAGNOSIS — N138 Other obstructive and reflux uropathy: Secondary | ICD-10-CM | POA: Diagnosis not present

## 2021-11-29 NOTE — Progress Notes (Signed)
Assessment: 1. Benign prostatic hyperplasia with urinary obstruction   2. Overactive bladder     Plan: The patient is reassured that there is no evidence of urinary infectious process at this time.  Following a lengthy discussion of more than 20 minutes concerning the patient's medical history, urinary complaints, treatment options, he conveys understanding that his medications will cause an increase in urinary frequency because of fluid retention and agrees to continue PTNS for the entire course of treatment for his symptoms of overactive bladder.  Following visit, the patient agrees to return for all of his scheduled treatment visits.  Chief Complaint: No chief complaint on file. Urinary frequency  HPI: Julian Fowler is a 87 y.o. male who presents for continued evaluation of OAB and BPH with urinary obstruction, and scheduled for PTNS. The pt reports concerns that the procedure is not helping and that he is having an increase in frequency and urgency since starting.  He questions whether PTNS is an effective treatment choice and requested to speak to provider.  Upon further questioning, the patient admits that he recently had a change in his diuretic prescriptions and admits that his frequency increases significantly after taking his Lasix.  He continues to have lower extremity edema.  He denies shortness of breath or orthopnea at this time.  He denies other urinary symptoms of dysuria, burning, gross hematuria, incomplete emptying.  He conveys significant concerns over recent urologic history of urosepsis and renal injury.   Portions of the above documentation were copied from a prior visit for review purposes only.  Allergies: Allergies  Allergen Reactions   Bactrim [Sulfamethoxazole-Trimethoprim]    Prednisone     Pt is high functioning and out of sorts.   Codeine Other (See Comments)    REACTION: groggy   Tape Other (See Comments)    Skin tears, use paper tape    PMH: Past  Medical History:  Diagnosis Date   Allergic rhinitis    Aortic stenosis    Arthritis    Atrial fibrillation (HCC)    BPH (benign prostatic hyperplasia)    Coronary artery calcification seen on CT scan    Easy bruisability    ED (erectile dysfunction)    Essential hypertension    GERD (gastroesophageal reflux disease)    H/O hiatal hernia    Hearing loss in left ear    History of asbestosis    History of shingles    Hyperlipidemia    Overactive bladder    PSVT (paroxysmal supraventricular tachycardia) (HCC)    Sleep apnea    No longer on CPAP following weight loss   Tinnitus     PSH: Past Surgical History:  Procedure Laterality Date   BACK SURGERY  2014   herniated L1, L2   Dr Carloyn Manner   BRAIN SURGERY     CEREBRAL EMBOLIZATION  12/2011   "radiation therapy-did not work"   CHOLECYSTECTOMY  6/98   COLONOSCOPY  09/2009   Dr. Lindalou Hose: normal, internal hemorrhoids    COLONOSCOPY N/A 02/28/2017   Dr. Gala Romney: Hemorrhoids, grade 3, mild diverticulosis.   CRANIECTOMY FOR EXCISION OF ACOUSTIC NEUROMA  3/95   MINOR AMPUTATION OF DIGIT Left 12/31/2018   Procedure: REVISION AMPUTATION OF LEFT INDEX FINGER, IRRIGATION AND DEBRIDEMENT LEFT INDEX FINGER;  Surgeon: Verner Mould, MD;  Location: [facility];  Service: Orthopedics;  Laterality: Left;   RADIOLOGY WITH ANESTHESIA N/A 07/07/2014   Procedure: EMBOLIZATION;  Surgeon: Rob Hickman, MD;  Location: [facility];  Service: Radiology;  Laterality: N/A;   TOTAL KNEE ARTHROPLASTY Left 07/06/2013   Procedure: LEFT TOTAL KNEE ARTHROPLASTY;  Surgeon: Gearlean Alf, MD;  Location: [facility];  Service: Orthopedics;  Laterality: Left;   TOTAL KNEE ARTHROPLASTY Right 01/18/2014   Procedure: RIGHT TOTAL KNEE ARTHROPLASTY;  Surgeon: Gearlean Alf, MD;  Location: [facility];  Service: Orthopedics;  Laterality: Right;   TRIGGER FINGER RELEASE  2003   (thumb) middle finger (2006)    SH: Social History   Tobacco Use   Smoking status: Former     Packs/day: 1.50    Years: 30.00    Pack years: 45.00    Types: Cigarettes    Start date: 04/24/1957    Quit date: 10/15/1986    Years since quitting: 35.1   Smokeless tobacco: Never  Vaping Use   Vaping Use: Never used  Substance Use Topics   Alcohol use: No    Alcohol/week: 0.0 standard drinks    Comment: Used to drink heavily at times   Drug use: No    ROS: Constitutional:  Negative for fever, chills, weight loss CV: Negative for chest pain, previous MI, hypertension Respiratory:  Negative for shortness of breath, wheezing, sleep apnea, frequent cough GI:  Negative for nausea, vomiting, bloody stool, GERD  PE: There were no vitals taken for this visit. GENERAL APPEARANCE:  Well appearing, well developed, well nourished, NAD HEENT:  Atraumatic, normocephalic NECK:  Supple. Trachea midline ABDOMEN:  Soft, non-tender, no masses EXTREMITIES:  Moves all extremities well, without clubbing, cyanosis, or edema NEUROLOGIC:  Alert and oriented x 3, normal gait, CN II-XII grossly intact MENTAL STATUS:  appropriate BACK:  Non-tender to palpation, No CVAT SKIN:  Warm, dry, and intact   Results: Laboratory Data: Lab Results  Component Value Date   WBC 7.1 11/28/2020   HGB 11.3 (L) 11/28/2020   HCT 32.9 (L) 11/28/2020   MCV 90.6 11/28/2020   PLT 450 (H) 11/28/2020    Lab Results  Component Value Date   CREATININE 0.68 11/28/2020    Lab Results  Component Value Date   PSA 3.60 01/06/2009   PSA 3.05 03/25/2007    No results found for: TESTOSTERONE  No results found for: HGBA1C  Urinalysis    Component Value Date/Time   COLORURINE STRAW (A) 11/21/2020 1230   APPEARANCEUR Clear 10/11/2021 1318   LABSPEC 1.005 11/21/2020 1230   PHURINE 7.0 11/21/2020 1230   GLUCOSEU Negative 10/11/2021 1318   HGBUR NEGATIVE 11/21/2020 1230   HGBUR negative 10/27/2008 0838   BILIRUBINUR Negative 10/11/2021 1318   KETONESUR NEGATIVE 11/21/2020 1230   PROTEINUR 1+ (A) 10/11/2021 1318    PROTEINUR NEGATIVE 11/21/2020 1230   UROBILINOGEN 0.2 01/11/2014 1405   NITRITE Negative 10/11/2021 1318   NITRITE NEGATIVE 11/21/2020 1230   LEUKOCYTESUR Trace (A) 10/11/2021 1318   LEUKOCYTESUR NEGATIVE 11/21/2020 1230    Lab Results  Component Value Date   LABMICR See below: 10/11/2021   WBCUA 6-10 (A) 10/11/2021   LABEPIT 0-10 10/11/2021   MUCUS Present 10/11/2021   BACTERIA None seen 10/11/2021    Pertinent Imaging:  No results found for this or any previous visit.  No results found for this or any previous visit.  No results found for this or any previous visit.  No results found for this or any previous visit.  No results found for this or any previous visit.  No results found for this or any previous visit.  No results found for this or any  previous visit.  No results found for this or any previous visit.  No results found for this or any previous visit (from the past 24 hour(s)).

## 2021-11-29 NOTE — Progress Notes (Signed)
PTNS  Session # 5 of 12  Health & Social Factors: Takes fluid pills Caffeine: 16oz Alcohol: 0 Daytime voids #per day: 12 Night-time voids #per night: 6 Urgency: yes Incontinence Episodes #per day: 0 Ankle used: left Treatment Setting: 4 Feeling/ Response: tingling sensation in foot Comments: None  Performed By: Janett Billow CMA  Follow Up: next ptns treatment

## 2021-12-06 ENCOUNTER — Ambulatory Visit (INDEPENDENT_AMBULATORY_CARE_PROVIDER_SITE_OTHER): Payer: Medicare Other

## 2021-12-06 ENCOUNTER — Other Ambulatory Visit: Payer: Self-pay

## 2021-12-06 DIAGNOSIS — N3281 Overactive bladder: Secondary | ICD-10-CM | POA: Diagnosis not present

## 2021-12-06 NOTE — Progress Notes (Signed)
PTNS  Session # 6 of 12  Health & Social Factors: takes fluid pill Caffeine: 16oz Alcohol: none Daytime voids #per day: 10 Night-time voids #per night: 8 Urgency: yes Incontinence Episodes #per day: none Ankle used: left Treatment Setting: 19 Feeling/ Response: foot tingling Comments:   Performed By: Barnetta Chapel LPN  Follow Up: 1 week PTNS

## 2021-12-07 DIAGNOSIS — H02821 Cysts of right upper eyelid: Secondary | ICD-10-CM | POA: Diagnosis not present

## 2021-12-09 ENCOUNTER — Other Ambulatory Visit: Payer: Self-pay | Admitting: Cardiology

## 2021-12-13 ENCOUNTER — Other Ambulatory Visit: Payer: Self-pay

## 2021-12-13 NOTE — Progress Notes (Cosign Needed Addendum)
Attempted PTNS today- patient was unable to feel any stimulation despite multiple attempts by myself and Hope, LPN. ? ?Patient would like to come back tomorrow to attempt PTNS again.  ?

## 2021-12-14 ENCOUNTER — Ambulatory Visit (INDEPENDENT_AMBULATORY_CARE_PROVIDER_SITE_OTHER): Payer: Medicare Other

## 2021-12-14 ENCOUNTER — Other Ambulatory Visit: Payer: Self-pay

## 2021-12-14 DIAGNOSIS — N3281 Overactive bladder: Secondary | ICD-10-CM | POA: Diagnosis not present

## 2021-12-14 NOTE — Progress Notes (Signed)
PTNS ? ?Session # 7 of 12 ? ?Health & Social Factors: none ?Caffeine: 16 oz ?Alcohol: none ?Daytime voids #per day: 8 ?Night-time voids #per night: 8 ?Urgency: yes ?Incontinence Episodes #per day: no ?Ankle used: left ?Treatment Setting: 4 ?Feeling/ Response: positive sensation ?Comments: n/a ? ?Performed By: [facility] LPN ? ?Follow Up: Keep scheduled NV ?

## 2021-12-20 ENCOUNTER — Ambulatory Visit (INDEPENDENT_AMBULATORY_CARE_PROVIDER_SITE_OTHER): Payer: Medicare Other

## 2021-12-20 ENCOUNTER — Other Ambulatory Visit: Payer: Self-pay

## 2021-12-20 DIAGNOSIS — N3281 Overactive bladder: Secondary | ICD-10-CM | POA: Diagnosis not present

## 2021-12-20 NOTE — Progress Notes (Signed)
PTNS ? ?Session # 8 of 12 ? ?Health & Social Factors: fluid pills ?Caffeine: 16 oz ?Alcohol: none ?Daytime voids #per day: 16 ?Night-time voids #per night: 5 ?Urgency: yes ?Incontinence Episodes #per day: none ?Ankle used: left ?Treatment Setting: none ?Feeling/ Response: positive ?Comments: n/a ? ?Performed By: [facility] LPN ? ?Follow Up: keep scheduled NV ?

## 2021-12-23 ENCOUNTER — Other Ambulatory Visit: Payer: Self-pay | Admitting: Cardiology

## 2021-12-26 ENCOUNTER — Ambulatory Visit: Payer: Medicare Other | Admitting: Internal Medicine

## 2021-12-27 ENCOUNTER — Ambulatory Visit (INDEPENDENT_AMBULATORY_CARE_PROVIDER_SITE_OTHER): Payer: Medicare Other

## 2021-12-27 ENCOUNTER — Other Ambulatory Visit: Payer: Self-pay

## 2021-12-27 DIAGNOSIS — N3281 Overactive bladder: Secondary | ICD-10-CM

## 2021-12-27 NOTE — Progress Notes (Signed)
PTNS ? ?Session # 9 of 12 ? ?Health & Social Factors: takes fluid pills ?Caffeine: 16oz ?Alcohol: none ?Daytime voids #per day: ( if takes fluid pill 22 times. If hold fluid pill 5) ?Night-time voids #per night: 4 ?Urgency: yes ?Incontinence Episodes #per day: none ?Ankle used: left ?Treatment Setting: 7 ?Feeling/ Response: positive strong sensation  ?Comments: none ? ?Performed By: Estill Bamberg RN ? ?Follow Up: 1 week PTNS ? ?

## 2022-01-03 ENCOUNTER — Other Ambulatory Visit: Payer: Self-pay

## 2022-01-03 ENCOUNTER — Ambulatory Visit (INDEPENDENT_AMBULATORY_CARE_PROVIDER_SITE_OTHER): Payer: Medicare Other

## 2022-01-03 DIAGNOSIS — N3281 Overactive bladder: Secondary | ICD-10-CM | POA: Diagnosis not present

## 2022-01-03 NOTE — Progress Notes (Signed)
PTNS ? ?Session # 10 of 12 ? ?Health & Social Factors: Taking fluid pills ?Caffeine: 16 oz ?Alcohol: none ?Daytime voids #per day: 22 ?Night-time voids #per night: 4 ?Urgency: yes ?Incontinence Episodes #per day: none ?Ankle used: left ?Treatment Setting: 4 ?Feeling/ Response: tingling ?Comments: n/a ? ?Performed By: [place]. LPN ? ?Follow Up: keep next scheduled apt.  ?

## 2022-01-05 NOTE — Addendum Note (Signed)
Addended by: Audie Box on: 01/05/2022 07:56 AM ? ? Modules accepted: Level of Service ? ?

## 2022-01-10 ENCOUNTER — Ambulatory Visit: Payer: Medicare Other

## 2022-01-17 ENCOUNTER — Ambulatory Visit (INDEPENDENT_AMBULATORY_CARE_PROVIDER_SITE_OTHER): Payer: Medicare Other | Admitting: Urology

## 2022-01-17 DIAGNOSIS — I2584 Coronary atherosclerosis due to calcified coronary lesion: Secondary | ICD-10-CM

## 2022-01-17 DIAGNOSIS — I2510 Atherosclerotic heart disease of native coronary artery without angina pectoris: Secondary | ICD-10-CM | POA: Diagnosis not present

## 2022-01-17 DIAGNOSIS — N3281 Overactive bladder: Secondary | ICD-10-CM | POA: Diagnosis not present

## 2022-01-17 NOTE — Progress Notes (Signed)
PTNS ? ?Session # 11 of 12 ? ?Health & Social Factors: takes fluid pills ?Caffeine: 16oz ?Alcohol: none ?Daytime voids #per day: 20 ( if takes lasix) ?Night-time voids #per night: 4 ?Urgency: yes ?Incontinence Episodes #per day: none ?Ankle used: left ?Treatment Setting: 3 ?Feeling/ Response: positive heel/foot sensation ?Comments: none ? ?Performed By: Barnetta Chapel LPN ? ?Follow Up: weekly PTNS  ?

## 2022-01-24 ENCOUNTER — Ambulatory Visit (INDEPENDENT_AMBULATORY_CARE_PROVIDER_SITE_OTHER): Payer: Medicare Other | Admitting: Physician Assistant

## 2022-01-24 DIAGNOSIS — N3281 Overactive bladder: Secondary | ICD-10-CM

## 2022-01-24 NOTE — Progress Notes (Signed)
PTNS ? ?Session # 12 of 12 ? ?Health & Social Factors: taking fluid pills ?Caffeine: 16oz ?Alcohol: none ?Daytime voids #per day: 20 ?Night-time voids #per night: 4 ?Urgency: yes ?Incontinence Episodes #per day: none ?Ankle used: left ?Treatment Setting: 5 ?Feeling/ Response: feel it in the toes ?Comments: tolerable  ? ?Performed By: Durenda Guthrie, LPN ? ?Follow Up: Follow up with MD  ?

## 2022-01-29 ENCOUNTER — Ambulatory Visit (INDEPENDENT_AMBULATORY_CARE_PROVIDER_SITE_OTHER): Payer: Medicare Other | Admitting: Urology

## 2022-01-29 ENCOUNTER — Encounter: Payer: Self-pay | Admitting: Urology

## 2022-01-29 VITALS — BP 199/97 | HR 100

## 2022-01-29 DIAGNOSIS — Z131 Encounter for screening for diabetes mellitus: Secondary | ICD-10-CM | POA: Diagnosis not present

## 2022-01-29 DIAGNOSIS — I10 Essential (primary) hypertension: Secondary | ICD-10-CM | POA: Diagnosis not present

## 2022-01-29 DIAGNOSIS — N3281 Overactive bladder: Secondary | ICD-10-CM | POA: Diagnosis not present

## 2022-01-29 DIAGNOSIS — Z13228 Encounter for screening for other metabolic disorders: Secondary | ICD-10-CM | POA: Diagnosis not present

## 2022-01-29 DIAGNOSIS — I2510 Atherosclerotic heart disease of native coronary artery without angina pectoris: Secondary | ICD-10-CM

## 2022-01-29 DIAGNOSIS — S0990XA Unspecified injury of head, initial encounter: Secondary | ICD-10-CM | POA: Diagnosis not present

## 2022-01-29 DIAGNOSIS — R2681 Unsteadiness on feet: Secondary | ICD-10-CM | POA: Diagnosis not present

## 2022-01-29 DIAGNOSIS — R972 Elevated prostate specific antigen [PSA]: Secondary | ICD-10-CM | POA: Diagnosis not present

## 2022-01-29 DIAGNOSIS — W19XXXA Unspecified fall, initial encounter: Secondary | ICD-10-CM | POA: Diagnosis not present

## 2022-01-29 DIAGNOSIS — E871 Hypo-osmolality and hyponatremia: Secondary | ICD-10-CM | POA: Diagnosis not present

## 2022-01-29 DIAGNOSIS — I2584 Coronary atherosclerosis due to calcified coronary lesion: Secondary | ICD-10-CM

## 2022-01-29 DIAGNOSIS — Z6837 Body mass index (BMI) 37.0-37.9, adult: Secondary | ICD-10-CM | POA: Diagnosis not present

## 2022-01-29 DIAGNOSIS — R7309 Other abnormal glucose: Secondary | ICD-10-CM | POA: Diagnosis not present

## 2022-01-29 DIAGNOSIS — I4819 Other persistent atrial fibrillation: Secondary | ICD-10-CM | POA: Diagnosis not present

## 2022-01-29 DIAGNOSIS — E782 Mixed hyperlipidemia: Secondary | ICD-10-CM | POA: Diagnosis not present

## 2022-01-29 DIAGNOSIS — R519 Headache, unspecified: Secondary | ICD-10-CM | POA: Diagnosis not present

## 2022-01-29 NOTE — Progress Notes (Signed)
? ?01/29/2022 ?3:25 PM  ? ?Julian Fowler ?12-01-1934 ?[id number] ? ?Referring provider: [name], MD ?[address] ?[place],  [place] [address] ? ?Followup urinary frequency ? ? ?HPI: ?Julian Fowler is a 87yo here for followup for OAB. He finished 12 weeks of PTNS and noted no improvement in his urinary urgency, urinary frequency and nocturia. He has urinary frequency every 15-30 minutes during the day and 3-5x per night. He takes '20mg'$  of lasix at 12pm daily. If he forgets to take his lasix the urinary frequency does not improve. He has failed mirabegron and gemtesa.  ? ? ?PMH: ?Past Medical History:  ?Diagnosis Date  ? Allergic rhinitis   ? Aortic stenosis   ? Arthritis   ? Atrial fibrillation ([place])   ? BPH (benign prostatic hyperplasia)   ? Coronary artery calcification seen on CT scan   ? Easy bruisability   ? ED (erectile dysfunction)   ? Essential hypertension   ? GERD (gastroesophageal reflux disease)   ? H/O hiatal hernia   ? Hearing loss in left ear   ? History of asbestosis   ? History of shingles   ? Hyperlipidemia   ? Overactive bladder   ? PSVT (paroxysmal supraventricular tachycardia) ([facility])   ? Sleep apnea   ? No longer on CPAP following weight loss  ? Tinnitus   ? ? ?Surgical History: ?Past Surgical History:  ?Procedure Laterality Date  ? BACK SURGERY  2014  ? herniated L1, L2   Dr Carloyn Manner  ? BRAIN SURGERY    ? CEREBRAL EMBOLIZATION  12/2011  ? "radiation therapy-did not work"  ? CHOLECYSTECTOMY  6/98  ? COLONOSCOPY  09/2009  ? Dr. Lindalou Hose: normal, internal hemorrhoids   ? COLONOSCOPY N/A 02/28/2017  ? Dr. Gala Romney: Hemorrhoids, grade 3, mild diverticulosis.  ? CRANIECTOMY FOR EXCISION OF ACOUSTIC NEUROMA  3/95  ? MINOR AMPUTATION OF DIGIT Left 12/31/2018  ? Procedure: REVISION AMPUTATION OF LEFT INDEX FINGER, IRRIGATION AND DEBRIDEMENT LEFT INDEX FINGER;  Surgeon: Verner Mould, MD;  Location: [facility];  Service: Orthopedics;  Laterality: Left;  ? RADIOLOGY WITH ANESTHESIA N/A 07/07/2014  ? Procedure:  EMBOLIZATION;  Surgeon: Rob Hickman, MD;  Location: [facility];  Service: Radiology;  Laterality: N/A;  ? TOTAL KNEE ARTHROPLASTY Left 07/06/2013  ? Procedure: LEFT TOTAL KNEE ARTHROPLASTY;  Surgeon: Gearlean Alf, MD;  Location: [facility];  Service: Orthopedics;  Laterality: Left;  ? TOTAL KNEE ARTHROPLASTY Right 01/18/2014  ? Procedure: RIGHT TOTAL KNEE ARTHROPLASTY;  Surgeon: Gearlean Alf, MD;  [facility];  Service: Orthopedics;  Laterality: Right;  ? TRIGGER FINGER RELEASE  2003  ? (thumb) middle finger (2006)  ? ? ?Home Medications:  ?Allergies as of 01/29/2022   ? ?   Reactions  ? Bactrim [sulfamethoxazole-trimethoprim]   ? Prednisone   ? Pt is high functioning and out of sorts.  ? Codeine Other (See Comments)  ? REACTION: groggy  ? Tape Other (See Comments)  ? Skin tears, use paper tape  ? ?  ? ?  ?Medication List  ?  ? ?  ? Accurate as of January 29, 2022  3:25 PM. If you have any questions, ask your nurse or doctor.  ?  ?  ? ?  ? ?acetaminophen 325 MG tablet ?Commonly known as: TYLENOL ?Take 325 mg by mouth as needed. ?  ?alfuzosin 10 MG 24 hr tablet ?Commonly known as: UROXATRAL ?Take 1 tablet (10 mg total) by mouth at  bedtime. ?  ?azelastine 0.1 % nasal spray ?Commonly known as: ASTELIN ?1-2 puffs each nostril every 8 hours if needed for drainage ?What changed:  ?when to take this ?reasons to take this ?  ?Benefiber Powd ?Take 1 packet by mouth 2 (two) times daily. ?  ?CENTRUM SILVER PO ?Take 1 tablet by mouth daily. ?  ?diclofenac Sodium 1 % Gel ?Commonly known as: VOLTAREN ?Apply 2 g topically daily as needed (For pain). ?  ?esomeprazole 40 MG capsule ?Commonly known as: [place] ?TAKE 1 CAPSULE BY MOUTH TWICE DAILY BEFORE A MEAL ?  ?fexofenadine 180 MG tablet ?Commonly known as: ALLEGRA ?Take 180 mg by mouth daily as needed for allergies. ?  ?fluticasone 50 MCG/ACT nasal spray ?Commonly known as: FLONASE ?Place 2 sprays into both nostrils daily. ?What changed:  ?when to take this ?reasons to take  this ?  ?furosemide 20 MG tablet ?Commonly known as: LASIX ?Take 1 tablet (20 mg total) by mouth daily. ?  ?hydrALAZINE 50 MG tablet ?Commonly known as: APRESOLINE ?TAKE 1 & 1/2 (ONE & ONE-HALF) TABLETS BY MOUTH THREE TIMES DAILY ?  ?latanoprost 0.005 % ophthalmic solution ?Commonly known as: XALATAN ?Place 1 drop into both eyes at bedtime. ?  ?LORazepam 1 MG tablet ?Commonly known as: ATIVAN ?TAKE 1 TABLET BY MOUTH THREE TIMES DAILY AS NEEDED FOR ANXIETY ?  ?losartan 100 MG tablet ?Commonly known as: COZAAR ?Take 100 mg by mouth every morning. ?  ?metoprolol succinate 25 MG 24 hr tablet ?Commonly known as: TOPROL-XL ?Take 25 mg by mouth daily. 1/2 tablet daily ?  ?nitrofurantoin 50 MG capsule ?Commonly known as: MACRODANTIN ?Take 1 capsule (50 mg total) by mouth at bedtime. ?  ?Os-Cal Ultra 600 MG Tabs ?Take 1 tablet by mouth daily. ?  ?polyethylene glycol 17 g packet ?Commonly known as: MIRALAX / GLYCOLAX ?Take 17 g by mouth at bedtime. ?  ?potassium chloride SA 20 MEQ tablet ?Commonly known as: KLOR-CON M ?Take 1 tablet by mouth twice daily ?  ?pravastatin 40 MG tablet ?Commonly known as: PRAVACHOL ?Take 1 tablet by mouth at bedtime. ?  ?ProAir HFA 108 (90 Base) MCG/ACT inhaler ?Generic drug: albuterol ?INHALE 2 PUFFS BY MOUTH EVERY 6 HOURS AS NEEDED FOR WHEEZING FOR SHORTNESS OF BREATH ?What changed: See the new instructions. ?  ?sodium chloride 1 g tablet ?Take 1 g by mouth 3 (three) times daily. ?  ?Symbicort 160-4.5 MCG/ACT inhaler ?Generic drug: budesonide-formoterol ?INHALE 2 PUFFS BY MOUTH  TWICE DAILY THEN  RINSE  MOUTH ?  ?SYSTANE OP ?Place 1 drop into both eyes daily. ?  ?Zinc 25 MG Tabs ?Take 1 tablet by mouth daily. ?  ?zolpidem 10 MG tablet ?Commonly known as: AMBIEN ?Take 1 tablet by mouth at bedtime. ?  ? ?  ? ? ?Allergies:  ?Allergies  ?Allergen Reactions  ? Bactrim [Sulfamethoxazole-Trimethoprim]   ? Prednisone   ?  Pt is high functioning and out of sorts.  ? Codeine Other (See Comments)  ?   REACTION: groggy  ? Tape Other (See Comments)  ?  Skin tears, use paper tape  ? ? ?Family History: ?Family History  ?Problem Relation Age of Onset  ? Cancer Father   ?     oral cancer  ? Breast cancer Mother   ? Heart attack Mother   ? Hypertension Sister   ?     Bypass x4  ? Alcohol abuse Sister   ? Alzheimer's disease Sister   ? Kidney disease Sister   ?  Colon cancer Neg Hx   ? ? ?Social History:  reports that he quit smoking about 35 years ago. His smoking use included cigarettes. He started smoking about 64 years ago. He has a 45.00 pack-year smoking history. He has never used smokeless tobacco. He reports that he does not drink alcohol and does not use drugs. ? ?ROS: ?All other review of systems were reviewed and are negative except what is noted above in HPI ? ?Physical Exam: ?There were no vitals taken for this visit.  ?Constitutional:  Alert and oriented, No acute distress. ?HEENT: [place] AT, moist mucus membranes.  Trachea midline, no masses. ?Cardiovascular: No clubbing, cyanosis, or edema. ?Respiratory: Normal respiratory effort, no increased work of breathing. ?GI: Abdomen is soft, nontender, nondistended, no abdominal masses ?GU: No CVA tenderness.  ?Lymph: No cervical or inguinal lymphadenopathy. ?Skin: No rashes, bruises or suspicious lesions. ?Neurologic: Grossly intact, no focal deficits, moving all 4 extremities. ?Psychiatric: Normal mood and affect. ? ?Laboratory Data: ?Lab Results  ?Component Value Date  ? WBC 7.1 11/28/2020  ? HGB 11.3 (L) 11/28/2020  ? HCT 32.9 (L) 11/28/2020  ? MCV 90.6 11/28/2020  ? PLT 450 (H) 11/28/2020  ? ? ?Lab Results  ?Component Value Date  ? CREATININE 0.68 11/28/2020  ? ? ?Lab Results  ?Component Value Date  ? PSA 3.60 01/06/2009  ? PSA 3.05 03/25/2007  ? ? ?No results found for: TESTOSTERONE ? ?No results found for: HGBA1C ? ?Urinalysis ?   ?Component Value Date/Time  ? COLORURINE STRAW (A) 11/21/2020 1230  ? APPEARANCEUR Clear 10/11/2021 1318  ? LABSPEC 1.005 11/21/2020  1230  ? PHURINE 7.0 11/21/2020 1230  ? GLUCOSEU Negative 10/11/2021 1318  ? [place] NEGATIVE 11/21/2020 1230  ? HGBUR negative 10/27/2008 0838  ? BILIRUBINUR Negative 10/11/2021 1318  ? [place] NEGATIVE 02/07/

## 2022-01-29 NOTE — Patient Instructions (Signed)

## 2022-01-30 DIAGNOSIS — L110 Acquired keratosis follicularis: Secondary | ICD-10-CM | POA: Diagnosis not present

## 2022-01-30 DIAGNOSIS — I739 Peripheral vascular disease, unspecified: Secondary | ICD-10-CM | POA: Diagnosis not present

## 2022-01-30 DIAGNOSIS — M79674 Pain in right toe(s): Secondary | ICD-10-CM | POA: Diagnosis not present

## 2022-01-30 DIAGNOSIS — M79675 Pain in left toe(s): Secondary | ICD-10-CM | POA: Diagnosis not present

## 2022-01-30 DIAGNOSIS — M79671 Pain in right foot: Secondary | ICD-10-CM | POA: Diagnosis not present

## 2022-01-30 DIAGNOSIS — M79672 Pain in left foot: Secondary | ICD-10-CM | POA: Diagnosis not present

## 2022-01-30 LAB — URINALYSIS, ROUTINE W REFLEX MICROSCOPIC
Bilirubin, UA: NEGATIVE
Glucose, UA: NEGATIVE
Ketones, UA: NEGATIVE
Nitrite, UA: NEGATIVE
Protein,UA: NEGATIVE
RBC, UA: NEGATIVE
Specific Gravity, UA: 1.01 (ref 1.005–1.030)
Urobilinogen, Ur: 0.2 mg/dL (ref 0.2–1.0)
pH, UA: 6.5 (ref 5.0–7.5)

## 2022-01-30 LAB — MICROSCOPIC EXAMINATION
Renal Epithel, UA: NONE SEEN /hpf
WBC, UA: >30 /hpf — AB (ref 0–5)

## 2022-01-31 DIAGNOSIS — G319 Degenerative disease of nervous system, unspecified: Secondary | ICD-10-CM | POA: Diagnosis not present

## 2022-01-31 DIAGNOSIS — S0990XA Unspecified injury of head, initial encounter: Secondary | ICD-10-CM | POA: Diagnosis not present

## 2022-01-31 DIAGNOSIS — R519 Headache, unspecified: Secondary | ICD-10-CM | POA: Diagnosis not present

## 2022-02-05 DIAGNOSIS — R519 Headache, unspecified: Secondary | ICD-10-CM | POA: Diagnosis not present

## 2022-02-05 DIAGNOSIS — R2681 Unsteadiness on feet: Secondary | ICD-10-CM | POA: Diagnosis not present

## 2022-02-05 DIAGNOSIS — Z6837 Body mass index (BMI) 37.0-37.9, adult: Secondary | ICD-10-CM | POA: Diagnosis not present

## 2022-02-05 DIAGNOSIS — E871 Hypo-osmolality and hyponatremia: Secondary | ICD-10-CM | POA: Diagnosis not present

## 2022-02-05 DIAGNOSIS — I4819 Other persistent atrial fibrillation: Secondary | ICD-10-CM | POA: Diagnosis not present

## 2022-02-05 DIAGNOSIS — J4520 Mild intermittent asthma, uncomplicated: Secondary | ICD-10-CM | POA: Diagnosis not present

## 2022-02-05 DIAGNOSIS — E7849 Other hyperlipidemia: Secondary | ICD-10-CM | POA: Diagnosis not present

## 2022-02-05 DIAGNOSIS — I10 Essential (primary) hypertension: Secondary | ICD-10-CM | POA: Diagnosis not present

## 2022-02-07 DIAGNOSIS — R293 Abnormal posture: Secondary | ICD-10-CM | POA: Diagnosis not present

## 2022-02-07 DIAGNOSIS — Z9181 History of falling: Secondary | ICD-10-CM | POA: Diagnosis not present

## 2022-02-07 DIAGNOSIS — M6281 Muscle weakness (generalized): Secondary | ICD-10-CM | POA: Diagnosis not present

## 2022-02-07 NOTE — Progress Notes (Signed)
HPI ?male former smoker followed for allergic rhinitis, asthma,  history OSA, asbestos exposure/plaques, chronic insomnia ?Office spirometry 05/03/15- WNL- FVC 3.33/ 78%, FEV1 2.49/ 78%, r 0.75, FEF25-75% 1.92/ 69%.. ?----------------------------------------------------------------------------------------------- ? ? ?08/03/21- 87 year old male Former Smoker followed for allergic Rhinitis, Asthma,  history OSA/ weight loss, Asbestos exposure/plaques/ interstitial lung disease, chronic insomnia, Diverticulitis, SVT, AFib, HTN, GERD, Hyponatremia, ?-Azelastine nasal, Symbicort 160, Flonase, ProAir hfa,  ?Covid vax - 1 Phizer, 2 Moderna ?Flu vax-had ?Body weight today-245 lbs                     Wife here ?-----Follow up on pt.'s lungs. ?Aware he gets less exercise, put on weight and some increased DOE. No acute events, recent infections or cough, may occ wheeze a little. Symbicort good, rarely needs albuterol. ?Discussed pneumonia vaccines and agreed ok for now. Consider Prevnar 20 in future. ?PCP watching glucose and Na. ? ?02/08/22-  87 year old male Former Smoker followed for allergic Rhinitis, Asthma,  history OSA/ weight loss, Asbestos exposure/plaques/ interstitial lung disease, chronic insomnia, Diverticulitis, SVT, AFib, HTN, GERD, Hyponatremia, ?-Azelastine nasal, Symbicort 160, Flonase, ProAir hfa,  ?Covid vax - 1 Phizer, 2 Moderna ?Flu vax-had ?Body weight today-244 lbs ?Here with wife.  He feels his breathing and general health has been stable.  Has had some falls which have been evaluated and discussed.  We reviewed CT report pertinent to lung and pleura from last imaging.   ?CT chest abd pelvis 11/21/20- ?Lungs/Pleura: Bilateral calcified pleural plaques, likely sequela of ?prior asbestos exposure. No focal consolidation, pleural effusion, ?or pneumothorax. The central airways are patent ?IMPRESSION: ?1. Several nondisplaced left anterior rib fractures. No ?pneumothorax. ?2. No acute/traumatic intra-abdominal  or pelvic pathology. ?3. Aortic Atherosclerosis (ICD10-I70.0). ?  ?ROS See HPI  + = positive ?Constitutional:   No-   weight loss, night sweats, fevers, chills, fatigue, lassitude. ?HEENT:   + headaches,  No -difficulty swallowing, tooth/dental problems, sore throat,  ?     No-  sneezing, itching, ear ache,  +nasal congestion, +post nasal drip,  ?CV:  + chest pain, orthopnea, PND, swelling in lower extremities, no-anasarca, dizziness, palpitations ?Resp: No-   shortness of breath with exertion or at rest.   ?             productive cough,  No non-productive cough,  No-  coughing up of blood.   ?           No-   change in color of mucus.  Occ wheezing.   ?Skin: No-   rash or lesions. ?GI:  No-   heartburn, indigestion, abdominal pain, nausea, vomiting,             ?GU:  ?MS:  + joint pain or swelling. .  + back pain. ?Neuro-  nothing unusual  ?Psych:  No- change in mood or affect. No depression or anxiety.  No memory loss. ? ?OBJ  ?General- Alert, Oriented, Affect-appropriate, Distress- none acute, + obese,  ?Skin- rash-none, lesions- none, excoriation- none,  ?Lymphadenopathy- none ?Head- atraumatic ?           Eyes- Gross vision intact, PERRLA, conjunctivae clear secretions ?           Ears- +hard of hearing/hearing aid ?           Nose- + turbinate edema, no-Septal dev, mucus, polyps, erosion, perforation  ?           Throat- Mallampati II , mucosa clear , drainage- none, tonsils-  atrophic. Dental repair. ?Neck- flexible , trachea midline, no stridor , thyroid nl, carotid no bruit ?Chest - symmetrical excursion , unlabored ?          Heart/CV-  RRR (hx AFib) , +2/6 S murmur , no gallop  , no rub, nl s1 s2 ?                          - JVD- none , edema + chronic right lower leg, varices- none ?          Lungs-  clear,  dullness-none, rub- none.  ?          Chest wall- no rub heard ?Abd-  ?Br/ Gen/ Rectal- Not done, not indicated ?Extrem-  ?Neuro- grossly intact to observation. Speech is clear. ? ? ? ? ? ?

## 2022-02-08 ENCOUNTER — Encounter: Payer: Self-pay | Admitting: Internal Medicine

## 2022-02-08 ENCOUNTER — Ambulatory Visit (INDEPENDENT_AMBULATORY_CARE_PROVIDER_SITE_OTHER): Payer: Medicare Other | Admitting: Internal Medicine

## 2022-02-08 DIAGNOSIS — J61 Pneumoconiosis due to asbestos and other mineral fibers: Secondary | ICD-10-CM | POA: Diagnosis not present

## 2022-02-08 DIAGNOSIS — I4891 Unspecified atrial fibrillation: Secondary | ICD-10-CM | POA: Diagnosis not present

## 2022-02-08 DIAGNOSIS — I2584 Coronary atherosclerosis due to calcified coronary lesion: Secondary | ICD-10-CM | POA: Diagnosis not present

## 2022-02-08 DIAGNOSIS — I2510 Atherosclerotic heart disease of native coronary artery without angina pectoris: Secondary | ICD-10-CM

## 2022-02-08 DIAGNOSIS — J4520 Mild intermittent asthma, uncomplicated: Secondary | ICD-10-CM | POA: Diagnosis not present

## 2022-02-08 NOTE — Assessment & Plan Note (Signed)
CT scans show pleural plaques consistent with asbestos exposure but not significant interstitial fibrotic "asbestosis". ?

## 2022-02-08 NOTE — Assessment & Plan Note (Signed)
Heart rhythm feels regular very nearly so on exam today and is probably RSR. ?

## 2022-02-08 NOTE — Assessment & Plan Note (Signed)
He feels controlled with Symbicort and his rescue inhaler.  No acute exacerbation ?

## 2022-02-08 NOTE — Patient Instructions (Signed)
Glad your breathing seems stable ? ?Please call if we can help ?

## 2022-02-12 DIAGNOSIS — N3281 Overactive bladder: Secondary | ICD-10-CM | POA: Diagnosis not present

## 2022-02-14 DIAGNOSIS — M6281 Muscle weakness (generalized): Secondary | ICD-10-CM | POA: Diagnosis not present

## 2022-02-14 DIAGNOSIS — R293 Abnormal posture: Secondary | ICD-10-CM | POA: Diagnosis not present

## 2022-02-14 DIAGNOSIS — Z9181 History of falling: Secondary | ICD-10-CM | POA: Diagnosis not present

## 2022-02-19 ENCOUNTER — Telehealth: Payer: Self-pay | Admitting: Cardiology

## 2022-02-19 ENCOUNTER — Ambulatory Visit (INDEPENDENT_AMBULATORY_CARE_PROVIDER_SITE_OTHER): Payer: Medicare Other | Admitting: *Deleted

## 2022-02-19 VITALS — BP 128/70 | HR 70 | Ht 69.0 in | Wt 247.4 lb

## 2022-02-19 DIAGNOSIS — I10 Essential (primary) hypertension: Secondary | ICD-10-CM | POA: Diagnosis not present

## 2022-02-19 DIAGNOSIS — Z9181 History of falling: Secondary | ICD-10-CM | POA: Diagnosis not present

## 2022-02-19 DIAGNOSIS — R293 Abnormal posture: Secondary | ICD-10-CM | POA: Diagnosis not present

## 2022-02-19 DIAGNOSIS — M6281 Muscle weakness (generalized): Secondary | ICD-10-CM | POA: Diagnosis not present

## 2022-02-19 NOTE — Telephone Encounter (Signed)
Nurse visit done to check BP ?See encounter for details ?

## 2022-02-19 NOTE — Telephone Encounter (Signed)
Patient and his wife walked into the office stating that pt fell and hit the back of his head on concrete and ever since then he's been having a constant headache. States he did have a CT done, but it did not show anything.  ? ?He's been having elevated blood pressure- last night it was 200 something /111- he was having pains in his chest last night so his wife gave him an extra blood pressure pill.  ? ?Would like to have BP checked by nurse, I will have pt sit in the lobby and inform clinical staff.  ? ? ?

## 2022-02-19 NOTE — Patient Instructions (Signed)
Your physician recommends that you continue on your current medications as directed. Please refer to the Current Medication list given to you today. Follow up as planned 

## 2022-02-19 NOTE — Progress Notes (Signed)
Presents to office after walking in requesting to have his BP checked and reporting elevated BP home readings. Did not check BP this morning but says last night was 203/111 around 8:00 pm. Wife gave an extra 100 mg hydralazine last night. Did not recheck BP.  ?Reports chest pain rated 5/10. Took tylenol and says pain resolved. Says he thought it was due to eating spaghetti.  ?Denies SOB and dizziness ?Denies active chest pain ?Medications reviewed ?Reports taking all doses of medications as prescribed without side effects ?Vitals taken after sitting for 10 minutes ? ?Blood pressure monitor has not been checked for accuracy ?Advised to have monitor checked for accuracy, request that you regularly monitor and record your blood pressure readings at home. Please use the same machine at the same time of day to check your readings and write them on the BP form given to you today. Call office in one week with readings ? ?Advised if chest pain returns, to call our office ?

## 2022-02-19 NOTE — Telephone Encounter (Signed)
Julian Fowler informed. ?

## 2022-02-19 NOTE — Telephone Encounter (Signed)
Wants to make Dr. Domenic Polite aware that pt's BP was up today when he was seen in their office. They state that pt's BP was 180/90 and pt was non symptomatic. Please advise ?

## 2022-02-22 DIAGNOSIS — Z9181 History of falling: Secondary | ICD-10-CM | POA: Diagnosis not present

## 2022-02-22 DIAGNOSIS — M6281 Muscle weakness (generalized): Secondary | ICD-10-CM | POA: Diagnosis not present

## 2022-02-22 DIAGNOSIS — R293 Abnormal posture: Secondary | ICD-10-CM | POA: Diagnosis not present

## 2022-02-26 ENCOUNTER — Encounter: Payer: Self-pay | Admitting: Urology

## 2022-02-26 ENCOUNTER — Ambulatory Visit: Payer: Medicare Other | Admitting: Urology

## 2022-02-26 ENCOUNTER — Ambulatory Visit (INDEPENDENT_AMBULATORY_CARE_PROVIDER_SITE_OTHER): Payer: Medicare Other | Admitting: Urology

## 2022-02-26 VITALS — BP 162/80 | HR 61

## 2022-02-26 DIAGNOSIS — N3281 Overactive bladder: Secondary | ICD-10-CM | POA: Diagnosis not present

## 2022-02-26 DIAGNOSIS — I2510 Atherosclerotic heart disease of native coronary artery without angina pectoris: Secondary | ICD-10-CM

## 2022-02-26 DIAGNOSIS — N138 Other obstructive and reflux uropathy: Secondary | ICD-10-CM

## 2022-02-26 DIAGNOSIS — N401 Enlarged prostate with lower urinary tract symptoms: Secondary | ICD-10-CM | POA: Diagnosis not present

## 2022-02-26 DIAGNOSIS — I2584 Coronary atherosclerosis due to calcified coronary lesion: Secondary | ICD-10-CM

## 2022-02-26 LAB — URINALYSIS, ROUTINE W REFLEX MICROSCOPIC
Bilirubin, UA: NEGATIVE
Glucose, UA: NEGATIVE
Ketones, UA: NEGATIVE
Nitrite, UA: NEGATIVE
RBC, UA: NEGATIVE
Specific Gravity, UA: 1.015 (ref 1.005–1.030)
Urobilinogen, Ur: 1 mg/dL (ref 0.2–1.0)
pH, UA: 7.5 (ref 5.0–7.5)

## 2022-02-26 LAB — MICROSCOPIC EXAMINATION
RBC, Urine: NONE SEEN /hpf (ref 0–2)
Renal Epithel, UA: NONE SEEN /hpf

## 2022-02-26 MED ORDER — ALFUZOSIN HCL ER 10 MG PO TB24: 10.0000 mg | ORAL_TABLET | Freq: Every day | ORAL | 11 refills | Status: DC

## 2022-02-26 NOTE — Progress Notes (Signed)
? ?02/26/2022 ?11:37 AM  ? ?Julian Fowler ?11/03/1934 ?[id number] ? ?Referring provider: [name], MD ?[address] ?[place],  [place] [address] ? ?Followup OAB ? ? ?HPI: ?Mr Julian Fowler is a 87yo here for followup for BPH and OAB. He underwent urodynamics which showed a 240cc bladder capacity, numerous unstable detrusor contractions. Flow 10cc/sec. Max detrusor pressure 45cm H20. PVR 71cc. He is on uroxatral '10mg'$  qhs. He continues to have urinary urgency and frequency. Urine stream is strong.  No UTIs since last visit.  ? ? ?PMH: ?Past Medical History:  ?Diagnosis Date  ? Allergic rhinitis   ? Aortic stenosis   ? Arthritis   ? Atrial fibrillation ([place])   ? BPH (benign prostatic hyperplasia)   ? Coronary artery calcification seen on CT scan   ? Easy bruisability   ? ED (erectile dysfunction)   ? Essential hypertension   ? GERD (gastroesophageal reflux disease)   ? H/O hiatal hernia   ? Hearing loss in left ear   ? History of asbestosis   ? History of shingles   ? Hyperlipidemia   ? Overactive bladder   ? PSVT (paroxysmal supraventricular tachycardia) ([place])   ? Sleep apnea   ? No longer on CPAP following weight loss  ? Tinnitus   ? ? ?Surgical History: ?Past Surgical History:  ?Procedure Laterality Date  ? BACK SURGERY  2014  ? herniated L1, L2   Dr Carloyn Manner  ? BRAIN SURGERY    ? CEREBRAL EMBOLIZATION  12/2011  ? "radiation therapy-did not work"  ? CHOLECYSTECTOMY  6/98  ? COLONOSCOPY  09/2009  ? Dr. Lindalou Hose: normal, internal hemorrhoids   ? COLONOSCOPY N/A 02/28/2017  ? Dr. Gala Romney: Hemorrhoids, grade 3, mild diverticulosis.  ? CRANIECTOMY FOR EXCISION OF ACOUSTIC NEUROMA  3/95  ? MINOR AMPUTATION OF DIGIT Left 12/31/2018  ? Procedure: REVISION AMPUTATION OF LEFT INDEX FINGER, IRRIGATION AND DEBRIDEMENT LEFT INDEX FINGER;  Surgeon: Verner Mould, MD;  Location: [facility];  Service: Orthopedics;  Laterality: Left;  ? RADIOLOGY WITH ANESTHESIA N/A 07/07/2014  ? Procedure: EMBOLIZATION;  Surgeon: Rob Hickman, MD;   Location: [facility];  Service: Radiology;  Laterality: N/A;  ? TOTAL KNEE ARTHROPLASTY Left 07/06/2013  ? Procedure: LEFT TOTAL KNEE ARTHROPLASTY;  Surgeon: Gearlean Alf, MD;  Location: [facility];  Service: Orthopedics;  Laterality: Left;  ? TOTAL KNEE ARTHROPLASTY Right 01/18/2014  ? Procedure: RIGHT TOTAL KNEE ARTHROPLASTY;  Surgeon: Gearlean Alf, MD;  Location: [facility];  Service: Orthopedics;  Laterality: Right;  ? TRIGGER FINGER RELEASE  2003  ? (thumb) middle finger (2006)  ? ? ?Home Medications:  ?Allergies as of 02/26/2022   ? ?   Reactions  ? Bactrim [sulfamethoxazole-trimethoprim]   ? Prednisone   ? Pt is high functioning and out of sorts.  ? Codeine Other (See Comments)  ? REACTION: groggy  ? Tape Other (See Comments)  ? Skin tears, use paper tape  ? ?  ? ?  ?Medication List  ?  ? ?  ? Accurate as of Feb 26, 2022 11:37 AM. If you have any questions, ask your nurse or doctor.  ?  ?  ? ?  ? ?acetaminophen 325 MG tablet ?Commonly known as: TYLENOL ?Take 325 mg by mouth as needed. ?  ?alfuzosin 10 MG 24 hr tablet ?Commonly known as: UROXATRAL ?Take 1 tablet (10 mg total) by mouth at bedtime. ?  ?azelastine 0.1 % nasal spray ?Commonly known as: ASTELIN ?1-2  puffs each nostril every 8 hours if needed for drainage ?What changed:  ?when to take this ?reasons to take this ?  ?Benefiber Powd ?Take 1 packet by mouth 2 (two) times daily. ?  ?CENTRUM SILVER PO ?Take 1 tablet by mouth daily. ?  ?diclofenac Sodium 1 % Gel ?Commonly known as: VOLTAREN ?Apply 2 g topically daily as needed (For pain). ?  ?esomeprazole 40 MG capsule ?Commonly known as: [place] ?TAKE 1 CAPSULE BY MOUTH TWICE DAILY BEFORE A MEAL ?  ?fexofenadine 180 MG tablet ?Commonly known as: ALLEGRA ?Take 180 mg by mouth daily as needed for allergies. ?  ?fluticasone 50 MCG/ACT nasal spray ?Commonly known as: FLONASE ?Place 2 sprays into both nostrils daily. ?What changed:  ?when to take this ?reasons to take this ?  ?furosemide 20 MG tablet ?Commonly known as:  LASIX ?Take 1 tablet (20 mg total) by mouth daily. ?  ?hydrALAZINE 50 MG tablet ?Commonly known as: APRESOLINE ?TAKE 1 & 1/2 (ONE & ONE-HALF) TABLETS BY MOUTH THREE TIMES DAILY ?  ?latanoprost 0.005 % ophthalmic solution ?Commonly known as: XALATAN ?Place 1 drop into both eyes at bedtime. ?  ?LORazepam 1 MG tablet ?Commonly known as: ATIVAN ?TAKE 1 TABLET BY MOUTH THREE TIMES DAILY AS NEEDED FOR ANXIETY ?  ?losartan 100 MG tablet ?Commonly known as: COZAAR ?Take 100 mg by mouth every morning. ?  ?metoprolol succinate 25 MG 24 hr tablet ?Commonly known as: TOPROL-XL ?Take 25 mg by mouth daily. 1/2 tablet daily ?  ?nitrofurantoin 50 MG capsule ?Commonly known as: MACRODANTIN ?Take 1 capsule (50 mg total) by mouth at bedtime. ?  ?Os-Cal Ultra 600 MG Tabs ?Take 1 tablet by mouth daily. ?  ?polyethylene glycol 17 g packet ?Commonly known as: MIRALAX / GLYCOLAX ?Take 17 g by mouth at bedtime. ?  ?potassium chloride SA 20 MEQ tablet ?Commonly known as: KLOR-CON M ?Take 1 tablet by mouth twice daily ?  ?pravastatin 40 MG tablet ?Commonly known as: PRAVACHOL ?Take 1 tablet by mouth at bedtime. ?  ?ProAir HFA 108 (90 Base) MCG/ACT inhaler ?Generic drug: albuterol ?INHALE 2 PUFFS BY MOUTH EVERY 6 HOURS AS NEEDED FOR WHEEZING FOR SHORTNESS OF BREATH ?What changed: See the new instructions. ?  ?sodium chloride 1 g tablet ?Take 1 g by mouth 3 (three) times daily. ?  ?Symbicort 160-4.5 MCG/ACT inhaler ?Generic drug: budesonide-formoterol ?INHALE 2 PUFFS BY MOUTH  TWICE DAILY THEN  RINSE  MOUTH ?  ?SYSTANE OP ?Place 1 drop into both eyes daily. ?  ?Zinc 25 MG Tabs ?Take 1 tablet by mouth daily. ?  ?zolpidem 10 MG tablet ?Commonly known as: AMBIEN ?Take 1 tablet by mouth at bedtime. ?  ? ?  ? ? ?Allergies:  ?Allergies  ?Allergen Reactions  ? Bactrim [Sulfamethoxazole-Trimethoprim]   ? Prednisone   ?  Pt is high functioning and out of sorts.  ? Codeine Other (See Comments)  ?  REACTION: groggy  ? Tape Other (See Comments)  ?  Skin  tears, use paper tape  ? ? ?Family History: ?Family History  ?Problem Relation Age of Onset  ? Cancer Father   ?     oral cancer  ? Breast cancer Mother   ? Heart attack Mother   ? Hypertension Sister   ?     Bypass x4  ? Alcohol abuse Sister   ? Alzheimer's disease Sister   ? Kidney disease Sister   ? Colon cancer Neg Hx   ? ? ?Social History:  reports that he quit smoking about 35 years ago. His smoking use included cigarettes. He started smoking about 64 years ago. He has a 45.00 pack-year smoking history. He has never used smokeless tobacco. He reports that he does not drink alcohol and does not use drugs. ? ?ROS: ?All other review of systems were reviewed and are negative except what is noted above in HPI ? ?Physical Exam: ?BP (!) 162/80   Pulse 61   ?Constitutional:  Alert and oriented, No acute distress. ?HEENT: [place] AT, moist mucus membranes.  Trachea midline, no masses. ?Cardiovascular: No clubbing, cyanosis, or edema. ?Respiratory: Normal respiratory effort, no increased work of breathing. ?GI: Abdomen is soft, nontender, nondistended, no abdominal masses ?GU: No CVA tenderness.  ?Lymph: No cervical or inguinal lymphadenopathy. ?Skin: No rashes, bruises or suspicious lesions. ?Neurologic: Grossly intact, no focal deficits, moving all 4 extremities. ?Psychiatric: Normal mood and affect. ? ?Laboratory Data: ?Lab Results  ?Component Value Date  ? WBC 7.1 11/28/2020  ? HGB 11.3 (L) 11/28/2020  ? HCT 32.9 (L) 11/28/2020  ? MCV 90.6 11/28/2020  ? PLT 450 (H) 11/28/2020  ? ? ?Lab Results  ?Component Value Date  ? CREATININE 0.68 11/28/2020  ? ? ?Lab Results  ?Component Value Date  ? PSA 3.60 01/06/2009  ? PSA 3.05 03/25/2007  ? ? ?No results found for: TESTOSTERONE ? ?No results found for: HGBA1C ? ?Urinalysis ?   ?Component Value Date/Time  ? COLORURINE STRAW (A) 11/21/2020 1230  ? APPEARANCEUR Clear 01/29/2022 1542  ? LABSPEC 1.005 11/21/2020 1230  ? PHURINE 7.0 11/21/2020 1230  ? GLUCOSEU Negative 01/29/2022  1542  ? [place] NEGATIVE 11/21/2020 1230  ? HGBUR negative 10/27/2008 0838  ? BILIRUBINUR Negative 01/29/2022 1542  ? [place] NEGATIVE 11/21/2020 1230  ? PROTEINUR Negative 01/29/2022 1542  ? PROTEINUR NEGATIV

## 2022-02-26 NOTE — Patient Instructions (Signed)

## 2022-02-27 DIAGNOSIS — Z9181 History of falling: Secondary | ICD-10-CM | POA: Diagnosis not present

## 2022-02-27 DIAGNOSIS — R293 Abnormal posture: Secondary | ICD-10-CM | POA: Diagnosis not present

## 2022-02-27 DIAGNOSIS — M6281 Muscle weakness (generalized): Secondary | ICD-10-CM | POA: Diagnosis not present

## 2022-03-01 DIAGNOSIS — M6281 Muscle weakness (generalized): Secondary | ICD-10-CM | POA: Diagnosis not present

## 2022-03-01 DIAGNOSIS — Z9181 History of falling: Secondary | ICD-10-CM | POA: Diagnosis not present

## 2022-03-01 DIAGNOSIS — R293 Abnormal posture: Secondary | ICD-10-CM | POA: Diagnosis not present

## 2022-03-03 ENCOUNTER — Other Ambulatory Visit: Payer: Self-pay | Admitting: Cardiology

## 2022-03-03 ENCOUNTER — Other Ambulatory Visit: Payer: Self-pay | Admitting: Gastroenterology

## 2022-03-07 DIAGNOSIS — M6281 Muscle weakness (generalized): Secondary | ICD-10-CM | POA: Diagnosis not present

## 2022-03-07 DIAGNOSIS — R2681 Unsteadiness on feet: Secondary | ICD-10-CM | POA: Diagnosis not present

## 2022-03-07 DIAGNOSIS — D170 Benign lipomatous neoplasm of skin and subcutaneous tissue of head, face and neck: Secondary | ICD-10-CM | POA: Diagnosis not present

## 2022-03-07 DIAGNOSIS — E7849 Other hyperlipidemia: Secondary | ICD-10-CM | POA: Diagnosis not present

## 2022-03-07 DIAGNOSIS — R293 Abnormal posture: Secondary | ICD-10-CM | POA: Diagnosis not present

## 2022-03-07 DIAGNOSIS — Z6837 Body mass index (BMI) 37.0-37.9, adult: Secondary | ICD-10-CM | POA: Diagnosis not present

## 2022-03-07 DIAGNOSIS — I10 Essential (primary) hypertension: Secondary | ICD-10-CM | POA: Diagnosis not present

## 2022-03-07 DIAGNOSIS — I4819 Other persistent atrial fibrillation: Secondary | ICD-10-CM | POA: Diagnosis not present

## 2022-03-07 DIAGNOSIS — Z9181 History of falling: Secondary | ICD-10-CM | POA: Diagnosis not present

## 2022-03-07 DIAGNOSIS — R519 Headache, unspecified: Secondary | ICD-10-CM | POA: Diagnosis not present

## 2022-03-07 DIAGNOSIS — E871 Hypo-osmolality and hyponatremia: Secondary | ICD-10-CM | POA: Diagnosis not present

## 2022-03-08 ENCOUNTER — Other Ambulatory Visit: Payer: Self-pay | Admitting: Family Medicine

## 2022-03-08 DIAGNOSIS — R2681 Unsteadiness on feet: Secondary | ICD-10-CM

## 2022-03-08 DIAGNOSIS — G4452 New daily persistent headache (NDPH): Secondary | ICD-10-CM

## 2022-03-13 DIAGNOSIS — M6281 Muscle weakness (generalized): Secondary | ICD-10-CM | POA: Diagnosis not present

## 2022-03-13 DIAGNOSIS — R293 Abnormal posture: Secondary | ICD-10-CM | POA: Diagnosis not present

## 2022-03-13 DIAGNOSIS — Z9181 History of falling: Secondary | ICD-10-CM | POA: Diagnosis not present

## 2022-03-27 ENCOUNTER — Ambulatory Visit
Admission: RE | Admit: 2022-03-27 | Discharge: 2022-03-27 | Disposition: A | Discharge status: Home / Self Care | Payer: Medicare Other | Source: Ambulatory Visit | Attending: Family Medicine | Admitting: Family Medicine

## 2022-03-27 DIAGNOSIS — R519 Headache, unspecified: Secondary | ICD-10-CM | POA: Diagnosis not present

## 2022-03-27 DIAGNOSIS — G4452 New daily persistent headache (NDPH): Secondary | ICD-10-CM

## 2022-03-27 DIAGNOSIS — R2681 Unsteadiness on feet: Secondary | ICD-10-CM

## 2022-04-02 DIAGNOSIS — Z6837 Body mass index (BMI) 37.0-37.9, adult: Secondary | ICD-10-CM | POA: Diagnosis not present

## 2022-04-02 DIAGNOSIS — R519 Headache, unspecified: Secondary | ICD-10-CM | POA: Diagnosis not present

## 2022-04-02 DIAGNOSIS — E7849 Other hyperlipidemia: Secondary | ICD-10-CM | POA: Diagnosis not present

## 2022-04-02 DIAGNOSIS — R2681 Unsteadiness on feet: Secondary | ICD-10-CM | POA: Diagnosis not present

## 2022-04-02 DIAGNOSIS — M5030 Other cervical disc degeneration, unspecified cervical region: Secondary | ICD-10-CM | POA: Diagnosis not present

## 2022-04-02 DIAGNOSIS — M47812 Spondylosis without myelopathy or radiculopathy, cervical region: Secondary | ICD-10-CM | POA: Diagnosis not present

## 2022-04-02 DIAGNOSIS — D170 Benign lipomatous neoplasm of skin and subcutaneous tissue of head, face and neck: Secondary | ICD-10-CM | POA: Diagnosis not present

## 2022-04-02 DIAGNOSIS — I10 Essential (primary) hypertension: Secondary | ICD-10-CM | POA: Diagnosis not present

## 2022-04-02 DIAGNOSIS — I4819 Other persistent atrial fibrillation: Secondary | ICD-10-CM | POA: Diagnosis not present

## 2022-04-08 ENCOUNTER — Other Ambulatory Visit: Payer: Self-pay | Admitting: Internal Medicine

## 2022-04-10 NOTE — Telephone Encounter (Signed)
Dr. Maple Hudson, please advise on refill request.   Allergies  Allergen Reactions   Bactrim [Sulfamethoxazole-Trimethoprim]    Prednisone     Pt is high functioning and out of sorts.   Codeine Other (See Comments)    REACTION: groggy   Tape Other (See Comments)    Skin tears, use paper tape     Current Outpatient Medications:    acetaminophen (TYLENOL) 325 MG tablet, Take 325 mg by mouth as needed., Disp: , Rfl:    alfuzosin (UROXATRAL) 10 MG 24 hr tablet, Take 1 tablet (10 mg total) by mouth at bedtime., Disp: 30 tablet, Rfl: 11   azelastine (ASTELIN) 0.1 % nasal spray, 1-2 puffs each nostril every 8 hours if needed for drainage (Patient taking differently: as needed. 1-2 puffs each nostril every 8 hours if needed for drainage), Disp: 30 mL, Rfl: 12   Calcium Carb-Vit D-C-E-Mineral (OS-CAL ULTRA) 600 MG TABS, Take 1 tablet by mouth daily. , Disp: , Rfl:    diclofenac Sodium (VOLTAREN) 1 % GEL, Apply 2 g topically daily as needed (For pain)., Disp: , Rfl:    esomeprazole (NEXIUM) 40 MG capsule, TAKE 1 CAPSULE BY MOUTH TWICE DAILY BEFORE A MEAL, Disp: 60 capsule, Rfl: 5   fexofenadine (ALLEGRA) 180 MG tablet, Take 180 mg by mouth daily as needed for allergies., Disp: , Rfl:    fluticasone (FLONASE) 50 MCG/ACT nasal spray, Place 2 sprays into both nostrils daily. (Patient taking differently: Place 2 sprays into both nostrils daily as needed for allergies.), Disp: 18.2 g, Rfl: 12   furosemide (LASIX) 20 MG tablet, Take 1 tablet (20 mg total) by mouth daily., Disp: 30 tablet, Rfl: 3   hydrALAZINE (APRESOLINE) 50 MG tablet, TAKE 1 & 1/2 (ONE & ONE-HALF) TABLETS BY MOUTH THREE TIMES DAILY, Disp: 410 tablet, Rfl: 2   latanoprost (XALATAN) 0.005 % ophthalmic solution, Place 1 drop into both eyes at bedtime. , Disp: , Rfl:    LORazepam (ATIVAN) 1 MG tablet, TAKE 1 TABLET BY MOUTH THREE TIMES DAILY AS NEEDED FOR ANXIETY, Disp: 90 tablet, Rfl: 5   losartan (COZAAR) 100 MG tablet, Take 100 mg by mouth  every morning. , Disp: , Rfl:    metoprolol succinate (TOPROL-XL) 25 MG 24 hr tablet, Take 25 mg by mouth daily. 1/2 tablet daily, Disp: , Rfl:    Multiple Vitamins-Minerals (CENTRUM SILVER PO), Take 1 tablet by mouth daily. , Disp: , Rfl:    nitrofurantoin (MACRODANTIN) 50 MG capsule, Take 1 capsule (50 mg total) by mouth at bedtime., Disp: 30 capsule, Rfl: 11   Polyethyl Glycol-Propyl Glycol (SYSTANE OP), Place 1 drop into both eyes daily., Disp: , Rfl:    polyethylene glycol (MIRALAX / GLYCOLAX) packet, Take 17 g by mouth at bedtime., Disp: , Rfl:    potassium chloride SA (KLOR-CON M) 20 MEQ tablet, Take 1 tablet by mouth twice daily, Disp: 180 tablet, Rfl: 1   pravastatin (PRAVACHOL) 40 MG tablet, Take 1 tablet by mouth at bedtime. , Disp: , Rfl:    PROAIR HFA 108 (90 Base) MCG/ACT inhaler, INHALE 2 PUFFS BY MOUTH EVERY 6 HOURS AS NEEDED FOR WHEEZING FOR SHORTNESS OF BREATH (Patient taking differently: Inhale 1 puff into the lungs every 6 (six) hours as needed.), Disp: 18 g, Rfl: 12   sodium chloride 1 g tablet, Take 1 g by mouth 3 (three) times daily., Disp: , Rfl:    SYMBICORT 160-4.5 MCG/ACT inhaler, INHALE 2 PUFFS BY MOUTH  TWICE DAILY THEN  RINSE  MOUTH, Disp: 11 g, Rfl: 6   Wheat Dextrin (BENEFIBER) POWD, Take 1 packet by mouth 2 (two) times daily., Disp: , Rfl:    Zinc 25 MG TABS, Take 1 tablet by mouth daily., Disp: , Rfl:    zolpidem (AMBIEN) 10 MG tablet, Take 1 tablet by mouth at bedtime. , Disp: , Rfl:

## 2022-04-10 NOTE — Telephone Encounter (Signed)
Lorazepam refilled

## 2022-04-12 DIAGNOSIS — M6281 Muscle weakness (generalized): Secondary | ICD-10-CM | POA: Diagnosis not present

## 2022-04-12 DIAGNOSIS — L110 Acquired keratosis follicularis: Secondary | ICD-10-CM | POA: Diagnosis not present

## 2022-04-12 DIAGNOSIS — I739 Peripheral vascular disease, unspecified: Secondary | ICD-10-CM | POA: Diagnosis not present

## 2022-04-12 DIAGNOSIS — R293 Abnormal posture: Secondary | ICD-10-CM | POA: Diagnosis not present

## 2022-04-12 DIAGNOSIS — M79675 Pain in left toe(s): Secondary | ICD-10-CM | POA: Diagnosis not present

## 2022-04-12 DIAGNOSIS — M542 Cervicalgia: Secondary | ICD-10-CM | POA: Diagnosis not present

## 2022-04-12 DIAGNOSIS — Z9181 History of falling: Secondary | ICD-10-CM | POA: Diagnosis not present

## 2022-04-12 DIAGNOSIS — M79671 Pain in right foot: Secondary | ICD-10-CM | POA: Diagnosis not present

## 2022-04-12 DIAGNOSIS — M79674 Pain in right toe(s): Secondary | ICD-10-CM | POA: Diagnosis not present

## 2022-04-12 DIAGNOSIS — M79672 Pain in left foot: Secondary | ICD-10-CM | POA: Diagnosis not present

## 2022-04-16 DIAGNOSIS — M542 Cervicalgia: Secondary | ICD-10-CM | POA: Diagnosis not present

## 2022-04-16 DIAGNOSIS — Z9181 History of falling: Secondary | ICD-10-CM | POA: Diagnosis not present

## 2022-04-16 DIAGNOSIS — M6281 Muscle weakness (generalized): Secondary | ICD-10-CM | POA: Diagnosis not present

## 2022-04-16 DIAGNOSIS — R293 Abnormal posture: Secondary | ICD-10-CM | POA: Diagnosis not present

## 2022-04-18 DIAGNOSIS — M542 Cervicalgia: Secondary | ICD-10-CM | POA: Diagnosis not present

## 2022-04-18 DIAGNOSIS — M6281 Muscle weakness (generalized): Secondary | ICD-10-CM | POA: Diagnosis not present

## 2022-04-18 DIAGNOSIS — R293 Abnormal posture: Secondary | ICD-10-CM | POA: Diagnosis not present

## 2022-04-18 DIAGNOSIS — Z9181 History of falling: Secondary | ICD-10-CM | POA: Diagnosis not present

## 2022-04-19 DIAGNOSIS — H903 Sensorineural hearing loss, bilateral: Secondary | ICD-10-CM | POA: Diagnosis not present

## 2022-04-19 DIAGNOSIS — Z923 Personal history of irradiation: Secondary | ICD-10-CM | POA: Diagnosis not present

## 2022-04-19 DIAGNOSIS — H832X2 Labyrinthine dysfunction, left ear: Secondary | ICD-10-CM | POA: Diagnosis not present

## 2022-04-19 DIAGNOSIS — H9311 Tinnitus, right ear: Secondary | ICD-10-CM | POA: Diagnosis not present

## 2022-04-19 DIAGNOSIS — H90A21 Sensorineural hearing loss, unilateral, right ear, with restricted hearing on the contralateral side: Secondary | ICD-10-CM | POA: Diagnosis not present

## 2022-04-19 DIAGNOSIS — Z9889 Other specified postprocedural states: Secondary | ICD-10-CM | POA: Diagnosis not present

## 2022-04-19 DIAGNOSIS — D333 Benign neoplasm of cranial nerves: Secondary | ICD-10-CM | POA: Diagnosis not present

## 2022-04-19 DIAGNOSIS — R42 Dizziness and giddiness: Secondary | ICD-10-CM | POA: Diagnosis not present

## 2022-04-25 DIAGNOSIS — R293 Abnormal posture: Secondary | ICD-10-CM | POA: Diagnosis not present

## 2022-04-25 DIAGNOSIS — Z9181 History of falling: Secondary | ICD-10-CM | POA: Diagnosis not present

## 2022-04-25 DIAGNOSIS — M542 Cervicalgia: Secondary | ICD-10-CM | POA: Diagnosis not present

## 2022-04-25 DIAGNOSIS — M6281 Muscle weakness (generalized): Secondary | ICD-10-CM | POA: Diagnosis not present

## 2022-04-26 DIAGNOSIS — Z9181 History of falling: Secondary | ICD-10-CM | POA: Diagnosis not present

## 2022-04-26 DIAGNOSIS — M6281 Muscle weakness (generalized): Secondary | ICD-10-CM | POA: Diagnosis not present

## 2022-04-26 DIAGNOSIS — M542 Cervicalgia: Secondary | ICD-10-CM | POA: Diagnosis not present

## 2022-04-26 DIAGNOSIS — R293 Abnormal posture: Secondary | ICD-10-CM | POA: Diagnosis not present

## 2022-05-02 DIAGNOSIS — M6281 Muscle weakness (generalized): Secondary | ICD-10-CM | POA: Diagnosis not present

## 2022-05-02 DIAGNOSIS — R293 Abnormal posture: Secondary | ICD-10-CM | POA: Diagnosis not present

## 2022-05-02 DIAGNOSIS — M542 Cervicalgia: Secondary | ICD-10-CM | POA: Diagnosis not present

## 2022-05-02 DIAGNOSIS — Z9181 History of falling: Secondary | ICD-10-CM | POA: Diagnosis not present

## 2022-05-07 DIAGNOSIS — Z9181 History of falling: Secondary | ICD-10-CM | POA: Diagnosis not present

## 2022-05-07 DIAGNOSIS — M6281 Muscle weakness (generalized): Secondary | ICD-10-CM | POA: Diagnosis not present

## 2022-05-07 DIAGNOSIS — M542 Cervicalgia: Secondary | ICD-10-CM | POA: Diagnosis not present

## 2022-05-07 DIAGNOSIS — R293 Abnormal posture: Secondary | ICD-10-CM | POA: Diagnosis not present

## 2022-05-09 DIAGNOSIS — Z9181 History of falling: Secondary | ICD-10-CM | POA: Diagnosis not present

## 2022-05-09 DIAGNOSIS — M542 Cervicalgia: Secondary | ICD-10-CM | POA: Diagnosis not present

## 2022-05-09 DIAGNOSIS — R293 Abnormal posture: Secondary | ICD-10-CM | POA: Diagnosis not present

## 2022-05-09 DIAGNOSIS — M6281 Muscle weakness (generalized): Secondary | ICD-10-CM | POA: Diagnosis not present

## 2022-05-14 DIAGNOSIS — J920 Pleural plaque with presence of asbestos: Secondary | ICD-10-CM | POA: Diagnosis not present

## 2022-05-14 DIAGNOSIS — I10 Essential (primary) hypertension: Secondary | ICD-10-CM | POA: Diagnosis not present

## 2022-05-14 DIAGNOSIS — E785 Hyperlipidemia, unspecified: Secondary | ICD-10-CM | POA: Diagnosis not present

## 2022-05-14 DIAGNOSIS — E871 Hypo-osmolality and hyponatremia: Secondary | ICD-10-CM | POA: Diagnosis not present

## 2022-05-14 DIAGNOSIS — I4819 Other persistent atrial fibrillation: Secondary | ICD-10-CM | POA: Diagnosis not present

## 2022-05-15 DIAGNOSIS — E782 Mixed hyperlipidemia: Secondary | ICD-10-CM | POA: Diagnosis not present

## 2022-05-15 DIAGNOSIS — E7849 Other hyperlipidemia: Secondary | ICD-10-CM | POA: Diagnosis not present

## 2022-05-15 DIAGNOSIS — M542 Cervicalgia: Secondary | ICD-10-CM | POA: Diagnosis not present

## 2022-05-15 DIAGNOSIS — I10 Essential (primary) hypertension: Secondary | ICD-10-CM | POA: Diagnosis not present

## 2022-05-15 DIAGNOSIS — R7301 Impaired fasting glucose: Secondary | ICD-10-CM | POA: Diagnosis not present

## 2022-05-15 DIAGNOSIS — E871 Hypo-osmolality and hyponatremia: Secondary | ICD-10-CM | POA: Diagnosis not present

## 2022-05-15 DIAGNOSIS — Z9181 History of falling: Secondary | ICD-10-CM | POA: Diagnosis not present

## 2022-05-15 DIAGNOSIS — M6281 Muscle weakness (generalized): Secondary | ICD-10-CM | POA: Diagnosis not present

## 2022-05-15 DIAGNOSIS — E559 Vitamin D deficiency, unspecified: Secondary | ICD-10-CM | POA: Diagnosis not present

## 2022-05-15 DIAGNOSIS — R293 Abnormal posture: Secondary | ICD-10-CM | POA: Diagnosis not present

## 2022-05-15 DIAGNOSIS — D519 Vitamin B12 deficiency anemia, unspecified: Secondary | ICD-10-CM | POA: Diagnosis not present

## 2022-05-17 DIAGNOSIS — M6281 Muscle weakness (generalized): Secondary | ICD-10-CM | POA: Diagnosis not present

## 2022-05-17 DIAGNOSIS — M542 Cervicalgia: Secondary | ICD-10-CM | POA: Diagnosis not present

## 2022-05-17 DIAGNOSIS — R293 Abnormal posture: Secondary | ICD-10-CM | POA: Diagnosis not present

## 2022-05-17 DIAGNOSIS — Z9181 History of falling: Secondary | ICD-10-CM | POA: Diagnosis not present

## 2022-05-22 DIAGNOSIS — R519 Headache, unspecified: Secondary | ICD-10-CM | POA: Diagnosis not present

## 2022-05-22 DIAGNOSIS — M21371 Foot drop, right foot: Secondary | ICD-10-CM | POA: Diagnosis not present

## 2022-05-22 DIAGNOSIS — E7849 Other hyperlipidemia: Secondary | ICD-10-CM | POA: Diagnosis not present

## 2022-05-22 DIAGNOSIS — E871 Hypo-osmolality and hyponatremia: Secondary | ICD-10-CM | POA: Diagnosis not present

## 2022-05-22 DIAGNOSIS — I4819 Other persistent atrial fibrillation: Secondary | ICD-10-CM | POA: Diagnosis not present

## 2022-05-22 DIAGNOSIS — Z6837 Body mass index (BMI) 37.0-37.9, adult: Secondary | ICD-10-CM | POA: Diagnosis not present

## 2022-05-22 DIAGNOSIS — M48061 Spinal stenosis, lumbar region without neurogenic claudication: Secondary | ICD-10-CM | POA: Diagnosis not present

## 2022-05-22 DIAGNOSIS — R2681 Unsteadiness on feet: Secondary | ICD-10-CM | POA: Diagnosis not present

## 2022-05-22 DIAGNOSIS — I10 Essential (primary) hypertension: Secondary | ICD-10-CM | POA: Diagnosis not present

## 2022-05-22 DIAGNOSIS — J4520 Mild intermittent asthma, uncomplicated: Secondary | ICD-10-CM | POA: Diagnosis not present

## 2022-05-28 DIAGNOSIS — M542 Cervicalgia: Secondary | ICD-10-CM | POA: Diagnosis not present

## 2022-05-28 DIAGNOSIS — Z9181 History of falling: Secondary | ICD-10-CM | POA: Diagnosis not present

## 2022-05-28 DIAGNOSIS — R293 Abnormal posture: Secondary | ICD-10-CM | POA: Diagnosis not present

## 2022-05-28 DIAGNOSIS — M6281 Muscle weakness (generalized): Secondary | ICD-10-CM | POA: Diagnosis not present

## 2022-05-30 ENCOUNTER — Encounter: Payer: Self-pay | Admitting: Urology

## 2022-05-30 ENCOUNTER — Ambulatory Visit (INDEPENDENT_AMBULATORY_CARE_PROVIDER_SITE_OTHER): Payer: Medicare Other | Admitting: Urology

## 2022-05-30 VITALS — BP 174/89 | HR 66

## 2022-05-30 DIAGNOSIS — I2584 Coronary atherosclerosis due to calcified coronary lesion: Secondary | ICD-10-CM | POA: Diagnosis not present

## 2022-05-30 DIAGNOSIS — R350 Frequency of micturition: Secondary | ICD-10-CM | POA: Diagnosis not present

## 2022-05-30 DIAGNOSIS — N401 Enlarged prostate with lower urinary tract symptoms: Secondary | ICD-10-CM

## 2022-05-30 DIAGNOSIS — R3912 Poor urinary stream: Secondary | ICD-10-CM | POA: Diagnosis not present

## 2022-05-30 DIAGNOSIS — I2510 Atherosclerotic heart disease of native coronary artery without angina pectoris: Secondary | ICD-10-CM

## 2022-05-30 DIAGNOSIS — N390 Urinary tract infection, site not specified: Secondary | ICD-10-CM

## 2022-05-30 DIAGNOSIS — N3281 Overactive bladder: Secondary | ICD-10-CM

## 2022-05-30 DIAGNOSIS — N5201 Erectile dysfunction due to arterial insufficiency: Secondary | ICD-10-CM

## 2022-05-30 DIAGNOSIS — N138 Other obstructive and reflux uropathy: Secondary | ICD-10-CM

## 2022-05-30 MED ORDER — TADALAFIL 20 MG PO TABS
20.0000 mg | ORAL_TABLET | Freq: Every day | ORAL | 5 refills | Status: DC | PRN
Start: 2022-05-30 — End: 2023-12-11

## 2022-05-30 MED ORDER — ALFUZOSIN HCL ER 10 MG PO TB24: 10.0000 mg | ORAL_TABLET | Freq: Every day | ORAL | 11 refills | Status: DC

## 2022-05-30 NOTE — Patient Instructions (Signed)
Erectile Dysfunction ?Erectile dysfunction (ED) is the inability to get or keep an erection in order to have sexual intercourse. ED is considered a symptom of an underlying disorder and is not considered a disease. ED may include: ?Inability to get an erection. ?Lack of enough hardness of the erection to allow penetration. ?Loss of erection before sex is finished. ?What are the causes? ?This condition may be caused by: ?Physical causes, such as: ?Artery problems. This may include heart disease, high blood pressure, atherosclerosis, and diabetes. ?Hormonal problems, such as low testosterone. ?Obesity. ?Nerve problems. This may include back or pelvic injuries, multiple sclerosis, Parkinson's disease, spinal cord injury, and stroke. ?Certain medicines, such as: ?Pain relievers. ?Antidepressants. ?Blood pressure medicines and water pills (diuretics). ?Cancer medicines. ?Antihistamines. ?Muscle relaxants. ?Lifestyle factors, such as: ?Use of drugs such as marijuana, cocaine, or opioids. ?Excessive use of alcohol. ?Smoking. ?Lack of physical activity or exercise. ?Psychological causes, such as: ?Anxiety or stress. ?Sadness or depression. ?Exhaustion. ?Fear about sexual performance. ?Guilt. ?What are the signs or symptoms? ?Symptoms of this condition include: ?Inability to get an erection. ?Lack of enough hardness of the erection to allow penetration. ?Loss of the erection before sex is finished. ?Sometimes having normal erections, but with frequent unsatisfactory episodes. ?Low sexual satisfaction in either partner due to erection problems. ?A curved penis occurring with erection. The curve may cause pain, or the penis may be too curved to allow for intercourse. ?Never having nighttime or morning erections. ?How is this diagnosed? ?This condition is often diagnosed by: ?Performing a physical exam to find other diseases or specific problems with the penis. ?Asking you detailed questions about the problem. ?Doing tests,  such as: ?Blood tests to check for diabetes mellitus or high cholesterol, or to measure hormone levels. ?Other tests to check for underlying health conditions. ?An ultrasound exam to check for scarring. ?A test to check blood flow to the penis. ?Doing a sleep study at home to measure nighttime erections. ?How is this treated? ?This condition may be treated by: ?Medicines, such as: ?Medicine taken by mouth to help you achieve an erection (oral medicine). ?Hormone replacement therapy to replace low testosterone levels. ?Medicine that is injected into the penis. Your health care provider may instruct you how to give yourself these injections at home. ?Medicine that is delivered with a short applicator tube. The tube is inserted into the opening at the tip of the penis, which is the opening of the urethra. A tiny pellet of medicine is put in the urethra. The pellet dissolves and enhances erectile function. This is also called MUSE (medicated urethral system for erections) therapy. ?Vacuum pump. This is a pump with a ring on it. The pump and ring are placed on the penis and used to create pressure that helps the penis become erect. ?Penile implant surgery. In this procedure, you may receive: ?An inflatable implant. This consists of cylinders, a pump, and a reservoir. The cylinders can be inflated with a fluid that helps to create an erection, and they can be deflated after intercourse. ?A semi-rigid implant. This consists of two silicone rubber rods. The rods provide some rigidity. They are also flexible, so the penis can both curve downward in its normal position and become straight for sexual intercourse. ?Blood vessel surgery to improve blood flow to the penis. During this procedure, a blood vessel from a different part of the body is placed into the penis to allow blood to flow around (bypass) damaged or blocked blood vessels. ?Lifestyle changes,   such as exercising more, losing weight, and quitting smoking. ?Follow  these instructions at home: ?Medicines ? ?Take over-the-counter and prescription medicines only as told by your health care provider. Do not increase the dosage without first discussing it with your health care provider. ?If you are using self-injections, do injections as directed by your health care provider. Make sure you avoid any veins that are on the surface of the penis. After giving an injection, apply pressure to the injection site for 5 minutes. ?Talk to your health care provider about how to prevent headaches while taking ED medicines. These medicines may cause a sudden headache due to the increase in blood flow in your body. ?General instructions ?Exercise regularly, as directed by your health care provider. Work with your health care provider to lose weight, if needed. ?Do not use any products that contain nicotine or tobacco. These products include cigarettes, chewing tobacco, and vaping devices, such as e-cigarettes. If you need help quitting, ask your health care provider. ?Before using a vacuum pump, read the instructions that come with the pump and discuss any questions with your health care provider. ?Keep all follow-up visits. This is important. ?Contact a health care provider if: ?You feel nauseous. ?You are vomiting. ?You get sudden headaches while taking ED medicines. ?You have any concerns about your sexual health. ?Get help right away if: ?You are taking oral or injectable medicines and you have an erection that lasts longer than 4 hours. If your health care provider is unavailable, go to the nearest emergency room for evaluation. An erection that lasts much longer than 4 hours can result in permanent damage to your penis. ?You have severe pain in your groin or abdomen. ?You develop redness or severe swelling of your penis. ?You have redness spreading at your groin or lower abdomen. ?You are unable to urinate. ?You experience chest pain or a rapid heartbeat (palpitations) after taking oral  medicines. ?These symptoms may represent a serious problem that is an emergency. Do not wait to see if the symptoms will go away. Get medical help right away. Call your local emergency services (911 in the [place] Do not drive yourself to the hospital. ?Summary ?Erectile dysfunction (ED) is the inability to get or keep an erection during sexual intercourse. ?This condition is diagnosed based on a physical exam, your symptoms, and tests to determine the cause. Treatment varies depending on the cause and may include medicines, hormone therapy, surgery, or a vacuum pump. ?You may need follow-up visits to make sure that you are using your medicines or devices correctly. ?Get help right away if you are taking or injecting medicines and you have an erection that lasts longer than 4 hours. ?This information is not intended to replace advice given to you by your health care provider. Make sure you discuss any questions you have with your health care provider. ?Document Revised: 12/28/2020 Document Reviewed: 12/28/2020 ?Elsevier Patient Education ? 2023 [employer] ? ?

## 2022-05-30 NOTE — Progress Notes (Signed)
05/30/2022 1:46 PM   [name] [birth year] [id number]  Referring provider: [name], MD [place],  [place] [address]  Followup Urinary frequency and Weak urinary stream   HPI: Julian Fowler is a 87yo here for followup for urinary frequency and weak urinary stream. He is currently on uroxatral. He has urinary frequency every 30 minutes after he takes his lasix. He has nocturia 1-2x. Urine stream strong. He has tried anticholinerigcs, beta3s, and PTNS which has failed to improve his urinary frequency. His complaint today is erectile dysfunction. He has issues getting and maintaining an erections. He can walk to the mailbox and back without getting short of breath. He does not walk up stairs because of a fall.    PMH: Past Medical History:  Diagnosis Date   Allergic rhinitis    Aortic stenosis    Arthritis    Atrial fibrillation (HCC)    BPH (benign prostatic hyperplasia)    Coronary artery calcification seen on CT scan    Easy bruisability    ED (erectile dysfunction)    Essential hypertension    GERD (gastroesophageal reflux disease)    H/O hiatal hernia    Hearing loss in left ear    History of asbestosis    History of shingles    Hyperlipidemia    Overactive bladder    PSVT (paroxysmal supraventricular tachycardia) (HCC)    Sleep apnea    No longer on CPAP following weight loss   Tinnitus     Surgical History: Past Surgical History:  Procedure Laterality Date   BACK SURGERY  2014   herniated L1, L2   Dr Carloyn Manner   BRAIN SURGERY     CEREBRAL EMBOLIZATION  12/2011   "radiation therapy-did not work"   CHOLECYSTECTOMY  6/98   COLONOSCOPY  09/2009   Dr. Lindalou Hose: normal, internal hemorrhoids    COLONOSCOPY N/A 02/28/2017   Dr. Gala Romney: Hemorrhoids, grade 3, mild diverticulosis.   CRANIECTOMY FOR EXCISION OF ACOUSTIC NEUROMA  3/95   MINOR AMPUTATION OF DIGIT Left 12/31/2018   Procedure: REVISION AMPUTATION OF LEFT INDEX FINGER, IRRIGATION AND DEBRIDEMENT  LEFT INDEX FINGER;  Surgeon: Verner Mould, MD;  Location: [facility];  Service: Orthopedics;  Laterality: Left;   RADIOLOGY WITH ANESTHESIA N/A 07/07/2014   Procedure: EMBOLIZATION;  Surgeon: Rob Hickman, MD;  Location: [facility];  Service: Radiology;  Laterality: N/A;   TOTAL KNEE ARTHROPLASTY Left 07/06/2013   Procedure: LEFT TOTAL KNEE ARTHROPLASTY;  Surgeon: Gearlean Alf, MD;  Location: [facility];  Service: Orthopedics;  Laterality: Left;   TOTAL KNEE ARTHROPLASTY Right 01/18/2014   Procedure: RIGHT TOTAL KNEE ARTHROPLASTY;  Surgeon: Gearlean Alf, MD;  Location: [facility];  Service: Orthopedics;  Laterality: Right;   TRIGGER FINGER RELEASE  2003   (thumb) middle finger (2006)    Home Medications:  Allergies as of 05/30/2022       Reactions   Bactrim [sulfamethoxazole-trimethoprim]    Prednisone    Pt is high functioning and out of sorts.   Codeine Other (See Comments)   REACTION: groggy   Tape Other (See Comments)   Skin tears, use paper tape        Medication List        Accurate as of May 30, 2022  1:46 PM. If you have any questions, ask your nurse or doctor.          acetaminophen 325 MG tablet Commonly known as: TYLENOL Take 325  mg by mouth as needed.   alfuzosin 10 MG 24 hr tablet Commonly known as: UROXATRAL Take 1 tablet (10 mg total) by mouth at bedtime.   azelastine 0.1 % nasal spray Commonly known as: ASTELIN 1-2 puffs each nostril every 8 hours if needed for drainage What changed:  when to take this reasons to take this   Benefiber Powd Take 1 packet by mouth 2 (two) times daily.   CENTRUM SILVER PO Take 1 tablet by mouth daily.   diclofenac Sodium 1 % Gel Commonly known as: VOLTAREN Apply 2 g topically daily as needed (For pain).   esomeprazole 40 MG capsule Commonly known as: NEXIUM TAKE 1 CAPSULE BY MOUTH TWICE DAILY BEFORE A MEAL   fexofenadine 180 MG tablet Commonly known as: ALLEGRA Take 180 mg by mouth daily as needed for  allergies.   fluticasone 50 MCG/ACT nasal spray Commonly known as: FLONASE Place 2 sprays into both nostrils daily. What changed:  when to take this reasons to take this   furosemide 20 MG tablet Commonly known as: LASIX Take 1 tablet (20 mg total) by mouth daily.   hydrALAZINE 50 MG tablet Commonly known as: APRESOLINE TAKE 1 & 1/2 (ONE & ONE-HALF) TABLETS BY MOUTH THREE TIMES DAILY   latanoprost 0.005 % ophthalmic solution Commonly known as: XALATAN Place 1 drop into both eyes at bedtime.   LORazepam 1 MG tablet Commonly known as: ATIVAN TAKE 1 TABLET BY MOUTH THREE TIMES DAILY AS NEEDED FOR ANXIETY   losartan 100 MG tablet Commonly known as: COZAAR Take 100 mg by mouth every morning.   metoprolol succinate 25 MG 24 hr tablet Commonly known as: TOPROL-XL Take 25 mg by mouth daily. 1/2 tablet daily   nitrofurantoin 50 MG capsule Commonly known as: MACRODANTIN Take 1 capsule (50 mg total) by mouth at bedtime.   Os-Cal Ultra 600 MG Tabs Take 1 tablet by mouth daily.   polyethylene glycol 17 g packet Commonly known as: MIRALAX / GLYCOLAX Take 17 g by mouth at bedtime.   potassium chloride SA 20 MEQ tablet Commonly known as: KLOR-CON M Take 1 tablet by mouth twice daily   pravastatin 40 MG tablet Commonly known as: PRAVACHOL Take 1 tablet by mouth at bedtime.   ProAir HFA 108 (90 Base) MCG/ACT inhaler Generic drug: albuterol INHALE 2 PUFFS BY MOUTH EVERY 6 HOURS AS NEEDED FOR WHEEZING FOR SHORTNESS OF BREATH What changed: See the new instructions.   sodium chloride 1 g tablet Take 1 g by mouth 3 (three) times daily.   Symbicort 160-4.5 MCG/ACT inhaler Generic drug: budesonide-formoterol INHALE 2 PUFFS BY MOUTH  TWICE DAILY THEN  RINSE  MOUTH   SYSTANE OP Place 1 drop into both eyes daily.   Zinc 25 MG Tabs Take 1 tablet by mouth daily.   zolpidem 10 MG tablet Commonly known as: AMBIEN Take 1 tablet by mouth at bedtime.        Allergies:   Allergies  Allergen Reactions   Bactrim [Sulfamethoxazole-Trimethoprim]    Prednisone     Pt is high functioning and out of sorts.   Codeine Other (See Comments)    REACTION: groggy   Tape Other (See Comments)    Skin tears, use paper tape    Family History: Family History  Problem Relation Age of Onset   Cancer Father        oral cancer   Breast cancer Mother    Heart attack Mother    Hypertension Sister  Bypass x4   Alcohol abuse Sister    Alzheimer's disease Sister    Kidney disease Sister    Colon cancer Neg Hx     Social History:  reports that he quit smoking about 35 years ago. His smoking use included cigarettes. He started smoking about 65 years ago. He has a 45.00 pack-year smoking history. He has never used smokeless tobacco. He reports that he does not drink alcohol and does not use drugs.  ROS: All other review of systems were reviewed and are negative except what is noted above in HPI  Physical Exam: BP (!) 174/89   Pulse 66   Constitutional:  Alert and oriented, No acute distress. HEENT: [place] AT, moist mucus membranes.  Trachea midline, no masses. Cardiovascular: No clubbing, cyanosis, or edema. Respiratory: Normal respiratory effort, no increased work of breathing. GI: Abdomen is soft, nontender, nondistended, no abdominal masses GU: No CVA tenderness.  Lymph: No cervical or inguinal lymphadenopathy. Skin: No rashes, bruises or suspicious lesions. Neurologic: Grossly intact, no focal deficits, moving all 4 extremities. Psychiatric: Normal mood and affect.  Laboratory Data: Lab Results  Component Value Date   WBC 7.1 11/28/2020   HGB 11.3 (L) 11/28/2020   HCT 32.9 (L) 11/28/2020   MCV 90.6 11/28/2020   PLT 450 (H) 11/28/2020    Lab Results  Component Value Date   CREATININE 0.68 11/28/2020    Lab Results  Component Value Date   PSA 3.60 01/06/2009   PSA 3.05 03/25/2007    No results found for: "TESTOSTERONE"  No results found  for: "HGBA1C"  Urinalysis    Component Value Date/Time   COLORURINE STRAW (A) 11/21/2020 1230   APPEARANCEUR Clear 02/26/2022 1155   LABSPEC 1.005 11/21/2020 1230   PHURINE 7.0 11/21/2020 1230   GLUCOSEU Negative 02/26/2022 1155   HGBUR NEGATIVE 11/21/2020 1230   HGBUR negative 10/27/2008 0838   BILIRUBINUR Negative 02/26/2022 1155   KETONESUR NEGATIVE 11/21/2020 1230   PROTEINUR Trace (A) 02/26/2022 1155   PROTEINUR NEGATIVE 11/21/2020 1230   UROBILINOGEN 0.2 01/11/2014 1405   NITRITE Negative 02/26/2022 1155   NITRITE NEGATIVE 11/21/2020 1230   LEUKOCYTESUR 1+ (A) 02/26/2022 1155   LEUKOCYTESUR NEGATIVE 11/21/2020 1230    Lab Results  Component Value Date   LABMICR See below: 02/26/2022   WBCUA 6-10 (A) 02/26/2022   LABEPIT 0-10 02/26/2022   MUCUS Present 02/26/2022   BACTERIA Few 02/26/2022    Pertinent Imaging:  No results found for this or any previous visit.  No results found for this or any previous visit.  No results found for this or any previous visit.  No results found for this or any previous visit.  No results found for this or any previous visit.  No results found for this or any previous visit.  No results found for this or any previous visit.  No results found for this or any previous visit.   Assessment & Plan:    1. Overactive bladder -patient defers therapy at this time  2. Benign prostatic hyperplasia with urinary obstruction -continue uroxatral '10mg'$  qhs - Urinalysis, Routine w reflex microscopic  3. Urinary frequency Continue uroxatral '10mg'$  qhs  4. Erectile dysfunction -[facility] will proceed with tadalafil '20mg'$  prn pending cardiology appointment next week.    No follow-ups on file.  Julian Bang, MD  [facility] [place]

## 2022-05-31 DIAGNOSIS — R293 Abnormal posture: Secondary | ICD-10-CM | POA: Diagnosis not present

## 2022-05-31 DIAGNOSIS — Z9181 History of falling: Secondary | ICD-10-CM | POA: Diagnosis not present

## 2022-05-31 DIAGNOSIS — M6281 Muscle weakness (generalized): Secondary | ICD-10-CM | POA: Diagnosis not present

## 2022-05-31 DIAGNOSIS — M542 Cervicalgia: Secondary | ICD-10-CM | POA: Diagnosis not present

## 2022-05-31 LAB — MICROSCOPIC EXAMINATION

## 2022-05-31 LAB — URINALYSIS, ROUTINE W REFLEX MICROSCOPIC
Bilirubin, UA: NEGATIVE
Glucose, UA: NEGATIVE
Nitrite, UA: NEGATIVE
RBC, UA: NEGATIVE
Specific Gravity, UA: 1.015 (ref 1.005–1.030)
Urobilinogen, Ur: 1 mg/dL (ref 0.2–1.0)
pH, UA: 8.5 — ABNORMAL HIGH (ref 5.0–7.5)

## 2022-06-04 DIAGNOSIS — Z9181 History of falling: Secondary | ICD-10-CM | POA: Diagnosis not present

## 2022-06-04 DIAGNOSIS — R293 Abnormal posture: Secondary | ICD-10-CM | POA: Diagnosis not present

## 2022-06-04 DIAGNOSIS — M542 Cervicalgia: Secondary | ICD-10-CM | POA: Diagnosis not present

## 2022-06-04 DIAGNOSIS — M6281 Muscle weakness (generalized): Secondary | ICD-10-CM | POA: Diagnosis not present

## 2022-06-06 ENCOUNTER — Ambulatory Visit (INDEPENDENT_AMBULATORY_CARE_PROVIDER_SITE_OTHER): Payer: Medicare Other | Admitting: Cardiology

## 2022-06-06 ENCOUNTER — Encounter: Payer: Self-pay | Admitting: Cardiology

## 2022-06-06 VITALS — BP 136/80 | HR 76 | Ht 69.5 in | Wt 248.0 lb

## 2022-06-06 DIAGNOSIS — I4819 Other persistent atrial fibrillation: Secondary | ICD-10-CM | POA: Diagnosis not present

## 2022-06-06 DIAGNOSIS — I2510 Atherosclerotic heart disease of native coronary artery without angina pectoris: Secondary | ICD-10-CM

## 2022-06-06 DIAGNOSIS — I350 Nonrheumatic aortic (valve) stenosis: Secondary | ICD-10-CM

## 2022-06-06 DIAGNOSIS — I2584 Coronary atherosclerosis due to calcified coronary lesion: Secondary | ICD-10-CM

## 2022-06-06 NOTE — Progress Notes (Signed)
Cardiology Office Note  Date: 06/06/2022   ID: Julian Fowler, DOB [birth year], MRN [id number]  PCP:  [name], MD  Cardiologist:  Rozann Lesches, MD Electrophysiologist:  None   Chief Complaint  Patient presents with   Cardiac follow-up    History of Present Illness: Julian Fowler is an 87 y.o. male last seen in February.  He is here today with his wife for a follow-up visit.  He does not report any sense of palpitations and atrial fibrillation.  Main complaint is of recurring headaches since he had a fall earlier this year.  He plans to see a neurologist, is doing physical therapy now to help his balance, brain MRI in June showed no acute findings.  I reviewed his medications which are noted below.  He is in atrial fibrillation with controlled rate by ECG today which I personally reviewed.  He does not report any angina symptoms, stable NYHA class II dyspnea.  Past Medical History:  Diagnosis Date   Allergic rhinitis    Aortic stenosis    Arthritis    Atrial fibrillation (HCC)    BPH (benign prostatic hyperplasia)    Coronary artery calcification seen on CT scan    Easy bruisability    ED (erectile dysfunction)    Essential hypertension    GERD (gastroesophageal reflux disease)    H/O hiatal hernia    Hearing loss in left ear    History of asbestosis    History of shingles    Hyperlipidemia    Overactive bladder    PSVT (paroxysmal supraventricular tachycardia) (HCC)    Sleep apnea    No longer on CPAP following weight loss   Tinnitus     Past Surgical History:  Procedure Laterality Date   BACK SURGERY  2014   herniated L1, L2   Dr Carloyn Manner   BRAIN SURGERY     CEREBRAL EMBOLIZATION  12/2011   "radiation therapy-did not work"   CHOLECYSTECTOMY  6/98   COLONOSCOPY  09/2009   Dr. Lindalou Hose: normal, internal hemorrhoids    COLONOSCOPY N/A 02/28/2017   Dr. Gala Romney: Hemorrhoids, grade 3, mild diverticulosis.   CRANIECTOMY FOR EXCISION OF ACOUSTIC NEUROMA   3/95   MINOR AMPUTATION OF DIGIT Left 12/31/2018   Procedure: REVISION AMPUTATION OF LEFT INDEX FINGER, IRRIGATION AND DEBRIDEMENT LEFT INDEX FINGER;  Surgeon: Verner Mould, MD;  Location: [facility];  Service: Orthopedics;  Laterality: Left;   RADIOLOGY WITH ANESTHESIA N/A 07/07/2014   Procedure: EMBOLIZATION;  Surgeon: Rob Hickman, MD;  Location: [facility];  Service: Radiology;  Laterality: N/A;   TOTAL KNEE ARTHROPLASTY Left 07/06/2013   Procedure: LEFT TOTAL KNEE ARTHROPLASTY;  Surgeon: Gearlean Alf, MD;  Location: [facility];  Service: Orthopedics;  Laterality: Left;   TOTAL KNEE ARTHROPLASTY Right 01/18/2014   Procedure: RIGHT TOTAL KNEE ARTHROPLASTY;  Surgeon: Gearlean Alf, MD;  Location: [facility];  Service: Orthopedics;  Laterality: Right;   TRIGGER FINGER RELEASE  2003   (thumb) middle finger (2006)    Current Outpatient Medications  Medication Sig Dispense Refill   acetaminophen (TYLENOL) 325 MG tablet Take 325 mg by mouth as needed.     alfuzosin (UROXATRAL) 10 MG 24 hr tablet Take 1 tablet (10 mg total) by mouth at bedtime. 30 tablet 11   azelastine (ASTELIN) 0.1 % nasal spray 1-2 puffs each nostril every 8 hours if needed for drainage (Patient taking differently: as needed. 1-2 puffs each nostril every 8 hours  if needed for drainage) 30 mL 12   Calcium Carb-Vit D-C-E-Mineral (OS-CAL ULTRA) 600 MG TABS Take 1 tablet by mouth daily.      diclofenac Sodium (VOLTAREN) 1 % GEL Apply 2 g topically daily as needed (For pain).     esomeprazole (NEXIUM) 40 MG capsule TAKE 1 CAPSULE BY MOUTH TWICE DAILY BEFORE A MEAL 60 capsule 5   fexofenadine (ALLEGRA) 180 MG tablet Take 180 mg by mouth daily as needed for allergies.     fluticasone (FLONASE) 50 MCG/ACT nasal spray Place 2 sprays into both nostrils as needed for allergies or rhinitis.     furosemide (LASIX) 20 MG tablet Take 1 tablet (20 mg total) by mouth daily. 30 tablet 3   hydrALAZINE (APRESOLINE) 50 MG tablet TAKE 1 & 1/2 (ONE &  ONE-HALF) TABLETS BY MOUTH THREE TIMES DAILY 410 tablet 2   latanoprost (XALATAN) 0.005 % ophthalmic solution Place 1 drop into both eyes at bedtime.      LORazepam (ATIVAN) 1 MG tablet TAKE 1 TABLET BY MOUTH THREE TIMES DAILY AS NEEDED FOR ANXIETY 90 tablet 5   losartan (COZAAR) 100 MG tablet Take 100 mg by mouth every morning.      metoprolol succinate (TOPROL-XL) 25 MG 24 hr tablet Take 25 mg by mouth daily. 1/2 tablet daily     Multiple Vitamins-Minerals (CENTRUM SILVER PO) Take 1 tablet by mouth daily.      nitrofurantoin (MACRODANTIN) 50 MG capsule Take 1 capsule (50 mg total) by mouth at bedtime. 30 capsule 11   Polyethyl Glycol-Propyl Glycol (SYSTANE OP) Place 1 drop into both eyes daily.     polyethylene glycol (MIRALAX / GLYCOLAX) packet Take 17 g by mouth at bedtime.     potassium chloride SA (KLOR-CON M) 20 MEQ tablet Take 1 tablet by mouth twice daily 180 tablet 1   pravastatin (PRAVACHOL) 40 MG tablet Take 1 tablet by mouth at bedtime.      PROAIR HFA 108 (90 Base) MCG/ACT inhaler INHALE 2 PUFFS BY MOUTH EVERY 6 HOURS AS NEEDED FOR WHEEZING FOR SHORTNESS OF BREATH (Patient taking differently: Inhale 1 puff into the lungs every 6 (six) hours as needed.) 18 g 12   sodium chloride 1 g tablet Take 1 g by mouth 3 (three) times daily.     SYMBICORT 160-4.5 MCG/ACT inhaler INHALE 2 PUFFS BY MOUTH  TWICE DAILY THEN  RINSE  MOUTH 11 g 6   tadalafil (CIALIS) 20 MG tablet Take 1 tablet (20 mg total) by mouth daily as needed. 10 tablet 5   Wheat Dextrin (BENEFIBER) POWD Take 1 packet by mouth 2 (two) times daily.     Zinc 25 MG TABS Take 1 tablet by mouth daily.     zolpidem (AMBIEN) 10 MG tablet Take 1 tablet by mouth at bedtime.      No current facility-administered medications for this visit.   Allergies:  Bactrim [sulfamethoxazole-trimethoprim], Prednisone, Codeine, and Tape   ROS: No syncope.  Physical Exam: VS:  BP 136/80 (BP Location: Right Arm, Patient Position: Sitting, Cuff  Size: Large)   Pulse 76   Ht 5' 9.5" (1.765 m)   Wt 248 lb (112.5 kg)   SpO2 99%   BMI 36.10 kg/m , BMI Body mass index is 36.1 kg/m.  Wt Readings from Last 3 Encounters:  06/06/22 248 lb (112.5 kg)  02/19/22 247 lb 6.4 oz (112.2 kg)  02/08/22 244 lb 9.6 oz (110.9 kg)    General: Patient appears comfortable at  rest. HEENT: Conjunctiva and lids normal. Neck: Supple, no elevated JVP or carotid bruits, no thyromegaly. Lungs: Clear to auscultation, nonlabored breathing at rest. Cardiac: Irregularly irregular, no S3, 1/6 systolic murmur. Extremities: No pitting edema.  ECG:  An ECG dated 02/01/2021 was personally reviewed today and demonstrated:  Atrial fibrillation.  Recent Labwork:  April 2022: Hemoglobin 12.8, platelets 288, BUN 12, creatinine 0.92, potassium 4.2, AST 25, ALT 21  Other Studies Reviewed Today:  Echocardiogram 11/22/2020:  1. Left ventricular ejection fraction, by estimation, is 60 to 65%. The  left ventricle has normal function. The left ventricle has no regional  wall motion abnormalities. There is moderate left ventricular hypertrophy.  Left ventricular diastolic  parameters are indeterminate.   2. Right ventricular systolic function is normal. The right ventricular  size is normal.   3. The mitral valve is normal in structure. No evidence of mitral valve  regurgitation. No evidence of mitral stenosis.   4. The aortic valve is tricuspid. There is moderate calcification of the  aortic valve. There is moderate thickening of the aortic valve. Aortic  valve regurgitation is not visualized. Mild aortic valve stenosis. Aortic  valve mean gradient measures 10.3  mmHg. Aortic valve peak gradient measures 18.8 mmHg. Aortic valve area, by  VTI measures 1.78 cm.   5. The inferior vena cava is normal in size with greater than 50%  respiratory variability, suggesting right atrial pressure of 3 mmHg.   Assessment and Plan:  1.  Permanent atrial fibrillation with  CHA2DS2-VASc score of 4.  Heart rate is controlled on Toprol-XL.  He is not anticoagulated as per previous discussions.  2.  Asymptomatic, mild aortic stenosis.  No change in cardiac murmur.  Continue to follow.  3.  Coronary artery calcification by CT imaging.  No active angina symptoms.  He is on Pravachol.  Medication Adjustments/Labs and Tests Ordered: Current medicines are reviewed at length with the patient today.  Concerns regarding medicines are outlined above.   Tests Ordered: Orders Placed This Encounter  Procedures   EKG 12-Lead    Medication Changes: No orders of the defined types were placed in this encounter.   Disposition:  Follow up  6 months.  Signed, Satira Sark, MD, [facility] 06/06/2022 2:27 PM    [facility], [place], [place] [address] Phone: [phone]; Fax: [phone]

## 2022-06-06 NOTE — Patient Instructions (Addendum)

## 2022-06-07 DIAGNOSIS — Z9181 History of falling: Secondary | ICD-10-CM | POA: Diagnosis not present

## 2022-06-07 DIAGNOSIS — R293 Abnormal posture: Secondary | ICD-10-CM | POA: Diagnosis not present

## 2022-06-07 DIAGNOSIS — M542 Cervicalgia: Secondary | ICD-10-CM | POA: Diagnosis not present

## 2022-06-07 DIAGNOSIS — M6281 Muscle weakness (generalized): Secondary | ICD-10-CM | POA: Diagnosis not present

## 2022-06-13 DIAGNOSIS — G4440 Drug-induced headache, not elsewhere classified, not intractable: Secondary | ICD-10-CM | POA: Diagnosis not present

## 2022-06-13 DIAGNOSIS — G44321 Chronic post-traumatic headache, intractable: Secondary | ICD-10-CM | POA: Diagnosis not present

## 2022-06-13 DIAGNOSIS — G4733 Obstructive sleep apnea (adult) (pediatric): Secondary | ICD-10-CM | POA: Diagnosis not present

## 2022-06-21 DIAGNOSIS — I739 Peripheral vascular disease, unspecified: Secondary | ICD-10-CM | POA: Diagnosis not present

## 2022-06-21 DIAGNOSIS — M79675 Pain in left toe(s): Secondary | ICD-10-CM | POA: Diagnosis not present

## 2022-06-21 DIAGNOSIS — M79671 Pain in right foot: Secondary | ICD-10-CM | POA: Diagnosis not present

## 2022-06-21 DIAGNOSIS — M79672 Pain in left foot: Secondary | ICD-10-CM | POA: Diagnosis not present

## 2022-06-21 DIAGNOSIS — M79674 Pain in right toe(s): Secondary | ICD-10-CM | POA: Diagnosis not present

## 2022-06-21 DIAGNOSIS — L110 Acquired keratosis follicularis: Secondary | ICD-10-CM | POA: Diagnosis not present

## 2022-06-25 DIAGNOSIS — R519 Headache, unspecified: Secondary | ICD-10-CM | POA: Diagnosis not present

## 2022-06-25 DIAGNOSIS — E871 Hypo-osmolality and hyponatremia: Secondary | ICD-10-CM | POA: Diagnosis not present

## 2022-06-25 DIAGNOSIS — I10 Essential (primary) hypertension: Secondary | ICD-10-CM | POA: Diagnosis not present

## 2022-06-25 DIAGNOSIS — R2681 Unsteadiness on feet: Secondary | ICD-10-CM | POA: Diagnosis not present

## 2022-06-25 DIAGNOSIS — I4819 Other persistent atrial fibrillation: Secondary | ICD-10-CM | POA: Diagnosis not present

## 2022-06-25 DIAGNOSIS — M48061 Spinal stenosis, lumbar region without neurogenic claudication: Secondary | ICD-10-CM | POA: Diagnosis not present

## 2022-06-25 DIAGNOSIS — Z6836 Body mass index (BMI) 36.0-36.9, adult: Secondary | ICD-10-CM | POA: Diagnosis not present

## 2022-06-25 DIAGNOSIS — E7849 Other hyperlipidemia: Secondary | ICD-10-CM | POA: Diagnosis not present

## 2022-06-26 ENCOUNTER — Other Ambulatory Visit: Payer: Self-pay | Admitting: Family Medicine

## 2022-06-26 DIAGNOSIS — M542 Cervicalgia: Secondary | ICD-10-CM

## 2022-06-26 DIAGNOSIS — R2681 Unsteadiness on feet: Secondary | ICD-10-CM

## 2022-06-26 DIAGNOSIS — R519 Headache, unspecified: Secondary | ICD-10-CM

## 2022-06-26 DIAGNOSIS — M5030 Other cervical disc degeneration, unspecified cervical region: Secondary | ICD-10-CM

## 2022-07-08 ENCOUNTER — Ambulatory Visit
Admission: RE | Admit: 2022-07-08 | Discharge: 2022-07-08 | Disposition: A | Discharge status: Home / Self Care | Payer: Medicare Other | Source: Ambulatory Visit | Attending: Family Medicine | Admitting: Family Medicine

## 2022-07-08 DIAGNOSIS — R2681 Unsteadiness on feet: Secondary | ICD-10-CM

## 2022-07-08 DIAGNOSIS — M4802 Spinal stenosis, cervical region: Secondary | ICD-10-CM | POA: Diagnosis not present

## 2022-07-08 DIAGNOSIS — M5030 Other cervical disc degeneration, unspecified cervical region: Secondary | ICD-10-CM

## 2022-07-08 DIAGNOSIS — R519 Headache, unspecified: Secondary | ICD-10-CM

## 2022-07-08 DIAGNOSIS — M542 Cervicalgia: Secondary | ICD-10-CM

## 2022-07-09 ENCOUNTER — Ambulatory Visit
Admission: RE | Admit: 2022-07-09 | Discharge: 2022-07-09 | Disposition: A | Discharge status: Home / Self Care | Payer: Medicare Other | Source: Ambulatory Visit | Attending: Family Medicine | Admitting: Family Medicine

## 2022-07-09 DIAGNOSIS — S0993XA Unspecified injury of face, initial encounter: Secondary | ICD-10-CM | POA: Diagnosis not present

## 2022-07-09 DIAGNOSIS — Z86018 Personal history of other benign neoplasm: Secondary | ICD-10-CM | POA: Diagnosis not present

## 2022-07-09 DIAGNOSIS — R2681 Unsteadiness on feet: Secondary | ICD-10-CM

## 2022-07-09 DIAGNOSIS — Z9889 Other specified postprocedural states: Secondary | ICD-10-CM | POA: Diagnosis not present

## 2022-07-09 DIAGNOSIS — R519 Headache, unspecified: Secondary | ICD-10-CM

## 2022-07-09 DIAGNOSIS — M542 Cervicalgia: Secondary | ICD-10-CM

## 2022-07-09 DIAGNOSIS — J3489 Other specified disorders of nose and nasal sinuses: Secondary | ICD-10-CM | POA: Diagnosis not present

## 2022-07-14 DIAGNOSIS — Z23 Encounter for immunization: Secondary | ICD-10-CM | POA: Diagnosis not present

## 2022-07-14 DIAGNOSIS — E782 Mixed hyperlipidemia: Secondary | ICD-10-CM | POA: Diagnosis not present

## 2022-07-14 DIAGNOSIS — I10 Essential (primary) hypertension: Secondary | ICD-10-CM | POA: Diagnosis not present

## 2022-07-14 DIAGNOSIS — M47812 Spondylosis without myelopathy or radiculopathy, cervical region: Secondary | ICD-10-CM | POA: Diagnosis not present

## 2022-08-08 DIAGNOSIS — M4802 Spinal stenosis, cervical region: Secondary | ICD-10-CM | POA: Diagnosis not present

## 2022-08-08 NOTE — Progress Notes (Unsigned)
HPI male former smoker followed for allergic rhinitis, asthma,  history OSA, asbestos exposure/plaques, chronic insomnia Office spirometry 05/03/15- WNL- FVC 3.33/ 78%, FEV1 2.49/ 78%, r 0.75, FEF25-75% 1.92/ 69%.. -----------------------------------------------------------------------------------------------  02/08/22-  87 year old male Former Smoker followed for allergic Rhinitis, Asthma,  history OSA/ weight loss, Asbestos exposure/plaques/ interstitial lung disease, chronic insomnia, Diverticulitis, SVT, AFib, HTN, GERD, Hyponatremia, -Azelastine nasal, Symbicort 160, Flonase, ProAir hfa,  Covid vax - 1 Phizer, 2 Moderna Flu vax-had Body weight today-244 lbs Here with wife.  He feels his breathing and general health has been stable.  Has had some falls which have been evaluated and discussed.  We reviewed CT report pertinent to lung and pleura from last imaging.   CT chest abd pelvis 11/21/20- Lungs/Pleura: Bilateral calcified pleural plaques, likely sequela of prior asbestos exposure. No focal consolidation, pleural effusion, or pneumothorax. The central airways are patent IMPRESSION: 1. Several nondisplaced left anterior rib fractures. No pneumothorax. 2. No acute/traumatic intra-abdominal or pelvic pathology. 3. Aortic Atherosclerosis (ICD10-I70.0).  08/09/22- 87 year old male Former Smoker followed for allergic Rhinitis, Asthma,  history OSA/ weight loss, Asbestos exposure/plaques/ interstitial lung disease, chronic insomnia, Diverticulitis, SVT, AFib, HTN, GERD, Hyponatremia, Headache,  -Azelastine nasal, Symbicort 160, Flonase, ProAir hfa,  Covid vax - 1 Phizer, 2 Moderna Flu vax-had Body weight today-    ROS See HPI  + = positive Constitutional:   No-   weight loss, night sweats, fevers, chills, fatigue, lassitude. HEENT:   + headaches,  No -difficulty swallowing, tooth/dental problems, sore throat,       No-  sneezing, itching, ear ache,  +nasal congestion, +post nasal drip,   CV:  + chest pain, orthopnea, PND, swelling in lower extremities, no-anasarca, dizziness, palpitations Resp: No-   shortness of breath with exertion or at rest.                productive cough,  No non-productive cough,  No-  coughing up of blood.              No-   change in color of mucus.  Occ wheezing.   Skin: No-   rash or lesions. GI:  No-   heartburn, indigestion, abdominal pain, nausea, vomiting,             GU:  MS:  + joint pain or swelling. .  + back pain. Neuro-  nothing unusual  Psych:  No- change in mood or affect. No depression or anxiety.  No memory loss.  OBJ  General- Alert, Oriented, Affect-appropriate, Distress- none acute, + obese,  Skin- rash-none, lesions- none, excoriation- none,  Lymphadenopathy- none Head- atraumatic            Eyes- Gross vision intact, PERRLA, conjunctivae clear secretions            Ears- +hard of hearing/hearing aid            Nose- + turbinate edema, no-Septal dev, mucus, polyps, erosion, perforation             Throat- Mallampati II , mucosa clear , drainage- none, tonsils- atrophic. Dental repair. Neck- flexible , trachea midline, no stridor , thyroid nl, carotid no bruit Chest - symmetrical excursion , unlabored           Heart/CV-  RRR (hx AFib) , +2/6 S murmur , no gallop  , no rub, nl s1 s2                           -  JVD- none , edema + chronic right lower leg, varices- none           Lungs-  clear,  dullness-none, rub- none.            Chest wall- no rub heard Abd-  Br/ Gen/ Rectal- Not done, not indicated Extrem-  Neuro- grossly intact to observation. Speech is clear.

## 2022-08-09 ENCOUNTER — Ambulatory Visit (INDEPENDENT_AMBULATORY_CARE_PROVIDER_SITE_OTHER): Payer: Medicare Other

## 2022-08-09 ENCOUNTER — Encounter: Payer: Self-pay | Admitting: Internal Medicine

## 2022-08-09 ENCOUNTER — Ambulatory Visit (INDEPENDENT_AMBULATORY_CARE_PROVIDER_SITE_OTHER): Payer: Medicare Other | Admitting: Internal Medicine

## 2022-08-09 VITALS — BP 130/64 | HR 56 | Ht 69.0 in | Wt 243.4 lb

## 2022-08-09 DIAGNOSIS — J929 Pleural plaque without asbestos: Secondary | ICD-10-CM | POA: Diagnosis not present

## 2022-08-09 DIAGNOSIS — J40 Bronchitis, not specified as acute or chronic: Secondary | ICD-10-CM | POA: Diagnosis not present

## 2022-08-09 DIAGNOSIS — I2510 Atherosclerotic heart disease of native coronary artery without angina pectoris: Secondary | ICD-10-CM | POA: Diagnosis not present

## 2022-08-09 DIAGNOSIS — J849 Interstitial pulmonary disease, unspecified: Secondary | ICD-10-CM

## 2022-08-09 DIAGNOSIS — J61 Pneumoconiosis due to asbestos and other mineral fibers: Secondary | ICD-10-CM

## 2022-08-09 DIAGNOSIS — I2584 Coronary atherosclerosis due to calcified coronary lesion: Secondary | ICD-10-CM

## 2022-08-09 DIAGNOSIS — I700 Atherosclerosis of aorta: Secondary | ICD-10-CM | POA: Diagnosis not present

## 2022-08-09 NOTE — Assessment & Plan Note (Signed)
Watching over time

## 2022-08-09 NOTE — Assessment & Plan Note (Signed)
No obvious acute change. Plan- CXR

## 2022-08-09 NOTE — Assessment & Plan Note (Signed)
Recent chest congestion might be fluid overload, but seasonal changes more likely. Plan- continue inhalers, CXR

## 2022-08-09 NOTE — Patient Instructions (Signed)
Order- CXR   dx Asbestosis  Please call if we can help

## 2022-08-13 ENCOUNTER — Telehealth: Payer: Self-pay | Admitting: Internal Medicine

## 2022-08-14 NOTE — Telephone Encounter (Signed)
Addressed in results phone call. Nothing further needed at this time.

## 2022-08-21 DIAGNOSIS — I10 Essential (primary) hypertension: Secondary | ICD-10-CM | POA: Diagnosis not present

## 2022-08-21 DIAGNOSIS — Z1329 Encounter for screening for other suspected endocrine disorder: Secondary | ICD-10-CM | POA: Diagnosis not present

## 2022-08-22 DIAGNOSIS — I10 Essential (primary) hypertension: Secondary | ICD-10-CM | POA: Diagnosis not present

## 2022-08-22 DIAGNOSIS — Z1329 Encounter for screening for other suspected endocrine disorder: Secondary | ICD-10-CM | POA: Diagnosis not present

## 2022-08-23 ENCOUNTER — Encounter: Payer: Self-pay | Admitting: Internal Medicine

## 2022-08-28 ENCOUNTER — Other Ambulatory Visit: Payer: Self-pay | Admitting: Cardiology

## 2022-08-28 DIAGNOSIS — R2681 Unsteadiness on feet: Secondary | ICD-10-CM | POA: Diagnosis not present

## 2022-08-28 DIAGNOSIS — R519 Headache, unspecified: Secondary | ICD-10-CM | POA: Diagnosis not present

## 2022-08-28 DIAGNOSIS — E871 Hypo-osmolality and hyponatremia: Secondary | ICD-10-CM | POA: Diagnosis not present

## 2022-08-28 DIAGNOSIS — F0781 Postconcussional syndrome: Secondary | ICD-10-CM | POA: Diagnosis not present

## 2022-08-28 DIAGNOSIS — M4802 Spinal stenosis, cervical region: Secondary | ICD-10-CM | POA: Diagnosis not present

## 2022-08-28 DIAGNOSIS — Z6837 Body mass index (BMI) 37.0-37.9, adult: Secondary | ICD-10-CM | POA: Diagnosis not present

## 2022-08-28 DIAGNOSIS — I4819 Other persistent atrial fibrillation: Secondary | ICD-10-CM | POA: Diagnosis not present

## 2022-08-28 DIAGNOSIS — E7849 Other hyperlipidemia: Secondary | ICD-10-CM | POA: Diagnosis not present

## 2022-08-28 DIAGNOSIS — M48061 Spinal stenosis, lumbar region without neurogenic claudication: Secondary | ICD-10-CM | POA: Diagnosis not present

## 2022-08-28 DIAGNOSIS — R030 Elevated blood-pressure reading, without diagnosis of hypertension: Secondary | ICD-10-CM | POA: Diagnosis not present

## 2022-09-01 ENCOUNTER — Other Ambulatory Visit: Payer: Self-pay | Admitting: Internal Medicine

## 2022-09-03 DIAGNOSIS — L110 Acquired keratosis follicularis: Secondary | ICD-10-CM | POA: Diagnosis not present

## 2022-09-03 DIAGNOSIS — M79674 Pain in right toe(s): Secondary | ICD-10-CM | POA: Diagnosis not present

## 2022-09-03 DIAGNOSIS — M79672 Pain in left foot: Secondary | ICD-10-CM | POA: Diagnosis not present

## 2022-09-03 DIAGNOSIS — I739 Peripheral vascular disease, unspecified: Secondary | ICD-10-CM | POA: Diagnosis not present

## 2022-09-03 DIAGNOSIS — M79671 Pain in right foot: Secondary | ICD-10-CM | POA: Diagnosis not present

## 2022-09-03 DIAGNOSIS — M79675 Pain in left toe(s): Secondary | ICD-10-CM | POA: Diagnosis not present

## 2022-09-13 DIAGNOSIS — I10 Essential (primary) hypertension: Secondary | ICD-10-CM | POA: Diagnosis not present

## 2022-09-13 DIAGNOSIS — M47812 Spondylosis without myelopathy or radiculopathy, cervical region: Secondary | ICD-10-CM | POA: Diagnosis not present

## 2022-09-13 DIAGNOSIS — E782 Mixed hyperlipidemia: Secondary | ICD-10-CM | POA: Diagnosis not present

## 2022-09-17 ENCOUNTER — Other Ambulatory Visit: Payer: Self-pay | Admitting: *Deleted

## 2022-09-17 MED ORDER — HYDRALAZINE HCL 50 MG PO TABS: 75.0000 mg | ORAL_TABLET | Freq: Three times a day (TID) | ORAL | 1 refills | Status: DC

## 2022-10-03 NOTE — Progress Notes (Deleted)
NEUROLOGY FOLLOW UP OFFICE NOTE  [name] [id number]  Assessment/Plan:   ***  Subjective:  Julian Fowler is an 87 year old ***.  MRI of brain and cervical spine personally reviewed.  He was experiencing several falls back in February and March, thought to be secondary to hyponatremia.  He had one particular fall in March in which ***.  Since then, ***.  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  MRI of brain without contrast on 03/27/2022 showed mild generalized cerebral volume loss and chronic small vessel ischemic changes as well as chronic left mastoidectomy in the left cerebellopontine angle without recurrence of tumor.  MRI of cervical pine on 07/08/2022 revealed diffuse cervical spondylosis with severe foraminal stenosis at left C2-3, bilateral C3-4 and moderate-severe foraminal stenosis at left C4-5 and bilateral C5-6 but only mild spinal canal stenosis at C4-5 and C5-6.  He saw neurology on 06/13/2022, who prescribed a steroid taper which was ineffective.    PAST MEDICAL HISTORY: Past Medical History:  Diagnosis Date   Allergic rhinitis    Aortic stenosis    Arthritis    Atrial fibrillation (HCC)    BPH (benign prostatic hyperplasia)    Coronary artery calcification seen on CT scan    Easy bruisability    ED (erectile dysfunction)    Essential hypertension    GERD (gastroesophageal reflux disease)    H/O hiatal hernia    Hearing loss in left ear    History of asbestosis    History of shingles    Hyperlipidemia    Overactive bladder    PSVT (paroxysmal supraventricular tachycardia)    Sleep apnea    No longer on CPAP following weight loss   Tinnitus     MEDICATIONS: Current Outpatient Medications on File Prior to Visit  Medication Sig Dispense Refill   acetaminophen (TYLENOL) 325 MG tablet Take 325 mg by mouth as needed.     alfuzosin (UROXATRAL) 10 MG 24 hr tablet Take 1 tablet (10 mg  total) by mouth at bedtime. 30 tablet 11   azelastine (ASTELIN) 0.1 % nasal spray 1-2 puffs each nostril every 8 hours if needed for drainage (Patient taking differently: as needed. 1-2 puffs each nostril every 8 hours if needed for drainage) 30 mL 12   Calcium Carb-Vit D-C-E-Mineral (OS-CAL ULTRA) 600 MG TABS Take 1 tablet by mouth daily.      diclofenac Sodium (VOLTAREN) 1 % GEL Apply 2 g topically daily as needed (For pain).     esomeprazole (NEXIUM) 40 MG capsule TAKE 1 CAPSULE BY MOUTH TWICE DAILY BEFORE A MEAL 60 capsule 3   fexofenadine (ALLEGRA) 180 MG tablet Take 180 mg by mouth daily as needed for allergies.     fluticasone (FLONASE) 50 MCG/ACT nasal spray Place 2 sprays into both nostrils as needed for allergies or rhinitis.     furosemide (LASIX) 20 MG tablet Take 1 tablet (20 mg total) by mouth daily. 30 tablet 3   hydrALAZINE (APRESOLINE) 50 MG tablet Take 1.5 tablets (75 mg total) by mouth 3 (three) times daily. 405 tablet 1   latanoprost (XALATAN) 0.005 % ophthalmic solution Place 1 drop into both eyes at bedtime.      LORazepam (ATIVAN) 1 MG tablet TAKE 1 TABLET BY MOUTH THREE TIMES DAILY AS NEEDED FOR ANXIETY 90 tablet 5   losartan (COZAAR) 100 MG tablet Take 100 mg by mouth every morning.  metoprolol succinate (TOPROL-XL) 25 MG 24 hr tablet Take 25 mg by mouth daily. 1/2 tablet daily     Multiple Vitamins-Minerals (CENTRUM SILVER PO) Take 1 tablet by mouth daily.      nitrofurantoin (MACRODANTIN) 50 MG capsule Take 1 capsule (50 mg total) by mouth at bedtime. 30 capsule 11   Polyethyl Glycol-Propyl Glycol (SYSTANE OP) Place 1 drop into both eyes daily.     polyethylene glycol (MIRALAX / GLYCOLAX) packet Take 17 g by mouth at bedtime.     potassium chloride SA (KLOR-CON M20) 20 MEQ tablet Take 1 tablet by mouth twice daily 180 tablet 1   pravastatin (PRAVACHOL) 40 MG tablet Take 1 tablet by mouth at bedtime.      PROAIR HFA 108 (90 Base) MCG/ACT inhaler INHALE 2 PUFFS BY  MOUTH EVERY 6 HOURS AS NEEDED FOR WHEEZING FOR SHORTNESS OF BREATH (Patient taking differently: Inhale 1 puff into the lungs every 6 (six) hours as needed.) 18 g 12   sodium chloride 1 g tablet Take 1 g by mouth 3 (three) times daily.     SYMBICORT 160-4.5 MCG/ACT inhaler INHALE 2 PUFFS BY MOUTH  TWICE DAILY THEN  RINSE  MOUTH 11 g 6   tadalafil (CIALIS) 20 MG tablet Take 1 tablet (20 mg total) by mouth daily as needed. 10 tablet 5   Wheat Dextrin (BENEFIBER) POWD Take 1 packet by mouth 2 (two) times daily.     Zinc 25 MG TABS Take 1 tablet by mouth daily.     zolpidem (AMBIEN) 10 MG tablet Take 1 tablet by mouth at bedtime.      No current facility-administered medications on file prior to visit.    ALLERGIES: Allergies  Allergen Reactions   Bactrim [Sulfamethoxazole-Trimethoprim]    Prednisone     Pt is high functioning and out of sorts.   Codeine Other (See Comments)    REACTION: groggy   Tape Other (See Comments)    Skin tears, use paper tape    FAMILY HISTORY: Family History  Problem Relation Age of Onset   Cancer Father        oral cancer   Breast cancer Mother    Heart attack Mother    Hypertension Sister        Bypass x4   Alcohol abuse Sister    Alzheimer's disease Sister    Kidney disease Sister    Colon cancer Neg Hx       Objective:  *** General: No acute distress.  Patient appears ***-groomed.   Head:  Normocephalic/atraumatic Eyes:  Fundi examined but not visualized Neck: supple, no paraspinal tenderness, full range of motion Heart:  Regular rate and rhythm Lungs:  Clear to auscultation bilaterally Back: No paraspinal tenderness Neurological Exam: alert and oriented to person, place, and time.  Speech fluent and not dysarthric, language intact.  CN II-XII intact. Bulk and tone normal, muscle strength 5/5 throughout.  Sensation to light touch intact.  Deep tendon reflexes 2+ throughout, toes downgoing.  Finger to nose testing intact.  Gait normal, Romberg  negative.   Metta Clines, DO  CC: ***

## 2022-10-09 ENCOUNTER — Ambulatory Visit: Payer: Medicare Other | Admitting: Neurology

## 2022-10-24 ENCOUNTER — Other Ambulatory Visit: Payer: Self-pay | Admitting: Internal Medicine

## 2022-10-24 NOTE — Telephone Encounter (Signed)
Lorazepam refilled

## 2022-10-24 NOTE — Telephone Encounter (Signed)
Please advise on refill request   Allergies  Allergen Reactions   Bactrim [Sulfamethoxazole-Trimethoprim]    Prednisone     Pt is high functioning and out of sorts.   Codeine Other (See Comments)    REACTION: groggy   Tape Other (See Comments)    Skin tears, use paper tape     Current Outpatient Medications:    acetaminophen (TYLENOL) 325 MG tablet, Take 325 mg by mouth as needed., Disp: , Rfl:    alfuzosin (UROXATRAL) 10 MG 24 hr tablet, Take 1 tablet (10 mg total) by mouth at bedtime., Disp: 30 tablet, Rfl: 11   azelastine (ASTELIN) 0.1 % nasal spray, 1-2 puffs each nostril every 8 hours if needed for drainage (Patient taking differently: as needed. 1-2 puffs each nostril every 8 hours if needed for drainage), Disp: 30 mL, Rfl: 12   Calcium Carb-Vit D-C-E-Mineral (OS-CAL ULTRA) 600 MG TABS, Take 1 tablet by mouth daily. , Disp: , Rfl:    diclofenac Sodium (VOLTAREN) 1 % GEL, Apply 2 g topically daily as needed (For pain)., Disp: , Rfl:    esomeprazole (NEXIUM) 40 MG capsule, TAKE 1 CAPSULE BY MOUTH TWICE DAILY BEFORE A MEAL, Disp: 60 capsule, Rfl: 3   fexofenadine (ALLEGRA) 180 MG tablet, Take 180 mg by mouth daily as needed for allergies., Disp: , Rfl:    fluticasone (FLONASE) 50 MCG/ACT nasal spray, Place 2 sprays into both nostrils as needed for allergies or rhinitis., Disp: , Rfl:    furosemide (LASIX) 20 MG tablet, Take 1 tablet (20 mg total) by mouth daily., Disp: 30 tablet, Rfl: 3   hydrALAZINE (APRESOLINE) 50 MG tablet, Take 1.5 tablets (75 mg total) by mouth 3 (three) times daily., Disp: 405 tablet, Rfl: 1   latanoprost (XALATAN) 0.005 % ophthalmic solution, Place 1 drop into both eyes at bedtime. , Disp: , Rfl:    LORazepam (ATIVAN) 1 MG tablet, TAKE 1 TABLET BY MOUTH THREE TIMES DAILY AS NEEDED FOR ANXIETY, Disp: 90 tablet, Rfl: 5   losartan (COZAAR) 100 MG tablet, Take 100 mg by mouth every morning. , Disp: , Rfl:    metoprolol succinate (TOPROL-XL) 25 MG 24 hr tablet, Take  25 mg by mouth daily. 1/2 tablet daily, Disp: , Rfl:    Multiple Vitamins-Minerals (CENTRUM SILVER PO), Take 1 tablet by mouth daily. , Disp: , Rfl:    nitrofurantoin (MACRODANTIN) 50 MG capsule, Take 1 capsule (50 mg total) by mouth at bedtime., Disp: 30 capsule, Rfl: 11   Polyethyl Glycol-Propyl Glycol (SYSTANE OP), Place 1 drop into both eyes daily., Disp: , Rfl:    polyethylene glycol (MIRALAX / GLYCOLAX) packet, Take 17 g by mouth at bedtime., Disp: , Rfl:    potassium chloride SA (KLOR-CON M20) 20 MEQ tablet, Take 1 tablet by mouth twice daily, Disp: 180 tablet, Rfl: 1   pravastatin (PRAVACHOL) 40 MG tablet, Take 1 tablet by mouth at bedtime. , Disp: , Rfl:    PROAIR HFA 108 (90 Base) MCG/ACT inhaler, INHALE 2 PUFFS BY MOUTH EVERY 6 HOURS AS NEEDED FOR WHEEZING FOR SHORTNESS OF BREATH (Patient taking differently: Inhale 1 puff into the lungs every 6 (six) hours as needed.), Disp: 18 g, Rfl: 12   sodium chloride 1 g tablet, Take 1 g by mouth 3 (three) times daily., Disp: , Rfl:    SYMBICORT 160-4.5 MCG/ACT inhaler, INHALE 2 PUFFS BY MOUTH  TWICE DAILY THEN  RINSE  MOUTH, Disp: 11 g, Rfl: 6   tadalafil (CIALIS) 20  MG tablet, Take 1 tablet (20 mg total) by mouth daily as needed., Disp: 10 tablet, Rfl: 5   Wheat Dextrin (BENEFIBER) POWD, Take 1 packet by mouth 2 (two) times daily., Disp: , Rfl:    Zinc 25 MG TABS, Take 1 tablet by mouth daily., Disp: , Rfl:    zolpidem (AMBIEN) 10 MG tablet, Take 1 tablet by mouth at bedtime. , Disp: , Rfl:

## 2022-10-27 ENCOUNTER — Other Ambulatory Visit: Payer: Self-pay | Admitting: Urology

## 2022-10-27 DIAGNOSIS — N390 Urinary tract infection, site not specified: Secondary | ICD-10-CM

## 2022-11-08 DIAGNOSIS — Z6835 Body mass index (BMI) 35.0-35.9, adult: Secondary | ICD-10-CM | POA: Diagnosis not present

## 2022-11-08 DIAGNOSIS — M4802 Spinal stenosis, cervical region: Secondary | ICD-10-CM | POA: Diagnosis not present

## 2022-11-08 DIAGNOSIS — M5416 Radiculopathy, lumbar region: Secondary | ICD-10-CM | POA: Diagnosis not present

## 2022-11-12 DIAGNOSIS — M79674 Pain in right toe(s): Secondary | ICD-10-CM | POA: Diagnosis not present

## 2022-11-12 DIAGNOSIS — M79671 Pain in right foot: Secondary | ICD-10-CM | POA: Diagnosis not present

## 2022-11-12 DIAGNOSIS — L110 Acquired keratosis follicularis: Secondary | ICD-10-CM | POA: Diagnosis not present

## 2022-11-12 DIAGNOSIS — M79672 Pain in left foot: Secondary | ICD-10-CM | POA: Diagnosis not present

## 2022-11-12 DIAGNOSIS — I739 Peripheral vascular disease, unspecified: Secondary | ICD-10-CM | POA: Diagnosis not present

## 2022-11-12 DIAGNOSIS — M79675 Pain in left toe(s): Secondary | ICD-10-CM | POA: Diagnosis not present

## 2022-11-20 DIAGNOSIS — D239 Other benign neoplasm of skin, unspecified: Secondary | ICD-10-CM | POA: Diagnosis not present

## 2022-11-20 DIAGNOSIS — L570 Actinic keratosis: Secondary | ICD-10-CM | POA: Diagnosis not present

## 2022-11-20 DIAGNOSIS — L304 Erythema intertrigo: Secondary | ICD-10-CM | POA: Diagnosis not present

## 2022-11-20 DIAGNOSIS — Z1283 Encounter for screening for malignant neoplasm of skin: Secondary | ICD-10-CM | POA: Diagnosis not present

## 2022-11-22 DIAGNOSIS — R7301 Impaired fasting glucose: Secondary | ICD-10-CM | POA: Diagnosis not present

## 2022-11-22 DIAGNOSIS — E7849 Other hyperlipidemia: Secondary | ICD-10-CM | POA: Diagnosis not present

## 2022-11-22 DIAGNOSIS — E876 Hypokalemia: Secondary | ICD-10-CM | POA: Diagnosis not present

## 2022-11-22 DIAGNOSIS — I10 Essential (primary) hypertension: Secondary | ICD-10-CM | POA: Diagnosis not present

## 2022-11-26 ENCOUNTER — Other Ambulatory Visit: Payer: Self-pay | Admitting: Urology

## 2022-11-26 DIAGNOSIS — N390 Urinary tract infection, site not specified: Secondary | ICD-10-CM

## 2022-11-28 ENCOUNTER — Ambulatory Visit: Payer: Medicare Other | Admitting: Urology

## 2022-11-28 DIAGNOSIS — N3281 Overactive bladder: Secondary | ICD-10-CM

## 2022-11-29 DIAGNOSIS — Z6837 Body mass index (BMI) 37.0-37.9, adult: Secondary | ICD-10-CM | POA: Diagnosis not present

## 2022-11-29 DIAGNOSIS — E871 Hypo-osmolality and hyponatremia: Secondary | ICD-10-CM | POA: Diagnosis not present

## 2022-11-29 DIAGNOSIS — R600 Localized edema: Secondary | ICD-10-CM | POA: Diagnosis not present

## 2022-11-29 DIAGNOSIS — R519 Headache, unspecified: Secondary | ICD-10-CM | POA: Diagnosis not present

## 2022-11-29 DIAGNOSIS — I350 Nonrheumatic aortic (valve) stenosis: Secondary | ICD-10-CM | POA: Diagnosis not present

## 2022-11-29 DIAGNOSIS — R030 Elevated blood-pressure reading, without diagnosis of hypertension: Secondary | ICD-10-CM | POA: Diagnosis not present

## 2022-11-29 DIAGNOSIS — E7849 Other hyperlipidemia: Secondary | ICD-10-CM | POA: Diagnosis not present

## 2022-11-29 DIAGNOSIS — M48061 Spinal stenosis, lumbar region without neurogenic claudication: Secondary | ICD-10-CM | POA: Diagnosis not present

## 2022-11-29 DIAGNOSIS — R2681 Unsteadiness on feet: Secondary | ICD-10-CM | POA: Diagnosis not present

## 2022-11-29 DIAGNOSIS — M4802 Spinal stenosis, cervical region: Secondary | ICD-10-CM | POA: Diagnosis not present

## 2022-11-29 DIAGNOSIS — R110 Nausea: Secondary | ICD-10-CM | POA: Diagnosis not present

## 2022-11-29 DIAGNOSIS — I4819 Other persistent atrial fibrillation: Secondary | ICD-10-CM | POA: Diagnosis not present

## 2022-12-14 ENCOUNTER — Encounter: Payer: Self-pay | Admitting: Cardiology

## 2022-12-14 ENCOUNTER — Encounter: Payer: Self-pay | Admitting: *Deleted

## 2022-12-14 ENCOUNTER — Ambulatory Visit: Payer: Medicare Other | Attending: Cardiology | Admitting: Cardiology

## 2022-12-14 VITALS — BP 142/78 | HR 70 | Ht 69.5 in | Wt 242.0 lb

## 2022-12-14 DIAGNOSIS — I350 Nonrheumatic aortic (valve) stenosis: Secondary | ICD-10-CM

## 2022-12-14 DIAGNOSIS — I2510 Atherosclerotic heart disease of native coronary artery without angina pectoris: Secondary | ICD-10-CM | POA: Diagnosis not present

## 2022-12-14 DIAGNOSIS — I4821 Permanent atrial fibrillation: Secondary | ICD-10-CM

## 2022-12-14 DIAGNOSIS — I2584 Coronary atherosclerosis due to calcified coronary lesion: Secondary | ICD-10-CM | POA: Insufficient documentation

## 2022-12-14 NOTE — Progress Notes (Signed)
    Cardiology Office Note  Date: 12/14/2022   ID: Julian Fowler, DOB 08-01-1935, MRN [id number]  History of Present Illness: Julian Fowler is an 88 y.o. male last seen in August 2023.  He is here with his wife for follow-up visit.  Reports no exertional chest pain or palpitations, no dizziness or syncope.  He does have trouble with his balance and propensity to falls.  He states that he saw Dr. Pleas Koch recently and had lab work, we are requesting the results for review.  I went over his medications, he reports compliance with therapy.  Blood pressure rechecked by me today was 142/78.  He did have an echocardiogram back in February 2022 at which point aortic stenosis was mild, we discussed getting a follow-up on this in about 1 year.  I reviewed his ECG today which shows rate controlled atrial fibrillation.  Physical Exam: VS:  BP (!) 142/78 (BP Location: Left Arm)   Pulse 70   Ht 5' 9.5" (1.765 m)   Wt 242 lb (109.8 kg)   SpO2 97%   BMI 35.22 kg/m , BMI Body mass index is 35.22 kg/m.  Wt Readings from Last 3 Encounters:  12/14/22 242 lb (109.8 kg)  08/09/22 243 lb 6.4 oz (110.4 kg)  06/06/22 248 lb (112.5 kg)    General: Patient appears comfortable at rest. HEENT: Conjunctiva and lids normal. Neck: Supple, no elevated JVP or carotid bruits. Lungs: Clear to auscultation, nonlabored breathing at rest. Cardiac: Irregularly irregular, 1/6 systolic murmur. Extremities: No pitting edema.  ECG:  An ECG dated 06/06/2022 was personally reviewed today and demonstrated:  Atrial fibrillation.  Labwork:  January 2023: Cholesterol 144, triglycerides 97, HDL 47, LDL 79 TSH 1.36 April 2023: Hemoglobin A1c 5.6% November 2023: BUN 8, creatinine 0.89, potassium 5.0, AST 28, ALT 22  Other Studies Reviewed Today:  No interval cardiac testing for review today.  Assessment and Plan:  1.  Permanent atrial fibrillation with CHA2DS2-VASc score of 4.  Asymptomatic and with good heart rate  control on Toprol-XL.  He is not anticoagulated as per prior discussions.  Continue with observation.  2.  Mild degenerative, calcific aortic stenosis with mean gradient 10 mmHg by echocardiogram in February 2022.  Asymptomatic, no significant change in heart murmur.  We will plan on updating an echocardiogram in 1 year.  3.  Coronary artery calcification by CT imaging.  Continue Pravachol.  Disposition:  Follow up  6 months.  Signed, Satira Sark, M.D., F.A.C.C.

## 2022-12-14 NOTE — Patient Instructions (Addendum)

## 2022-12-19 ENCOUNTER — Ambulatory Visit (INDEPENDENT_AMBULATORY_CARE_PROVIDER_SITE_OTHER): Payer: Medicare Other | Admitting: Urology

## 2022-12-19 ENCOUNTER — Encounter: Payer: Self-pay | Admitting: Urology

## 2022-12-19 VITALS — BP 189/94 | HR 79

## 2022-12-19 DIAGNOSIS — N138 Other obstructive and reflux uropathy: Secondary | ICD-10-CM | POA: Diagnosis not present

## 2022-12-19 DIAGNOSIS — Z87440 Personal history of urinary (tract) infections: Secondary | ICD-10-CM

## 2022-12-19 DIAGNOSIS — N390 Urinary tract infection, site not specified: Secondary | ICD-10-CM | POA: Diagnosis not present

## 2022-12-19 DIAGNOSIS — R3912 Poor urinary stream: Secondary | ICD-10-CM | POA: Diagnosis not present

## 2022-12-19 DIAGNOSIS — N3281 Overactive bladder: Secondary | ICD-10-CM | POA: Diagnosis not present

## 2022-12-19 DIAGNOSIS — N5201 Erectile dysfunction due to arterial insufficiency: Secondary | ICD-10-CM

## 2022-12-19 DIAGNOSIS — N401 Enlarged prostate with lower urinary tract symptoms: Secondary | ICD-10-CM | POA: Diagnosis not present

## 2022-12-19 MED ORDER — ALFUZOSIN HCL ER 10 MG PO TB24: 10.0000 mg | ORAL_TABLET | Freq: Two times a day (BID) | ORAL | 11 refills | Status: DC

## 2022-12-19 NOTE — Patient Instructions (Signed)

## 2022-12-19 NOTE — Progress Notes (Signed)
12/19/2022 1:32 PM   Julian Fowler May 11, 1935 [id number]  Referring provider: [name], MD [place],   [id number]  Followup urinary urgency and incontinence   HPI: Julian Fowler is a 88yo here for followup for BPH, urinary incontinence and recurrent UTI. No UTIs since last visit. He takes his lasix at 12pm and starts urinating at 3:30 and continues until 10pm. He has urinary urgency with each afternoon urination. Urine stream strong. No straining to urinate.    PMH: Past Medical History:  Diagnosis Date   Allergic rhinitis    Aortic stenosis    Arthritis    Atrial fibrillation (HCC)    BPH (benign prostatic hyperplasia)    Coronary artery calcification seen on CT scan    Easy bruisability    ED (erectile dysfunction)    Essential hypertension    GERD (gastroesophageal reflux disease)    H/O hiatal hernia    Hearing loss in left ear    History of asbestosis    History of shingles    Hyperlipidemia    Overactive bladder    PSVT (paroxysmal supraventricular tachycardia)    Sleep apnea    No longer on CPAP following weight loss   Tinnitus     Surgical History: Past Surgical History:  Procedure Laterality Date   BACK SURGERY  2014   herniated L1, L2   Dr Carloyn Manner   BRAIN SURGERY     CEREBRAL EMBOLIZATION  12/2011   "radiation therapy-did not work"   CHOLECYSTECTOMY  6/98   COLONOSCOPY  09/2009   Dr. Lindalou Hose: normal, internal hemorrhoids    COLONOSCOPY N/A 02/28/2017   Dr. Gala Romney: Hemorrhoids, grade 3, mild diverticulosis.   CRANIECTOMY FOR EXCISION OF ACOUSTIC NEUROMA  3/95   MINOR AMPUTATION OF DIGIT Left 12/31/2018   Procedure: REVISION AMPUTATION OF LEFT INDEX FINGER, IRRIGATION AND DEBRIDEMENT LEFT INDEX FINGER;  Surgeon: Verner Mould, MD;  Location: [facility];  Service: Orthopedics;  Laterality: Left;   RADIOLOGY WITH ANESTHESIA N/A 07/07/2014   Procedure: EMBOLIZATION;  Surgeon: Rob Hickman, MD;  Location: [facility];  Service:  Radiology;  Laterality: N/A;   TOTAL KNEE ARTHROPLASTY Left 07/06/2013   Procedure: LEFT TOTAL KNEE ARTHROPLASTY;  Surgeon: Gearlean Alf, MD;  Location: [facility];  Service: Orthopedics;  Laterality: Left;   TOTAL KNEE ARTHROPLASTY Right 01/18/2014   Procedure: RIGHT TOTAL KNEE ARTHROPLASTY;  Surgeon: Gearlean Alf, MD;  Location: [facility];  Service: Orthopedics;  Laterality: Right;   TRIGGER FINGER RELEASE  2003   (thumb) middle finger (2006)    Home Medications:  Allergies as of 12/19/2022       Reactions   Bactrim [sulfamethoxazole-trimethoprim]    Prednisone    Pt is high functioning and out of sorts.   Codeine Other (See Comments)   REACTION: groggy   Tape Other (See Comments)   Skin tears, use paper tape        Medication List        Accurate as of December 19, 2022  1:32 PM. If you have any questions, ask your nurse or doctor.          acetaminophen 325 MG tablet Commonly known as: TYLENOL Take 325 mg by mouth as needed.   alfuzosin 10 MG 24 hr tablet Commonly known as: UROXATRAL Take 1 tablet (10 mg total) by mouth at bedtime.   azelastine 0.1 % nasal spray Commonly known as: ASTELIN 1-2 puffs each nostril every 8  hours if needed for drainage What changed:  when to take this reasons to take this   Benefiber Powd Take 1 packet by mouth 2 (two) times daily.   CENTRUM SILVER PO Take 1 tablet by mouth daily.   diclofenac Sodium 1 % Gel Commonly known as: VOLTAREN Apply 2 g topically daily as needed (For pain).   esomeprazole 40 MG capsule Commonly known as: NEXIUM TAKE 1 CAPSULE BY MOUTH TWICE DAILY BEFORE A MEAL   fexofenadine 180 MG tablet Commonly known as: ALLEGRA Take 180 mg by mouth daily as needed for allergies.   fluticasone 50 MCG/ACT nasal spray Commonly known as: FLONASE Place 2 sprays into both nostrils as needed for allergies or rhinitis.   furosemide 20 MG tablet Commonly known as: LASIX Take 1 tablet (20 mg total) by mouth daily.    hydrALAZINE 50 MG tablet Commonly known as: APRESOLINE Take 1.5 tablets (75 mg total) by mouth 3 (three) times daily.   Klor-Con M20 20 MEQ tablet Generic drug: potassium chloride SA Take 1 tablet by mouth twice daily   latanoprost 0.005 % ophthalmic solution Commonly known as: XALATAN Place 1 drop into both eyes at bedtime.   LORazepam 1 MG tablet Commonly known as: ATIVAN TAKE 1 TABLET BY MOUTH THREE TIMES DAILY AS NEEDED FOR ANXIETY   losartan 100 MG tablet Commonly known as: COZAAR Take 100 mg by mouth every morning.   metoprolol succinate 25 MG 24 hr tablet Commonly known as: TOPROL-XL Take 25 mg by mouth daily. 1/2 tablet daily   nitrofurantoin 50 MG capsule Commonly known as: MACRODANTIN Take 1 capsule by mouth at bedtime   Os-Cal Ultra 600 MG Tabs Take 1 tablet by mouth daily.   polyethylene glycol 17 g packet Commonly known as: MIRALAX / GLYCOLAX Take 17 g by mouth at bedtime.   pravastatin 40 MG tablet Commonly known as: PRAVACHOL Take 1 tablet by mouth at bedtime.   ProAir HFA 108 (90 Base) MCG/ACT inhaler Generic drug: albuterol INHALE 2 PUFFS BY MOUTH EVERY 6 HOURS AS NEEDED FOR WHEEZING FOR SHORTNESS OF BREATH What changed: See the new instructions.   sodium chloride 1 g tablet Take 1 g by mouth 3 (three) times daily.   Symbicort 160-4.5 MCG/ACT inhaler Generic drug: budesonide-formoterol INHALE 2 PUFFS BY MOUTH  TWICE DAILY THEN  RINSE  MOUTH   SYSTANE OP Place 1 drop into both eyes daily.   tadalafil 20 MG tablet Commonly known as: CIALIS Take 1 tablet (20 mg total) by mouth daily as needed.   Zinc 25 MG Tabs Take 1 tablet by mouth daily.   zolpidem 10 MG tablet Commonly known as: AMBIEN Take 1 tablet by mouth at bedtime.        Allergies:  Allergies  Allergen Reactions   Bactrim [Sulfamethoxazole-Trimethoprim]    Prednisone     Pt is high functioning and out of sorts.   Codeine Other (See Comments)    REACTION: groggy    Tape Other (See Comments)    Skin tears, use paper tape    Family History: Family History  Problem Relation Age of Onset   Cancer Father        oral cancer   Breast cancer Mother    Heart attack Mother    Hypertension Sister        Bypass x4   Alcohol abuse Sister    Alzheimer's disease Sister    Kidney disease Sister    Colon cancer Neg Hx  Social History:  reports that he quit smoking about 36 years ago. His smoking use included cigarettes. He started smoking about 65 years ago. He has a 45.00 pack-year smoking history. He has never been exposed to tobacco smoke. He has never used smokeless tobacco. He reports that he does not drink alcohol and does not use drugs.  ROS: All other review of systems were reviewed and are negative except what is noted above in HPI  Physical Exam: BP (!) 189/94   Pulse 79   Constitutional:  Alert and oriented, No acute distress. HEENT: [place] AT, moist mucus membranes.  Trachea midline, no masses. Cardiovascular: No clubbing, cyanosis, or edema. Respiratory: Normal respiratory effort, no increased work of breathing. GI: Abdomen is soft, nontender, nondistended, no abdominal masses GU: No CVA tenderness.  Lymph: No cervical or inguinal lymphadenopathy. Skin: No rashes, bruises or suspicious lesions. Neurologic: Grossly intact, no focal deficits, moving all 4 extremities. Psychiatric: Normal mood and affect.  Laboratory Data: Lab Results  Component Value Date   WBC 7.1 11/28/2020   HGB 11.3 (L) 11/28/2020   HCT 32.9 (L) 11/28/2020   MCV 90.6 11/28/2020   PLT 450 (H) 11/28/2020    Lab Results  Component Value Date   CREATININE 0.68 11/28/2020    Lab Results  Component Value Date   PSA 3.60 01/06/2009   PSA 3.05 03/25/2007    No results found for: "TESTOSTERONE"  No results found for: "HGBA1C"  Urinalysis    Component Value Date/Time   COLORURINE STRAW (A) 11/21/2020 1230   APPEARANCEUR Hazy (A) 05/30/2022 1326   LABSPEC  1.005 11/21/2020 1230   PHURINE 7.0 11/21/2020 1230   GLUCOSEU Negative 05/30/2022 1326   HGBUR NEGATIVE 11/21/2020 1230   HGBUR negative 10/27/2008 0838   BILIRUBINUR Negative 05/30/2022 1326   KETONESUR NEGATIVE 11/21/2020 1230   PROTEINUR Trace (A) 05/30/2022 1326   PROTEINUR NEGATIVE 11/21/2020 1230   UROBILINOGEN 0.2 01/11/2014 1405   NITRITE Negative 05/30/2022 1326   NITRITE NEGATIVE 11/21/2020 1230   LEUKOCYTESUR 1+ (A) 05/30/2022 1326   LEUKOCYTESUR NEGATIVE 11/21/2020 1230    Lab Results  Component Value Date   LABMICR See below: 05/30/2022   WBCUA 0-5 05/30/2022   LABEPIT 0-10 05/30/2022   MUCUS Present 02/26/2022   BACTERIA Moderate (A) 05/30/2022    Pertinent Imaging:  No results found for this or any previous visit.  No results found for this or any previous visit.  No results found for this or any previous visit.  No results found for this or any previous visit.  No results found for this or any previous visit.  No valid procedures specified. No results found for this or any previous visit.  No results found for this or any previous visit.   Assessment & Plan:    1. Benign prostatic hyperplasia with urinary obstruction Uroxatral '10mg'$  qhs - Urinalysis, Routine w reflex microscopic  2. Overactive bladder -patient instructed to take lasix at 8am  3. Weak urinary stream -uroxatral '10mg'$  qhs  4. Recurrent UTI -urine for culture   No follow-ups on file.  Nicolette Bang, MD  [facility] [place]

## 2022-12-20 LAB — MICROSCOPIC EXAMINATION

## 2022-12-20 LAB — URINALYSIS, ROUTINE W REFLEX MICROSCOPIC
Bilirubin, UA: NEGATIVE
Glucose, UA: NEGATIVE
Ketones, UA: NEGATIVE
Nitrite, UA: NEGATIVE
Protein,UA: NEGATIVE
Specific Gravity, UA: 1.015 (ref 1.005–1.030)
Urobilinogen, Ur: 0.2 mg/dL (ref 0.2–1.0)
pH, UA: 7.5 (ref 5.0–7.5)

## 2022-12-22 LAB — URINE CULTURE

## 2022-12-25 ENCOUNTER — Telehealth: Payer: Self-pay

## 2022-12-25 MED ORDER — NITROFURANTOIN MONOHYD MACRO 100 MG PO CAPS: 100.0000 mg | ORAL_CAPSULE | Freq: Two times a day (BID) | ORAL | 0 refills | Status: DC

## 2022-12-25 NOTE — Telephone Encounter (Signed)
Patients wife made aware of patients positive urine culture and antibiotic sent to pharmacy. Wife made aware that pt will need to stop nightly prophylaxis until 7 day course is completed. Wife voiced understanding.

## 2022-12-25 NOTE — Telephone Encounter (Signed)
-----   Message from Cleon Gustin, MD sent at 12/25/2022  9:35 AM EDT ----- Macrobid 100mg  BID for 7 days ----- Message ----- From: Iris Pert, LPN Sent: [id number]   4:46 PM EDT To: Cleon Gustin, MD  No tx started

## 2022-12-26 DIAGNOSIS — Z9181 History of falling: Secondary | ICD-10-CM | POA: Diagnosis not present

## 2022-12-26 DIAGNOSIS — M79671 Pain in right foot: Secondary | ICD-10-CM | POA: Diagnosis not present

## 2022-12-26 DIAGNOSIS — M6281 Muscle weakness (generalized): Secondary | ICD-10-CM | POA: Diagnosis not present

## 2022-12-31 DIAGNOSIS — Z9181 History of falling: Secondary | ICD-10-CM | POA: Diagnosis not present

## 2022-12-31 DIAGNOSIS — M6281 Muscle weakness (generalized): Secondary | ICD-10-CM | POA: Diagnosis not present

## 2022-12-31 DIAGNOSIS — M79671 Pain in right foot: Secondary | ICD-10-CM | POA: Diagnosis not present

## 2023-01-02 DIAGNOSIS — Z9181 History of falling: Secondary | ICD-10-CM | POA: Diagnosis not present

## 2023-01-02 DIAGNOSIS — M79671 Pain in right foot: Secondary | ICD-10-CM | POA: Diagnosis not present

## 2023-01-02 DIAGNOSIS — M6281 Muscle weakness (generalized): Secondary | ICD-10-CM | POA: Diagnosis not present

## 2023-01-05 ENCOUNTER — Other Ambulatory Visit: Payer: Self-pay | Admitting: Urology

## 2023-01-05 ENCOUNTER — Other Ambulatory Visit: Payer: Self-pay | Admitting: Internal Medicine

## 2023-01-05 DIAGNOSIS — N390 Urinary tract infection, site not specified: Secondary | ICD-10-CM

## 2023-01-07 DIAGNOSIS — M6281 Muscle weakness (generalized): Secondary | ICD-10-CM | POA: Diagnosis not present

## 2023-01-07 DIAGNOSIS — Z9181 History of falling: Secondary | ICD-10-CM | POA: Diagnosis not present

## 2023-01-07 DIAGNOSIS — M79671 Pain in right foot: Secondary | ICD-10-CM | POA: Diagnosis not present

## 2023-01-09 DIAGNOSIS — M79671 Pain in right foot: Secondary | ICD-10-CM | POA: Diagnosis not present

## 2023-01-09 DIAGNOSIS — Z9181 History of falling: Secondary | ICD-10-CM | POA: Diagnosis not present

## 2023-01-09 DIAGNOSIS — M6281 Muscle weakness (generalized): Secondary | ICD-10-CM | POA: Diagnosis not present

## 2023-01-16 DIAGNOSIS — M6281 Muscle weakness (generalized): Secondary | ICD-10-CM | POA: Diagnosis not present

## 2023-01-16 DIAGNOSIS — M79671 Pain in right foot: Secondary | ICD-10-CM | POA: Diagnosis not present

## 2023-01-16 DIAGNOSIS — Z9181 History of falling: Secondary | ICD-10-CM | POA: Diagnosis not present

## 2023-01-21 DIAGNOSIS — I739 Peripheral vascular disease, unspecified: Secondary | ICD-10-CM | POA: Diagnosis not present

## 2023-01-21 DIAGNOSIS — M79674 Pain in right toe(s): Secondary | ICD-10-CM | POA: Diagnosis not present

## 2023-01-21 DIAGNOSIS — M79671 Pain in right foot: Secondary | ICD-10-CM | POA: Diagnosis not present

## 2023-01-21 DIAGNOSIS — M79672 Pain in left foot: Secondary | ICD-10-CM | POA: Diagnosis not present

## 2023-01-21 DIAGNOSIS — M6281 Muscle weakness (generalized): Secondary | ICD-10-CM | POA: Diagnosis not present

## 2023-01-21 DIAGNOSIS — Z9181 History of falling: Secondary | ICD-10-CM | POA: Diagnosis not present

## 2023-01-21 DIAGNOSIS — L110 Acquired keratosis follicularis: Secondary | ICD-10-CM | POA: Diagnosis not present

## 2023-01-21 DIAGNOSIS — M79675 Pain in left toe(s): Secondary | ICD-10-CM | POA: Diagnosis not present

## 2023-01-23 DIAGNOSIS — M6281 Muscle weakness (generalized): Secondary | ICD-10-CM | POA: Diagnosis not present

## 2023-01-23 DIAGNOSIS — Z9181 History of falling: Secondary | ICD-10-CM | POA: Diagnosis not present

## 2023-01-23 DIAGNOSIS — M79671 Pain in right foot: Secondary | ICD-10-CM | POA: Diagnosis not present

## 2023-01-28 DIAGNOSIS — M6281 Muscle weakness (generalized): Secondary | ICD-10-CM | POA: Diagnosis not present

## 2023-01-28 DIAGNOSIS — M79671 Pain in right foot: Secondary | ICD-10-CM | POA: Diagnosis not present

## 2023-01-28 DIAGNOSIS — Z9181 History of falling: Secondary | ICD-10-CM | POA: Diagnosis not present

## 2023-01-29 DIAGNOSIS — M79674 Pain in right toe(s): Secondary | ICD-10-CM | POA: Diagnosis not present

## 2023-01-29 DIAGNOSIS — M79671 Pain in right foot: Secondary | ICD-10-CM | POA: Diagnosis not present

## 2023-01-29 DIAGNOSIS — M79672 Pain in left foot: Secondary | ICD-10-CM | POA: Diagnosis not present

## 2023-01-29 DIAGNOSIS — G5790 Unspecified mononeuropathy of unspecified lower limb: Secondary | ICD-10-CM | POA: Diagnosis not present

## 2023-01-29 DIAGNOSIS — M79675 Pain in left toe(s): Secondary | ICD-10-CM | POA: Diagnosis not present

## 2023-01-29 DIAGNOSIS — I739 Peripheral vascular disease, unspecified: Secondary | ICD-10-CM | POA: Diagnosis not present

## 2023-02-02 ENCOUNTER — Other Ambulatory Visit: Payer: Self-pay | Admitting: Urology

## 2023-02-02 DIAGNOSIS — N390 Urinary tract infection, site not specified: Secondary | ICD-10-CM

## 2023-02-04 DIAGNOSIS — M79671 Pain in right foot: Secondary | ICD-10-CM | POA: Diagnosis not present

## 2023-02-04 DIAGNOSIS — M6281 Muscle weakness (generalized): Secondary | ICD-10-CM | POA: Diagnosis not present

## 2023-02-04 DIAGNOSIS — Z9181 History of falling: Secondary | ICD-10-CM | POA: Diagnosis not present

## 2023-02-06 DIAGNOSIS — M79671 Pain in right foot: Secondary | ICD-10-CM | POA: Diagnosis not present

## 2023-02-06 DIAGNOSIS — Z9181 History of falling: Secondary | ICD-10-CM | POA: Diagnosis not present

## 2023-02-06 DIAGNOSIS — M6281 Muscle weakness (generalized): Secondary | ICD-10-CM | POA: Diagnosis not present

## 2023-02-11 DIAGNOSIS — M79671 Pain in right foot: Secondary | ICD-10-CM | POA: Diagnosis not present

## 2023-02-11 DIAGNOSIS — Z9181 History of falling: Secondary | ICD-10-CM | POA: Diagnosis not present

## 2023-02-11 DIAGNOSIS — M6281 Muscle weakness (generalized): Secondary | ICD-10-CM | POA: Diagnosis not present

## 2023-02-14 DIAGNOSIS — M79671 Pain in right foot: Secondary | ICD-10-CM | POA: Diagnosis not present

## 2023-02-14 DIAGNOSIS — M6281 Muscle weakness (generalized): Secondary | ICD-10-CM | POA: Diagnosis not present

## 2023-02-14 DIAGNOSIS — Z9181 History of falling: Secondary | ICD-10-CM | POA: Diagnosis not present

## 2023-02-16 ENCOUNTER — Other Ambulatory Visit: Payer: Self-pay | Admitting: Internal Medicine

## 2023-02-18 DIAGNOSIS — Z9181 History of falling: Secondary | ICD-10-CM | POA: Diagnosis not present

## 2023-02-18 DIAGNOSIS — M79671 Pain in right foot: Secondary | ICD-10-CM | POA: Diagnosis not present

## 2023-02-18 DIAGNOSIS — M6281 Muscle weakness (generalized): Secondary | ICD-10-CM | POA: Diagnosis not present

## 2023-02-20 DIAGNOSIS — Z9181 History of falling: Secondary | ICD-10-CM | POA: Diagnosis not present

## 2023-02-20 DIAGNOSIS — E7849 Other hyperlipidemia: Secondary | ICD-10-CM | POA: Diagnosis not present

## 2023-02-20 DIAGNOSIS — Z1329 Encounter for screening for other suspected endocrine disorder: Secondary | ICD-10-CM | POA: Diagnosis not present

## 2023-02-20 DIAGNOSIS — E876 Hypokalemia: Secondary | ICD-10-CM | POA: Diagnosis not present

## 2023-02-20 DIAGNOSIS — R739 Hyperglycemia, unspecified: Secondary | ICD-10-CM | POA: Diagnosis not present

## 2023-02-20 DIAGNOSIS — M6281 Muscle weakness (generalized): Secondary | ICD-10-CM | POA: Diagnosis not present

## 2023-02-20 DIAGNOSIS — M79671 Pain in right foot: Secondary | ICD-10-CM | POA: Diagnosis not present

## 2023-02-21 DIAGNOSIS — M79675 Pain in left toe(s): Secondary | ICD-10-CM | POA: Diagnosis not present

## 2023-02-21 DIAGNOSIS — M792 Neuralgia and neuritis, unspecified: Secondary | ICD-10-CM | POA: Diagnosis not present

## 2023-02-21 DIAGNOSIS — M79674 Pain in right toe(s): Secondary | ICD-10-CM | POA: Diagnosis not present

## 2023-02-21 DIAGNOSIS — M79672 Pain in left foot: Secondary | ICD-10-CM | POA: Diagnosis not present

## 2023-02-21 DIAGNOSIS — G5790 Unspecified mononeuropathy of unspecified lower limb: Secondary | ICD-10-CM | POA: Diagnosis not present

## 2023-02-21 DIAGNOSIS — M79671 Pain in right foot: Secondary | ICD-10-CM | POA: Diagnosis not present

## 2023-02-26 ENCOUNTER — Encounter: Payer: Self-pay | Admitting: Internal Medicine

## 2023-02-26 ENCOUNTER — Ambulatory Visit (INDEPENDENT_AMBULATORY_CARE_PROVIDER_SITE_OTHER): Payer: Medicare Other | Admitting: Internal Medicine

## 2023-02-26 VITALS — BP 189/104 | HR 71 | Temp 98.0°F | Ht 69.5 in | Wt 247.2 lb

## 2023-02-26 DIAGNOSIS — K219 Gastro-esophageal reflux disease without esophagitis: Secondary | ICD-10-CM | POA: Diagnosis not present

## 2023-02-26 DIAGNOSIS — K5909 Other constipation: Secondary | ICD-10-CM | POA: Diagnosis not present

## 2023-02-26 NOTE — Progress Notes (Unsigned)
Primary Care Physician:  Juliette Alcide, MD Primary Gastroenterologist:  Dr. Jena Gauss  Pre-Procedure History & Physical: HPI:  Julian Fowler is a 88 y.o. male here for follow-up of GERD.  Has been on Nexium 40 mg twice daily with excellent control of his reflux symptoms.  No dysphagia.  Has not tried de-escalate dosing.  MiraLAX works well for constipation.  Taken as needed.  No bleeding.  Past Medical History:  Diagnosis Date   Allergic rhinitis    Aortic stenosis    Arthritis    Atrial fibrillation (HCC)    BPH (benign prostatic hyperplasia)    Coronary artery calcification seen on CT scan    Easy bruisability    ED (erectile dysfunction)    Essential hypertension    GERD (gastroesophageal reflux disease)    H/O hiatal hernia    Hearing loss in left ear    History of asbestosis    History of shingles    Hyperlipidemia    Overactive bladder    PSVT (paroxysmal supraventricular tachycardia)    Sleep apnea    No longer on CPAP following weight loss   Tinnitus     Past Surgical History:  Procedure Laterality Date   BACK SURGERY  2014   herniated L1, L2   Dr Channing Mutters   BRAIN SURGERY     CEREBRAL EMBOLIZATION  12/2011   "radiation therapy-did not work"   CHOLECYSTECTOMY  6/98   COLONOSCOPY  09/2009   Dr. Cleotis Nipper: normal, internal hemorrhoids    COLONOSCOPY N/A 02/28/2017   Dr. Jena Gauss: Hemorrhoids, grade 3, mild diverticulosis.   CRANIECTOMY FOR EXCISION OF ACOUSTIC NEUROMA  3/95   MINOR AMPUTATION OF DIGIT Left 12/31/2018   Procedure: REVISION AMPUTATION OF LEFT INDEX FINGER, IRRIGATION AND DEBRIDEMENT LEFT INDEX FINGER;  Surgeon: Ernest Mallick, MD;  Location: [facility];  Service: Orthopedics;  Laterality: Left;   RADIOLOGY WITH ANESTHESIA N/A 07/07/2014   Procedure: EMBOLIZATION;  Surgeon: Oneal Grout, MD;  Location: [facility];  Service: Radiology;  Laterality: N/A;   TOTAL KNEE ARTHROPLASTY Left 07/06/2013   Procedure: LEFT TOTAL KNEE ARTHROPLASTY;  Surgeon:  Loanne Drilling, MD;  Location: [facility];  Service: Orthopedics;  Laterality: Left;   TOTAL KNEE ARTHROPLASTY Right 01/18/2014   Procedure: RIGHT TOTAL KNEE ARTHROPLASTY;  Surgeon: Loanne Drilling, MD;  Location: [facility];  Service: Orthopedics;  Laterality: Right;   TRIGGER FINGER RELEASE  2003   (thumb) middle finger (2006)    Prior to Admission medications   Medication Sig Start Date End Date Taking? Authorizing Provider  acetaminophen (TYLENOL) 325 MG tablet Take 325 mg by mouth as needed.   Yes [provider]  alfuzosin (UROXATRAL) 10 MG 24 hr tablet Take 1 tablet (10 mg total) by mouth 2 (two) times daily. 12/19/22  Yes McKenzie, Mardene Celeste, MD  azelastine (ASTELIN) 0.1 % nasal spray 1-2 puffs each nostril every 8 hours if needed for drainage Patient taking differently: as needed. 1-2 puffs each nostril every 8 hours if needed for drainage 12/17/17  Yes Young, Joni Fears D, MD  Calcium Carb-Vit D-C-E-Mineral (OS-CAL ULTRA) 600 MG TABS Take 1 tablet by mouth daily.    Yes [provider]  diclofenac Sodium (VOLTAREN) 1 % GEL Apply 2 g topically daily as needed (For pain). 03/08/20  Yes [provider]  esomeprazole (NEXIUM) 40 MG capsule TAKE 1 CAPSULE BY MOUTH TWICE DAILY BEFORE A MEAL 02/19/23  Yes Sirron Francesconi, Gerrit Friends, MD  fexofenadine (  ALLEGRA) 180 MG tablet Take 180 mg by mouth daily as needed for allergies.   Yes [provider]  fluticasone (FLONASE) 50 MCG/ACT nasal spray Place 2 sprays into both nostrils as needed for allergies or rhinitis.   Yes [provider]  furosemide (LASIX) 20 MG tablet Take 1 tablet (20 mg total) by mouth daily. 11/28/20  Yes Shah, Pratik D, DO  hydrALAZINE (APRESOLINE) 50 MG tablet Take 1.5 tablets (75 mg total) by mouth 3 (three) times daily. 09/17/22  Yes Jonelle Sidle, MD  latanoprost (XALATAN) 0.005 % ophthalmic solution Place 1 drop into both eyes at bedtime.  09/01/12  Yes [provider]  LORazepam (ATIVAN) 1 MG  tablet TAKE 1 TABLET BY MOUTH THREE TIMES DAILY AS NEEDED FOR ANXIETY 10/24/22  Yes Young, Clinton D, MD  losartan (COZAAR) 100 MG tablet Take 100 mg by mouth every morning.    Yes [provider]  metoprolol succinate (TOPROL-XL) 25 MG 24 hr tablet Take 25 mg by mouth daily. 1/2 tablet daily   Yes [provider]  Multiple Vitamins-Minerals (CENTRUM SILVER PO) Take 1 tablet by mouth daily.    Yes [provider]  nitrofurantoin (MACRODANTIN) 50 MG capsule Take 1 capsule by mouth at bedtime 02/05/23  Yes McKenzie, Mardene Celeste, MD  Polyethyl Glycol-Propyl Glycol (SYSTANE OP) Place 1 drop into both eyes daily.   Yes [provider]  polyethylene glycol (MIRALAX / GLYCOLAX) packet Take 17 g by mouth at bedtime.   Yes [provider]  potassium chloride SA (KLOR-CON M20) 20 MEQ tablet Take 1 tablet by mouth twice daily 08/28/22  Yes Jonelle Sidle, MD  pravastatin (PRAVACHOL) 40 MG tablet Take 1 tablet by mouth at bedtime.  10/05/14  Yes [provider]  PROAIR HFA 108 (90 Base) MCG/ACT inhaler INHALE 2 PUFFS BY MOUTH EVERY 6 HOURS AS NEEDED FOR WHEEZING FOR SHORTNESS OF BREATH Patient taking differently: Inhale 1 puff into the lungs every 6 (six) hours as needed. 03/18/20  Yes Young, Clinton D, MD  sodium chloride 1 g tablet Take 1 g by mouth 3 (three) times daily. 02/08/21  Yes [provider]  SYMBICORT 160-4.5 MCG/ACT inhaler INHALE 2 PUFFS BY MOUTH  TWICE DAILY THEN  RINSE  MOUTH 01/31/21  Yes Young, Clinton D, MD  tadalafil (CIALIS) 20 MG tablet Take 1 tablet (20 mg total) by mouth daily as needed. 05/30/22  Yes McKenzie, Mardene Celeste, MD  Wheat Dextrin (BENEFIBER) POWD Take 1 packet by mouth 2 (two) times daily.   Yes [provider]  Zinc 25 MG TABS Take 1 tablet by mouth daily.   Yes [provider]  zolpidem (AMBIEN) 10 MG tablet Take 1 tablet by mouth at bedtime.  01/24/16  Yes [provider]    Allergies as  of 02/26/2023 - Review Complete 02/26/2023  Allergen Reaction Noted   Latex Itching 07/01/2019   Other  04/10/2012   Bactrim [sulfamethoxazole-trimethoprim]  12/27/2020   Prednisone  12/17/2017   Codeine Other (See Comments)    Tape Other (See Comments) 04/10/2012    Family History  Problem Relation Age of Onset   Cancer Father        oral cancer   Breast cancer Mother    Heart attack Mother    Hypertension Sister        Bypass x4   Alcohol abuse Sister    Alzheimer's disease Sister    Kidney disease Sister    Colon cancer  Neg Hx     Social History   Socioeconomic History   Marital status: Married    Spouse name: Not on file   Number of children: Not on file   Years of education: Not on file   Highest education level: Not on file  Occupational History   Not on file  Tobacco Use   Smoking status: Former    Packs/day: 1.50    Years: 30.00    Additional pack years: 0.00    Total pack years: 45.00    Types: Cigarettes    Start date: 04/24/1957    Quit date: 10/15/1986    Years since quitting: 36.3    Passive exposure: Never   Smokeless tobacco: Never  Vaping Use   Vaping Use: Never used  Substance and Sexual Activity   Alcohol use: No    Alcohol/week: 0.0 standard drinks of alcohol    Comment: Used to drink heavily at times   Drug use: No   Sexual activity: Not Currently  Other Topics Concern   Not on file  Social History Narrative   Co-dependent relationship with his 30 year old son who has drug and financial problems   Recently married to [place] on 01/02/2014   Social Determinants of Health   Financial Resource Strain: Not on file  Food Insecurity: Not on file  Transportation Needs: Not on file  Physical Activity: Not on file  Stress: Not on file  Social Connections: Not on file  Intimate Partner Violence: Not on file    Review of Systems: See HPI, otherwise negative ROS  Physical Exam: BP (!) 147/77 (BP Location: Right Arm, Patient Position:  Sitting, Cuff Size: Large)   Pulse 67   Temp 98 F (36.7 C) (Oral)   Ht 5' 9.5" (1.765 m)   Wt 247 lb 3.2 oz (112.1 kg)   SpO2 98%   BMI 35.98 kg/m  General:   Elderly gentleman in no acute distress.  Accompanied by his wife.   Lungs:  Clear throughout to auscultation.   No wheezes, crackles, or rhonchi. No acute distress. Heart:  Regular rate and rhythm; no murmurs, clicks, rubs,  or gallops. Abdomen: Non-distended, normal bowel sounds.  Soft and nontender without appreciable mass or hepatosplenomegaly.   Impression/Plan: 88 year old gentleman longstanding GERD well-controlled on twice daily esomeprazole.  No upper GI tract symptoms.  Occasional constipation managed well with MiraLAX.  Commendations: As discussed, may decrease Nexium to once daily if that works if breakthrough symptoms occur, go back to twice daily.  Best taken 30 minutes before meals.  Continue MiraLAX on a as needed basis.  Unless something comes up, we will plan to see you back in 1 yea  Notice: This dictation was prepared with Dragon dictation along with smaller phrase technology. Any transcriptional errors that result from this process are unintentional and may not be corrected upon review.

## 2023-02-26 NOTE — Patient Instructions (Signed)
It was good to see you again today!    As discussed, may decrease Nexium to once daily if that works for you.  If that does not cover your indigestion, increase dosing to twice daily  - best taken 30 minutes before meals.  Unless something comes up, we will plan to see you back in 1 year.

## 2023-02-27 DIAGNOSIS — E7849 Other hyperlipidemia: Secondary | ICD-10-CM | POA: Diagnosis not present

## 2023-02-27 DIAGNOSIS — Z1331 Encounter for screening for depression: Secondary | ICD-10-CM | POA: Diagnosis not present

## 2023-02-27 DIAGNOSIS — I350 Nonrheumatic aortic (valve) stenosis: Secondary | ICD-10-CM | POA: Diagnosis not present

## 2023-02-27 DIAGNOSIS — E871 Hypo-osmolality and hyponatremia: Secondary | ICD-10-CM | POA: Diagnosis not present

## 2023-02-27 DIAGNOSIS — Z1389 Encounter for screening for other disorder: Secondary | ICD-10-CM | POA: Diagnosis not present

## 2023-02-27 DIAGNOSIS — R600 Localized edema: Secondary | ICD-10-CM | POA: Diagnosis not present

## 2023-02-27 DIAGNOSIS — G5790 Unspecified mononeuropathy of unspecified lower limb: Secondary | ICD-10-CM | POA: Diagnosis not present

## 2023-02-27 DIAGNOSIS — M48061 Spinal stenosis, lumbar region without neurogenic claudication: Secondary | ICD-10-CM | POA: Diagnosis not present

## 2023-02-27 DIAGNOSIS — I4819 Other persistent atrial fibrillation: Secondary | ICD-10-CM | POA: Diagnosis not present

## 2023-02-27 DIAGNOSIS — R030 Elevated blood-pressure reading, without diagnosis of hypertension: Secondary | ICD-10-CM | POA: Diagnosis not present

## 2023-02-27 DIAGNOSIS — R519 Headache, unspecified: Secondary | ICD-10-CM | POA: Diagnosis not present

## 2023-02-27 DIAGNOSIS — R2681 Unsteadiness on feet: Secondary | ICD-10-CM | POA: Diagnosis not present

## 2023-03-02 ENCOUNTER — Other Ambulatory Visit: Payer: Self-pay | Admitting: Urology

## 2023-03-02 ENCOUNTER — Other Ambulatory Visit: Payer: Self-pay | Admitting: Cardiology

## 2023-03-02 DIAGNOSIS — N390 Urinary tract infection, site not specified: Secondary | ICD-10-CM

## 2023-03-21 DIAGNOSIS — M79672 Pain in left foot: Secondary | ICD-10-CM | POA: Diagnosis not present

## 2023-03-21 DIAGNOSIS — M79674 Pain in right toe(s): Secondary | ICD-10-CM | POA: Diagnosis not present

## 2023-03-21 DIAGNOSIS — M792 Neuralgia and neuritis, unspecified: Secondary | ICD-10-CM | POA: Diagnosis not present

## 2023-03-21 DIAGNOSIS — E1140 Type 2 diabetes mellitus with diabetic neuropathy, unspecified: Secondary | ICD-10-CM | POA: Diagnosis not present

## 2023-03-21 DIAGNOSIS — M79671 Pain in right foot: Secondary | ICD-10-CM | POA: Diagnosis not present

## 2023-03-21 DIAGNOSIS — M79675 Pain in left toe(s): Secondary | ICD-10-CM | POA: Diagnosis not present

## 2023-03-25 ENCOUNTER — Ambulatory Visit (INDEPENDENT_AMBULATORY_CARE_PROVIDER_SITE_OTHER): Payer: Medicare Other | Admitting: Urology

## 2023-03-25 ENCOUNTER — Encounter: Payer: Self-pay | Admitting: Urology

## 2023-03-25 VITALS — BP 165/75 | HR 67

## 2023-03-25 DIAGNOSIS — N390 Urinary tract infection, site not specified: Secondary | ICD-10-CM

## 2023-03-25 DIAGNOSIS — N138 Other obstructive and reflux uropathy: Secondary | ICD-10-CM

## 2023-03-25 DIAGNOSIS — R3912 Poor urinary stream: Secondary | ICD-10-CM | POA: Diagnosis not present

## 2023-03-25 DIAGNOSIS — Z87440 Personal history of urinary (tract) infections: Secondary | ICD-10-CM | POA: Diagnosis not present

## 2023-03-25 DIAGNOSIS — N401 Enlarged prostate with lower urinary tract symptoms: Secondary | ICD-10-CM

## 2023-03-25 MED ORDER — SILODOSIN 8 MG PO CAPS: 8.0000 mg | ORAL_CAPSULE | Freq: Every day | ORAL | 11 refills | Status: DC

## 2023-03-25 NOTE — Progress Notes (Signed)
03/25/2023 3:41 PM   Clydene Pugh 1935-02-11 [id number]  Referring provider: Juliette Alcide, MD [address] [place],  [place] [address]  Followup recurrent UTI and BPH   HPI: Mr Indelicato is a 88yo here for followup for recurrent UTI and BPH. No UTIs since last visit.  He has nocturia 4-5x. IPSS 19  QOL 5. He takes flomax 0.4mg  daily. He has failed multiple anticholinergics and beta 3s. Urine stream fair,.    PMH: Past Medical History:  Diagnosis Date   Allergic rhinitis    Aortic stenosis    Arthritis    Atrial fibrillation (HCC)    BPH (benign prostatic hyperplasia)    Coronary artery calcification seen on CT scan    Easy bruisability    ED (erectile dysfunction)    Essential hypertension    GERD (gastroesophageal reflux disease)    H/O hiatal hernia    Hearing loss in left ear    History of asbestosis    History of shingles    Hyperlipidemia    Overactive bladder    PSVT (paroxysmal supraventricular tachycardia)    Sleep apnea    No longer on CPAP following weight loss   Tinnitus     Surgical History: Past Surgical History:  Procedure Laterality Date   BACK SURGERY  2014   herniated L1, L2   Dr Channing Mutters   BRAIN SURGERY     CEREBRAL EMBOLIZATION  12/2011   "radiation therapy-did not work"   CHOLECYSTECTOMY  6/98   COLONOSCOPY  09/2009   Dr. Cleotis Nipper: normal, internal hemorrhoids    COLONOSCOPY N/A 02/28/2017   Dr. Jena Gauss: Hemorrhoids, grade 3, mild diverticulosis.   CRANIECTOMY FOR EXCISION OF ACOUSTIC NEUROMA  3/95   MINOR AMPUTATION OF DIGIT Left 12/31/2018   Procedure: REVISION AMPUTATION OF LEFT INDEX FINGER, IRRIGATION AND DEBRIDEMENT LEFT INDEX FINGER;  Surgeon: Ernest Mallick, MD;  Location: [facility];  Service: Orthopedics;  Laterality: Left;   RADIOLOGY WITH ANESTHESIA N/A 07/07/2014   Procedure: EMBOLIZATION;  Surgeon: Oneal Grout, MD;  Location: [facility];  Service: Radiology;  Laterality: N/A;   TOTAL KNEE ARTHROPLASTY Left 07/06/2013    Procedure: LEFT TOTAL KNEE ARTHROPLASTY;  Surgeon: Loanne Drilling, MD;  Location: [facility];  Service: Orthopedics;  Laterality: Left;   TOTAL KNEE ARTHROPLASTY Right 01/18/2014   Procedure: RIGHT TOTAL KNEE ARTHROPLASTY;  Surgeon: Loanne Drilling, MD;  Location: [facility];  Service: Orthopedics;  Laterality: Right;   TRIGGER FINGER RELEASE  2003   (thumb) middle finger (2006)    Home Medications:  Allergies as of 03/25/2023       Reactions   Latex Itching   Other    Other Reaction(s): Other Skin tears, use paper tape   Bactrim [sulfamethoxazole-trimethoprim]    Prednisone    Pt is high functioning and out of sorts.   Codeine Other (See Comments)   REACTION: groggy   Tape Other (See Comments)   Skin tears, use paper tape        Medication List        Accurate as of March 25, 2023  3:41 PM. If you have any questions, ask your nurse or doctor.          acetaminophen 325 MG tablet Commonly known as: TYLENOL Take 325 mg by mouth as needed.   alfuzosin 10 MG 24 hr tablet Commonly known as: UROXATRAL Take 1 tablet (10 mg total) by mouth 2 (two) times daily.   azelastine  0.1 % nasal spray Commonly known as: ASTELIN 1-2 puffs each nostril every 8 hours if needed for drainage What changed:  when to take this reasons to take this   Benefiber Powd Take 1 packet by mouth 2 (two) times daily.   CENTRUM SILVER PO Take 1 tablet by mouth daily.   diclofenac Sodium 1 % Gel Commonly known as: VOLTAREN Apply 2 g topically daily as needed (For pain).   esomeprazole 40 MG capsule Commonly known as: NEXIUM TAKE 1 CAPSULE BY MOUTH TWICE DAILY BEFORE A MEAL   fexofenadine 180 MG tablet Commonly known as: ALLEGRA Take 180 mg by mouth daily as needed for allergies.   fluticasone 50 MCG/ACT nasal spray Commonly known as: FLONASE Place 2 sprays into both nostrils as needed for allergies or rhinitis.   furosemide 20 MG tablet Commonly known as: LASIX Take 1 tablet (20 mg total)  by mouth daily.   hydrALAZINE 50 MG tablet Commonly known as: APRESOLINE Take 1.5 tablets (75 mg total) by mouth 3 (three) times daily.   Klor-Con M20 20 MEQ tablet Generic drug: potassium chloride SA Take 1 tablet by mouth twice daily   latanoprost 0.005 % ophthalmic solution Commonly known as: XALATAN Place 1 drop into both eyes at bedtime.   LORazepam 1 MG tablet Commonly known as: ATIVAN TAKE 1 TABLET BY MOUTH THREE TIMES DAILY AS NEEDED FOR ANXIETY   losartan 100 MG tablet Commonly known as: COZAAR Take 100 mg by mouth every morning.   metoprolol succinate 25 MG 24 hr tablet Commonly known as: TOPROL-XL Take 25 mg by mouth daily. 1/2 tablet daily   nitrofurantoin 50 MG capsule Commonly known as: MACRODANTIN Take 1 capsule by mouth at bedtime   Os-Cal Ultra 600 MG Tabs Take 1 tablet by mouth daily.   polyethylene glycol 17 g packet Commonly known as: MIRALAX / GLYCOLAX Take 17 g by mouth at bedtime.   pravastatin 40 MG tablet Commonly known as: PRAVACHOL Take 1 tablet by mouth at bedtime.   ProAir HFA 108 (90 Base) MCG/ACT inhaler Generic drug: albuterol INHALE 2 PUFFS BY MOUTH EVERY 6 HOURS AS NEEDED FOR WHEEZING FOR SHORTNESS OF BREATH What changed: See the new instructions.   sodium chloride 1 g tablet Take 1 g by mouth 3 (three) times daily.   Symbicort 160-4.5 MCG/ACT inhaler Generic drug: budesonide-formoterol INHALE 2 PUFFS BY MOUTH  TWICE DAILY THEN  RINSE  MOUTH   SYSTANE OP Place 1 drop into both eyes daily.   tadalafil 20 MG tablet Commonly known as: CIALIS Take 1 tablet (20 mg total) by mouth daily as needed.   Zinc 25 MG Tabs Take 1 tablet by mouth daily.   zolpidem 10 MG tablet Commonly known as: AMBIEN Take 1 tablet by mouth at bedtime.        Allergies:  Allergies  Allergen Reactions   Latex Itching   Other     Other Reaction(s): Other  Skin tears, use paper tape   Bactrim [Sulfamethoxazole-Trimethoprim]    Prednisone      Pt is high functioning and out of sorts.   Codeine Other (See Comments)    REACTION: groggy   Tape Other (See Comments)    Skin tears, use paper tape    Family History: Family History  Problem Relation Age of Onset   Cancer Father        oral cancer   Breast cancer Mother    Heart attack Mother    Hypertension Sister  Bypass x4   Alcohol abuse Sister    Alzheimer's disease Sister    Kidney disease Sister    Colon cancer Neg Hx     Social History:  reports that he quit smoking about 36 years ago. His smoking use included cigarettes. He started smoking about 65 years ago. He has a 45.00 pack-year smoking history. He has never been exposed to tobacco smoke. He has never used smokeless tobacco. He reports that he does not drink alcohol and does not use drugs.  ROS: All other review of systems were reviewed and are negative except what is noted above in HPI  Physical Exam: BP (!) 165/75   Pulse 67   Constitutional:  Alert and oriented, No acute distress. HEENT: [place] AT, moist mucus membranes.  Trachea midline, no masses. Cardiovascular: No clubbing, cyanosis, or edema. Respiratory: Normal respiratory effort, no increased work of breathing. GI: Abdomen is soft, nontender, nondistended, no abdominal masses GU: No CVA tenderness.  Lymph: No cervical or inguinal lymphadenopathy. Skin: No rashes, bruises or suspicious lesions. Neurologic: Grossly intact, no focal deficits, moving all 4 extremities. Psychiatric: Normal mood and affect.  Laboratory Data: Lab Results  Component Value Date   WBC 7.1 11/28/2020   HGB 11.3 (L) 11/28/2020   HCT 32.9 (L) 11/28/2020   MCV 90.6 11/28/2020   PLT 450 (H) 11/28/2020    Lab Results  Component Value Date   CREATININE 0.68 11/28/2020    Lab Results  Component Value Date   PSA 3.60 01/06/2009   PSA 3.05 03/25/2007    No results found for: "TESTOSTERONE"  No results found for: "HGBA1C"  Urinalysis    Component Value  Date/Time   COLORURINE STRAW (A) 11/21/2020 1230   APPEARANCEUR Clear 12/19/2022 1300   LABSPEC 1.005 11/21/2020 1230   PHURINE 7.0 11/21/2020 1230   GLUCOSEU Negative 12/19/2022 1300   HGBUR NEGATIVE 11/21/2020 1230   HGBUR negative 10/27/2008 0838   BILIRUBINUR Negative 12/19/2022 1300   KETONESUR NEGATIVE 11/21/2020 1230   PROTEINUR Negative 12/19/2022 1300   PROTEINUR NEGATIVE 11/21/2020 1230   UROBILINOGEN 0.2 01/11/2014 1405   NITRITE Negative 12/19/2022 1300   NITRITE NEGATIVE 11/21/2020 1230   LEUKOCYTESUR 2+ (A) 12/19/2022 1300   LEUKOCYTESUR NEGATIVE 11/21/2020 1230    Lab Results  Component Value Date   LABMICR See below: 12/19/2022   WBCUA 11-30 (A) 12/19/2022   LABEPIT 0-10 12/19/2022   MUCUS Present 02/26/2022   BACTERIA Few 12/19/2022    Pertinent Imaging:  No results found for this or any previous visit.  No results found for this or any previous visit.  No results found for this or any previous visit.  No results found for this or any previous visit.  No results found for this or any previous visit.  No valid procedures specified. No results found for this or any previous visit.  No results found for this or any previous visit.   Assessment & Plan:    1. Benign prostatic hyperplasia with urinary obstruction -Rapaflo 8mg  daily - Urinalysis, Routine w reflex microscopic  2. Recurrent UTI -resolved - Urine Culture  3. Weak urinary stream -rapaflo 8mg  daily   No follow-ups on file.  Wilkie Aye, MD  [facility] [place]

## 2023-03-26 ENCOUNTER — Encounter: Payer: Self-pay | Admitting: Urology

## 2023-03-26 DIAGNOSIS — E876 Hypokalemia: Secondary | ICD-10-CM | POA: Diagnosis not present

## 2023-03-26 DIAGNOSIS — E871 Hypo-osmolality and hyponatremia: Secondary | ICD-10-CM | POA: Diagnosis not present

## 2023-03-26 LAB — MICROSCOPIC EXAMINATION: WBC, UA: >30 /hpf — AB (ref 0–5)

## 2023-03-26 LAB — URINALYSIS, ROUTINE W REFLEX MICROSCOPIC
Bilirubin, UA: NEGATIVE
Glucose, UA: NEGATIVE
Ketones, UA: NEGATIVE
Nitrite, UA: NEGATIVE
Protein,UA: NEGATIVE
Specific Gravity, UA: 1.01 (ref 1.005–1.030)
Urobilinogen, Ur: 1 mg/dL (ref 0.2–1.0)
pH, UA: 7 (ref 5.0–7.5)

## 2023-03-26 NOTE — Patient Instructions (Signed)

## 2023-03-28 LAB — URINE CULTURE

## 2023-03-29 ENCOUNTER — Telehealth: Payer: Self-pay

## 2023-03-29 MED ORDER — NITROFURANTOIN MONOHYD MACRO 100 MG PO CAPS: 100.0000 mg | ORAL_CAPSULE | Freq: Two times a day (BID) | ORAL | 0 refills | Status: DC

## 2023-03-29 NOTE — Telephone Encounter (Signed)
I called and left a detailed message informing patient that his urine culture came back positive and Dr. Ronne Binning gave verbal orders to send in Macrobid 100mg  BID x7days.  Rx sent to pharmacy and I also informed patient via voicemail to hold nightly macrodantin until he completes the 7 day course of Macrobid.  Once he finishes the Macrobid he can resume nightly macrodantin rx.

## 2023-03-30 ENCOUNTER — Other Ambulatory Visit: Payer: Self-pay | Admitting: Cardiology

## 2023-04-02 NOTE — Telephone Encounter (Signed)
I called patient to confirm that pt had started abx sent in on 06/14.  Patient aware of MD response to culture and abx instructions.

## 2023-04-08 DIAGNOSIS — M79674 Pain in right toe(s): Secondary | ICD-10-CM | POA: Diagnosis not present

## 2023-04-08 DIAGNOSIS — I739 Peripheral vascular disease, unspecified: Secondary | ICD-10-CM | POA: Diagnosis not present

## 2023-04-08 DIAGNOSIS — M79672 Pain in left foot: Secondary | ICD-10-CM | POA: Diagnosis not present

## 2023-04-08 DIAGNOSIS — M79671 Pain in right foot: Secondary | ICD-10-CM | POA: Diagnosis not present

## 2023-04-08 DIAGNOSIS — E1140 Type 2 diabetes mellitus with diabetic neuropathy, unspecified: Secondary | ICD-10-CM | POA: Diagnosis not present

## 2023-04-08 DIAGNOSIS — M79675 Pain in left toe(s): Secondary | ICD-10-CM | POA: Diagnosis not present

## 2023-04-09 DIAGNOSIS — G5790 Unspecified mononeuropathy of unspecified lower limb: Secondary | ICD-10-CM | POA: Diagnosis not present

## 2023-04-09 DIAGNOSIS — R2681 Unsteadiness on feet: Secondary | ICD-10-CM | POA: Diagnosis not present

## 2023-04-09 DIAGNOSIS — E7849 Other hyperlipidemia: Secondary | ICD-10-CM | POA: Diagnosis not present

## 2023-04-09 DIAGNOSIS — R030 Elevated blood-pressure reading, without diagnosis of hypertension: Secondary | ICD-10-CM | POA: Diagnosis not present

## 2023-04-09 DIAGNOSIS — E871 Hypo-osmolality and hyponatremia: Secondary | ICD-10-CM | POA: Diagnosis not present

## 2023-04-09 DIAGNOSIS — I4819 Other persistent atrial fibrillation: Secondary | ICD-10-CM | POA: Diagnosis not present

## 2023-04-09 DIAGNOSIS — M48061 Spinal stenosis, lumbar region without neurogenic claudication: Secondary | ICD-10-CM | POA: Diagnosis not present

## 2023-04-09 DIAGNOSIS — Z6837 Body mass index (BMI) 37.0-37.9, adult: Secondary | ICD-10-CM | POA: Diagnosis not present

## 2023-04-15 ENCOUNTER — Encounter: Payer: Self-pay | Admitting: *Deleted

## 2023-04-15 ENCOUNTER — Telehealth: Payer: Self-pay | Admitting: Cardiology

## 2023-04-15 ENCOUNTER — Other Ambulatory Visit: Payer: Self-pay | Admitting: Urology

## 2023-04-15 DIAGNOSIS — N390 Urinary tract infection, site not specified: Secondary | ICD-10-CM

## 2023-04-15 NOTE — Telephone Encounter (Signed)
Sent request for records from PCP regarding discontinuation of lasix and potassium.

## 2023-04-15 NOTE — Telephone Encounter (Signed)
Pt c/o medication issue:  1. Name of Medication: Furosemide, qnd Potassium  2. How are you currently taking this medication (dosage and times per day)?   3. Are you having a reaction (difficulty breathing--STAT)?   4. What is your medication issue? Patient's primary doctor stopped his Furosemide, he also told him to stop his Potassium. Please ask Dr Diona Browner, Is it alright for him to stop his Potassium?

## 2023-04-17 NOTE — Telephone Encounter (Signed)
Per wife, [place], PCP stopped furosemide and potassium due to excessive urination. Wife was concerned that patient's potassium level may drop being off potassium. Advised that potassium was given to replace what was being lost in his urine while taking furosemide. Advised that provider would be informed.

## 2023-04-22 ENCOUNTER — Other Ambulatory Visit: Payer: Self-pay

## 2023-04-22 DIAGNOSIS — N390 Urinary tract infection, site not specified: Secondary | ICD-10-CM

## 2023-04-22 MED ORDER — NITROFURANTOIN MACROCRYSTAL 50 MG PO CAPS
50.0000 mg | ORAL_CAPSULE | Freq: Every day | ORAL | 0 refills | Status: DC
Start: 2023-04-22 — End: 2023-05-08

## 2023-04-23 ENCOUNTER — Other Ambulatory Visit: Payer: Self-pay | Admitting: Internal Medicine

## 2023-05-02 DIAGNOSIS — I4819 Other persistent atrial fibrillation: Secondary | ICD-10-CM | POA: Diagnosis not present

## 2023-05-02 DIAGNOSIS — Z6837 Body mass index (BMI) 37.0-37.9, adult: Secondary | ICD-10-CM | POA: Diagnosis not present

## 2023-05-02 DIAGNOSIS — R2681 Unsteadiness on feet: Secondary | ICD-10-CM | POA: Diagnosis not present

## 2023-05-02 DIAGNOSIS — G5790 Unspecified mononeuropathy of unspecified lower limb: Secondary | ICD-10-CM | POA: Diagnosis not present

## 2023-05-02 DIAGNOSIS — I10 Essential (primary) hypertension: Secondary | ICD-10-CM | POA: Diagnosis not present

## 2023-05-02 DIAGNOSIS — M48061 Spinal stenosis, lumbar region without neurogenic claudication: Secondary | ICD-10-CM | POA: Diagnosis not present

## 2023-05-02 DIAGNOSIS — E871 Hypo-osmolality and hyponatremia: Secondary | ICD-10-CM | POA: Diagnosis not present

## 2023-05-03 ENCOUNTER — Telehealth: Payer: Self-pay | Admitting: Cardiology

## 2023-05-03 NOTE — Telephone Encounter (Signed)
Left message for patient to call back to let us know what dosage his PCP has changed his BP medication to so we can update medication list. Also to update provider

## 2023-05-03 NOTE — Telephone Encounter (Signed)
Pt c/o medication issue:  1. Name of Medication:   metoprolol succinate (TOPROL-XL) 25 MG 24 hr tablet    2. How are you currently taking this medication (dosage and times per day)? Take 25 mg by mouth daily. 1/2 tablet daily   3. Are you having a reaction (difficulty breathing--STAT)? No  4. What is your medication issue? Pt's PCP called to make provider aware that he increased medication due to pt's BP being elevated. He advised pt;'s wife to monitor HR. Please advise

## 2023-05-03 NOTE — Telephone Encounter (Signed)
Pt spouse returning call  

## 2023-05-06 NOTE — Addendum Note (Signed)
Addended by: Sharen Hones on: 05/06/2023 11:42 AM   Modules accepted: Orders

## 2023-05-06 NOTE — Telephone Encounter (Signed)
Pt PCP changed Metoprolol to 25 Mg daily. Changed in chart

## 2023-05-08 ENCOUNTER — Ambulatory Visit: Payer: Medicare Other | Attending: Cardiology | Admitting: Cardiology

## 2023-05-08 ENCOUNTER — Encounter: Payer: Self-pay | Admitting: Cardiology

## 2023-05-08 VITALS — BP 136/88 | HR 63 | Ht 69.0 in | Wt 247.2 lb

## 2023-05-08 DIAGNOSIS — I350 Nonrheumatic aortic (valve) stenosis: Secondary | ICD-10-CM | POA: Diagnosis not present

## 2023-05-08 DIAGNOSIS — E876 Hypokalemia: Secondary | ICD-10-CM | POA: Insufficient documentation

## 2023-05-08 DIAGNOSIS — I4821 Permanent atrial fibrillation: Secondary | ICD-10-CM | POA: Insufficient documentation

## 2023-05-08 DIAGNOSIS — Z79899 Other long term (current) drug therapy: Secondary | ICD-10-CM | POA: Diagnosis not present

## 2023-05-08 MED ORDER — POTASSIUM CHLORIDE CRYS ER 20 MEQ PO TBCR: 20.0000 meq | EXTENDED_RELEASE_TABLET | Freq: Every day | ORAL | 6 refills | Status: DC

## 2023-05-08 MED ORDER — METOPROLOL SUCCINATE ER 25 MG PO TB24: 12.5000 mg | ORAL_TABLET | Freq: Every day | ORAL | 2 refills | Status: DC

## 2023-05-08 NOTE — Patient Instructions (Addendum)
Medication Instructions:  Your physician has recommended you make the following change in your medication:  Decrease metoprolol succinate to 12.5 mg daily Decrease gabapentin to 100 mg twice daily Start potassium 20 meq daily Continue all other medications as prescribed  Labwork: BMET in 2 weeks (05/22/2023) Non-fasting [facility] ([address] [place]) or [facility]  Testing/Procedures: Your physician has requested that you have an echocardiogram. Echocardiography is a painless test that uses sound waves to create images of your heart. It provides your doctor with information about the size and shape of your heart and how well your heart's chambers and valves are working. This procedure takes approximately one hour. There are no restrictions for this procedure. Please do NOT wear cologne, perfume, aftershave, or lotions (deodorant is allowed). Please arrive 15 minutes prior to your appointment time.  Follow-Up: Your physician recommends that you schedule a follow-up appointment in: 3 months  Any Other Special Instructions Will Be Listed Below (If Applicable).  If you need a refill on your cardiac medications before your next appointment, please call your pharmacy.

## 2023-05-08 NOTE — Progress Notes (Signed)
    Cardiology Office Note  Date: 05/08/2023   ID: KAIO KUHLMAN, DOB August 10, 1935, MRN [id number]  History of Present Illness: Julian Fowler is an 88 y.o. male last seen in March.  He is here today with his wife for follow-up visit.  He complains of being fatigued, puffy eyes, frequent urination, neuropathy symptoms, lack of energy.  He has been seeing Dr. Leandrew Koyanagi with medication adjustments made including discontinuation of Lasix about a month ago, also a potassium supplement at that time.  He has been placed on gabapentin and his Toprol-XL was also increased.  He has a history of hyponatremia and hypokalemia, not all of which related to diuretic use.  Also has prostatism and follows with a urologist.  Today we went over his medications in detail.  For now I have recommended that he cut his Toprol-XL back to 12.5 mg daily, decrease gabapentin to 100 mg twice daily in case this is causing any of his symptoms, and also for now resume KCl at 20 mEq once daily with repeat BMET.  He is also due for a follow-up echocardiogram.  Physical Exam: VS:  BP 136/88   Pulse 63   Ht 5\' 9"  (1.753 m)   Wt 247 lb 3.2 oz (112.1 kg)   SpO2 98%   BMI 36.51 kg/m , BMI Body mass index is 36.51 kg/m.  Wt Readings from Last 3 Encounters:  05/08/23 247 lb 3.2 oz (112.1 kg)  02/26/23 247 lb 3.2 oz (112.1 kg)  12/14/22 242 lb (109.8 kg)    General: Patient appears comfortable at rest. HEENT: Conjunctiva and lids normal. Neck: Supple, no elevated JVP or carotid bruits. Lungs: Clear to auscultation, nonlabored breathing at rest. Cardiac: Irregularly irregular, 1-2/6 systolic murmur. Extremities: No pitting edema.  ECG:  An ECG dated 12/14/2022 was personally reviewed today and demonstrated:  Rate controlled atrial fibrillation.  Labwork:  February 2024: Cholesterol 145, triglycerides 94, HDL 51, LDL 75 June 2024: Hemoglobin 14.6, platelets 252, potassium 4.8, AST 20, ALT 17, BUN 12, creatinine 0.96 July  2024: Hemoglobin 13.3, platelets 288, potassium 3.5, BUN 11, AST 20, ALT 18, creatinine 1.02  Other Studies Reviewed Today:  No interval cardiac testing for review today.  Assessment and Plan:  1.  Permanent atrial fibrillation with CHA2DS2-VASc score of 4.  He is not anticoagulated as per prior discussions.  Heart rate in the low 60s, plan to cut Toprol-XL back to 12.5 mg daily in case this is causing any of his fatigue.   2.  Mild degenerative, calcific aortic stenosis with mean gradient 10 mmHg by echocardiogram in February 2022.  Plan on follow-up echocardiogram for reevaluation of LVEF and valvular status.   3.  Coronary artery calcification by CT imaging.  Continue Pravachol.  No obvious angina.  4.  History of hypokalemia.  Lasix discontinued however his potassium is low normal.  For now would resume KCl 20 mg daily and recheck BMET in 2 weeks.  He does correlate some of his symptoms to discontinuation of supplementation.  Disposition:  Follow up  3 months.  Signed, Jonelle Sidle, M.D., F.A.C.C. [facility] at [facility]

## 2023-05-13 DIAGNOSIS — I10 Essential (primary) hypertension: Secondary | ICD-10-CM | POA: Diagnosis not present

## 2023-05-13 DIAGNOSIS — J920 Pleural plaque with presence of asbestos: Secondary | ICD-10-CM | POA: Diagnosis not present

## 2023-05-13 DIAGNOSIS — E785 Hyperlipidemia, unspecified: Secondary | ICD-10-CM | POA: Diagnosis not present

## 2023-05-13 DIAGNOSIS — I4819 Other persistent atrial fibrillation: Secondary | ICD-10-CM | POA: Diagnosis not present

## 2023-05-13 DIAGNOSIS — E871 Hypo-osmolality and hyponatremia: Secondary | ICD-10-CM | POA: Diagnosis not present

## 2023-05-17 DIAGNOSIS — Z79899 Other long term (current) drug therapy: Secondary | ICD-10-CM | POA: Diagnosis not present

## 2023-05-20 ENCOUNTER — Ambulatory Visit: Payer: Medicare Other

## 2023-05-20 DIAGNOSIS — I350 Nonrheumatic aortic (valve) stenosis: Secondary | ICD-10-CM

## 2023-05-20 LAB — ECHOCARDIOGRAM COMPLETE
AR max vel: 1.72 cm2
AV Area VTI: 1.48 cm2
AV Area mean vel: 1.66 cm2
AV Mean grad: 10 mmHg
AV Peak grad: 20.3 mmHg
Ao pk vel: 2.25 m/s
Calc EF: 59.5 %
MV VTI: 2.15 cm2
S' Lateral: 3.7 cm
Single Plane A2C EF: 58.8 %
Single Plane A4C EF: 57.1 %

## 2023-05-29 ENCOUNTER — Telehealth: Payer: Self-pay | Admitting: Cardiology

## 2023-05-29 NOTE — Telephone Encounter (Signed)
-----   Message from Julian Fowler sent at 05/27/2023  8:05 PM EDT ----- Results reviewed.  Follow-up lab work shows potassium in normal range at 4.2.  Would continue with supplementation.

## 2023-05-29 NOTE — Telephone Encounter (Signed)
Patient's spouse is calling to follow up on lab results.

## 2023-05-29 NOTE — Telephone Encounter (Signed)
Patient's wife informed. Copy sent to PCP 

## 2023-05-30 ENCOUNTER — Ambulatory Visit: Payer: Medicare Other | Admitting: Cardiology

## 2023-06-11 ENCOUNTER — Other Ambulatory Visit: Payer: Self-pay | Admitting: Urology

## 2023-06-11 DIAGNOSIS — N390 Urinary tract infection, site not specified: Secondary | ICD-10-CM

## 2023-06-14 DIAGNOSIS — I10 Essential (primary) hypertension: Secondary | ICD-10-CM | POA: Diagnosis not present

## 2023-06-14 DIAGNOSIS — G5790 Unspecified mononeuropathy of unspecified lower limb: Secondary | ICD-10-CM | POA: Diagnosis not present

## 2023-06-14 NOTE — Progress Notes (Deleted)
Eligard SubQ Injection   Due to Prostate Cancer patient is present today for a Eligard Injection.  Medication: Eligard *** month Dose: *** mg  Location: {RIGHT/LEFT:20294}  Lot: *** Exp: ***  Patient tolerated well, {dnt complications:20057}  Performed by: ***  Per Dr. Marland Kitchen patient is to continue therapy for {Time; 3 months to 1 year:10399} . Patient's next follow up was scheduled for ***. This appointment was scheduled using wheel and given to patient today along with reminder continue on Vitamin D 800-1000iu and Calcium 1000-'1200mg'$  daily while on Androgen Deprivation Therapy.  PA approval dates:

## 2023-06-18 DIAGNOSIS — M79672 Pain in left foot: Secondary | ICD-10-CM | POA: Diagnosis not present

## 2023-06-18 DIAGNOSIS — I739 Peripheral vascular disease, unspecified: Secondary | ICD-10-CM | POA: Diagnosis not present

## 2023-06-18 DIAGNOSIS — M79671 Pain in right foot: Secondary | ICD-10-CM | POA: Diagnosis not present

## 2023-06-18 DIAGNOSIS — M79674 Pain in right toe(s): Secondary | ICD-10-CM | POA: Diagnosis not present

## 2023-06-18 DIAGNOSIS — L110 Acquired keratosis follicularis: Secondary | ICD-10-CM | POA: Diagnosis not present

## 2023-06-18 DIAGNOSIS — M79675 Pain in left toe(s): Secondary | ICD-10-CM | POA: Diagnosis not present

## 2023-06-22 ENCOUNTER — Other Ambulatory Visit: Payer: Self-pay | Admitting: Internal Medicine

## 2023-06-22 ENCOUNTER — Other Ambulatory Visit: Payer: Self-pay | Admitting: Cardiology

## 2023-06-24 NOTE — Telephone Encounter (Signed)
Lorazepam refilled

## 2023-06-26 ENCOUNTER — Ambulatory Visit (INDEPENDENT_AMBULATORY_CARE_PROVIDER_SITE_OTHER): Payer: Medicare Other | Admitting: Urology

## 2023-06-26 VITALS — BP 180/84 | HR 69

## 2023-06-26 DIAGNOSIS — N138 Other obstructive and reflux uropathy: Secondary | ICD-10-CM

## 2023-06-26 DIAGNOSIS — N401 Enlarged prostate with lower urinary tract symptoms: Secondary | ICD-10-CM

## 2023-06-26 DIAGNOSIS — N3281 Overactive bladder: Secondary | ICD-10-CM | POA: Diagnosis not present

## 2023-06-26 DIAGNOSIS — R3912 Poor urinary stream: Secondary | ICD-10-CM

## 2023-06-26 MED ORDER — SILODOSIN 8 MG PO CAPS
8.0000 mg | ORAL_CAPSULE | Freq: Every day | ORAL | 11 refills | Status: DC
Start: 2023-06-26 — End: 2023-12-22

## 2023-06-26 NOTE — Progress Notes (Signed)
06/26/2023 3:00 PM   Julian Fowler October 31, 1934 [id number]  Referring provider: Juliette Alcide, MD [address] [place],  [place] [address]  Followup BPH and nocturia   HPI: Julian Fowler is a 88yo here for followup for OAB and BPH. IPSS 10 QOL 2 on rapalfo 8mg  daily. Urine stream strong. No straining to urinate. Nocturia 1-3x. Rapaflo 8mg  improved his nocturia and urination. He drinks 3-4 diet pepsis per day. He has urinary urgency and rare urge incontinence. He is off his lasix. He is not bothered by urination.    PMH: Past Medical History:  Diagnosis Date   Allergic rhinitis    Aortic stenosis    Arthritis    Atrial fibrillation (HCC)    BPH (benign prostatic hyperplasia)    Coronary artery calcification seen on CT scan    Easy bruisability    ED (erectile dysfunction)    Essential hypertension    GERD (gastroesophageal reflux disease)    H/O hiatal hernia    Hearing loss in left ear    History of asbestosis    History of shingles    Hyperlipidemia    Overactive bladder    PSVT (paroxysmal supraventricular tachycardia)    Sleep apnea    No longer on CPAP following weight loss   Tinnitus     Surgical History: Past Surgical History:  Procedure Laterality Date   BACK SURGERY  2014   herniated L1, L2   Dr Channing Mutters   BRAIN SURGERY     CEREBRAL EMBOLIZATION  12/2011   "radiation therapy-did not work"   CHOLECYSTECTOMY  6/98   COLONOSCOPY  09/2009   Dr. Cleotis Nipper: normal, internal hemorrhoids    COLONOSCOPY N/A 02/28/2017   Dr. Jena Gauss: Hemorrhoids, grade 3, mild diverticulosis.   CRANIECTOMY FOR EXCISION OF ACOUSTIC NEUROMA  3/95   MINOR AMPUTATION OF DIGIT Left 12/31/2018   Procedure: REVISION AMPUTATION OF LEFT INDEX FINGER, IRRIGATION AND DEBRIDEMENT LEFT INDEX FINGER;  Surgeon: Ernest Mallick, MD;  Location: [facility] OR;  Service: Orthopedics;  Laterality: Left;   RADIOLOGY WITH ANESTHESIA N/A 07/07/2014   Procedure: EMBOLIZATION;  Surgeon: Oneal Grout, MD;   Location: [facility] OR;  Service: Radiology;  Laterality: N/A;   TOTAL KNEE ARTHROPLASTY Left 07/06/2013   Procedure: LEFT TOTAL KNEE ARTHROPLASTY;  Surgeon: Loanne Drilling, MD;  Location: [facility];  Service: Orthopedics;  Laterality: Left;   TOTAL KNEE ARTHROPLASTY Right 01/18/2014   Procedure: RIGHT TOTAL KNEE ARTHROPLASTY;  Surgeon: Loanne Drilling, MD;  Location: [facility];  Service: Orthopedics;  Laterality: Right;   TRIGGER FINGER RELEASE  2003   (thumb) middle finger (2006)    Home Medications:  Allergies as of 06/26/2023       Reactions   Latex Itching   Other    Other Reaction(s): Other Skin tears, use paper tape   Bactrim [sulfamethoxazole-trimethoprim]    Prednisone    Pt is high functioning and out of sorts.   Codeine Other (See Comments)   REACTION: groggy   Tape Other (See Comments)   Skin tears, use paper tape        Medication List        Accurate as of June 26, 2023  3:00 PM. If you have any questions, ask your nurse or doctor.          acetaminophen 325 MG tablet Commonly known as: TYLENOL Take 325 mg by mouth as needed.   azelastine 0.1 % nasal spray Commonly known as:  ASTELIN 1-2 puffs each nostril every 8 hours if needed for drainage What changed:  when to take this reasons to take this   Benefiber Powd Take 1 packet by mouth 2 (two) times daily.   CENTRUM SILVER PO Take 1 tablet by mouth daily.   diclofenac Sodium 1 % Gel Commonly known as: VOLTAREN Apply 2 g topically daily as needed (For pain).   esomeprazole 40 MG capsule Commonly known as: NEXIUM TAKE 1 CAPSULE BY MOUTH TWICE DAILY BEFORE A MEAL   fexofenadine 180 MG tablet Commonly known as: ALLEGRA Take 180 mg by mouth daily as needed for allergies.   fluticasone 50 MCG/ACT nasal spray Commonly known as: FLONASE Place 2 sprays into both nostrils as needed for allergies or rhinitis.   gabapentin 100 MG capsule Commonly known as: NEURONTIN Take 100 mg by mouth 2 (two) times  daily.   hydrALAZINE 50 MG tablet Commonly known as: APRESOLINE TAKE 1 & 1/2 (ONE & ONE-HALF) TABLETS BY MOUTH THREE TIMES DAILY   latanoprost 0.005 % ophthalmic solution Commonly known as: XALATAN Place 1 drop into both eyes at bedtime.   LORazepam 1 MG tablet Commonly known as: ATIVAN TAKE 1 TABLET BY MOUTH THREE TIMES DAILY AS NEEDED FOR ANXIETY   losartan 100 MG tablet Commonly known as: COZAAR Take 100 mg by mouth every morning.   metoprolol succinate 25 MG 24 hr tablet Commonly known as: TOPROL-XL Take 0.5 tablets (12.5 mg total) by mouth daily.   nitrofurantoin 50 MG capsule Commonly known as: MACRODANTIN Take 1 capsule by mouth at bedtime   Os-Cal Ultra 600 MG Tabs Take 1 tablet by mouth daily.   polyethylene glycol 17 g packet Commonly known as: MIRALAX / GLYCOLAX Take 17 g by mouth at bedtime.   potassium chloride SA 20 MEQ tablet Commonly known as: KLOR-CON M Take 1 tablet (20 mEq total) by mouth daily.   pravastatin 40 MG tablet Commonly known as: PRAVACHOL Take 1 tablet by mouth at bedtime.   ProAir HFA 108 (90 Base) MCG/ACT inhaler Generic drug: albuterol INHALE 2 PUFFS BY MOUTH EVERY 6 HOURS AS NEEDED FOR WHEEZING FOR SHORTNESS OF BREATH What changed: See the new instructions.   silodosin 8 MG Caps capsule Commonly known as: RAPAFLO Take 1 capsule (8 mg total) by mouth at bedtime.   sodium chloride 1 g tablet Take 1 g by mouth 3 (three) times daily.   Symbicort 160-4.5 MCG/ACT inhaler Generic drug: budesonide-formoterol INHALE 2 PUFFS BY MOUTH  TWICE DAILY THEN  RINSE  MOUTH   SYSTANE OP Place 1 drop into both eyes daily.   tadalafil 20 MG tablet Commonly known as: CIALIS Take 1 tablet (20 mg total) by mouth daily as needed.   Zinc 25 MG Tabs Take 1 tablet by mouth daily.   zolpidem 10 MG tablet Commonly known as: AMBIEN Take 1 tablet by mouth at bedtime.        Allergies:  Allergies  Allergen Reactions   Latex Itching    Other     Other Reaction(s): Other  Skin tears, use paper tape   Bactrim [Sulfamethoxazole-Trimethoprim]    Prednisone     Pt is high functioning and out of sorts.   Codeine Other (See Comments)    REACTION: groggy   Tape Other (See Comments)    Skin tears, use paper tape    Family History: Family History  Problem Relation Age of Onset   Cancer Father        oral  cancer   Breast cancer Mother    Heart attack Mother    Hypertension Sister        Bypass x4   Alcohol abuse Sister    Alzheimer's disease Sister    Kidney disease Sister    Colon cancer Neg Hx     Social History:  reports that he quit smoking about 36 years ago. His smoking use included cigarettes. He started smoking about 66 years ago. He has a 45 pack-year smoking history. He has never been exposed to tobacco smoke. He has never used smokeless tobacco. He reports that he does not drink alcohol and does not use drugs.  ROS: All other review of systems were reviewed and are negative except what is noted above in HPI  Physical Exam: BP (!) 180/84   Pulse 69   Constitutional:  Alert and oriented, No acute distress. HEENT: Brushy AT, moist mucus membranes.  Trachea midline, no masses. Cardiovascular: No clubbing, cyanosis, or edema. Respiratory: Normal respiratory effort, no increased work of breathing. GI: Abdomen is soft, nontender, nondistended, no abdominal masses GU: No CVA tenderness.  Lymph: No cervical or inguinal lymphadenopathy. Skin: No rashes, bruises or suspicious lesions. Neurologic: Grossly intact, no focal deficits, moving all 4 extremities. Psychiatric: Normal mood and affect.  Laboratory Data: Lab Results  Component Value Date   WBC 7.1 11/28/2020   HGB 11.3 (L) 11/28/2020   HCT 32.9 (L) 11/28/2020   MCV 90.6 11/28/2020   PLT 450 (H) 11/28/2020    Lab Results  Component Value Date   CREATININE 0.68 11/28/2020    Lab Results  Component Value Date   PSA 3.60 01/06/2009   PSA 3.05  03/25/2007    No results found for: "TESTOSTERONE"  No results found for: "HGBA1C"  Urinalysis    Component Value Date/Time   COLORURINE STRAW (A) 11/21/2020 1230   APPEARANCEUR Cloudy (A) 03/25/2023 1526   LABSPEC 1.005 11/21/2020 1230   PHURINE 7.0 11/21/2020 1230   GLUCOSEU Negative 03/25/2023 1526   HGBUR NEGATIVE 11/21/2020 1230   HGBUR negative 10/27/2008 0838   BILIRUBINUR Negative 03/25/2023 1526   KETONESUR NEGATIVE 11/21/2020 1230   PROTEINUR Negative 03/25/2023 1526   PROTEINUR NEGATIVE 11/21/2020 1230   UROBILINOGEN 0.2 01/11/2014 1405   NITRITE Negative 03/25/2023 1526   NITRITE NEGATIVE 11/21/2020 1230   LEUKOCYTESUR 3+ (A) 03/25/2023 1526   LEUKOCYTESUR NEGATIVE 11/21/2020 1230    Lab Results  Component Value Date   LABMICR See below: 03/25/2023   WBCUA >30 (A) 03/25/2023   LABEPIT 0-10 03/25/2023   MUCUS Present 02/26/2022   BACTERIA Few (A) 03/25/2023    Pertinent Imaging:  No results found for this or any previous visit.  No results found for this or any previous visit.  No results found for this or any previous visit.  No results found for this or any previous visit.  No results found for this or any previous visit.  No valid procedures specified. No results found for this or any previous visit.  No results found for this or any previous visit.   Assessment & Plan:    1. Benign prostatic hyperplasia with urinary obstruction -continue rapaflo 8mg  daily - Urinalysis, Routine w reflex microscopic  2. Weak urinary stream -continue rapalfo 8mg  daily  3. Overactive bladder -patient defers therapy at this time.    No follow-ups on file.  Wilkie Aye, MD  [facility] [place]

## 2023-06-27 LAB — MICROSCOPIC EXAMINATION

## 2023-06-27 LAB — URINALYSIS, ROUTINE W REFLEX MICROSCOPIC
Bilirubin, UA: NEGATIVE
Glucose, UA: NEGATIVE
Ketones, UA: NEGATIVE
Nitrite, UA: NEGATIVE
RBC, UA: NEGATIVE
Specific Gravity, UA: 1.015 (ref 1.005–1.030)
Urobilinogen, Ur: 0.2 mg/dL (ref 0.2–1.0)
pH, UA: 7 (ref 5.0–7.5)

## 2023-07-02 ENCOUNTER — Encounter: Payer: Self-pay | Admitting: Urology

## 2023-07-02 NOTE — Patient Instructions (Signed)

## 2023-07-04 DIAGNOSIS — E7849 Other hyperlipidemia: Secondary | ICD-10-CM | POA: Diagnosis not present

## 2023-07-04 DIAGNOSIS — E876 Hypokalemia: Secondary | ICD-10-CM | POA: Diagnosis not present

## 2023-07-04 DIAGNOSIS — I10 Essential (primary) hypertension: Secondary | ICD-10-CM | POA: Diagnosis not present

## 2023-07-08 ENCOUNTER — Other Ambulatory Visit: Payer: Self-pay | Admitting: Urology

## 2023-07-08 DIAGNOSIS — N390 Urinary tract infection, site not specified: Secondary | ICD-10-CM

## 2023-07-11 DIAGNOSIS — I4819 Other persistent atrial fibrillation: Secondary | ICD-10-CM | POA: Diagnosis not present

## 2023-07-11 DIAGNOSIS — Z6836 Body mass index (BMI) 36.0-36.9, adult: Secondary | ICD-10-CM | POA: Diagnosis not present

## 2023-07-11 DIAGNOSIS — R2681 Unsteadiness on feet: Secondary | ICD-10-CM | POA: Diagnosis not present

## 2023-07-11 DIAGNOSIS — J309 Allergic rhinitis, unspecified: Secondary | ICD-10-CM | POA: Diagnosis not present

## 2023-07-11 DIAGNOSIS — E871 Hypo-osmolality and hyponatremia: Secondary | ICD-10-CM | POA: Diagnosis not present

## 2023-07-11 DIAGNOSIS — Z23 Encounter for immunization: Secondary | ICD-10-CM | POA: Diagnosis not present

## 2023-07-11 DIAGNOSIS — M48061 Spinal stenosis, lumbar region without neurogenic claudication: Secondary | ICD-10-CM | POA: Diagnosis not present

## 2023-07-11 DIAGNOSIS — I10 Essential (primary) hypertension: Secondary | ICD-10-CM | POA: Diagnosis not present

## 2023-07-11 DIAGNOSIS — G5790 Unspecified mononeuropathy of unspecified lower limb: Secondary | ICD-10-CM | POA: Diagnosis not present

## 2023-07-16 DIAGNOSIS — H6122 Impacted cerumen, left ear: Secondary | ICD-10-CM | POA: Diagnosis not present

## 2023-07-16 DIAGNOSIS — Z6836 Body mass index (BMI) 36.0-36.9, adult: Secondary | ICD-10-CM | POA: Diagnosis not present

## 2023-07-16 DIAGNOSIS — R030 Elevated blood-pressure reading, without diagnosis of hypertension: Secondary | ICD-10-CM | POA: Diagnosis not present

## 2023-08-03 ENCOUNTER — Other Ambulatory Visit: Payer: Self-pay | Admitting: Urology

## 2023-08-03 DIAGNOSIS — N390 Urinary tract infection, site not specified: Secondary | ICD-10-CM

## 2023-08-10 NOTE — Progress Notes (Signed)
HPI male former smoker followed for allergic rhinitis, asthma,  history OSA, asbestos exposure/plaques, chronic insomnia Office spirometry 05/03/15- WNL- FVC 3.33/ 78%, FEV1 2.49/ 78%, r 0.75, FEF25-75% 1.92/ 69%.. -----------------------------------------------------------------------------------------------   08/09/22- 88 year old male Former Smoker followed for allergic Rhinitis, Asthma,  history OSA/ weight loss, Asbestos exposure/plaques/ interstitial lung disease, chronic insomnia, Diverticulitis, SVT, AFib, HTN, GERD, Hyponatremia,  -Azelastine nasal, Symbicort 160, Flonase, ProAir hfa,  Covid vax - 1 Phizer, 2 Moderna Flu vax-had Body weight today-243 lbs -----Pt states he has had a lot of chest congestion over the last 3 weeks Doesn't feel ill. Sinuses ok. Meds ok. Productive cough clear mucus. Occasional falls, has walker and cane, but wife often steadies him. His doctors are aware. Headaches resolved.  08/12/23- 88 year old male Former Smoker(45 pk yrs) followed for allergic Rhinitis, Asthma,  history OSA/ weight loss, Asbestos exposure/plaques/ interstitial lung disease, chronic insomnia, complicated by Diverticulitis, SVT, AFib, HTN, GERD, Hyponatremia, Peripheral Vascular Disease, DM2,  -Azelastine nasal, Symbicort 160, Flonase, ProAir hfa,  Body weight today-239 lbs -----Breathing has been good C/O clear nasal drainage. Reviewed meds.Chest feels ok. We will update CXR for hx asbestos exposure. Had flu vax. CXR 08/09/22-  IMPRESSION: 1. No acute cardiopulmonary process. 2. Calcified pleural plaques consistent with asbestosis.  ROS See HPI  + = positive Constitutional:   No-   weight loss, night sweats, fevers, chills, fatigue, lassitude. HEENT:   + headaches,  No -difficulty swallowing, tooth/dental problems, sore throat,       No-  sneezing, itching, ear ache,  +nasal congestion, +post nasal drip,  CV:  + chest pain, orthopnea, PND, swelling in lower extremities,  no-anasarca, dizziness, palpitations Resp: No-   shortness of breath with exertion or at rest.                productive cough,  No non-productive cough,  No-  coughing up of blood.              No-   change in color of mucus.  Occ wheezing.   Skin: No-   rash or lesions. GI:  No-   heartburn, indigestion, abdominal pain, nausea, vomiting,             GU:  MS:  + joint pain or swelling. .  + back pain. Neuro-  nothing unusual  Psych:  No- change in mood or affect. No depression or anxiety.  No memory loss.  OBJ  General- Alert, Oriented, Affect-appropriate, Distress- none acute, + obese,  Skin- rash-none, lesions- none, excoriation- none,  Lymphadenopathy- none Head- atraumatic            Eyes- Gross vision intact, PERRLA, conjunctivae clear secretions            Ears- +hard of hearing/hearing aid            Nose- + turbinate edema, no-Septal dev, mucus, polyps, erosion, perforation             Throat- Mallampati II , mucosa clear , drainage- none, tonsils- atrophic. Dental repair. Neck- flexible , trachea midline, no stridor , thyroid nl, carotid no bruit Chest - symmetrical excursion , unlabored           Heart/CV-  +IRR (hx AFib) , +1/6 S murmur , no gallop  , no rub, nl s1 s2                           - JVD- none , edema +  chronic right lower leg, varices- none           Lungs-  clear,  dullness-none, rub- none.            Chest wall- no rub heard Abd-  Br/ Gen/ Rectal- Not done, not indicated Extrem-  Neuro- grossly intact to observation. Speech is clear.

## 2023-08-12 ENCOUNTER — Encounter: Payer: Self-pay | Admitting: Internal Medicine

## 2023-08-12 ENCOUNTER — Ambulatory Visit: Payer: Medicare Other

## 2023-08-12 ENCOUNTER — Ambulatory Visit: Payer: Medicare Other | Admitting: Internal Medicine

## 2023-08-12 VITALS — BP 138/70 | HR 71 | Ht 69.5 in | Wt 239.0 lb

## 2023-08-12 DIAGNOSIS — J4520 Mild intermittent asthma, uncomplicated: Secondary | ICD-10-CM

## 2023-08-12 DIAGNOSIS — I10 Essential (primary) hypertension: Secondary | ICD-10-CM | POA: Diagnosis not present

## 2023-08-12 DIAGNOSIS — I6523 Occlusion and stenosis of bilateral carotid arteries: Secondary | ICD-10-CM | POA: Diagnosis not present

## 2023-08-12 DIAGNOSIS — J61 Pneumoconiosis due to asbestos and other mineral fibers: Secondary | ICD-10-CM | POA: Diagnosis not present

## 2023-08-12 DIAGNOSIS — J300 Vasomotor rhinitis: Secondary | ICD-10-CM | POA: Diagnosis not present

## 2023-08-12 DIAGNOSIS — J929 Pleural plaque without asbestos: Secondary | ICD-10-CM | POA: Diagnosis not present

## 2023-08-12 DIAGNOSIS — W108XXA Fall (on) (from) other stairs and steps, initial encounter: Secondary | ICD-10-CM | POA: Diagnosis not present

## 2023-08-12 DIAGNOSIS — R220 Localized swelling, mass and lump, head: Secondary | ICD-10-CM | POA: Diagnosis not present

## 2023-08-12 DIAGNOSIS — Z87891 Personal history of nicotine dependence: Secondary | ICD-10-CM | POA: Diagnosis not present

## 2023-08-12 DIAGNOSIS — S0003XA Contusion of scalp, initial encounter: Secondary | ICD-10-CM | POA: Diagnosis not present

## 2023-08-12 DIAGNOSIS — J449 Chronic obstructive pulmonary disease, unspecified: Secondary | ICD-10-CM | POA: Diagnosis not present

## 2023-08-12 DIAGNOSIS — Z885 Allergy status to narcotic agent status: Secondary | ICD-10-CM | POA: Diagnosis not present

## 2023-08-12 DIAGNOSIS — Z91040 Latex allergy status: Secondary | ICD-10-CM | POA: Diagnosis not present

## 2023-08-12 DIAGNOSIS — I700 Atherosclerosis of aorta: Secondary | ICD-10-CM | POA: Diagnosis not present

## 2023-08-12 NOTE — Patient Instructions (Signed)
Try using your Astelin nasal spray every night for a week and see if it can help control the postnasal drip.  You can also try changing your antihistamine pill from Allegra to either claritin or zyrtec to see if they work any better.  Order- CXR  dx Asthma/ COPD, Asbestos plaques

## 2023-08-14 ENCOUNTER — Encounter: Payer: Self-pay | Admitting: Cardiology

## 2023-08-14 ENCOUNTER — Ambulatory Visit: Payer: Medicare Other | Attending: Cardiology | Admitting: Cardiology

## 2023-08-14 VITALS — BP 142/70 | HR 56 | Ht 69.5 in | Wt 239.0 lb

## 2023-08-14 DIAGNOSIS — I4821 Permanent atrial fibrillation: Secondary | ICD-10-CM | POA: Diagnosis not present

## 2023-08-14 DIAGNOSIS — I350 Nonrheumatic aortic (valve) stenosis: Secondary | ICD-10-CM

## 2023-08-14 NOTE — Patient Instructions (Signed)
Medication Instructions:  STOP Metoprolol   Labwork: None today  Testing/Procedures: None today  Follow-Up: 3 months [place] office with Dr.McDowell  Any Other Special Instructions Will Be Listed Below (If Applicable).  If you need a refill on your cardiac medications before your next appointment, please call your pharmacy.

## 2023-08-14 NOTE — Progress Notes (Signed)
Cardiology Office Note  Date: 08/14/2023   ID: HARVEST BOSMAN, DOB 1935-07-22, MRN [id number]  History of Present Illness: Julian Fowler is an 88 y.o. male last seen in July.  He is here today with his wife for a follow-up visit. Records indicate ER visit at [facility] on October 28 after a fall.  He was stepping up onto a curb at a restaurant, using his cane, put his second foot up but lost his balance and fell back, hit his head.  His wife was with him.  Head CT reported no acute abnormality, small posterior scalp hematoma without fracture.  He did not require any stitches.  I reviewed his medications.  Current cardiovascular regimen includes hydralazine, Cozaar, Toprol-XL, Pravachol, and potassium supplement.  We discussed stopping Toprol-XL completely, we had already cut it back and he remains bradycardic.  I did review his interval lab work which is noted below.  Potassium normal.  Physical Exam: VS:  BP (!) 142/70   Pulse (!) 56   Ht 5' 9.5" (1.765 m)   Wt 239 lb (108.4 kg)   SpO2 96%   BMI 34.79 kg/m , BMI Body mass index is 34.79 kg/m.  Wt Readings from Last 3 Encounters:  08/14/23 239 lb (108.4 kg)  08/12/23 239 lb (108.4 kg)  05/08/23 247 lb 3.2 oz (112.1 kg)    General: Patient appears comfortable at rest. HEENT: Conjunctiva and lids normal. Neck: Supple, no elevated JVP or carotid bruits. Lungs: Clear to auscultation, nonlabored breathing at rest. Cardiac: Irregular irregular, 2/6 systolic murmur. Extremities: Mild right leg edema.  ECG:  An ECG dated 12/14/2022 was personally reviewed today and demonstrated:  Atrial fibrillation.  Labwork:  August 2024: Sodium 134, potassium 4.2, BUN 11, creatinine 0.9  Other Studies Reviewed Today:  Echocardiogram 05/20/2023:  1. Left ventricular ejection fraction, by estimation, is 55 to 60%. The  left ventricle has normal function. The left ventricle has no regional  wall motion abnormalities. There is moderate  concentric left ventricular  hypertrophy. Left ventricular  diastolic parameters are indeterminate.   2. Right ventricular systolic function is normal. The right ventricular  size is normal. There is mildly elevated pulmonary artery systolic  pressure. The estimated right ventricular systolic pressure is 39.7 mmHg.   3. Left atrial size was severely dilated.   4. Right atrial size was mildly dilated.   5. The mitral valve is degenerative. Mild mitral valve regurgitation.   6. The aortic valve is tricuspid. There is moderate calcification of the  aortic valve. Aortic valve regurgitation is not visualized. Mild aortic  valve stenosis. Aortic valve area, by VTI measures 1.48 cm. Aortic valve  mean gradient measures 10.0 mmHg.   7. Aortic dilatation noted. There is borderline dilatation of the aortic  root, measuring 40 mm. There is mild dilatation of the ascending aorta,  measuring 40 mm.   8. The inferior vena cava is normal in size with greater than 50%  respiratory variability, suggesting right atrial pressure of 3 mmHg.   Assessment and Plan:  1.  Permanent atrial fibrillation with CHA2DS2-VASc score of 4.  He is not anticoagulated as per prior discussions.  Remains bradycardic following reduction in Toprol-XL dose at last visit.  We will discontinue this completely.   2.  Mild degenerative, calcific aortic stenosis with mean AV gradient 10 mmHg by recent follow-up echocardiogram.  He is asymptomatic.   3.  Coronary artery calcification by CT imaging.  Continue Pravachol.  No  obvious angina.  4.  Recent mechanical fall without loss of consciousness.  He has chronic balance problems, using a cane.  His wife assists him when she can.  Disposition:  Follow up  3 months in the [place] office.  Signed, Jonelle Sidle, M.D., F.A.C.C. [facility] at [facility]

## 2023-08-16 ENCOUNTER — Other Ambulatory Visit: Payer: Self-pay | Admitting: Internal Medicine

## 2023-08-27 DIAGNOSIS — I739 Peripheral vascular disease, unspecified: Secondary | ICD-10-CM | POA: Diagnosis not present

## 2023-08-27 DIAGNOSIS — L110 Acquired keratosis follicularis: Secondary | ICD-10-CM | POA: Diagnosis not present

## 2023-08-27 DIAGNOSIS — M79671 Pain in right foot: Secondary | ICD-10-CM | POA: Diagnosis not present

## 2023-08-27 DIAGNOSIS — M79675 Pain in left toe(s): Secondary | ICD-10-CM | POA: Diagnosis not present

## 2023-08-27 DIAGNOSIS — M79672 Pain in left foot: Secondary | ICD-10-CM | POA: Diagnosis not present

## 2023-08-27 DIAGNOSIS — M79674 Pain in right toe(s): Secondary | ICD-10-CM | POA: Diagnosis not present

## 2023-09-08 ENCOUNTER — Encounter: Payer: Self-pay | Admitting: Internal Medicine

## 2023-09-08 DIAGNOSIS — J300 Vasomotor rhinitis: Secondary | ICD-10-CM | POA: Insufficient documentation

## 2023-09-08 NOTE — Assessment & Plan Note (Signed)
More likely vasomotor rather than allergic at his age Plan- reminded use of astelin.May need ipratropium.

## 2023-09-08 NOTE — Assessment & Plan Note (Signed)
Asbestos exposure and plaques, but little interstitial disease. Plan- continue surveillance

## 2023-09-16 ENCOUNTER — Encounter: Payer: Self-pay | Admitting: Internal Medicine

## 2023-09-16 ENCOUNTER — Encounter: Payer: Self-pay | Admitting: *Deleted

## 2023-09-16 ENCOUNTER — Ambulatory Visit (INDEPENDENT_AMBULATORY_CARE_PROVIDER_SITE_OTHER): Payer: Medicare Other | Admitting: Internal Medicine

## 2023-09-16 VITALS — BP 163/76 | HR 77 | Temp 97.9°F | Ht 69.0 in | Wt 241.0 lb

## 2023-09-16 DIAGNOSIS — R195 Other fecal abnormalities: Secondary | ICD-10-CM

## 2023-09-16 DIAGNOSIS — R1013 Epigastric pain: Secondary | ICD-10-CM | POA: Diagnosis not present

## 2023-09-16 DIAGNOSIS — K219 Gastro-esophageal reflux disease without esophagitis: Secondary | ICD-10-CM | POA: Diagnosis not present

## 2023-09-16 DIAGNOSIS — R110 Nausea: Secondary | ICD-10-CM | POA: Diagnosis not present

## 2023-09-16 NOTE — Progress Notes (Unsigned)
Primary Care Physician:  Juliette Alcide, MD Primary Gastroenterologist:  Dr. Jena Gauss  Pre-Procedure History & Physical: HPI:  Julian Fowler is a 88 y.o. male here for report of 5-day history of dark stools and some postprandial nausea dyspepsia but no hematemesis vomiting no hematochezia.  Took Pepto-Bismol day after he observed dark stools.  Current hemoglobin unknown.  He presents hemodynamically stable.  No anticoagulation.  No NSAIDs.  No prior upper GI bleeding.  No prior EGD.  Colonoscopy 2019 demonstrated internal hemorrhoids and left-sided diverticula.  Past Medical History:  Diagnosis Date   Allergic rhinitis    Aortic stenosis    Arthritis    Atrial fibrillation (HCC)    BPH (benign prostatic hyperplasia)    Coronary artery calcification seen on CT scan    Easy bruisability    ED (erectile dysfunction)    Essential hypertension    GERD (gastroesophageal reflux disease)    H/O hiatal hernia    Hearing loss in left ear    History of asbestosis    History of shingles    Hyperlipidemia    Overactive bladder    PSVT (paroxysmal supraventricular tachycardia) (HCC)    Sleep apnea    No longer on CPAP following weight loss   Tinnitus     Past Surgical History:  Procedure Laterality Date   BACK SURGERY  2014   herniated L1, L2   Dr Channing Mutters   BRAIN SURGERY     CEREBRAL EMBOLIZATION  12/2011   "radiation therapy-did not work"   CHOLECYSTECTOMY  6/98   COLONOSCOPY  09/2009   Dr. Cleotis Nipper: normal, internal hemorrhoids    COLONOSCOPY N/A 02/28/2017   Dr. Jena Gauss: Hemorrhoids, grade 3, mild diverticulosis.   CRANIECTOMY FOR EXCISION OF ACOUSTIC NEUROMA  3/95   MINOR AMPUTATION OF DIGIT Left 12/31/2018   Procedure: REVISION AMPUTATION OF LEFT INDEX FINGER, IRRIGATION AND DEBRIDEMENT LEFT INDEX FINGER;  Surgeon: Ernest Mallick, MD;  Location: [facility];  Service: Orthopedics;  Laterality: Left;   RADIOLOGY WITH ANESTHESIA N/A 07/07/2014   Procedure: EMBOLIZATION;   Surgeon: Oneal Grout, MD;  Location: [facility];  Service: Radiology;  Laterality: N/A;   TOTAL KNEE ARTHROPLASTY Left 07/06/2013   Procedure: LEFT TOTAL KNEE ARTHROPLASTY;  Surgeon: Loanne Drilling, MD;  Location: [facility];  Service: Orthopedics;  Laterality: Left;   TOTAL KNEE ARTHROPLASTY Right 01/18/2014   Procedure: RIGHT TOTAL KNEE ARTHROPLASTY;  Surgeon: Loanne Drilling, MD;  Location: [facility];  Service: Orthopedics;  Laterality: Right;   TRIGGER FINGER RELEASE  2003   (thumb) middle finger (2006)    Prior to Admission medications   Medication Sig Start Date End Date Taking? Authorizing Provider  acetaminophen (TYLENOL) 325 MG tablet Take 325 mg by mouth as needed.   Yes [provider]  azelastine (ASTELIN) 0.1 % nasal spray 1-2 puffs each nostril every 8 hours if needed for drainage Patient taking differently: as needed. 1-2 puffs each nostril every 8 hours if needed for drainage 12/17/17  Yes Young, Joni Fears D, MD  Calcium Carb-Vit D-C-E-Mineral (OS-CAL ULTRA) 600 MG TABS Take 1 tablet by mouth daily.    Yes [provider]  diclofenac Sodium (VOLTAREN) 1 % GEL Apply 2 g topically daily as needed (For pain). 03/08/20  Yes [provider]  esomeprazole (NEXIUM) 40 MG capsule TAKE 1 CAPSULE BY MOUTH TWICE DAILY BEFORE A MEAL 08/28/23  Yes Corbin Ade, MD  fexofenadine (ALLEGRA) 180 MG tablet Take 180  mg by mouth daily as needed for allergies.   Yes [provider]  fluticasone (FLONASE) 50 MCG/ACT nasal spray Place 2 sprays into both nostrils as needed for allergies or rhinitis.   Yes [provider]  hydrALAZINE (APRESOLINE) 50 MG tablet TAKE 1 & 1/2 (ONE & ONE-HALF) TABLETS BY MOUTH THREE TIMES DAILY 06/24/23  Yes Jonelle Sidle, MD  latanoprost (XALATAN) 0.005 % ophthalmic solution Place 1 drop into both eyes at bedtime.  09/01/12  Yes [provider]  LORazepam (ATIVAN) 1 MG tablet TAKE 1 TABLET BY MOUTH THREE TIMES DAILY AS NEEDED  FOR ANXIETY 06/24/23  Yes Young, Clinton D, MD  losartan (COZAAR) 100 MG tablet Take 100 mg by mouth every morning.    Yes [provider]  Multiple Vitamins-Minerals (CENTRUM SILVER PO) Take 1 tablet by mouth daily.    Yes [provider]  nitrofurantoin (MACRODANTIN) 50 MG capsule Take 1 capsule by mouth at bedtime 08/06/23  Yes McKenzie, Mardene Celeste, MD  Polyethyl Glycol-Propyl Glycol (SYSTANE OP) Place 1 drop into both eyes daily.   Yes [provider]  polyethylene glycol (MIRALAX / GLYCOLAX) packet Take 17 g by mouth at bedtime.   Yes [provider]  potassium chloride SA (KLOR-CON M) 20 MEQ tablet Take 1 tablet (20 mEq total) by mouth daily. 05/08/23  Yes Jonelle Sidle, MD  pravastatin (PRAVACHOL) 40 MG tablet Take 1 tablet by mouth at bedtime.  10/05/14  Yes [provider]  PROAIR HFA 108 (90 Base) MCG/ACT inhaler INHALE 2 PUFFS BY MOUTH EVERY 6 HOURS AS NEEDED FOR WHEEZING FOR SHORTNESS OF BREATH Patient taking differently: Inhale 1 puff into the lungs every 6 (six) hours as needed. 03/18/20  Yes Young, Joni Fears D, MD  silodosin (RAPAFLO) 8 MG CAPS capsule Take 1 capsule (8 mg total) by mouth at bedtime. 06/26/23  Yes McKenzie, Mardene Celeste, MD  sodium chloride 1 g tablet Take 1 g by mouth 3 (three) times daily. 02/08/21  Yes [provider]  SYMBICORT 160-4.5 MCG/ACT inhaler INHALE 2 PUFFS BY MOUTH  TWICE DAILY THEN  RINSE  MOUTH 01/31/21  Yes Young, Clinton D, MD  tadalafil (CIALIS) 20 MG tablet Take 1 tablet (20 mg total) by mouth daily as needed. 05/30/22  Yes McKenzie, Mardene Celeste, MD  Wheat Dextrin (BENEFIBER) POWD Take 1 packet by mouth 2 (two) times daily.   Yes [provider]  Zinc 25 MG TABS Take 1 tablet by mouth daily.   Yes [provider]  zolpidem (AMBIEN) 10 MG tablet Take 1 tablet by mouth at bedtime.  01/24/16  Yes [provider]    Allergies as of 09/16/2023 - Review Complete 09/08/2023  Allergen  Reaction Noted   Latex Itching 07/01/2019   Other  04/10/2012   Bactrim [sulfamethoxazole-trimethoprim]  12/27/2020   Prednisone  12/17/2017   Codeine Other (See Comments)    Tape Other (See Comments) 04/10/2012    Family History  Problem Relation Age of Onset   Cancer Father        oral cancer   Breast cancer Mother    Heart attack Mother    Hypertension Sister        Bypass x4   Alcohol abuse Sister    Alzheimer's disease Sister    Kidney disease Sister    Colon cancer Neg Hx     Social History   Socioeconomic History   Marital status: Married    Spouse name: Not on file  Number of children: Not on file   Years of education: Not on file   Highest education level: Not on file  Occupational History   Not on file  Tobacco Use   Smoking status: Former    Current packs/day: 0.00    Average packs/day: 1.5 packs/day for 30.0 years (45.0 ttl pk-yrs)    Types: Cigarettes    Start date: 04/24/1957    Quit date: 10/15/1986    Years since quitting: 36.9    Passive exposure: Never   Smokeless tobacco: Never  Vaping Use   Vaping status: Never Used  Substance and Sexual Activity   Alcohol use: No    Alcohol/week: 0.0 standard drinks of alcohol    Comment: Used to drink heavily at times   Drug use: No   Sexual activity: Not Currently  Other Topics Concern   Not on file  Social History Narrative   Co-dependent relationship with his 59 year old son who has drug and financial problems   Recently married to [place] on 01/02/2014   Social Determinants of Health   Financial Resource Strain: Not on file  Food Insecurity: Not on file  Transportation Needs: Not on file  Physical Activity: Not on file  Stress: Not on file  Social Connections: Unknown (06/08/2022)   Received from [facility], [facility]   Social Network    Social Network: Not on file  Intimate Partner Violence: Unknown (06/08/2022)   Received from [facility], [facility]   HITS    Physically Hurt: Not  on file    Insult or Talk Down To: Not on file    Threaten Physical Harm: Not on file    Scream or Curse: Not on file    Review of Systems: See HPI, otherwise negative ROS  Physical Exam: BP (!) 163/76 (BP Location: Right Arm, Patient Position: Sitting, Cuff Size: Large)   Pulse 77   Temp 97.9 F (36.6 C) (Oral)   Ht 5\' 9"  (1.753 m)   Wt 241 lb (109.3 kg)   SpO2 98%   BMI 35.59 kg/m  General:   Alert,  Well-developed, well-nourished, pleasant and cooperative in NAD Neck:  Supple; no masses or thyromegaly. No significant cervical adenopathy. Lungs:  Clear throughout to auscultation.   No wheezes, crackles, or rhonchi. No acute distress. Heart:  Regular rate and rhythm; no murmurs, clicks, rubs,  or gallops. Abdomen: Non-distended, normal bowel sounds.  Soft and nontender without appreciable mass or hepatosplenomegaly.  Rectal: Good sphincter tone.  No mass to rectal vault.  No stool in the rectal vault.  Mucus is Hemoccult negative.  Impression/Plan: 88 year old gentleman with a history of GERD on omeprazole twice daily for adequate control presents with a 1 week history of dark, dark stools some postprandial nausea but no vomiting took Pepto-Bismol the day after dark stool started.  No frank abdominal pain.  No hematochezia or hematemesis.  No prior history of upper GI bleeding denies NSAIDs.  No iron therapy. He appears hemodynamically very stable.  The rectal vault is empty mucus is Hemoccult negative today.  If he has indeed had a recent GI bleed more proximal GI tract etiology is likely.  We need to get a CBC and plan for an EGD in the near future.  Recommendations:  I want you to stay on a clear liquid diet until you hear from me  You need to have an EGD to look for signs of bleeding in your stomach.  ASA 3.  ASAP.  The risks, benefits, limitations, alternatives and imponderables have been reviewed with the patient. Questions have been answered. All parties are agreeable.     Continue Nexium 40 mg orally twice daily 30 minutes before meals  CBC today  Notice: This dictation was prepared with Dragon dictation along with smaller phrase technology. Any transcriptional errors that result from this process are unintentional and may not be corrected upon review.

## 2023-09-16 NOTE — Patient Instructions (Addendum)
It was nice to see you again today!  I want you to stay on a clear liquid diet until you hear from me  You need to have an EGD to look for signs of bleeding in your stomach.  ASA 3.  ASAP.  Continue Nexium 40 mg orally twice daily 30 minutes before meals  CBC today  I did not detect any blood in your stool today.  However, if you have more black stool in the near future or start to feel lightheaded, you should go to the emergency room  Further recommendations to follow.

## 2023-09-16 NOTE — H&P (View-Only) (Signed)
Primary Care Physician:  Juliette Alcide, MD Primary Gastroenterologist:  Dr. Jena Gauss  Pre-Procedure History & Physical: HPI:  Julian Fowler is a 88 y.o. male here for report of 5-day history of dark stools and some postprandial nausea dyspepsia but no hematemesis vomiting no hematochezia.  Took Pepto-Bismol day after he observed dark stools.  Current hemoglobin unknown.  He presents hemodynamically stable.  No anticoagulation.  No NSAIDs.  No prior upper GI bleeding.  No prior EGD.  Colonoscopy 2019 demonstrated internal hemorrhoids and left-sided diverticula.  Past Medical History:  Diagnosis Date   Allergic rhinitis    Aortic stenosis    Arthritis    Atrial fibrillation (HCC)    BPH (benign prostatic hyperplasia)    Coronary artery calcification seen on CT scan    Easy bruisability    ED (erectile dysfunction)    Essential hypertension    GERD (gastroesophageal reflux disease)    H/O hiatal hernia    Hearing loss in left ear    History of asbestosis    History of shingles    Hyperlipidemia    Overactive bladder    PSVT (paroxysmal supraventricular tachycardia) (HCC)    Sleep apnea    No longer on CPAP following weight loss   Tinnitus     Past Surgical History:  Procedure Laterality Date   BACK SURGERY  2014   herniated L1, L2   Dr Channing Mutters   BRAIN SURGERY     CEREBRAL EMBOLIZATION  12/2011   "radiation therapy-did not work"   CHOLECYSTECTOMY  6/98   COLONOSCOPY  09/2009   Dr. Cleotis Nipper: normal, internal hemorrhoids    COLONOSCOPY N/A 02/28/2017   Dr. Jena Gauss: Hemorrhoids, grade 3, mild diverticulosis.   CRANIECTOMY FOR EXCISION OF ACOUSTIC NEUROMA  3/95   MINOR AMPUTATION OF DIGIT Left 12/31/2018   Procedure: REVISION AMPUTATION OF LEFT INDEX FINGER, IRRIGATION AND DEBRIDEMENT LEFT INDEX FINGER;  Surgeon: Ernest Mallick, MD;  Location: [facility];  Service: Orthopedics;  Laterality: Left;   RADIOLOGY WITH ANESTHESIA N/A 07/07/2014   Procedure: EMBOLIZATION;   Surgeon: Oneal Grout, MD;  Location: [facility];  Service: Radiology;  Laterality: N/A;   TOTAL KNEE ARTHROPLASTY Left 07/06/2013   Procedure: LEFT TOTAL KNEE ARTHROPLASTY;  Surgeon: Loanne Drilling, MD;  Location: [facility];  Service: Orthopedics;  Laterality: Left;   TOTAL KNEE ARTHROPLASTY Right 01/18/2014   Procedure: RIGHT TOTAL KNEE ARTHROPLASTY;  Surgeon: Loanne Drilling, MD;  Location: [facility];  Service: Orthopedics;  Laterality: Right;   TRIGGER FINGER RELEASE  2003   (thumb) middle finger (2006)    Prior to Admission medications   Medication Sig Start Date End Date Taking? Authorizing Provider  acetaminophen (TYLENOL) 325 MG tablet Take 325 mg by mouth as needed.   Yes [provider]  azelastine (ASTELIN) 0.1 % nasal spray 1-2 puffs each nostril every 8 hours if needed for drainage Patient taking differently: as needed. 1-2 puffs each nostril every 8 hours if needed for drainage 12/17/17  Yes Young, Joni Fears D, MD  Calcium Carb-Vit D-C-E-Mineral (OS-CAL ULTRA) 600 MG TABS Take 1 tablet by mouth daily.    Yes [provider]  diclofenac Sodium (VOLTAREN) 1 % GEL Apply 2 g topically daily as needed (For pain). 03/08/20  Yes [provider]  esomeprazole (NEXIUM) 40 MG capsule TAKE 1 CAPSULE BY MOUTH TWICE DAILY BEFORE A MEAL 08/28/23  Yes Corbin Ade, MD  fexofenadine (ALLEGRA) 180 MG tablet Take 180  mg by mouth daily as needed for allergies.   Yes [provider]  fluticasone (FLONASE) 50 MCG/ACT nasal spray Place 2 sprays into both nostrils as needed for allergies or rhinitis.   Yes [provider]  hydrALAZINE (APRESOLINE) 50 MG tablet TAKE 1 & 1/2 (ONE & ONE-HALF) TABLETS BY MOUTH THREE TIMES DAILY 06/24/23  Yes Jonelle Sidle, MD  latanoprost (XALATAN) 0.005 % ophthalmic solution Place 1 drop into both eyes at bedtime.  09/01/12  Yes [provider]  LORazepam (ATIVAN) 1 MG tablet TAKE 1 TABLET BY MOUTH THREE TIMES DAILY AS NEEDED  FOR ANXIETY 06/24/23  Yes Young, Clinton D, MD  losartan (COZAAR) 100 MG tablet Take 100 mg by mouth every morning.    Yes [provider]  Multiple Vitamins-Minerals (CENTRUM SILVER PO) Take 1 tablet by mouth daily.    Yes [provider]  nitrofurantoin (MACRODANTIN) 50 MG capsule Take 1 capsule by mouth at bedtime 08/06/23  Yes McKenzie, Mardene Celeste, MD  Polyethyl Glycol-Propyl Glycol (SYSTANE OP) Place 1 drop into both eyes daily.   Yes [provider]  polyethylene glycol (MIRALAX / GLYCOLAX) packet Take 17 g by mouth at bedtime.   Yes [provider]  potassium chloride SA (KLOR-CON M) 20 MEQ tablet Take 1 tablet (20 mEq total) by mouth daily. 05/08/23  Yes Jonelle Sidle, MD  pravastatin (PRAVACHOL) 40 MG tablet Take 1 tablet by mouth at bedtime.  10/05/14  Yes [provider]  PROAIR HFA 108 (90 Base) MCG/ACT inhaler INHALE 2 PUFFS BY MOUTH EVERY 6 HOURS AS NEEDED FOR WHEEZING FOR SHORTNESS OF BREATH Patient taking differently: Inhale 1 puff into the lungs every 6 (six) hours as needed. 03/18/20  Yes Young, Joni Fears D, MD  silodosin (RAPAFLO) 8 MG CAPS capsule Take 1 capsule (8 mg total) by mouth at bedtime. 06/26/23  Yes McKenzie, Mardene Celeste, MD  sodium chloride 1 g tablet Take 1 g by mouth 3 (three) times daily. 02/08/21  Yes [provider]  SYMBICORT 160-4.5 MCG/ACT inhaler INHALE 2 PUFFS BY MOUTH  TWICE DAILY THEN  RINSE  MOUTH 01/31/21  Yes Young, Clinton D, MD  tadalafil (CIALIS) 20 MG tablet Take 1 tablet (20 mg total) by mouth daily as needed. 05/30/22  Yes McKenzie, Mardene Celeste, MD  Wheat Dextrin (BENEFIBER) POWD Take 1 packet by mouth 2 (two) times daily.   Yes [provider]  Zinc 25 MG TABS Take 1 tablet by mouth daily.   Yes [provider]  zolpidem (AMBIEN) 10 MG tablet Take 1 tablet by mouth at bedtime.  01/24/16  Yes [provider]    Allergies as of 09/16/2023 - Review Complete 09/08/2023  Allergen  Reaction Noted   Latex Itching 07/01/2019   Other  04/10/2012   Bactrim [sulfamethoxazole-trimethoprim]  12/27/2020   Prednisone  12/17/2017   Codeine Other (See Comments)    Tape Other (See Comments) 04/10/2012    Family History  Problem Relation Age of Onset   Cancer Father        oral cancer   Breast cancer Mother    Heart attack Mother    Hypertension Sister        Bypass x4   Alcohol abuse Sister    Alzheimer's disease Sister    Kidney disease Sister    Colon cancer Neg Hx     Social History   Socioeconomic History   Marital status: Married    Spouse name: Not on file  Number of children: Not on file   Years of education: Not on file   Highest education level: Not on file  Occupational History   Not on file  Tobacco Use   Smoking status: Former    Current packs/day: 0.00    Average packs/day: 1.5 packs/day for 30.0 years (45.0 ttl pk-yrs)    Types: Cigarettes    Start date: 04/24/1957    Quit date: 10/15/1986    Years since quitting: 36.9    Passive exposure: Never   Smokeless tobacco: Never  Vaping Use   Vaping status: Never Used  Substance and Sexual Activity   Alcohol use: No    Alcohol/week: 0.0 standard drinks of alcohol    Comment: Used to drink heavily at times   Drug use: No   Sexual activity: Not Currently  Other Topics Concern   Not on file  Social History Narrative   Co-dependent relationship with his 59 year old son who has drug and financial problems   Recently married to [place] on 01/02/2014   Social Determinants of Health   Financial Resource Strain: Not on file  Food Insecurity: Not on file  Transportation Needs: Not on file  Physical Activity: Not on file  Stress: Not on file  Social Connections: Unknown (06/08/2022)   Received from [facility], [facility]   Social Network    Social Network: Not on file  Intimate Partner Violence: Unknown (06/08/2022)   Received from [facility], [facility]   HITS    Physically Hurt: Not  on file    Insult or Talk Down To: Not on file    Threaten Physical Harm: Not on file    Scream or Curse: Not on file    Review of Systems: See HPI, otherwise negative ROS  Physical Exam: BP (!) 163/76 (BP Location: Right Arm, Patient Position: Sitting, Cuff Size: Large)   Pulse 77   Temp 97.9 F (36.6 C) (Oral)   Ht 5\' 9"  (1.753 m)   Wt 241 lb (109.3 kg)   SpO2 98%   BMI 35.59 kg/m  General:   Alert,  Well-developed, well-nourished, pleasant and cooperative in NAD Neck:  Supple; no masses or thyromegaly. No significant cervical adenopathy. Lungs:  Clear throughout to auscultation.   No wheezes, crackles, or rhonchi. No acute distress. Heart:  Regular rate and rhythm; no murmurs, clicks, rubs,  or gallops. Abdomen: Non-distended, normal bowel sounds.  Soft and nontender without appreciable mass or hepatosplenomegaly.  Rectal: Good sphincter tone.  No mass to rectal vault.  No stool in the rectal vault.  Mucus is Hemoccult negative.  Impression/Plan: 88 year old gentleman with a history of GERD on omeprazole twice daily for adequate control presents with a 1 week history of dark, dark stools some postprandial nausea but no vomiting took Pepto-Bismol the day after dark stool started.  No frank abdominal pain.  No hematochezia or hematemesis.  No prior history of upper GI bleeding denies NSAIDs.  No iron therapy. He appears hemodynamically very stable.  The rectal vault is empty mucus is Hemoccult negative today.  If he has indeed had a recent GI bleed more proximal GI tract etiology is likely.  We need to get a CBC and plan for an EGD in the near future.  Recommendations:  I want you to stay on a clear liquid diet until you hear from me  You need to have an EGD to look for signs of bleeding in your stomach.  ASA 3.  ASAP.  The risks, benefits, limitations, alternatives and imponderables have been reviewed with the patient. Questions have been answered. All parties are agreeable.     Continue Nexium 40 mg orally twice daily 30 minutes before meals  CBC today  Notice: This dictation was prepared with Dragon dictation along with smaller phrase technology. Any transcriptional errors that result from this process are unintentional and may not be corrected upon review.

## 2023-09-17 ENCOUNTER — Other Ambulatory Visit: Payer: Self-pay

## 2023-09-17 ENCOUNTER — Telehealth: Payer: Self-pay | Admitting: *Deleted

## 2023-09-17 ENCOUNTER — Encounter (HOSPITAL_COMMUNITY): Payer: Self-pay

## 2023-09-17 ENCOUNTER — Encounter (HOSPITAL_COMMUNITY)
Admission: RE | Admit: 2023-09-17 | Discharge: 2023-09-17 | Disposition: A | Discharge status: Home / Self Care | Payer: Medicare Other | Source: Ambulatory Visit | Attending: Internal Medicine | Admitting: Internal Medicine

## 2023-09-17 LAB — CBC WITH DIFFERENTIAL/PLATELET
Absolute Lymphocytes: 2418 {cells}/uL (ref 850–3900)
Absolute Monocytes: 983 {cells}/uL — ABNORMAL HIGH (ref 200–950)
Basophils Absolute: 62 {cells}/uL (ref 0–200)
Basophils Relative: 0.8 %
Eosinophils Absolute: 312 {cells}/uL (ref 15–500)
Eosinophils Relative: 4 %
HCT: 45.2 % (ref 38.5–50.0)
Hemoglobin: 15 g/dL (ref 13.2–17.1)
MCH: 30.7 pg (ref 27.0–33.0)
MCHC: 33.2 g/dL (ref 32.0–36.0)
MCV: 92.4 fL (ref 80.0–100.0)
MPV: 9.7 fL (ref 7.5–12.5)
Monocytes Relative: 12.6 %
Neutro Abs: 4025 {cells}/uL (ref 1500–7800)
Neutrophils Relative %: 51.6 %
Platelets: 355 10*3/uL (ref 140–400)
RBC: 4.89 10*6/uL (ref 4.20–5.80)
RDW: 13.9 % (ref 11.0–15.0)
Total Lymphocyte: 31 %
WBC: 7.8 10*3/uL (ref 3.8–10.8)

## 2023-09-17 NOTE — Telephone Encounter (Signed)
Called and spoke with patient spouse and she is aware of  pre-op appointment

## 2023-09-18 ENCOUNTER — Ambulatory Visit (HOSPITAL_COMMUNITY): Payer: Medicare Other | Admitting: Anesthesiology

## 2023-09-18 ENCOUNTER — Ambulatory Visit (HOSPITAL_COMMUNITY)
Admission: RE | Admit: 2023-09-18 | Discharge: 2023-09-18 | Disposition: A | Discharge status: Home / Self Care | Payer: Medicare Other | Attending: Internal Medicine | Admitting: Internal Medicine

## 2023-09-18 ENCOUNTER — Encounter (HOSPITAL_COMMUNITY)
Admission: RE | Disposition: A | Discharge status: Home / Self Care | Payer: Self-pay | Source: Home / Self Care | Attending: Internal Medicine

## 2023-09-18 DIAGNOSIS — G4730 Sleep apnea, unspecified: Secondary | ICD-10-CM | POA: Diagnosis not present

## 2023-09-18 DIAGNOSIS — K259 Gastric ulcer, unspecified as acute or chronic, without hemorrhage or perforation: Secondary | ICD-10-CM

## 2023-09-18 DIAGNOSIS — K3189 Other diseases of stomach and duodenum: Secondary | ICD-10-CM | POA: Diagnosis not present

## 2023-09-18 DIAGNOSIS — J4489 Other specified chronic obstructive pulmonary disease: Secondary | ICD-10-CM | POA: Insufficient documentation

## 2023-09-18 DIAGNOSIS — K219 Gastro-esophageal reflux disease without esophagitis: Secondary | ICD-10-CM | POA: Diagnosis not present

## 2023-09-18 DIAGNOSIS — Z79899 Other long term (current) drug therapy: Secondary | ICD-10-CM | POA: Insufficient documentation

## 2023-09-18 DIAGNOSIS — K2950 Unspecified chronic gastritis without bleeding: Secondary | ICD-10-CM | POA: Insufficient documentation

## 2023-09-18 DIAGNOSIS — J61 Pneumoconiosis due to asbestos and other mineral fibers: Secondary | ICD-10-CM | POA: Insufficient documentation

## 2023-09-18 DIAGNOSIS — K5730 Diverticulosis of large intestine without perforation or abscess without bleeding: Secondary | ICD-10-CM | POA: Insufficient documentation

## 2023-09-18 DIAGNOSIS — K2951 Unspecified chronic gastritis with bleeding: Secondary | ICD-10-CM | POA: Diagnosis not present

## 2023-09-18 DIAGNOSIS — K921 Melena: Secondary | ICD-10-CM | POA: Insufficient documentation

## 2023-09-18 DIAGNOSIS — I10 Essential (primary) hypertension: Secondary | ICD-10-CM | POA: Diagnosis not present

## 2023-09-18 DIAGNOSIS — K449 Diaphragmatic hernia without obstruction or gangrene: Secondary | ICD-10-CM | POA: Diagnosis not present

## 2023-09-18 DIAGNOSIS — I2510 Atherosclerotic heart disease of native coronary artery without angina pectoris: Secondary | ICD-10-CM | POA: Diagnosis not present

## 2023-09-18 DIAGNOSIS — Z87891 Personal history of nicotine dependence: Secondary | ICD-10-CM | POA: Insufficient documentation

## 2023-09-18 DIAGNOSIS — K317 Polyp of stomach and duodenum: Secondary | ICD-10-CM | POA: Diagnosis not present

## 2023-09-18 DIAGNOSIS — I471 Supraventricular tachycardia, unspecified: Secondary | ICD-10-CM | POA: Insufficient documentation

## 2023-09-18 DIAGNOSIS — J449 Chronic obstructive pulmonary disease, unspecified: Secondary | ICD-10-CM | POA: Diagnosis not present

## 2023-09-18 HISTORY — PX: POLYPECTOMY: SHX5525

## 2023-09-18 HISTORY — PX: BIOPSY: SHX5522

## 2023-09-18 HISTORY — PX: ESOPHAGOGASTRODUODENOSCOPY (EGD) WITH PROPOFOL: SHX5813

## 2023-09-18 SURGERY — ESOPHAGOGASTRODUODENOSCOPY (EGD) WITH PROPOFOL
Anesthesia: General

## 2023-09-18 MED ORDER — LIDOCAINE HCL (CARDIAC) PF 100 MG/5ML IV SOSY
PREFILLED_SYRINGE | INTRAVENOUS | Status: DC | PRN
  Administered 2023-09-18: 50 mg via INTRATRACHEAL

## 2023-09-18 MED ORDER — LACTATED RINGERS IV SOLN: INTRAVENOUS | Status: DC | PRN

## 2023-09-18 MED ORDER — STERILE WATER FOR IRRIGATION IR SOLN
Status: DC | PRN
  Administered 2023-09-18: 120 mL

## 2023-09-18 MED ORDER — PROPOFOL 10 MG/ML IV BOLUS
INTRAVENOUS | Status: DC | PRN
  Administered 2023-09-18: 20 mg via INTRAVENOUS
  Administered 2023-09-18: 30 mg via INTRAVENOUS
  Administered 2023-09-18: 60 mg via INTRAVENOUS
  Administered 2023-09-18: 50 mg via INTRAVENOUS

## 2023-09-18 NOTE — Anesthesia Preprocedure Evaluation (Signed)
Anesthesia Evaluation  Patient identified by MRN, date of birth, ID band Patient awake    Reviewed: Allergy & Precautions, H&P , NPO status , Patient's Chart, lab work & pertinent test results, reviewed documented beta blocker date and time   Airway Mallampati: II  TM Distance: >3 FB Neck ROM: full    Dental no notable dental hx. (+) Dental Advisory Given, Teeth Intact   Pulmonary asthma , sleep apnea , COPD, former smoker asbestosis   Pulmonary exam normal breath sounds clear to auscultation       Cardiovascular hypertension, + CAD  Normal cardiovascular exam+ dysrhythmias Atrial Fibrillation and Supra Ventricular Tachycardia + Valvular Problems/Murmurs AS  Rhythm:regular Rate:Normal  Calcification on cardiac CT   Neuro/Psych  negative psych ROS   GI/Hepatic Neg liver ROS, hiatal hernia,GERD  ,,  Endo/Other  negative endocrine ROS    Renal/GU negative Renal ROS  negative genitourinary   Musculoskeletal  (+) Arthritis , Osteoarthritis,    Abdominal   Peds  Hematology negative hematology ROS (+)   Anesthesia Other Findings   Reproductive/Obstetrics negative OB ROS                             Anesthesia Physical Anesthesia Plan  ASA: 3  Anesthesia Plan: General   Post-op Pain Management: Minimal or no pain anticipated   Induction: Intravenous  PONV Risk Score and Plan: Propofol infusion  Airway Management Planned: Nasal Cannula and Natural Airway  Additional Equipment: None  Intra-op Plan:   Post-operative Plan:   Informed Consent: I have reviewed the patients History and Physical, chart, labs and discussed the procedure including the risks, benefits and alternatives for the proposed anesthesia with the patient or authorized representative who has indicated his/her understanding and acceptance.     Dental Advisory Given  Plan Discussed with: CRNA  Anesthesia Plan  Comments:        Anesthesia Quick Evaluation

## 2023-09-18 NOTE — Discharge Instructions (Signed)
EGD Discharge instructions Please read the instructions outlined below and refer to this sheet in the next few weeks. These discharge instructions provide you with general information on caring for yourself after you leave the hospital. Your doctor may also give you specific instructions. While your treatment has been planned according to the most current medical practices available, unavoidable complications occasionally occur. If you have any problems or questions after discharge, please call your doctor. ACTIVITY You may resume your regular activity but move at a slower pace for the next 24 hours.  Take frequent rest periods for the next 24 hours.  Walking will help expel (get rid of) the air and reduce the bloated feeling in your abdomen.  No driving for 24 hours (because of the anesthesia (medicine) used during the test).  You may shower.  Do not sign any important legal documents or operate any machinery for 24 hours (because of the anesthesia used during the test).  NUTRITION Drink plenty of fluids.  You may resume your normal diet.  Begin with a light meal and progress to your normal diet.  Avoid alcoholic beverages for 24 hours or as instructed by your caregiver.  MEDICATIONS You may resume your normal medications unless your caregiver tells you otherwise.  WHAT YOU CAN EXPECT TODAY You may experience abdominal discomfort such as a feeling of fullness or "gas" pains.  FOLLOW-UP Your doctor will discuss the results of your test with you.  SEEK IMMEDIATE MEDICAL ATTENTION IF ANY OF THE FOLLOWING OCCUR: Excessive nausea (feeling sick to your stomach) and/or vomiting.  Severe abdominal pain and distention (swelling).  Trouble swallowing.  Temperature over 101 F (37.8 C).  Rectal bleeding or vomiting of blood.    Mild inflammation found in your stomach.  Small polyp removed.  Stomach biopsied  No significant findings.  I am doubtful you had any sort of significant GI bleeding (if  at all).  Further recommendations to follow pending review of stomach samples  Would avoid taking Pepto-Bismol in the future as it can can confuse the picture.  Continue taking Nexium twice daily for now  May take a second dose of MiraLAX daily for management of constipation  Office visit with Tana Coast in 4 weeks  At patient request, I called Lindwood Debuhr at [phone] -reviewed findings and recommendations

## 2023-09-18 NOTE — Transfer of Care (Signed)
Immediate Anesthesia Transfer of Care Note  Patient: JIAYI KOBERSTEIN  Procedure(s) Performed: ESOPHAGOGASTRODUODENOSCOPY (EGD) WITH PROPOFOL BIOPSY POLYPECTOMY  Patient Location: Endoscopy Unit  Anesthesia Type:General  Level of Consciousness: awake, alert , and oriented  Airway & Oxygen Therapy: Patient Spontanous Breathing  Post-op Assessment: Report given to RN and Post -op Vital signs reviewed and stable  Post vital signs: Reviewed and stable  Last Vitals:  Vitals Value Taken Time  BP 131/64   Temp 98   Pulse 87   Resp 16   SpO2 98     Last Pain:  Vitals:   09/18/23 0857  TempSrc:   PainSc: 0-No pain      Patients Stated Pain Goal: 5 (09/18/23 0752)  Complications: No notable events documented.

## 2023-09-18 NOTE — Op Note (Signed)
[facility] Patient Name: Julian Fowler Procedure Date: 09/18/2023 8:33 AM MRN: [id number] Date of Birth: 03/31/1935 Attending MD: Gennette Pac , MD, [id number] CSN: [id number] Age: 88 Admit Type: Outpatient Procedure:                Upper GI endoscopy Indications:              Melena Providers:                Gennette Pac, MD, Sheran Fava,                            Kristine L. Jessee Avers, Technician Referring MD:              Medicines:                Propofol per Anesthesia Complications:            No immediate complications. Estimated Blood Loss:     Estimated blood loss was minimal. Procedure:                Pre-Anesthesia Assessment:                           - Prior to the procedure, a History and Physical                            was performed, and patient medications and                            allergies were reviewed. The patient's tolerance of                            previous anesthesia was also reviewed. The risks                            and benefits of the procedure and the sedation                            options and risks were discussed with the patient.                            All questions were answered, and informed consent                            was obtained. Prior Anticoagulants: The patient has                            taken no anticoagulant or antiplatelet agents. ASA                            Grade Assessment: III - A patient with severe                            systemic disease. After reviewing the risks and  benefits, the patient was deemed in satisfactory                            condition to undergo the procedure.                           After obtaining informed consent, the endoscope was                            passed under direct vision. Throughout the                            procedure, the patient's blood pressure, pulse, and                            oxygen  saturations were monitored continuously. The                            GIF-H190 ([id number]) scope was introduced through the                            mouth, and advanced to the second part of duodenum.                            The upper GI endoscopy was accomplished without                            difficulty. The patient tolerated the procedure                            well. Scope In: 9:01:44 AM Scope Out: 9:09:38 AM Total Procedure Duration: 0 hours 7 minutes 54 seconds  Findings:      The examined esophagus was normal.      Stomach with empty. Small hiatal hernia. Patchy erythema. Superficial       erosions in the antrum. Scattered polypoid mucosa. Couple of mid body 5       mm polyps. No ulcer or infiltrating process. Pylorus patent.      The duodenal bulb and second portion of the duodenum were normal.       Biopsies of the gastric mucosa taken for histologic study. Single polyp       biopsied separately. Impression:               - Normal esophagus. Polypoid gastric mucosa; status                            post biopsy gastric polyp and gastric mucosa                           - Normal duodenal bulb and second portion of the                            duodenum.                           -  With a hemoglobin of 15 earlier in the week and                            Pepto-Bismol use recently I doubt this patient had                            a significant GI bleed (if any at all). He does                            note he has been more constipated recently in spite                            of taking fiber and 1 dose of MiraLAX daily. Moderate Sedation:      Moderate (conscious) sedation was personally administered by an       anesthesia professional. The following parameters were monitored: oxygen       saturation, heart rate, blood pressure, respiratory rate, EKG, adequacy       of pulmonary ventilation, and response to care. Recommendation:           - Patient has a  contact number available for                            emergencies. The signs and symptoms of potential                            delayed complications were discussed with the                            patient. Return to normal activities tomorrow.                            Written discharge instructions were provided to the                            patient.                           - Advance diet as tolerated. Continue present                            medications. Increase MiraLAX to twice daily to                            facilitate bowel function. Follow-up on pathology.                            Office visit with [place] in 4 weeks. Procedure Code(s):        --- Professional ---                           [phone], Esophagogastroduodenoscopy, flexible,                            transoral; diagnostic, including collection of  specimen(s) by brushing or washing, when performed                            (separate procedure) Diagnosis Code(s):        --- Professional ---                           K92.1, Melena (includes Hematochezia) CPT copyright 2022 [employer] Association. All rights reserved. The codes documented in this report are preliminary and upon coder review may  be revised to meet current compliance requirements. Julian Fowler. Julian Grim, MD Gennette Pac, MD 09/18/2023 9:24:13 AM This report has been signed electronically. Number of Addenda: 0

## 2023-09-18 NOTE — Interval H&P Note (Signed)
History and Physical Interval Note:  09/18/2023 8:46 AM  Julian Fowler  has presented today for surgery, with the diagnosis of look for signs of bleeding in stomach.  The various methods of treatment have been discussed with the patient and family. After consideration of risks, benefits and other options for treatment, the patient has consented to  Procedure(s) with comments: ESOPHAGOGASTRODUODENOSCOPY (EGD) WITH PROPOFOL (N/A) - 930am, asa 3 as a surgical intervention.  The patient's history has been reviewed, patient examined, no change in status, stable for surgery.  I have reviewed the patient's chart and labs.  Questions were answered to the patient's satisfaction.     Julian Fowler  Hemoglobin 15-day of office visit.  No hematochezia.  History of atrial fibrillation but is not anticoagulated.  No further black stools.  Really no upper GI tract symptoms.  He does note constipation recently in spite of taking twice daily fiber and MiraLAX.  No dysphagia.  Diagnostic EGD today per plan

## 2023-09-18 NOTE — Anesthesia Postprocedure Evaluation (Signed)
Anesthesia Post Note  Patient: Julian Fowler  Procedure(s) Performed: ESOPHAGOGASTRODUODENOSCOPY (EGD) WITH PROPOFOL BIOPSY POLYPECTOMY  Patient location during evaluation: PACU Anesthesia Type: General Level of consciousness: awake and alert Pain management: pain level controlled Vital Signs Assessment: post-procedure vital signs reviewed and stable Respiratory status: spontaneous breathing, nonlabored ventilation, respiratory function stable and patient connected to nasal cannula oxygen Cardiovascular status: blood pressure returned to baseline and stable Postop Assessment: no apparent nausea or vomiting Anesthetic complications: no   There were no known notable events for this encounter.   Last Vitals:  Vitals:   09/18/23 0752 09/18/23 0914  BP: (!) 182/98 (!) 137/99  Pulse: 79 86  Resp: 20 16  Temp: 36.9 C 36.5 C  SpO2: 98% 99%    Last Pain:  Vitals:   09/18/23 0914  TempSrc: Axillary  PainSc: 0-No pain                 Aurore Redinger L Koty Anctil

## 2023-09-19 LAB — SURGICAL PATHOLOGY

## 2023-09-24 ENCOUNTER — Encounter: Payer: Self-pay | Admitting: Internal Medicine

## 2023-09-26 ENCOUNTER — Encounter (HOSPITAL_COMMUNITY): Payer: Self-pay | Admitting: Internal Medicine

## 2023-09-26 DIAGNOSIS — R7301 Impaired fasting glucose: Secondary | ICD-10-CM | POA: Diagnosis not present

## 2023-09-26 DIAGNOSIS — E876 Hypokalemia: Secondary | ICD-10-CM | POA: Diagnosis not present

## 2023-09-26 DIAGNOSIS — I10 Essential (primary) hypertension: Secondary | ICD-10-CM | POA: Diagnosis not present

## 2023-09-26 DIAGNOSIS — Z1329 Encounter for screening for other suspected endocrine disorder: Secondary | ICD-10-CM | POA: Diagnosis not present

## 2023-09-29 ENCOUNTER — Other Ambulatory Visit: Payer: Self-pay | Admitting: Urology

## 2023-09-29 ENCOUNTER — Other Ambulatory Visit: Payer: Self-pay | Admitting: Internal Medicine

## 2023-09-29 DIAGNOSIS — N390 Urinary tract infection, site not specified: Secondary | ICD-10-CM

## 2023-10-03 DIAGNOSIS — I10 Essential (primary) hypertension: Secondary | ICD-10-CM | POA: Diagnosis not present

## 2023-10-03 DIAGNOSIS — F0390 Unspecified dementia without behavioral disturbance: Secondary | ICD-10-CM | POA: Diagnosis not present

## 2023-10-03 DIAGNOSIS — E871 Hypo-osmolality and hyponatremia: Secondary | ICD-10-CM | POA: Diagnosis not present

## 2023-10-03 DIAGNOSIS — R2681 Unsteadiness on feet: Secondary | ICD-10-CM | POA: Diagnosis not present

## 2023-10-03 DIAGNOSIS — M48061 Spinal stenosis, lumbar region without neurogenic claudication: Secondary | ICD-10-CM | POA: Diagnosis not present

## 2023-10-03 DIAGNOSIS — Z6836 Body mass index (BMI) 36.0-36.9, adult: Secondary | ICD-10-CM | POA: Diagnosis not present

## 2023-10-03 DIAGNOSIS — I4819 Other persistent atrial fibrillation: Secondary | ICD-10-CM | POA: Diagnosis not present

## 2023-10-03 DIAGNOSIS — R413 Other amnesia: Secondary | ICD-10-CM | POA: Diagnosis not present

## 2023-10-15 DIAGNOSIS — I10 Essential (primary) hypertension: Secondary | ICD-10-CM | POA: Diagnosis not present

## 2023-10-15 DIAGNOSIS — I4819 Other persistent atrial fibrillation: Secondary | ICD-10-CM | POA: Diagnosis not present

## 2023-10-15 DIAGNOSIS — J4520 Mild intermittent asthma, uncomplicated: Secondary | ICD-10-CM | POA: Diagnosis not present

## 2023-10-21 ENCOUNTER — Ambulatory Visit: Payer: Medicare Other | Admitting: Gastroenterology

## 2023-10-21 NOTE — Progress Notes (Deleted)
 GI Office Note    Referring Provider: Lari Elspeth BRAVO, MD Primary Care Physician:  Lari Elspeth BRAVO, MD  Primary Gastroenterologist: Ozell Hollingshead, MD   Chief Complaint   No chief complaint on file.   History of Present Illness   Julian Fowler is a 89 y.o. male presenting today     Last seen 09/2023. H/o dark stools and postprandial nausea/dyspepsia. H/o GERD. Hgb 15. EGD recommended to rule out GI bleed. He reported taking pepto after he saw dark stools.   EGD 09/2023:  -normal esophagus. Polypoid gastric mucosa s/p bx -normal duodenal bulb and second portion of duodenum  Colonoscopy 02/2017:  -internal hemorroids, grade III -diverticulosis   Medications   Current Outpatient Medications  Medication Sig Dispense Refill   acetaminophen  (TYLENOL ) 325 MG tablet Take 325 mg by mouth as needed.     azelastine  (ASTELIN ) 0.1 % nasal spray 1-2 puffs each nostril every 8 hours if needed for drainage (Patient taking differently: as needed. 1-2 puffs each nostril every 8 hours if needed for drainage) 30 mL 12   Calcium  Carb-Vit D-C-E-Mineral (OS-CAL ULTRA) 600 MG TABS Take 1 tablet by mouth daily.      diclofenac  Sodium (VOLTAREN ) 1 % GEL Apply 2 g topically daily as needed (For pain).     esomeprazole  (NEXIUM ) 40 MG capsule TAKE 1 CAPSULE BY MOUTH TWICE DAILY BEFORE A MEAL 60 capsule 0   fexofenadine (ALLEGRA) 180 MG tablet Take 180 mg by mouth daily as needed for allergies.     fluticasone  (FLONASE ) 50 MCG/ACT nasal spray Place 2 sprays into both nostrils as needed for allergies or rhinitis.     hydrALAZINE  (APRESOLINE ) 50 MG tablet TAKE 1 & 1/2 (ONE & ONE-HALF) TABLETS BY MOUTH THREE TIMES DAILY 405 tablet 2   latanoprost  (XALATAN ) 0.005 % ophthalmic solution Place 1 drop into both eyes at bedtime.      LORazepam  (ATIVAN ) 1 MG tablet TAKE 1 TABLET BY MOUTH THREE TIMES DAILY AS NEEDED FOR ANXIETY 90 tablet 5   losartan  (COZAAR ) 100 MG tablet Take 100 mg by mouth every  morning.      Multiple Vitamins-Minerals (CENTRUM SILVER PO) Take 1 tablet by mouth daily.      nitrofurantoin  (MACRODANTIN ) 50 MG capsule Take 1 capsule by mouth at bedtime 30 capsule 0   Polyethyl Glycol-Propyl Glycol (SYSTANE OP) Place 1 drop into both eyes daily.     polyethylene glycol (MIRALAX  / GLYCOLAX ) packet Take 17 g by mouth at bedtime.     potassium chloride  SA (KLOR-CON  M) 20 MEQ tablet Take 1 tablet (20 mEq total) by mouth daily. 30 tablet 6   pravastatin  (PRAVACHOL ) 40 MG tablet Take 1 tablet by mouth at bedtime.      PROAIR  HFA 108 (90 Base) MCG/ACT inhaler INHALE 2 PUFFS BY MOUTH EVERY 6 HOURS AS NEEDED FOR WHEEZING FOR SHORTNESS OF BREATH (Patient taking differently: Inhale 1 puff into the lungs every 6 (six) hours as needed.) 18 g 12   silodosin  (RAPAFLO ) 8 MG CAPS capsule Take 1 capsule (8 mg total) by mouth at bedtime. 30 capsule 11   sodium chloride  1 g tablet Take 1 g by mouth 3 (three) times daily.     SYMBICORT  160-4.5 MCG/ACT inhaler INHALE 2 PUFFS BY MOUTH  TWICE DAILY THEN  RINSE  MOUTH 11 g 6   tadalafil  (CIALIS ) 20 MG tablet Take 1 tablet (20 mg total) by mouth daily as needed. 10 tablet 5  Wheat Dextrin (BENEFIBER) POWD Take 1 packet by mouth 2 (two) times daily.     Zinc  25 MG TABS Take 1 tablet by mouth daily.     zolpidem  (AMBIEN ) 10 MG tablet Take 1 tablet by mouth at bedtime.      No current facility-administered medications for this visit.    Allergies   Allergies as of 10/21/2023 - Review Complete 09/18/2023  Allergen Reaction Noted   Latex Itching 07/01/2019   Other  04/10/2012   Bactrim [sulfamethoxazole-trimethoprim]  12/27/2020   Prednisone  12/17/2017   Codeine Other (See Comments)    Tape Other (See Comments) 04/10/2012     Past Medical History   Past Medical History:  Diagnosis Date   Allergic rhinitis    Aortic stenosis    Arthritis    Atrial fibrillation (HCC)    BPH (benign prostatic hyperplasia)    Coronary artery  calcification seen on CT scan    Easy bruisability    ED (erectile dysfunction)    Essential hypertension    GERD (gastroesophageal reflux disease)    H/O hiatal hernia    Hearing loss in left ear    History of asbestosis    History of shingles    Hyperlipidemia    Overactive bladder    PSVT (paroxysmal supraventricular tachycardia) (HCC)    Sleep apnea    No longer on CPAP following weight loss   Tinnitus     Past Surgical History   Past Surgical History:  Procedure Laterality Date   BACK SURGERY  2014   herniated L1, L2   Dr Gaither   BIOPSY  09/18/2023   Procedure: BIOPSY;  Surgeon: Shaaron Lamar HERO, MD;  Location: [facility];  Service: Endoscopy;;   BRAIN SURGERY     CEREBRAL EMBOLIZATION  12/2011   radiation therapy-did not work   CHOLECYSTECTOMY  6/98   COLONOSCOPY  09/2009   Dr. Barry: normal, internal hemorrhoids    COLONOSCOPY N/A 02/28/2017   Dr. Shaaron: Hemorrhoids, grade 3, mild diverticulosis.   CRANIECTOMY FOR EXCISION OF ACOUSTIC NEUROMA  3/95   ESOPHAGOGASTRODUODENOSCOPY (EGD) WITH PROPOFOL  N/A 09/18/2023   Procedure: ESOPHAGOGASTRODUODENOSCOPY (EGD) WITH PROPOFOL ;  Surgeon: Shaaron Lamar HERO, MD;  Location: [facility];  Service: Endoscopy;  Laterality: N/A;  930am, asa 3   MINOR AMPUTATION OF DIGIT Left 12/31/2018   Procedure: REVISION AMPUTATION OF LEFT INDEX FINGER, IRRIGATION AND DEBRIDEMENT LEFT INDEX FINGER;  Surgeon: Carolee Lynwood JINNY DOUGLAS, MD;  Location: [facility];  Service: Orthopedics;  Laterality: Left;   POLYPECTOMY  09/18/2023   Procedure: POLYPECTOMY;  Surgeon: Shaaron Lamar HERO, MD;  Location: [facility];  Service: Endoscopy;;   RADIOLOGY WITH ANESTHESIA N/A 07/07/2014   Procedure: EMBOLIZATION;  Surgeon: Thyra MARLA Nash, MD;  Location: [facility] OR;  Service: Radiology;  Laterality: N/A;   TOTAL KNEE ARTHROPLASTY Left 07/06/2013   Procedure: LEFT TOTAL KNEE ARTHROPLASTY;  Surgeon: Dempsey LULLA Moan, MD;  Location: [facility];  Service: Orthopedics;   Laterality: Left;   TOTAL KNEE ARTHROPLASTY Right 01/18/2014   Procedure: RIGHT TOTAL KNEE ARTHROPLASTY;  Surgeon: Dempsey LULLA Moan, MD;  Location: [facility];  Service: Orthopedics;  Laterality: Right;   TRIGGER FINGER RELEASE  2003   (thumb) middle finger (2006)    Past Family History   Family History  Problem Relation Age of Onset   Cancer Father        oral cancer   Breast cancer Mother    Heart attack Mother  Hypertension Sister        Bypass x4   Alcohol  abuse Sister    Alzheimer's disease Sister    Kidney disease Sister    Colon cancer Neg Hx     Past Social History   Social History   Socioeconomic History   Marital status: Married    Spouse name: Not on file   Number of children: Not on file   Years of education: Not on file   Highest education level: Not on file  Occupational History   Not on file  Tobacco Use   Smoking status: Former    Current packs/day: 0.00    Average packs/day: 1.5 packs/day for 30.0 years (45.0 ttl pk-yrs)    Types: Cigarettes    Start date: 04/24/1957    Quit date: 10/15/1986    Years since quitting: 37.0    Passive exposure: Never   Smokeless tobacco: Never  Vaping Use   Vaping status: Never Used  Substance and Sexual Activity   Alcohol  use: No    Alcohol /week: 0.0 standard drinks of alcohol     Comment: Used to drink heavily at times   Drug use: No   Sexual activity: Not Currently  Other Topics Concern   Not on file  Social History Narrative   Co-dependent relationship with his 71 year old son who has drug and financial problems   Recently married to [place] on 01/02/2014   Social Drivers of Health   Financial Resource Strain: Not on file  Food Insecurity: Not on file  Transportation Needs: Not on file  Physical Activity: Not on file  Stress: Not on file  Social Connections: Unknown (06/08/2022)   Received from [facility], [facility]   Social Network    Social Network: Not on file  Intimate Partner Violence: Unknown  (06/08/2022)   Received from [facility], [facility]   HITS    Physically Hurt: Not on file    Insult or Talk Down To: Not on file    Threaten Physical Harm: Not on file    Scream or Curse: Not on file    Review of Systems   General: Negative for anorexia, weight loss, fever, chills, fatigue, weakness. ENT: Negative for hoarseness, difficulty swallowing , nasal congestion. CV: Negative for chest pain, angina, palpitations, dyspnea on exertion, peripheral edema.  Respiratory: Negative for dyspnea at rest, dyspnea on exertion, cough, sputum, wheezing.  GI: See history of present illness. GU:  Negative for dysuria, hematuria, urinary incontinence, urinary frequency, nocturnal urination.  Endo: Negative for unusual weight change.     Physical Exam   There were no vitals taken for this visit.   General: Well-nourished, well-developed in no acute distress.  Eyes: No icterus. Mouth: Oropharyngeal mucosa moist and pink , no lesions erythema or exudate. Lungs: Clear to auscultation bilaterally.  Heart: Regular rate and rhythm, no murmurs rubs or gallops.  Abdomen: Bowel sounds are normal, nontender, nondistended, no hepatosplenomegaly or masses,  no abdominal bruits or hernia , no rebound or guarding.  Rectal: ***  Extremities: No lower extremity edema. No clubbing or deformities. Neuro: Alert and oriented x 4   Skin: Warm and dry, no jaundice.   Psych: Alert and cooperative, normal mood and affect.  Labs   Lab Results  Component Value Date   WBC 7.8 09/16/2023   HGB 15.0 09/16/2023   HCT 45.2 09/16/2023   MCV 92.4 09/16/2023   PLT 355 09/16/2023    Imaging Studies   No results found.  Assessment       PLAN   ***   Sonny RAMAN. Ezzard, MHS, PA-C [facility]

## 2023-10-26 ENCOUNTER — Other Ambulatory Visit: Payer: Self-pay | Admitting: Internal Medicine

## 2023-11-06 ENCOUNTER — Ambulatory Visit: Payer: Medicare Other | Admitting: Gastroenterology

## 2023-11-06 ENCOUNTER — Encounter: Payer: Self-pay | Admitting: Gastroenterology

## 2023-11-06 NOTE — Progress Notes (Deleted)
GI Office Note    Referring Provider: Juliette Alcide, MD Primary Care Physician:  Juliette Alcide, MD  Primary Gastroenterologist:  Chief Complaint   No chief complaint on file.   History of Present Illness   Julian Fowler is a 89 y.o. male presenting today        Last seen 09/2023. H/o dark stools and postprandial nausea/dyspepsia. H/o GERD. Hgb 15. EGD recommended to rule out GI bleed. He reported taking pepto after he saw dark stools.     EGD 09/2023:  -normal esophagus. Polypoid gastric mucosa s/p bx -normal duodenal bulb and second portion of duodenum   Colonoscopy 02/2017:  -internal hemorroids, grade III -diverticulosis         Medications   Current Outpatient Medications  Medication Sig Dispense Refill   acetaminophen (TYLENOL) 325 MG tablet Take 325 mg by mouth as needed.     azelastine (ASTELIN) 0.1 % nasal spray 1-2 puffs each nostril every 8 hours if needed for drainage (Patient taking differently: as needed. 1-2 puffs each nostril every 8 hours if needed for drainage) 30 mL 12   Calcium Carb-Vit D-C-E-Mineral (OS-CAL ULTRA) 600 MG TABS Take 1 tablet by mouth daily.      diclofenac Sodium (VOLTAREN) 1 % GEL Apply 2 g topically daily as needed (For pain).     esomeprazole (NEXIUM) 40 MG capsule TAKE 1 CAPSULE BY MOUTH TWICE DAILY BEFORE A MEAL 60 capsule 3   fexofenadine (ALLEGRA) 180 MG tablet Take 180 mg by mouth daily as needed for allergies.     fluticasone (FLONASE) 50 MCG/ACT nasal spray Place 2 sprays into both nostrils as needed for allergies or rhinitis.     hydrALAZINE (APRESOLINE) 50 MG tablet TAKE 1 & 1/2 (ONE & ONE-HALF) TABLETS BY MOUTH THREE TIMES DAILY 405 tablet 2   latanoprost (XALATAN) 0.005 % ophthalmic solution Place 1 drop into both eyes at bedtime.      LORazepam (ATIVAN) 1 MG tablet TAKE 1 TABLET BY MOUTH THREE TIMES DAILY AS NEEDED FOR ANXIETY 90 tablet 5   losartan (COZAAR) 100 MG tablet Take 100 mg by mouth every  morning.      Multiple Vitamins-Minerals (CENTRUM SILVER PO) Take 1 tablet by mouth daily.      nitrofurantoin (MACRODANTIN) 50 MG capsule Take 1 capsule by mouth at bedtime 30 capsule 0   Polyethyl Glycol-Propyl Glycol (SYSTANE OP) Place 1 drop into both eyes daily.     polyethylene glycol (MIRALAX / GLYCOLAX) packet Take 17 g by mouth at bedtime.     potassium chloride SA (KLOR-CON M) 20 MEQ tablet Take 1 tablet (20 mEq total) by mouth daily. 30 tablet 6   pravastatin (PRAVACHOL) 40 MG tablet Take 1 tablet by mouth at bedtime.      PROAIR HFA 108 (90 Base) MCG/ACT inhaler INHALE 2 PUFFS BY MOUTH EVERY 6 HOURS AS NEEDED FOR WHEEZING FOR SHORTNESS OF BREATH (Patient taking differently: Inhale 1 puff into the lungs every 6 (six) hours as needed.) 18 g 12   silodosin (RAPAFLO) 8 MG CAPS capsule Take 1 capsule (8 mg total) by mouth at bedtime. 30 capsule 11   sodium chloride 1 g tablet Take 1 g by mouth 3 (three) times daily.     SYMBICORT 160-4.5 MCG/ACT inhaler INHALE 2 PUFFS BY MOUTH  TWICE DAILY THEN  RINSE  MOUTH 11 g 6   tadalafil (CIALIS) 20 MG tablet Take 1 tablet (20 mg total) by mouth  daily as needed. 10 tablet 5   Wheat Dextrin (BENEFIBER) POWD Take 1 packet by mouth 2 (two) times daily.     Zinc 25 MG TABS Take 1 tablet by mouth daily.     zolpidem (AMBIEN) 10 MG tablet Take 1 tablet by mouth at bedtime.      No current facility-administered medications for this visit.    Allergies   Allergies as of 11/06/2023 - Review Complete 09/18/2023  Allergen Reaction Noted   Latex Itching 07/01/2019   Other  04/10/2012   Bactrim [sulfamethoxazole-trimethoprim]  12/27/2020   Prednisone  12/17/2017   Codeine Other (See Comments)    Tape Other (See Comments) 04/10/2012     Past Medical History   Past Medical History:  Diagnosis Date   Allergic rhinitis    Aortic stenosis    Arthritis    Atrial fibrillation (HCC)    BPH (benign prostatic hyperplasia)    Coronary artery  calcification seen on CT scan    Easy bruisability    ED (erectile dysfunction)    Essential hypertension    GERD (gastroesophageal reflux disease)    H/O hiatal hernia    Hearing loss in left ear    History of asbestosis    History of shingles    Hyperlipidemia    Overactive bladder    PSVT (paroxysmal supraventricular tachycardia) (HCC)    Sleep apnea    No longer on CPAP following weight loss   Tinnitus     Past Surgical History   Past Surgical History:  Procedure Laterality Date   BACK SURGERY  2014   herniated L1, L2   Dr Channing Mutters   BIOPSY  09/18/2023   Procedure: BIOPSY;  Surgeon: Corbin Ade, MD;  Location: [facility];  Service: Endoscopy;;   BRAIN SURGERY     CEREBRAL EMBOLIZATION  12/2011   "radiation therapy-did not work"   CHOLECYSTECTOMY  6/98   COLONOSCOPY  09/2009   Dr. Cleotis Nipper: normal, internal hemorrhoids    COLONOSCOPY N/A 02/28/2017   Dr. Jena Gauss: Hemorrhoids, grade 3, mild diverticulosis.   CRANIECTOMY FOR EXCISION OF ACOUSTIC NEUROMA  3/95   ESOPHAGOGASTRODUODENOSCOPY (EGD) WITH PROPOFOL N/A 09/18/2023   Procedure: ESOPHAGOGASTRODUODENOSCOPY (EGD) WITH PROPOFOL;  Surgeon: Corbin Ade, MD;  Location: [facility];  Service: Endoscopy;  Laterality: N/A;  930am, asa 3   MINOR AMPUTATION OF DIGIT Left 12/31/2018   Procedure: REVISION AMPUTATION OF LEFT INDEX FINGER, IRRIGATION AND DEBRIDEMENT LEFT INDEX FINGER;  Surgeon: Ernest Mallick, MD;  Location: [facility];  Service: Orthopedics;  Laterality: Left;   POLYPECTOMY  09/18/2023   Procedure: POLYPECTOMY;  Surgeon: Corbin Ade, MD;  Location: [facility];  Service: Endoscopy;;   RADIOLOGY WITH ANESTHESIA N/A 07/07/2014   Procedure: EMBOLIZATION;  Surgeon: Oneal Grout, MD;  Location: [facility];  Service: Radiology;  Laterality: N/A;   TOTAL KNEE ARTHROPLASTY Left 07/06/2013   Procedure: LEFT TOTAL KNEE ARTHROPLASTY;  Surgeon: Loanne Drilling, MD;  Location: [facility];  Service: Orthopedics;   Laterality: Left;   TOTAL KNEE ARTHROPLASTY Right 01/18/2014   Procedure: RIGHT TOTAL KNEE ARTHROPLASTY;  Surgeon: Loanne Drilling, MD;  Location: [facility];  Service: Orthopedics;  Laterality: Right;   TRIGGER FINGER RELEASE  2003   (thumb) middle finger (2006)    Past Family History   Family History  Problem Relation Age of Onset   Cancer Father        oral cancer   Breast cancer Mother  Heart attack Mother    Hypertension Sister        Bypass x4   Alcohol abuse Sister    Alzheimer's disease Sister    Kidney disease Sister    Colon cancer Neg Hx     Past Social History   Social History   Socioeconomic History   Marital status: Married    Spouse name: Not on file   Number of children: Not on file   Years of education: Not on file   Highest education level: Not on file  Occupational History   Not on file  Tobacco Use   Smoking status: Former    Current packs/day: 0.00    Average packs/day: 1.5 packs/day for 30.0 years (45.0 ttl pk-yrs)    Types: Cigarettes    Start date: 04/24/1957    Quit date: 10/15/1986    Years since quitting: 37.0    Passive exposure: Never   Smokeless tobacco: Never  Vaping Use   Vaping status: Never Used  Substance and Sexual Activity   Alcohol use: No    Alcohol/week: 0.0 standard drinks of alcohol    Comment: Used to drink heavily at times   Drug use: No   Sexual activity: Not Currently  Other Topics Concern   Not on file  Social History Narrative   Co-dependent relationship with his 39 year old son who has drug and financial problems   Recently married [place] on 01/02/2014   Social Drivers of Health   Financial Resource Strain: Not on file  Food Insecurity: Not on file  Transportation Needs: Not on file  Physical Activity: Not on file  Stress: Not on file  Social Connections: Unknown (06/08/2022)   Received from [facility]   Social Network    Social Network: Not on file  Intimate Partner Violence: Unknown  (06/08/2022)   Received from [facility]   HITS    Physically Hurt: Not on file    Insult or Talk Down To: Not on file    Threaten Physical Harm: Not on file    Scream or Curse: Not on file    Review of Systems   General: Negative for anorexia, weight loss, fever, chills, fatigue, weakness. ENT: Negative for hoarseness, difficulty swallowing , nasal congestion. CV: Negative for chest pain, angina, palpitations, dyspnea on exertion, peripheral edema.  Respiratory: Negative for dyspnea at rest, dyspnea on exertion, cough, sputum, wheezing.  GI: See history of present illness. GU:  Negative for dysuria, hematuria, urinary incontinence, urinary frequency, nocturnal urination.  Endo: Negative for unusual weight change.     Physical Exam   There were no vitals taken for this visit.   General: Well-nourished, well-developed in no acute distress.  Eyes: No icterus. Mouth: Oropharyngeal mucosa moist and pink , no lesions erythema or exudate. Lungs: Clear to auscultation bilaterally.  Heart: Regular rate and rhythm, no murmurs rubs or gallops.  Abdomen: Bowel sounds are normal, nontender, nondistended, no hepatosplenomegaly or masses,  no abdominal bruits or hernia , no rebound or guarding.  Rectal: ***  Extremities: No lower extremity edema. No clubbing or deformities. Neuro: Alert and oriented x 4   Skin: Warm and dry, no jaundice.   Psych: Alert and cooperative, normal mood and affect.  Labs   Lab Results  Component Value Date   WBC 7.8 09/16/2023   HGB 15.0 09/16/2023   HCT 45.2 09/16/2023   MCV 92.4 09/16/2023   PLT 355 09/16/2023    Imaging  Studies   No results found.  Assessment       PLAN   ***   Leanna Battles. Melvyn Neth, MHS, PA-C [facility]

## 2023-11-09 ENCOUNTER — Other Ambulatory Visit: Payer: Self-pay | Admitting: Urology

## 2023-11-09 DIAGNOSIS — N390 Urinary tract infection, site not specified: Secondary | ICD-10-CM

## 2023-11-14 ENCOUNTER — Other Ambulatory Visit: Payer: Self-pay

## 2023-11-14 DIAGNOSIS — N390 Urinary tract infection, site not specified: Secondary | ICD-10-CM

## 2023-11-14 MED ORDER — NITROFURANTOIN MACROCRYSTAL 50 MG PO CAPS: 50.0000 mg | ORAL_CAPSULE | Freq: Every day | ORAL | 0 refills | Status: DC

## 2023-11-15 ENCOUNTER — Ambulatory Visit: Payer: Medicare Other | Attending: Cardiology | Admitting: Cardiology

## 2023-11-15 ENCOUNTER — Encounter: Payer: Self-pay | Admitting: Cardiology

## 2023-11-15 VITALS — BP 164/98 | HR 78 | Ht 69.5 in | Wt 241.2 lb

## 2023-11-15 DIAGNOSIS — I4891 Unspecified atrial fibrillation: Secondary | ICD-10-CM | POA: Diagnosis not present

## 2023-11-15 DIAGNOSIS — I350 Nonrheumatic aortic (valve) stenosis: Secondary | ICD-10-CM | POA: Diagnosis not present

## 2023-11-15 DIAGNOSIS — I4821 Permanent atrial fibrillation: Secondary | ICD-10-CM

## 2023-11-15 DIAGNOSIS — I10 Essential (primary) hypertension: Secondary | ICD-10-CM

## 2023-11-15 NOTE — Patient Instructions (Signed)
Medication Instructions:  Your physician recommends that you continue on your current medications as directed. Please refer to the Current Medication list given to you today.   Labwork: Will request most recent labs from [facility]  Testing/Procedures: None  Follow-Up: Your physician recommends that you schedule a follow-up appointment in: 6 months  Any Other Special Instructions Will Be Listed Below (If Applicable). Thank you for choosing [facility]!      If you need a refill on your cardiac medications before your next appointment, please call your pharmacy.

## 2023-11-15 NOTE — Progress Notes (Signed)
    Cardiology Office Note  Date: 11/15/2023   ID: Julian Fowler, DOB [birth year], MRN [id number]  History of Present Illness: Julian Fowler is an 89 y.o. male last seen in October 2024.  He is here today with his wife for a follow-up visit.  He does not report any exertional chest pain or palpitations.  Has felt more anxious about things in general including the news.  No recent falls.  I reviewed his medications.  Current regimen includes hydralazine, Cozaar, and Pravachol.  Blood pressure is elevated, trend has been up including at visit with PCP, no medication adjustments were made.  Plan to request his lab work from December 2024.  I reviewed his ECG today which shows rate controlled atrial fibrillation with IVCD and LVH.  Physical Exam: VS:  BP (!) 164/98   Pulse 78   Ht 5' 9.5" (1.765 m)   Wt 241 lb 3.2 oz (109.4 kg)   SpO2 97%   BMI 35.11 kg/m , BMI Body mass index is 35.11 kg/m.  Wt Readings from Last 3 Encounters:  11/15/23 241 lb 3.2 oz (109.4 kg)  09/16/23 241 lb (109.3 kg)  08/14/23 239 lb (108.4 kg)    General: Patient appears comfortable at rest. HEENT: Conjunctiva and lids normal. Neck: Supple, no elevated JVP or carotid bruits. Lungs: Clear to auscultation, nonlabored breathing at rest. Cardiac: Irregular, 2/6 systolic murmur, no gallop. Extremities: Mild ankle edema.  ECG:  An ECG dated 12/14/2022 was personally reviewed today and demonstrated:  Atrial fibrillation.  Labwork: August 2024: Potassium 4.2, BUN 11, creatinine 0.9 09/16/2023: Hemoglobin 15.0; Platelets 355   Other Studies Reviewed Today:  No interval cardiac testing for review today.  Assessment and Plan:  1.  Permanent atrial fibrillation with CHA2DS2-VASc score of 4.  He is not anticoagulated as per prior discussions.  Asymptomatic in terms of palpitations and heart rate controlled in the absence of AV nodal blockers.  He was previously bradycardic on Toprol-XL which has since been  discontinued.   2.  Mild degenerative, calcific aortic stenosis with mean AV gradient 10 mmHg by echocardiogram in August 2024.  Asymptomatic.   3.  Coronary artery calcification by CT imaging.  Continue Pravachol.  4.  Primary hypertension, blood pressure trending up despite current regimen including hydralazine and Cozaar.  Plan to obtain recent lab work from PCP done in December 2024.  We will likely add Aldactone particular in light of prior hypokalemia in the absence of diuretics and ongoing potassium supplementation.  Could have hyperaldosteronism.  Disposition:  Follow up  6 months.  Signed, Jonelle Sidle, M.D., F.A.C.C. [facility] at [facility]

## 2023-11-19 ENCOUNTER — Ambulatory Visit (INDEPENDENT_AMBULATORY_CARE_PROVIDER_SITE_OTHER): Payer: Medicare Other | Admitting: Gastroenterology

## 2023-11-19 ENCOUNTER — Encounter: Payer: Self-pay | Admitting: Gastroenterology

## 2023-11-19 VITALS — BP 135/86 | HR 76 | Temp 97.9°F | Ht 68.0 in | Wt 240.2 lb

## 2023-11-19 DIAGNOSIS — K219 Gastro-esophageal reflux disease without esophagitis: Secondary | ICD-10-CM

## 2023-11-19 DIAGNOSIS — K5900 Constipation, unspecified: Secondary | ICD-10-CM | POA: Diagnosis not present

## 2023-11-19 MED ORDER — ESOMEPRAZOLE MAGNESIUM 40 MG PO CPDR: 40.0000 mg | DELAYED_RELEASE_CAPSULE | Freq: Two times a day (BID) | ORAL | 3 refills | Status: DC

## 2023-11-19 NOTE — Patient Instructions (Signed)
 Continue esomeprazole  40 mg once to twice daily before a meal.  New prescription sent to the pharmacy with 90-day supply for 1 year. Continue MiraLAX  1 capful once to twice daily to maintain soft regular stool. Continue Benefiber twice daily. Return to the office in 1 year or call sooner if any questions or concerns.

## 2023-11-19 NOTE — Progress Notes (Signed)
 GI Office Note    Referring Provider: Lari Elspeth BRAVO, MD Primary Care Physician:  Lari Elspeth BRAVO, MD  Primary Gastroenterologist: Ozell Hollingshead, MD   Chief Complaint   Chief Complaint  Patient presents with   Follow-up    History of Present Illness   Julian Fowler is a 89 y.o. male presenting today for follow-up.   Last seen 09/2023. H/o dark stools and postprandial nausea/dyspepsia. H/o GERD. Hgb 15. EGD recommended to rule out GI bleed. He reported taking pepto after he saw dark stools.  With history of constipation.    EGD 09/2023:  -normal esophagus. Polypoid gastric mucosa s/p bx 9 fundic gland polyp, reactive gastropathy with minimal chronic gastritis.  No H. pylori -normal duodenal bulb and second portion of duodenum   Colonoscopy 02/2017:  -internal hemorroids, grade III -diverticulosis  Today: doing well. No weight loss.  Appetite good.  No nausea or vomiting.  Heartburn well-controlled, continues Nexium  twice daily.  Wife did not want him to go back to once daily because he has been doing so well and he has so many other issues.  He did increase MiraLAX  to twice daily temporarily, now back to once daily with regular bowel movements.  No melena or rectal bleeding.  Abdominal pain only prior to bowel movements.   Medications   Current Outpatient Medications  Medication Sig Dispense Refill   acetaminophen  (TYLENOL ) 325 MG tablet Take 325 mg by mouth as needed.     azelastine  (ASTELIN ) 0.1 % nasal spray 1-2 puffs each nostril every 8 hours if needed for drainage (Patient taking differently: as needed. 1-2 puffs each nostril every 8 hours if needed for drainage) 30 mL 12   Calcium  Carb-Vit D-C-E-Mineral (OS-CAL ULTRA) 600 MG TABS Take 1 tablet by mouth daily.      Cholecalciferol  (VITAMIN D3) 25 MCG (1000 UT) CAPS Take 1 capsule by mouth daily.     diclofenac  Sodium (VOLTAREN ) 1 % GEL Apply 2 g topically daily as needed (For pain).     esomeprazole  (NEXIUM )  40 MG capsule TAKE 1 CAPSULE BY MOUTH TWICE DAILY BEFORE A MEAL 60 capsule 3   fexofenadine (ALLEGRA) 180 MG tablet Take 180 mg by mouth daily as needed for allergies.     fluticasone  (FLONASE ) 50 MCG/ACT nasal spray Place 2 sprays into both nostrils as needed for allergies or rhinitis.     hydrALAZINE  (APRESOLINE ) 50 MG tablet TAKE 1 & 1/2 (ONE & ONE-HALF) TABLETS BY MOUTH THREE TIMES DAILY 405 tablet 2   latanoprost  (XALATAN ) 0.005 % ophthalmic solution Place 1 drop into both eyes at bedtime.      LORazepam  (ATIVAN ) 1 MG tablet TAKE 1 TABLET BY MOUTH THREE TIMES DAILY AS NEEDED FOR ANXIETY 90 tablet 5   losartan  (COZAAR ) 100 MG tablet Take 100 mg by mouth every morning.      memantine  (NAMENDA ) 5 MG tablet Take 5 mg by mouth 2 (two) times daily.     Multiple Vitamins-Minerals (CENTRUM SILVER PO) Take 1 tablet by mouth daily.      nitrofurantoin  (MACRODANTIN ) 50 MG capsule Take 1 capsule by mouth at bedtime 30 capsule 0   Polyethyl Glycol-Propyl Glycol (SYSTANE OP) Place 1 drop into both eyes daily.     polyethylene glycol (MIRALAX  / GLYCOLAX ) packet Take 17 g by mouth at bedtime.     potassium chloride  SA (KLOR-CON  M) 20 MEQ tablet Take 1 tablet (20 mEq total) by mouth daily. 30 tablet 6  pravastatin  (PRAVACHOL ) 40 MG tablet Take 1 tablet by mouth at bedtime.      PROAIR  HFA 108 (90 Base) MCG/ACT inhaler INHALE 2 PUFFS BY MOUTH EVERY 6 HOURS AS NEEDED FOR WHEEZING FOR SHORTNESS OF BREATH (Patient taking differently: Inhale 1 puff into the lungs every 6 (six) hours as needed.) 18 g 12   silodosin  (RAPAFLO ) 8 MG CAPS capsule Take 1 capsule (8 mg total) by mouth at bedtime. 30 capsule 11   sodium chloride  1 g tablet Take 1 g by mouth 3 (three) times daily.     SYMBICORT  160-4.5 MCG/ACT inhaler INHALE 2 PUFFS BY MOUTH  TWICE DAILY THEN  RINSE  MOUTH 11 g 6   tadalafil  (CIALIS ) 20 MG tablet Take 1 tablet (20 mg total) by mouth daily as needed. 10 tablet 5   Wheat Dextrin (BENEFIBER) POWD Take 1  packet by mouth 2 (two) times daily.     Zinc  25 MG TABS Take 1 tablet by mouth daily.     zolpidem  (AMBIEN ) 10 MG tablet Take 1 tablet by mouth at bedtime.      No current facility-administered medications for this visit.    Allergies   Allergies as of 11/19/2023 - Review Complete 11/19/2023  Allergen Reaction Noted   Latex Itching 07/01/2019   Other  04/10/2012   Bactrim [sulfamethoxazole-trimethoprim]  12/27/2020   Prednisone  12/17/2017   Codeine Other (See Comments)    Tape Other (See Comments) 04/10/2012       Review of Systems   General: Negative for anorexia, weight loss, fever, chills, fatigue, +weakness. +balance issues ENT: Negative for hoarseness, difficulty swallowing , nasal congestion. CV: Negative for chest pain, angina, palpitations, dyspnea on exertion, peripheral edema.  Respiratory: Negative for dyspnea at rest, dyspnea on exertion, cough, sputum, wheezing.  GI: See history of present illness. GU:  Negative for dysuria, hematuria, urinary incontinence, urinary frequency, nocturnal urination.  Endo: Negative for unusual weight change.     Physical Exam   BP 135/86 (BP Location: Right Arm, Patient Position: Sitting, Cuff Size: Large)   Pulse 76   Temp 97.9 F (36.6 C) (Oral)   Ht 5' 8 (1.727 m)   Wt 240 lb 3.2 oz (109 kg)   SpO2 97%   BMI 36.52 kg/m    General: Well-nourished, well-developed in no acute distress.  Eyes: No icterus. Mouth: Oropharyngeal mucosa moist and pink   Lungs: Clear to auscultation bilaterally.  Heart: Regular rate and rhythm, no murmurs rubs or gallops.  Abdomen: Bowel sounds are normal, nontender, nondistended, no hepatosplenomegaly or masses,  no abdominal bruits or hernia , no rebound or guarding.  Rectal: not performed  Extremities: No lower extremity edema. No clubbing or deformities. Neuro: Alert and oriented x 4   Skin: Warm and dry, no jaundice.   Psych: Alert and cooperative, normal mood and affect.  Labs    Lab Results  Component Value Date   WBC 7.8 09/16/2023   HGB 15.0 09/16/2023   HCT 45.2 09/16/2023   MCV 92.4 09/16/2023   PLT 355 09/16/2023     Imaging Studies   No results found.  Assessment/Plan:   GERD/dyspepsia -Doing well -Continue esomeprazole  40 mg once to twice daily before meals new prescription provided-return office visit in 1 year or call sooner if needed   Constipation: -MiraLAX  1 capful once to twice daily as needed -Benefiber twice daily   Sonny RAMAN. Ezzard, MHS, PA-C [facility]

## 2023-11-21 ENCOUNTER — Other Ambulatory Visit: Payer: Self-pay | Admitting: *Deleted

## 2023-11-21 ENCOUNTER — Telehealth: Payer: Self-pay | Admitting: Cardiology

## 2023-11-21 DIAGNOSIS — Z79899 Other long term (current) drug therapy: Secondary | ICD-10-CM

## 2023-11-21 DIAGNOSIS — M79671 Pain in right foot: Secondary | ICD-10-CM | POA: Diagnosis not present

## 2023-11-21 DIAGNOSIS — M79672 Pain in left foot: Secondary | ICD-10-CM | POA: Diagnosis not present

## 2023-11-21 DIAGNOSIS — M79675 Pain in left toe(s): Secondary | ICD-10-CM | POA: Diagnosis not present

## 2023-11-21 DIAGNOSIS — I739 Peripheral vascular disease, unspecified: Secondary | ICD-10-CM | POA: Diagnosis not present

## 2023-11-21 DIAGNOSIS — I10 Essential (primary) hypertension: Secondary | ICD-10-CM

## 2023-11-21 DIAGNOSIS — L110 Acquired keratosis follicularis: Secondary | ICD-10-CM | POA: Diagnosis not present

## 2023-11-21 DIAGNOSIS — M79674 Pain in right toe(s): Secondary | ICD-10-CM | POA: Diagnosis not present

## 2023-11-21 MED ORDER — SPIRONOLACTONE 25 MG PO TABS: 12.5000 mg | ORAL_TABLET | Freq: Every day | ORAL | 1 refills | Status: DC

## 2023-11-21 NOTE — Telephone Encounter (Signed)
 Patient and wife Julian Fowler informed and verbalized understanding of plan. Lab order faxed to [facility]

## 2023-11-21 NOTE — Telephone Encounter (Signed)
-----   Message from Teddie Favre sent at 11/19/2023  8:17 PM EST ----- Results reviewed.  Let's add Aldactone  12.5 mg daily to his current regimen.  Can recheck a BMET in 10 days.

## 2023-11-21 NOTE — Telephone Encounter (Signed)
 Patient states she is returning call. Please advise

## 2023-12-02 DIAGNOSIS — Z87891 Personal history of nicotine dependence: Secondary | ICD-10-CM | POA: Diagnosis not present

## 2023-12-02 DIAGNOSIS — I454 Nonspecific intraventricular block: Secondary | ICD-10-CM | POA: Diagnosis present

## 2023-12-02 DIAGNOSIS — F419 Anxiety disorder, unspecified: Secondary | ICD-10-CM | POA: Diagnosis present

## 2023-12-02 DIAGNOSIS — I517 Cardiomegaly: Secondary | ICD-10-CM | POA: Diagnosis not present

## 2023-12-02 DIAGNOSIS — Z20822 Contact with and (suspected) exposure to covid-19: Secondary | ICD-10-CM | POA: Diagnosis not present

## 2023-12-02 DIAGNOSIS — I10 Essential (primary) hypertension: Secondary | ICD-10-CM | POA: Diagnosis present

## 2023-12-02 DIAGNOSIS — R1013 Epigastric pain: Secondary | ICD-10-CM | POA: Diagnosis not present

## 2023-12-02 DIAGNOSIS — Z8709 Personal history of other diseases of the respiratory system: Secondary | ICD-10-CM | POA: Diagnosis not present

## 2023-12-02 DIAGNOSIS — I6782 Cerebral ischemia: Secondary | ICD-10-CM | POA: Diagnosis not present

## 2023-12-02 DIAGNOSIS — R42 Dizziness and giddiness: Secondary | ICD-10-CM | POA: Diagnosis not present

## 2023-12-02 DIAGNOSIS — R0989 Other specified symptoms and signs involving the circulatory and respiratory systems: Secondary | ICD-10-CM | POA: Diagnosis not present

## 2023-12-02 DIAGNOSIS — R4182 Altered mental status, unspecified: Secondary | ICD-10-CM | POA: Diagnosis not present

## 2023-12-02 DIAGNOSIS — J929 Pleural plaque without asbestos: Secondary | ICD-10-CM | POA: Diagnosis not present

## 2023-12-02 DIAGNOSIS — R9089 Other abnormal findings on diagnostic imaging of central nervous system: Secondary | ICD-10-CM | POA: Diagnosis not present

## 2023-12-02 DIAGNOSIS — K219 Gastro-esophageal reflux disease without esophagitis: Secondary | ICD-10-CM | POA: Diagnosis present

## 2023-12-02 DIAGNOSIS — R109 Unspecified abdominal pain: Secondary | ICD-10-CM | POA: Diagnosis not present

## 2023-12-02 DIAGNOSIS — Z91040 Latex allergy status: Secondary | ICD-10-CM | POA: Diagnosis not present

## 2023-12-02 DIAGNOSIS — E8770 Fluid overload, unspecified: Secondary | ICD-10-CM | POA: Diagnosis not present

## 2023-12-02 DIAGNOSIS — I4821 Permanent atrial fibrillation: Secondary | ICD-10-CM | POA: Diagnosis present

## 2023-12-02 DIAGNOSIS — R918 Other nonspecific abnormal finding of lung field: Secondary | ICD-10-CM | POA: Diagnosis not present

## 2023-12-02 DIAGNOSIS — Z8669 Personal history of other diseases of the nervous system and sense organs: Secondary | ICD-10-CM | POA: Diagnosis not present

## 2023-12-02 DIAGNOSIS — Z882 Allergy status to sulfonamides status: Secondary | ICD-10-CM | POA: Diagnosis not present

## 2023-12-02 DIAGNOSIS — I214 Non-ST elevation (NSTEMI) myocardial infarction: Secondary | ICD-10-CM | POA: Diagnosis present

## 2023-12-02 DIAGNOSIS — E871 Hypo-osmolality and hyponatremia: Secondary | ICD-10-CM | POA: Diagnosis not present

## 2023-12-02 DIAGNOSIS — I3481 Nonrheumatic mitral (valve) annulus calcification: Secondary | ICD-10-CM | POA: Diagnosis not present

## 2023-12-02 DIAGNOSIS — F0390 Unspecified dementia without behavioral disturbance: Secondary | ICD-10-CM | POA: Diagnosis present

## 2023-12-02 DIAGNOSIS — I358 Other nonrheumatic aortic valve disorders: Secondary | ICD-10-CM | POA: Diagnosis not present

## 2023-12-02 DIAGNOSIS — I161 Hypertensive emergency: Secondary | ICD-10-CM | POA: Diagnosis present

## 2023-12-02 DIAGNOSIS — M1990 Unspecified osteoarthritis, unspecified site: Secondary | ICD-10-CM | POA: Diagnosis present

## 2023-12-02 DIAGNOSIS — E222 Syndrome of inappropriate secretion of antidiuretic hormone: Secondary | ICD-10-CM | POA: Diagnosis present

## 2023-12-02 DIAGNOSIS — I160 Hypertensive urgency: Secondary | ICD-10-CM | POA: Diagnosis present

## 2023-12-02 DIAGNOSIS — G9341 Metabolic encephalopathy: Secondary | ICD-10-CM | POA: Diagnosis not present

## 2023-12-02 DIAGNOSIS — Z86018 Personal history of other benign neoplasm: Secondary | ICD-10-CM | POA: Diagnosis not present

## 2023-12-02 DIAGNOSIS — Z79899 Other long term (current) drug therapy: Secondary | ICD-10-CM | POA: Diagnosis not present

## 2023-12-02 DIAGNOSIS — J45909 Unspecified asthma, uncomplicated: Secondary | ICD-10-CM | POA: Diagnosis present

## 2023-12-02 DIAGNOSIS — N3000 Acute cystitis without hematuria: Secondary | ICD-10-CM | POA: Diagnosis present

## 2023-12-02 DIAGNOSIS — Z7901 Long term (current) use of anticoagulants: Secondary | ICD-10-CM | POA: Diagnosis not present

## 2023-12-02 DIAGNOSIS — I700 Atherosclerosis of aorta: Secondary | ICD-10-CM | POA: Diagnosis not present

## 2023-12-02 DIAGNOSIS — Z885 Allergy status to narcotic agent status: Secondary | ICD-10-CM | POA: Diagnosis not present

## 2023-12-02 DIAGNOSIS — J949 Pleural condition, unspecified: Secondary | ICD-10-CM | POA: Diagnosis not present

## 2023-12-02 DIAGNOSIS — Z79810 Long term (current) use of selective estrogen receptor modulators (SERMs): Secondary | ICD-10-CM | POA: Diagnosis not present

## 2023-12-02 DIAGNOSIS — R0602 Shortness of breath: Secondary | ICD-10-CM | POA: Diagnosis not present

## 2023-12-02 DIAGNOSIS — Z1152 Encounter for screening for COVID-19: Secondary | ICD-10-CM | POA: Diagnosis not present

## 2023-12-02 DIAGNOSIS — R079 Chest pain, unspecified: Secondary | ICD-10-CM | POA: Diagnosis not present

## 2023-12-02 DIAGNOSIS — E785 Hyperlipidemia, unspecified: Secondary | ICD-10-CM | POA: Diagnosis not present

## 2023-12-04 DIAGNOSIS — N3000 Acute cystitis without hematuria: Secondary | ICD-10-CM | POA: Diagnosis present

## 2023-12-04 DIAGNOSIS — R0602 Shortness of breath: Secondary | ICD-10-CM | POA: Diagnosis not present

## 2023-12-04 DIAGNOSIS — G9341 Metabolic encephalopathy: Secondary | ICD-10-CM | POA: Diagnosis not present

## 2023-12-04 DIAGNOSIS — E222 Syndrome of inappropriate secretion of antidiuretic hormone: Secondary | ICD-10-CM | POA: Diagnosis present

## 2023-12-04 DIAGNOSIS — E785 Hyperlipidemia, unspecified: Secondary | ICD-10-CM | POA: Diagnosis present

## 2023-12-04 DIAGNOSIS — Z885 Allergy status to narcotic agent status: Secondary | ICD-10-CM | POA: Diagnosis not present

## 2023-12-04 DIAGNOSIS — Z87891 Personal history of nicotine dependence: Secondary | ICD-10-CM | POA: Diagnosis not present

## 2023-12-04 DIAGNOSIS — I6782 Cerebral ischemia: Secondary | ICD-10-CM | POA: Diagnosis not present

## 2023-12-04 DIAGNOSIS — Z79899 Other long term (current) drug therapy: Secondary | ICD-10-CM | POA: Diagnosis not present

## 2023-12-04 DIAGNOSIS — J45909 Unspecified asthma, uncomplicated: Secondary | ICD-10-CM | POA: Diagnosis present

## 2023-12-04 DIAGNOSIS — R0989 Other specified symptoms and signs involving the circulatory and respiratory systems: Secondary | ICD-10-CM | POA: Diagnosis not present

## 2023-12-04 DIAGNOSIS — Z20822 Contact with and (suspected) exposure to covid-19: Secondary | ICD-10-CM | POA: Diagnosis not present

## 2023-12-04 DIAGNOSIS — K219 Gastro-esophageal reflux disease without esophagitis: Secondary | ICD-10-CM | POA: Diagnosis present

## 2023-12-04 DIAGNOSIS — Z1152 Encounter for screening for COVID-19: Secondary | ICD-10-CM | POA: Diagnosis not present

## 2023-12-04 DIAGNOSIS — E8770 Fluid overload, unspecified: Secondary | ICD-10-CM | POA: Diagnosis not present

## 2023-12-04 DIAGNOSIS — R079 Chest pain, unspecified: Secondary | ICD-10-CM | POA: Diagnosis not present

## 2023-12-04 DIAGNOSIS — Z8709 Personal history of other diseases of the respiratory system: Secondary | ICD-10-CM | POA: Diagnosis not present

## 2023-12-04 DIAGNOSIS — Z91040 Latex allergy status: Secondary | ICD-10-CM | POA: Diagnosis not present

## 2023-12-04 DIAGNOSIS — I700 Atherosclerosis of aorta: Secondary | ICD-10-CM | POA: Diagnosis not present

## 2023-12-04 DIAGNOSIS — M1990 Unspecified osteoarthritis, unspecified site: Secondary | ICD-10-CM | POA: Diagnosis present

## 2023-12-04 DIAGNOSIS — Z79810 Long term (current) use of selective estrogen receptor modulators (SERMs): Secondary | ICD-10-CM | POA: Diagnosis not present

## 2023-12-04 DIAGNOSIS — I517 Cardiomegaly: Secondary | ICD-10-CM | POA: Diagnosis not present

## 2023-12-04 DIAGNOSIS — Z86018 Personal history of other benign neoplasm: Secondary | ICD-10-CM | POA: Diagnosis not present

## 2023-12-04 DIAGNOSIS — F419 Anxiety disorder, unspecified: Secondary | ICD-10-CM | POA: Diagnosis present

## 2023-12-04 DIAGNOSIS — R4182 Altered mental status, unspecified: Secondary | ICD-10-CM | POA: Diagnosis not present

## 2023-12-04 DIAGNOSIS — Z8669 Personal history of other diseases of the nervous system and sense organs: Secondary | ICD-10-CM | POA: Diagnosis not present

## 2023-12-04 DIAGNOSIS — J949 Pleural condition, unspecified: Secondary | ICD-10-CM | POA: Diagnosis not present

## 2023-12-04 DIAGNOSIS — I10 Essential (primary) hypertension: Secondary | ICD-10-CM | POA: Diagnosis present

## 2023-12-04 DIAGNOSIS — I214 Non-ST elevation (NSTEMI) myocardial infarction: Secondary | ICD-10-CM | POA: Diagnosis present

## 2023-12-04 DIAGNOSIS — I4821 Permanent atrial fibrillation: Secondary | ICD-10-CM | POA: Diagnosis present

## 2023-12-04 DIAGNOSIS — I358 Other nonrheumatic aortic valve disorders: Secondary | ICD-10-CM | POA: Diagnosis not present

## 2023-12-04 DIAGNOSIS — Z7901 Long term (current) use of anticoagulants: Secondary | ICD-10-CM | POA: Diagnosis not present

## 2023-12-04 DIAGNOSIS — I454 Nonspecific intraventricular block: Secondary | ICD-10-CM | POA: Diagnosis present

## 2023-12-04 DIAGNOSIS — E871 Hypo-osmolality and hyponatremia: Secondary | ICD-10-CM | POA: Diagnosis not present

## 2023-12-04 DIAGNOSIS — R918 Other nonspecific abnormal finding of lung field: Secondary | ICD-10-CM | POA: Diagnosis not present

## 2023-12-04 DIAGNOSIS — I160 Hypertensive urgency: Secondary | ICD-10-CM | POA: Diagnosis present

## 2023-12-04 DIAGNOSIS — Z882 Allergy status to sulfonamides status: Secondary | ICD-10-CM | POA: Diagnosis not present

## 2023-12-04 DIAGNOSIS — R1013 Epigastric pain: Secondary | ICD-10-CM | POA: Diagnosis present

## 2023-12-04 DIAGNOSIS — R42 Dizziness and giddiness: Secondary | ICD-10-CM | POA: Diagnosis not present

## 2023-12-04 DIAGNOSIS — R109 Unspecified abdominal pain: Secondary | ICD-10-CM | POA: Diagnosis not present

## 2023-12-04 DIAGNOSIS — I3481 Nonrheumatic mitral (valve) annulus calcification: Secondary | ICD-10-CM | POA: Diagnosis not present

## 2023-12-04 DIAGNOSIS — J929 Pleural plaque without asbestos: Secondary | ICD-10-CM | POA: Diagnosis not present

## 2023-12-04 DIAGNOSIS — I161 Hypertensive emergency: Secondary | ICD-10-CM | POA: Diagnosis present

## 2023-12-04 DIAGNOSIS — F0390 Unspecified dementia without behavioral disturbance: Secondary | ICD-10-CM | POA: Diagnosis present

## 2023-12-04 DIAGNOSIS — R9089 Other abnormal findings on diagnostic imaging of central nervous system: Secondary | ICD-10-CM | POA: Diagnosis not present

## 2023-12-05 ENCOUNTER — Telehealth: Payer: Self-pay | Admitting: Cardiology

## 2023-12-05 NOTE — Telephone Encounter (Signed)
 Will forward to provider for review.

## 2023-12-05 NOTE — Telephone Encounter (Signed)
Patient's wife informed and verbalized understanding of plan.

## 2023-12-05 NOTE — Telephone Encounter (Signed)
Pt c/o BP issue: STAT if pt c/o blurred vision, one-sided weakness or slurred speech  1. What are your last 5 BP readings?  Yesterday was in the 200's/100's, per wife  2. Are you having any other symptoms (ex. Dizziness, headache, blurred vision, passed out)?  Weakness, numbness in legs, patient felt like he could fall out per wife  3. What is your BP issue?   Yesterday BP was high and patient went to [facility] in [place]. He's currently admitted and wife would like to have Dr. Diona Browner review records if possible.

## 2023-12-10 ENCOUNTER — Encounter (HOSPITAL_COMMUNITY): Payer: Self-pay

## 2023-12-10 ENCOUNTER — Telehealth: Payer: Self-pay | Admitting: Internal Medicine

## 2023-12-10 NOTE — Telephone Encounter (Signed)
 I received a page from [facility] regarding this patient, asked to speak with Dr. Emmit Pomfret about possible inpatient transfer to [facility] for management of NSTEMI.    Julian Fowler is an 89 yo gentleman who follows with Dr. Diona Browner for management of permanent atrial fibrillation and mild calcific aortic stenosis.  He has known coronary artery calcifications.    He has been admitted to [facility] since 12/04/2023 for management of hypertensive emergency.  He has been in the ICU on nitroglycerin infusion.  He has also been treated for delirium, agitation, and suspected urinary tract infection and currently remains under ICU level of care.   Earlier this evening, he complained of crushing chest pain and found to have an elevated troponin to 1,819.  He was treated with nitroglycerin, aspirin, full dose lovenox, and metoprolol.  ECG reportedly demonstrated ST depressions without elevations (unable to review tracing).   Given his multiple comorbidities and complexity of his presentation, feel as though he would benefit from primary inpatient medicine service, and we would be happy to consult on him for primary NSTEMI evaluation/management.    [facility] and Dr. Emmit Pomfret will work on arranging conversation with medicine for inpatient transfer.    Please page the on call cardiology consult service when patient arrives so that we can see him and assist with management.   Victory Dakin, MD  Cardiology

## 2023-12-11 ENCOUNTER — Inpatient Hospital Stay (HOSPITAL_COMMUNITY)
Admission: EM | Admit: 2023-12-11 | Discharge: 2023-12-19 | DRG: 280 | Disposition: A | Discharge status: Rehabilitation Facility | Payer: Medicare Other | Source: Other Acute Inpatient Hospital | Attending: Internal Medicine | Admitting: Internal Medicine

## 2023-12-11 ENCOUNTER — Other Ambulatory Visit: Payer: Self-pay

## 2023-12-11 DIAGNOSIS — Z8249 Family history of ischemic heart disease and other diseases of the circulatory system: Secondary | ICD-10-CM | POA: Diagnosis not present

## 2023-12-11 DIAGNOSIS — Z87891 Personal history of nicotine dependence: Secondary | ICD-10-CM

## 2023-12-11 DIAGNOSIS — Z888 Allergy status to other drugs, medicaments and biological substances status: Secondary | ICD-10-CM

## 2023-12-11 DIAGNOSIS — Z7951 Long term (current) use of inhaled steroids: Secondary | ICD-10-CM

## 2023-12-11 DIAGNOSIS — I2510 Atherosclerotic heart disease of native coronary artery without angina pectoris: Secondary | ICD-10-CM | POA: Diagnosis not present

## 2023-12-11 DIAGNOSIS — Z811 Family history of alcohol abuse and dependence: Secondary | ICD-10-CM

## 2023-12-11 DIAGNOSIS — F419 Anxiety disorder, unspecified: Secondary | ICD-10-CM | POA: Diagnosis present

## 2023-12-11 DIAGNOSIS — I77810 Thoracic aortic ectasia: Secondary | ICD-10-CM | POA: Diagnosis present

## 2023-12-11 DIAGNOSIS — I214 Non-ST elevation (NSTEMI) myocardial infarction: Principal | ICD-10-CM | POA: Diagnosis present

## 2023-12-11 DIAGNOSIS — Z86018 Personal history of other benign neoplasm: Secondary | ICD-10-CM | POA: Diagnosis not present

## 2023-12-11 DIAGNOSIS — K219 Gastro-esophageal reflux disease without esophagitis: Secondary | ICD-10-CM | POA: Diagnosis present

## 2023-12-11 DIAGNOSIS — Z96653 Presence of artificial knee joint, bilateral: Secondary | ICD-10-CM | POA: Diagnosis present

## 2023-12-11 DIAGNOSIS — N138 Other obstructive and reflux uropathy: Secondary | ICD-10-CM | POA: Diagnosis not present

## 2023-12-11 DIAGNOSIS — I161 Hypertensive emergency: Secondary | ICD-10-CM | POA: Diagnosis present

## 2023-12-11 DIAGNOSIS — W010XXA Fall on same level from slipping, tripping and stumbling without subsequent striking against object, initial encounter: Secondary | ICD-10-CM | POA: Diagnosis present

## 2023-12-11 DIAGNOSIS — E785 Hyperlipidemia, unspecified: Secondary | ICD-10-CM | POA: Diagnosis present

## 2023-12-11 DIAGNOSIS — E782 Mixed hyperlipidemia: Secondary | ICD-10-CM

## 2023-12-11 DIAGNOSIS — Z7982 Long term (current) use of aspirin: Secondary | ICD-10-CM

## 2023-12-11 DIAGNOSIS — R5381 Other malaise: Secondary | ICD-10-CM | POA: Diagnosis not present

## 2023-12-11 DIAGNOSIS — J4520 Mild intermittent asthma, uncomplicated: Secondary | ICD-10-CM | POA: Diagnosis present

## 2023-12-11 DIAGNOSIS — J61 Pneumoconiosis due to asbestos and other mineral fibers: Secondary | ICD-10-CM | POA: Diagnosis not present

## 2023-12-11 DIAGNOSIS — R4182 Altered mental status, unspecified: Secondary | ICD-10-CM | POA: Diagnosis not present

## 2023-12-11 DIAGNOSIS — R061 Stridor: Secondary | ICD-10-CM | POA: Diagnosis not present

## 2023-12-11 DIAGNOSIS — Z8709 Personal history of other diseases of the respiratory system: Secondary | ICD-10-CM | POA: Diagnosis not present

## 2023-12-11 DIAGNOSIS — R0603 Acute respiratory distress: Secondary | ICD-10-CM | POA: Diagnosis not present

## 2023-12-11 DIAGNOSIS — H9192 Unspecified hearing loss, left ear: Secondary | ICD-10-CM | POA: Diagnosis not present

## 2023-12-11 DIAGNOSIS — Z803 Family history of malignant neoplasm of breast: Secondary | ICD-10-CM

## 2023-12-11 DIAGNOSIS — D333 Benign neoplasm of cranial nerves: Secondary | ICD-10-CM | POA: Diagnosis not present

## 2023-12-11 DIAGNOSIS — Z79899 Other long term (current) drug therapy: Secondary | ICD-10-CM

## 2023-12-11 DIAGNOSIS — S82852A Displaced trimalleolar fracture of left lower leg, initial encounter for closed fracture: Secondary | ICD-10-CM

## 2023-12-11 DIAGNOSIS — F03A4 Unspecified dementia, mild, with anxiety: Secondary | ICD-10-CM | POA: Diagnosis present

## 2023-12-11 DIAGNOSIS — I700 Atherosclerosis of aorta: Secondary | ICD-10-CM | POA: Diagnosis present

## 2023-12-11 DIAGNOSIS — S8252XA Displaced fracture of medial malleolus of left tibia, initial encounter for closed fracture: Secondary | ICD-10-CM | POA: Diagnosis not present

## 2023-12-11 DIAGNOSIS — N401 Enlarged prostate with lower urinary tract symptoms: Secondary | ICD-10-CM | POA: Diagnosis not present

## 2023-12-11 DIAGNOSIS — Z7902 Long term (current) use of antithrombotics/antiplatelets: Secondary | ICD-10-CM | POA: Diagnosis not present

## 2023-12-11 DIAGNOSIS — S82402A Unspecified fracture of shaft of left fibula, initial encounter for closed fracture: Secondary | ICD-10-CM | POA: Diagnosis not present

## 2023-12-11 DIAGNOSIS — R001 Bradycardia, unspecified: Secondary | ICD-10-CM | POA: Diagnosis present

## 2023-12-11 DIAGNOSIS — R601 Generalized edema: Secondary | ICD-10-CM | POA: Diagnosis not present

## 2023-12-11 DIAGNOSIS — G9341 Metabolic encephalopathy: Secondary | ICD-10-CM | POA: Diagnosis not present

## 2023-12-11 DIAGNOSIS — J849 Interstitial pulmonary disease, unspecified: Secondary | ICD-10-CM | POA: Diagnosis not present

## 2023-12-11 DIAGNOSIS — N3000 Acute cystitis without hematuria: Secondary | ICD-10-CM | POA: Diagnosis not present

## 2023-12-11 DIAGNOSIS — M19071 Primary osteoarthritis, right ankle and foot: Secondary | ICD-10-CM | POA: Diagnosis not present

## 2023-12-11 DIAGNOSIS — Z91040 Latex allergy status: Secondary | ICD-10-CM

## 2023-12-11 DIAGNOSIS — Z8619 Personal history of other infectious and parasitic diseases: Secondary | ICD-10-CM

## 2023-12-11 DIAGNOSIS — S82852D Displaced trimalleolar fracture of left lower leg, subsequent encounter for closed fracture with routine healing: Secondary | ICD-10-CM | POA: Diagnosis not present

## 2023-12-11 DIAGNOSIS — N390 Urinary tract infection, site not specified: Secondary | ICD-10-CM | POA: Diagnosis not present

## 2023-12-11 DIAGNOSIS — M2041 Other hammer toe(s) (acquired), right foot: Secondary | ICD-10-CM | POA: Diagnosis not present

## 2023-12-11 DIAGNOSIS — M19072 Primary osteoarthritis, left ankle and foot: Secondary | ICD-10-CM | POA: Diagnosis not present

## 2023-12-11 DIAGNOSIS — Y92009 Unspecified place in unspecified non-institutional (private) residence as the place of occurrence of the external cause: Secondary | ICD-10-CM | POA: Diagnosis not present

## 2023-12-11 DIAGNOSIS — I160 Hypertensive urgency: Secondary | ICD-10-CM | POA: Diagnosis not present

## 2023-12-11 DIAGNOSIS — R079 Chest pain, unspecified: Secondary | ICD-10-CM | POA: Diagnosis not present

## 2023-12-11 DIAGNOSIS — Z89022 Acquired absence of left finger(s): Secondary | ICD-10-CM

## 2023-12-11 DIAGNOSIS — F05 Delirium due to known physiological condition: Secondary | ICD-10-CM | POA: Diagnosis not present

## 2023-12-11 DIAGNOSIS — Z820 Family history of epilepsy and other diseases of the nervous system: Secondary | ICD-10-CM

## 2023-12-11 DIAGNOSIS — Z6835 Body mass index (BMI) 35.0-35.9, adult: Secondary | ICD-10-CM

## 2023-12-11 DIAGNOSIS — G4733 Obstructive sleep apnea (adult) (pediatric): Secondary | ICD-10-CM | POA: Diagnosis present

## 2023-12-11 DIAGNOSIS — Z882 Allergy status to sulfonamides status: Secondary | ICD-10-CM

## 2023-12-11 DIAGNOSIS — Z885 Allergy status to narcotic agent status: Secondary | ICD-10-CM

## 2023-12-11 DIAGNOSIS — I4821 Permanent atrial fibrillation: Secondary | ICD-10-CM | POA: Diagnosis present

## 2023-12-11 DIAGNOSIS — D649 Anemia, unspecified: Secondary | ICD-10-CM | POA: Diagnosis present

## 2023-12-11 DIAGNOSIS — Z841 Family history of disorders of kidney and ureter: Secondary | ICD-10-CM

## 2023-12-11 DIAGNOSIS — M7731 Calcaneal spur, right foot: Secondary | ICD-10-CM | POA: Diagnosis not present

## 2023-12-11 DIAGNOSIS — Z923 Personal history of irradiation: Secondary | ICD-10-CM

## 2023-12-11 DIAGNOSIS — E871 Hypo-osmolality and hyponatremia: Secondary | ICD-10-CM | POA: Diagnosis not present

## 2023-12-11 DIAGNOSIS — Z87440 Personal history of urinary (tract) infections: Secondary | ICD-10-CM

## 2023-12-11 DIAGNOSIS — E66812 Obesity, class 2: Secondary | ICD-10-CM | POA: Diagnosis not present

## 2023-12-11 DIAGNOSIS — I080 Rheumatic disorders of both mitral and aortic valves: Secondary | ICD-10-CM | POA: Diagnosis not present

## 2023-12-11 DIAGNOSIS — I10 Essential (primary) hypertension: Secondary | ICD-10-CM | POA: Diagnosis present

## 2023-12-11 DIAGNOSIS — M7661 Achilles tendinitis, right leg: Secondary | ICD-10-CM | POA: Diagnosis not present

## 2023-12-11 DIAGNOSIS — R1013 Epigastric pain: Secondary | ICD-10-CM | POA: Diagnosis not present

## 2023-12-11 DIAGNOSIS — J4489 Other specified chronic obstructive pulmonary disease: Secondary | ICD-10-CM | POA: Diagnosis not present

## 2023-12-11 DIAGNOSIS — Z808 Family history of malignant neoplasm of other organs or systems: Secondary | ICD-10-CM

## 2023-12-11 DIAGNOSIS — M7989 Other specified soft tissue disorders: Secondary | ICD-10-CM | POA: Diagnosis not present

## 2023-12-11 DIAGNOSIS — S82432A Displaced oblique fracture of shaft of left fibula, initial encounter for closed fracture: Secondary | ICD-10-CM | POA: Diagnosis not present

## 2023-12-11 DIAGNOSIS — Z881 Allergy status to other antibiotic agents status: Secondary | ICD-10-CM

## 2023-12-11 DIAGNOSIS — N400 Enlarged prostate without lower urinary tract symptoms: Secondary | ICD-10-CM | POA: Diagnosis not present

## 2023-12-11 DIAGNOSIS — M47814 Spondylosis without myelopathy or radiculopathy, thoracic region: Secondary | ICD-10-CM | POA: Diagnosis present

## 2023-12-11 DIAGNOSIS — Z792 Long term (current) use of antibiotics: Secondary | ICD-10-CM | POA: Diagnosis not present

## 2023-12-11 DIAGNOSIS — Z9049 Acquired absence of other specified parts of digestive tract: Secondary | ICD-10-CM

## 2023-12-11 DIAGNOSIS — Z9181 History of falling: Secondary | ICD-10-CM

## 2023-12-11 DIAGNOSIS — G4700 Insomnia, unspecified: Secondary | ICD-10-CM | POA: Diagnosis not present

## 2023-12-11 DIAGNOSIS — W010XXD Fall on same level from slipping, tripping and stumbling without subsequent striking against object, subsequent encounter: Secondary | ICD-10-CM | POA: Diagnosis present

## 2023-12-11 DIAGNOSIS — M79671 Pain in right foot: Secondary | ICD-10-CM | POA: Diagnosis not present

## 2023-12-11 LAB — CBC WITH DIFFERENTIAL/PLATELET
Abs Immature Granulocytes: 0.07 10*3/uL (ref 0.00–0.07)
Basophils Absolute: 0.1 10*3/uL (ref 0.0–0.1)
Basophils Relative: 1 %
Eosinophils Absolute: 1.1 10*3/uL — ABNORMAL HIGH (ref 0.0–0.5)
Eosinophils Relative: 13 %
HCT: 35.7 % — ABNORMAL LOW (ref 39.0–52.0)
Hemoglobin: 12.2 g/dL — ABNORMAL LOW (ref 13.0–17.0)
Immature Granulocytes: 1 %
Lymphocytes Relative: 12 %
Lymphs Abs: 0.9 10*3/uL (ref 0.7–4.0)
MCH: 30.2 pg (ref 26.0–34.0)
MCHC: 34.2 g/dL (ref 30.0–36.0)
MCV: 88.4 fL (ref 80.0–100.0)
Monocytes Absolute: 0.8 10*3/uL (ref 0.1–1.0)
Monocytes Relative: 10 %
Neutro Abs: 5.1 10*3/uL (ref 1.7–7.7)
Neutrophils Relative %: 63 %
Platelets: 398 10*3/uL (ref 150–400)
RBC: 4.04 MIL/uL — ABNORMAL LOW (ref 4.22–5.81)
RDW: 13.8 % (ref 11.5–15.5)
WBC: 8.1 10*3/uL (ref 4.0–10.5)
nRBC: 0 % (ref 0.0–0.2)

## 2023-12-11 LAB — COMPREHENSIVE METABOLIC PANEL
ALT: 47 U/L — ABNORMAL HIGH (ref 0–44)
AST: 100 U/L — ABNORMAL HIGH (ref 15–41)
Albumin: 2.5 g/dL — ABNORMAL LOW (ref 3.5–5.0)
Alkaline Phosphatase: 72 U/L (ref 38–126)
Anion gap: 12 (ref 5–15)
BUN: 10 mg/dL (ref 8–23)
CO2: 23 mmol/L (ref 22–32)
Calcium: 8.5 mg/dL — ABNORMAL LOW (ref 8.9–10.3)
Chloride: 95 mmol/L — ABNORMAL LOW (ref 98–111)
Creatinine, Ser: 0.79 mg/dL (ref 0.61–1.24)
GFR, Estimated: >60 mL/min (ref >60)
Glucose, Bld: 116 mg/dL — ABNORMAL HIGH (ref 70–99)
Potassium: 4.2 mmol/L (ref 3.5–5.1)
Sodium: 130 mmol/L — ABNORMAL LOW (ref 135–145)
Total Bilirubin: 0.4 mg/dL (ref 0.0–1.2)
Total Protein: 5.9 g/dL — ABNORMAL LOW (ref 6.5–8.1)

## 2023-12-11 LAB — TROPONIN I (HIGH SENSITIVITY): Troponin I (High Sensitivity): 7237 ng/L (ref <18)

## 2023-12-11 MED ORDER — IRBESARTAN 75 MG PO TABS
75.0000 mg | ORAL_TABLET | Freq: Every day | ORAL | Status: DC
  Administered 2023-12-11 – 2023-12-13 (×3): 75 mg via ORAL
  Filled 2023-12-11 (×3): qty 1

## 2023-12-11 MED ORDER — PANTOPRAZOLE SODIUM 40 MG PO TBEC
40.0000 mg | DELAYED_RELEASE_TABLET | Freq: Every day | ORAL | Status: DC
  Administered 2023-12-12 – 2023-12-19 (×8): 40 mg via ORAL
  Filled 2023-12-11 (×8): qty 1

## 2023-12-11 MED ORDER — ALBUTEROL SULFATE HFA 108 (90 BASE) MCG/ACT IN AERS: 1.0000 | INHALATION_SPRAY | Freq: Four times a day (QID) | RESPIRATORY_TRACT | Status: DC | PRN

## 2023-12-11 MED ORDER — GUAIFENESIN ER 600 MG PO TB12
1200.0000 mg | ORAL_TABLET | Freq: Two times a day (BID) | ORAL | Status: DC
  Administered 2023-12-11 – 2023-12-19 (×16): 1200 mg via ORAL
  Filled 2023-12-11 (×16): qty 2

## 2023-12-11 MED ORDER — LOSARTAN POTASSIUM 50 MG PO TABS: 100.0000 mg | ORAL_TABLET | Freq: Every morning | ORAL | Status: DC

## 2023-12-11 MED ORDER — SODIUM CHLORIDE 1 G PO TABS
1.0000 g | ORAL_TABLET | Freq: Three times a day (TID) | ORAL | Status: DC
  Administered 2023-12-11 – 2023-12-19 (×25): 1 g via ORAL
  Filled 2023-12-11 (×25): qty 1

## 2023-12-11 MED ORDER — ONDANSETRON HCL 4 MG/2ML IJ SOLN: 4.0000 mg | Freq: Four times a day (QID) | INTRAMUSCULAR | Status: DC | PRN

## 2023-12-11 MED ORDER — PRAVASTATIN SODIUM 40 MG PO TABS
40.0000 mg | ORAL_TABLET | Freq: Every day | ORAL | Status: DC
  Administered 2023-12-11 – 2023-12-18 (×8): 40 mg via ORAL
  Filled 2023-12-11 (×9): qty 1

## 2023-12-11 MED ORDER — AMLODIPINE BESYLATE 5 MG PO TABS
5.0000 mg | ORAL_TABLET | Freq: Every day | ORAL | Status: DC
  Administered 2023-12-11 – 2023-12-12 (×2): 5 mg via ORAL
  Filled 2023-12-11: qty 1
  Filled 2023-12-11: qty 2

## 2023-12-11 MED ORDER — SPIRONOLACTONE 25 MG PO TABS
25.0000 mg | ORAL_TABLET | Freq: Every day | ORAL | Status: DC
  Administered 2023-12-12 – 2023-12-19 (×8): 25 mg via ORAL
  Filled 2023-12-11 (×8): qty 1

## 2023-12-11 MED ORDER — AMLODIPINE BESYLATE 10 MG PO TABS: 10.0000 mg | ORAL_TABLET | Freq: Every day | ORAL | Status: DC

## 2023-12-11 MED ORDER — HYDRALAZINE HCL 50 MG PO TABS
100.0000 mg | ORAL_TABLET | Freq: Three times a day (TID) | ORAL | Status: DC
  Administered 2023-12-11 – 2023-12-19 (×24): 100 mg via ORAL
  Filled 2023-12-11 (×24): qty 2

## 2023-12-11 MED ORDER — MOMETASONE FURO-FORMOTEROL FUM 200-5 MCG/ACT IN AERO
2.0000 | INHALATION_SPRAY | Freq: Two times a day (BID) | RESPIRATORY_TRACT | Status: DC
  Administered 2023-12-11 – 2023-12-19 (×16): 2 via RESPIRATORY_TRACT
  Filled 2023-12-11: qty 8.8

## 2023-12-11 MED ORDER — HEPARIN (PORCINE) 25000 UT/250ML-% IV SOLN
1000.0000 [IU]/h | INTRAVENOUS | Status: DC
  Administered 2023-12-11: 1100 [IU]/h via INTRAVENOUS
  Filled 2023-12-11: qty 250

## 2023-12-11 MED ORDER — HEPARIN SODIUM (PORCINE) 5000 UNIT/ML IJ SOLN: 5000.0000 [IU] | Freq: Three times a day (TID) | INTRAMUSCULAR | Status: DC

## 2023-12-11 MED ORDER — NITROGLYCERIN 0.4 MG SL SUBL
0.4000 mg | SUBLINGUAL_TABLET | SUBLINGUAL | Status: DC | PRN
  Administered 2023-12-12: 0.4 mg via SUBLINGUAL
  Filled 2023-12-11: qty 1

## 2023-12-11 MED ORDER — ACETAMINOPHEN 325 MG PO TABS
650.0000 mg | ORAL_TABLET | ORAL | Status: DC | PRN
  Administered 2023-12-14 – 2023-12-19 (×9): 650 mg via ORAL
  Filled 2023-12-11 (×10): qty 2

## 2023-12-11 MED ORDER — ALBUTEROL SULFATE (2.5 MG/3ML) 0.083% IN NEBU
2.5000 mg | INHALATION_SOLUTION | Freq: Four times a day (QID) | RESPIRATORY_TRACT | Status: DC | PRN
  Filled 2023-12-11: qty 3

## 2023-12-11 MED ORDER — QUETIAPINE FUMARATE 50 MG PO TABS
50.0000 mg | ORAL_TABLET | Freq: Every day | ORAL | Status: DC
  Administered 2023-12-11 – 2023-12-18 (×8): 50 mg via ORAL
  Filled 2023-12-11 (×8): qty 1

## 2023-12-11 MED ORDER — SODIUM CHLORIDE 0.9 % IV SOLN
1.0000 g | INTRAVENOUS | Status: DC
  Administered 2023-12-12 – 2023-12-15 (×4): 1 g via INTRAVENOUS
  Filled 2023-12-11 (×5): qty 10

## 2023-12-11 NOTE — Progress Notes (Signed)
 Critical lab result- troponin of 7237 communicated with Dr. Alinda Money.

## 2023-12-11 NOTE — H&P (Signed)
 History and Physical   Julian Fowler BJY:[id number] DOB: Jun 22, 1935 DOA: 12/11/2023  PCP: Juliette Alcide, MD   Patient coming from: [facility]  Chief Complaint: NSTEMI  HPI: Julian Fowler is a 89 y.o. male with medical history significant of hypertension, hyperlipidemia, GERD, A-fib, cerebral vascular AV fistula, acoustic neuroma, BPH, asthma, anxiety, ILD, asbestosis, OSA who is presenting with NSTEMI from outside hospital.  Patient initially presented to outside hospital on 2/19.  Patient presented with severely elevated blood pressure and complaints of fogginess, dizziness, lightheadedness.  Was started on treatment for hypertensive emergency including nitroglycerin infusion.  He was weaned off this to p.o. medications.  Course was complicated by some agitation requiring Haldol.  Thought possibly secondary to sundowning, had a negative MRI.  Urinalysis suspicious for UTI as possible contributing factor.  Started on ceftriaxone.  Developed significant chest pain yesterday 2/25.  EKG showed evidence of ST depression diffusely and troponin trend was 1800, 2200.  He had already had echocardiogram done there but they were unable to do catheterization.  We were consulted to be primary team while cardiology was consulted to evaluate and treat for NSTEMI upon arrival.  Patient accepted and arrived to the floor 2/26.  Course: Vital signs per chart review on day of transfer were stable other than blood pressure in the 140s.  Lab workup on 2/25 showed sodium 132 on BMP but otherwise within normal limits.  CBC showed hemoglobin 12.2.  Troponin continue to elevate with overall trend being 1800, 2200, 5200, 7500.  Urinalysis done on 2/24 also showed nitrates and leukocytes.  Today should be day 3 of ceftriaxone, will try to determine if he has received his dose for today.  Review of Systems: As per HPI otherwise all other systems reviewed and are negative.  Past Medical History:  Diagnosis  Date   Allergic rhinitis    Aortic stenosis    Arthritis    Atrial fibrillation (HCC)    BPH (benign prostatic hyperplasia)    Coronary artery calcification seen on CT scan    Easy bruisability    ED (erectile dysfunction)    Essential hypertension    GERD (gastroesophageal reflux disease)    H/O hiatal hernia    Hearing loss in left ear    History of asbestosis    History of shingles    Hyperlipidemia    Overactive bladder    PSVT (paroxysmal supraventricular tachycardia) (HCC)    Sleep apnea    No longer on CPAP following weight loss   Tinnitus     Past Surgical History:  Procedure Laterality Date   BACK SURGERY  2014   herniated L1, L2   Dr Channing Mutters   BIOPSY  09/18/2023   Procedure: BIOPSY;  Surgeon: Corbin Ade, MD;  Location: [facility];  Service: Endoscopy;;   BRAIN SURGERY     CEREBRAL EMBOLIZATION  12/2011   "radiation therapy-did not work"   CHOLECYSTECTOMY  6/98   COLONOSCOPY  09/2009   Dr. Cleotis Nipper: normal, internal hemorrhoids    COLONOSCOPY N/A 02/28/2017   Dr. Jena Gauss: Hemorrhoids, grade 3, mild diverticulosis.   CRANIECTOMY FOR EXCISION OF ACOUSTIC NEUROMA  3/95   ESOPHAGOGASTRODUODENOSCOPY (EGD) WITH PROPOFOL N/A 09/18/2023   Procedure: ESOPHAGOGASTRODUODENOSCOPY (EGD) WITH PROPOFOL;  Surgeon: Corbin Ade, MD;  Location: [facility];  Service: Endoscopy;  Laterality: N/A;  930am, asa 3   MINOR AMPUTATION OF DIGIT Left 12/31/2018   Procedure: REVISION AMPUTATION OF LEFT INDEX FINGER, IRRIGATION AND DEBRIDEMENT  LEFT INDEX FINGER;  Surgeon: Ernest Mallick, MD;  Location: [facility] OR;  Service: Orthopedics;  Laterality: Left;   POLYPECTOMY  09/18/2023   Procedure: POLYPECTOMY;  Surgeon: Corbin Ade, MD;  Location: [facility];  Service: Endoscopy;;   RADIOLOGY WITH ANESTHESIA N/A 07/07/2014   Procedure: EMBOLIZATION;  Surgeon: Oneal Grout, MD;  Location: [facility] OR;  Service: Radiology;  Laterality: N/A;   TOTAL KNEE ARTHROPLASTY Left 07/06/2013    Procedure: LEFT TOTAL KNEE ARTHROPLASTY;  Surgeon: Loanne Drilling, MD;  Location: [facility];  Service: Orthopedics;  Laterality: Left;   TOTAL KNEE ARTHROPLASTY Right 01/18/2014   Procedure: RIGHT TOTAL KNEE ARTHROPLASTY;  Surgeon: Loanne Drilling, MD;  Location: [facility];  Service: Orthopedics;  Laterality: Right;   TRIGGER FINGER RELEASE  2003   (thumb) middle finger (2006)    Social History  reports that he quit smoking about 37 years ago. His smoking use included cigarettes. He started smoking about 66 years ago. He has a 45 pack-year smoking history. He has never been exposed to tobacco smoke. He has never used smokeless tobacco. He reports that he does not drink alcohol and does not use drugs.  Allergies  Allergen Reactions   Latex Itching   Other     Other Reaction(s): Other  Skin tears, use paper tape   Bactrim [Sulfamethoxazole-Trimethoprim]    Prednisone     Pt is high functioning and out of sorts.   Codeine Other (See Comments)    REACTION: groggy   Tape Other (See Comments)    Skin tears, use paper tape    Family History  Problem Relation Age of Onset   Cancer Father        oral cancer   Breast cancer Mother    Heart attack Mother    Hypertension Sister        Bypass x4   Alcohol abuse Sister    Alzheimer's disease Sister    Kidney disease Sister    Colon cancer Neg Hx   Reviewed on admission  Prior to Admission medications   Medication Sig Start Date End Date Taking? Authorizing Provider  acetaminophen (TYLENOL) 325 MG tablet Take 325 mg by mouth as needed.    [provider]  azelastine (ASTELIN) 0.1 % nasal spray 1-2 puffs each nostril every 8 hours if needed for drainage 12/17/17   Jetty Duhamel D, MD  Calcium Carb-Vit D-C-E-Mineral (OS-CAL ULTRA) 600 MG TABS Take 1 tablet by mouth daily.     [provider]  Cholecalciferol (VITAMIN D3) 25 MCG (1000 UT) CAPS Take 1 capsule by mouth daily.    [provider]  diclofenac Sodium  (VOLTAREN) 1 % GEL Apply 2 g topically daily as needed (For pain). 03/08/20   [provider]  esomeprazole (NEXIUM) 40 MG capsule Take 1 capsule (40 mg total) by mouth 2 (two) times daily before a meal. 11/19/23   Tiffany Kocher, PA-C  fexofenadine (ALLEGRA) 180 MG tablet Take 180 mg by mouth daily as needed for allergies.    [provider]  fluticasone (FLONASE) 50 MCG/ACT nasal spray Place 2 sprays into both nostrils as needed for allergies or rhinitis.    [provider]  hydrALAZINE (APRESOLINE) 50 MG tablet TAKE 1 & 1/2 (ONE & ONE-HALF) TABLETS BY MOUTH THREE TIMES DAILY 06/24/23   Jonelle Sidle, MD  latanoprost (XALATAN) 0.005 % ophthalmic solution Place 1 drop into both eyes at bedtime.  09/01/12   [provider]  LORazepam (ATIVAN) 1 MG tablet TAKE 1 TABLET BY MOUTH THREE TIMES DAILY AS NEEDED FOR ANXIETY 06/24/23   Young, Joni Fears D, MD  losartan (COZAAR) 100 MG tablet Take 100 mg by mouth every morning.     [provider]  memantine (NAMENDA) 5 MG tablet Take 5 mg by mouth 2 (two) times daily. 10/26/23   [provider]  Multiple Vitamins-Minerals (CENTRUM SILVER PO) Take 1 tablet by mouth daily.     [provider]  Polyethyl Glycol-Propyl Glycol (SYSTANE OP) Place 1 drop into both eyes daily.    [provider]  polyethylene glycol (MIRALAX / GLYCOLAX) packet Take 17 g by mouth at bedtime.    [provider]  potassium chloride SA (KLOR-CON M) 20 MEQ tablet Take 1 tablet (20 mEq total) by mouth daily. 05/08/23   Jonelle Sidle, MD  pravastatin (PRAVACHOL) 40 MG tablet Take 1 tablet by mouth at bedtime.  10/05/14   [provider]  PROAIR HFA 108 (90 Base) MCG/ACT inhaler INHALE 2 PUFFS BY MOUTH EVERY 6 HOURS AS NEEDED FOR WHEEZING FOR SHORTNESS OF BREATH 03/18/20   Jetty Duhamel D, MD  silodosin (RAPAFLO) 8 MG CAPS capsule Take 1 capsule (8 mg total) by mouth at bedtime. 06/26/23   McKenzie, Mardene Celeste, MD  sodium chloride 1 g tablet Take 1 g by mouth 3 (three) times daily. 02/08/21   [provider]  spironolactone (ALDACTONE) 25 MG tablet Take 0.5 tablets (12.5 mg total) by mouth daily. 11/21/23   Jonelle Sidle, MD  SYMBICORT 160-4.5 MCG/ACT inhaler INHALE 2 PUFFS BY MOUTH  TWICE DAILY THEN  RINSE  MOUTH 01/31/21   Jetty Duhamel D, MD  tadalafil (CIALIS) 20 MG tablet Take 1 tablet (20 mg total) by mouth daily as needed. 05/30/22   McKenzie, Mardene Celeste, MD  Wheat Dextrin (BENEFIBER) POWD Take 1 packet by mouth 2 (two) times daily.    [provider]  Zinc 25 MG TABS Take 1 tablet by mouth daily.    [provider]  zolpidem (AMBIEN) 10 MG tablet Take 1 tablet by mouth at bedtime.  01/24/16   [provider]    Physical Exam: Vitals:   12/11/23 1254 12/11/23 1312  BP: (!) 152/84 (!) 152/84  Pulse: 92 92  Resp:  18  Temp: 97.9 F (36.6 C) 97.9 F (36.6 C)  TempSrc: Oral Oral  SpO2: 98%   Weight: 108 kg 108 kg  Height: 5\' 9"  (1.753 m) 5\' 9"  (1.753 m)    Physical Exam Constitutional:      General: He is not in acute distress.    Appearance: Normal appearance. He is obese.  HENT:     Head: Normocephalic and atraumatic.     Mouth/Throat:     Mouth: Mucous membranes are moist.     Pharynx: Oropharynx is clear.  Eyes:     Extraocular Movements: Extraocular movements intact.     Pupils: Pupils are equal, round, and reactive to light.  Cardiovascular:     Rate and Rhythm: Normal rate and regular rhythm.     Pulses: Normal pulses.     Heart sounds: Normal heart sounds.  Pulmonary:     Effort: Pulmonary effort is normal. No respiratory distress.     Breath sounds: Normal breath sounds.  Abdominal:     General: Bowel sounds are normal. There is no distension.     Palpations: Abdomen is soft.     Tenderness:  There is no abdominal tenderness.  Musculoskeletal:        General: No swelling or deformity.  Skin:    General: Skin is warm and dry.   Neurological:     General: No focal deficit present.     Mental Status: Mental status is at baseline.     Labs on Admission: I have personally reviewed following labs and imaging studies  CBC: No results for input(s): "WBC", "NEUTROABS", "HGB", "HCT", "MCV", "PLT" in the last 168 hours.  Basic Metabolic Panel: No results for input(s): "NA", "K", "CL", "CO2", "GLUCOSE", "BUN", "CREATININE", "CALCIUM", "MG", "PHOS" in the last 168 hours.  GFR: CrCl cannot be calculated (Patient's most recent lab result is older than the maximum 21 days allowed.).  Liver Function Tests: No results for input(s): "AST", "ALT", "ALKPHOS", "BILITOT", "PROT", "ALBUMIN" in the last 168 hours.  Urine analysis:    Component Value Date/Time   COLORURINE STRAW (A) 11/21/2020 1230   APPEARANCEUR Clear 06/26/2023 1439   LABSPEC 1.005 11/21/2020 1230   PHURINE 7.0 11/21/2020 1230   GLUCOSEU Negative 06/26/2023 1439   HGBUR NEGATIVE 11/21/2020 1230   HGBUR negative 10/27/2008 0838   BILIRUBINUR Negative 06/26/2023 1439   KETONESUR NEGATIVE 11/21/2020 1230   PROTEINUR Trace 06/26/2023 1439   PROTEINUR NEGATIVE 11/21/2020 1230   UROBILINOGEN 0.2 01/11/2014 1405   NITRITE Negative 06/26/2023 1439   NITRITE NEGATIVE 11/21/2020 1230   LEUKOCYTESUR 2+ (A) 06/26/2023 1439   LEUKOCYTESUR NEGATIVE 11/21/2020 1230    Radiological Exams on Admission: No results found.  EKG: Not yet performed here.  Assessment/Plan Active Problems:   Asthma, mild intermittent, well-controlled   GERD   Anxiety   Hyperlipidemia   Essential hypertension   OSA (obstructive sleep apnea)   Asbestosis (HCC)   ILD (interstitial lung disease) (HCC)   Benign prostatic hyperplasia with urinary obstruction   NSTEMI (non-ST elevated myocardial infarction) (HCC)   NSTEMI > Presenting as a transfer from [facility] for cardiology evaluation due to NSTEMI. > ST depressions on EKG there.  Troponin trend 1800, 2200, 5200, 7500. >  Had been treated for hypertensive emergency which resolved and was weaned off nitro drip there. > Echo done at Outside hospital, did not report regional WMA > Has received Lovenox, metoprolol, aspirin. - Monitoring on progressive unit - Cardiology notified of patient arrival, appreciate their recommendations and assistance - Continue to trend troponin - N.p.o. pending cardiology evaluation - Transition from Lovenox to heparin for prophylaxis, unless cardiology determines patient would benefit from full dose heparin - Continue home losartan, pravastatin - Continue to trend labs - Check a.m. lipid panel - Supportive care  UTI > Urinalysis 12/24 positive UTI.  Today was going to be day 3 of ceftriaxone.  Did receive dose this morning - Continue ceftriaxone   Hyponatremia > Mild hyponatremia at outside facility.  On day of discharge was 132.  Chart review shows he has been on salt tablets. Family reports he continues to take these as he was hospitalized with severe hyponatremia in the past. - Check sodium and trend - Resume salt tabs  Delirium > Did have some intermittent delirium.  Some component of sundowning but also UTI above likely contributing. - Continue Seroquel as he was receiving this at outside hospital - Ceftriaxone as above  Hypertension > Presented to outside hospital hypertensive emergency which have resolved. - Continue amlodipine, spironolactone, losartan, hydralazine at doses he was receiving at outside facility  GERD - Continue home PPI  Anxiety - Seroquel  as above - Will work to confirm if he is truly still taking Ativan   ILD Asthma Asbestosis - Replace home Symbicort formulary Dulera - Continue as needed albuterol  OSA - Not on CPAP  DVT prophylaxis: Heparin Code Status:   Full Family Communication:  Updated at bedside  Disposition Plan:   Patient is from:  [facility]  Anticipated DC to:  Home  Anticipated DC date:  2 to 5 days  Anticipated  DC barriers: None  Consults called:  Cardiology Admission status:  Inpatient, progressive  Severity of Illness: The appropriate patient status for this patient is INPATIENT. Inpatient status is judged to be reasonable and necessary in order to provide the required intensity of service to ensure the patient's safety. The patient's presenting symptoms, physical exam findings, and initial radiographic and laboratory data in the context of their chronic comorbidities is felt to place them at high risk for further clinical deterioration. Furthermore, it is not anticipated that the patient will be medically stable for discharge from the hospital within 2 midnights of admission.   * I certify that at the point of admission it is my clinical judgment that the patient will require inpatient hospital care spanning beyond 2 midnights from the point of admission due to high intensity of service, high risk for further deterioration and high frequency of surveillance required.Synetta Fail MD [facility]  How to contact the [facility] Attending or Consulting provider 7A - 7P or covering provider during after hours 7P -7A, for this patient?   Check the care team in [facility] and look for a) attending/consulting [facility] provider listed and b) the [facility] team listed Log into [email or web address] and use [facility] universal password to access. If you do not have the password, please contact the hospital operator. Locate the [facility] provider you are looking for under [facility] and page to a number that you can be directly reached. If you still have difficulty reaching the provider, please page the [facility] (Director on Call) for the Hospitalists listed on amion for assistance.  12/11/2023, 1:47 PM

## 2023-12-11 NOTE — Consult Note (Addendum)
 Cardiology Consultation   Patient ID: Julian Fowler MRN: [id number]; DOB: 12-30-1934  Admit date: 12/11/2023 Date of Consult: 12/11/2023  PCP:  Juliette Alcide, MD   [facility] Providers Cardiologist:  Nona Dell, MD   {  Patient Profile:   Julian Fowler is a 89 y.o. male with a hx of Permanent AFIB not anticoagulated due to frequent falls(CHADVASC:4), Mild Aortic Stenosis, CAD, HTN, HLD, OSA, cerebral vascular AV fistula, GERD, acoustic neuroma, BPH, asthma, anxiety, ILD, asbestosis,   who is being seen 12/11/2023 for the evaluation of NSTEMI at the request of Dr. Alinda Money.  History of Present Illness:   Julian Fowler is a 61+ y/o male with above medical history. Patient was last seen in cardiology clinic on 11/15/23 for follow up visit.  At this time patient denied any exertional chest pain, palpitations or falls.  EKG showed rate controlled A-fib with IVCD and LVH.  He was asymptomatic terms of palpitations and heart rate was well-controlled in the absence of AV nodal blockers.  Noted that he was previously bradycardic on Toprol XL which was discontinued.  At this visit it was noted that blood pressure was trending up despite current treatment of hydralazine and Cozaar.  Aldactone was considered and but awaiting recent lab work PCP done in December 2024.   Below are some previous studies completed with [facility]:  Renal  US 09/21/2021:   which showed no evidence of renal artery stenosis. Last ECHO 05/20/23: LVEF 55 to 60%.  No RWMA.  Moderate LV hypertrophy.  Mildly elevated PASP 39.7 mmHg.  Severe left atrial dilation.  Mild right atrial dilation.  Mild MVR.  Mild AV stenosis with mean gradient of 10 mmHg.  Mild dilation of ascending aorta measuring 40 mm. Last 12 lead 11/15/23: Afib with controlled HR 71  Per chart review, patient initially presented to [facility] on 12/04/23 for concerns of severely elevated BP accompanied with fogginess, dizziness and  lightheadedness.  He was treated for hypertensive emergency 225/119 with  nitroglycerin infusion.  He was weaned off IV to p.o. medications.  Course was complicated by some agitation and he was found to have positive UTI on UA,  severely hyponatremic,  and negative MRI.  Also he was found to be severely hyponatremic and he is receiving sodium chloride tablets. On 12/10/23, patient developed significant chest pain associated with troponin trending [phone] plus EKG showed evidence of ST depression diffusely.  On 12/11/23, troponin continued to trend up to 7551.  CMP within normal limits except NA 132, CR 0.7.  CBC revealed normocytic anemia with Hgb 12.2, HCT 35.6 and absolute eosinophils elevated to 1.1. XR abdomen 12/05/23: Normal bowel gas pattern.  Cholecystectomy clips.  Lumbar and lower thoracic spine degenerative changes atheromatous arterial calcifications. ECHO 12/05/23 (prior to CP : LVEF > 55%.  Mild to moderately increased LV wall thickness.  Mitral annular calcification is present.  Aortic sclerosis present.  LA moderately dilated.  Right ventricle mildly dilated.  RA mildly dilated. CXR 12/09/23: No evidence of acute cardiopulmonary process.  Extensive partially calcified pleural plaque formation bilaterally consistent with asbestosis related pleural disease.  Patient was transferred from [facility] to [facility] on 12/11/2023 for NSTEMI evaluation.  On examination, patient is alert and oriented with wife and daughter at bedside.  He does have hearing impairment and can hear you better about mouth reading. Patient reports episode of chest pain yesterday located right of sternum described as sharp 7/10 constant x 3 hours.  CP worse with leaning forward and relieved with laying down.  Denies any associated nausea  or diaphoresis.  He reported having similar CP episodes but less severity and timing.  Prior to this hospitalization patient denies any difficulty in daily activity, and denies any  shortness of breath or dizziness.  However, wife and daughter both reported that he was extremely weak 1 week prior to coming to hospital.  Also prior to this hospitalization there was no concern for mental instability or confusion.  Wife reported the reason for confusion and mental instability at [facility] was due to hyponatremia Per patient wife, he does not take blood thinners or anticoagulants due to recent falls and an easy skin tears.  Patient is a previous smoker and drinker stopped in 1988.  He denies any recreational drug use.  He admits to a poor diet including eating out frequently.  He denies any exercise regularly.  Cardiac family history includes mother with A-fib and possible MI who died at 48.    My evaluation, the patient's did not initially present to the ER with chest pain, was more weakness and fatigue however while in the hospital he developed almost a more right-sided chest pain that lasted about 3 hours was found to have troponin elevations that despite at roughly 2300 and then went back down.  EKG had profound lateral ST depressions concerning for ischemia which would argue for non-STEMI.  Unfortunately, the patient was not started on treatment dose of anticoagulation or aspirin. He has not any further episodes of chest pain since he became pain-free. They were transferred to [facility] but no obvious plan determined on transfer..  Past Medical History:  Diagnosis Date   Allergic rhinitis    Aortic stenosis    Arthritis    Atrial fibrillation (HCC)    BPH (benign prostatic hyperplasia)    Coronary artery calcification seen on CT scan    Easy bruisability    ED (erectile dysfunction)    Essential hypertension    GERD (gastroesophageal reflux disease)    H/O hiatal hernia    Hearing loss in left ear    History of asbestosis    History of shingles    Hyperlipidemia    Overactive bladder    PSVT (paroxysmal supraventricular tachycardia) (HCC)    Sleep  apnea    No longer on CPAP following weight loss   Tinnitus     Past Surgical History:  Procedure Laterality Date   BACK SURGERY  2014   herniated L1, L2   Dr Channing Mutters   BIOPSY  09/18/2023   Procedure: BIOPSY;  Surgeon: Corbin Ade, MD;  Location: [facility];  Service: Endoscopy;;   BRAIN SURGERY     CEREBRAL EMBOLIZATION  12/2011   "radiation therapy-did not work"   CHOLECYSTECTOMY  6/98   COLONOSCOPY  09/2009   Dr. Cleotis Nipper: normal, internal hemorrhoids    COLONOSCOPY N/A 02/28/2017   Dr. Jena Gauss: Hemorrhoids, grade 3, mild diverticulosis.   CRANIECTOMY FOR EXCISION OF ACOUSTIC NEUROMA  3/95   ESOPHAGOGASTRODUODENOSCOPY (EGD) WITH PROPOFOL N/A 09/18/2023   Procedure: ESOPHAGOGASTRODUODENOSCOPY (EGD) WITH PROPOFOL;  Surgeon: Corbin Ade, MD;  Location: [facility];  Service: Endoscopy;  Laterality: N/A;  930am, asa 3   MINOR AMPUTATION OF DIGIT Left 12/31/2018   Procedure: REVISION AMPUTATION OF LEFT INDEX FINGER, IRRIGATION AND DEBRIDEMENT LEFT INDEX FINGER;  Surgeon: Ernest Mallick, MD;  Location: [facility] OR;  Service: Orthopedics;  Laterality: Left;   POLYPECTOMY  09/18/2023   Procedure: POLYPECTOMY;  Surgeon: Corbin Ade, MD;  Location: [facility];  Service: Endoscopy;;   RADIOLOGY WITH ANESTHESIA N/A 07/07/2014   Procedure: EMBOLIZATION;  Surgeon: Oneal Grout, MD;  Location: [facility] OR;  Service: Radiology;  Laterality: N/A;   TOTAL KNEE ARTHROPLASTY Left 07/06/2013   Procedure: LEFT TOTAL KNEE ARTHROPLASTY;  Surgeon: Loanne Drilling, MD;  Location: [facility] OR[facility];  Service: Orthopedics;  Laterality: Left;   TOTAL KNEE ARTHROPLASTY Right 01/18/2014   Procedure: RIGHT TOTAL KNEE ARTHROPLASTY;  Surgeon: Loanne Drilling, MD;  Location: [facility] OR[facility];  Service: Orthopedics;  Laterality: Right;   TRIGGER FINGER RELEASE  2003   (thumb) middle finger (2006)     Home Medications:  Prior to Admission medications   Medication Sig Start Date End Date Taking? Authorizing Provider   acetaminophen (TYLENOL) 325 MG tablet Take 325-650 mg by mouth 2 (two) times daily as needed for moderate pain (pain score 4-6), fever or headache.   Yes [provider]  azelastine (ASTELIN) 0.1 % nasal spray 1-2 puffs each nostril every 8 hours if needed for drainage 12/17/17  Yes Young, Joni Fears D, MD  Cholecalciferol (VITAMIN D-3 PO) Take 1 tablet by mouth daily with lunch.   Yes [provider]  esomeprazole (NEXIUM) 40 MG capsule Take 1 capsule (40 mg total) by mouth 2 (two) times daily before a meal. 11/19/23  Yes Tiffany Kocher, PA-C  hydrALAZINE (APRESOLINE) 50 MG tablet TAKE 1 & 1/2 (ONE & ONE-HALF) TABLETS BY MOUTH THREE TIMES DAILY 06/24/23  Yes Jonelle Sidle, MD  latanoprost (XALATAN) 0.005 % ophthalmic solution Place 1 drop into both eyes at bedtime.  09/01/12  Yes [provider]  LORazepam (ATIVAN) 1 MG tablet TAKE 1 TABLET BY MOUTH THREE TIMES DAILY AS NEEDED FOR ANXIETY Patient taking differently: Take 1 mg by mouth See admin instructions. Take 1 tablet (1mg ) by mouth nightly at bedtime for sleep. May take an additional 1 tablet twice daily as needed for anxiety. 06/24/23  Yes Young, Joni Fears D, MD  losartan (COZAAR) 100 MG tablet Take 100 mg by mouth daily.   Yes [provider]  memantine (NAMENDA) 5 MG tablet Take 5 mg by mouth 2 (two) times daily. 10/26/23  Yes [provider]  Multiple Vitamins-Minerals (CENTRUM SILVER 50+MEN) TABS Take 1 tablet by mouth daily.   Yes [provider]  Multiple Vitamins-Minerals (ZINC PO) Take 1 tablet by mouth daily with lunch.   Yes [provider]  nitrofurantoin (MACRODANTIN) 50 MG capsule Take 50 mg by mouth at bedtime.   Yes [provider]  Polyethyl Glycol-Propyl Glycol (SYSTANE OP) Place 1 drop into both eyes 2 (two) times daily as needed (dry eyes).   Yes [provider]  polyethylene glycol (MIRALAX / GLYCOLAX) packet Take 17 g by mouth at bedtime.   Yes  [provider]  potassium chloride SA (KLOR-CON M) 20 MEQ tablet Take 1 tablet (20 mEq total) by mouth daily. 05/08/23  Yes Jonelle Sidle, MD  pravastatin (PRAVACHOL) 40 MG tablet Take 1 tablet by mouth at bedtime.  10/05/14  Yes [provider]  PROAIR HFA 108 (90 Base) MCG/ACT inhaler INHALE 2 PUFFS BY MOUTH EVERY 6 HOURS AS NEEDED FOR WHEEZING FOR SHORTNESS OF BREATH 03/18/20  Yes Young, Clinton D, MD  silodosin (RAPAFLO) 8 MG CAPS capsule Take 1 capsule (8 mg total) by mouth at bedtime. 06/26/23  Yes McKenzie, Mardene Celeste, MD  sodium chloride 1  g tablet Take 1 g by mouth 3 (three) times daily. 02/08/21  Yes [provider]  spironolactone (ALDACTONE) 25 MG tablet Take 0.5 tablets (12.5 mg total) by mouth daily. 11/21/23  Yes Jonelle Sidle, MD  SYMBICORT 160-4.5 MCG/ACT inhaler INHALE 2 PUFFS BY MOUTH  TWICE DAILY THEN  RINSE  MOUTH Patient taking differently: Inhale 2 puffs into the lungs 2 (two) times daily as needed (shortness of breath, wheezing). INHALE 2 PUFFS BY MOUTH  TWICE DAILY THEN  RINSE  MOUTH 01/31/21  Yes Young, Clinton D, MD  Wheat Dextrin (BENEFIBER PO) Take 1 Scoop by mouth 2 (two) times daily.   Yes [provider]  zolpidem (AMBIEN) 10 MG tablet Take 10 mg by mouth at bedtime. 01/24/16  Yes [provider]    Inpatient Medications: Scheduled Meds:  [START ON 12/12/2023] amLODipine  10 mg Oral Daily   hydrALAZINE  100 mg Oral TID   [START ON 12/12/2023] losartan  100 mg Oral q morning   mometasone-formoterol  2 puff Inhalation BID   [START ON 12/12/2023] pantoprazole  40 mg Oral Daily   pravastatin  40 mg Oral QHS   QUEtiapine  50 mg Oral QHS   sodium chloride  1 g Oral TID WC   [START ON 12/12/2023] spironolactone  25 mg Oral Daily   Continuous Infusions:  [START ON 12/12/2023] cefTRIAXone (ROCEPHIN)  IV     heparin 1,100 Units/hr (12/11/23 1704)   PRN Meds: acetaminophen, albuterol, nitroGLYCERIN, ondansetron (ZOFRAN)  IV  Allergies:    Allergies  Allergen Reactions   Latex Itching   Bactrim [Sulfamethoxazole-Trimethoprim] Other (See Comments)    Unknown reaction   Prednisone Other (See Comments)    Pt is high functioning and out of sorts.   Codeine Other (See Comments)    Grogginess/drowsiness    Tape Other (See Comments)    Skin tears, use paper tape    Social History:   Social History   Socioeconomic History   Marital status: Married    Spouse name: Not on file   Number of children: Not on file   Years of education: Not on file   Highest education level: Not on file  Occupational History   Not on file  Tobacco Use   Smoking status: Former    Current packs/day: 0.00    Average packs/day: 1.5 packs/day for 30.0 years (45.0 ttl pk-yrs)    Types: Cigarettes    Start date: 04/24/1957    Quit date: 10/15/1986    Years since quitting: 37.1    Passive exposure: Never   Smokeless tobacco: Never  Vaping Use   Vaping status: Never Used  Substance and Sexual Activity   Alcohol use: No    Alcohol/week: 0.0 standard drinks of alcohol    Comment: Used to drink heavily at times   Drug use: No   Sexual activity: Not Currently  Other Topics Concern   Not on file  Social History Narrative   Co-dependent relationship with his 63 year old son who has drug and financial problems   Recently married to [place] on 01/02/2014   Social Drivers of Health   Financial Resource Strain: Low Risk  (12/05/2023)   Received from [facility]   Overall Financial Resource Strain (CARDIA)    Difficulty of Paying Living Expenses: Not hard at all  Food Insecurity: No Food Insecurity (12/11/2023)   Hunger Vital Sign    Worried About Running Out of Food in the Last Year: Never  true    Ran Out of Food in the Last Year: Never true  Transportation Needs: No Transportation Needs (12/11/2023)   PRAPARE - Administrator, Civil Service (Medical): No    Lack of Transportation (Non-Medical): No  Physical  Activity: Inactive (12/05/2023)   Received from [facility]   Exercise Vital Sign    Days of Exercise per Week: 0 days    Minutes of Exercise per Session: 10 min  Stress: No Stress Concern Present (12/05/2023)   Received from [facility] - Occupational Stress Questionnaire    Feeling of Stress : Only a little  Social Connections: Socially Integrated (12/11/2023)   Social Connection and Isolation Panel [NHANES]    Frequency of Communication with Friends and Family: More than three times a week    Frequency of Social Gatherings with Friends and Family: More than three times a week    Attends Religious Services: More than 4 times per year    Active Member of [employer] or Organizations: Yes    Attends Engineer, structural: More than 4 times per year    Marital Status: Married  Catering manager Violence: Not At Risk (12/11/2023)   Humiliation, Afraid, Rape, and Kick questionnaire    Fear of Current or Ex-Partner: No    Emotionally Abused: No    Physically Abused: No    Sexually Abused: No    Family History:   Family History  Problem Relation Age of Onset   Cancer Father        oral cancer   Breast cancer Mother    Heart attack Mother    Hypertension Sister        Bypass x4   Alcohol abuse Sister    Alzheimer's disease Sister    Kidney disease Sister    Colon cancer Neg Hx      ROS:  Please see the history of present illness.  All other ROS reviewed and negative.     Physical Exam/Data:   Vitals:   12/11/23 1254 12/11/23 1312 12/11/23 1618 12/11/23 1654  BP: (!) 152/84 (!) 152/84 (!) 160/79 (!) 156/96  Pulse: 92 92 79   Resp:  18 18   Temp: 97.9 F (36.6 C) 97.9 F (36.6 C) 98.2 F (36.8 C)   TempSrc: Oral Oral Oral   SpO2: 98%     Weight: 108 kg 108 kg    Height: 5\' 9"  (1.753 m) 5\' 9"  (1.753 m)     No intake or output data in the 24 hours ending 12/11/23 1708    12/11/2023    1:12 PM 12/11/2023   12:54 PM  11/19/2023    1:59 PM  Last 3 Weights  Weight (lbs) 238 lb 238 lb 240 lb 3.2 oz  Weight (kg) 107.956 kg 107.956 kg 108.954 kg     Body mass index is 35.15 kg/m.  General:  Sitting upright in with wife an daughter at bedside, in no acute distress; alert and orient x 3.  Well-groomed. HEENT: normal Neck: no JVD Vascular: No carotid bruits; Distal pulses 2+ bilaterally Cardiac:  normal S1, S2; RRR; no murmur, radial pulse 2+  Lungs:  clear to auscultation bilaterally, no wheezing, rhonchi or rales  Abd: soft, nontender, no hepatomegaly  Ext: LE bilateral edema, +2 pitting edema  Musculoskeletal:  No deformities, BUE and BLE strength normal and equal Skin: warm and dry -> diffuse areas of ecchymosis and skin tears. Neuro:  CNs 2-12 intact, no focal abnormalities noted Psych:  Normal affect   Personal review the EKG from [facility] following chest pain revealed A-fib with borderline bundle-branch block/IVCD and dramatic 2 to 3 mm ST depressions in lateral leads concerning for ischemia.  Not present on EKG here.  EKG:  The EKG was personally reviewed and demonstrates:  Afib with controlled HR 71.  There is LVH with QRS widening and minimal ST depressions in V4 V5 and V6-dramatically reduced from EKG going to Nina. Telemetry:  Telemetry was personally reviewed and demonstrates:  Afib controlled, HR 70-80's with occasional PVC     Relevant CV Studies: None this admission yet.   Laboratory Data:  High Sensitivity Troponin:   Recent Labs  Lab 12/11/23 1403  TROPONINIHS 7,237*     Chemistry Recent Labs  Lab 12/11/23 1403  NA 130*  K 4.2  CL 95*  CO2 23  GLUCOSE 116*  BUN 10  CREATININE 0.79  CALCIUM 8.5*  GFRNONAA >60  ANIONGAP 12    Recent Labs  Lab 12/11/23 1403  PROT 5.9*  ALBUMIN 2.5*  AST 100*  ALT 47*  ALKPHOS 72  BILITOT 0.4   Lipids No results for input(s): "CHOL", "TRIG", "HDL", "LABVLDL", "LDLCALC", "CHOLHDL" in the last 168 hours.   Hematology Recent Labs  Lab 12/11/23 1403  WBC 8.1  RBC 4.04*  HGB 12.2*  HCT 35.7*  MCV 88.4  MCH 30.2  MCHC 34.2  RDW 13.8  PLT 398   Thyroid No results for input(s): "TSH", "FREET4" in the last 168 hours.  BNPNo results for input(s): "BNP", "PROBNP" in the last 168 hours.  DDimer No results for input(s): "DDIMER" in the last 168 hours.   Radiology/Studies:  No results found.   Assessment and Plan:  Principal Problem:   NSTEMI (non-ST elevated myocardial infarction) (HCC) Active Problems:   Permanent atrial fibrillation (HCC)   Hyperlipidemia   Essential hypertension   Asthma, mild intermittent, well-controlled   GERD   Anxiety   OSA (obstructive sleep apnea)   Asbestosis (HCC)   ILD (interstitial lung disease) (HCC)   Benign prostatic hyperplasia with urinary obstruction  STEMI  - Per chart review, patiently initially presented to [facility] on 12/04/2023 for severely elevated BP associated with fogginess dizziness and lightheadedness.  His workup revealed hypertensive emergency which was treated with nitroglycerin infusion and subsequently weaned to p.o. medication.  Also course was complicated by agitation requiring Haldol administration.  This was suspected to relate to sundowning however MRI was negative - ECHO 12/05/23: LVEF > 55%.  Mild to moderately increased LV wall thickness.  Mitral annular calcification is present.  Aortic sclerosis present.  LA moderately dilated.  Right ventricle mildly dilated.  RA mildly dilated. - While admitted at [facility] on 12/10/2023, patient developed significant chest pain associated with positive troponin trending 1819 > 2283 > 7551.  Also EKG showed ST depression diffusely. - Patient transferred from [facility] for cardiology evaluation for NSTEMI on 12/11/2023 - PTA meds: Losartan, pravastatin - Upon arrival, was on Lovenox, but apparently only the prophylaxis dosing.Marland Kitchen  Pharmacology was consulted.  Will start heparin  IV for ACS.  - Currently on pravastatin 40 mg.  Will continue.  - D/C Losartan, switching to more potent ARB, irbesartan 75mg .  - Per patient's wife, not on blood thinners or anticoagulants due to fall history and skin tears.  - Ruled in for non-STEMI, however he is a frail 89 year old with history of frequent falls  and  intermittent confusion therefore will discuss proceeding with cardiac catheterization or medical treatment.  Hyponatremia  - Hyponatremia at [facility]. Currently being treated with sodium replacement. Will continue.  -  most likely contributing factor to intermittent delirium which has improved significant to baseline.  UTI -Positive UA on 12/24 at [facility].  Continue treatment with ceftriaxone IV - most likely contributing factor to intermittent delirium which has improved significant to baseline.  HTN - He was initially treated for hypertensive emergency with blood pressure of 225/119 at [facility].  He was given IV nitroglycerin and weaned to p.o. - Currently on hydralazine 100 mg TID  - BP not controlled at 172/89 during exam.  - Scheduled tomorrow for amlodipine 10 mg, losartan 100 mg and spironolactone 25 mg -  D/C Losartan, switching to more potent ARB, irbesartan 75mg .  -  Decreasing Amlodipine to 5 mg with edema present.   Afib  - managed without medications - currently controlled with HR of 70-80's - previous tried BB but d/c due to bradycardia.  - Per patient's wife, not on blood thinners or anticoagulants due to fall history and skin tears.    Managed by primary team  - Edema  - GERD  - ILD - Asthma  - OSA  Risk Assessment/Risk Scores:   TIMI Risk Score for Unstable Angina or Non-ST Elevation MI:   The patient's TIMI risk score is 4, which indicates a 20% risk of all cause mortality, new or recurrent myocardial infarction or need for urgent revascularization in the next 14 days.  New York Heart Association (NYHA) Functional  Class NYHA Class II  CHA2DS2-VASc Score = 5  This indicates a 7.2% annual risk of stroke. The patient's score is based upon: CHF History: 1 HTN History: 1 Diabetes History: 0 Stroke History: 0 Vascular Disease History: 1 Age Score: 2 Gender Score: 0      For questions or updates, please contact [facility] Please consult [email or web address] for contact info under    Signed, Basilio Cairo, PA-C  12/11/2023 5:08 PM   ATTENDING ATTESTATION  I have seen, examined and evaluated the patient this evening along with Ms. Luan Pulling, PA-C.  After reviewing all the available data and chart, we discussed the patients laboratory, study & physical findings as well as symptoms in detail.  I agree with her findings, examination as well as impression recommendations as per our discussion.    Attending adjustments noted in italics.   89 year old gentleman with no known history of CAD and only cardiac history of A-fib-who is not on DOAC because of concerns of easy bruising, bleeding and falls.  He presented to [facility] with what appeared to be accelerated hypertension and subsequently developed chest pain and pressure with significant ST depressions on EKG followed by troponin elevation consistent with non-STEMI.  Pain lasted about 3 hours.  Transferred to [facility] for ongoing care.  Currently asymptomatic.  Not on heparin or treatment dose Lovenox.  We will initiate IV heparin along with aspirin and adjust medications as noted above for blood pressure control.  I spent 45 minutes with the patient and family discussing the pathophysiology of non-STEMI and potential treatment options going forward.  I discussed early conservative management with IV heparin aspirin statin and monitor for symptoms.  If no symptoms could simply discharge after medical therapy.  Another option would be similar to option a but with the addition of checking 2D echocardiogram which would be to help  potentially direct this  more toward an invasive strategy versus noninvasive strategy, and the option C would be going directly to cardiac catheterization.  The issue with going to the cardiac catheterization also then brings up the the the risk benefits alternatives and indications that were discussed, but also the downstream effect of the potential need for use of DAPT for stent placement and the patient who has significant nuisance bleeding issues already and is not taking a DOAC because of it.  He is not excited about the idea of being on a medicine that would make him more likely to bleed.  I explained to him that the there are 3 good options all are acceptable.  He plans to discuss this with his partner and daughter and other family members and we will come up with a consensus decision in the morning to be discussed with APP on initial rounds. I will tentatively place him on the cath board for add on cath tomorrow and will also order limited echo to reassess for any regional wall motion abnormality or drop in EF.  If he chooses to proceed with cardiac catheterization, we can likely cancel the echocardiogram, however the echocardiogram could potentially direct this either to medical management versus catheterization.  Once a decision about whether or not we go to catheterization is me, we would then need to determine his desires work we to find a lesion amenable to PCI and the concerns about the Plavix.  I explained the difference tween simply doing a diagnostic catheterization versus the interventions.  We also discussed the possibility of seeing significant multivessel disease, heavily calcified lesions or other high risk features that would not be amenable to PCI in which case we would simply recommend medical management going forward.  The patient and his family asked many questions that were answered and were satisfied with the explanations and helped to talk together as a group and, but a good game  plan.  We will determine the formal plan tomorrow after discussion.  -- IV heparin, aspirin, statin -- Check limited echo (cancel if the decision to go to cath) -- Post for cardiac catheterization pending discussion in the morning.   Informed Consent   Shared Decision Making/Informed Consent The risks [stroke (1 in 1000), death (1 in 1000), kidney failure [usually temporary] (1 in 500), bleeding (1 in 200), allergic reaction [possibly serious] (1 in 200)], benefits (diagnostic support and management of coronary artery disease) and alternatives of a cardiac catheterization were discussed in detail with Julian Fowler and he is willing to proceed.      I spent 62 minutes in the care of Julian Fowler today including reviewing labs (1 minutes), reviewing outside labs from [facility] (3 minutes), reviewing outside studies (From [facility]-8 minutes), face to face time discussing treatment options (45 minutes), reviewing records from [facility] (5 minutes), 5 minutes dictating, and documenting in the encounter.     Marykay Lex, MD, MS Bryan Lemma, M.D., M.S. Interventional Cardiologist  [facility]  Pager # [phone] Phone # [phone] [address] [place], [place] [address]

## 2023-12-11 NOTE — Progress Notes (Signed)
 PHARMACY - ANTICOAGULATION CONSULT NOTE  Pharmacy Consult for heparin Indication: chest pain/ACS  Allergies  Allergen Reactions   Latex Itching   Other     Other Reaction(s): Other  Skin tears, use paper tape   Bactrim [Sulfamethoxazole-Trimethoprim]    Prednisone     Pt is high functioning and out of sorts.   Codeine Other (See Comments)    REACTION: groggy   Tape Other (See Comments)    Skin tears, use paper tape    Patient Measurements: Height: 5\' 9"  (175.3 cm) Weight: 108 kg (238 lb) IBW/kg (Calculated) : 70.7 Heparin Dosing Weight: 94kg  Vital Signs: Temp: 97.9 F (36.6 C) (02/26 1312) Temp Source: Oral (02/26 1312) BP: 152/84 (02/26 1312) Pulse Rate: 92 (02/26 1312)  Labs: Recent Labs    12/11/23 1403  HGB 12.2*  HCT 35.7*  PLT 398    CrCl cannot be calculated (Patient's most recent lab result is older than the maximum 21 days allowed.).   Medical History: Past Medical History:  Diagnosis Date   Allergic rhinitis    Aortic stenosis    Arthritis    Atrial fibrillation (HCC)    BPH (benign prostatic hyperplasia)    Coronary artery calcification seen on CT scan    Easy bruisability    ED (erectile dysfunction)    Essential hypertension    GERD (gastroesophageal reflux disease)    H/O hiatal hernia    Hearing loss in left ear    History of asbestosis    History of shingles    Hyperlipidemia    Overactive bladder    PSVT (paroxysmal supraventricular tachycardia) (HCC)    Sleep apnea    No longer on CPAP following weight loss   Tinnitus      Assessment: 65 yoM with hx AF and CAD not anticoagulated 2/2 frequent falls admitted as transfer for ACS evaluation from OSH. Pt received therapeutic enoxaparin 2/26 ~0900, pharmacy to transition to IV heparin. Cr <1 at OSH and CBC ok on admit here.  Goal of Therapy:  Heparin level 0.3-0.7 units/ml Monitor platelets by anticoagulation protocol: Yes   Plan:  Heparin 1100 units/h no bolus at  1700 Check heparin level 8h after starting  Fredonia Highland, PharmD, BCPS, [facility] Clinical Pharmacist [phone] Please check AMION for all [facility] Pharmacy numbers 12/11/2023

## 2023-12-11 NOTE — Plan of Care (Signed)
  Problem: Education: Goal: Knowledge of General Education information will improve Description: Including pain rating scale, medication(s)/side effects and non-pharmacologic comfort measures Outcome: Progressing   Problem: Clinical Measurements: Goal: Will remain free from infection Outcome: Progressing Goal: Cardiovascular complication will be avoided Outcome: Progressing   Problem: Nutrition: Goal: Adequate nutrition will be maintained Outcome: Progressing   Problem: Coping: Goal: Level of anxiety will decrease Outcome: Progressing   Problem: Elimination: Goal: Will not experience complications related to bowel motility Outcome: Progressing Goal: Will not experience complications related to urinary retention Outcome: Progressing   Problem: Pain Managment: Goal: General experience of comfort will improve and/or be controlled Outcome: Progressing   Problem: Safety: Goal: Ability to remain free from injury will improve Outcome: Progressing   Problem: Skin Integrity: Goal: Risk for impaired skin integrity will decrease Outcome: Progressing   Problem: Activity: Goal: Ability to tolerate increased activity will improve Outcome: Progressing

## 2023-12-12 ENCOUNTER — Inpatient Hospital Stay (HOSPITAL_COMMUNITY): Payer: Medicare Other

## 2023-12-12 ENCOUNTER — Encounter (HOSPITAL_COMMUNITY): Payer: Self-pay | Admitting: Internal Medicine

## 2023-12-12 ENCOUNTER — Encounter (HOSPITAL_COMMUNITY)
Admission: EM | Disposition: A | Discharge status: Rehabilitation Facility | Payer: Self-pay | Source: Other Acute Inpatient Hospital | Attending: Internal Medicine

## 2023-12-12 DIAGNOSIS — E782 Mixed hyperlipidemia: Secondary | ICD-10-CM | POA: Diagnosis not present

## 2023-12-12 DIAGNOSIS — N390 Urinary tract infection, site not specified: Secondary | ICD-10-CM | POA: Diagnosis present

## 2023-12-12 DIAGNOSIS — I10 Essential (primary) hypertension: Secondary | ICD-10-CM | POA: Diagnosis not present

## 2023-12-12 DIAGNOSIS — J849 Interstitial pulmonary disease, unspecified: Secondary | ICD-10-CM | POA: Diagnosis not present

## 2023-12-12 DIAGNOSIS — I4821 Permanent atrial fibrillation: Secondary | ICD-10-CM | POA: Diagnosis not present

## 2023-12-12 DIAGNOSIS — I214 Non-ST elevation (NSTEMI) myocardial infarction: Secondary | ICD-10-CM

## 2023-12-12 DIAGNOSIS — F419 Anxiety disorder, unspecified: Secondary | ICD-10-CM | POA: Diagnosis not present

## 2023-12-12 LAB — LIPID PANEL
Cholesterol: 116 mg/dL (ref 0–200)
HDL: 41 mg/dL (ref >40)
LDL Cholesterol: 67 mg/dL (ref 0–99)
Total CHOL/HDL Ratio: 2.8 {ratio}
Triglycerides: 40 mg/dL (ref <150)
VLDL: 8 mg/dL (ref 0–40)

## 2023-12-12 LAB — ECHOCARDIOGRAM LIMITED
AR max vel: 1.74 cm2
AV Area VTI: 1.51 cm2
AV Area mean vel: 1.76 cm2
AV Mean grad: 12 mm[Hg]
AV Peak grad: 23 mm[Hg]
Ao pk vel: 2.4 m/s
Calc EF: 50.6 %
Height: 69 in
S' Lateral: 4.1 cm
Single Plane A2C EF: 58.6 %
Single Plane A4C EF: 40.2 %
Weight: 3884.8 [oz_av]

## 2023-12-12 LAB — CBC
HCT: 34.5 % — ABNORMAL LOW (ref 39.0–52.0)
Hemoglobin: 11.9 g/dL — ABNORMAL LOW (ref 13.0–17.0)
MCH: 30.2 pg (ref 26.0–34.0)
MCHC: 34.5 g/dL (ref 30.0–36.0)
MCV: 87.6 fL (ref 80.0–100.0)
Platelets: 401 10*3/uL — ABNORMAL HIGH (ref 150–400)
RBC: 3.94 MIL/uL — ABNORMAL LOW (ref 4.22–5.81)
RDW: 13.8 % (ref 11.5–15.5)
WBC: 8.2 10*3/uL (ref 4.0–10.5)
nRBC: 0 % (ref 0.0–0.2)

## 2023-12-12 LAB — BASIC METABOLIC PANEL
Anion gap: 11 (ref 5–15)
BUN: 9 mg/dL (ref 8–23)
CO2: 22 mmol/L (ref 22–32)
Calcium: 8.3 mg/dL — ABNORMAL LOW (ref 8.9–10.3)
Chloride: 96 mmol/L — ABNORMAL LOW (ref 98–111)
Creatinine, Ser: 0.73 mg/dL (ref 0.61–1.24)
GFR, Estimated: >60 mL/min (ref >60)
Glucose, Bld: 97 mg/dL (ref 70–99)
Potassium: 3.9 mmol/L (ref 3.5–5.1)
Sodium: 129 mmol/L — ABNORMAL LOW (ref 135–145)

## 2023-12-12 LAB — TROPONIN I (HIGH SENSITIVITY): Troponin I (High Sensitivity): 4899 ng/L (ref <18)

## 2023-12-12 LAB — HEPARIN LEVEL (UNFRACTIONATED): Heparin Unfractionated: 0.8 [IU]/mL — ABNORMAL HIGH (ref 0.30–0.70)

## 2023-12-12 SURGERY — LEFT HEART CATH AND CORONARY ANGIOGRAPHY
Anesthesia: LOCAL

## 2023-12-12 MED ORDER — AMLODIPINE BESYLATE 10 MG PO TABS
10.0000 mg | ORAL_TABLET | Freq: Every day | ORAL | Status: DC
  Administered 2023-12-13 – 2023-12-19 (×7): 10 mg via ORAL
  Filled 2023-12-12 (×7): qty 1

## 2023-12-12 MED ORDER — PERFLUTREN LIPID MICROSPHERE
1.0000 mL | INTRAVENOUS | Status: AC | PRN
Start: 2023-12-12 — End: 2023-12-12
  Administered 2023-12-12: 2 mL via INTRAVENOUS

## 2023-12-12 MED ORDER — SODIUM CHLORIDE 0.9 % IV SOLN: INTRAVENOUS | Status: DC

## 2023-12-12 MED ORDER — ENOXAPARIN SODIUM 100 MG/ML IJ SOSY
100.0000 mg | PREFILLED_SYRINGE | Freq: Once | INTRAMUSCULAR | Status: AC
  Administered 2023-12-12: 100 mg via SUBCUTANEOUS
  Filled 2023-12-12: qty 1

## 2023-12-12 MED ORDER — ISOSORBIDE MONONITRATE ER 30 MG PO TB24
30.0000 mg | ORAL_TABLET | Freq: Every day | ORAL | Status: DC
  Administered 2023-12-12 – 2023-12-13 (×2): 30 mg via ORAL
  Filled 2023-12-12 (×2): qty 1

## 2023-12-12 MED ORDER — ASPIRIN 81 MG PO CHEW
81.0000 mg | CHEWABLE_TABLET | ORAL | Status: DC
  Filled 2023-12-12: qty 1

## 2023-12-12 MED ORDER — ASPIRIN 81 MG PO TBEC
81.0000 mg | DELAYED_RELEASE_TABLET | Freq: Every day | ORAL | Status: DC
  Administered 2023-12-12 – 2023-12-13 (×2): 81 mg via ORAL
  Filled 2023-12-12 (×2): qty 1

## 2023-12-12 MED ORDER — AMLODIPINE BESYLATE 5 MG PO TABS
5.0000 mg | ORAL_TABLET | Freq: Once | ORAL | Status: AC
Start: 2023-12-12 — End: 2023-12-12
  Administered 2023-12-12: 5 mg via ORAL
  Filled 2023-12-12: qty 2

## 2023-12-12 MED ORDER — SODIUM CHLORIDE 0.9 % IV SOLN: INTRAVENOUS | Status: AC

## 2023-12-12 NOTE — Plan of Care (Signed)
   Problem: Education: Goal: Knowledge of General Education information will improve Description Including pain rating scale, medication(s)/side effects and non-pharmacologic comfort measures Outcome: Progressing

## 2023-12-12 NOTE — Progress Notes (Signed)
 PHARMACY - ANTICOAGULATION CONSULT NOTE  Pharmacy Consult for heparin Indication: chest pain/ACS  Patient Measurements: Height: 5\' 9"  (175.3 cm) Weight: 110.1 kg (242 lb 12.8 oz) IBW/kg (Calculated) : 70.7 Heparin Dosing Weight: 94kg  Vital Signs: Temp: 97.9 F (36.6 C) (02/26 2007) Temp Source: Oral (02/26 2007) BP: 168/89 (02/27 0306) Pulse Rate: 102 (02/26 2007)  Labs: Recent Labs    12/11/23 1403 12/12/23 0447  HGB 12.2* 11.9*  HCT 35.7* 34.5*  PLT 398 401*  HEPARINUNFRC  --  0.80*  CREATININE 0.79  --   TROPONINIHS 7,237*  --     Estimated Creatinine Clearance: 81.1 mL/min (by C-G formula based on SCr of 0.79 mg/dL).   Assessment: 50 yoM with hx AF and CAD not anticoagulated 2/2 frequent falls admitted as transfer for ACS evaluation from OSH. Pt received therapeutic enoxaparin 2/26 ~0900, pharmacy to transition to IV heparin. Cr <1 at OSH and CBC ok on admit here.  AM: heparin level returned at 0.8 on 1100 units/hr (supra-therapeutic). Per RN, no issues with the heparin infusion running continuously or signs/symptoms of bleeding and level drawn appropriately.   Goal of Therapy:  Heparin level 0.3-0.7 units/ml Monitor platelets by anticoagulation protocol: Yes   Plan:  Decrease heparin to 1000 units/h  Check heparin level 8h  CBC daily   Arabella Merles, PharmD. Clinical Pharmacist 12/12/2023 5:25 AM

## 2023-12-12 NOTE — Progress Notes (Addendum)
 Patient Name: Julian Fowler Date of Encounter: 12/12/2023 [facility] Cardiologist: Nona Dell, MD   Interval Summary  .    Agitated this morning, says he does not want to be touched. Refused exam, daughter at the bedside.   Vital Signs .    Vitals:   12/11/23 2112 12/12/23 0306 12/12/23 0851 12/12/23 1116  BP: (!) 151/79 (!) 168/89 (!) 160/90 (!) 155/76  Pulse:   91   Resp:  19 18   Temp:   98.3 F (36.8 C)   TempSrc:   Oral   SpO2:  97%    Weight:  110.1 kg    Height:        Intake/Output Summary (Last 24 hours) at 12/12/2023 1300 Last data filed at 12/12/2023 0538 Gross per 24 hour  Intake 138.1 ml  Output 700 ml  Net -561.9 ml      12/12/2023    3:06 AM 12/11/2023    1:12 PM 12/11/2023   12:54 PM  Last 3 Weights  Weight (lbs) 242 lb 12.8 oz 238 lb 238 lb  Weight (kg) 110.133 kg 107.956 kg 107.956 kg      Telemetry/ECG    Atrial fibrillation, generally rate controlled  - Personally Reviewed  Physical Exam .    GEN: No acute distress. Confused. Refused exam  Assessment & Plan .     89 y.o. male with a hx of Permanent AFIB not anticoagulated due to frequent falls(CHADVASC:4), Mild Aortic Stenosis, CAD, HTN, HLD, OSA, cerebral vascular AV fistula, GERD, acoustic neuroma, BPH, asthma, anxiety, ILD, asbestosis,  who was seen 12/11/2023 for the evaluation of NSTEMI at the request of Dr. Alinda Money.   NSTEMI -- initially admitted on 2/19 to [facility] with weakness and fatigue. Found to have hypertensive urgency, treated with IV NTG.  -- ECHO 12/05/23 at [facility]: LVEF > 55%.  Mild to moderately increased LV wall thickness.  Mitral annular calcification is present.  Aortic sclerosis present.  LA moderately dilated.  Right ventricle mildly dilated.  RA mildly dilated.  -- developed chest pain 2/25 with elevated troponins, peaking at 7551. Repeat troponin on admission to [facility] 7237 -- on lovenox on arrival, transitioned to IV heparin (on review -- was Treatment dose  enoxaparin) == recommend 48 hr total therapy (so we may be close to completion based upon when the initial Enoxaparin dose was given) -- echo pending, though not sure that he will allow staff to complete study as he is refusing physical exam this morning -- at this time, he is not a candidate for invasive evaluation given his level of confusion and agitation -- continue IV heparin, likely 48 hrs total, add ASA 81mg  daily, statin (could consider treatment dose enoxaparin over heparin if thought to be easier to manage).   Hyponatremia  -- Na+ 119 while at [facility], may have been contributing to his initial confusion -- Na+ 130>>129 -- management per primary   UTI -- Positive UA on 12/24 at [facility].  -- antibiotic per primary   HTN -- He was initially treated for hypertensive emergency with blood pressure of 225/119 at [facility].  He was given IV nitroglycerin  -- now on norvasc 5mg  daily, hydralazine 100mg  TID, avapro 75mg  daily, spiro 25mg  daily =>    Permanent Afib  -- remains rate controlled -- previous tried BB but d/c due to bradycardia.  -- Per patient's wife, not on blood thinners or anticoagulants due to fall history and skin tears.  Managed by primary team  Edema  GERD  ILD Asthma  OSA   For questions or updates, please contact [facility] Please consult [email or web address] for contact info under     Signed, Bryan Lemma, MD   ATTENDING ATTESTATION  I have seen, examined and evaluated the patient this AM on rounds  along with Laverda Page, NP-C.  After reviewing all the available data and chart, we discussed the patients laboratory, study & physical findings as well as symptoms in detail.  I agree with her findings, examination as well as impression recommendations as per our discussion.    Attending adjustments noted in italics.   Echocardiogram was being completed when I came into the room.  On preliminary review I do not see a significant  regional wall motion abnormality other than maybe some mild hypokinesis in the inferior septal wall posterior wall. Based on our long conversation yesterday evening and then discussion today, I think the plan would be to continue with medical therapy to complete 72 hours of clinic regulation which we day to now.  I think he should be fine to take a shower tomorrow this was 1 major requesting it..  We will continue to titrate his antihypertensives.  Will add long-acting nitrate, and likely titrate amlodipine to 10 mg. Lipids are relatively well-controlled on current dose of pravastatin, therefore we will continue current dose.  Will await the formal read of echocardiogram, but unless there is a significant wall motion normality I think that thought from the family would be to avoid invasive procedures.  1 major concern to have is the potential of needing to be on long-term aspirin and Plavix. I do think that he needs to be on a minimum of aspirin, and would strongly consider at least a month of Plavix once he is no longer on aspirin.  Could even start without a load.  He was not even happy about being on IV blood thinner.  Will continue to follow.     Marykay Lex, MD, MS Bryan Lemma, M.D., M.S. Interventional Cardiologist  [facility]  Pager # [phone] Phone # [phone] [address] [place], [place] [address]

## 2023-12-12 NOTE — Progress Notes (Signed)
 [facility]                                                                              Pheonix Clinkscale, is a 89 y.o. male, DOB - [birth year], WJX:[id number] Admit date - 12/11/2023    Outpatient Primary MD for the Julian Fowler is [name], [name], MD  LOS - 1  days  No chief complaint on file.      Brief summary   Julian Fowler is a 89 year old male with HTN, hyperlipidemia, GERD, A-fib, cerebrovascular AV fistula, acoustic neuroma, BPH, asthma anxiety, ILD, asbestosis, OSA presented from [facility] with NSTEMI.    Julian Fowler initially presented dated on 2/19 with hypertensive emergency, dizziness, fogginess.  He was treated with nitroglycerin infusion and weaned off to p.o. medications.    He was also found to have UTI, placed on IV Rocephin, developed delirium and agitation secondary to sundowning.  He had a negative MRI. On 2/25, Julian Fowler had a significant crushing chest painEKG showed evidence of ST depression diffusely and elevated troponin to 1819.  He was treated with aspirin, nitroglycerin, Lovenox and metoprolol.  He was transferred to [facility] for NSTEMI evaluation, management and cardiac cath.  Cardiology was consulted.   Troponin noted to be continuing to trend up, here 7237.   Assessment & Plan    Principal Problem:   NSTEMI (non-ST elevated myocardial infarction) (HCC) -Currently no chest pain.  Initially admitted at [facility] with hypertensive emergency, treated with IV nitroglycerin. -2D echo 12/05/2023 at [facility] showed LVEF > 55%, mild to moderately increased LV wall thickness, LA moderately dilated, RA and RV mildly dilated. -Developed chest pain on 2/25 with elevated troponins, peaking at 7551, repeat troponin on admission at [facility] 7237 -Transitioned to IV heparin, 2D echo pending -May need cardiac cath, defer to cardiology -On IV heparin drip, aspirin, statin  Active Problems: Acute metabolic encephalopathy superimposed on underlying ?mild dementia,  anxiety -Likely worsened due to UTI, hyponatremia, sodium was 119 at [facility] -This morning noted to be agitated and confused when he woke up, daughter in the room.   -Continue IV Rocephin -Julian Fowler seen an hour later with wife present in the room, alert and oriented x 4, calm and pleasant.  Continue Seroquel 50 mg nightly  UTI -Positive UA on 12/24 and was placed on IV Rocephin -Prior history of recurrent UTIs, urine cultures have shown E. coli in the past -Continue IV Rocephin, will continue for 7 days including the days received at [facility]  Acute on chronic hyponatremia - Na [facility], has been on salt tablets.  Per family, he continues to do these as he was hospitalized with severe hyponatremia in the past -Sodium 129, continue salt tabs    Permanent atrial fibrillation (HCC) -Rate controlled -Not on anticoagulation due to history of falls and skin tears    Asthma, mild intermittent, well-controlled History of asbestosis, ILD -Currently stable, no wheezing  Hypertension -Julian Fowler had originally presented with hypertensive emergency to [facility] with a BP 225/119, was placed on IV nitroglycerin drip -Continue Norvasc, hydralazine, Avapro, spironolactone BP stable    GERD -Continue PPI     Hyperlipidemia -Continue statin  OSA (obstructive sleep apnea) Not on CPAP  Obesity class II Estimated body mass index is 35.86 kg/m as calculated from the following:   Height as of this encounter: 5\' 9"  (1.753 m).   Weight as of this encounter: 110.1 kg.  Code Status: Full code DVT Prophylaxis:  IV heparin drip   Level of Care: Level of care: Progressive Family Communication: Updated Julian Fowler's daughter and wife at the bedside Disposition Plan:      Remains inpatient appropriate:      Procedures:    Consultants:   Cardiology  Antimicrobials:   Anti-infectives (From admission, onward)    Start     Dose/Rate Route Frequency Ordered Stop   12/12/23 1000  cefTRIAXone  (ROCEPHIN) 1 g in sodium chloride 0.9 % 100 mL IVPB        1 g 200 mL/hr over 30 Minutes Intravenous Every 24 hours 12/11/23 1455            Medications  amLODipine  5 mg Oral Daily   aspirin EC  81 mg Oral Daily   guaiFENesin  1,200 mg Oral BID   hydrALAZINE  100 mg Oral TID   irbesartan  75 mg Oral Daily   mometasone-formoterol  2 puff Inhalation BID   pantoprazole  40 mg Oral Daily   pravastatin  40 mg Oral QHS   QUEtiapine  50 mg Oral QHS   sodium chloride  1 g Oral TID WC   spironolactone  25 mg Oral Daily      Subjective:   Julian Fowler was seen and examined today.  Seen twice.  Earlier in the morning Julian Fowler was confused, agitated, per daughter at the bedside he was waking up and had sundowning issues.  Seen again an hour later and Julian Fowler was alert and oriented, pleasant, wife at the bedside.  Julian Fowler denies dizziness, chest pain, shortness of breath, abdominal pain, N/V/D/C.   Objective:   Vitals:   12/11/23 2007 12/11/23 2112 12/12/23 0306 12/12/23 0851  BP: (!) 157/94 (!) 151/79 (!) 168/89 (!) 160/90  Pulse: (!) 102   91  Resp: 20  19 18   Temp: 97.9 F (36.6 C)   98.3 F (36.8 C)  TempSrc: Oral   Oral  SpO2: 97%  97%   Weight:   110.1 kg   Height:        Intake/Output Summary (Last 24 hours) at 12/12/2023 1048 Last data filed at 12/12/2023 0538 Gross per 24 hour  Intake 138.1 ml  Output 700 ml  Net -561.9 ml     Wt Readings from Last 3 Encounters:  12/12/23 110.1 kg  11/19/23 109 kg  11/15/23 109.4 kg     Exam General: Alert and oriented x 3, NAD Cardiovascular: S1 S2 auscultated,  RRR Respiratory: Diminished breath sounds in the bases Gastrointestinal: Soft, nontender, nondistended, + bowel sounds Ext: no pedal edema bilaterally Neuro: no new deficits Psych: Normal affect     Data Reviewed:  I have personally reviewed following labs    CBC Lab Results  Component Value Date   WBC 8.2 12/12/2023   RBC 3.94 (L) 12/12/2023   HGB  11.9 (L) 12/12/2023   HCT 34.5 (L) 12/12/2023   MCV 87.6 12/12/2023   MCH 30.2 12/12/2023   PLT 401 (H) 12/12/2023   MCHC 34.5 12/12/2023   RDW 13.8 12/12/2023   LYMPHSABS 0.9 12/11/2023   MONOABS 0.8 12/11/2023   EOSABS 1.1 (H) 12/11/2023   BASOSABS 0.1 12/11/2023     Last  metabolic panel Lab Results  Component Value Date   NA 129 (L) 12/12/2023   K 3.9 12/12/2023   CL 96 (L) 12/12/2023   CO2 22 12/12/2023   BUN 9 12/12/2023   CREATININE 0.73 12/12/2023   GLUCOSE 97 12/12/2023   GFRNONAA >60 12/12/2023   GFRAA >60 12/31/2018   CALCIUM 8.3 (L) 12/12/2023   PHOS 3.9 11/24/2020   PROT 5.9 (L) 12/11/2023   ALBUMIN 2.5 (L) 12/11/2023   BILITOT 0.4 12/11/2023   ALKPHOS 72 12/11/2023   AST 100 (H) 12/11/2023   ALT 47 (H) 12/11/2023   ANIONGAP 11 12/12/2023    CBG (last 3)  No results for input(s): "GLUCAP" in the last 72 hours.    Coagulation Profile: No results for input(s): "INR", "PROTIME" in the last 168 hours.   Radiology Studies: I have personally reviewed the imaging studies  No results found.     Thad Ranger M.D. [facility] 12/12/2023, 10:48 AM  Available via Epic secure chat 7am-7pm After 7 pm, please refer to night coverage provider listed on amion.

## 2023-12-12 NOTE — Plan of Care (Signed)
  Problem: Education: Goal: Knowledge of General Education information will improve Description: Including pain rating scale, medication(s)/side effects and non-pharmacologic comfort measures Outcome: Progressing   Problem: Health Behavior/Discharge Planning: Goal: Ability to manage health-related needs will improve Outcome: Progressing   Problem: Clinical Measurements: Goal: Ability to maintain clinical measurements within normal limits will improve Outcome: Progressing Goal: Will remain free from infection Outcome: Progressing Goal: Diagnostic test results will improve Outcome: Progressing Goal: Respiratory complications will improve Outcome: Progressing Goal: Cardiovascular complication will be avoided Outcome: Progressing   Problem: Activity: Goal: Risk for activity intolerance will decrease Outcome: Progressing   Problem: Nutrition: Goal: Adequate nutrition will be maintained Outcome: Progressing   Problem: Coping: Goal: Level of anxiety will decrease Outcome: Progressing   Problem: Elimination: Goal: Will not experience complications related to bowel motility Outcome: Progressing Goal: Will not experience complications related to urinary retention Outcome: Progressing   Problem: Pain Managment: Goal: General experience of comfort will improve and/or be controlled Outcome: Progressing   Problem: Safety: Goal: Ability to remain free from injury will improve Outcome: Progressing   Problem: Skin Integrity: Goal: Risk for impaired skin integrity will decrease Outcome: Progressing   Problem: Education: Goal: Knowledge of disease or condition will improve Outcome: Progressing Goal: Understanding of medication regimen will improve Outcome: Progressing Goal: Individualized Educational Video(s) Outcome: Progressing   Problem: Activity: Goal: Ability to tolerate increased activity will improve Outcome: Progressing   Problem: Cardiac: Goal: Ability to achieve  and maintain adequate cardiopulmonary perfusion will improve Outcome: Progressing   Problem: Health Behavior/Discharge Planning: Goal: Ability to safely manage health-related needs after discharge will improve Outcome: Progressing   Problem: Education: Goal: Understanding of cardiac disease, CV risk reduction, and recovery process will improve Outcome: Progressing Goal: Individualized Educational Video(s) Outcome: Progressing   Problem: Activity: Goal: Ability to tolerate increased activity will improve Outcome: Progressing   Problem: Cardiac: Goal: Ability to achieve and maintain adequate cardiovascular perfusion will improve Outcome: Progressing   Problem: Health Behavior/Discharge Planning: Goal: Ability to safely manage health-related needs after discharge will improve Outcome: Progressing   Problem: Education: Goal: Understanding of CV disease, CV risk reduction, and recovery process will improve Outcome: Progressing Goal: Individualized Educational Video(s) Outcome: Progressing   Problem: Activity: Goal: Ability to return to baseline activity level will improve Outcome: Progressing   Problem: Cardiovascular: Goal: Ability to achieve and maintain adequate cardiovascular perfusion will improve Outcome: Progressing Goal: Vascular access site(s) Level 0-1 will be maintained Outcome: Progressing   Problem: Health Behavior/Discharge Planning: Goal: Ability to safely manage health-related needs after discharge will improve Outcome: Progressing

## 2023-12-13 ENCOUNTER — Inpatient Hospital Stay (HOSPITAL_COMMUNITY): Payer: Medicare Other

## 2023-12-13 DIAGNOSIS — F419 Anxiety disorder, unspecified: Secondary | ICD-10-CM | POA: Diagnosis not present

## 2023-12-13 DIAGNOSIS — J61 Pneumoconiosis due to asbestos and other mineral fibers: Secondary | ICD-10-CM | POA: Diagnosis not present

## 2023-12-13 DIAGNOSIS — I4821 Permanent atrial fibrillation: Secondary | ICD-10-CM | POA: Diagnosis not present

## 2023-12-13 DIAGNOSIS — I214 Non-ST elevation (NSTEMI) myocardial infarction: Secondary | ICD-10-CM | POA: Diagnosis not present

## 2023-12-13 DIAGNOSIS — I10 Essential (primary) hypertension: Secondary | ICD-10-CM | POA: Diagnosis not present

## 2023-12-13 DIAGNOSIS — E782 Mixed hyperlipidemia: Secondary | ICD-10-CM | POA: Diagnosis not present

## 2023-12-13 LAB — CBC
HCT: 33.9 % — ABNORMAL LOW (ref 39.0–52.0)
Hemoglobin: 11.7 g/dL — ABNORMAL LOW (ref 13.0–17.0)
MCH: 30.4 pg (ref 26.0–34.0)
MCHC: 34.5 g/dL (ref 30.0–36.0)
MCV: 88.1 fL (ref 80.0–100.0)
Platelets: 385 10*3/uL (ref 150–400)
RBC: 3.85 MIL/uL — ABNORMAL LOW (ref 4.22–5.81)
RDW: 13.9 % (ref 11.5–15.5)
WBC: 9.6 10*3/uL (ref 4.0–10.5)
nRBC: 0 % (ref 0.0–0.2)

## 2023-12-13 LAB — RENAL FUNCTION PANEL
Albumin: 2.4 g/dL — ABNORMAL LOW (ref 3.5–5.0)
Anion gap: 9 (ref 5–15)
BUN: 7 mg/dL — ABNORMAL LOW (ref 8–23)
CO2: 24 mmol/L (ref 22–32)
Calcium: 8.4 mg/dL — ABNORMAL LOW (ref 8.9–10.3)
Chloride: 98 mmol/L (ref 98–111)
Creatinine, Ser: 0.74 mg/dL (ref 0.61–1.24)
GFR, Estimated: >60 mL/min (ref >60)
Glucose, Bld: 97 mg/dL (ref 70–99)
Phosphorus: 3.9 mg/dL (ref 2.5–4.6)
Potassium: 3.7 mmol/L (ref 3.5–5.1)
Sodium: 131 mmol/L — ABNORMAL LOW (ref 135–145)

## 2023-12-13 MED ORDER — ISOSORBIDE MONONITRATE ER 60 MG PO TB24
60.0000 mg | ORAL_TABLET | Freq: Every day | ORAL | Status: DC
  Administered 2023-12-14 – 2023-12-19 (×6): 60 mg via ORAL
  Filled 2023-12-13 (×6): qty 1

## 2023-12-13 MED ORDER — IRBESARTAN 150 MG PO TABS
150.0000 mg | ORAL_TABLET | Freq: Every day | ORAL | Status: DC
  Administered 2023-12-14 – 2023-12-19 (×6): 150 mg via ORAL
  Filled 2023-12-13 (×6): qty 1

## 2023-12-13 MED ORDER — CLOPIDOGREL BISULFATE 75 MG PO TABS
75.0000 mg | ORAL_TABLET | Freq: Every day | ORAL | Status: DC
  Administered 2023-12-14 – 2023-12-19 (×6): 75 mg via ORAL
  Filled 2023-12-13 (×6): qty 1

## 2023-12-13 NOTE — TOC Initial Note (Signed)
 Transition of Care [facility]) - Initial/Assessment Note    Patient Details  Name: Julian Fowler MRN: [id number] Date of Birth: January 26, 1935  Transition of Care [facility]) CM/SW Contact:    Gala Lewandowsky, RN Phone Number: 12/13/2023, 4:23 PM  Clinical Narrative: Patient presented for chest pain. PTA patient was from home with spouse. Patient has DME cane, rolling walker and handicap equipped toilet. Spouse in the room during the visit and she provides transportation to appointments. Case Manager spoke with patient regarding home health needs and Medicare.gov list provided to the patient-patient chose [facility]. Referral submitted to liaison Elnita Maxwell and start of care to begin within 24-48 hours post transition home. Case Manager discussed DME needs and the patient declined the bedside commode. No further needs identified at this time.                  Expected Discharge Plan: Home w Home Health Services Barriers to Discharge: Continued Medical Work up   Patient Goals and CMS Choice Patient states their goals for this hospitalization and ongoing recovery are:: patient wants to return home once stable CMS Medicare.gov Compare Post Acute Care list provided to:: Patient Choice offered to / list presented to : Patient, Spouse      Expected Discharge Plan and Services   Discharge Planning Services: CM Consult Post Acute Care Choice: Home Health Living arrangements for the past 2 months: Single Family Home                           HH Arranged: PT HH Agency: [facility] Date [facility] Agency Contacted: 12/13/23 Time HH Agency Contacted: 1622 Representative spoke with at [facility] Agency: Elnita Maxwell  Prior Living Arrangements/Services Living arrangements for the past 2 months: Single Family Home Lives with:: Spouse Patient language and need for interpreter reviewed:: Yes Do you feel safe going back to the place where you live?: Yes      Need for Family Participation  in Patient Care: Yes (Comment) Care giver support system in place?: Yes (comment) Current home services: DME (cane, rolling walker, handicap toilet.) Criminal Activity/Legal Involvement Pertinent to Current Situation/Hospitalization: No - Comment as needed  Activities of Daily Living   ADL Screening (condition at time of admission) Independently performs ADLs?: Yes (appropriate for developmental age) Is the patient deaf or have difficulty hearing?: Yes Does the patient have difficulty seeing, even when wearing glasses/contacts?: No Does the patient have difficulty concentrating, remembering, or making decisions?: No  Permission Sought/Granted Permission sought to share information with : Family Supports, Case Automotive engineer granted to share info w AGENCY: [facility]        Emotional Assessment Appearance:: Appears stated age Attitude/Demeanor/Rapport: Engaged Affect (typically observed): Appropriate Orientation: : Oriented to Self, Oriented to Place, Oriented to  Time, Oriented to Situation Alcohol / Substance Use: Not Applicable Psych Involvement: No (comment)  Admission diagnosis:  NSTEMI (non-ST elevated myocardial infarction) [facility]) [I21.4] Patient Active Problem List   Diagnosis Date Noted   UTI (urinary tract infection) 12/12/2023   NSTEMI (non-ST elevated myocardial infarction) (HCC) 12/11/2023   Permanent atrial fibrillation (HCC) 12/11/2023   Constipation 11/19/2023   Chronic vasomotor rhinitis 09/08/2023   Acute hyponatremia 11/21/2020   Falls 11/21/2020   Rib fractures 11/21/2020   Erectile dysfunction due to arterial insufficiency 10/19/2020   Elevated PSA 10/19/2020   Benign prostatic hyperplasia with urinary obstruction 10/19/2020   Weak urinary stream  10/19/2020   Abdominal pain 09/22/2020   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/02/2019   ILD (interstitial lung disease) (HCC) 12/23/2018   Bronchitis 10/03/2018   Acute sinusitis 10/03/2018    Localized swelling of lower extremity 02/19/2018   Asbestosis (HCC) 08/14/2017   Rectal bleeding 01/24/2017   Hemorrhoids 12/18/2016   BRBPR (bright red blood per rectum) 12/18/2016   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   Peripheral edema 10/15/2013   Hyponatremia 07/07/2013   Hypokalemia 07/07/2013   OA (osteoarthritis) of knee 07/06/2013   Preop cardiovascular exam 06/26/2013   Chronic insomnia 09/29/2012   Cardiac murmur 08/27/2012   Hearing impaired 04/10/2012   Easy bruisability    Anxiety    Hyperlipidemia    Abdominal aorta injury    Overactive bladder    ED (erectile dysfunction)    Atrial fibrillation, chronic (HCC)    OSA (obstructive sleep apnea)    Chest tightness    Ejection fraction    Essential hypertension 12/29/2009   Syncope 09/14/2009   TOBACCO ABUSE, HX OF 07/15/2009   Backache 06/22/2009   INTESTINAL GAS 06/22/2009   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 [place] [address] Phone: [phone] Fax: [phone]     Social Drivers of Health (SDOH) Social History: SDOH Screenings   Food Insecurity: No Food Insecurity (12/11/2023)  Housing: Low Risk  (12/11/2023)  Transportation Needs: No Transportation Needs (12/11/2023)  Utilities: Not At Risk (12/11/2023)  Financial Resource Strain: Low Risk  (12/05/2023)   Received from [facility]  Physical Activity: Inactive (12/05/2023)   Received from [facility]  Social Connections: Socially Integrated (12/11/2023)  Stress: No Stress Concern Present (12/05/2023)   Received from [facility]  Tobacco Use: Medium Risk (12/12/2023)  Health Literacy: Low Risk   (12/05/2023)   Received from [facility]   SDOH Interventions:     Readmission Risk Interventions     No data to display

## 2023-12-13 NOTE — Plan of Care (Signed)
  Problem: Education: Goal: Knowledge of General Education information will improve Description: Including pain rating scale, medication(s)/side effects and non-pharmacologic comfort measures Outcome: Progressing   Problem: Health Behavior/Discharge Planning: Goal: Ability to manage health-related needs will improve Outcome: Progressing   Problem: Clinical Measurements: Goal: Ability to maintain clinical measurements within normal limits will improve Outcome: Progressing Goal: Will remain free from infection Outcome: Progressing Goal: Diagnostic test results will improve Outcome: Progressing Goal: Respiratory complications will improve Outcome: Progressing Goal: Cardiovascular complication will be avoided Outcome: Progressing   Problem: Activity: Goal: Risk for activity intolerance will decrease Outcome: Progressing   Problem: Nutrition: Goal: Adequate nutrition will be maintained Outcome: Progressing   Problem: Coping: Goal: Level of anxiety will decrease Outcome: Progressing   Problem: Elimination: Goal: Will not experience complications related to bowel motility Outcome: Progressing Goal: Will not experience complications related to urinary retention Outcome: Progressing   Problem: Pain Managment: Goal: General experience of comfort will improve and/or be controlled Outcome: Progressing   Problem: Safety: Goal: Ability to remain free from injury will improve Outcome: Progressing   Problem: Skin Integrity: Goal: Risk for impaired skin integrity will decrease Outcome: Progressing   Problem: Education: Goal: Knowledge of disease or condition will improve Outcome: Progressing Goal: Understanding of medication regimen will improve Outcome: Progressing Goal: Individualized Educational Video(s) Outcome: Progressing   Problem: Activity: Goal: Ability to tolerate increased activity will improve Outcome: Progressing   Problem: Cardiac: Goal: Ability to achieve  and maintain adequate cardiopulmonary perfusion will improve Outcome: Progressing   Problem: Health Behavior/Discharge Planning: Goal: Ability to safely manage health-related needs after discharge will improve Outcome: Progressing   Problem: Education: Goal: Understanding of cardiac disease, CV risk reduction, and recovery process will improve Outcome: Progressing Goal: Individualized Educational Video(s) Outcome: Progressing   Problem: Activity: Goal: Ability to tolerate increased activity will improve Outcome: Progressing   Problem: Cardiac: Goal: Ability to achieve and maintain adequate cardiovascular perfusion will improve Outcome: Progressing   Problem: Health Behavior/Discharge Planning: Goal: Ability to safely manage health-related needs after discharge will improve Outcome: Progressing   Problem: Education: Goal: Understanding of CV disease, CV risk reduction, and recovery process will improve Outcome: Progressing Goal: Individualized Educational Video(s) Outcome: Progressing   Problem: Activity: Goal: Ability to return to baseline activity level will improve Outcome: Progressing   Problem: Cardiovascular: Goal: Ability to achieve and maintain adequate cardiovascular perfusion will improve Outcome: Progressing Goal: Vascular access site(s) Level 0-1 will be maintained Outcome: Progressing   Problem: Health Behavior/Discharge Planning: Goal: Ability to safely manage health-related needs after discharge will improve Outcome: Progressing

## 2023-12-13 NOTE — Progress Notes (Signed)
   12/13/23 1431  What Happened  Was fall witnessed? Yes  Who witnessed fall? Anders Simmonds  Patients activity before fall ambulating-assisted  Point of contact buttocks  Was patient injured? Unsure  Provider Notification  Provider Name/Title Ripudeep Rai  Date Provider Notified 12/13/23  Time Provider Notified 1430  Method of Notification Page  Notification Reason Fall  Provider response No new orders  Date of Provider Response 12/13/23  Time of Provider Response 1434  Follow Up  Family notified Yes - comment  Time family notified 1430  Additional tests No  Simple treatment Ice  Progress note created (see row info) Yes  Adult Fall Risk Assessment  Risk Factor Category (scoring not indicated) Fall has occurred during this admission (document High fall risk)  Age 89  Fall History: Fall within 6 months prior to admission 5  Elimination; Bowel and/or Urine Incontinence 0  Elimination; Bowel and/or Urine Urgency/Frequency 2  Medications: includes PCA/Opiates, Anti-convulsants, Anti-hypertensives, Diuretics, Hypnotics, Laxatives, Sedatives, and Psychotropics 3  Patient Care Equipment 1  Mobility-Assistance 2  Mobility-Gait 2  Mobility-Sensory Deficit 0  Altered awareness of immediate physical environment 0  Impulsiveness 0  Lack of understanding of one's physical/cognitive limitations 0  Total Score 18  Patient Fall Risk Level High fall risk  Adult Fall Risk Interventions  Required Bundle Interventions *See Row Information* High fall risk - low, moderate, and high requirements implemented  Additional Interventions Use of appropriate toileting equipment (bedpan, BSC, etc.)  Screening for Fall Injury Risk (To be completed on HIGH fall risk patients) - Assessing Need for Floor Mats  Risk For Fall Injury- Criteria for Floor Mats 85 years or older  Will Implement Floor Mats Yes  Neurological  Neuro (WDL) WDL  Level of Consciousness Alert  Orientation Level Oriented X4  Cognition  Follows commands  Speech Clear  Neuro Symptoms Forgetful  Glasgow Coma Scale  Eye Opening 4  Best Verbal Response (NON-intubated) 5  Best Motor Response 6  Glasgow Coma Scale Score 15  Musculoskeletal  Musculoskeletal (WDL) X  Assistive Device Front wheel walker  Generalized Weakness Yes  Weight Bearing Restrictions Per Provider Order No  Integumentary  Integumentary (WDL) X  Skin Color Appropriate for ethnicity  Skin Condition Dry  Skin Integrity Ecchymosis  Ecchymosis Location Arm  Ecchymosis Location Orientation Bilateral  Skin Turgor Non-tenting

## 2023-12-13 NOTE — Evaluation (Signed)
 Physical Therapy Evaluation Patient Details Name: Julian Fowler MRN: [id number] DOB: 01/15/1935 Today's Date: 12/13/2023  History of Present Illness  89 y.o. male presents to [facility] hospital on 12/11/2023 from [facility] with NSTEMI and UTI. PMH includes HTN, HLD, afib, acoustic neuroma, BPH, asthma, anxiety, ILD, OSA.  Clinical Impression  Pt presents to PT with deficits in functional mobility, gait, balance, endurance, strength, power, cognition. Pt typically ambulates with support of SPC at baseline but fatigues quickly when mobilizing during this session. Pt often reaching for UE support with off hand from cane. When returning to the room the patient and this PT's foot made contact resulting in an anterior loss of balance. The pt was unable to correct this loss of balance with support of the cane, hand hold, and support from PT at gait belt. PT slowed pt descent to the floor during fall. Pt reporting L lateral ankle pain after fall, ice was applied and MD made aware of incident. PT provides education on the benefits of utilizing a RW at this time to improve balance and to aide in energy conservation. PT will continue to follow in an effort to further assess mobility. Pt has had incidences of confusion during this admission per chart review. Ideally the pt will progress quickly and be able to return home to his familiar environment.      If plan is discharge home, recommend the following: A lot of help with walking and/or transfers;A lot of help with bathing/dressing/bathroom;Assistance with cooking/housework;Direct supervision/assist for medications management;Direct supervision/assist for financial management;Assist for transportation;Help with stairs or ramp for entrance;Supervision due to cognitive status   Can travel by private vehicle        Equipment Recommendations BSC/3in1  Recommendations for Other Services       Functional Status Assessment Patient has had a recent decline in  their functional status and demonstrates the ability to make significant improvements in function in a reasonable and predictable amount of time.     Precautions / Restrictions Precautions Precautions: Fall Recall of Precautions/Restrictions: Impaired Precaution/Restrictions Comments: fall on 2/28 with PT Restrictions Weight Bearing Restrictions Per Provider Order: No      Mobility  Bed Mobility Overal bed mobility: Needs Assistance Bed Mobility: Sit to Supine       Sit to supine: Contact guard assist        Transfers Overall transfer level: Needs assistance Equipment used: Straight cane Transfers: Sit to/from Stand Sit to Stand: Contact guard assist                Ambulation/Gait Ambulation/Gait assistance: Min assist (maxA with loss of balance resulting in fall, otherwise minA with fatigue) Gait Distance (Feet): 120 Feet (additional trial of 5') Assistive device: Straight cane, 1 person hand held assist Gait Pattern/deviations: Step-to pattern, Wide base of support Gait velocity: reduced Gait velocity interpretation: <1.8 ft/sec, indicate of risk for recurrent falls   General Gait Details: pt with slowed step-to gait, reaching for UE support of railing for stability. When returning to room pt begins to fatigue, utilizing PT hand hold for final 15' of ambulation. When entering the room and approaching the bed the pt and this PT's feet made contact, resulting in an anterior loss of balance. The pt was unable to correct this loss of balance with assist of PT hand hold and maxA at gait belt. PT assisted the pt in slowing descent to the floor. PT and RN Jeris Penta) then assist the pt back to bed after post-fall.  Stairs  Wheelchair Mobility     Tilt Bed    Modified Rankin (Stroke Patients Only)       Balance Overall balance assessment: Needs assistance Sitting-balance support: No upper extremity supported, Feet supported Sitting  balance-Leahy Scale: Fair     Standing balance support: Single extremity supported, Reliant on assistive device for balance Standing balance-Leahy Scale: Poor Standing balance comment: CGA for static standing with SPC, minA with fatigue                             Pertinent Vitals/Pain Pain Assessment Pain Assessment: 0-10 Pain Score: 4  Pain Location: chest (pt also reporting L lateral ankle pain after fall during evaluation) Pain Descriptors / Indicators: Pressure Pain Intervention(s): Limited activity within patient's tolerance    Home Living Family/patient expects to be discharged to:: Private residence Living Arrangements: Spouse/significant other Available Help at Discharge: Family;Available 24 hours/day Type of Home: House Home Access: Stairs to enter Entrance Stairs-Rails: None Entrance Stairs-Number of Steps: 1   Home Layout: Laundry or work area in basement (stair lift to basement) Home Equipment: Agricultural consultant (2 wheels);Cane - single point;Rollator (4 wheels);Shower seat - built in;Grab bars - toilet;Grab bars - tub/shower      Prior Function Prior Level of Function : Independent/Modified Independent             Mobility Comments: ambulatory with [facility]       Extremity/Trunk Assessment   Upper Extremity Assessment Upper Extremity Assessment: Generalized weakness    Lower Extremity Assessment Lower Extremity Assessment: Generalized weakness    Cervical / Trunk Assessment Cervical / Trunk Assessment: Kyphotic  Communication   Communication Communication: Impaired Factors Affecting Communication: Hearing impaired    Cognition Arousal: Alert Behavior During Therapy: WFL for tasks assessed/performed   PT - Cognitive impairments: Memory, Awareness, Safety/Judgement                         Following commands: Intact       Cueing Cueing Techniques: Verbal cues, Gestural cues     General Comments General comments (skin  integrity, edema, etc.): pt reporting L lateral ankle pain after fall, PT notes possible swelling at L lateral ankle however both ankles do appear to be mildly edematous. PT applies ice to L ankle along with elevation.    Exercises     Assessment/Plan    PT Assessment Patient needs continued PT services  PT Problem List Decreased strength;Decreased activity tolerance;Decreased balance;Decreased mobility;Cardiopulmonary status limiting activity;Decreased knowledge of use of DME;Decreased safety awareness;Decreased knowledge of precautions;Pain       PT Treatment Interventions DME instruction;Gait training;Stair training;Functional mobility training;Therapeutic activities;Therapeutic exercise;Balance training;Neuromuscular re-education;Cognitive remediation;Patient/family education    PT Goals (Current goals can be found in the Care Plan section)  Acute Rehab PT Goals Patient Stated Goal: to improve strength and endurance, return to baseline PT Goal Formulation: With patient/family Time For Goal Achievement: 12/27/23 Potential to Achieve Goals: Fair    Frequency Min 1X/week     Co-evaluation               AM-PAC PT "6 Clicks" Mobility  Outcome Measure Help needed turning from your back to your side while in a flat bed without using bedrails?: A Little Help needed moving from lying on your back to sitting on the side of a flat bed without using bedrails?: A Little Help needed moving to and from a bed to a  chair (including a wheelchair)?: A Little Help needed standing up from a chair using your arms (e.g., wheelchair or bedside chair)?: A Little Help needed to walk in hospital room?: A Lot Help needed climbing 3-5 steps with a railing? : Total 6 Click Score: 15    End of Session Equipment Utilized During Treatment: Gait belt Activity Tolerance: Patient limited by fatigue Patient left: in bed;with call bell/phone within reach;with bed alarm set;with family/visitor  present Nurse Communication: Mobility status PT Visit Diagnosis: Other abnormalities of gait and mobility (R26.89);Muscle weakness (generalized) (M62.81)    Time: 1250-1315 PT Time Calculation (min) (ACUTE ONLY): 25 min   Charges:   PT Evaluation $PT Eval Low Complexity: 1 Low   PT General Charges $$ ACUTE PT VISIT: 1 Visit         Arlyss Gandy, PT, DPT Acute Rehabilitation Office [phone]   Arlyss Gandy 12/13/2023, 3:20 PM

## 2023-12-13 NOTE — Progress Notes (Addendum)
 [facility]                                                                              Julian Fowler, is a 89 y.o. male, DOB - 1935/08/11, WGN:[id number] Admit date - 12/11/2023    Outpatient Primary MD for the patient is [name], [name], MD  LOS - 2  days  No chief complaint on file.      Brief summary   Patient is a 89 year old male with HTN, hyperlipidemia, GERD, A-fib, cerebrovascular AV fistula, acoustic neuroma, BPH, asthma anxiety, ILD, asbestosis, OSA presented from [facility] with NSTEMI.    Patient initially presented dated on 2/19 with hypertensive emergency, dizziness, fogginess.  He was treated with nitroglycerin infusion and weaned off to p.o. medications.    He was also found to have UTI, placed on IV Rocephin, developed delirium and agitation secondary to sundowning.  He had a negative MRI. On 2/25, patient had a significant crushing chest painEKG showed evidence of ST depression diffusely and elevated troponin to 1819.  He was treated with aspirin, nitroglycerin, Lovenox and metoprolol.  He was transferred to [facility] for NSTEMI evaluation, management and cardiac cath.  Cardiology was consulted.   Troponin noted to be continuing to trend up, here 7237.   Assessment & Plan    Principal Problem:   NSTEMI (non-ST elevated myocardial infarction) (HCC) -Currently no chest pain.  Initially admitted at [facility] with hypertensive emergency, treated with IV nitroglycerin. -2D echo 12/05/2023 at [facility] showed LVEF > 55%, mild to moderately increased LV wall thickness, LA moderately dilated, RA and RV mildly dilated. -Developed chest pain on 2/25 with elevated troponins, peaking at 7551, repeat troponin on admission at [facility] 7237 -2D echo showed low normal EF of 50 to 55% with no regional wall motion abnormalities Cardiology following, has completed 48 hours of IV heparin. - Recommended aspirin and Plavix for 3 months then continue aspirin 81 mg  daily -Continue pravastatin, Imdur   Acute metabolic encephalopathy superimposed on underlying ?mild dementia, anxiety -Likely worsened due to UTI, hyponatremia, sodium was 119 at [facility] -Continue IV Rocephin -Currently alert and oriented x 4, wife at the bedside, appears at his baseline. -PT OT evaluation pending -Continue Seroquel (will need prescription at discharge)  UTI -Positive UA on 12/24 and was placed on IV Rocephin -Prior history of recurrent UTIs, urine cultures have shown E. coli in the past -Continue IV Rocephin, will continue for 7 days including the days received at [facility], transition to oral keflex tomorrow at discharge   Acute on chronic hyponatremia - Na 119 at [facility], has been on salt tablets.  Per family, he continues to do these as he was hospitalized with severe hyponatremia in the past -Sodium improving, 131, continue salt tabs    Permanent atrial fibrillation (HCC) -Rate controlled -Not on anticoagulation due to history of falls and skin tears    Asthma, mild intermittent, well-controlled History of asbestosis, ILD -Currently stable, no wheezing  Hypertension -Patient had originally presented with hypertensive emergency to [facility] with a BP 225/119, was placed on IV nitroglycerin drip -Continue Norvasc, hydralazine, Aldactone.  -Avapro increased to  150 mg daily    GERD -Continue PPI     Hyperlipidemia -Continue statin     OSA (obstructive sleep apnea) Not on CPAP  Obesity class II Estimated body mass index is 35.86 kg/m as calculated from the following:   Height as of this encounter: 5\' 9"  (1.753 m).   Weight as of this encounter: 110.1 kg.  Code Status: Full code DVT Prophylaxis:  IV heparin drip   Level of Care: Level of care: Progressive Family Communication: Updated patient's wife at the bedside Disposition Plan:      Remains inpatient appropriate:  patient and wife requested discharge tomorrow    Procedures:  2D echo    Consultants:   Cardiology  Antimicrobials:   Anti-infectives (From admission, onward)    Start     Dose/Rate Route Frequency Ordered Stop   12/12/23 1000  cefTRIAXone (ROCEPHIN) 1 g in sodium chloride 0.9 % 100 mL IVPB        1 g 200 mL/hr over 30 Minutes Intravenous Every 24 hours 12/11/23 1455            Medications  amLODipine  10 mg Oral Daily   [START ON 12/14/2023] clopidogrel  75 mg Oral Daily   guaiFENesin  1,200 mg Oral BID   hydrALAZINE  100 mg Oral TID   [START ON 12/14/2023] irbesartan  150 mg Oral Daily   isosorbide mononitrate  30 mg Oral Daily   mometasone-formoterol  2 puff Inhalation BID   pantoprazole  40 mg Oral Daily   pravastatin  40 mg Oral QHS   QUEtiapine  50 mg Oral QHS   sodium chloride  1 g Oral TID WC   spironolactone  25 mg Oral Daily      Subjective:   Julian Fowler was seen and examined today.  Sitting up in the chair, BP somewhat elevated otherwise no acute complaints, no chest pain or shortness of breath, dizziness or lightheadedness.  Wife concerned about his ambulation and generalized weakness.  No nausea vomiting, abdominal pain. Objective:   Vitals:   12/13/23 0458 12/13/23 0731 12/13/23 0736 12/13/23 1154  BP: (!) 150/79 (!) 161/88 (!) 161/88 (!) 137/106  Pulse: 87 93 91 88  Resp: 17 20  17   Temp: 98.7 F (37.1 C) 98.6 F (37 C)  98.1 F (36.7 C)  TempSrc: Oral Oral  Oral  SpO2: 97% 98% 96% 96%  Weight:      Height:        Intake/Output Summary (Last 24 hours) at 12/13/2023 1412 Last data filed at 12/13/2023 0857 Gross per 24 hour  Intake 340 ml  Output 1900 ml  Net -1560 ml     Wt Readings from Last 3 Encounters:  12/12/23 110.1 kg  11/19/23 109 kg  11/15/23 109.4 kg   Physical Exam General: Alert and oriented x 3, NAD Cardiovascular: Irregular Respiratory: Diminished breath sound at the bases Gastrointestinal: Soft, nontender, nondistended, NBS Ext: no pedal edema bilaterally Neuro: no new  deficits Psych: Normal affect       Data Reviewed:  I have personally reviewed following labs    CBC Lab Results  Component Value Date   WBC 9.6 12/13/2023   RBC 3.85 (L) 12/13/2023   HGB 11.7 (L) 12/13/2023   HCT 33.9 (L) 12/13/2023   MCV 88.1 12/13/2023   MCH 30.4 12/13/2023   PLT 385 12/13/2023   MCHC 34.5 12/13/2023   RDW 13.9 12/13/2023   LYMPHSABS 0.9 12/11/2023   MONOABS 0.8 12/11/2023  EOSABS 1.1 (H) 12/11/2023   BASOSABS 0.1 12/11/2023     Last metabolic panel Lab Results  Component Value Date   NA 131 (L) 12/13/2023   K 3.7 12/13/2023   CL 98 12/13/2023   CO2 24 12/13/2023   BUN 7 (L) 12/13/2023   CREATININE 0.74 12/13/2023   GLUCOSE 97 12/13/2023   GFRNONAA >60 12/13/2023   GFRAA >60 12/31/2018   CALCIUM 8.4 (L) 12/13/2023   PHOS 3.9 12/13/2023   PROT 5.9 (L) 12/11/2023   ALBUMIN 2.4 (L) 12/13/2023   BILITOT 0.4 12/11/2023   ALKPHOS 72 12/11/2023   AST 100 (H) 12/11/2023   ALT 47 (H) 12/11/2023   ANIONGAP 9 12/13/2023    CBG (last 3)  No results for input(s): "GLUCAP" in the last 72 hours.    Coagulation Profile: No results for input(s): "INR", "PROTIME" in the last 168 hours.   Radiology Studies: I have personally reviewed the imaging studies  ECHOCARDIOGRAM LIMITED Result Date: 12/12/2023    ECHOCARDIOGRAM LIMITED REPORT   Patient Name:   ANTONIA CULBERTSON Date of Exam: 12/12/2023 Medical Rec #:  [id number]       Height:       69.0 in Accession #:    [id number]      Weight:       242.8 lb Date of Birth:  03-Jan-1935       BSA:          2.243 m Patient Age:    88 years        BP:           160/90 mmHg Patient Gender: M               HR:           88 bpm. Exam Location:  Inpatient Procedure: Limited Echo, Cardiac Doppler, Color Doppler and Intracardiac            Opacification Agent (Both Spectral and Color Flow Doppler were            utilized during procedure). Indications:    ; 122-I22.9 Subsequent ST elevation (STEM) and non-ST elevation                  (NSTEMI) myocardial infarction  History:        Patient has prior history of Echocardiogram examinations, most                 recent 05/20/2023. Previous Myocardial Infarction, Acute MI and                 CAD, Abnormal ECG, Aortic Valve Disease, Arrythmias:Atrial                 Fibrillation, Signs/Symptoms:Murmur; Risk Factors:Hypertension,                 Dyslipidemia, Sleep Apnea and Current Smoker. Aortic stenosis.  Sonographer:    Sheralyn Boatman RDCS Referring Phys: 19 DAVID W [facility]  Sonographer Comments: Technically difficult study due to poor echo windows. Image acquisition challenging due to patient body habitus. Limited echo to assess wall motion. IMPRESSIONS  1. Left ventricular ejection fraction, by estimation, is 50 to 55%. The left ventricle has low normal function. The left ventricle has no regional wall motion abnormalities. There is moderate concentric left ventricular hypertrophy. Left ventricular diastolic parameters are indeterminate.  2. The mitral valve is degenerative. Trivial mitral valve regurgitation.  3. The aortic valve is tricuspid. There is moderate calcification of  the aortic valve. Aortic valve regurgitation is not visualized. Mild to moderate aortic valve stenosis. Aortic valve area, by VTI measures 1.51 cm. Aortic valve mean gradient measures  12.0 mmHg. Aortic valve Vmax measures 2.40 m/s. Comparison(s): Prior images reviewed side by side. Slight decrease in stroke volume index and DVI; prior study did not use contrast. FINDINGS  Left Ventricle: Left ventricular ejection fraction, by estimation, is 50 to 55%. The left ventricle has low normal function. The left ventricle has no regional wall motion abnormalities. Definity contrast agent was given IV to delineate the left ventricular endocardial borders. There is moderate concentric left ventricular hypertrophy. Left ventricular diastolic parameters are indeterminate. Mitral Valve: The mitral valve is degenerative in  appearance. Trivial mitral valve regurgitation. Tricuspid Valve: The tricuspid valve is normal in structure. Tricuspid valve regurgitation is not demonstrated. No evidence of tricuspid stenosis. Aortic Valve: The aortic valve is tricuspid. There is moderate calcification of the aortic valve. Aortic valve regurgitation is not visualized. Mild to moderate aortic stenosis is present. Aortic valve mean gradient measures 12.0 mmHg. Aortic valve peak gradient measures 23.0 mmHg. Aortic valve area, by VTI measures 1.51 cm. Additional Comments: Spectral Doppler performed. Color Doppler performed.  LEFT VENTRICLE PLAX 2D LVIDd:         5.50 cm LVIDs:         4.10 cm LV PW:         1.30 cm LV IVS:        1.50 cm LVOT diam:     2.40 cm LV SV:         66 LV SV Index:   29 LVOT Area:     4.52 cm  LV Volumes (MOD) LV vol d, MOD A2C: 159.0 ml LV vol d, MOD A4C: 129.0 ml LV vol s, MOD A2C: 65.8 ml LV vol s, MOD A4C: 77.2 ml LV SV MOD A2C:     93.2 ml LV SV MOD A4C:     129.0 ml LV SV MOD BP:      73.0 ml IVC IVC diam: 2.00 cm LEFT ATRIUM         Index LA diam:    5.50 cm 2.45 cm/m  AORTIC VALVE AV Area (Vmax):    1.74 cm AV Area (Vmean):   1.76 cm AV Area (VTI):     1.51 cm AV Vmax:           240.00 cm/s AV Vmean:          160.000 cm/s AV VTI:            0.437 m AV Peak Grad:      23.0 mmHg AV Mean Grad:      12.0 mmHg LVOT Vmax:         92.40 cm/s LVOT Vmean:        62.400 cm/s LVOT VTI:          0.146 m LVOT/AV VTI ratio: 0.33  AORTA Ao Root diam: 3.90 cm TRICUSPID VALVE TR Peak grad:   30.5 mmHg TR Vmax:        276.00 cm/s  SHUNTS Systemic VTI:  0.15 m Systemic Diam: 2.40 cm Riley Lam MD Electronically signed by Riley Lam MD Signature Date/Time: 12/12/2023/1:19:38 PM    Final        Thad Ranger M.D. [facility] 12/13/2023, 2:12 PM  Available via Epic secure chat 7am-7pm After 7 pm, please refer to night coverage provider listed on amion.

## 2023-12-13 NOTE — Progress Notes (Addendum)
 Patient Name: Julian Fowler Date of Encounter: 12/13/2023 [facility] Cardiologist: Nona Dell, MD   Interval Summary  .    Patient seems to be doing better today. Made aware of his echo results He denies any current chest pain, states that he only feels some mild chest pain, occasionally Says he feels a bit swollen but he attributes that to the sodium supplementation he has been having  His wife says that he has not had anyone come and help him walk around  Feels better now because he has had a bath, was still waiting to have his hair washed.  Has been walking around in the room but not yet in the hallway. He has some chest soreness on the right upper chest from where he rolled over on the telemetry box, but has not had any other chest pain.  Vital Signs .    Vitals:   12/12/23 2039 12/13/23 0458 12/13/23 0731 12/13/23 0736  BP: (!) 153/75 (!) 150/79 (!) 161/88 (!) 161/88  Pulse: 77 87 93 91  Resp: 16 17 20    Temp: 98.2 F (36.8 C) 98.7 F (37.1 C) 98.6 F (37 C)   TempSrc: Oral Oral Oral   SpO2: 98% 97% 98% 96%  Weight:      Height:        Intake/Output Summary (Last 24 hours) at 12/13/2023 0749 Last data filed at 12/13/2023 0500 Gross per 24 hour  Intake 517 ml  Output 1300 ml  Net -783 ml      12/12/2023    3:06 AM 12/11/2023    1:12 PM 12/11/2023   12:54 PM  Last 3 Weights  Weight (lbs) 242 lb 12.8 oz 238 lb 238 lb  Weight (kg) 110.133 kg 107.956 kg 107.956 kg     Telemetry/ECG    Atrial fibrillation, relatively rate controlled with episodes this AM up to 110-120s, was resting - Personally Reviewed  Physical Exam .   GEN: No acute distress, seems to be doing less confused then yesterday Neck: No JVD Cardiac: irregular rhythm, regular rate, no murmurs, rubs, or gallops.  Respiratory: diminished breath sounds at bases. GI: Soft, nontender, non-distended  MS: No edema  Assessment & Plan .     NSTEMI - Admitted to [facility] 2/19 with  weakness and fatigue -- found to have HTN urgency and treated with nitroglycerin drip - Echo 12/05/23 ([facility]): LVEF > 55%. Mild to moderately increased LV wall thickness. Mitral annular calcification is present. Aortic sclerosis present. LA moderately dilated. Right ventricle mildly dilated. RA mildly dilated.  - Developed chest pain on 2/25 with elevated troponin, peak 7,551, transferred to [facility] - Updated limited echo this admission showed: EF 50-55%, no LV RWMA, moderate LVH, trivial MR, mild to moderate AS (mean gradient 12 mmHg) - Finished 48 hours of IV heparin  - Due to level of confusion and agitation deemed not an appropriate candidate for an invasive evaluation, his family present agreed  Continue ASA 81 mg daily -based on concerns of significant bleeding bruising, he has not been on DOAC for his A-fib and did not want to be on Plavix, I think it would be a reasonable thing to consider at least a month (but hopefully 3 months) of Plavix monotherapy, followed by aspirin 81 mg monotherapy to complete 1 year. Both these agents could be held for bleeding or bruising. Continue pravastatin 40 mg daily  Continue Imdur 30 mg daily Need to see that he does well ambulating in the  hallway without any active chest pain or pressure prior to considering discharge.  Hypertension  Presented with BP of 225/119 to [facility] Most recent BP 161/88, before morning medications given Continue amlodipine 10 mg daily Continue hydralazine 100 mg TID Continue Imdur 30 mg daily Continue irbesartan 75 mg daily => increase to 150 mg  Continue spironolactone 25 mg daily  Permanent atrial fibrillation - rate control only Most recent HR 91 Remains rate-controlled, typically in 70-90s Had a brief episode this AM with HR in 110-120s -- wife reports patient was resting at this time, seems to remain in 70-80s when resting in the room  Previously failed BB due to bradycardia  Not on any blood thinners due to fall  history and skin tears, per wife  Hyperlipidemia  12/11/2023: ALT 47 12/12/2023: HDL 41; LDL Cholesterol 67  Continue pravastatin 40 mg daily   Per primary Acute on chronic hyponatremia UTI Edema GERD ILD Asthma OSA       Signed, Olena Leatherwood, PA-C   ATTENDING ATTESTATION  I have seen, examined and evaluated the patient this morning on rounds along with Evlyn Clines, PA.  After reviewing all the available data and chart, we discussed the patients laboratory, study & physical findings as well as symptoms in detail.  I agree with her findings, examination as well as impression recommendations as per our discussion.    Attending adjustments noted in italics.   -> Seems to be relatively low.  Echocardiogram shows low normal EF of 50 to 55% with no RWMA which is very reassuring given the extent of the EKG changes and troponin elevation.  Shared decision making between myself and the family based on discussion about treatment options for non-STEMI led to the decision to opt for early conservative management as opposed to early invasive management with cardiac catheterization.  Based on the fact that his echocardiogram is relatively reassuring, plan will be to continue with medical management.  He has completed 48 hours of anticoagulation.  Agreeable to aspirin, and for short-term Plavix monotherapy.  Will plan for 3 months Plavix (without load) and then convert to aspirin 81 mg daily.  Blood pressure still not adequately controlled.  We increased amlodipine to 10 mg and now increasing irbesartan 150 mg.  Continue hydralazine and Imdur, not on beta-blocker because of history of bradycardia.  At this point, I think we have reached the end of medical management for his non-STEMI.  He is still on IV antibiotics being managed by primary team. Cardiology will monitor at least 1 more day while he is here, in case issues arise.  But otherwise stable for discharge from a cardiac standpoint  that he is able to ambulate in the hallway without any notable chest pain or pressure.    Marykay Lex, MD, MS Bryan Lemma, M.D., M.S. Interventional Cardiologist  [facility]  Pager # [phone] Phone # [phone] [address] [place], [place] [address]  For questions or updates, please contact [facility] Please consult [email or web address] for contact info under

## 2023-12-13 NOTE — Progress Notes (Signed)
 PT Cancellation Note  Patient Details Name: Julian Fowler MRN: [id number] DOB: May 09, 1935   Cancelled Treatment:    Reason Eval/Treat Not Completed: Fatigue/lethargy limiting ability to participate. Pt is fatigued from recently ambulating to the bathroom. Pt requests some time to rest before attempting hallway ambulation. PT will follow up as time allows.   Arlyss Gandy 12/13/2023, 12:44 PM

## 2023-12-13 NOTE — Progress Notes (Addendum)
   RN notified MD that patient was reporting ongoing chest pain. After going up to speak with patient and his wife they report that he was walking in the hall with PT without any issues or angina. He then states that when he returned to his room and was near his bed he and the PT's foot made contact which resulted in the patient losing his balance and falling to the ground. He states he did not hit his head and was assisted to the floor slowly by PT.   Patient told me that he experienced brief 3/10 chest pain after sustaining the fall, which he attributes to the stress of the situation. He believes it did not last more than a couple of minutes and it improved with rest. He denies being given any nitroglycerin or other medical therapy.   His only complaint at this time is left ankle pain from his fall. He currently has ice on his ankle. He has full range of motion and intact sensation to his left ankle. He states he is waiting for an x-ray to be done, order placed by primary.   He denies any current chest pain and does not want to change his treatment plan at this time, still denying a cardiac catheterization. He has no cardiac complaints at this time. Notified Dr. Herbie Baltimore and he increased patient's Imdur from 30 mg to 60 mg. Instructed RN to notify us if anything changes. Patient will likely still be okay to discharge tomorrow.   Olena Leatherwood, PA-C 12/13/2023 4:22 PM

## 2023-12-14 ENCOUNTER — Inpatient Hospital Stay (HOSPITAL_COMMUNITY)

## 2023-12-14 DIAGNOSIS — I214 Non-ST elevation (NSTEMI) myocardial infarction: Secondary | ICD-10-CM | POA: Diagnosis not present

## 2023-12-14 LAB — CBC
HCT: 34.8 % — ABNORMAL LOW (ref 39.0–52.0)
Hemoglobin: 12 g/dL — ABNORMAL LOW (ref 13.0–17.0)
MCH: 30.2 pg (ref 26.0–34.0)
MCHC: 34.5 g/dL (ref 30.0–36.0)
MCV: 87.7 fL (ref 80.0–100.0)
Platelets: 390 10*3/uL (ref 150–400)
RBC: 3.97 MIL/uL — ABNORMAL LOW (ref 4.22–5.81)
RDW: 13.9 % (ref 11.5–15.5)
WBC: 12.8 10*3/uL — ABNORMAL HIGH (ref 4.0–10.5)
nRBC: 0 % (ref 0.0–0.2)

## 2023-12-14 LAB — RENAL FUNCTION PANEL
Albumin: 2.6 g/dL — ABNORMAL LOW (ref 3.5–5.0)
Anion gap: 5 (ref 5–15)
BUN: 7 mg/dL — ABNORMAL LOW (ref 8–23)
CO2: 27 mmol/L (ref 22–32)
Calcium: 8.3 mg/dL — ABNORMAL LOW (ref 8.9–10.3)
Chloride: 96 mmol/L — ABNORMAL LOW (ref 98–111)
Creatinine, Ser: 0.76 mg/dL (ref 0.61–1.24)
GFR, Estimated: >60 mL/min (ref >60)
Glucose, Bld: 98 mg/dL (ref 70–99)
Phosphorus: 3.4 mg/dL (ref 2.5–4.6)
Potassium: 4.3 mmol/L (ref 3.5–5.1)
Sodium: 128 mmol/L — ABNORMAL LOW (ref 135–145)

## 2023-12-14 LAB — LIPOPROTEIN A (LPA): Lipoprotein (a): 254.2 nmol/L — ABNORMAL HIGH (ref <75.0)

## 2023-12-14 MED ORDER — ASPIRIN 81 MG PO CHEW
81.0000 mg | CHEWABLE_TABLET | Freq: Every day | ORAL | Status: DC
  Administered 2023-12-14 – 2023-12-16 (×3): 81 mg via ORAL
  Filled 2023-12-14 (×3): qty 1

## 2023-12-14 MED ORDER — DICLOFENAC SODIUM 1 % EX GEL
2.0000 g | Freq: Four times a day (QID) | CUTANEOUS | Status: DC
  Administered 2023-12-14 – 2023-12-16 (×11): 2 g via TOPICAL
  Filled 2023-12-14: qty 100

## 2023-12-14 MED ORDER — TRAMADOL HCL 50 MG PO TABS
25.0000 mg | ORAL_TABLET | Freq: Two times a day (BID) | ORAL | Status: DC | PRN
  Administered 2023-12-14 – 2023-12-19 (×3): 25 mg via ORAL
  Filled 2023-12-14 (×3): qty 1

## 2023-12-14 NOTE — Evaluation (Signed)
 Physical Therapy Evaluation Patient Details Name: Julian Fowler MRN: [id number] DOB: 01/11/1935 Today's Date: 12/14/2023  History of Present Illness  89 y.o. male presents to [facility] hospital on 12/11/2023 from [facility] with NSTEMI and UTI. Of note, pt had a fall on 12/13/23 and received a left fibular fracture. PMH includes HTN, HLD, afib, acoustic neuroma, BPH, asthma, anxiety, ILD, OSA.  Clinical Impression  Re-evaluated pt s/t left fibular fracture on 12/13/23. Pt was able to get to EOB but was unable to stand or perform a transfer s/t LE strength and pain in R and L feet. Pt maintained weight bearing precautions but required additional assistance of LE elevation during lateral scooting. Pt now presents with deficits in LE strength and AROM, functional mobility, pain, and balance. Given pt's PLOF, available family support, anticipated prognosis, and condition pt would benefit from intense inpatient rehab > 3 hours upon d/c and continued acute care PT. Will continue to follow acutely.          If plan is discharge home, recommend the following: Two people to help with walking and/or transfers;Two people to help with bathing/dressing/bathroom;Assistance with cooking/housework;Assist for transportation;Help with stairs or ramp for entrance   Can travel by private vehicle        Equipment Recommendations BSC/3in1;Rolling walker (2 wheels);Wheelchair (measurements PT)  Recommendations for Other Services  Rehab consult    Functional Status Assessment Patient has had a recent decline in their functional status and demonstrates the ability to make significant improvements in function in a reasonable and predictable amount of time.     Precautions / Restrictions Precautions Precautions: Fall Recall of Precautions/Restrictions: Impaired Precaution/Restrictions Comments: fall on 2/28 with PT Required Braces or Orthoses: Other Brace (CAM boot) Restrictions Weight Bearing Restrictions Per  Provider Order: Yes LLE Weight Bearing Per Provider Order: Touchdown weight bearing (Per secure chat by Dr. Alvino Chapel, Dr. Linna Caprice stated TDWB with CAM boot; ortho note not completed yet prior to session) Other Position/Activity Restrictions: CAM boot on      Mobility  Bed Mobility Overal bed mobility: Needs Assistance Bed Mobility: Supine to Sit, Sit to Supine     Supine to sit: Max assist, +2 for physical assistance, Used rails, HOB elevated Sit to supine: +2 for physical assistance, Mod assist, +2 for safety/equipment   General bed mobility comments: Pt required mod A for LE elevation during sit to supine. Max A x2 for trunk elevation and L LE management. Cueing for proper sequencing required    Transfers Overall transfer level: Needs assistance Equipment used: 2 person hand held assist               General transfer comment: Pt was unable to attempt full transfer s/t pain in L and R foot and LE weakness. Pt was able to laterally scoot on the bed R and L with mod A x2 and another therapist holding L LE elevated.    Ambulation/Gait               General Gait Details: Not attempted s/t weight bearing precautions and LE weakness  Stairs            Wheelchair Mobility     Tilt Bed    Modified Rankin (Stroke Patients Only)       Balance Overall balance assessment: Needs assistance Sitting-balance support: Feet supported, Single extremity supported, Bilateral upper extremity supported Sitting balance-Leahy Scale: Fair Sitting balance - Comments: Pt sits EOB with posterior lean and prefers to sit with at least  one UE supported. No LOB noted Postural control: Posterior lean     Standing balance comment: Not completed s/t weight bearing precautions and LE weakness                             Pertinent Vitals/Pain Pain Assessment Pain Assessment: Faces Faces Pain Scale: Hurts little more Pain Location: L foot Pain Descriptors / Indicators:  Discomfort, Grimacing, Guarding, Sore Pain Intervention(s): Limited activity within patient's tolerance, Monitored during session, Ice applied    Home Living Family/patient expects to be discharged to:: Private residence Living Arrangements: Spouse/significant other Available Help at Discharge: Family;Available 24 hours/day Type of Home: House Home Access: Stairs to enter Entrance Stairs-Rails: None Entrance Stairs-Number of Steps: 1   Home Layout: Laundry or work area in basement Home Equipment: Agricultural consultant (2 wheels);Cane - single point;Rollator (4 wheels);Shower seat - built in;Grab bars - toilet;Grab bars - tub/shower      Prior Function Prior Level of Function : Independent/Modified Independent             Mobility Comments: ambulatory with [facility]       Extremity/Trunk Assessment   Upper Extremity Assessment Upper Extremity Assessment: Defer to OT evaluation    Lower Extremity Assessment Lower Extremity Assessment: Generalized weakness;RLE deficits/detail;LLE deficits/detail RLE Deficits / Details: Bruising of R great toe and foot swelling. Pt reported R foot as being sore LLE Deficits / Details: in CAM boot s/p fibula fracture    Cervical / Trunk Assessment Cervical / Trunk Assessment: Kyphotic  Communication   Communication Communication: Impaired Factors Affecting Communication: Hearing impaired    Cognition Arousal: Alert Behavior During Therapy: WFL for tasks assessed/performed   PT - Cognitive impairments: No apparent impairments                         Following commands: Intact       Cueing Cueing Techniques: Verbal cues, Gestural cues     General Comments General comments (skin integrity, edema, etc.): Daughter present during session.    Exercises     Assessment/Plan    PT Assessment Patient needs continued PT services  PT Problem List Decreased strength;Decreased range of motion;Decreased activity tolerance;Decreased  balance;Decreased mobility;Decreased coordination;Decreased knowledge of use of DME;Decreased knowledge of precautions;Cardiopulmonary status limiting activity;Pain       PT Treatment Interventions DME instruction;Stair training;Gait training;Functional mobility training;Therapeutic activities;Therapeutic exercise;Balance training;Neuromuscular re-education;Patient/family education;Wheelchair mobility training    PT Goals (Current goals can be found in the Care Plan section)  Acute Rehab PT Goals Patient Stated Goal: improve mobility PT Goal Formulation: With patient/family Time For Goal Achievement: 12/28/23 Potential to Achieve Goals: Good    Frequency Min 1X/week     Co-evaluation PT/OT/SLP Co-Evaluation/Treatment: Yes Reason for Co-Treatment: Complexity of the patient's impairments (multi-system involvement);For patient/therapist safety;To address functional/ADL transfers PT goals addressed during session: Mobility/safety with mobility;Balance;Proper use of DME OT goals addressed during session: ADL's and self-care;Strengthening/ROM       AM-PAC PT "6 Clicks" Mobility  Outcome Measure Help needed turning from your back to your side while in a flat bed without using bedrails?: Total Help needed moving from lying on your back to sitting on the side of a flat bed without using bedrails?: Total Help needed moving to and from a bed to a chair (including a wheelchair)?: Total Help needed standing up from a chair using your arms (e.g., wheelchair or bedside chair)?: Total Help needed  to walk in hospital room?: Total Help needed climbing 3-5 steps with a railing? : Total 6 Click Score: 6    End of Session Equipment Utilized During Treatment: Gait belt Activity Tolerance: Patient limited by pain Patient left: in bed;with call bell/phone within reach;with bed alarm set;with family/visitor present Nurse Communication: Mobility status PT Visit Diagnosis: Unsteadiness on feet  (R26.81);Other abnormalities of gait and mobility (R26.89);Muscle weakness (generalized) (M62.81);Difficulty in walking, not elsewhere classified (R26.2);Pain Pain - Right/Left: Left Pain - part of body: Ankle and joints of foot    Time: 1510-1552 PT Time Calculation (min) (ACUTE ONLY): 42 min   Charges:   PT Evaluation $PT Re-evaluation: 1 Re-eval   PT General Charges $$ ACUTE PT VISIT: 1 Visit         [address], SPT   [place] 12/14/2023, 5:12 PM

## 2023-12-14 NOTE — Consult Note (Cosign Needed Addendum)
 ORTHOPAEDIC CONSULTATION  REQUESTING PHYSICIAN: Noralee Stain, DO  PCP:  Juliette Alcide, MD  Chief Complaint: Left ankle pain  HPI: Julian Fowler is a 89 y.o. male who is currently admitted for NSTEMI evaluation and UTI. He was up working with PT when the PT's foot got caught up with the patients resulting with loss of balance and fall with PT assisting him to the floor. He denies hitting his head or LOC. Patient reporting increased left foot pain after fall. X-rays showed closed displaced distal fibular fracture. Orthopedics consulted for evaluation of left fibular fracture.  He reports pain with movement, at the lateral aspect of his left ankle. He denies any tingling or numbness in left foot, reported numbness in RLE from neuropathy. He denies any other pain or injury.   He is currently on plavix and aspirin per cardiology.   Past Medical History:  Diagnosis Date   Allergic rhinitis    Aortic stenosis    Arthritis    Atrial fibrillation (HCC)    BPH (benign prostatic hyperplasia)    Coronary artery calcification seen on CT scan    Easy bruisability    ED (erectile dysfunction)    Essential hypertension    GERD (gastroesophageal reflux disease)    H/O hiatal hernia    Hearing loss in left ear    History of asbestosis    History of shingles    Hyperlipidemia    Overactive bladder    PSVT (paroxysmal supraventricular tachycardia) (HCC)    Sleep apnea    No longer on CPAP following weight loss   Tinnitus    Past Surgical History:  Procedure Laterality Date   BACK SURGERY  2014   herniated L1, L2   Dr Channing Mutters   BIOPSY  09/18/2023   Procedure: BIOPSY;  Surgeon: Corbin Ade, MD;  Location: [facility];  Service: Endoscopy;;   BRAIN SURGERY     CEREBRAL EMBOLIZATION  12/2011   "radiation therapy-did not work"   CHOLECYSTECTOMY  6/98   COLONOSCOPY  09/2009   Dr. Cleotis Nipper: normal, internal hemorrhoids    COLONOSCOPY N/A 02/28/2017   Dr. Jena Gauss: Hemorrhoids, grade  3, mild diverticulosis.   CRANIECTOMY FOR EXCISION OF ACOUSTIC NEUROMA  3/95   ESOPHAGOGASTRODUODENOSCOPY (EGD) WITH PROPOFOL N/A 09/18/2023   Procedure: ESOPHAGOGASTRODUODENOSCOPY (EGD) WITH PROPOFOL;  Surgeon: Corbin Ade, MD;  Location: [facility];  Service: Endoscopy;  Laterality: N/A;  930am, asa 3   MINOR AMPUTATION OF DIGIT Left 12/31/2018   Procedure: REVISION AMPUTATION OF LEFT INDEX FINGER, IRRIGATION AND DEBRIDEMENT LEFT INDEX FINGER;  Surgeon: Ernest Mallick, MD;  Location: [facility] OR;  Service: Orthopedics;  Laterality: Left;   POLYPECTOMY  09/18/2023   Procedure: POLYPECTOMY;  Surgeon: Corbin Ade, MD;  Location: [facility];  Service: Endoscopy;;   RADIOLOGY WITH ANESTHESIA N/A 07/07/2014   Procedure: EMBOLIZATION;  Surgeon: Oneal Grout, MD;  Location: [facility] OR;  Service: Radiology;  Laterality: N/A;   TOTAL KNEE ARTHROPLASTY Left 07/06/2013   Procedure: LEFT TOTAL KNEE ARTHROPLASTY;  Surgeon: Loanne Drilling, MD;  Location: [facility];  Service: Orthopedics;  Laterality: Left;   TOTAL KNEE ARTHROPLASTY Right 01/18/2014   Procedure: RIGHT TOTAL KNEE ARTHROPLASTY;  Surgeon: Loanne Drilling, MD;  Location: [facility];  Service: Orthopedics;  Laterality: Right;   TRIGGER FINGER RELEASE  2003   (thumb) middle finger (2006)   Social History   Socioeconomic History   Marital status: Married  Spouse name: Not on file   Number of children: Not on file   Years of education: Not on file   Highest education level: Not on file  Occupational History   Not on file  Tobacco Use   Smoking status: Former    Current packs/day: 0.00    Average packs/day: 1.5 packs/day for 30.0 years (45.0 ttl pk-yrs)    Types: Cigarettes    Start date: 04/24/1957    Quit date: 10/15/1986    Years since quitting: 37.1    Passive exposure: Never   Smokeless tobacco: Never  Vaping Use   Vaping status: Never Used  Substance and Sexual Activity   Alcohol use: No    Alcohol/week: 0.0 standard  drinks of alcohol    Comment: Used to drink heavily at times   Drug use: No   Sexual activity: Not Currently  Other Topics Concern   Not on file  Social History Narrative   Co-dependent relationship with his 13 year old son who has drug and financial problems   Recently married to [place] on 01/02/2014   Social Drivers of Health   Financial Resource Strain: Low Risk  (12/05/2023)   Received [facility]   Overall Financial Resource Strain (CARDIA)    Difficulty of Paying Living Expenses: Not hard at all  Food Insecurity: No Food Insecurity (12/11/2023)   Hunger Vital Sign    Worried About Running Out of Food in the Last Year: Never true    Ran Out of Food in the Last Year: Never true  Transportation Needs: No Transportation Needs (12/11/2023)   PRAPARE - Administrator, Civil Service (Medical): No    Lack of Transportation (Non-Medical): No  Physical Activity: Inactive (12/05/2023)   Received [facility]   Exercise Vital Sign    Days of Exercise per Week: 0 days    Minutes of Exercise per Session: 10 min  Stress: No Stress Concern Present (12/05/2023)   Received from [facility] - Occupational Stress Questionnaire    Feeling of Stress : Only a little  Social Connections: Socially Integrated (12/11/2023)   Social Connection and Isolation Panel [NHANES]    Frequency of Communication with Friends and Family: More than three times a week    Frequency of Social Gatherings with Friends and Family: More than three times a week    Attends Religious Services: More than 4 times per year    Active Member of [employer] or Organizations: Yes    Attends Engineer, structural: More than 4 times per year    Marital Status: Married   Family History  Problem Relation Age of Onset   Cancer Father        oral cancer   Breast cancer Mother    Heart attack Mother    Hypertension Sister        Bypass x4   Alcohol abuse  Sister    Alzheimer's disease Sister    Kidney disease Sister    Colon cancer Neg Hx    Allergies  Allergen Reactions   Latex Itching   Bactrim [Sulfamethoxazole-Trimethoprim] Other (See Comments)    Unknown reaction   Prednisone Other (See Comments)    Pt is high functioning and out of sorts.   Codeine Other (See Comments)    Grogginess/drowsiness    Tape Other (See Comments)    Skin tears, use paper tape   Prior to Admission medications  Medication Sig Start Date End Date Taking? Authorizing Provider  acetaminophen (TYLENOL) 325 MG tablet Take 325-650 mg by mouth 2 (two) times daily as needed for moderate pain (pain score 4-6), fever or headache.   Yes [provider]  azelastine (ASTELIN) 0.1 % nasal spray 1-2 puffs each nostril every 8 hours if needed for drainage 12/17/17  Yes Young, Joni Fears D, MD  Cholecalciferol (VITAMIN D-3 PO) Take 1 tablet by mouth daily with lunch.   Yes [provider]  esomeprazole (NEXIUM) 40 MG capsule Take 1 capsule (40 mg total) by mouth 2 (two) times daily before a meal. 11/19/23  Yes Tiffany Kocher, PA-C  hydrALAZINE (APRESOLINE) 50 MG tablet TAKE 1 & 1/2 (ONE & ONE-HALF) TABLETS BY MOUTH THREE TIMES DAILY 06/24/23  Yes Jonelle Sidle, MD  latanoprost (XALATAN) 0.005 % ophthalmic solution Place 1 drop into both eyes at bedtime.  09/01/12  Yes [provider]  LORazepam (ATIVAN) 1 MG tablet TAKE 1 TABLET BY MOUTH THREE TIMES DAILY AS NEEDED FOR ANXIETY Patient taking differently: Take 1 mg by mouth See admin instructions. Take 1 tablet (1mg ) by mouth nightly at bedtime for sleep. May take an additional 1 tablet twice daily as needed for anxiety. 06/24/23  Yes Young, Joni Fears D, MD  losartan (COZAAR) 100 MG tablet Take 100 mg by mouth daily.   Yes [provider]  memantine (NAMENDA) 5 MG tablet Take 5 mg by mouth 2 (two) times daily. 10/26/23  Yes [provider]  Multiple Vitamins-Minerals (CENTRUM SILVER  50+MEN) TABS Take 1 tablet by mouth daily.   Yes [provider]  Multiple Vitamins-Minerals (ZINC PO) Take 1 tablet by mouth daily with lunch.   Yes [provider]  nitrofurantoin (MACRODANTIN) 50 MG capsule Take 50 mg by mouth at bedtime.   Yes [provider]  Polyethyl Glycol-Propyl Glycol (SYSTANE OP) Place 1 drop into both eyes 2 (two) times daily as needed (dry eyes).   Yes [provider]  polyethylene glycol (MIRALAX / GLYCOLAX) packet Take 17 g by mouth at bedtime.   Yes [provider]  potassium chloride SA (KLOR-CON M) 20 MEQ tablet Take 1 tablet (20 mEq total) by mouth daily. 05/08/23  Yes Jonelle Sidle, MD  pravastatin (PRAVACHOL) 40 MG tablet Take 1 tablet by mouth at bedtime.  10/05/14  Yes [provider]  PROAIR HFA 108 (90 Base) MCG/ACT inhaler INHALE 2 PUFFS BY MOUTH EVERY 6 HOURS AS NEEDED FOR WHEEZING FOR SHORTNESS OF BREATH 03/18/20  Yes Young, Clinton D, MD  silodosin (RAPAFLO) 8 MG CAPS capsule Take 1 capsule (8 mg total) by mouth at bedtime. 06/26/23  Yes McKenzie, Mardene Celeste, MD  sodium chloride 1 g tablet Take 1 g by mouth 3 (three) times daily. 02/08/21  Yes [provider]  spironolactone (ALDACTONE) 25 MG tablet Take 0.5 tablets (12.5 mg total) by mouth daily. 11/21/23  Yes Jonelle Sidle, MD  SYMBICORT 160-4.5 MCG/ACT inhaler INHALE 2 PUFFS BY MOUTH  TWICE DAILY THEN  RINSE  MOUTH Patient taking differently: Inhale 2 puffs into the lungs 2 (two) times daily as needed (shortness of breath, wheezing). INHALE 2 PUFFS BY MOUTH  TWICE DAILY THEN  RINSE  MOUTH 01/31/21  Yes Young, Clinton D, MD  Wheat Dextrin (BENEFIBER PO) Take 1 Scoop by mouth 2 (two) times daily.   Yes [provider]  zolpidem (AMBIEN) 10 MG tablet Take 10 mg by mouth at bedtime. 01/24/16  Yes [provider]   DG Ankle Complete Left Result Date: 12/13/2023 CLINICAL DATA:  Recent fall with left ankle pain, initial  encounter EXAM: LEFT ANKLE COMPLETE - 3+ VIEW COMPARISON:  None Available. FINDINGS: Minimally displaced oblique fracture of the distal fibular metaphysis is seen. Irregularity adjacent to the medial malleolus is noted suspicious for avulsion. No other focal fracture is seen. Mild lateral soft tissue swelling is seen. Degenerative changes of the tarsal bones are noted. IMPRESSION: Oblique fibular fracture as described. Findings suspicious for avulsion from the tip of the medial malleolus. Electronically Signed   By: Alcide Clever M.D.   On: 12/13/2023 20:23    Positive ROS: All other systems have been reviewed and were otherwise negative with the exception of those mentioned in the HPI and as above.  Physical Exam: General: Alert, no acute distress Cardiovascular: Mild pitting pedal edema in LE bilaterally.  Respiratory: No cyanosis, no use of accessory musculature GI: No organomegaly, abdomen is soft and non-tender Skin: No lesions in the area of chief complaint Neurologic: Sensation intact distally Psychiatric: Patient is competent for consent with normal mood and affect Lymphatic: No axillary or cervical lymphadenopathy  MUSCULOSKELETAL:   Examination of the right ankle reveals no skin wounds or lesions. Pain with palpation over the distal lateral malleolus with light touch only. Positive compression test. He reports pain with gentle passive and active movement of his right ankle. Distal pedal pulses 2+ bilaterally. Calves soft and non-tender. Capillary refill < 2 seconds. Mild pitting pedal edema in LE bilaterally.   Stocking glove changes noted on RLE. Sensory and motor function otherwise intact in LE bilaterally including, plantar flexion, dorsiflexion, and EHL.   Assessment: Acute closed mildly displaced oblique distal fibular fracture.  NSTEMI, plavix and aspirin.   Plan: I discussed the findings with the daughter and the patient. Acute closed mildly displaced oblique distal fibular  fracture. He also has a positive compression test on the left leg. X-ray left tib/fib, and CT scan of his left ankle/tibia ordered. Will change from CAM boot to posterior and short leg splint. Patient to remain NWB LLE with walker. Patient to follow-up with Dr. Victorino Dike in 5-7 days for repeat evaluation.    Clois Dupes, PA-C    12/14/2023 2:23 PM

## 2023-12-14 NOTE — Progress Notes (Addendum)
 [facility]                                                                              Julian Fowler, is a 89 y.o. male, DOB - 04/17/1935, JXB:[id number] Admit date - 12/11/2023    Outpatient Primary MD for the patient is [name], [name], MD  LOS - 3  days  No chief complaint on file.      Brief summary   Patient is a 89 year old male with HTN, hyperlipidemia, GERD, A-fib, cerebrovascular AV fistula, acoustic neuroma, BPH, asthma anxiety, ILD, asbestosis, OSA presented from [facility] with NSTEMI.    Patient initially presented dated on 2/19 with hypertensive emergency, dizziness, fogginess.  He was treated with nitroglycerin infusion and weaned off to p.o. medications.    He was also found to have UTI, placed on IV Rocephin, developed delirium and agitation secondary to sundowning.  He had a negative MRI. On 2/25, patient had a significant crushing chest painEKG showed evidence of ST depression diffusely and elevated troponin to 1819.  He was treated with aspirin, nitroglycerin, Lovenox and metoprolol.  He was transferred to [facility] for NSTEMI evaluation, management and cardiac cath.  Cardiology was consulted.   Troponin noted to be continuing to trend up, here 7237.   Assessment & Plan    Principal Problem: NSTEMI (non-ST elevated myocardial infarction) (HCC) -Currently no chest pain.  Initially admitted at [facility] with hypertensive emergency, treated with IV nitroglycerin. -2D echo 12/05/2023 at [facility] showed LVEF > 55%, mild to moderately increased LV wall thickness, LA moderately dilated, RA and RV mildly dilated. -Developed chest pain on 2/25 with elevated troponins, peaking at 7551, repeat troponin on admission at [facility] 7237 -2D echo showed low normal EF of 50 to 55% with no regional wall motion abnormalities Cardiology following, has completed 48 hours of IV heparin. -Recommended aspirin and Plavix for 3 months then continue aspirin 81 mg  daily -Continue pravastatin, Imdur  Acute metabolic encephalopathy superimposed on underlying ?mild dementia, anxiety -Likely worsened due to UTI, hyponatremia, sodium was 119 at [facility] -Continue IV Rocephin -Currently alert and oriented x 4, wife at the bedside, appears at his baseline. -PT OT evaluation completed 2/28 -Continue Seroquel (will need prescription at discharge)  Fall with left fibular fracture -Patient fell during PT evaluation 2/28, lost balance after tripping over PT's foot -X-ray revealed oblique fibular fracture on left -Orthopedic surgery consulted - Discussed with Dr. Linna Caprice, ordered cam walker boot and patient to follow up in office 5-7 days   UTI -Positive UA on 12/24 and was placed on IV Rocephin -Prior history of recurrent UTIs, urine cultures have shown E. coli in the past -Continue IV Rocephin, will continue for 7 days including the days received at [facility], transition to oral keflex at discharge   Acute on chronic hyponatremia -Na 119 at [facility], has been on salt tablets.  Per family, he continues to do these as he was hospitalized with severe hyponatremia in the past -Sodium improved, stable, monitor  Permanent atrial fibrillation (HCC) -Rate controlled -Not on anticoagulation due to history of falls and skin tears  Asthma, mild intermittent, well-controlled  History of asbestosis, ILD -Currently stable, no wheezing  Hypertension -Patient had originally presented with hypertensive emergency to [facility] with a BP 225/119, was placed on IV nitroglycerin drip -Continue Norvasc, hydralazine, Aldactone, Avapro  GERD -Continue PPI  Hyperlipidemia -Continue statin  OSA (obstructive sleep apnea) Not on CPAP  Obesity class II Estimated body mass index is 35.86 kg/m as calculated from the following:   Height as of this encounter: 5\' 9"  (1.753 m).   Weight as of this encounter: 110.1 kg.  Code Status: Full code DVT Prophylaxis:  IV heparin drip  Level  of Care: Level of care: Progressive Family Communication: Wife at bedside this morning Disposition Plan:      Remains inpatient appropriate: Orthopedic surgery consult for left fibular fracture   Procedures:  2D echo   Consultants:   Cardiology Orthopedic surgery  Antimicrobials:   Anti-infectives (From admission, onward)    Start     Dose/Rate Route Frequency Ordered Stop   12/12/23 1000  cefTRIAXone (ROCEPHIN) 1 g in sodium chloride 0.9 % 100 mL IVPB        1 g 200 mL/hr over 30 Minutes Intravenous Every 24 hours 12/11/23 1455            Medications  amLODipine  10 mg Oral Daily   clopidogrel  75 mg Oral Daily   diclofenac Sodium  2 g Topical QID   guaiFENesin  1,200 mg Oral BID   hydrALAZINE  100 mg Oral TID   irbesartan  150 mg Oral Daily   isosorbide mononitrate  60 mg Oral Daily   mometasone-formoterol  2 puff Inhalation BID   pantoprazole  40 mg Oral Daily   pravastatin  40 mg Oral QHS   QUEtiapine  50 mg Oral QHS   sodium chloride  1 g Oral TID WC   spironolactone  25 mg Oral Daily      Subjective:   Patient complains of pain in his left leg.  Dates that he tripped over physical therapist for yesterday during evaluation, fell down.  No cardiac complaints  Objective:   Vitals:   12/13/23 1644 12/13/23 2000 12/14/23 0445 12/14/23 0912  BP: (!) 135/58 (!) 155/85 (!) 169/90 (!) 155/74  Pulse: 88 92 65   Resp: 17 18 16    Temp: 98.4 F (36.9 C) 99.3 F (37.4 C) 99.4 F (37.4 C)   TempSrc: Oral Oral Oral   SpO2: 98% 97% 94%   Weight:      Height:        Intake/Output Summary (Last 24 hours) at 12/14/2023 1044 Last data filed at 12/14/2023 0900 Gross per 24 hour  Intake 600 ml  Output --  Net 600 ml     Wt Readings from Last 3 Encounters:  12/12/23 110.1 kg  11/19/23 109 kg  11/15/23 109.4 kg   Examination: General exam: Appears calm and comfortable  Respiratory system: Clear to auscultation. Respiratory effort normal. Cardiovascular  system: S1 & S2 heard. No pedal edema. Gastrointestinal system: Abdomen is nondistended, soft and nontender. Normal bowel sounds heard. Central nervous system: Alert and oriented. Non focal exam. Speech clear  Psychiatry: Judgement and insight appear stable. Mood & affect appropriate.     Data Reviewed:  I have personally reviewed following labs    CBC Lab Results  Component Value Date   WBC 12.8 (H) 12/14/2023   RBC 3.97 (L) 12/14/2023   HGB 12.0 (L) 12/14/2023   HCT 34.8 (L) 12/14/2023   MCV 87.7 12/14/2023  MCH 30.2 12/14/2023   PLT 390 12/14/2023   MCHC 34.5 12/14/2023   RDW 13.9 12/14/2023   LYMPHSABS 0.9 12/11/2023   MONOABS 0.8 12/11/2023   EOSABS 1.1 (H) 12/11/2023   BASOSABS 0.1 12/11/2023     Last metabolic panel Lab Results  Component Value Date   NA 128 (L) 12/14/2023   K 4.3 12/14/2023   CL 96 (L) 12/14/2023   CO2 27 12/14/2023   BUN 7 (L) 12/14/2023   CREATININE 0.76 12/14/2023   GLUCOSE 98 12/14/2023   GFRNONAA >60 12/14/2023   GFRAA >60 12/31/2018   CALCIUM 8.3 (L) 12/14/2023   PHOS 3.4 12/14/2023   PROT 5.9 (L) 12/11/2023   ALBUMIN 2.6 (L) 12/14/2023   BILITOT 0.4 12/11/2023   ALKPHOS 72 12/11/2023   AST 100 (H) 12/11/2023   ALT 47 (H) 12/11/2023   ANIONGAP 5 12/14/2023    CBG (last 3)  No results for input(s): "GLUCAP" in the last 72 hours.    Coagulation Profile: No results for input(s): "INR", "PROTIME" in the last 168 hours.   Radiology Studies: I have personally reviewed the imaging studies  DG Ankle Complete Left Result Date: 12/13/2023 CLINICAL DATA:  Recent fall with left ankle pain, initial encounter EXAM: LEFT ANKLE COMPLETE - 3+ VIEW COMPARISON:  None Available. FINDINGS: Minimally displaced oblique fracture of the distal fibular metaphysis is seen. Irregularity adjacent to the medial malleolus is noted suspicious for avulsion. No other focal fracture is seen. Mild lateral soft tissue swelling is seen. Degenerative changes of  the tarsal bones are noted. IMPRESSION: Oblique fibular fracture as described. Findings suspicious for avulsion from the tip of the medial malleolus. Electronically Signed   By: Alcide Clever M.D.   On: 12/13/2023 20:23      Noralee Stain, DO [facility] 12/14/2023, 10:48 AM   Available via Epic secure chat 7am-7pm After these hours, please refer to coverage provider listed on [email or web address]

## 2023-12-14 NOTE — Evaluation (Signed)
 Occupational Therapy Evaluation Patient Details Name: Julian Fowler MRN: [id number] DOB: Mar 17, 1935 Today's Date: 12/14/2023   History of Present Illness   89 y.o. male presents to [facility] hospital on 12/11/2023 from [facility] with NSTEMI and UTI. PMH includes HTN, HLD, afib, acoustic neuroma, BPH, asthma, anxiety, ILD, OSA. Fall on 2/28 with left fibular fracture.     Clinical Impressions Pt admitted with the above diagnoses and presents with below problem list. Pt will benefit from continued acute OT to address the below listed deficits and maximize independence with basic ADLs prior to d/c to next venue. At baseline, pt is mod I with ADLs (SPC). Pt currently needs up to max-total A with LB ADLs and bed mobility. Able to sit EOB a few minutes at CGA level. Needed max-total A +2 to scoot up EOB. Pt with pain and edema in R foot, bruising in R big toe, messaged hospitalist and notified nursing.       If plan is discharge home, recommend the following:         Functional Status Assessment   Patient has had a recent decline in their functional status and demonstrates the ability to make significant improvements in function in a reasonable and predictable amount of time.     Equipment Recommendations   Other (comment) (defer to next venue)     Recommendations for Other Services         Precautions/Restrictions   Precautions Precautions: Fall Recall of Precautions/Restrictions: Impaired Precaution/Restrictions Comments: fall on 2/28 with PT Restrictions Weight Bearing Restrictions Per Provider Order: Yes LLE Weight Bearing Per Provider Order: Touchdown weight bearing Other Position/Activity Restrictions: CAM boot on     Mobility Bed Mobility Overal bed mobility: Needs Assistance Bed Mobility: Supine to Sit, Sit to Supine     Supine to sit: Max assist, +2 for physical assistance, HOB elevated Sit to supine: +2 for physical assistance, Mod assist, +2 for  safety/equipment        Transfers                   General transfer comment: attempted but unable stand +2-3 assist to scoot EOB. (1 person to maintain LLE TDWB)      Balance Overall balance assessment: Needs assistance Sitting-balance support: Feet supported, Single extremity supported, Bilateral upper extremity supported Sitting balance-Leahy Scale: Fair                                     ADL either performed or assessed with clinical judgement   ADL Overall ADL's : Needs assistance/impaired Eating/Feeding: Set up;Bed level   Grooming: Set up   Upper Body Bathing: Moderate assistance;Bed level;Sitting   Lower Body Bathing: +2 for physical assistance;Bed level;Maximal assistance;+2 for safety/equipment   Upper Body Dressing : Moderate assistance   Lower Body Dressing: Total assistance;Maximal assistance;+2 for physical assistance;+2 for safety/equipment;Bed level                 General ADL Comments: Able to sit EOB a few minutes at CGA level. Unable to stand or scoot. Max-total+2 for bed mobility and scooting EOB     Vision         Perception         Praxis         Pertinent Vitals/Pain Pain Assessment Pain Assessment: Faces Faces Pain Scale: Hurts little more Pain Location: R foot sitting EOB, LLE Pain Descriptors /  Indicators: Discomfort, Grimacing, Sore Pain Intervention(s): Limited activity within patient's tolerance, Monitored during session, Repositioned, Ice applied     Extremity/Trunk Assessment Upper Extremity Assessment Upper Extremity Assessment: Generalized weakness   Lower Extremity Assessment Lower Extremity Assessment: Defer to PT evaluation       Communication Communication Communication: Impaired Factors Affecting Communication: Hearing impaired   Cognition Arousal: Alert Behavior During Therapy: WFL for tasks assessed/performed Cognition: No apparent impairments                                Following commands: Intact       Cueing  General Comments   Cueing Techniques: Verbal cues;Gestural cues      Exercises     Shoulder Instructions      Home Living Family/patient expects to be discharged to:: Private residence Living Arrangements: Spouse/significant other Available Help at Discharge: Family;Available 24 hours/day Type of Home: House Home Access: Stairs to enter [employer] of Steps: 1 Entrance Stairs-Rails: None Home Layout: Laundry or work area in basement (stair lift to basement)     Bathroom Shower/Tub: Producer, television/film/video: Handicapped height Bathroom Accessibility: Yes   Home Equipment: Agricultural consultant (2 wheels);Cane - single point;Rollator (4 wheels);Shower seat - built in;Grab bars - toilet;Grab bars - tub/shower          Prior Functioning/Environment Prior Level of Function : Independent/Modified Independent             Mobility Comments: ambulatory with SPC      OT Problem List: Decreased strength;Decreased activity tolerance;Impaired balance (sitting and/or standing);Decreased knowledge of use of DME or AE;Decreased knowledge of precautions;Pain   OT Treatment/Interventions: Self-care/ADL training;Therapeutic exercise;DME and/or AE instruction;Therapeutic activities;Balance training;Patient/family education      OT Goals(Current goals can be found in the care plan section)   Acute Rehab OT Goals Patient Stated Goal: get out of bed, up to [facility] for bowel needs OT Goal Formulation: With patient/family Time For Goal Achievement: 12/28/23 Potential to Achieve Goals: Good   OT Frequency:  Min 2X/week    Co-evaluation PT/OT/SLP Co-Evaluation/Treatment: Yes Reason for Co-Treatment: Complexity of the patient's impairments (multi-system involvement);For patient/therapist safety;To address functional/ADL transfers   OT goals addressed during session: ADL's and self-care;Strengthening/ROM       AM-PAC OT "6 Clicks" Daily Activity     Outcome Measure Help from another person eating meals?: None Help from another person taking care of personal grooming?: A Little Help from another person toileting, which includes using toliet, bedpan, or urinal?: Total Help from another person bathing (including washing, rinsing, drying)?: Total Help from another person to put on and taking off regular upper body clothing?: A Lot Help from another person to put on and taking off regular lower body clothing?: Total 6 Click Score: 12   End of Session Nurse Communication: Mobility status;Other (comment) (R foot pain, OT reaching out to MD about considering imaging.)  Activity Tolerance: Patient limited by pain;Patient limited by fatigue Patient left: in bed;with call bell/phone within reach;with bed alarm set;with family/visitor present  OT Visit Diagnosis: Other abnormalities of gait and mobility (R26.89);Pain;Muscle weakness (generalized) (M62.81)                Time: 2725-3664 OT Time Calculation (min): 49 min Charges:  OT General Charges $OT Visit: 1 Visit OT Evaluation $OT Eval Moderate Complexity: 1 Mod OT Treatments $Self Care/Home Management : 8-22 mins  Raynald Kemp, OT Acute  Rehabilitation Services Office: [phone]   Pilar Grammes 12/14/2023, 4:16 PM

## 2023-12-14 NOTE — Progress Notes (Signed)
 PT Cancellation Note  Patient Details Name: Julian Fowler MRN: [id number] DOB: 1935/07/14   Cancelled Treatment:    Reason Eval/Treat Not Completed: (P) Other (comment). Imaging since last session now demonstrates a L distal fibular fx. Will await ortho recommendations and follow-up as able.   Virgil Benedict, PT, DPT Acute Rehabilitation Services  Office: [phone]    [name] 12/14/2023, 7:48 AM

## 2023-12-14 NOTE — Progress Notes (Signed)
 Orthopedic Tech Progress Note Patient Details:  Julian Fowler 09-02-1935 [id number]  Ortho Devices Type of Ortho Device: CAM walker Ortho Device/Splint Location: LLE Ortho Device/Splint Interventions: Ordered, Application, Adjustment   Post Interventions Patient Tolerated: Well Instructions Provided: Care of device, Adjustment of device  [name] 12/14/2023, 2:34 PM

## 2023-12-15 ENCOUNTER — Inpatient Hospital Stay (HOSPITAL_COMMUNITY)

## 2023-12-15 DIAGNOSIS — F419 Anxiety disorder, unspecified: Secondary | ICD-10-CM | POA: Diagnosis not present

## 2023-12-15 DIAGNOSIS — I214 Non-ST elevation (NSTEMI) myocardial infarction: Secondary | ICD-10-CM | POA: Diagnosis not present

## 2023-12-15 DIAGNOSIS — J4520 Mild intermittent asthma, uncomplicated: Secondary | ICD-10-CM | POA: Diagnosis not present

## 2023-12-15 DIAGNOSIS — I4821 Permanent atrial fibrillation: Secondary | ICD-10-CM | POA: Diagnosis not present

## 2023-12-15 LAB — RENAL FUNCTION PANEL
Albumin: 2.3 g/dL — ABNORMAL LOW (ref 3.5–5.0)
Anion gap: 11 (ref 5–15)
BUN: 11 mg/dL (ref 8–23)
CO2: 23 mmol/L (ref 22–32)
Calcium: 8.3 mg/dL — ABNORMAL LOW (ref 8.9–10.3)
Chloride: 94 mmol/L — ABNORMAL LOW (ref 98–111)
Creatinine, Ser: 0.85 mg/dL (ref 0.61–1.24)
GFR, Estimated: >60 mL/min (ref >60)
Glucose, Bld: 95 mg/dL (ref 70–99)
Phosphorus: 3.5 mg/dL (ref 2.5–4.6)
Potassium: 3.7 mmol/L (ref 3.5–5.1)
Sodium: 128 mmol/L — ABNORMAL LOW (ref 135–145)

## 2023-12-15 LAB — CBC
HCT: 32.7 % — ABNORMAL LOW (ref 39.0–52.0)
Hemoglobin: 11.2 g/dL — ABNORMAL LOW (ref 13.0–17.0)
MCH: 29.9 pg (ref 26.0–34.0)
MCHC: 34.3 g/dL (ref 30.0–36.0)
MCV: 87.2 fL (ref 80.0–100.0)
Platelets: 351 10*3/uL (ref 150–400)
RBC: 3.75 MIL/uL — ABNORMAL LOW (ref 4.22–5.81)
RDW: 13.7 % (ref 11.5–15.5)
WBC: 13.5 10*3/uL — ABNORMAL HIGH (ref 4.0–10.5)
nRBC: 0 % (ref 0.0–0.2)

## 2023-12-15 NOTE — Progress Notes (Signed)
 Orthopedic Tech Progress Note Patient Details:  Julian Fowler 28-Jul-1935 [id number]  Pt was transitioned from the CAM boot to a well-padded plaster posterior short leg splint with a stirrup. The CAM boot remains with his pt belongings.  Ortho Devices Type of Ortho Device: Post (short leg) splint, Stirrup splint Ortho Device/Splint Location: LLE Ortho Device/Splint Interventions: Ordered, Application, Adjustment   Post Interventions Patient Tolerated: Well Instructions Provided: Care of device, Adjustment of device  Loye Vento Carmine Savoy 12/15/2023, 9:53 AM

## 2023-12-15 NOTE — Progress Notes (Signed)

## 2023-12-15 NOTE — Progress Notes (Signed)
 [facility]                                                                              Marvion Bastidas, is a 89 y.o. male, DOB - 1935-07-19, OEV:[id number] Admit date - 12/11/2023    Outpatient Primary MD for the patient is [name], [name], MD  LOS - 4  days  No chief complaint on file.      Brief summary   Patient is a 89 year old male with HTN, hyperlipidemia, GERD, A-fib, cerebrovascular AV fistula, acoustic neuroma, BPH, asthma anxiety, ILD, asbestosis, OSA presented from [facility] with NSTEMI.    Patient initially presented dated on 2/19 with hypertensive emergency, dizziness, fogginess.  He was treated with nitroglycerin infusion and weaned off to p.o. medications.    He was also found to have UTI, placed on IV Rocephin, developed delirium and agitation secondary to sundowning.  He had a negative MRI. On 2/25, patient had a significant crushing chest painEKG showed evidence of ST depression diffusely and elevated troponin to 1819.  He was treated with aspirin, nitroglycerin, Lovenox and metoprolol.  He was transferred to [facility] for NSTEMI evaluation, management and cardiac cath.  Cardiology was consulted.   Troponin noted to be continuing to trend up, here 7237.   Assessment & Plan    Principal Problem:   NSTEMI (non-ST elevated myocardial infarction) (HCC) -Currently no chest pain.  Initially admitted at [facility] with hypertensive emergency, treated with IV nitroglycerin. -2D echo 12/05/2023 at [facility] showed LVEF > 55%, mild to moderately increased LV wall thickness, LA moderately dilated, RA and RV mildly dilated. -Developed chest pain on 2/25 with elevated troponins, peaking at 7551, repeat troponin on admission at [facility] 7237 -2D echo showed low normal EF of 50 to 55% with no regional wall motion abnormalities -Evaluated by cardiology, completed IV heparin for 48 hours.  . - Recommended aspirin and Plavix for 3 months then continue aspirin 81 mg  daily -Continue pravastatin, Imdur increased to 60 mg daily   Acute metabolic encephalopathy superimposed on underlying ?mild dementia, anxiety -Likely worsened due to UTI, hyponatremia, sodium was 119 at [facility] -Continue IV Rocephin -Currently alert and oriented, appears at baseline. -Continue Seroquel (will need prescription at discharge)  Fall with left fibular fracture -Patient fell during PT evaluation on 2/28, lost balance after tripping over PT's foot -X-ray revealed oblique fibular fracture on left -Orthopedic surgery consulted Dr. Linna Caprice, ordered cam walker boot and patient to follow up in office 5-7 days -> transition to short leg splint with stirrup -Patient now complaining of also of right foot pain, unable to bear weight, right foot x-ray was negative on 3/1 -Per Ortho, has CT left ankle ordered, will also order CT right foot  UTI -Positive UA on 12/24 and was placed on IV Rocephin -Prior history of recurrent UTIs, urine cultures have shown E. coli in the past -Continue IV Rocephin x 7 days including the days received at [facility]  Acute on chronic hyponatremia - Na 119 at [facility], has been on salt tablets.  Per family, he continues to do these as he was hospitalized with severe hyponatremia in  the past -Na down to 128, continue salt tabs    Permanent atrial fibrillation (HCC) -Rate controlled -Not on anticoagulation due to history of falls and skin tears    Asthma, mild intermittent, well-controlled History of asbestosis, ILD -Currently stable, no wheezing  Hypertension -Patient had originally presented with hypertensive emergency to [facility] with a BP 225/119, was placed on IV nitroglycerin drip -Continue amlodipine, hydralazine, Imdur, irbesartan, spironolactone    GERD -Continue PPI     Hyperlipidemia -Continue statin     OSA (obstructive sleep apnea) Not on CPAP  Obesity class II Estimated body mass index is 35.86 kg/m as calculated from the following:    Height as of this encounter: 5\' 9"  (1.753 m).   Weight as of this encounter: 110.1 kg.  Code Status: Full code DVT Prophylaxis:     Level of Care: Level of care: Progressive Family Communication: Updated patient's daughter at the bedside Disposition Plan:      Remains inpatient appropriate:     Procedures:  2D echo   Consultants:   Cardiology  Antimicrobials:   Anti-infectives (From admission, onward)    Start     Dose/Rate Route Frequency Ordered Stop   12/12/23 1000  cefTRIAXone (ROCEPHIN) 1 g in sodium chloride 0.9 % 100 mL IVPB        1 g 200 mL/hr over 30 Minutes Intravenous Every 24 hours 12/11/23 1455            Medications  amLODipine  10 mg Oral Daily   aspirin  81 mg Oral Daily   clopidogrel  75 mg Oral Daily   diclofenac Sodium  2 g Topical QID   guaiFENesin  1,200 mg Oral BID   hydrALAZINE  100 mg Oral TID   irbesartan  150 mg Oral Daily   isosorbide mononitrate  60 mg Oral Daily   mometasone-formoterol  2 puff Inhalation BID   pantoprazole  40 mg Oral Daily   pravastatin  40 mg Oral QHS   QUEtiapine  50 mg Oral QHS   sodium chloride  1 g Oral TID WC   spironolactone  25 mg Oral Daily      Subjective:   Quintan Saldivar was seen and examined today.  Right foot and great toe tender, states unable to bear weight.  Left foot in the cam boot.  Daughter at the bedside.  Objective:   Vitals:   12/15/23 0427 12/15/23 0753 12/15/23 0806 12/15/23 1157  BP: 130/75 (!) 133/58  113/66  Pulse: 85   85  Resp: 16 16  18   Temp: 98.5 F (36.9 C) 98.2 F (36.8 C)  98.2 F (36.8 C)  TempSrc: Oral Oral  Oral  SpO2: 95%  96% 96%  Weight:      Height:        Intake/Output Summary (Last 24 hours) at 12/15/2023 1231 Last data filed at 12/15/2023 1007 Gross per 24 hour  Intake 630 ml  Output 650 ml  Net -20 ml     Wt Readings from Last 3 Encounters:  12/12/23 110.1 kg  11/19/23 109 kg  11/15/23 109.4 kg    Physical Exam General: Alert and oriented  x 3, NAD Cardiovascular: S1 S2 clear, RRR.  Respiratory: CTAB, no wheezing Gastrointestinal: Soft, nontender, nondistended, NBS Ext: left foot in cam boot, right foot somewhat swollen, great toe bluish and tender Neuro: no new deficits Psych: Normal affect        Data Reviewed:  I have personally reviewed following labs  CBC Lab Results  Component Value Date   WBC 13.5 (H) 12/15/2023   RBC 3.75 (L) 12/15/2023   HGB 11.2 (L) 12/15/2023   HCT 32.7 (L) 12/15/2023   MCV 87.2 12/15/2023   MCH 29.9 12/15/2023   PLT 351 12/15/2023   MCHC 34.3 12/15/2023   RDW 13.7 12/15/2023   LYMPHSABS 0.9 12/11/2023   MONOABS 0.8 12/11/2023   EOSABS 1.1 (H) 12/11/2023   BASOSABS 0.1 12/11/2023     Last metabolic panel Lab Results  Component Value Date   NA 128 (L) 12/15/2023   K 3.7 12/15/2023   CL 94 (L) 12/15/2023   CO2 23 12/15/2023   BUN 11 12/15/2023   CREATININE 0.85 12/15/2023   GLUCOSE 95 12/15/2023   GFRNONAA >60 12/15/2023   GFRAA >60 12/31/2018   CALCIUM 8.3 (L) 12/15/2023   PHOS 3.5 12/15/2023   PROT 5.9 (L) 12/11/2023   ALBUMIN 2.3 (L) 12/15/2023   BILITOT 0.4 12/11/2023   ALKPHOS 72 12/11/2023   AST 100 (H) 12/11/2023   ALT 47 (H) 12/11/2023   ANIONGAP 11 12/15/2023    CBG (last 3)  No results for input(s): "GLUCAP" in the last 72 hours.    Coagulation Profile: No results for input(s): "INR", "PROTIME" in the last 168 hours.   Radiology Studies: I have personally reviewed the imaging studies  DG Foot 2 Views Right Result Date: 12/14/2023 CLINICAL DATA:  Right foot pain. EXAM: RIGHT FOOT - 2 VIEW COMPARISON:  07/17/2019 FINDINGS: No acute fracture. Remote healed fifth metatarsal fracture unchanged from prior. Moderate to advanced arthropathy of the first metatarsal phalangeal joint, stable from prior. Hammertoe deformity of the toes. Midfoot degenerative spurring. Plantar calcaneal spur and Achilles tendon enthesophyte. No erosions. There is dorsal soft  tissue edema. IMPRESSION: 1. Dorsal soft tissue edema. 2. No acute fracture or subluxation. 3. Moderate to advanced arthropathy of the first metatarsophalangeal joint, stable from prior. 4. Hammertoe deformity of the toes. Electronically Signed   By: Narda Rutherford M.D.   On: 12/14/2023 20:15   DG Tibia/Fibula Left Result Date: 12/14/2023 CLINICAL DATA:  Closed fracture of left distal fibula.  Swelling. EXAM: LEFT TIBIA AND FIBULA - 2 VIEW COMPARISON:  Ankle radiograph yesterday FINDINGS: Mildly displaced distal fibular fracture, proximal to the ankle mortise. No significant change in alignment from yesterday's ankle exam. The proximal fibula is intact. There is no tibial fracture. Mild tibial talar degenerative spurring. Knee arthroplasty is intact. Generalized subcutaneous edema. IMPRESSION: 1. Mildly displaced distal fibular fracture. No significant change in alignment from yesterday's ankle exam. 2. No additional fracture of the lower leg. Electronically Signed   By: Narda Rutherford M.D.   On: 12/14/2023 20:13   DG Ankle Complete Left Result Date: 12/13/2023 CLINICAL DATA:  Recent fall with left ankle pain, initial encounter EXAM: LEFT ANKLE COMPLETE - 3+ VIEW COMPARISON:  None Available. FINDINGS: Minimally displaced oblique fracture of the distal fibular metaphysis is seen. Irregularity adjacent to the medial malleolus is noted suspicious for avulsion. No other focal fracture is seen. Mild lateral soft tissue swelling is seen. Degenerative changes of the tarsal bones are noted. IMPRESSION: Oblique fibular fracture as described. Findings suspicious for avulsion from the tip of the medial malleolus. Electronically Signed   By: Alcide Clever M.D.   On: 12/13/2023 20:23       Ladarren Steiner M.D. [facility] 12/15/2023, 12:31 PM  Available via Epic secure chat 7am-7pm After 7 pm, please refer to night coverage provider listed on amion.

## 2023-12-15 NOTE — Progress Notes (Addendum)
 PT Cancellation Note  Patient Details Name: Julian Fowler MRN: [id number] DOB: 1935/10/14   Cancelled Treatment:    Reason Eval/Treat Not Completed: Patient at procedure or test/unavailable. PT session attempted. MD arrived followed by tech to apply short leg splint LLE. After splint application, pt scheduled to undergo CT L ankle and R foot. Will await CT results and ortho recs. PT to follow up tomorrow.   Ilda Foil 12/15/2023, 9:36 AM

## 2023-12-16 DIAGNOSIS — F419 Anxiety disorder, unspecified: Secondary | ICD-10-CM | POA: Diagnosis not present

## 2023-12-16 DIAGNOSIS — N3000 Acute cystitis without hematuria: Secondary | ICD-10-CM

## 2023-12-16 DIAGNOSIS — I214 Non-ST elevation (NSTEMI) myocardial infarction: Secondary | ICD-10-CM | POA: Diagnosis not present

## 2023-12-16 DIAGNOSIS — E871 Hypo-osmolality and hyponatremia: Secondary | ICD-10-CM

## 2023-12-16 DIAGNOSIS — I4821 Permanent atrial fibrillation: Secondary | ICD-10-CM | POA: Diagnosis not present

## 2023-12-16 DIAGNOSIS — J61 Pneumoconiosis due to asbestos and other mineral fibers: Secondary | ICD-10-CM | POA: Diagnosis not present

## 2023-12-16 LAB — CBC
HCT: 33.8 % — ABNORMAL LOW (ref 39.0–52.0)
Hemoglobin: 11.6 g/dL — ABNORMAL LOW (ref 13.0–17.0)
MCH: 30.5 pg (ref 26.0–34.0)
MCHC: 34.3 g/dL (ref 30.0–36.0)
MCV: 88.9 fL (ref 80.0–100.0)
Platelets: 310 10*3/uL (ref 150–400)
RBC: 3.8 MIL/uL — ABNORMAL LOW (ref 4.22–5.81)
RDW: 14 % (ref 11.5–15.5)
WBC: 13.2 10*3/uL — ABNORMAL HIGH (ref 4.0–10.5)
nRBC: 0 % (ref 0.0–0.2)

## 2023-12-16 LAB — RENAL FUNCTION PANEL
Albumin: 2.4 g/dL — ABNORMAL LOW (ref 3.5–5.0)
Anion gap: 10 (ref 5–15)
BUN: 13 mg/dL (ref 8–23)
CO2: 22 mmol/L (ref 22–32)
Calcium: 8.4 mg/dL — ABNORMAL LOW (ref 8.9–10.3)
Chloride: 95 mmol/L — ABNORMAL LOW (ref 98–111)
Creatinine, Ser: 0.77 mg/dL (ref 0.61–1.24)
GFR, Estimated: >60 mL/min (ref >60)
Glucose, Bld: 94 mg/dL (ref 70–99)
Phosphorus: 3.7 mg/dL (ref 2.5–4.6)
Potassium: 3.7 mmol/L (ref 3.5–5.1)
Sodium: 127 mmol/L — ABNORMAL LOW (ref 135–145)

## 2023-12-16 MED ORDER — SODIUM CHLORIDE 0.9 % IV SOLN
1.0000 g | INTRAVENOUS | Status: DC
  Administered 2023-12-16: 1 g via INTRAVENOUS

## 2023-12-16 NOTE — Progress Notes (Signed)
 Pt said he doesn't want any medications at this time.

## 2023-12-16 NOTE — Progress Notes (Signed)
 [facility]                                                                              Shawon Denzer, is a 89 y.o. male, DOB - 1935/05/17, ZOX:[id number] Admit date - 12/11/2023    Outpatient Primary MD for the patient is [name], [name], MD  LOS - 5  days  No chief complaint on file.      Brief summary  Patient is a 89 year old male with HTN, hyperlipidemia, GERD, A-fib, cerebrovascular AV fistula, acoustic neuroma, BPH, asthma anxiety, ILD, asbestosis, OSA presented from [facility] with NSTEMI.    Patient initially presented dated on 2/19 with hypertensive emergency, dizziness, fogginess.  He was treated with nitroglycerin infusion and weaned off to p.o. medications.    He was also found to have UTI, placed on IV Rocephin, developed delirium and agitation secondary to sundowning.  He had a negative MRI. On 2/25, patient had a significant crushing chest painEKG showed evidence of ST depression diffusely and elevated troponin to 1819.  He was treated with aspirin, nitroglycerin, Lovenox and metoprolol.  He was transferred to [facility] for NSTEMI evaluation, management and cardiac cath.  Cardiology was consulted.  Troponin noted to be continuing to trend up, here 7237. He was cleared for discharge by cardiology but fell and had left trimalleolar ankle fracture.  Ortho consulted and recommended CAM Walker boot and short leg splint, NWB B on LLE, walker and follow-up in 5 to 7 days.   Assessment & Plan  Principal Problem: NSTEMI: Initially admitted at [facility] for HTN emergency and treated with IV nitro.  Troponin peaked at 7551. TTE with LVEF of 50 to 55% and no RWMA.  Evaluated by cardiology. -Cardiology recommended Plavix monotherapy at least for 1 months (peripheral for 3 months) followed by low-dose aspirin -Continue Imdur and statin.  Acute metabolic encephalopathy superimposed on underlying ?mild dementia, anxiety: Could be due to UTI and hyponatremia.  Seems to  have resolved. -Likely worsened due to UTI, hyponatremia, sodium was 119 at [facility] -Continue Seroquel (will need prescription at discharge)  Fall with left trimalleolar ankle fracture: Patient failed working with PT on 2/28.  X-ray revealed oblique fibular fracture on the left.  CT shows trimalleolar ankle fracture.  CT right foot with soft tissue swelling but no fracture. -Ortho recommends  CAM Walker boot and short leg splint, NWB B on LLE, walker and follow-up in 5 to 7 days. -Pain control. -PT/OT-recommended CIR.  CIR following. -Continue leg elevation.  UTI-UA positive on 12/24 at [facility].  -Ceftriaxone 2/24-3/3 -Positive UA on 12/24 and was placed on IV Rocephin -Prior history of recurrent UTIs, urine cultures have shown E. coli in the past -Continue IV Rocephin x 7 days including the days received at [facility]  Acute on chronic hyponatremia:  Na 119 at [facility], has been on salt tablets.  Per family, he continues to do these as he was hospitalized with severe hyponatremia in the past.  -Continue salt tablets.   Permanent atrial fibrillation (HCC) -Rate controlled -Not on anticoagulation due to history of falls and skin tears  Asthma, mild intermittent, well-controlled History of  asbestosis, ILD -Currently stable, no wheezing  Hypertensive emergency: BP at [facility] was 225/119, was placed on IV nitroglycerin drip.  Now normotensive. -Continue amlodipine, hydralazine, Imdur, irbesartan, spironolactone   GERD -Continue PPI    Hyperlipidemia -Continue statin   OSA (obstructive sleep apnea) Not on CPAP  Obesity class II Estimated body mass index is 35.86 kg/m as calculated from the following:   Height as of this encounter: 5\' 9"  (1.753 m).   Weight as of this encounter: 110.1 kg.  Code Status: Full code DVT Prophylaxis:     Level of Care: Level of care: Telemetry Medical Family Communication: Updated patient's wife at bedside. Disposition Plan:   Remains inpatient  appropriate: CIR bed, left ankle fracture   Procedures:  2D echo   Consultants:   Cardiology Orthopedic surgery  Antimicrobials:   Anti-infectives (From admission, onward)    Start     Dose/Rate Route Frequency Ordered Stop   12/16/23 1000  cefTRIAXone (ROCEPHIN) 1 g in sodium chloride 0.9 % 100 mL IVPB  Status:  Discontinued        1 g 200 mL/hr over 30 Minutes Intravenous Every 24 hours 12/16/23 0847 12/16/23 1642   12/12/23 1000  cefTRIAXone (ROCEPHIN) 1 g in sodium chloride 0.9 % 100 mL IVPB  Status:  Discontinued        1 g 200 mL/hr over 30 Minutes Intravenous Every 24 hours 12/11/23 1455 12/16/23 0847          Medications  amLODipine  10 mg Oral Daily   aspirin  81 mg Oral Daily   clopidogrel  75 mg Oral Daily   diclofenac Sodium  2 g Topical QID   guaiFENesin  1,200 mg Oral BID   hydrALAZINE  100 mg Oral TID   irbesartan  150 mg Oral Daily   isosorbide mononitrate  60 mg Oral Daily   mometasone-formoterol  2 puff Inhalation BID   pantoprazole  40 mg Oral Daily   pravastatin  40 mg Oral QHS   QUEtiapine  50 mg Oral QHS   sodium chloride  1 g Oral TID WC   spironolactone  25 mg Oral Daily      Subjective:  Seen and examined earlier this morning and later this afternoon.  Patient is awake and alert but not a great historian partly due to diminished hearing and possible underlying cognitive impairment.  Patient's wife at bedside.  Reports pain in left ankle.  Also reports numbness in left foot..   Objective:   Vitals:   12/16/23 0853 12/16/23 0914 12/16/23 1253 12/16/23 1325  BP:  126/62 116/84 130/60  Pulse: 95 84 89   Resp: 20 19 18    Temp:  98.5 F (36.9 C) 98.1 F (36.7 C)   TempSrc:  Oral Oral   SpO2: 98%     Weight:      Height:        Intake/Output Summary (Last 24 hours) at 12/16/2023 1645 Last data filed at 12/16/2023 1209 Gross per 24 hour  Intake 240 ml  Output 2050 ml  Net -1810 ml     Wt Readings from Last 3 Encounters:   12/12/23 110.1 kg  11/19/23 109 kg  11/15/23 109.4 kg    Physical Exam GENERAL: No apparent distress.  Nontoxic. HEENT: MMM.  Vision and hearing grossly intact.  NECK: Supple.  No apparent JVD.  RESP:  No IWOB.  Fair aeration bilaterally. CVS:  RRR. Heart sounds normal.  ABD/GI/GU: BS+. Abd soft, NTND.  MSK/EXT:   Left foot in a splint.  Diminished light sensation but intact pain sensation.  Cap reflex intact.  No swelling in toes.  No skin dysregulation.  Right foot swelling with bruising over right great toe dorsally. SKIN: As above. NEURO: Awake and alert. Oriented appropriately.  No apparent focal neuro deficit. PSYCH: Calm. Normal affect.        Data Reviewed:  I have personally reviewed following labs    CBC Lab Results  Component Value Date   WBC 13.2 (H) 12/16/2023   RBC 3.80 (L) 12/16/2023   HGB 11.6 (L) 12/16/2023   HCT 33.8 (L) 12/16/2023   MCV 88.9 12/16/2023   MCH 30.5 12/16/2023   PLT 310 12/16/2023   MCHC 34.3 12/16/2023   RDW 14.0 12/16/2023   LYMPHSABS 0.9 12/11/2023   MONOABS 0.8 12/11/2023   EOSABS 1.1 (H) 12/11/2023   BASOSABS 0.1 12/11/2023     Last metabolic panel Lab Results  Component Value Date   NA 127 (L) 12/16/2023   K 3.7 12/16/2023   CL 95 (L) 12/16/2023   CO2 22 12/16/2023   BUN 13 12/16/2023   CREATININE 0.77 12/16/2023   GLUCOSE 94 12/16/2023   GFRNONAA >60 12/16/2023   GFRAA >60 12/31/2018   CALCIUM 8.4 (L) 12/16/2023   PHOS 3.7 12/16/2023   PROT 5.9 (L) 12/11/2023   ALBUMIN 2.4 (L) 12/16/2023   BILITOT 0.4 12/11/2023   ALKPHOS 72 12/11/2023   AST 100 (H) 12/11/2023   ALT 47 (H) 12/11/2023   ANIONGAP 10 12/16/2023    CBG (last 3)  No results for input(s): "GLUCAP" in the last 72 hours.    Coagulation Profile: No results for input(s): "INR", "PROTIME" in the last 168 hours.   Radiology Studies: I have personally reviewed the imaging studies  CT FOOT RIGHT WO CONTRAST Result Date: 12/15/2023 CLINICAL DATA:   Foot pain and swelling after recent fall. Fracture suspected. EXAM: CT OF THE RIGHT FOOT WITHOUT CONTRAST TECHNIQUE: Multidetector CT imaging of the right foot was performed according to the standard protocol. Multiplanar CT image reconstructions were also generated. RADIATION DOSE REDUCTION: This exam was performed according to the departmental dose-optimization program which includes automated exposure control, adjustment of the mA and/or kV according to patient size and/or use of iterative reconstruction technique. COMPARISON:  Radiographs 12/14/2023 and 07/17/2019. FINDINGS: Bones/Joint/Cartilage The bones are demineralized. There is no definite evidence of acute fracture or dislocation. Minimal irregularity of the base of the 5th metatarsal without definite acute fracture. Stable old healed fracture of the distal 5th metatarsal. Hammertoe deformities noted. There are scattered degenerative changes, most advanced at the 1st metatarsophalangeal joint. Mild midfoot degenerative changes are also noted. Fragmented spurring of the medial malleolus has a nonacute appearance. No significant joint effusions are identified. Ligaments Suboptimally assessed by CT. Muscles and Tendons As evaluated by CT, the ankle and foot tendons appear intact without significant tenosynovitis. Generalized atrophy of the foot musculature. Soft tissues Diffuse dorsal subcutaneous swelling without evidence of focal fluid collection, foreign body or soft tissue emphysema. IMPRESSION: 1. No definite evidence of acute fracture or dislocation in the right foot. Minimal irregularity of the base of the 5th metatarsal without definite fracture. Correlate with point tenderness and consider radiographic follow-up. 2. Diffuse dorsal subcutaneous swelling without evidence of focal fluid collection, foreign body or soft tissue emphysema. 3. Scattered degenerative changes, most advanced at the 1st metatarsophalangeal joint. Electronically Signed   By:  Carey Bullocks M.D.   On: 12/15/2023 16:09  CT ANKLE LEFT WO CONTRAST Result Date: 12/15/2023 CLINICAL DATA:  Recent fall.  Left ankle fracture. EXAM: CT OF THE LEFT ANKLE WITHOUT CONTRAST TECHNIQUE: Multidetector CT imaging of the left ankle was performed according to the standard protocol. Multiplanar CT image reconstructions were also generated. RADIATION DOSE REDUCTION: This exam was performed according to the departmental dose-optimization program which includes automated exposure control, adjustment of the mA and/or kV according to patient size and/or use of iterative reconstruction technique. COMPARISON:  Radiographs 12/12/2022. FINDINGS: Bones/Joint/Cartilage The ankle is splinted. The bones are diffusely demineralized. There is an oblique mildly displaced fracture of the distal fibular metadiaphysis which extends into the anterior aspect of the distal tibiofibular joint. There is a small avulsion fracture of the tip of the medial malleolus, best seen on the sagittal images. In addition, there is a nondisplaced intra-articular fracture of the posterior malleolus, also best seen on the sagittal images. There is no widening of the ankle mortise. The talus is located. No evidence of acute tarsal bone fracture or significant ankle joint effusion. There are mild midfoot degenerative changes. Ligaments Suboptimally assessed by CT. Muscles and Tendons As evaluated by CT, the ankle tendons appear intact without entrapment in the fractures. Soft tissues Mild soft tissue swelling around the ankle, greatest laterally. No focal fluid collection, foreign body or soft tissue emphysema. There is some calcification within the posterior aspect of the plantar fascia. IMPRESSION: 1. Trimalleolar fracture of the left ankle as described. The distal fibular component is mildly displaced. The tibial components are nondisplaced. 2. No widening of the ankle mortise or evidence of tarsal bone fracture. 3. The ankle tendons appear  intact without entrapment in the fractures. Electronically Signed   By: Carey Bullocks M.D.   On: 12/15/2023 15:58       Almon Hercules M.D. [facility] 12/16/2023, 4:45 PM  Available via Epic secure chat 7am-7pm After 7 pm, please refer to night coverage provider listed on amion.

## 2023-12-16 NOTE — Progress Notes (Addendum)
 Inpatient Rehab Coordinator Note:  I met with patient and spouse at bedside to discuss CIR recommendations and goals/expectations of CIR stay.  We reviewed 3 hrs/day of therapy, physician follow up, and average length of stay 2 weeks (dependent upon progress) with goals of supervision, likely w/c level given NWB restrictions to LLE.  They are interested in rehab, they have some questions about orthopedic follow up (seems to be some uncertainty regarding f/u in the acute setting vs. Outpatient), timing of transfer to rehab, and responsibility for cost.  Pt also mentions prior to my leaving that his LLE felt "different" than anytime he'd previous broken a bone.  On assessment, he endorses N/T across ventral aspect of ankle joint, and lack of sensation to light touch/moderate pressure to all toes.  Toes are pale, and very mildly cool to touch.  I let them know I would notify MD.  We will follow.    Estill Dooms, PT, DPT Admissions Coordinator [phone] 12/16/23  3:45 PM

## 2023-12-16 NOTE — PMR Pre-admission (Signed)
 PMR Admission Coordinator Pre-Admission Assessment  Patient: Julian Fowler is an 89 y.o., male MRN: [id number] DOB: November 14, 1934 Height: 5\' 9"  (175.3 cm) Weight: 110.1 kg  Insurance Information HMO:     PPO:      PCP:      IPA:      80/20:      OTHER:  PRIMARY: Medicare Part A and B      Policy#: [id number]       Subscriber: pt CM Name:       Phone#:      Fax#:  Pre-Cert#: verified online      Employer:  Benefits:  Phone #:      Name:  Eff. Date:  04/14/02 A/B      Deduct: $1676      Out of Pocket Max: n/a      Life Max: n/a CIR: 100%      SNF: 20 full days Outpatient: 80%     Co-Pay: 20% Home Health: 100%      Co-Pay:  DME: 80%     Co-Pay: 20% Providers:  SECONDARYSusa Simmonds      Policy#: [id number]      Phone#: [phone]  Financial Counselor:       Phone#:   The "Data Collection Information Summary" for patients in Inpatient Rehabilitation Facilities with attached "Privacy Act Statement-Health Care Records" was provided and verbally reviewed with: Patient and Family  Emergency Contact Information Contact Information     Name Relation Home Work Mobile   Cedano,Lois Spouse [phone]  [phone]   Mikki Santee Daughter   [phone]      Other Contacts   None on File     Current Medical History  Patient Admitting Diagnosis: NSTEMI/LLE fracture  History of Present Illness: pt is an 89 y/o male with PMH of HTN, hyperlipidemia, GERD, afib, CVA, AV fistula, acoustic neuroma, BPH, anxiety, ILD, OSA who initially presented to [facility] on 2/19 for c/o fogginess/dizziness/lightheadedness found to have hypertensive emergency with BPs significantly elevated per report.  Pt was started on a nitroglycerin infusion at [facility].  On 2/26 he developed crushing chest pain, EKG showed evidence of ST depression in addition to uptrending troponin, and pt was transferred to [facility] for emergent cardiac cath.  Troponin as high as 7551.  Hospital course complicated by confusion  and so invasive evaluation was deferred and cardiology opted for medical management.  Pt had a fall on 2/28 resulted in L distal fibular fracture.  Orthopedics was consulted and recommended cam boot and NWB to LLE with outpatient f/u.  On therapy reassessment, pt significantly limited in mobility 2/2 weight bearing restriction.  He was switched to a plaster posterior short leg splint with stirrup.  Therapy evaluations were completed and pt was recommended for CIR.     Patient's medical record from [facility] has been reviewed by the rehabilitation admission coordinator and physician.  Past Medical History  Past Medical History:  Diagnosis Date   Allergic rhinitis    Aortic stenosis    Arthritis    Atrial fibrillation (HCC)    BPH (benign prostatic hyperplasia)    Coronary artery calcification seen on CT scan    Easy bruisability    ED (erectile dysfunction)    Essential hypertension    GERD (gastroesophageal reflux disease)    H/O hiatal hernia    Hearing loss in left ear    History of asbestosis    History of shingles    Hyperlipidemia  Overactive bladder    PSVT (paroxysmal supraventricular tachycardia) (HCC)    Sleep apnea    No longer on CPAP following weight loss   Tinnitus     Has the patient had major surgery during 100 days prior to admission? No  Family History   family history includes Alcohol abuse in his sister; Alzheimer's disease in his sister; Breast cancer in his mother; Cancer in his father; Heart attack in his mother; Hypertension in his sister; Kidney disease in his sister.  Current Medications  Current Facility-Administered Medications:    acetaminophen (TYLENOL) tablet 650 mg, 650 mg, Oral, Q4H PRN, Synetta Fail, MD, 650 mg at 12/19/23 0844   albuterol (PROVENTIL) (2.5 MG/3ML) 0.083% nebulizer solution 2.5 mg, 2.5 mg, Nebulization, Q6H PRN, Synetta Fail, MD   amLODipine (NORVASC) tablet 10 mg, 10 mg, Oral, Daily, Marykay Lex, MD, 10 mg  at 12/19/23 0844   clopidogrel (PLAVIX) tablet 75 mg, 75 mg, Oral, Daily, Marykay Lex, MD, 75 mg at 12/19/23 0845   diclofenac Sodium (VOLTAREN) 1 % topical gel 2 g, 2 g, Topical, QID, Segars, Christiane Ha, MD, 2 g at 12/16/23 2225   guaiFENesin (MUCINEX) 12 hr tablet 1,200 mg, 1,200 mg, Oral, BID, Segars, Christiane Ha, MD, 1,200 mg at 12/19/23 0845   heparin injection 5,000 Units, 5,000 Units, Subcutaneous, Q8H, Carollee Herter, DO, 5,000 Units at 12/19/23 4098   hydrALAZINE (APRESOLINE) tablet 100 mg, 100 mg, Oral, TID, Synetta Fail, MD, 100 mg at 12/19/23 0845   irbesartan (AVAPRO) tablet 150 mg, 150 mg, Oral, Daily, Marykay Lex, MD, 150 mg at 12/19/23 0844   isosorbide mononitrate (IMDUR) 24 hr tablet 60 mg, 60 mg, Oral, Daily, Marykay Lex, MD, 60 mg at 12/19/23 0845   LORazepam (ATIVAN) tablet 0.5 mg, 0.5 mg, Oral, TID PRN, Carollee Herter, DO, 0.5 mg at 12/19/23 0844   mometasone-formoterol (DULERA) 200-5 MCG/ACT inhaler 2 puff, 2 puff, Inhalation, BID, Synetta Fail, MD, 2 puff at 12/19/23 0845   nitroGLYCERIN (NITROSTAT) SL tablet 0.4 mg, 0.4 mg, Sublingual, Q5 Min x 3 PRN, Synetta Fail, MD, 0.4 mg at 12/12/23 2035   ondansetron [facility]) injection 4 mg, 4 mg, Intravenous, Q6H PRN, Synetta Fail, MD   pantoprazole (PROTONIX) EC tablet 40 mg, 40 mg, Oral, Daily, Synetta Fail, MD, 40 mg at 12/19/23 0845   pravastatin (PRAVACHOL) tablet 40 mg, 40 mg, Oral, QHS, Synetta Fail, MD, 40 mg at 12/18/23 2041   QUEtiapine (SEROQUEL) tablet 50 mg, 50 mg, Oral, QHS, Synetta Fail, MD, 50 mg at 12/18/23 2041   sodium chloride tablet 1 g, 1 g, Oral, TID WC, Synetta Fail, MD, 1 g at 12/19/23 0845   spironolactone (ALDACTONE) tablet 25 mg, 25 mg, Oral, Daily, Synetta Fail, MD, 25 mg at 12/19/23 0845   traMADol (ULTRAM) tablet 25 mg, 25 mg, Oral, Q12H PRN, Dolly Rias, MD, 25 mg at 12/16/23 2221  Patients Current Diet:  Diet Order              Diet regular Fluid consistency: Thin  Diet effective now                   Precautions / Restrictions Precautions Precautions: Fall Precaution/Restrictions Comments: fall on 2/28 with PT Other Brace: L posterior LE splint with stirup Restrictions Weight Bearing Restrictions Per Provider Order: Yes LLE Weight Bearing Per Provider Order: Non weight bearing Other Position/Activity Restrictions: L LE posterior splint with stirrup,  with ACE wrap   Has the patient had 2 or more falls or a fall with injury in the past year? Yes  Prior Activity Level Limited Community (1-2x/wk): mod I with SPC at baseline, still driving per pt report  Prior Functional Level Self Care: Did the patient need help bathing, dressing, using the toilet or eating? Independent  Indoor Mobility: Did the patient need assistance with walking from room to room (with or without device)? Independent  Stairs: Did the patient need assistance with internal or external stairs (with or without device)? Independent  Functional Cognition: Did the patient need help planning regular tasks such as shopping or remembering to take medications? Independent  Patient Information Are you of Hispanic, Latino/a,or Spanish origin?: A. No, not of Hispanic, Latino/a, or Spanish origin What is your race?: A. White Do you need or want an interpreter to communicate with a doctor or health care staff?: 0. No  Patient's Response To:  Health Literacy and Transportation Is the patient able to respond to health literacy and transportation needs?: Yes Health Literacy - How often do you need to have someone help you when you read instructions, pamphlets, or other written material from your doctor or pharmacy?: Never In the past 12 months, has lack of transportation kept you from medical appointments or from getting medications?: No In the past 12 months, has lack of transportation kept you from meetings, work, or from getting things needed for  daily living?: No  Home Assistive Devices / Equipment Home Equipment: Agricultural consultant (2 wheels), [employer] - single point, Occupational hygienist (4 wheels), Shower seat - built in, [employer] - toilet, Grab bars - tub/shower  Prior Device Use: Indicate devices/aids used by the patient prior to current illness, exacerbation or injury? None of the above  Current Functional Level Cognition  Orientation Level: Oriented X4    Extremity Assessment (includes Sensation/Coordination)  Upper Extremity Assessment: Defer to OT evaluation  Lower Extremity Assessment: Generalized weakness, RLE deficits/detail, LLE deficits/detail RLE Deficits / Details: Bruising of R great toe and foot swelling. Pt reported R foot as being sore LLE Deficits / Details: in CAM boot s/p fibula fracture    ADLs  Overall ADL's : Needs assistance/impaired Eating/Feeding: Set up, Bed level Grooming: Set up Upper Body Bathing: Moderate assistance, Bed level, Sitting Lower Body Bathing: +2 for physical assistance, Bed level, Maximal assistance, +2 for safety/equipment Upper Body Dressing : Moderate assistance Lower Body Dressing: Total assistance, Maximal assistance, +2 for physical assistance, +2 for safety/equipment, Bed level General ADL Comments: focused on transfer to bed and bed mobility    Mobility  Overal bed mobility: Needs Assistance Bed Mobility: Sit to Supine Supine to sit: Used rails, HOB elevated, Mod assist Sit to supine: Mod assist, +2 for physical assistance General bed mobility comments: assistance with BLE and trunk    Transfers  Overall transfer level: Needs assistance Equipment used: Rolling walker (2 wheels), None Transfers: Sit to/from Stand, Bed to chair/wheelchair/BSC Sit to Stand: Max assist, +2 physical assistance, From elevated surface Bed to/from chair/wheelchair/BSC transfer type:: Squat pivot Squat pivot transfers: Max assist, +2 physical assistance General transfer comment: transfer from recliner to  EOB with squat pivot transfer and max assist +2 with frequent cues for WB precautions. Sit to stand attempted from EOB with patient unable to come to complete stand    Ambulation / Gait / Stairs / Wheelchair Mobility  Ambulation/Gait Ambulation/Gait assistance: Min assist (maxA with loss of balance resulting in fall, otherwise minA with fatigue)  Gait Distance (Feet): 120 Feet (additional trial of 5') Assistive device: Straight cane, 1 person hand held assist Gait Pattern/deviations: Step-to pattern, Wide base of support General Gait Details: unable to maintain NWB on L LE with coming to standing Gait velocity: reduced Gait velocity interpretation: <1.8 ft/sec, indicate of risk for recurrent falls    Posture / Balance Dynamic Sitting Balance Sitting balance - Comments: posterior lean and UE support to maintain Balance Overall balance assessment: Needs assistance Sitting-balance support: Feet supported, Single extremity supported, Bilateral upper extremity supported Sitting balance-Leahy Scale: Fair Sitting balance - Comments: posterior lean and UE support to maintain Postural control: Posterior lean Standing balance support: Bilateral upper extremity supported Standing balance-Leahy Scale: Poor Standing balance comment: unable to stand for extended period of time due to difficulty maintaining WB precautions    Special needs/care consideration N/a   Previous Home Environment (from acute therapy documentation) Living Arrangements: Spouse/significant other Available Help at Discharge: Family, Available 24 hours/day Type of Home: House Home Layout: Laundry or work area in basement Home Access: Stairs to enter Entrance Stairs-Rails: None Secretary/administrator of Steps: 1 Bathroom Shower/Tub: Health visitor: Handicapped height Bathroom Accessibility: Yes Home Care Services: No  Discharge Living Setting Plans for Discharge Living Setting: Patient's home, Lives with  (comment) (spouse) Type of Home at Discharge: House Discharge Home Layout: Laundry or work area in basement Discharge Home Access: Stairs to enter Entrance Stairs-Rails: None Secretary/administrator of Steps: 1 Discharge Bathroom Shower/Tub: Walk-in shower Discharge Bathroom Toilet: Handicapped height Discharge Bathroom Accessibility: Yes How Accessible: Accessible via wheelchair Does the patient have any problems obtaining your medications?: No  Social/Family/Support Systems Patient Roles: Spouse Anticipated Caregiver: spouse, Tanner Yeley Anticipated Caregiver's Contact Information: [phone] Ability/Limitations of Caregiver: can provide supervision for mobility and light assist for ADLs Caregiver Availability: 24/7 Discharge Plan Discussed with Primary Caregiver: Yes Is Caregiver In Agreement with Plan?: Yes Does Caregiver/Family have Issues with Lodging/Transportation while Pt is in Rehab?: No  Goals Patient/Family Goal for Rehab: PT/OT supervision w/c level, SLP n/a Expected length of stay: 18-21 days Additional Information: Discharge plan: return to pt's home with spouse Pt/Family Agrees to Admission and willing to participate: Yes Program Orientation Provided & Reviewed with Pt/Caregiver Including Roles  & Responsibilities: Yes  Decrease burden of Care through IP rehab admission: n/a  Possible need for SNF placement upon discharge:  Not anticipated.  Plan for d/c home with spouse to provide 24/7 supervision.    Patient Condition: I have reviewed medical records from [facility], spoken with CM, and patient and spouse. I met with patient at the bedside for inpatient rehabilitation assessment.  Patient will benefit from ongoing PT and OT, can actively participate in 3 hours of therapy a day 5 days of the week, and can make measurable gains during the admission.  Patient will also benefit from the coordinated team approach during an Inpatient Acute Rehabilitation admission.  The  patient will receive intensive therapy as well as Rehabilitation physician, nursing, social worker, and care management interventions.  Due to safety, skin/wound care, disease management, medication administration, pain management, and patient education the patient requires 24 hour a day rehabilitation nursing.  The patient is currently max assist with mobility and basic ADLs.  Discharge setting and therapy post discharge at home with home health is anticipated.  Patient has agreed to participate in the Acute Inpatient Rehabilitation Program and will admit today.  Preadmission Screen Completed By:  Stephania Fragmin, PT, DPT 12/19/2023 11:47 AM ______________________________________________________________________  Discussed status with Dr. Shearon Stalls on 12/19/23  at 11:47 AM  and received approval for admission today.  Admission Coordinator:  Stephania Fragmin, PT, DPT time 11:47 AM Dorna Bloom 12/19/23    Assessment/Plan: Diagnosis: NSTEMI Does the need for close, 24 hr/day Medical supervision in concert with the patient's rehab needs make it unreasonable for this patient to be served in a less intensive setting? Yes Co-Morbidities requiring supervision/potential complications: UTI s/p antibiotics, delirium.agitaion, NSTEMI s/p catheterization, left fibular fracture, hyponatremia, hypertension, GERD, HLD, and OSA Due to bladder management, bowel management, safety, skin/wound care, disease management, medication administration, pain management, and patient education, does the patient require 24 hr/day rehab nursing? Yes Does the patient require coordinated care of a physician, rehab nurse, PT, OT, and SLP to address physical and functional deficits in the context of the above medical diagnosis(es)? Yes Addressing deficits in the following areas: balance, endurance, locomotion, strength, transferring, bowel/bladder control, bathing, dressing, feeding, grooming, and cognition Can the patient actively participate  in an intensive therapy program of at least 3 hrs of therapy 5 days a week? Yes The potential for patient to make measurable gains while on inpatient rehab is good Anticipated functional outcomes upon discharge from inpatient rehab: supervision PT, supervision OT, supervision SLP Estimated rehab length of stay to reach the above functional goals is: 18-21 days Anticipated discharge destination: Home 10. Overall Rehab/Functional Prognosis: good   MD Signature:  Angelina Sheriff, DO 12/19/2023

## 2023-12-16 NOTE — Progress Notes (Signed)
 Physical Therapy Treatment Patient Details Name: Julian Fowler MRN: [id number] DOB: Sep 17, 1935 Today's Date: 12/16/2023   History of Present Illness 89 y.o. male presents to [facility] hospital on 12/11/2023 from [facility] with NSTEMI and UTI. Of note, pt had a fall on 12/13/23 and received a left fibular fracture. PMH includes HTN, HLD, afib, acoustic neuroma, BPH, asthma, anxiety, ILD, OSA.    PT Comments  Pt and wife in room, requesting pt transfer to recliner to eat his lunch. Pt reports pain in his R foot with weightbearing. PT examined and although great toe is discolored, pt able to plantarflex and dorsiflex against resistance with minimal pain. Discussed healing duration of bone fx 6-8 weeks normally but would need to follow up with orthopedist as to changes in weight bearing status. Attempted to come to standing in RW for hop  pivot transfer but pt unable to initiate power up without weightbearing through L LE. Instead, performed squat pivot transfer from bed to drop arm recliner with maximal cuing and maxAx2. Will work on Radio producer. D/c plans remain appropriate.    If plan is discharge home, recommend the following: Two people to help with walking and/or transfers;Two people to help with bathing/dressing/bathroom;Assistance with cooking/housework;Assist for transportation;Help with stairs or ramp for entrance   Can travel by private vehicle      No  Equipment Recommendations  BSC/3in1;Rolling walker (2 wheels);Wheelchair (measurements PT)    Recommendations for Other Services Rehab consult     Precautions / Restrictions Precautions Precautions: Fall Recall of Precautions/Restrictions: Impaired Precaution/Restrictions Comments: fall on 2/28 with PT Required Braces or Orthoses: Other Brace Other Brace: L posterior LE splint with stirup Restrictions Weight Bearing Restrictions Per Provider Order: Yes LLE Weight Bearing Per Provider Order: Non weight bearing Other  Position/Activity Restrictions: L LE posterior splint with stirrup, with ACE wrap     Mobility  Bed Mobility Overal bed mobility: Needs Assistance Bed Mobility: Supine to Sit     Supine to sit: Used rails, HOB elevated, Mod assist     General bed mobility comments: with HoB elevated pt able to bring LE off bed, attempts to use increased momentum to come to EoB, however needs modA to pull against PT to come all the way to upright.    Transfers Overall transfer level: Needs assistance Equipment used: Rolling walker (2 wheels) Transfers: Sit to/from Stand, Bed to chair/wheelchair/BSC Sit to Stand: Total assist, From elevated surface     Squat pivot transfers: Max assist, +2 physical assistance     General transfer comment: Attempt to have pt come to standing in RW, pt with poor understanding of proper RW use, increased cues for hand placement, before even starting pt reports dressing on L LE too slippery to stand, reiterated that pt not to put any weight on L LE, pt reports understanding but as he starts to push up from bed place L foot on floor to powerup. As pt can not maintain weightbearing restrictions with use of RW, educated pt on need for pivot transfer to drop arm recliner, pt requires redirection to hand placement and sequencing of pivot transfer. Pt ultimately requires maxAx2 to achieve pivot. Lift pad in chair for transfer back to bed after lunch    Ambulation/Gait               General Gait Details: unable to maintain NWB on L LE with coming to standing       Balance Overall balance assessment: Needs assistance Sitting-balance support: Feet  supported, Single extremity supported, Bilateral upper extremity supported Sitting balance-Leahy Scale: Fair Sitting balance - Comments: posterior lean and UE support to maintain Postural control: Posterior lean     Standing balance comment: Not completed s/t weight bearing precautions and LE weakness                             Communication Communication Communication: Impaired Factors Affecting Communication: Hearing impaired  Cognition Arousal: Alert Behavior During Therapy: WFL for tasks assessed/performed   PT - Cognitive impairments: Awareness, Attention, Problem solving, Safety/Judgement                       PT - Cognition Comments: pt with poor understanding of his L LE weightbearing restrictions, poor safety awareness with attempt to use RW, requires increased commands. Following commands: Intact      Cueing Cueing Techniques: Verbal cues, Gestural cues, Tactile cues     General Comments General comments (skin integrity, edema, etc.): Wife present during session, very concerned about results of imaging as physician has not fully explained them to pt and family      Pertinent Vitals/Pain Pain Assessment Pain Assessment: Faces Faces Pain Scale: Hurts little more Pain Location: R great toe with ROM Pain Descriptors / Indicators: Discomfort, Grimacing, Guarding, Sore Pain Intervention(s): Limited activity within patient's tolerance, Monitored during session, Repositioned    Home Living Family/patient expects to be discharged to:: Private residence Living Arrangements: Spouse/significant other Available Help at Discharge: Family;Available 24 hours/day Type of Home: House Home Access: Stairs to enter Entrance Stairs-Rails: None Entrance Stairs-Number of Steps: 1   Home Layout: Laundry or work area in basement Home Equipment: Agricultural consultant (2 wheels);Cane - single point;Rollator (4 wheels);Shower seat - built in;Grab bars - toilet;Grab bars - tub/shower          PT Goals (current goals can now be found in the care plan section) Acute Rehab PT Goals Patient Stated Goal: improve mobility PT Goal Formulation: With patient/family Time For Goal Achievement: 12/28/23 Potential to Achieve Goals: Good Progress towards PT goals: Progressing toward goals     Frequency    Min 1X/week       AM-PAC PT "6 Clicks" Mobility   Outcome Measure  Help needed turning from your back to your side while in a flat bed without using bedrails?: Total Help needed moving from lying on your back to sitting on the side of a flat bed without using bedrails?: Total Help needed moving to and from a bed to a chair (including a wheelchair)?: Total Help needed standing up from a chair using your arms (e.g., wheelchair or bedside chair)?: Total Help needed to walk in hospital room?: Total Help needed climbing 3-5 steps with a railing? : Total 6 Click Score: 6    End of Session Equipment Utilized During Treatment: Gait belt Activity Tolerance: Patient tolerated treatment well Patient left: with call bell/phone within reach;with family/visitor present;in chair Nurse Communication: Mobility status PT Visit Diagnosis: Unsteadiness on feet (R26.81);Other abnormalities of gait and mobility (R26.89);Muscle weakness (generalized) (M62.81);Difficulty in walking, not elsewhere classified (R26.2);Pain Pain - Right/Left: Left Pain - part of body: Ankle and joints of foot     Time: 1206-1236 PT Time Calculation (min) (ACUTE ONLY): 30 min  Charges:    $Therapeutic Activity: 23-37 mins PT General Charges $$ ACUTE PT VISIT: 1 Visit  Gissell Barra B. Beverely Risen PT, DPT Acute Rehabilitation Services Please use secure chat or  Call Office [phone]    Elon Alas Fleet 12/16/2023, 1:16 PM

## 2023-12-17 DIAGNOSIS — J61 Pneumoconiosis due to asbestos and other mineral fibers: Secondary | ICD-10-CM | POA: Diagnosis not present

## 2023-12-17 DIAGNOSIS — F419 Anxiety disorder, unspecified: Secondary | ICD-10-CM | POA: Diagnosis not present

## 2023-12-17 DIAGNOSIS — I4821 Permanent atrial fibrillation: Secondary | ICD-10-CM | POA: Diagnosis not present

## 2023-12-17 DIAGNOSIS — I214 Non-ST elevation (NSTEMI) myocardial infarction: Secondary | ICD-10-CM | POA: Diagnosis not present

## 2023-12-17 LAB — MAGNESIUM: Magnesium: 1.5 mg/dL — ABNORMAL LOW (ref 1.7–2.4)

## 2023-12-17 LAB — RENAL FUNCTION PANEL
Albumin: 2.4 g/dL — ABNORMAL LOW (ref 3.5–5.0)
Anion gap: 10 (ref 5–15)
BUN: 11 mg/dL (ref 8–23)
CO2: 23 mmol/L (ref 22–32)
Calcium: 8.2 mg/dL — ABNORMAL LOW (ref 8.9–10.3)
Chloride: 96 mmol/L — ABNORMAL LOW (ref 98–111)
Creatinine, Ser: 0.79 mg/dL (ref 0.61–1.24)
GFR, Estimated: >60 mL/min (ref >60)
Glucose, Bld: 88 mg/dL (ref 70–99)
Phosphorus: 3.7 mg/dL (ref 2.5–4.6)
Potassium: 4 mmol/L (ref 3.5–5.1)
Sodium: 129 mmol/L — ABNORMAL LOW (ref 135–145)

## 2023-12-17 LAB — CBC
HCT: 31.3 % — ABNORMAL LOW (ref 39.0–52.0)
Hemoglobin: 10.8 g/dL — ABNORMAL LOW (ref 13.0–17.0)
MCH: 30.3 pg (ref 26.0–34.0)
MCHC: 34.5 g/dL (ref 30.0–36.0)
MCV: 87.7 fL (ref 80.0–100.0)
Platelets: 359 10*3/uL (ref 150–400)
RBC: 3.57 MIL/uL — ABNORMAL LOW (ref 4.22–5.81)
RDW: 13.8 % (ref 11.5–15.5)
WBC: 9.3 10*3/uL (ref 4.0–10.5)
nRBC: 0 % (ref 0.0–0.2)

## 2023-12-17 NOTE — Progress Notes (Signed)
 [facility]                                                                              Julian Fowler, is a 89 y.o. male, DOB - 1934/12/30, ZOX:[id number] Admit date - 12/11/2023    Outpatient Primary MD for the patient is [name], [name], MD  LOS - 6  days  No chief complaint on file.      Brief summary   Patient is a 89 year old male with HTN, hyperlipidemia, GERD, A-fib, cerebrovascular AV fistula, acoustic neuroma, BPH, asthma anxiety, ILD, asbestosis, OSA presented from [facility] with NSTEMI.    Patient initially presented dated on 2/19 with hypertensive emergency, dizziness, fogginess.  He was treated with nitroglycerin infusion and weaned off to p.o. medications.    He was also found to have UTI, placed on IV Rocephin, developed delirium and agitation secondary to sundowning.  He had a negative MRI. On 2/25, patient had a significant crushing chest painEKG showed evidence of ST depression diffusely and elevated troponin to 1819.  He was treated with aspirin, nitroglycerin, Lovenox and metoprolol.  He was transferred to [facility] for NSTEMI evaluation, management and cardiac cath.  Cardiology was consulted.   Troponin noted to be continuing to trend up, here 7237.   Assessment & Plan    Principal Problem:   NSTEMI (non-ST elevated myocardial infarction) [facility]) Initially admitted at [facility] for HTN emergency and treated with IV nitro.  Troponin peaked at 7551. TTE with LVEF of 50 to 55% and no RWMA.  Evaluated by cardiology. -Cardiology recommended Plavix monotherapy at least for 1 months (preferably for 3 months) followed by low-dose aspirin -Continue Imdur, statin    Acute metabolic encephalopathy superimposed on underlying ?mild dementia, anxiety -Likely worsened due to UTI, hyponatremia, sodium was 119 at [facility] -Completed IV Rocephin -Currently alert and oriented, appears at baseline. -Continue Seroquel (will need prescription at discharge)  Fall  with left fibular fracture -Patient fell during PT evaluation on 2/28, lost balance after tripping over PT's foot -X-ray revealed oblique fibular fracture on left -Ortho recommended CAM Walker boot and short leg splint, NWB on LLE, walker and follow-up in 5 to 7 days.   -CT 3/2 however showed a trimalleolar ankle fracture.  CT right foot with soft tissue swelling but no acute fracture. -Patient now requesting orthopedic reevaluation if any plans of surgery and could be done before CIR-message sent to orthopedics  UTI, UA positive on 12/24 at [facility] -Has completed IV Rocephin 2/24-3/3 -Prior history of recurrent UTIs, urine cultures have shown E. coli in the past   Acute on chronic hyponatremia - Na 119 at [facility], has been on salt tablets.  Per family, he continues to do these as he was hospitalized with severe hyponatremia in the past -Stable, continue salt tabs    Permanent atrial fibrillation (HCC) -Rate controlled -Not on anticoagulation due to history of falls and skin tears    Asthma, mild intermittent, well-controlled History of asbestosis, ILD -Currently stable, no wheezing  Hypertension -Patient had originally presented with hypertensive emergency to [facility] with a BP 225/119, was placed on IV nitroglycerin drip -Continue  amlodipine, hydralazine, Imdur, irbesartan, spironolactone    GERD -Continue PPI     Hyperlipidemia -Continue statin     OSA (obstructive sleep apnea) Not on CPAP  Obesity class II Estimated body mass index is 35.86 kg/m as calculated from the following:   Height as of this encounter: 5\' 9"  (1.753 m).   Weight as of this encounter: 110.1 kg.  Code Status: Full code DVT Prophylaxis:     Level of Care: Level of care: Telemetry Medical Family Communication: Updated patient's wife at the bedside. Disposition Plan:      Remains inpatient appropriate:  patient now requesting orthopedic reevaluation to assess if any plans for surgery before  CIR   Procedures:  2D echo   Consultants:   Cardiology  Antimicrobials:   Anti-infectives (From admission, onward)    Start     Dose/Rate Route Frequency Ordered Stop   12/16/23 1000  cefTRIAXone (ROCEPHIN) 1 g in sodium chloride 0.9 % 100 mL IVPB  Status:  Discontinued        1 g 200 mL/hr over 30 Minutes Intravenous Every 24 hours 12/16/23 0847 12/16/23 1642   12/12/23 1000  cefTRIAXone (ROCEPHIN) 1 g in sodium chloride 0.9 % 100 mL IVPB  Status:  Discontinued        1 g 200 mL/hr over 30 Minutes Intravenous Every 24 hours 12/11/23 1455 12/16/23 0847          Medications  amLODipine  10 mg Oral Daily   clopidogrel  75 mg Oral Daily   diclofenac Sodium  2 g Topical QID   guaiFENesin  1,200 mg Oral BID   hydrALAZINE  100 mg Oral TID   irbesartan  150 mg Oral Daily   isosorbide mononitrate  60 mg Oral Daily   mometasone-formoterol  2 puff Inhalation BID   pantoprazole  40 mg Oral Daily   pravastatin  40 mg Oral QHS   QUEtiapine  50 mg Oral QHS   sodium chloride  1 g Oral TID WC   spironolactone  25 mg Oral Daily      Subjective:   [name] was seen and examined today.  Pain in the left ankle, no acute nausea vomiting, chest pain or shortness of breath.  Wife at the bedside.   Objective:   Vitals:   12/16/23 2221 12/17/23 0433 12/17/23 0920 12/17/23 1229  BP: (!) 164/75 (!) 154/79 131/77   Pulse:  86  85  Resp:  16 15 15   Temp:  98.2 F (36.8 C) 98.4 F (36.9 C) 97.6 F (36.4 C)  TempSrc:  Oral Oral Oral  SpO2:  95%  95%  Weight:      Height:        Intake/Output Summary (Last 24 hours) at 12/17/2023 1457 Last data filed at 12/17/2023 1300 Gross per 24 hour  Intake 576 ml  Output 1950 ml  Net -1374 ml     Wt Readings from Last 3 Encounters:  12/12/23 110.1 kg  11/19/23 109 kg  11/15/23 109.4 kg   Physical Exam General: Alert and oriented x 3, NAD, hearing deficit Cardiovascular: S1 S2 clear, RRR.  Respiratory: CTAB, no  wheezing Gastrointestinal: Soft, nontender, nondistended, NBS Ext: left foot in splint, Neuro: no new deficits Psych: Normal affect        Data Reviewed:  I have personally reviewed following labs    CBC Lab Results  Component Value Date   WBC 9.3 12/17/2023   RBC 3.57 (L) 12/17/2023  HGB 10.8 (L) 12/17/2023   HCT 31.3 (L) 12/17/2023   MCV 87.7 12/17/2023   MCH 30.3 12/17/2023   PLT 359 12/17/2023   MCHC 34.5 12/17/2023   RDW 13.8 12/17/2023   LYMPHSABS 0.9 12/11/2023   MONOABS 0.8 12/11/2023   EOSABS 1.1 (H) 12/11/2023   BASOSABS 0.1 12/11/2023     Last metabolic panel Lab Results  Component Value Date   NA 129 (L) 12/17/2023   K 4.0 12/17/2023   CL 96 (L) 12/17/2023   CO2 23 12/17/2023   BUN 11 12/17/2023   CREATININE 0.79 12/17/2023   GLUCOSE 88 12/17/2023   GFRNONAA >60 12/17/2023   GFRAA >60 12/31/2018   CALCIUM 8.2 (L) 12/17/2023   PHOS 3.7 12/17/2023   PROT 5.9 (L) 12/11/2023   ALBUMIN 2.4 (L) 12/17/2023   BILITOT 0.4 12/11/2023   ALKPHOS 72 12/11/2023   AST 100 (H) 12/11/2023   ALT 47 (H) 12/11/2023   ANIONGAP 10 12/17/2023    CBG (last 3)  No results for input(s): "GLUCAP" in the last 72 hours.    Coagulation Profile: No results for input(s): "INR", "PROTIME" in the last 168 hours.   Radiology Studies: I have personally reviewed the imaging studies  No results found.      Thad Ranger M.D. [facility] 12/17/2023, 2:57 PM  Available via Epic secure chat 7am-7pm After 7 pm, please refer to night coverage provider listed on amion.

## 2023-12-17 NOTE — Progress Notes (Signed)
 Physical Therapy Treatment Patient Details Name: Julian Fowler MRN: [id number] DOB: 01/21/1935 Today's Date: 12/17/2023   History of Present Illness 89 y.o. male presents to [facility] hospital on 12/11/2023 from [facility] with NSTEMI and UTI. Of note, pt had a fall on 12/13/23 and received a left fibular fracture. PMH includes HTN, HLD, afib, acoustic neuroma, BPH, asthma, anxiety, ILD, OSA.    PT Comments  Pt sitting up in recliner wanting to get back to bed to wash up. PT provided pt and wife with increased education about L LE weightbearing status. Pt voices understanding but poor carryover and decreased command follow during transfer to maintain. Pt requires maxAx2 for squat pivot transfer from recliner to bed. Attempted to progress mobility by having pt come to standing with RW. Pt reports not wanting to use RW, feels it is too "flimsy" to hold him. Provided extensive education about alternative assistive devices. Ultimately, attempted STS in RW with maxAx2and pt unable to sequence standing with NWB on L LE, so PT instructed pt to sit. Pt return to supine with modAx2. D/c plans remain appropriate at this time. PT will continue to follow acutely.    If plan is discharge home, recommend the following: Two people to help with walking and/or transfers;Two people to help with bathing/dressing/bathroom;Assistance with cooking/housework;Assist for transportation;Help with stairs or ramp for entrance   Can travel by private vehicle      No  Equipment Recommendations  BSC/3in1;Rolling walker (2 wheels);Wheelchair (measurements PT)    Recommendations for Other Services Rehab consult     Precautions / Restrictions Precautions Precautions: Fall Recall of Precautions/Restrictions: Impaired Precaution/Restrictions Comments: fall on 2/28 with PT Required Braces or Orthoses: Other Brace Other Brace: L posterior LE splint with stirup Restrictions Weight Bearing Restrictions Per Provider Order: Yes LLE  Weight Bearing Per Provider Order: Non weight bearing Other Position/Activity Restrictions: L LE posterior splint with stirrup, with ACE wrap     Mobility  Bed Mobility Overal bed mobility: Needs Assistance Bed Mobility: Sit to Supine       Sit to supine: Mod assist, +2 for physical assistance   General bed mobility comments: assistance with BLE and trunk    Transfers Overall transfer level: Needs assistance Equipment used: Rolling walker (2 wheels), None Transfers: Sit to/from Stand, Bed to chair/wheelchair/BSC Sit to Stand: Max assist, +2 physical assistance, From elevated surface     Squat pivot transfers: Max assist, +2 physical assistance     General transfer comment: transfer from recliner to EOB with squat pivot transfer and max assist +2 with frequent cues for WB precautions. Sit to stand attempted from EOB with patient unable to come to complete stand        Balance Overall balance assessment: Needs assistance Sitting-balance support: Feet supported, Single extremity supported, Bilateral upper extremity supported Sitting balance-Leahy Scale: Fair   Postural control: Posterior lean Standing balance support: Bilateral upper extremity supported Standing balance-Leahy Scale: Poor Standing balance comment: unable to stand for extended period of time due to difficulty maintaining WB precautions                            Communication Communication Communication: Impaired Factors Affecting Communication: Hearing impaired  Cognition Arousal: Alert Behavior During Therapy: Impulsive   PT - Cognitive impairments: Awareness, Sequencing, Safety/Judgement                       PT - Cognition Comments: poor  command follow, poor understanding of need for adherance to weightbearing orders Following commands: Intact      Cueing Cueing Techniques: Verbal cues, Gestural cues, Tactile cues     General Comments General comments (skin integrity,  edema, etc.): patients wife present      Pertinent Vitals/Pain Pain Assessment Pain Assessment: Faces Faces Pain Scale: Hurts little more Pain Location: Right great toe with use for mobility Pain Descriptors / Indicators: Discomfort, Grimacing, Guarding, Sore Pain Intervention(s): Monitored during session    Home Living                          Prior Function            PT Goals (current goals can now be found in the care plan section) Acute Rehab PT Goals PT Goal Formulation: With patient/family Time For Goal Achievement: 12/28/23 Potential to Achieve Goals: Good Progress towards PT goals: Progressing toward goals    Frequency    Min 1X/week           Co-evaluation PT/OT/SLP Co-Evaluation/Treatment: Yes Reason for Co-Treatment: Complexity of the patient's impairments (multi-system involvement);For patient/therapist safety;To address functional/ADL transfers PT goals addressed during session: Mobility/safety with mobility;Balance;Proper use of DME OT goals addressed during session: ADL's and self-care;Strengthening/ROM      AM-PAC PT "6 Clicks" Mobility   Outcome Measure  Help needed turning from your back to your side while in a flat bed without using bedrails?: Total Help needed moving from lying on your back to sitting on the side of a flat bed without using bedrails?: Total Help needed moving to and from a bed to a chair (including a wheelchair)?: Total Help needed standing up from a chair using your arms (e.g., wheelchair or bedside chair)?: Total Help needed to walk in hospital room?: Total Help needed climbing 3-5 steps with a railing? : Total 6 Click Score: 6    End of Session Equipment Utilized During Treatment: Gait belt Activity Tolerance: Patient tolerated treatment well Patient left: with call bell/phone within reach;with family/visitor present;in chair Nurse Communication: Mobility status PT Visit Diagnosis: Unsteadiness on feet  (R26.81);Other abnormalities of gait and mobility (R26.89);Muscle weakness (generalized) (M62.81);Difficulty in walking, not elsewhere classified (R26.2);Pain Pain - Right/Left: Left Pain - part of body: Ankle and joints of foot     Time: 7829-5621 PT Time Calculation (min) (ACUTE ONLY): 29 min  Charges:    $Therapeutic Activity: 8-22 mins PT General Charges $$ ACUTE PT VISIT: 1 Visit                     Margie Urbanowicz B. Beverely Risen PT, DPT Acute Rehabilitation Services Please use secure chat or  Call Office [phone]    Elon Alas [facility] 12/17/2023, 2:03 PM

## 2023-12-17 NOTE — Progress Notes (Signed)
 Inpatient Rehab Admissions Coordinator:   Spoke to wife on the phone to update.  I do not have a bed for this patient to admit to CIR today, and will f/u with them tomorrow.   Estill Dooms, PT, DPT Admissions Coordinator [phone] 12/17/23  2:53 PM

## 2023-12-17 NOTE — Progress Notes (Signed)
 Occupational Therapy Treatment Patient Details Name: Julian Fowler MRN: [id number] DOB: [birth year] Today's Date: 12/17/2023   History of present illness 89 y.o. male presents to [facility] hospital on 12/11/2023 from [facility] with NSTEMI and UTI. Of note, pt had a fall on 12/13/23 and received a left fibular fracture. PMH includes HTN, HLD, afib, acoustic neuroma, BPH, asthma, anxiety, ILD, OSA.   OT comments  Patient received in recliner and asking to return to bed. Patient instructed on WB precautions and squat pivot transfers. Patient required max assist +2 for squat pivot transfer with frequent cues for WB precautions. Lateral leaning performed seated on EOB to remove pads. Standing attempted from EOB to RW with use of bed pads and frequent cues for WB precautions and technique. Patient unable to come to complete stand without risk of increasing WB through LLE. Patient assisted back to supine with mod assist +2. Discharge recommendations changed to AIR due to patient will benefit from intensive inpatient follow-up therapy, >3 hours/day to increase independence with transfers and self care. Acute OT to continue to follow to address established goals to facilitate DC to next venue of care.       If plan is discharge home, recommend the following:  A lot of help with walking and/or transfers;A lot of help with bathing/dressing/bathroom   Equipment Recommendations  Other (comment) (defer to next venue of care)    Recommendations for Other Services Rehab consult    Precautions / Restrictions Precautions Precautions: Fall Recall of Precautions/Restrictions: Impaired Precaution/Restrictions Comments: fall on 2/28 with PT Required Braces or Orthoses: Other Brace Other Brace: L posterior LE splint with stirup Restrictions Weight Bearing Restrictions Per Provider Order: Yes LLE Weight Bearing Per Provider Order: Non weight bearing Other Position/Activity Restrictions: L LE posterior splint with  stirrup, with ACE wrap       Mobility Bed Mobility Overal bed mobility: Needs Assistance Bed Mobility: Sit to Supine       Sit to supine: Mod assist, +2 for physical assistance   General bed mobility comments: assistance with BLE and trunk    Transfers Overall transfer level: Needs assistance Equipment used: Rolling walker (2 wheels), None Transfers: Sit to/from Stand, Bed to chair/wheelchair/BSC Sit to Stand: Max assist, +2 physical assistance, From elevated surface   Squat pivot transfers: Max assist, +2 physical assistance       General transfer comment: transfer from recliner to EOB with squat pivot transfer and max assist +2 with frequent cues for WB precautions. Sit to stand attempted from EOB with patient unable to come to complete stand     Balance Overall balance assessment: Needs assistance Sitting-balance support: Feet supported, Single extremity supported, Bilateral upper extremity supported Sitting balance-Leahy Scale: Fair   Postural control: Posterior lean Standing balance support: Bilateral upper extremity supported Standing balance-Leahy Scale: Poor Standing balance comment: unable to stand for extended period of time due to difficulty maintaining WB precautions                           ADL either performed or assessed with clinical judgement   ADL Overall ADL's : Needs assistance/impaired                                       General ADL Comments: focused on transfer to bed and bed mobility    Extremity/Trunk Assessment  Vision       Perception     Praxis     Communication Communication Communication: Impaired Factors Affecting Communication: Hearing impaired   Cognition Arousal: Alert Behavior During Therapy: Impulsive Cognition: No apparent impairments             OT - Cognition Comments: frequent cues for NWB throught LLE                 Following commands: Intact         Cueing   Cueing Techniques: Verbal cues, Gestural cues, Tactile cues  Exercises      Shoulder Instructions       General Comments Patient's wife present during visit    Pertinent Vitals/ Pain       Pain Assessment Pain Assessment: Faces Faces Pain Scale: Hurts little more Pain Location: Right great toe with use for mobility Pain Descriptors / Indicators: Discomfort, Grimacing, Guarding, Sore Pain Intervention(s): Monitored during session, Repositioned  Home Living                                          Prior Functioning/Environment              Frequency  Min 2X/week        Progress Toward Goals  OT Goals(current goals can now be found in the care plan section)  Progress towards OT goals: Progressing toward goals  Acute Rehab OT Goals Patient Stated Goal: to get in and out of bed without help OT Goal Formulation: With patient/family Time For Goal Achievement: 12/28/23 Potential to Achieve Goals: Good ADL Goals Pt Will Perform Upper Body Bathing: with set-up;with supervision;sitting Pt Will Perform Lower Body Bathing: with min assist;sit to/from stand Pt Will Transfer to Toilet: with mod assist;bedside commode;stand pivot transfer Additional ADL Goal #1: Bed mobility with min A to increase independence with ADL tasks  Plan      Co-evaluation    PT/OT/SLP Co-Evaluation/Treatment: Yes Reason for Co-Treatment: Complexity of the patient's impairments (multi-system involvement);For patient/therapist safety;To address functional/ADL transfers PT goals addressed during session: Mobility/safety with mobility;Balance;Proper use of DME OT goals addressed during session: ADL's and self-care;Strengthening/ROM      AM-PAC OT "6 Clicks" Daily Activity     Outcome Measure   Help from another person eating meals?: None Help from another person taking care of personal grooming?: A Little Help from another person toileting, which includes using  toliet, bedpan, or urinal?: Total Help from another person bathing (including washing, rinsing, drying)?: Total Help from another person to put on and taking off regular upper body clothing?: A Lot Help from another person to put on and taking off regular lower body clothing?: Total 6 Click Score: 12    End of Session Equipment Utilized During Treatment: Gait belt;Rolling walker (2 wheels)  OT Visit Diagnosis: Other abnormalities of gait and mobility (R26.89);Pain;Muscle weakness (generalized) (M62.81) Pain - Right/Left: Right Pain - part of body: Ankle and joints of foot   Activity Tolerance Patient tolerated treatment well   Patient Left in bed;with call bell/phone within reach;with bed alarm set;with family/visitor present   Nurse Communication Mobility status        Time: 1610-9604 OT Time Calculation (min): 30 min  Charges: OT General Charges $OT Visit: 1 Visit OT Treatments $Therapeutic Activity: 8-22 mins  Alfonse Flavors, OTA Acute Rehabilitation Services  Office [phone]   Johnnay Pleitez  Jeannett Senior 12/17/2023, 1:27 PM

## 2023-12-18 ENCOUNTER — Other Ambulatory Visit: Payer: Self-pay | Admitting: Orthopedic Surgery

## 2023-12-18 DIAGNOSIS — S82852A Displaced trimalleolar fracture of left lower leg, initial encounter for closed fracture: Secondary | ICD-10-CM

## 2023-12-18 DIAGNOSIS — G9341 Metabolic encephalopathy: Secondary | ICD-10-CM

## 2023-12-18 DIAGNOSIS — I214 Non-ST elevation (NSTEMI) myocardial infarction: Secondary | ICD-10-CM | POA: Diagnosis not present

## 2023-12-18 DIAGNOSIS — E66812 Obesity, class 2: Secondary | ICD-10-CM | POA: Insufficient documentation

## 2023-12-18 DIAGNOSIS — E871 Hypo-osmolality and hyponatremia: Secondary | ICD-10-CM | POA: Diagnosis not present

## 2023-12-18 DIAGNOSIS — I4821 Permanent atrial fibrillation: Secondary | ICD-10-CM | POA: Diagnosis not present

## 2023-12-18 LAB — CBC
HCT: 33.4 % — ABNORMAL LOW (ref 39.0–52.0)
Hemoglobin: 11.6 g/dL — ABNORMAL LOW (ref 13.0–17.0)
MCH: 30.4 pg (ref 26.0–34.0)
MCHC: 34.7 g/dL (ref 30.0–36.0)
MCV: 87.4 fL (ref 80.0–100.0)
Platelets: 382 10*3/uL (ref 150–400)
RBC: 3.82 MIL/uL — ABNORMAL LOW (ref 4.22–5.81)
RDW: 13.8 % (ref 11.5–15.5)
WBC: 11.5 10*3/uL — ABNORMAL HIGH (ref 4.0–10.5)
nRBC: 0 % (ref 0.0–0.2)

## 2023-12-18 MED ORDER — LORAZEPAM 0.5 MG PO TABS
0.5000 mg | ORAL_TABLET | Freq: Three times a day (TID) | ORAL | Status: DC | PRN
  Administered 2023-12-18 – 2023-12-19 (×2): 0.5 mg via ORAL
  Filled 2023-12-18 (×2): qty 1

## 2023-12-18 MED ORDER — MAGNESIUM SULFATE 2 GM/50ML IV SOLN
2.0000 g | Freq: Once | INTRAVENOUS | Status: AC
  Administered 2023-12-18: 2 g via INTRAVENOUS
  Filled 2023-12-18: qty 50

## 2023-12-18 MED ORDER — HEPARIN SODIUM (PORCINE) 5000 UNIT/ML IJ SOLN
5000.0000 [IU] | Freq: Three times a day (TID) | INTRAMUSCULAR | Status: DC
  Administered 2023-12-18 – 2023-12-19 (×3): 5000 [IU] via SUBCUTANEOUS
  Filled 2023-12-18 (×3): qty 1

## 2023-12-18 NOTE — Assessment & Plan Note (Signed)
 12-18-2023 on [facility]paflo

## 2023-12-18 NOTE — Progress Notes (Signed)
 Inpatient Rehab Admissions Coordinator:   Met with patient and spouse at bedside.  They continue to have questions for ortho regarding plan for LLE.  I've left a message with the office to try and get some direction.  I will not proceed with admission until we have guidance from ortho.  Pt/spouse are in agreement.  Will follow.   Estill Dooms, PT, DPT Admissions Coordinator [phone] 12/18/23  12:00 PM

## 2023-12-18 NOTE — Hospital Course (Addendum)
 HPI: Julian Fowler is a 89 y.o. male with medical history significant of hypertension, hyperlipidemia, GERD, A-fib, cerebral vascular AV fistula, acoustic neuroma, BPH, asthma, anxiety, ILD, asbestosis, OSA who is presenting with NSTEMI from outside hospital.   Patient initially presented to outside hospital on 2/19.  Patient presented with severely elevated blood pressure and complaints of fogginess, dizziness, lightheadedness.  Was started on treatment for hypertensive emergency including nitroglycerin infusion.  He was weaned off this to p.o. medications.   Course was complicated by some agitation requiring Haldol.  Thought possibly secondary to sundowning, had a negative MRI.  Urinalysis suspicious for UTI as possible contributing factor.  Started on ceftriaxone.   Developed significant chest pain yesterday 2/25.  EKG showed evidence of ST depression diffusely and troponin trend was 1800, 2200.  He had already had echocardiogram done there but they were unable to do catheterization.   We were consulted to be primary team while cardiology was consulted to evaluate and treat for NSTEMI upon arrival.  Patient accepted and arrived to the floor 2/26.   Course: Vital signs per chart review on day of transfer were stable other than blood pressure in the 140s.  Lab workup on 2/25 showed sodium 132 on BMP but otherwise within normal limits.  CBC showed hemoglobin 12.2.  Troponin continue to elevate with overall trend being 1800, 2200, 5200, 7500.  Urinalysis done on 2/24 also showed nitrates and leukocytes.  Today should be day 3 of ceftriaxone, will try to determine if he has received his dose for today.  Significant Events: Admitted 12/11/2023 for NSTEMI   Significant Labs: Troponin I 7237 Na 130, K 4.2, CO2 of 23, BUN 10, Scr 0.79, glu 116 WBC 8.1, HgB 12.2, Plt 398  Significant Imaging Studies: Left ankle XR Oblique fibular fracture as described. Findings suspicious for avulsion from the tip  of the medial malleolus. Left tib/fib XR  Mildly displaced distal fibular fracture. No significant change in alignment from yesterday's ankle exam.  . No additional fracture of the lower leg. Right foot XR Dorsal soft tissue edema. 2. No acute fracture or subluxation. 3. Moderate to advanced arthropathy of the first metatarsophalangeal joint, stable from prior. 4. Hammertoe deformity of the toes. CT left ankle Trimalleolar fracture of the left ankle as described. The distal fibular component is mildly displaced. The tibial components are nondisplaced. 2. No widening of the ankle mortise or evidence of tarsal bone fracture. 3. The ankle tendons appear intact without  entrapment in the fractures. CT right foot No definite evidence of acute fracture or dislocation in the right foot. Minimal irregularity of the base of the 5th metatarsal without definite fracture. Correlate with point tenderness and consider radiographic follow-up. 2. Diffuse dorsal subcutaneous swelling without evidence of focal fluid collection, foreign body or soft tissue emphysema. 3. Scattered degenerative changes, most advanced at the 1st metatarsophalangeal joint.  Antibiotic Therapy: Anti-infectives (From admission, onward)    Start     Dose/Rate Route Frequency Ordered Stop   12/16/23 1000  cefTRIAXone (ROCEPHIN) 1 g in sodium chloride 0.9 % 100 mL IVPB  Status:  Discontinued        1 g 200 mL/hr over 30 Minutes Intravenous Every 24 hours 12/16/23 0847 12/16/23 1642   12/12/23 1000  cefTRIAXone (ROCEPHIN) 1 g in sodium chloride 0.9 % 100 mL IVPB  Status:  Discontinued        1 g 200 mL/hr over 30 Minutes Intravenous Every 24 hours 12/11/23 1455 12/16/23 0847  Procedures:   Consultants: Cards ortho

## 2023-12-18 NOTE — Assessment & Plan Note (Signed)
 Prior to 12-18-2023: Initially admitted at [facility] for HTN emergency and treated with IV nitro.  Troponin peaked at 7551. TTE with LVEF of 50 to 55% and no RWMA.  Evaluated by cardiology. -Cardiology recommended Plavix monotherapy at least for 1 months (preferably for 3 months) followed by low-dose aspirin. -Continue Imdur, statin  12-18-2023 per cardiology notes, medical management chosen for his NSTEMI. F/u with cards in clinic. Cards will plan for 3 months Plavix (without load) and then convert to aspirin 81 mg daily.

## 2023-12-18 NOTE — Assessment & Plan Note (Signed)
 12-18-2023 on pravachol.

## 2023-12-18 NOTE — Assessment & Plan Note (Signed)
 Prior to 12-18-2023 -Rate controlled. -Not on anticoagulation due to history of falls and skin tears

## 2023-12-18 NOTE — Assessment & Plan Note (Signed)
 12-18-2023 on protonix

## 2023-12-18 NOTE — Progress Notes (Signed)
 PROGRESS NOTE    Julian Fowler  EXB:[id number] DOB: [birth year] DOA: 12/11/2023 PCP: Julian Fowler  Subjective: Pt seen and examined. Wife at bedside. Talked with orthopedics. Pt not a surgical candidate right now due to recent NSTEMI. Left ankle in posterior splint. Pt stable for DC to CIR.   Hospital Course: HPI: Julian Fowler is a 89 y.o. male with medical history significant of hypertension, hyperlipidemia, GERD, A-fib, cerebral vascular AV fistula, acoustic neuroma, BPH, asthma, anxiety, ILD, asbestosis, OSA who is presenting with NSTEMI from outside hospital.   Patient initially presented to outside hospital on 2/19.  Patient presented with severely elevated blood pressure and complaints of fogginess, dizziness, lightheadedness.  Was started on treatment for hypertensive emergency including nitroglycerin infusion.  He was weaned off this to p.o. medications.   Course was complicated by some agitation requiring Haldol.  Thought possibly secondary to sundowning, had a negative MRI.  Urinalysis suspicious for UTI as possible contributing factor.  Started on ceftriaxone.   Developed significant chest pain yesterday 2/25.  EKG showed evidence of ST depression diffusely and troponin trend was 1800, 2200.  He had already had echocardiogram done there but they were unable to do catheterization.   We were consulted to be primary team while cardiology was consulted to evaluate and treat for NSTEMI upon arrival.  Patient accepted and arrived to the floor 2/26.   Course: Vital signs per chart review on day of transfer were stable other than blood pressure in the 140s.  Lab workup on 2/25 showed sodium 132 on BMP but otherwise within normal limits.  CBC showed hemoglobin 12.2.  Troponin continue to elevate with overall trend being 1800, 2200, 5200, 7500.  Urinalysis done on 2/24 also showed nitrates and leukocytes.  Today should be day 3 of ceftriaxone, will try to determine if he has  received his dose for today.  Significant Events: Admitted 12/11/2023 for NSTEMI   Significant Labs: Troponin I 7237 Na 130, K 4.2, CO2 of 23, BUN 10, Scr 0.79, glu 116 WBC 8.1, HgB 12.2, Plt 398  Significant Imaging Studies: Left ankle XR Oblique fibular fracture as described. Findings suspicious for avulsion from the tip of the medial malleolus. Left tib/fib XR  Mildly displaced distal fibular fracture. No significant change in alignment from yesterday's ankle exam.  . No additional fracture of the lower leg. Right foot XR Dorsal soft tissue edema. 2. No acute fracture or subluxation. 3. Moderate to advanced arthropathy of the first metatarsophalangeal joint, stable from prior. 4. Hammertoe deformity of the toes. CT left ankle Trimalleolar fracture of the left ankle as described. The distal fibular component is mildly displaced. The tibial components are nondisplaced. 2. No widening of the ankle mortise or evidence of tarsal bone fracture. 3. The ankle tendons appear intact without  entrapment in the fractures. CT right foot No definite evidence of acute fracture or dislocation in the right foot. Minimal irregularity of the base of the 5th metatarsal without definite fracture. Correlate with point tenderness and consider radiographic follow-up. 2. Diffuse dorsal subcutaneous swelling without evidence of focal fluid collection, foreign body or soft tissue emphysema. 3. Scattered degenerative changes, most advanced at the 1st metatarsophalangeal joint.  Antibiotic Therapy: Anti-infectives (From admission, onward)    Start     Dose/Rate Route Frequency Ordered Stop   12/16/23 1000  cefTRIAXone (ROCEPHIN) 1 g in sodium chloride 0.9 % 100 mL IVPB  Status:  Discontinued        1 g  200 mL/hr over 30 Minutes Intravenous Every 24 hours 12/16/23 0847 12/16/23 1642   12/12/23 1000  cefTRIAXone (ROCEPHIN) 1 g in sodium chloride 0.9 % 100 mL IVPB  Status:  Discontinued        1 g 200 mL/hr over 30  Minutes Intravenous Every 24 hours 12/11/23 1455 12/16/23 0847       Procedures:   Consultants: Cards ortho    Assessment and Plan: * NSTEMI (non-ST elevated myocardial infarction) (HCC) Prior to 12-18-2023: Initially admitted at [facility] for HTN emergency and treated with IV nitro.  Troponin peaked at 7551. TTE with LVEF of 50 to 55% and no RWMA.  Evaluated by cardiology. -Cardiology recommended Plavix monotherapy at least for 1 months (preferably for 3 months) followed by low-dose aspirin. -Continue Imdur, statin  12-18-2023 per cardiology notes, medical management chosen for his NSTEMI. F/u with cards in clinic. Cards will plan for 3 months Plavix (without load) and then convert to aspirin 81 mg daily.   Permanent atrial fibrillation (HCC) - Not on anticoagulation due to history of falls and skin tears Prior to 12-18-2023 -Rate controlled. -Not on anticoagulation due to history of falls and skin tears  Left trimalleolar fracture 12-18-2023 appears to have sustained this during a physical therapy session on 12-13-2023. Good alignment on xrays. Discussed with Dr. Victorino Dike with ortho. Pt with recent NSTEMI. No hurry for ORIF. ORIF would not change his weight bearing status. Pt remains NWB. Decision made not to proceed with ORIF. Pt will need outpatient f/u with orthopedics.  Acute metabolic encephalopathy Prior to 12-18-2023: -Likely worsened due to UTI, hyponatremia, sodium was 119 at [facility]. -Completed IV Rocephin. -Currently alert and oriented, appears at baseline.. -Continue Seroquel (will need prescription at discharge)  12-18-2023 has resolved.  Hyponatremia Prior to 12-18-2023: Na 119 at [facility], has been on salt tablets.  Per family, he continues to do these as he was hospitalized with severe hyponatremia in the past -Stable, continue salt tabs  12-18-2023 stable. Na 129. Continue with salt tabs  UTI (urinary tract infection) -Has completed IV Rocephin 2/24-3/3 -Prior history of recurrent  UTIs, urine cultures have shown E. coli in the past  Benign prostatic hyperplasia with urinary obstruction 12-18-2023 on rapaflo  ILD (interstitial lung disease) (HCC) 12-18-2023 stable.  OSA (obstructive sleep apnea) 12-18-2023 stable.  Essential hypertension 12-18-2023 on aldactone, imdur, avapro, hydralazine,norvasc.  Hyperlipidemia 12-18-2023 on pravachol.  Anxiety 12-18-2023 prn ativan for anxiety.  GERD 12-18-2023 on protonix  Obesity, Class II, BMI 35-39.9 Estimated body mass index is 35.86 kg/m as calculated from the following:   Height as of this encounter: 5\' 9"  (1.753 m).   Weight as of this encounter: 110.1 kg.   DVT prophylaxis:   SCDs   Code Status: Full Code Family Communication: discussed with pt and wife Disposition Plan: home vs SNF vs CIR Reason for continuing need for hospitalization: medically stable for DC  Objective: Vitals:   12/17/23 2125 12/18/23 0517 12/18/23 0929 12/18/23 1149  BP: (!) 141/77 (!) 148/86  (!) 118/56  Pulse:  97  99  Resp:  20 15 16   Temp:  98 F (36.7 C) 98.4 F (36.9 C) 98.2 F (36.8 C)  TempSrc:  Oral Oral Oral  SpO2:  96%  93%  Weight:      Height:        Intake/Output Summary (Last 24 hours) at 12/18/2023 1544 Last data filed at 12/18/2023 1200 Gross per 24 hour  Intake 960 ml  Output 2200 ml  Net -1240 ml   Filed Weights   12/11/23 1254 12/11/23 1312 12/12/23 0306  Weight: 108 kg 108 kg 110.1 kg    Examination:  Physical Exam Vitals and nursing note reviewed.  Constitutional:      General: He is not in acute distress.    Appearance: He is obese. He is not toxic-appearing.  HENT:     Head: Normocephalic and atraumatic.  Eyes:     General: No scleral icterus. Cardiovascular:     Rate and Rhythm: Normal rate and regular rhythm.  Pulmonary:     Effort: Pulmonary effort is normal.     Breath sounds: Normal breath sounds.  Abdominal:     General: Abdomen is protuberant. Bowel sounds are normal.      Palpations: Abdomen is soft.  Musculoskeletal:     Comments: Left LE in posterior splint.  Skin:    Capillary Refill: Capillary refill takes less than 2 seconds.  Neurological:     Mental Status: He is oriented to person, place, and time.     Data Reviewed: I have personally reviewed following labs and imaging studies  CBC: Recent Labs  Lab 12/14/23 0436 12/15/23 0348 12/16/23 0512 12/17/23 0623 12/18/23 0443  WBC 12.8* 13.5* 13.2* 9.3 11.5*  HGB 12.0* 11.2* 11.6* 10.8* 11.6*  HCT 34.8* 32.7* 33.8* 31.3* 33.4*  MCV 87.7 87.2 88.9 87.7 87.4  PLT 390 351 310 359 382   Basic Metabolic Panel: Recent Labs  Lab 12/13/23 0453 12/14/23 0436 12/15/23 0348 12/16/23 0512 12/17/23 0623  NA 131* 128* 128* 127* 129*  K 3.7 4.3 3.7 3.7 4.0  CL 98 96* 94* 95* 96*  CO2 24 27 23 22 23   GLUCOSE 97 98 95 94 88  BUN 7* 7* 11 13 11   CREATININE 0.74 0.76 0.85 0.77 0.79  CALCIUM 8.4* 8.3* 8.3* 8.4* 8.2*  MG  --   --   --   --  1.5*  PHOS 3.9 3.4 3.5 3.7 3.7   GFR: Estimated Creatinine Clearance: 81.1 mL/min (by C-G formula based on SCr of 0.79 mg/dL). Liver Function Tests: Recent Labs  Lab 12/13/23 0453 12/14/23 0436 12/15/23 0348 12/16/23 0512 12/17/23 0623  ALBUMIN 2.4* 2.6* 2.3* 2.4* 2.4*    Scheduled Meds:  amLODipine  10 mg Oral Daily   clopidogrel  75 mg Oral Daily   diclofenac Sodium  2 g Topical QID   guaiFENesin  1,200 mg Oral BID   hydrALAZINE  100 mg Oral TID   irbesartan  150 mg Oral Daily   isosorbide mononitrate  60 mg Oral Daily   mometasone-formoterol  2 puff Inhalation BID   pantoprazole  40 mg Oral Daily   pravastatin  40 mg Oral QHS   QUEtiapine  50 mg Oral QHS   sodium chloride  1 g Oral TID WC   spironolactone  25 mg Oral Daily   Continuous Infusions:   LOS: 7 days   Time spent: 40 minutes  Carollee Herter, DO  [facility]  12/18/2023, 3:44 PM

## 2023-12-18 NOTE — Plan of Care (Signed)
  Problem: Education: Goal: Knowledge of General Education information will improve Description: Including pain rating scale, medication(s)/side effects and non-pharmacologic comfort measures Outcome: Progressing   Problem: Health Behavior/Discharge Planning: Goal: Ability to manage health-related needs will improve Outcome: Progressing   Problem: Clinical Measurements: Goal: Ability to maintain clinical measurements within normal limits will improve Outcome: Progressing Goal: Will remain free from infection Outcome: Progressing Goal: Diagnostic test results will improve Outcome: Progressing Goal: Respiratory complications will improve Outcome: Progressing Goal: Cardiovascular complication will be avoided Outcome: Progressing   Problem: Activity: Goal: Risk for activity intolerance will decrease Outcome: Progressing   Problem: Nutrition: Goal: Adequate nutrition will be maintained Outcome: Progressing   Problem: Coping: Goal: Level of anxiety will decrease Outcome: Progressing   Problem: Elimination: Goal: Will not experience complications related to bowel motility Outcome: Progressing Goal: Will not experience complications related to urinary retention Outcome: Progressing   Problem: Pain Managment: Goal: General experience of comfort will improve and/or be controlled Outcome: Progressing   Problem: Safety: Goal: Ability to remain free from injury will improve Outcome: Progressing   Problem: Skin Integrity: Goal: Risk for impaired skin integrity will decrease Outcome: Progressing   Problem: Education: Goal: Knowledge of disease or condition will improve Outcome: Progressing Goal: Understanding of medication regimen will improve Outcome: Progressing Goal: Individualized Educational Video(s) Outcome: Progressing   Problem: Activity: Goal: Ability to tolerate increased activity will improve Outcome: Progressing   Problem: Cardiac: Goal: Ability to achieve  and maintain adequate cardiopulmonary perfusion will improve Outcome: Progressing   Problem: Health Behavior/Discharge Planning: Goal: Ability to safely manage health-related needs after discharge will improve Outcome: Progressing   Problem: Education: Goal: Understanding of cardiac disease, CV risk reduction, and recovery process will improve Outcome: Progressing Goal: Individualized Educational Video(s) Outcome: Progressing   Problem: Activity: Goal: Ability to tolerate increased activity will improve Outcome: Progressing   Problem: Cardiac: Goal: Ability to achieve and maintain adequate cardiovascular perfusion will improve Outcome: Progressing   Problem: Health Behavior/Discharge Planning: Goal: Ability to safely manage health-related needs after discharge will improve Outcome: Progressing   Problem: Education: Goal: Understanding of CV disease, CV risk reduction, and recovery process will improve Outcome: Progressing Goal: Individualized Educational Video(s) Outcome: Progressing   Problem: Activity: Goal: Ability to return to baseline activity level will improve Outcome: Progressing   Problem: Cardiovascular: Goal: Ability to achieve and maintain adequate cardiovascular perfusion will improve Outcome: Progressing Goal: Vascular access site(s) Level 0-1 will be maintained Outcome: Progressing   Problem: Health Behavior/Discharge Planning: Goal: Ability to safely manage health-related needs after discharge will improve Outcome: Progressing

## 2023-12-18 NOTE — TOC Progression Note (Signed)
 Transition of Care [facility]) - Progression Note    Patient Details  Name: Julian Fowler MRN: [id number] Date of Birth: 1935/10/08  Transition of Care [facility]) CM/SW Contact  Graves-Bigelow, Lamar Laundry, RN Phone Number: 12/18/2023, 2:38 PM  Clinical Narrative: CIR continues to follow; however is trying to reach ortho due to family has questions. Case Manager continues to follow for additional transition of care needs as the patient progresses.     Expected Discharge Plan: IP Rehab Facility Barriers to Discharge: Continued Medical Work up  Expected Discharge Plan and Services   Discharge Planning Services: CM Consult Post Acute Care Choice: Home Health Living arrangements for the past 2 months: Single Family Home    HH Arranged: PT HH Agency: [facility] Date [facility] Agency Contacted: 12/13/23 Time HH Agency Contacted: 1622 Representative spoke with at [facility] Agency: Elnita Maxwell   Social Determinants of Health (SDOH) Interventions SDOH Screenings   Food Insecurity: No Food Insecurity (12/11/2023)  Housing: Low Risk  (12/11/2023)  Transportation Needs: No Transportation Needs (12/11/2023)  Utilities: Not At Risk (12/11/2023)  Financial Resource Strain: Low Risk  (12/05/2023)   Received from [facility]  Physical Activity: Inactive (12/05/2023)   Received from [facility]  Social Connections: Socially Integrated (12/11/2023)  Stress: No Stress Concern Present (12/05/2023)   Received from [facility]  Tobacco Use: Medium Risk (12/12/2023)  Health Literacy: Low Risk  (12/05/2023)   Received from [facility]    Readmission Risk Interventions     No data to display

## 2023-12-18 NOTE — Assessment & Plan Note (Signed)
 Prior to 12-18-2023: -Likely worsened due to UTI, hyponatremia, sodium was 119 at [facility]. -Completed IV Rocephin. -Currently alert and oriented, appears at baseline.. -Continue Seroquel (will need prescription at discharge)  12-18-2023 has resolved.

## 2023-12-18 NOTE — Assessment & Plan Note (Signed)
 12-18-2023 stable.

## 2023-12-18 NOTE — Assessment & Plan Note (Signed)
 12-18-2023 on aldactone, imdur, avapro, hydralazine,norvasc.

## 2023-12-18 NOTE — Assessment & Plan Note (Signed)
-  Has completed IV Rocephin 2/24-3/3 -Prior history of recurrent UTIs, urine cultures have shown E. coli in the past

## 2023-12-18 NOTE — Assessment & Plan Note (Signed)
 12-18-2023 appears to have sustained this during a physical therapy session on 12-13-2023. Good alignment on xrays. Discussed with Dr. Victorino Dike with ortho. Pt with recent NSTEMI. No hurry for ORIF. ORIF would not change his weight bearing status. Pt remains NWB. Decision made not to proceed with ORIF. Pt will need outpatient f/u with orthopedics.

## 2023-12-18 NOTE — Plan of Care (Signed)
  Problem: Clinical Measurements: Goal: Respiratory complications will improve Outcome: Progressing Goal: Cardiovascular complication will be avoided Outcome: Progressing   Problem: Nutrition: Goal: Adequate nutrition will be maintained Outcome: Progressing   Problem: Pain Managment: Goal: General experience of comfort will improve and/or be controlled Outcome: Progressing   Problem: Safety: Goal: Ability to remain free from injury will improve Outcome: Progressing   Problem: Cardiac: Goal: Ability to achieve and maintain adequate cardiopulmonary perfusion will improve Outcome: Progressing   Problem: Cardiovascular: Goal: Ability to achieve and maintain adequate cardiovascular perfusion will improve Outcome: Progressing Goal: Vascular access site(s) Level 0-1 will be maintained Outcome: Progressing

## 2023-12-18 NOTE — Subjective & Objective (Signed)
 Pt seen and examined. Wife at bedside. Talked with orthopedics. Pt not a surgical candidate right now due to recent NSTEMI. Left ankle in posterior splint. Pt stable for DC to CIR.

## 2023-12-18 NOTE — Assessment & Plan Note (Signed)
 12-18-2023 prn ativan for anxiety.

## 2023-12-18 NOTE — Progress Notes (Addendum)
 Subjective: 89 y/o male with left ankle trimal fracture.  Pt has not been able to discharge home.  I spoke to him and his wife by phone today.  He was to f/u with me as an outpatient for management of these injuries.  He is pending transfer to CIR.  I spoke with Dr. Imogene Burn by phone.  Pt has a h/o MI a few days ago.  He's on Plavix.  Objective: Vital signs in last 24 hours: Temp:  [97.6 F (36.4 C)-98.4 F (36.9 C)] 98.2 F (36.8 C) (03/05 1149) Pulse Rate:  [85-99] 99 (03/05 1149) Resp:  [15-20] 16 (03/05 1149) BP: (118-148)/(56-87) 118/56 (03/05 1149) SpO2:  [93 %-98 %] 93 % (03/05 1149)  Intake/Output from previous day: 03/04 0701 - 03/05 0700 In: 956 [P.O.:956] Out: 2400 [Urine:2400] Intake/Output this shift: Total I/O In: 480 [P.O.:480] Out: 600 [Urine:600]  Recent Labs    12/16/23 0512 12/17/23 0623 12/18/23 0443  HGB 11.6* 10.8* 11.6*   Recent Labs    12/17/23 0623 12/18/23 0443  WBC 9.3 11.5*  RBC 3.57* 3.82*  HCT 31.3* 33.4*  PLT 359 382   Recent Labs    12/16/23 0512 12/17/23 0623  NA 127* 129*  K 3.7 4.0  CL 95* 96*  CO2 22 23  BUN 13 11  CREATININE 0.77 0.79  GLUCOSE 94 88  CALCIUM 8.4* 8.2*   No results for input(s): "LABPT", "INR" in the last 72 hours.  PE:      Assessment/Plan: L ankle trimal fracture - I explained the nature of the injury in detail.  The fracture is in acceptable alignment in the splint.  Given his recent MI, I believe it's safest to manage this fracture in a splint followed by a cast.  He will continue to be NWB on the L.  He can bear weight as tolerated on the right.  F/u with me in the office in two weeks for repeat xrays.  No surgery tomorrow.  I spoke with his wife and informed her of the plan.     Toni Arthurs 12/18/2023, 12:15 PM

## 2023-12-18 NOTE — Progress Notes (Signed)
 Inpatient Rehab Admissions Coordinator:   Met with pt and family again at bedside.  Per spouses discussion with ortho they were planning for ORIF of L ankle fx tomorrow, but then decided against it due to risk from recent NSTEMI.  Dr. Victorino Dike planning to meet with pt/family at bedside sometime this afternoon to discuss with them.  I will f/u with them tomorrow for potential admit.    Estill Dooms, PT, DPT Admissions Coordinator [phone] 12/18/23  3:19 PM

## 2023-12-18 NOTE — Assessment & Plan Note (Signed)
 Estimated body mass index is 35.86 kg/m as calculated from the following:   Height as of this encounter: 5\' 9"  (1.753 m).   Weight as of this encounter: 110.1 kg.

## 2023-12-18 NOTE — Assessment & Plan Note (Signed)
 Prior to 12-18-2023: Na 119 at [facility], has been on salt tablets.  Per family, he continues to do these as he was hospitalized with severe hyponatremia in the past -Stable, continue salt tabs  12-18-2023 stable. Na 129. Continue with salt tabs

## 2023-12-19 ENCOUNTER — Other Ambulatory Visit: Payer: Self-pay

## 2023-12-19 ENCOUNTER — Encounter (HOSPITAL_COMMUNITY): Payer: Self-pay | Admitting: Physical Medicine and Rehabilitation

## 2023-12-19 ENCOUNTER — Encounter (HOSPITAL_COMMUNITY): Admission: EM | Payer: Self-pay | Source: Other Acute Inpatient Hospital | Attending: Internal Medicine

## 2023-12-19 ENCOUNTER — Inpatient Hospital Stay (HOSPITAL_COMMUNITY)
Admission: AD | Admit: 2023-12-19 | Discharge: 2023-12-21 | DRG: 945 | Disposition: A | Discharge status: Other Acute Inpatient Hospital | Source: Intra-hospital | Attending: Physical Medicine and Rehabilitation | Admitting: Physical Medicine and Rehabilitation

## 2023-12-19 DIAGNOSIS — Z91048 Other nonmedicinal substance allergy status: Secondary | ICD-10-CM

## 2023-12-19 DIAGNOSIS — R5381 Other malaise: Secondary | ICD-10-CM | POA: Diagnosis not present

## 2023-12-19 DIAGNOSIS — Z7951 Long term (current) use of inhaled steroids: Secondary | ICD-10-CM

## 2023-12-19 DIAGNOSIS — G4700 Insomnia, unspecified: Secondary | ICD-10-CM | POA: Diagnosis present

## 2023-12-19 DIAGNOSIS — Z888 Allergy status to other drugs, medicaments and biological substances status: Secondary | ICD-10-CM

## 2023-12-19 DIAGNOSIS — S82852D Displaced trimalleolar fracture of left lower leg, subsequent encounter for closed fracture with routine healing: Secondary | ICD-10-CM

## 2023-12-19 DIAGNOSIS — D333 Benign neoplasm of cranial nerves: Secondary | ICD-10-CM | POA: Diagnosis present

## 2023-12-19 DIAGNOSIS — R0603 Acute respiratory distress: Secondary | ICD-10-CM | POA: Diagnosis not present

## 2023-12-19 DIAGNOSIS — K219 Gastro-esophageal reflux disease without esophagitis: Secondary | ICD-10-CM | POA: Diagnosis present

## 2023-12-19 DIAGNOSIS — Z7902 Long term (current) use of antithrombotics/antiplatelets: Secondary | ICD-10-CM | POA: Diagnosis not present

## 2023-12-19 DIAGNOSIS — N400 Enlarged prostate without lower urinary tract symptoms: Secondary | ICD-10-CM | POA: Diagnosis present

## 2023-12-19 DIAGNOSIS — Z9181 History of falling: Secondary | ICD-10-CM

## 2023-12-19 DIAGNOSIS — Z820 Family history of epilepsy and other diseases of the nervous system: Secondary | ICD-10-CM

## 2023-12-19 DIAGNOSIS — I214 Non-ST elevation (NSTEMI) myocardial infarction: Secondary | ICD-10-CM | POA: Diagnosis present

## 2023-12-19 DIAGNOSIS — Z87891 Personal history of nicotine dependence: Secondary | ICD-10-CM | POA: Diagnosis not present

## 2023-12-19 DIAGNOSIS — Z79899 Other long term (current) drug therapy: Secondary | ICD-10-CM

## 2023-12-19 DIAGNOSIS — J61 Pneumoconiosis due to asbestos and other mineral fibers: Secondary | ICD-10-CM | POA: Diagnosis present

## 2023-12-19 DIAGNOSIS — F419 Anxiety disorder, unspecified: Secondary | ICD-10-CM | POA: Diagnosis present

## 2023-12-19 DIAGNOSIS — J849 Interstitial pulmonary disease, unspecified: Secondary | ICD-10-CM | POA: Diagnosis not present

## 2023-12-19 DIAGNOSIS — J9601 Acute respiratory failure with hypoxia: Secondary | ICD-10-CM | POA: Diagnosis not present

## 2023-12-19 DIAGNOSIS — J4489 Other specified chronic obstructive pulmonary disease: Secondary | ICD-10-CM | POA: Diagnosis present

## 2023-12-19 DIAGNOSIS — Z96653 Presence of artificial knee joint, bilateral: Secondary | ICD-10-CM | POA: Diagnosis present

## 2023-12-19 DIAGNOSIS — Z8249 Family history of ischemic heart disease and other diseases of the circulatory system: Secondary | ICD-10-CM | POA: Diagnosis not present

## 2023-12-19 DIAGNOSIS — H9192 Unspecified hearing loss, left ear: Secondary | ICD-10-CM | POA: Diagnosis present

## 2023-12-19 DIAGNOSIS — Z87440 Personal history of urinary (tract) infections: Secondary | ICD-10-CM

## 2023-12-19 DIAGNOSIS — I4821 Permanent atrial fibrillation: Secondary | ICD-10-CM | POA: Diagnosis not present

## 2023-12-19 DIAGNOSIS — M6281 Muscle weakness (generalized): Secondary | ICD-10-CM | POA: Diagnosis present

## 2023-12-19 DIAGNOSIS — I10 Essential (primary) hypertension: Secondary | ICD-10-CM | POA: Diagnosis present

## 2023-12-19 DIAGNOSIS — Z91040 Latex allergy status: Secondary | ICD-10-CM

## 2023-12-19 DIAGNOSIS — R061 Stridor: Secondary | ICD-10-CM | POA: Diagnosis not present

## 2023-12-19 DIAGNOSIS — Z841 Family history of disorders of kidney and ureter: Secondary | ICD-10-CM | POA: Diagnosis not present

## 2023-12-19 DIAGNOSIS — I2510 Atherosclerotic heart disease of native coronary artery without angina pectoris: Secondary | ICD-10-CM | POA: Diagnosis present

## 2023-12-19 DIAGNOSIS — J449 Chronic obstructive pulmonary disease, unspecified: Secondary | ICD-10-CM | POA: Diagnosis not present

## 2023-12-19 DIAGNOSIS — N3281 Overactive bladder: Secondary | ICD-10-CM | POA: Diagnosis present

## 2023-12-19 DIAGNOSIS — Z89022 Acquired absence of left finger(s): Secondary | ICD-10-CM

## 2023-12-19 DIAGNOSIS — G9341 Metabolic encephalopathy: Secondary | ICD-10-CM | POA: Diagnosis not present

## 2023-12-19 DIAGNOSIS — W010XXD Fall on same level from slipping, tripping and stumbling without subsequent striking against object, subsequent encounter: Secondary | ICD-10-CM | POA: Diagnosis present

## 2023-12-19 DIAGNOSIS — E871 Hypo-osmolality and hyponatremia: Secondary | ICD-10-CM | POA: Diagnosis present

## 2023-12-19 DIAGNOSIS — Z885 Allergy status to narcotic agent status: Secondary | ICD-10-CM

## 2023-12-19 DIAGNOSIS — I469 Cardiac arrest, cause unspecified: Secondary | ICD-10-CM | POA: Diagnosis not present

## 2023-12-19 DIAGNOSIS — E785 Hyperlipidemia, unspecified: Secondary | ICD-10-CM | POA: Diagnosis present

## 2023-12-19 DIAGNOSIS — G4733 Obstructive sleep apnea (adult) (pediatric): Secondary | ICD-10-CM | POA: Diagnosis present

## 2023-12-19 DIAGNOSIS — S82432D Displaced oblique fracture of shaft of left fibula, subsequent encounter for closed fracture with routine healing: Secondary | ICD-10-CM

## 2023-12-19 LAB — COMPREHENSIVE METABOLIC PANEL
ALT: 38 U/L (ref 0–44)
AST: 32 U/L (ref 15–41)
Albumin: 2.5 g/dL — ABNORMAL LOW (ref 3.5–5.0)
Alkaline Phosphatase: 59 U/L (ref 38–126)
Anion gap: 8 (ref 5–15)
BUN: 15 mg/dL (ref 8–23)
CO2: 20 mmol/L — ABNORMAL LOW (ref 22–32)
Calcium: 7.7 mg/dL — ABNORMAL LOW (ref 8.9–10.3)
Chloride: 98 mmol/L (ref 98–111)
Creatinine, Ser: 0.86 mg/dL (ref 0.61–1.24)
GFR, Estimated: >60 mL/min (ref >60)
Glucose, Bld: 88 mg/dL (ref 70–99)
Potassium: 4.2 mmol/L (ref 3.5–5.1)
Sodium: 126 mmol/L — ABNORMAL LOW (ref 135–145)
Total Bilirubin: 0.6 mg/dL (ref 0.0–1.2)
Total Protein: 5.6 g/dL — ABNORMAL LOW (ref 6.5–8.1)

## 2023-12-19 LAB — MAGNESIUM: Magnesium: 1.9 mg/dL (ref 1.7–2.4)

## 2023-12-19 SURGERY — OPEN REDUCTION INTERNAL FIXATION (ORIF) ANKLE FRACTURE
Anesthesia: General | Site: Ankle | Laterality: Left

## 2023-12-19 MED ORDER — CLOPIDOGREL BISULFATE 75 MG PO TABS: 75.0000 mg | ORAL_TABLET | Freq: Every day | ORAL | 2 refills | Status: DC

## 2023-12-19 MED ORDER — DICLOFENAC SODIUM 1 % EX GEL
2.0000 g | Freq: Four times a day (QID) | CUTANEOUS | Status: DC
Start: 2023-12-19 — End: 2023-12-21
  Administered 2023-12-19 – 2023-12-20 (×5): 2 g via TOPICAL
  Filled 2023-12-19: qty 100

## 2023-12-19 MED ORDER — NITROGLYCERIN 0.4 MG SL SUBL: 0.4000 mg | SUBLINGUAL_TABLET | SUBLINGUAL | 3 refills | Status: DC | PRN

## 2023-12-19 MED ORDER — ACETAMINOPHEN 325 MG PO TABS
650.0000 mg | ORAL_TABLET | ORAL | Status: DC | PRN
  Administered 2023-12-19 – 2023-12-20 (×2): 650 mg via ORAL
  Filled 2023-12-19 (×2): qty 2

## 2023-12-19 MED ORDER — NITROGLYCERIN 0.4 MG SL SUBL: 0.4000 mg | SUBLINGUAL_TABLET | SUBLINGUAL | Status: DC | PRN

## 2023-12-19 MED ORDER — QUETIAPINE FUMARATE 50 MG PO TABS
50.0000 mg | ORAL_TABLET | Freq: Every day | ORAL | Status: DC
  Administered 2023-12-19 – 2023-12-20 (×2): 50 mg via ORAL
  Filled 2023-12-19 (×2): qty 1

## 2023-12-19 MED ORDER — HEPARIN SODIUM (PORCINE) 5000 UNIT/ML IJ SOLN
5000.0000 [IU] | Freq: Three times a day (TID) | INTRAMUSCULAR | Status: DC
  Administered 2023-12-19 – 2023-12-21 (×5): 5000 [IU] via SUBCUTANEOUS
  Filled 2023-12-19 (×5): qty 1

## 2023-12-19 MED ORDER — IRBESARTAN 150 MG PO TABS
150.0000 mg | ORAL_TABLET | Freq: Every day | ORAL | 3 refills | Status: DC
Start: 2023-12-20 — End: 2023-12-22

## 2023-12-19 MED ORDER — PRAVASTATIN SODIUM 40 MG PO TABS
40.0000 mg | ORAL_TABLET | Freq: Every day | ORAL | Status: DC
  Administered 2023-12-19 – 2023-12-20 (×2): 40 mg via ORAL
  Filled 2023-12-19 (×2): qty 1

## 2023-12-19 MED ORDER — ALBUTEROL SULFATE (2.5 MG/3ML) 0.083% IN NEBU
2.5000 mg | INHALATION_SOLUTION | Freq: Four times a day (QID) | RESPIRATORY_TRACT | Status: DC | PRN
  Filled 2023-12-19: qty 3

## 2023-12-19 MED ORDER — HYDRALAZINE HCL 100 MG PO TABS: 100.0000 mg | ORAL_TABLET | Freq: Three times a day (TID) | ORAL | 3 refills | Status: DC

## 2023-12-19 MED ORDER — AMLODIPINE BESYLATE 10 MG PO TABS
10.0000 mg | ORAL_TABLET | Freq: Every day | ORAL | Status: DC
Start: 2023-12-20 — End: 2023-12-20
  Administered 2023-12-20: 10 mg via ORAL
  Filled 2023-12-19: qty 1

## 2023-12-19 MED ORDER — FLUTICASONE FUROATE-VILANTEROL 200-25 MCG/ACT IN AEPB
1.0000 | INHALATION_SPRAY | Freq: Every day | RESPIRATORY_TRACT | Status: DC
  Administered 2023-12-19 – 2023-12-20 (×2): 1 via RESPIRATORY_TRACT
  Filled 2023-12-19: qty 28

## 2023-12-19 MED ORDER — SPIRONOLACTONE 25 MG PO TABS
25.0000 mg | ORAL_TABLET | Freq: Every day | ORAL | Status: DC
  Administered 2023-12-20 – 2023-12-21 (×2): 25 mg via ORAL
  Filled 2023-12-19 (×2): qty 1

## 2023-12-19 MED ORDER — ISOSORBIDE MONONITRATE ER 60 MG PO TB24: 60.0000 mg | ORAL_TABLET | Freq: Every day | ORAL | 3 refills | Status: DC

## 2023-12-19 MED ORDER — HYDRALAZINE HCL 50 MG PO TABS
100.0000 mg | ORAL_TABLET | Freq: Three times a day (TID) | ORAL | Status: DC
  Administered 2023-12-19 – 2023-12-21 (×4): 100 mg via ORAL
  Filled 2023-12-19 (×5): qty 2

## 2023-12-19 MED ORDER — IRBESARTAN 75 MG PO TABS
150.0000 mg | ORAL_TABLET | Freq: Every day | ORAL | Status: DC
Start: 2023-12-20 — End: 2023-12-21
  Administered 2023-12-20 – 2023-12-21 (×2): 150 mg via ORAL
  Filled 2023-12-19 (×2): qty 2

## 2023-12-19 MED ORDER — SPIRONOLACTONE 25 MG PO TABS: 25.0000 mg | ORAL_TABLET | Freq: Every day | ORAL | 3 refills | Status: DC

## 2023-12-19 MED ORDER — CLOPIDOGREL BISULFATE 75 MG PO TABS
75.0000 mg | ORAL_TABLET | Freq: Every day | ORAL | Status: DC
  Administered 2023-12-20 – 2023-12-21 (×2): 75 mg via ORAL
  Filled 2023-12-19 (×2): qty 1

## 2023-12-19 MED ORDER — AMLODIPINE BESYLATE 10 MG PO TABS: 10.0000 mg | ORAL_TABLET | Freq: Every day | ORAL | 3 refills | Status: DC

## 2023-12-19 MED ORDER — PANTOPRAZOLE SODIUM 40 MG PO TBEC
40.0000 mg | DELAYED_RELEASE_TABLET | Freq: Every day | ORAL | Status: DC
  Administered 2023-12-20 – 2023-12-21 (×2): 40 mg via ORAL
  Filled 2023-12-19 (×2): qty 1

## 2023-12-19 MED ORDER — TRAMADOL HCL 50 MG PO TABS
25.0000 mg | ORAL_TABLET | Freq: Two times a day (BID) | ORAL | Status: DC | PRN
  Administered 2023-12-20: 25 mg via ORAL
  Filled 2023-12-19 (×2): qty 1

## 2023-12-19 MED ORDER — GUAIFENESIN ER 600 MG PO TB12
1200.0000 mg | ORAL_TABLET | Freq: Two times a day (BID) | ORAL | Status: DC
  Administered 2023-12-19 – 2023-12-21 (×4): 1200 mg via ORAL
  Filled 2023-12-19 (×4): qty 2

## 2023-12-19 MED ORDER — LORAZEPAM 0.5 MG PO TABS
0.5000 mg | ORAL_TABLET | Freq: Three times a day (TID) | ORAL | Status: DC | PRN
  Administered 2023-12-20 – 2023-12-21 (×3): 0.5 mg via ORAL
  Filled 2023-12-19 (×3): qty 1

## 2023-12-19 MED ORDER — ISOSORBIDE MONONITRATE ER 30 MG PO TB24
60.0000 mg | ORAL_TABLET | Freq: Every day | ORAL | Status: DC
  Administered 2023-12-20 – 2023-12-21 (×2): 60 mg via ORAL
  Filled 2023-12-19 (×4): qty 2

## 2023-12-19 MED ORDER — QUETIAPINE FUMARATE 50 MG PO TABS: 50.0000 mg | ORAL_TABLET | Freq: Every day | ORAL | 0 refills | Status: DC

## 2023-12-19 MED ORDER — MOMETASONE FURO-FORMOTEROL FUM 200-5 MCG/ACT IN AERO: 2.0000 | INHALATION_SPRAY | Freq: Two times a day (BID) | RESPIRATORY_TRACT | Status: DC

## 2023-12-19 MED ORDER — SODIUM CHLORIDE 1 G PO TABS
1.0000 g | ORAL_TABLET | Freq: Three times a day (TID) | ORAL | Status: DC
  Administered 2023-12-20 – 2023-12-21 (×4): 1 g via ORAL
  Filled 2023-12-19 (×5): qty 1

## 2023-12-19 NOTE — Progress Notes (Signed)
 Inpatient Rehab Admissions Coordinator:   I have a bed for this patient to admit to CIR today. Pt/family in agreement and Dr. Isidoro Donning okay with d/c today.  I will make arrangements.   Estill Dooms, PT, DPT Admissions Coordinator [phone] 12/19/23  11:46 AM

## 2023-12-19 NOTE — H&P (Signed)
 Physical Medicine and Rehabilitation Admission H&P       HPI: Julian Fowler. Julian Fowler is an 89 year old right-handed male with history significant for chronic hyponatremia, asbestosis, hypertension, hyperlipidemia, GERD, PSVT/atrial fibrillation not on anticoagulation due to history of falls, cerebral vascular AV fistula, acoustic neuroma status post craniectomy for excision 1995, BPH, asthma, anxiety, ILD, OSA, bilateral total knee replacement, quit smoking 37 years ago.  Per chart review patient lives with spouse.  1 level home with one-step to entry.  He does have a laundry and work area in the basement.  Ambulatory with a straight point cane.  Patient initially presented to outside hospital [facility] 2/19 with severely elevated blood pressure complaints of fogginess, dizziness and lightheadedness.  He was started on treatment for hypertensive emergency including nitroglycerin infusion.  He was weaned off this to p.o. medications.  Course complicated by some agitation requiring Haldol.  Thought possibly secondary to sundowning had a negative MRI.  Urinalysis spacious for UTI as possibly contributing factors started on ceftriaxone.  Developed significant chest pain 2/25 EKG showed evidence of ST depression diffusely and troponin trend was 1800, at 2200.  He was transferred to [facility] for non-STEMI evaluation and cardiology services consulted.  TTE with LVEF of 50 to 55% and no RWMA.  Cardiology services Dr Remi Deter Mcdowell/Dr Herbie Baltimore recommended medical therapy Plavix monotherapy followed by low-dose aspirin after possibly 3 months.  He has had no further bouts of chest pain or shortness of breath.  Patient's mental status continued to improve suspect acute metabolic encephalopathy superimposed on underlying question mild dementia anxiety and UTI with completion of IV Rocephin.  He continued to remain on Seroquel.  Orthopedic services consulted 12/14/2023 after reported fall when patient's foot  got caught while working with therapy and lost his balance with assisted fall to the floor denying striking his head or loss of consciousness.  Patient's x-rays revealed closed displaced left distal fibula fracture as well as CT of left ankle 12/15/2023 did show a small trimalleolar fracture in the distal fibular component mildly displaced and no surgical intervention with follow-up or orthopedic services Dr. Samson Frederic and nonweightbearing to left lower extremity.  Patient was transition from a cam boot to a well-padded plaster posterior short leg splint with a stirrup.  CT of the left ankle was reviewed by orthopedic services and felt the fracture was in acceptable alignment in the splint and given his recent MI it was safest to manage the fracture conservatively in a splint followed by a cast.  He will continue to be nonweightbearing on the left.  He was weightbearing as tolerated on the right lower extremity.  No current plan for surgical intervention.  Patient with bouts of hyponatremia 127-131 and sodium chloride tablets as directed.  Therapy evaluations completed due to patient's decreased functional mobility was admitted for a comprehensive rehab program.   Review of Systems  Constitutional:  Negative for chills and fever.  HENT:  Positive for hearing loss.        Tinnitus  Eyes:  Negative for blurred vision and double vision.  Respiratory:  Positive for shortness of breath.   Cardiovascular:  Positive for chest pain and palpitations.  Gastrointestinal:  Positive for constipation. Negative for heartburn, nausea and vomiting.       GERD  Genitourinary:  Positive for urgency. Negative for dysuria, flank pain and hematuria.  Musculoskeletal:  Positive for falls and myalgias.  All other systems reviewed and are negative.  Past Medical History:  Diagnosis Date   Allergic rhinitis     Aortic stenosis     Arthritis     Atrial fibrillation (HCC)     BPH (benign prostatic hyperplasia)      Coronary artery calcification seen on CT scan     Easy bruisability     ED (erectile dysfunction)     Essential hypertension     GERD (gastroesophageal reflux disease)     H/O hiatal hernia     Hearing loss in left ear     History of asbestosis     History of shingles     Hyperlipidemia     Overactive bladder     PSVT (paroxysmal supraventricular tachycardia) (HCC)     Sleep apnea      No longer on CPAP following weight loss   Tinnitus               Past Surgical History:  Procedure Laterality Date   BACK SURGERY   2014    herniated L1, L2   Dr Channing Mutters   BIOPSY   09/18/2023    Procedure: BIOPSY;  Surgeon: Corbin Ade, MD;  Location: [facility];  Service: Endoscopy;;   BRAIN SURGERY       CEREBRAL EMBOLIZATION   12/2011    "radiation therapy-did not work"   CHOLECYSTECTOMY   6/98   COLONOSCOPY   09/2009    Dr. Cleotis Nipper: normal, internal hemorrhoids    COLONOSCOPY N/A 02/28/2017    Dr. Jena Gauss: Hemorrhoids, grade 3, mild diverticulosis.   CRANIECTOMY FOR EXCISION OF ACOUSTIC NEUROMA   3/95   ESOPHAGOGASTRODUODENOSCOPY (EGD) WITH PROPOFOL N/A 09/18/2023    Procedure: ESOPHAGOGASTRODUODENOSCOPY (EGD) WITH PROPOFOL;  Surgeon: Corbin Ade, MD;  Location: [facility];  Service: Endoscopy;  Laterality: N/A;  930am, asa 3   MINOR AMPUTATION OF DIGIT Left 12/31/2018    Procedure: REVISION AMPUTATION OF LEFT INDEX FINGER, IRRIGATION AND DEBRIDEMENT LEFT INDEX FINGER;  Surgeon: Ernest Mallick, MD;  Location: [facility] OR;  Service: Orthopedics;  Laterality: Left;   POLYPECTOMY   09/18/2023    Procedure: POLYPECTOMY;  Surgeon: Corbin Ade, MD;  Location: [facility];  Service: Endoscopy;;   RADIOLOGY WITH ANESTHESIA N/A 07/07/2014    Procedure: EMBOLIZATION;  Surgeon: Oneal Grout, MD;  Location: [facility] OR;  Service: Radiology;  Laterality: N/A;   TOTAL KNEE ARTHROPLASTY Left 07/06/2013    Procedure: LEFT TOTAL KNEE ARTHROPLASTY;  Surgeon: Loanne Drilling, MD;  Location: [facility]  ORS;  Service: Orthopedics;  Laterality: Left;   TOTAL KNEE ARTHROPLASTY Right 01/18/2014    Procedure: RIGHT TOTAL KNEE ARTHROPLASTY;  Surgeon: Loanne Drilling, MD;  Location: [facility] ORS;  Service: Orthopedics;  Laterality: Right;   TRIGGER FINGER RELEASE   2003    (thumb) middle finger (2006)             Family History  Problem Relation Age of Onset   Cancer Father          oral cancer   Breast cancer Mother     Heart attack Mother     Hypertension Sister          Bypass x4   Alcohol abuse Sister     Alzheimer's disease Sister     Kidney disease Sister     Colon cancer Neg Hx          Social History:  reports that he quit smoking about 37 years ago. His  smoking use included cigarettes. He started smoking about 66 years ago. He has a 45 pack-year smoking history. He has never been exposed to tobacco smoke. He has never used smokeless tobacco. He reports that he does not drink alcohol and does not use drugs. Allergies:  Allergies       Allergies  Allergen Reactions   Latex Itching   Bactrim [Sulfamethoxazole-Trimethoprim] Other (See Comments)      Unknown reaction   Prednisone Other (See Comments)      Pt is high functioning and out of sorts.   Codeine Other (See Comments)      Grogginess/drowsiness    Tape Other (See Comments)      Skin tears, use paper tape            Medications Prior to Admission  Medication Sig Dispense Refill   acetaminophen (TYLENOL) 325 MG tablet Take 325-650 mg by mouth 2 (two) times daily as needed for moderate pain (pain score 4-6), fever or headache.       azelastine (ASTELIN) 0.1 % nasal spray 1-2 puffs each nostril every 8 hours if needed for drainage 30 mL 12   Cholecalciferol (VITAMIN D-3 PO) Take 1 tablet by mouth daily with lunch.       esomeprazole (NEXIUM) 40 MG capsule Take 1 capsule (40 mg total) by mouth 2 (two) times daily before a meal. 180 capsule 3   hydrALAZINE (APRESOLINE) 50 MG tablet TAKE 1 & 1/2 (ONE & ONE-HALF) TABLETS BY  MOUTH THREE TIMES DAILY 405 tablet 2   latanoprost (XALATAN) 0.005 % ophthalmic solution Place 1 drop into both eyes at bedtime.        LORazepam (ATIVAN) 1 MG tablet TAKE 1 TABLET BY MOUTH THREE TIMES DAILY AS NEEDED FOR ANXIETY (Patient taking differently: Take 1 mg by mouth See admin instructions. Take 1 tablet (1mg ) by mouth nightly at bedtime for sleep. May take an additional 1 tablet twice daily as needed for anxiety.) 90 tablet 5   losartan (COZAAR) 100 MG tablet Take 100 mg by mouth daily.       memantine (NAMENDA) 5 MG tablet Take 5 mg by mouth 2 (two) times daily.       Multiple Vitamins-Minerals (CENTRUM SILVER 50+MEN) TABS Take 1 tablet by mouth daily.       Multiple Vitamins-Minerals (ZINC PO) Take 1 tablet by mouth daily with lunch.       nitrofurantoin (MACRODANTIN) 50 MG capsule Take 50 mg by mouth at bedtime.       Polyethyl Glycol-Propyl Glycol (SYSTANE OP) Place 1 drop into both eyes 2 (two) times daily as needed (dry eyes).       polyethylene glycol (MIRALAX / GLYCOLAX) packet Take 17 g by mouth at bedtime.       potassium chloride SA (KLOR-CON M) 20 MEQ tablet Take 1 tablet (20 mEq total) by mouth daily. 30 tablet 6   pravastatin (PRAVACHOL) 40 MG tablet Take 1 tablet by mouth at bedtime.        PROAIR HFA 108 (90 Base) MCG/ACT inhaler INHALE 2 PUFFS BY MOUTH EVERY 6 HOURS AS NEEDED FOR WHEEZING FOR SHORTNESS OF BREATH 18 g 12   silodosin (RAPAFLO) 8 MG CAPS capsule Take 1 capsule (8 mg total) by mouth at bedtime. 30 capsule 11   sodium chloride 1 g tablet Take 1 g by mouth 3 (three) times daily.       spironolactone (ALDACTONE) 25 MG tablet Take 0.5 tablets (12.5 mg total) by  mouth daily. 45 tablet 1   SYMBICORT 160-4.5 MCG/ACT inhaler INHALE 2 PUFFS BY MOUTH  TWICE DAILY THEN  RINSE  MOUTH (Patient taking differently: Inhale 2 puffs into the lungs 2 (two) times daily as needed (shortness of breath, wheezing). INHALE 2 PUFFS BY MOUTH  TWICE DAILY THEN  RINSE  MOUTH) 11 g 6    Wheat Dextrin (BENEFIBER PO) Take 1 Scoop by mouth 2 (two) times daily.       zolpidem (AMBIEN) 10 MG tablet Take 10 mg by mouth at bedtime.                  Home: Home Living Family/patient expects to be discharged to:: Private residence Living Arrangements: Spouse/significant other Available Help at Discharge: Family, Available 24 hours/day Type of Home: House Home Access: Stairs to enter Secretary/administrator of Steps: 1 Entrance Stairs-Rails: None Home Layout: Laundry or work area in basement [employer] Shower/Tub: Health visitor: Handicapped height Bathroom Accessibility: Yes Home Equipment: Agricultural consultant (2 wheels), [employer] - single point, Occupational hygienist (4 wheels), Shower seat - built in, [employer] - toilet, Grab bars - tub/shower   Functional History: Prior Function Prior Level of Function : Independent/Modified Independent Mobility Comments: ambulatory with SPC   Functional Status:  Mobility: Bed Mobility Overal bed mobility: Needs Assistance Bed Mobility: Sit to Supine Supine to sit: Used rails, HOB elevated, Mod assist Sit to supine: Mod assist, +2 for physical assistance General bed mobility comments: assistance with BLE and trunk Transfers Overall transfer level: Needs assistance Equipment used: Rolling walker (2 wheels), None Transfers: Sit to/from Stand, Bed to chair/wheelchair/BSC Sit to Stand: Max assist, +2 physical assistance, From elevated surface Bed to/from chair/wheelchair/BSC transfer type:: Squat pivot Squat pivot transfers: Max assist, +2 physical assistance General transfer comment: transfer from recliner to EOB with squat pivot transfer and max assist +2 with frequent cues for WB precautions. Sit to stand attempted from EOB with patient unable to come to complete stand Ambulation/Gait Ambulation/Gait assistance: Min assist (maxA with loss of balance resulting in fall, otherwise minA with fatigue) Gait Distance (Feet): 120 Feet  (additional trial of 5') Assistive device: Straight cane, 1 person hand held assist Gait Pattern/deviations: Step-to pattern, Wide base of support General Gait Details: unable to maintain NWB on L LE with coming to standing Gait velocity: reduced Gait velocity interpretation: <1.8 ft/sec, indicate of risk for recurrent falls   ADL: ADL Overall ADL's : Needs assistance/impaired Eating/Feeding: Set up, Bed level Grooming: Set up Upper Body Bathing: Moderate assistance, Bed level, Sitting Lower Body Bathing: +2 for physical assistance, Bed level, Maximal assistance, +2 for safety/equipment Upper Body Dressing : Moderate assistance Lower Body Dressing: Total assistance, Maximal assistance, +2 for physical assistance, +2 for safety/equipment, Bed level General ADL Comments: focused on transfer to bed and bed mobility   Cognition: Cognition Orientation Level: Oriented X4 Cognition Arousal: Alert Behavior During Therapy: Impulsive   Physical Exam: Blood pressure (!) 148/86, pulse 97, temperature 98 F (36.7 C), temperature source Oral, resp. rate 20, height 5\' 9"  (1.753 m), weight 110.1 kg, SpO2 96%. Physical Exam Constitutional: No apparent distress. Appropriate appearance for age.  Laying in bed HENT: No JVD. Neck Supple. Trachea midline. Atraumatic, normocephalic.  Hard of hearing, with bilateral hearing aids. Eyes: PERRLA. EOMI. Visual fields grossly intact.  Glasses donned. Cardiovascular: RRR, no murmurs/rub/gallops. No Edema.  Brisk capillary refill Respiratory: CTAB. No rales, rhonchi, or wheezing. On RA.  Abdomen: + bowel sounds, normoactive. No distention or tenderness.  GU: Not examined. + External catheter, draining clear urine.  Skin: Bilateral upper extremity bruising Left lower extremity splint clean/dry/intact  MSK:     + LLE splint.  Full active range of motion all other extremities.       Neurologic exam:  Cognition: AAO to person, place, time and event.  +  Endorsing hallucinations, seeing the wall move at nighttime Language: Fluent, No substitutions or neoglisms. No dysarthria. Names 3/3 objects correctly.  Memory: Recalls 1/3 objects at 5 minutes. + Deficits, chronic per wife, also new  Insight: Fair insight into current condition.  Mood: Pleasant affect, appropriate mood.  Sensation: To light touch intact absent in left toes. Reflexes: 2+ in BL UE and LEs. Negative Hoffman's laterally. CN: 2-12 grossly intact.  Coordination: No apparent tremors. No ataxia on FTN      Strength:                RUE: 5/5 SA, 5/5 EF, 5/5 EE, 5/5 WE, 5/5 FF, 5/5 FA                LUE:  5/5 SA, 5/5 EF, 5/5 EE, 5/5 WE, 5/5 FF, 5/5 FA                RLE: 4/5 HF, 5/5 KE, 5/5  DF, 5/5  EHL, 5/5  PF                 LLE:  5/5 HF, 5/5 KE, 1/5  DF, 1/5  EHL, 1/5  PF         Lab Results Last 48 Hours        Results for orders placed or performed during the hospital encounter of 12/11/23 (from the past 48 hours)  CBC     Status: Abnormal    Collection Time: 12/17/23  6:23 AM  Result Value Ref Range    WBC 9.3 4.0 - 10.5 K/uL    RBC 3.57 (L) 4.22 - 5.81 MIL/uL    Hemoglobin 10.8 (L) 13.0 - 17.0 g/dL    HCT 40.9 (L) 81.1 - 52.0 %    MCV 87.7 80.0 - 100.0 fL    MCH 30.3 26.0 - 34.0 pg    MCHC 34.5 30.0 - 36.0 g/dL    RDW 91.4 78.2 - 95.6 %    Platelets 359 150 - 400 K/uL    nRBC 0.0 0.0 - 0.2 %      Comment: Performed at [facility], [address], [place], [place] [address]  Renal function panel     Status: Abnormal    Collection Time: 12/17/23  6:23 AM  Result Value Ref Range    Sodium 129 (L) 135 - 145 mmol/L    Potassium 4.0 3.5 - 5.1 mmol/L    Chloride 96 (L) 98 - 111 mmol/L    CO2 23 22 - 32 mmol/L    Glucose, Bld 88 70 - 99 mg/dL      Comment: Glucose reference range applies only to samples taken after fasting for at least 8 hours.    BUN 11 8 - 23 mg/dL    Creatinine, Ser 6.57 0.61 - 1.24 mg/dL    Calcium 8.2 (L) 8.9 - 10.3 mg/dL     Phosphorus 3.7 2.5 - 4.6 mg/dL    Albumin 2.4 (L) 3.5 - 5.0 g/dL    GFR, Estimated >84 >69 mL/min      Comment: (NOTE) Calculated using the CKD-EPI Creatinine Equation (2021)  Anion gap 10 5 - 15      Comment: Performed at [facility], [address], [place], [place] [address]  Magnesium     Status: Abnormal    Collection Time: 12/17/23  6:23 AM  Result Value Ref Range    Magnesium 1.5 (L) 1.7 - 2.4 mg/dL      Comment: Performed at [facility], [address], [place], [place] [address]  CBC     Status: Abnormal    Collection Time: 12/18/23  4:43 AM  Result Value Ref Range    WBC 11.5 (H) 4.0 - 10.5 K/uL    RBC 3.82 (L) 4.22 - 5.81 MIL/uL    Hemoglobin 11.6 (L) 13.0 - 17.0 g/dL    HCT 10.9 (L) 32.3 - 52.0 %    MCV 87.4 80.0 - 100.0 fL    MCH 30.4 26.0 - 34.0 pg    MCHC 34.7 30.0 - 36.0 g/dL    RDW 55.7 32.2 - 02.5 %    Platelets 382 150 - 400 K/uL    nRBC 0.0 0.0 - 0.2 %      Comment: Performed at [facility], [address], [place], [place] [address]      Imaging Results (Last 48 hours)  No results found.         Blood pressure (!) 148/86, pulse 97, temperature 98 F (36.7 C), temperature source Oral, resp. rate 20, height 5\' 9"  (1.753 m), weight 110.1 kg, SpO2 96%.   Medical Problem List and Plan: 1. Functional deficits secondary to debility after non-STEMI with conservative care complicated by left fibular fracture after fall.NWB.  Follow-up outpatient orthopedic services DR Victorino Dike to consider need for surgical intervention             -patient may shower with cast covered              -ELOS/Goals: 18 to 21 days, supervision level PT/OT/SLP  -Admit to inpatient rehab 2.  Antithrombotics: -DVT/anticoagulation.  Subcutaneous heparin initiated 12/18/2023             -antiplatelet therapy: Plavix 75 mg daily 3. Pain Management: Voltaren gel 4 times daily, tramadol as needed 4. Mood/Behavior/Sleep: Ativan 0.5 mg 3 times daily as needed.  Provide  emotional support             -antipsychotic agents: Seroquel 50 mg nightly  -delirium precautions 5. Neuropsych/cognition: This patient is not capable of making decisions on his own behalf. 6. Skin/Wound Care: Routine skin checks 7. Fluids/Electrolytes/Nutrition: Routine in and outs with follow-up chemistries 8.  Left oblique fibular fracture.  Nonweightbearing.  Transition from cam boot to well-padded plastic posterior short leg splint with stirrup.  No current plan for surgical intervention 9.  Hypertension.  Norvasc 10 mg daily, hydralazine 100 mg 3 times daily, Avapro 150 mg daily, Imdur 60 mg daily, Aldactone 25 mg daily.  Monitor with increased mobility 10.  OSA/COPD, continue inhalers as directed.  Monitor oxygen saturations 11.  Chronic hyponatremia.  Sodium chloride tablets.  Follow-up chemistries 12.  Hyperlipidemia.  Pravachol 13.  GERD.  Protonix   Charlton Amor, PA-C 12/18/2023

## 2023-12-19 NOTE — Plan of Care (Signed)
  Problem: Health Behavior/Discharge Planning: Goal: Ability to manage health-related needs will improve Outcome: Progressing   Problem: Clinical Measurements: Goal: Respiratory complications will improve Outcome: Progressing Goal: Cardiovascular complication will be avoided Outcome: Progressing   Problem: Nutrition: Goal: Adequate nutrition will be maintained Outcome: Progressing   Problem: Elimination: Goal: Will not experience complications related to urinary retention Outcome: Progressing   Problem: Safety: Goal: Ability to remain free from injury will improve Outcome: Progressing   Problem: Cardiac: Goal: Ability to achieve and maintain adequate cardiopulmonary perfusion will improve Outcome: Progressing   Problem: Cardiovascular: Goal: Ability to achieve and maintain adequate cardiovascular perfusion will improve Outcome: Progressing

## 2023-12-19 NOTE — Progress Notes (Signed)
 Angelina Sheriff, DO  Physician Physical Medicine and Rehabilitation   PMR Pre-admission    Signed   Date of Service: 12/19/2023 11:47 AM  Related encounter: Admission (Current) from 12/11/2023 in [place] [facility]   Signed     Expand All Collapse All  PMR Admission Coordinator Pre-Admission Assessment   Patient: Julian Fowler is an 89 y.o., male MRN: [id number] DOB: 04-25-1935 Height: 5\' 9"  (175.3 cm) Weight: 110.1 kg   Insurance Information HMO:     PPO:      PCP:      IPA:      80/20:      OTHER:  PRIMARY: Medicare Part A and B      Policy#: [id number]       Subscriber: pt CM Name:       Phone#:      Fax#:  Pre-Cert#: verified online      Employer:  Benefits:  Phone #:      Name:  Eff. Date:  04/14/02 A/B      Deduct: $1676      Out of Pocket Max: n/a      Life Max: n/a CIR: 100%      SNF: 20 full days Outpatient: 80%     Co-Pay: 20% Home Health: 100%      Co-Pay:  DME: 80%     Co-Pay: 20% Providers:  SECONDARYSusa Simmonds      Policy#: [id number]      Phone#: [phone]   Financial Counselor:       Phone#:    The "Data Collection Information Summary" for patients in Inpatient Rehabilitation Facilities with attached "Privacy Act Statement-Health Care Records" was provided and verbally reviewed with: Patient and Family   Emergency Contact Information Contact Information       Name Relation Home Work Mobile    Mauro,Lois Spouse [phone]   [phone]    Mikki Santee Daughter     [phone]         Other Contacts   None on File        Current Medical History  Patient Admitting Diagnosis: NSTEMI/LLE fracture   History of Present Illness: pt is an 89 y/o male with PMH of HTN, hyperlipidemia, GERD, afib, CVA, AV fistula, acoustic neuroma, BPH, anxiety, ILD, OSA who initially presented to [facility] on [facility] for c/o fogginess/dizziness/lightheadedness found to have hypertensive emergency with BPs significantly elevated per report.  Pt was  started on a nitroglycerin infusion at [facility].  On 2/26 he developed crushing chest pain, EKG showed evidence of ST depression in addition to uptrending troponin, and pt was transferred to [facility] for emergent cardiac cath.  Troponin as high as 7551.  Hospital course complicated by confusion and so invasive evaluation was deferred and cardiology opted for medical management.  Pt had a fall on 2/28 resulted in L distal fibular fracture.  Orthopedics was consulted and recommended cam boot and NWB to LLE with outpatient f/u.  On therapy reassessment, pt significantly limited in mobility 2/2 weight bearing restriction.  He was switched to a plaster posterior short leg splint with stirrup.  Therapy evaluations were completed and pt was recommended for CIR.    Patient's medical record from [facility] has been reviewed by the rehabilitation admission coordinator and physician.   Past Medical History      Past Medical History:  Diagnosis Date   Allergic rhinitis     Aortic stenosis     Arthritis  Atrial fibrillation (HCC)     BPH (benign prostatic hyperplasia)     Coronary artery calcification seen on CT scan     Easy bruisability     ED (erectile dysfunction)     Essential hypertension     GERD (gastroesophageal reflux disease)     H/O hiatal hernia     Hearing loss in left ear     History of asbestosis     History of shingles     Hyperlipidemia     Overactive bladder     PSVT (paroxysmal supraventricular tachycardia) (HCC)     Sleep apnea      No longer on CPAP following weight loss   Tinnitus            Has the patient had major surgery during 100 days prior to admission? No   Family History   family history includes Alcohol abuse in his sister; Alzheimer's disease in his sister; Breast cancer in his mother; Cancer in his father; Heart attack in his mother; Hypertension in his sister; Kidney disease in his sister.   Current Medications  Current Medications    Current  Facility-Administered Medications:    acetaminophen (TYLENOL) tablet 650 mg, 650 mg, Oral, Q4H PRN, Synetta Fail, MD, 650 mg at 12/19/23 0844   albuterol (PROVENTIL) (2.5 MG/3ML) 0.083% nebulizer solution 2.5 mg, 2.5 mg, Nebulization, Q6H PRN, Synetta Fail, MD   amLODipine (NORVASC) tablet 10 mg, 10 mg, Oral, Daily, Marykay Lex, MD, 10 mg at 12/19/23 0844   clopidogrel (PLAVIX) tablet 75 mg, 75 mg, Oral, Daily, Marykay Lex, MD, 75 mg at 12/19/23 0845   diclofenac Sodium (VOLTAREN) 1 % topical gel 2 g, 2 g, Topical, QID, Segars, Christiane Ha, MD, 2 g at 12/16/23 2225   guaiFENesin (MUCINEX) 12 hr tablet 1,200 mg, 1,200 mg, Oral, BID, Segars, Christiane Ha, MD, 1,200 mg at 12/19/23 0845   heparin injection 5,000 Units, 5,000 Units, Subcutaneous, Q8H, Carollee Herter, DO, 5,000 Units at 12/19/23 0981   hydrALAZINE (APRESOLINE) tablet 100 mg, 100 mg, Oral, TID, Synetta Fail, MD, 100 mg at 12/19/23 0845   irbesartan (AVAPRO) tablet 150 mg, 150 mg, Oral, Daily, Marykay Lex, MD, 150 mg at 12/19/23 0844   isosorbide mononitrate (IMDUR) 24 hr tablet 60 mg, 60 mg, Oral, Daily, Marykay Lex, MD, 60 mg at 12/19/23 0845   LORazepam (ATIVAN) tablet 0.5 mg, 0.5 mg, Oral, TID PRN, Carollee Herter, DO, 0.5 mg at 12/19/23 0844   mometasone-formoterol (DULERA) 200-5 MCG/ACT inhaler 2 puff, 2 puff, Inhalation, BID, Synetta Fail, MD, 2 puff at 12/19/23 0845   nitroGLYCERIN (NITROSTAT) SL tablet 0.4 mg, 0.4 mg, Sublingual, Q5 Min x 3 PRN, Synetta Fail, MD, 0.4 mg at 12/12/23 2035   ondansetron [facility]) injection 4 mg, 4 mg, Intravenous, Q6H PRN, Synetta Fail, MD   pantoprazole (PROTONIX) EC tablet 40 mg, 40 mg, Oral, Daily, Synetta Fail, MD, 40 mg at 12/19/23 0845   pravastatin (PRAVACHOL) tablet 40 mg, 40 mg, Oral, QHS, Synetta Fail, MD, 40 mg at 12/18/23 2041   QUEtiapine (SEROQUEL) tablet 50 mg, 50 mg, Oral, QHS, Synetta Fail, MD, 50 mg at 12/18/23 2041    sodium chloride tablet 1 g, 1 g, Oral, TID WC, Synetta Fail, MD, 1 g at 12/19/23 0845   spironolactone (ALDACTONE) tablet 25 mg, 25 mg, Oral, Daily, Synetta Fail, MD, 25 mg at 12/19/23 0845   traMADol (ULTRAM) tablet 25 mg,  25 mg, Oral, Q12H PRN, Dolly Rias, MD, 25 mg at 12/16/23 2221     Patients Current Diet:  Diet Order                  Diet regular Fluid consistency: Thin  Diet effective now                         Precautions / Restrictions Precautions Precautions: Fall Precaution/Restrictions Comments: fall on 2/28 with PT Other Brace: L posterior LE splint with stirup Restrictions Weight Bearing Restrictions Per Provider Order: Yes LLE Weight Bearing Per Provider Order: Non weight bearing Other Position/Activity Restrictions: L LE posterior splint with stirrup, with ACE wrap    Has the patient had 2 or more falls or a fall with injury in the past year? Yes   Prior Activity Level Limited Community (1-2x/wk): mod I with SPC at baseline, still driving per pt report   Prior Functional Level Self Care: Did the patient need help bathing, dressing, using the toilet or eating? Independent   Indoor Mobility: Did the patient need assistance with walking from room to room (with or without device)? Independent   Stairs: Did the patient need assistance with internal or external stairs (with or without device)? Independent   Functional Cognition: Did the patient need help planning regular tasks such as shopping or remembering to take medications? Independent   Patient Information Are you of Hispanic, Latino/a,or Spanish origin?: A. No, not of Hispanic, Latino/a, or Spanish origin What is your race?: A. White Do you need or want an interpreter to communicate with a doctor or health care staff?: 0. No   Patient's Response To:  Health Literacy and Transportation Is the patient able to respond to health literacy and transportation needs?: Yes Health Literacy  - How often do you need to have someone help you when you read instructions, pamphlets, or other written material from your doctor or pharmacy?: Never In the past 12 months, has lack of transportation kept you from medical appointments or from getting medications?: No In the past 12 months, has lack of transportation kept you from meetings, work, or from getting things needed for daily living?: No   Home Assistive Devices / Equipment Home Equipment: Agricultural consultant (2 wheels), [employer] - single point, Occupational hygienist (4 wheels), Shower seat - built in, [employer] - toilet, Grab bars - tub/shower   Prior Device Use: Indicate devices/aids used by the patient prior to current illness, exacerbation or injury? None of the above   Current Functional Level Cognition   Orientation Level: Oriented X4    Extremity Assessment (includes Sensation/Coordination)   Upper Extremity Assessment: Defer to OT evaluation  Lower Extremity Assessment: Generalized weakness, RLE deficits/detail, LLE deficits/detail RLE Deficits / Details: Bruising of R great toe and foot swelling. Pt reported R foot as being sore LLE Deficits / Details: in CAM boot s/p fibula fracture     ADLs   Overall ADL's : Needs assistance/impaired Eating/Feeding: Set up, Bed level Grooming: Set up Upper Body Bathing: Moderate assistance, Bed level, Sitting Lower Body Bathing: +2 for physical assistance, Bed level, Maximal assistance, +2 for safety/equipment Upper Body Dressing : Moderate assistance Lower Body Dressing: Total assistance, Maximal assistance, +2 for physical assistance, +2 for safety/equipment, Bed level General ADL Comments: focused on transfer to bed and bed mobility     Mobility   Overal bed mobility: Needs Assistance Bed Mobility: Sit to Supine Supine to sit: Used rails, HOB  elevated, Mod assist Sit to supine: Mod assist, +2 for physical assistance General bed mobility comments: assistance with BLE and trunk     Transfers    Overall transfer level: Needs assistance Equipment used: Rolling walker (2 wheels), None Transfers: Sit to/from Stand, Bed to chair/wheelchair/BSC Sit to Stand: Max assist, +2 physical assistance, From elevated surface Bed to/from chair/wheelchair/BSC transfer type:: Squat pivot Squat pivot transfers: Max assist, +2 physical assistance General transfer comment: transfer from recliner to EOB with squat pivot transfer and max assist +2 with frequent cues for WB precautions. Sit to stand attempted from EOB with patient unable to come to complete stand     Ambulation / Gait / Stairs / Wheelchair Mobility   Ambulation/Gait Ambulation/Gait assistance: Min assist (maxA with loss of balance resulting in fall, otherwise minA with fatigue) Gait Distance (Feet): 120 Feet (additional trial of 5') Assistive device: Straight cane, 1 person hand held assist Gait Pattern/deviations: Step-to pattern, Wide base of support General Gait Details: unable to maintain NWB on L LE with coming to standing Gait velocity: reduced Gait velocity interpretation: <1.8 ft/sec, indicate of risk for recurrent falls     Posture / Balance Dynamic Sitting Balance Sitting balance - Comments: posterior lean and UE support to maintain Balance Overall balance assessment: Needs assistance Sitting-balance support: Feet supported, Single extremity supported, Bilateral upper extremity supported Sitting balance-Leahy Scale: Fair Sitting balance - Comments: posterior lean and UE support to maintain Postural control: Posterior lean Standing balance support: Bilateral upper extremity supported Standing balance-Leahy Scale: Poor Standing balance comment: unable to stand for extended period of time due to difficulty maintaining WB precautions     Special needs/care consideration N/a    Previous Home Environment (from acute therapy documentation) Living Arrangements: Spouse/significant other Available Help at Discharge: Family,  Available 24 hours/day Type of Home: House Home Layout: Laundry or work area in basement Home Access: Stairs to enter Entrance Stairs-Rails: None Secretary/administrator of Steps: 1 Bathroom Shower/Tub: Health visitor: Handicapped height Bathroom Accessibility: Yes Home Care Services: No   Discharge Living Setting Plans for Discharge Living Setting: Patient's home, Lives with (comment) (spouse) Type of Home at Discharge: House Discharge Home Layout: Laundry or work area in basement Discharge Home Access: Stairs to enter Entrance Stairs-Rails: None Secretary/administrator of Steps: 1 Discharge Bathroom Shower/Tub: Walk-in shower Discharge Bathroom Toilet: Handicapped height Discharge Bathroom Accessibility: Yes How Accessible: Accessible via wheelchair Does the patient have any problems obtaining your medications?: No   Social/Family/Support Systems Patient Roles: Spouse Anticipated Caregiver: spouse, Gershon Shorten Anticipated Caregiver's Contact Information: [phone] Ability/Limitations of Caregiver: can provide supervision for mobility and light assist for ADLs Caregiver Availability: 24/7 Discharge Plan Discussed with Primary Caregiver: Yes Is Caregiver In Agreement with Plan?: Yes Does Caregiver/Family have Issues with Lodging/Transportation while Pt is in Rehab?: No   Goals Patient/Family Goal for Rehab: PT/OT supervision w/c level, SLP n/a Expected length of stay: 18-21 days Additional Information: Discharge plan: return to pt's home with spouse Pt/Family Agrees to Admission and willing to participate: Yes Program Orientation Provided & Reviewed with Pt/Caregiver Including Roles  & Responsibilities: Yes   Decrease burden of Care through IP rehab admission: n/a   Possible need for SNF placement upon discharge:  Not anticipated.  Plan for d/c home with spouse to provide 24/7 supervision.     Patient Condition: I have reviewed medical records from [facility], spoken with CM, and patient and spouse. I met with patient at the bedside for inpatient  rehabilitation assessment.  Patient will benefit from ongoing PT and OT, can actively participate in 3 hours of therapy a day 5 days of the week, and can make measurable gains during the admission.  Patient will also benefit from the coordinated team approach during an Inpatient Acute Rehabilitation admission.  The patient will receive intensive therapy as well as Rehabilitation physician, nursing, social worker, and care management interventions.  Due to safety, skin/wound care, disease management, medication administration, pain management, and patient education the patient requires 24 hour a day rehabilitation nursing.  The patient is currently max assist with mobility and basic ADLs.  Discharge setting and therapy post discharge at home with home health is anticipated.  Patient has agreed to participate in the Acute Inpatient Rehabilitation Program and will admit today.   Preadmission Screen Completed By:  Stephania Fragmin, PT, DPT 12/19/2023 11:47 AM ______________________________________________________________________   Discussed status with Dr. Shearon Stalls on 12/19/23  at 11:47 AM  and received approval for admission today.   Admission Coordinator:  Stephania Fragmin, PT, DPT time 11:47 AM Dorna Bloom 12/19/23     Assessment/Plan: Diagnosis: NSTEMI Does the need for close, 24 hr/day Medical supervision in concert with the patient's rehab needs make it unreasonable for this patient to be served in a less intensive setting? Yes Co-Morbidities requiring supervision/potential complications: UTI s/p antibiotics, delirium.agitaion, NSTEMI s/p catheterization, left fibular fracture, hyponatremia, hypertension, GERD, HLD, and OSA Due to bladder management, bowel management, safety, skin/wound care, disease management, medication administration, pain management, and patient education, does the patient require 24 hr/day rehab  nursing? Yes Does the patient require coordinated care of a physician, rehab nurse, PT, OT, and SLP to address physical and functional deficits in the context of the above medical diagnosis(es)? Yes Addressing deficits in the following areas: balance, endurance, locomotion, strength, transferring, bowel/bladder control, bathing, dressing, feeding, grooming, and cognition Can the patient actively participate in an intensive therapy program of at least 3 hrs of therapy 5 days a week? Yes The potential for patient to make measurable gains while on inpatient rehab is good Anticipated functional outcomes upon discharge from inpatient rehab: supervision PT, supervision OT, supervision SLP Estimated rehab length of stay to reach the above functional goals is: 18-21 days Anticipated discharge destination: Home 10. Overall Rehab/Functional Prognosis: good     MD Signature:   Angelina Sheriff, DO 12/19/2023            Revision History

## 2023-12-19 NOTE — Discharge Summary (Signed)
 Physician Discharge Summary   Patient: Julian Fowler MRN: [id number] DOB: 12/23/1934  Admit date:     12/11/2023  Discharge date: 12/19/23  Discharge Physician: Thad Ranger, MD    PCP: Juliette Alcide, MD   Recommendations at discharge:   Patient accepted to inpatient rehab   Discharge Diagnoses:    NSTEMI (non-ST elevated myocardial infarction) [facility])   Permanent atrial fibrillation (HCC) - Not on anticoagulation due to history of falls and skin tears   Mechanical fall with acute left trimalleolar fracture   UTI (urinary tract infection)   Hyponatremia   Acute metabolic encephalopathy-resolved   GERD   Anxiety   Hyperlipidemia   Essential hypertension   OSA (obstructive sleep apnea)   ILD (interstitial lung disease) (HCC)   Benign prostatic hyperplasia with urinary obstruction   Obesity, Class II, BMI 35-39.9    Hospital Course:  Patient is a 89 year old male with HTN, hyperlipidemia, GERD, A-fib, cerebrovascular AV fistula, acoustic neuroma, BPH, asthma anxiety, ILD, asbestosis, OSA presented from [facility] with NSTEMI.    Patient initially presented dated on 2/19 with hypertensive emergency, dizziness, fogginess.  He was treated with nitroglycerin infusion and weaned off to p.o. medications.    He was also found to have UTI, placed on IV Rocephin, developed delirium and agitation secondary to sundowning.  He had a negative MRI. On 2/25, patient had a significant crushing chest painEKG showed evidence of ST depression diffusely and elevated troponin to 1819.  He was treated with aspirin, nitroglycerin, Lovenox and metoprolol.  He was transferred to [facility] for NSTEMI evaluation, management and cardiac cath.  Cardiology was consulted.  Troponin noted to be continuing to trend up, here 7237.  Patient was admitted for further workup.   Assessment and Plan:  NSTEMI (non-ST elevated myocardial infarction) [facility]) Initially admitted at [facility] for HTN emergency and  treated with IV nitro.  Troponin peaked at 7551. TTE with LVEF of 50 to 55% and no RWMA.  -  Evaluated by cardiology. -Cardiology recommended Plavix monotherapy at least for 1 months (preferably for 3 months) followed by low-dose aspirin 81 mg daily.  Continue statin -Continue BP control with Imdur, amlodipine hydralazine, Avapro       Acute metabolic encephalopathy superimposed on underlying ?mild dementia, anxiety -Likely worsened due to UTI, hyponatremia, sodium was 119 at [facility] -Completed IV Rocephin on 3/3 -Continue Seroquel qhs.   Mechanical fall with left fibular fracture -Appeared to have sustained this during physical therapy session on 12/13/2023. -X-ray revealed oblique fibular fracture on left -CT 3/2 showed a trimalleolar ankle fracture.  CT right foot showed soft tissue swelling but no acute fracture.   -Ortho recommended splint followed by a cast, NWB on LLE, walker.  -Seen by Dr. Victorino Dike, due to recent NSTEMI, no urgency for ORIF.  It will not change his weightbearing status.  Continue NWB, outpatient follow-up with orthopedics.    UTI, UA positive on 12/24 at [facility] -Has completed IV Rocephin 2/24-3/3 -Prior history of recurrent UTIs, urine cultures have shown E. coli in the past     Acute on chronic hyponatremia - Na 119 at [facility], has been on salt tablets.  Per family, he continues to do these as he was hospitalized with severe hyponatremia in the past -Continue salt tabs, asymptomatic     Permanent atrial fibrillation (HCC) -Rate controlled -Not on anticoagulation due to history of falls and skin tears     Asthma, mild intermittent, well-controlled History of asbestosis, ILD -Currently  stable, no wheezing   Hypertension -Patient had originally presented with hypertensive emergency to [facility] with a BP 225/119, was placed on IV nitroglycerin drip -BP now troller.  Continue amlodipine, hydralazine, Imdur, irbesartan, spironolactone     GERD -Continue PPI        Hyperlipidemia -Continue statin       OSA (obstructive sleep apnea) Not on CPAP   Obesity class II Estimated body mass index is 35.86 kg/m as calculated from the following:   Height as of this encounter: 5\' 9"  (1.753 m).   Weight as of this encounter: 110.1 kg.         Pain control - [employer] Controlled Substance Reporting System database was reviewed. and patient was instructed, not to drive, operate heavy machinery, perform activities at heights, swimming or participation in water activities or provide baby-sitting services while on Pain, Sleep and Anxiety Medications; until their outpatient Physician has advised to do so again. Also recommended to not to take more than prescribed Pain, Sleep and Anxiety Medications.  Consultants: Orthopedics, Dr. Victorino Dike, cardiology, Dr. Herbie Baltimore, CIR Procedures performed:  Disposition: CIR Diet recommendation: Regular diet  DISCHARGE MEDICATION: Allergies as of 12/19/2023       Reactions   Latex Itching   Bactrim [sulfamethoxazole-trimethoprim] Other (See Comments)   Unknown reaction   Prednisone Other (See Comments)   Pt is high functioning and out of sorts.   Codeine Other (See Comments)   Grogginess/drowsiness    Tape Other (See Comments)   Skin tears, use paper tape        Medication List     STOP taking these medications    losartan 100 MG tablet Commonly known as: COZAAR       TAKE these medications    acetaminophen 325 MG tablet Commonly known as: TYLENOL Take 325-650 mg by mouth 2 (two) times daily as needed for moderate pain (pain score 4-6), fever or headache.   amLODipine 10 MG tablet Commonly known as: NORVASC Take 1 tablet (10 mg total) by mouth daily. Start taking on: December 20, 2023   azelastine 0.1 % nasal spray Commonly known as: ASTELIN 1-2 puffs each nostril every 8 hours if needed for drainage   BENEFIBER PO Take 1 Scoop by mouth 2 (two) times daily.   Centrum Silver 50+Men Tabs Take 1  tablet by mouth daily.   clopidogrel 75 MG tablet Commonly known as: PLAVIX Take 1 tablet (75 mg total) by mouth daily. Start taking on: December 20, 2023   esomeprazole 40 MG capsule Commonly known as: NEXIUM Take 1 capsule (40 mg total) by mouth 2 (two) times daily before a meal.   hydrALAZINE 100 MG tablet Commonly known as: APRESOLINE Take 1 tablet (100 mg total) by mouth 3 (three) times daily. What changed:  medication strength See the new instructions.   irbesartan 150 MG tablet Commonly known as: AVAPRO Take 1 tablet (150 mg total) by mouth daily. Start taking on: December 20, 2023   isosorbide mononitrate 60 MG 24 hr tablet Commonly known as: IMDUR Take 1 tablet (60 mg total) by mouth daily. Start taking on: December 20, 2023   latanoprost 0.005 % ophthalmic solution Commonly known as: XALATAN Place 1 drop into both eyes at bedtime.   LORazepam 1 MG tablet Commonly known as: ATIVAN TAKE 1 TABLET BY MOUTH THREE TIMES DAILY AS NEEDED FOR ANXIETY What changed:  when to take this additional instructions   memantine 5 MG tablet Commonly known as: NAMENDA Take 5  mg by mouth 2 (two) times daily.   nitrofurantoin 50 MG capsule Commonly known as: MACRODANTIN Take 50 mg by mouth at bedtime.   nitroGLYCERIN 0.4 MG SL tablet Commonly known as: NITROSTAT Place 1 tablet (0.4 mg total) under the tongue every 5 (five) minutes x 3 doses as needed for chest pain.   polyethylene glycol 17 g packet Commonly known as: MIRALAX / GLYCOLAX Take 17 g by mouth at bedtime.   potassium chloride SA 20 MEQ tablet Commonly known as: KLOR-CON M Take 1 tablet (20 mEq total) by mouth daily.   pravastatin 40 MG tablet Commonly known as: PRAVACHOL Take 1 tablet by mouth at bedtime.   ProAir HFA 108 (90 Base) MCG/ACT inhaler Generic drug: albuterol INHALE 2 PUFFS BY MOUTH EVERY 6 HOURS AS NEEDED FOR WHEEZING FOR SHORTNESS OF BREATH   QUEtiapine 50 MG tablet Commonly known as:  SEROQUEL Take 1 tablet (50 mg total) by mouth at bedtime.   silodosin 8 MG Caps capsule Commonly known as: RAPAFLO Take 1 capsule (8 mg total) by mouth at bedtime.   sodium chloride 1 g tablet Take 1 g by mouth 3 (three) times daily.   spironolactone 25 MG tablet Commonly known as: ALDACTONE Take 1 tablet (25 mg total) by mouth daily. What changed: how much to take   Symbicort 160-4.5 MCG/ACT inhaler Generic drug: budesonide-formoterol INHALE 2 PUFFS BY MOUTH  TWICE DAILY THEN  RINSE  MOUTH What changed:  how much to take how to take this when to take this reasons to take this   SYSTANE OP Place 1 drop into both eyes 2 (two) times daily as needed (dry eyes).   VITAMIN D-3 PO Take 1 tablet by mouth daily with lunch.   ZINC PO Take 1 tablet by mouth daily with lunch.   zolpidem 10 MG tablet Commonly known as: AMBIEN Take 10 mg by mouth at bedtime.        Follow-up Information     Care, [facility] Follow up.   Why: Home Health Physical Therapy-office to call with visit times. Contact information: [address] [place] [address] [phone]         Toni Arthurs, MD. Schedule an appointment as soon as possible for a visit in 5 day(s).   Specialty: Orthopedic Surgery Why: repeat x-rays left ankle Contact information: [address] [place] [address] [phone]         Sharlene Dory, PA-C Follow up on 12/27/2023.   Specialty: Cardiology Why: at 2:45 PM, for hospital follow-up Contact information: [address] [place] [address] [place] [address] [phone]                Discharge Exam: Ceasar Mons Weights   12/11/23 1254 12/11/23 1312 12/12/23 0306  Weight: 108 kg 108 kg 110.1 kg   S: somewhat frustrated today, wife at the bedside.  Left ankle pain is controlled.   BP (!) 103/59 (BP Location: Right Arm)   Pulse 95   Temp 97.9 F (36.6 C) (Oral)   Resp 15   Ht 5\' 9"  (1.753 m)   Wt 110.1 kg   SpO2  92%   BMI 35.86 kg/m   Physical Exam General: Alert and oriented x 3, NAD Cardiovascular: S1 S2 clear, RRR.  Respiratory: CTAB, no wheezing Gastrointestinal: Soft, nontender, nondistended, NBS Ext: left LE in splint Neuro: no new deficits Psych: Normal affect    Condition at discharge: fair  The results of significant  diagnostics from this hospitalization (including imaging, microbiology, ancillary and laboratory) are listed below for reference.   Imaging Studies: CT FOOT RIGHT WO CONTRAST Result Date: 12/15/2023 CLINICAL DATA:  Foot pain and swelling after recent fall. Fracture suspected. EXAM: CT OF THE RIGHT FOOT WITHOUT CONTRAST TECHNIQUE: Multidetector CT imaging of the right foot was performed according to the standard protocol. Multiplanar CT image reconstructions were also generated. RADIATION DOSE REDUCTION: This exam was performed according to the departmental dose-optimization program which includes automated exposure control, adjustment of the mA and/or kV according to patient size and/or use of iterative reconstruction technique. COMPARISON:  Radiographs 12/14/2023 and 07/17/2019. FINDINGS: Bones/Joint/Cartilage The bones are demineralized. There is no definite evidence of acute fracture or dislocation. Minimal irregularity of the base of the 5th metatarsal without definite acute fracture. Stable old healed fracture of the distal 5th metatarsal. Hammertoe deformities noted. There are scattered degenerative changes, most advanced at the 1st metatarsophalangeal joint. Mild midfoot degenerative changes are also noted. Fragmented spurring of the medial malleolus has a nonacute appearance. No significant joint effusions are identified. Ligaments Suboptimally assessed by CT. Muscles and Tendons As evaluated by CT, the ankle and foot tendons appear intact without significant tenosynovitis. Generalized atrophy of the foot musculature. Soft tissues Diffuse dorsal subcutaneous swelling without  evidence of focal fluid collection, foreign body or soft tissue emphysema. IMPRESSION: 1. No definite evidence of acute fracture or dislocation in the right foot. Minimal irregularity of the base of the 5th metatarsal without definite fracture. Correlate with point tenderness and consider radiographic follow-up. 2. Diffuse dorsal subcutaneous swelling without evidence of focal fluid collection, foreign body or soft tissue emphysema. 3. Scattered degenerative changes, most advanced at the 1st metatarsophalangeal joint. Electronically Signed   By: Carey Bullocks M.D.   On: 12/15/2023 16:09   CT ANKLE LEFT WO CONTRAST Result Date: 12/15/2023 CLINICAL DATA:  Recent fall.  Left ankle fracture. EXAM: CT OF THE LEFT ANKLE WITHOUT CONTRAST TECHNIQUE: Multidetector CT imaging of the left ankle was performed according to the standard protocol. Multiplanar CT image reconstructions were also generated. RADIATION DOSE REDUCTION: This exam was performed according to the departmental dose-optimization program which includes automated exposure control, adjustment of the mA and/or kV according to patient size and/or use of iterative reconstruction technique. COMPARISON:  Radiographs 12/12/2022. FINDINGS: Bones/Joint/Cartilage The ankle is splinted. The bones are diffusely demineralized. There is an oblique mildly displaced fracture of the distal fibular metadiaphysis which extends into the anterior aspect of the distal tibiofibular joint. There is a small avulsion fracture of the tip of the medial malleolus, best seen on the sagittal images. In addition, there is a nondisplaced intra-articular fracture of the posterior malleolus, also best seen on the sagittal images. There is no widening of the ankle mortise. The talus is located. No evidence of acute tarsal bone fracture or significant ankle joint effusion. There are mild midfoot degenerative changes. Ligaments Suboptimally assessed by CT. Muscles and Tendons As evaluated by  CT, the ankle tendons appear intact without entrapment in the fractures. Soft tissues Mild soft tissue swelling around the ankle, greatest laterally. No focal fluid collection, foreign body or soft tissue emphysema. There is some calcification within the posterior aspect of the plantar fascia. IMPRESSION: 1. Trimalleolar fracture of the left ankle as described. The distal fibular component is mildly displaced. The tibial components are nondisplaced. 2. No widening of the ankle mortise or evidence of tarsal bone fracture. 3. The ankle tendons appear intact without entrapment in the fractures. Electronically  Signed   By: Carey Bullocks M.D.   On: 12/15/2023 15:58   DG Foot 2 Views Right Result Date: 12/14/2023 CLINICAL DATA:  Right foot pain. EXAM: RIGHT FOOT - 2 VIEW COMPARISON:  07/17/2019 FINDINGS: No acute fracture. Remote healed fifth metatarsal fracture unchanged from prior. Moderate to advanced arthropathy of the first metatarsal phalangeal joint, stable from prior. Hammertoe deformity of the toes. Midfoot degenerative spurring. Plantar calcaneal spur and Achilles tendon enthesophyte. No erosions. There is dorsal soft tissue edema. IMPRESSION: 1. Dorsal soft tissue edema. 2. No acute fracture or subluxation. 3. Moderate to advanced arthropathy of the first metatarsophalangeal joint, stable from prior. 4. Hammertoe deformity of the toes. Electronically Signed   By: Narda Rutherford M.D.   On: 12/14/2023 20:15   DG Tibia/Fibula Left Result Date: 12/14/2023 CLINICAL DATA:  Closed fracture of left distal fibula.  Swelling. EXAM: LEFT TIBIA AND FIBULA - 2 VIEW COMPARISON:  Ankle radiograph yesterday FINDINGS: Mildly displaced distal fibular fracture, proximal to the ankle mortise. No significant change in alignment from yesterday's ankle exam. The proximal fibula is intact. There is no tibial fracture. Mild tibial talar degenerative spurring. Knee arthroplasty is intact. Generalized subcutaneous edema.  IMPRESSION: 1. Mildly displaced distal fibular fracture. No significant change in alignment from yesterday's ankle exam. 2. No additional fracture of the lower leg. Electronically Signed   By: Narda Rutherford M.D.   On: 12/14/2023 20:13   DG Ankle Complete Left Result Date: 12/13/2023 CLINICAL DATA:  Recent fall with left ankle pain, initial encounter EXAM: LEFT ANKLE COMPLETE - 3+ VIEW COMPARISON:  None Available. FINDINGS: Minimally displaced oblique fracture of the distal fibular metaphysis is seen. Irregularity adjacent to the medial malleolus is noted suspicious for avulsion. No other focal fracture is seen. Mild lateral soft tissue swelling is seen. Degenerative changes of the tarsal bones are noted. IMPRESSION: Oblique fibular fracture as described. Findings suspicious for avulsion from the tip of the medial malleolus. Electronically Signed   By: Alcide Clever M.D.   On: 12/13/2023 20:23   ECHOCARDIOGRAM LIMITED Result Date: 12/12/2023    ECHOCARDIOGRAM LIMITED REPORT   Patient Name:   Julian Fowler Date of Exam: 12/12/2023 Medical Rec #:  [id number]       Height:       69.0 in Accession #:    [id number]      Weight:       242.8 lb Date of Birth:  1935/03/23       BSA:          2.243 m Patient Age:    88 years        BP:           160/90 mmHg Patient Gender: M               HR:           88 bpm. Exam Location:  Inpatient Procedure: Limited Echo, Cardiac Doppler, Color Doppler and Intracardiac            Opacification Agent (Both Spectral and Color Flow Doppler were            utilized during procedure). Indications:    ; 122-I22.9 Subsequent ST elevation (STEM) and non-ST elevation                 (NSTEMI) myocardial infarction  History:        Patient has prior history of Echocardiogram examinations, most  recent 05/20/2023. Previous Myocardial Infarction, Acute MI and                 CAD, Abnormal ECG, Aortic Valve Disease, Arrythmias:Atrial                 Fibrillation,  Signs/Symptoms:Murmur; Risk Factors:Hypertension,                 Dyslipidemia, Sleep Apnea and Current Smoker. Aortic stenosis.  Sonographer:    Sheralyn Boatman RDCS Referring Phys: 46 [facility]  Sonographer Comments: Technically difficult study due to poor echo windows. Image acquisition challenging due to patient body habitus. Limited echo to assess wall motion. IMPRESSIONS  1. Left ventricular ejection fraction, by estimation, is 50 to 55%. The left ventricle has low normal function. The left ventricle has no regional wall motion abnormalities. There is moderate concentric left ventricular hypertrophy. Left ventricular diastolic parameters are indeterminate.  2. The mitral valve is degenerative. Trivial mitral valve regurgitation.  3. The aortic valve is tricuspid. There is moderate calcification of the aortic valve. Aortic valve regurgitation is not visualized. Mild to moderate aortic valve stenosis. Aortic valve area, by VTI measures 1.51 cm. Aortic valve mean gradient measures  12.0 mmHg. Aortic valve Vmax measures 2.40 m/s. Comparison(s): Prior images reviewed side by side. Slight decrease in stroke volume index and DVI; prior study did not use contrast. FINDINGS  Left Ventricle: Left ventricular ejection fraction, by estimation, is 50 to 55%. The left ventricle has low normal function. The left ventricle has no regional wall motion abnormalities. Definity contrast agent was given IV to delineate the left ventricular endocardial borders. There is moderate concentric left ventricular hypertrophy. Left ventricular diastolic parameters are indeterminate. Mitral Valve: The mitral valve is degenerative in appearance. Trivial mitral valve regurgitation. Tricuspid Valve: The tricuspid valve is normal in structure. Tricuspid valve regurgitation is not demonstrated. No evidence of tricuspid stenosis. Aortic Valve: The aortic valve is tricuspid. There is moderate calcification of the aortic valve. Aortic valve  regurgitation is not visualized. Mild to moderate aortic stenosis is present. Aortic valve mean gradient measures 12.0 mmHg. Aortic valve peak gradient measures 23.0 mmHg. Aortic valve area, by VTI measures 1.51 cm. Additional Comments: Spectral Doppler performed. Color Doppler performed.  LEFT VENTRICLE PLAX 2D LVIDd:         5.50 cm LVIDs:         4.10 cm LV PW:         1.30 cm LV IVS:        1.50 cm LVOT diam:     2.40 cm LV SV:         66 LV SV Index:   29 LVOT Area:     4.52 cm  LV Volumes (MOD) LV vol d, MOD A2C: 159.0 ml LV vol d, MOD A4C: 129.0 ml LV vol s, MOD A2C: 65.8 ml LV vol s, MOD A4C: 77.2 ml LV SV MOD A2C:     93.2 ml LV SV MOD A4C:     129.0 ml LV SV MOD BP:      73.0 ml IVC IVC diam: 2.00 cm LEFT ATRIUM         Index LA diam:    5.50 cm 2.45 cm/m  AORTIC VALVE AV Area (Vmax):    1.74 cm AV Area (Vmean):   1.76 cm AV Area (VTI):     1.51 cm AV Vmax:           240.00 cm/s AV Vmean:  160.000 cm/s AV VTI:            0.437 m AV Peak Grad:      23.0 mmHg AV Mean Grad:      12.0 mmHg LVOT Vmax:         92.40 cm/s LVOT Vmean:        62.400 cm/s LVOT VTI:          0.146 m LVOT/AV VTI ratio: 0.33  AORTA Ao Root diam: 3.90 cm TRICUSPID VALVE TR Peak grad:   30.5 mmHg TR Vmax:        276.00 cm/s  SHUNTS Systemic VTI:  0.15 m Systemic Diam: 2.40 cm Riley Lam MD Electronically signed by Riley Lam MD Signature Date/Time: 12/12/2023/1:19:38 PM    Final     Microbiology: Results for orders placed or performed in visit on 06/26/23  Microscopic Examination     Status: Abnormal   Collection Time: 06/26/23  2:39 PM   Urine  Result Value Ref Range Status   WBC, UA 11-30 (A) 0 - 5 /hpf Final   RBC, Urine 0-2 0 - 2 /hpf Final   Epithelial Cells (non renal) 0-10 0 - 10 /hpf Final   Bacteria, UA Few (A) None seen/Few Final    Labs: CBC: Recent Labs  Lab 12/14/23 0436 12/15/23 0348 12/16/23 0512 12/17/23 0623 12/18/23 0443  WBC 12.8* 13.5* 13.2* 9.3 11.5*  HGB  12.0* 11.2* 11.6* 10.8* 11.6*  HCT 34.8* 32.7* 33.8* 31.3* 33.4*  MCV 87.7 87.2 88.9 87.7 87.4  PLT 390 351 310 359 382   Basic Metabolic Panel: Recent Labs  Lab 12/13/23 0453 12/14/23 0436 12/15/23 0348 12/16/23 0512 12/17/23 0623 12/19/23 0442  NA 131* 128* 128* 127* 129* 126*  K 3.7 4.3 3.7 3.7 4.0 4.2  CL 98 96* 94* 95* 96* 98  CO2 24 27 23 22 23  20*  GLUCOSE 97 98 95 94 88 88  BUN 7* 7* 11 13 11 15   CREATININE 0.74 0.76 0.85 0.77 0.79 0.86  CALCIUM 8.4* 8.3* 8.3* 8.4* 8.2* 7.7*  MG  --   --   --   --  1.5* 1.9  PHOS 3.9 3.4 3.5 3.7 3.7  --    Liver Function Tests: Recent Labs  Lab 12/14/23 0436 12/15/23 0348 12/16/23 0512 12/17/23 0623 12/19/23 0442  AST  --   --   --   --  32  ALT  --   --   --   --  38  ALKPHOS  --   --   --   --  59  BILITOT  --   --   --   --  0.6  PROT  --   --   --   --  5.6*  ALBUMIN 2.6* 2.3* 2.4* 2.4* 2.5*   CBG: No results for input(s): "GLUCAP" in the last 168 hours.  Discharge time spent: greater than 30 minutes.  Signed: Thad Ranger, MD [facility] 12/19/2023

## 2023-12-19 NOTE — H&P (Deleted)
 Physical Medicine and Rehabilitation Admission H&P       HPI: Julian Fowler is an 89 year old right-handed male with history significant for chronic hyponatremia, asbestosis, hypertension, hyperlipidemia, GERD, PSVT/atrial fibrillation not on anticoagulation due to history of falls, cerebral vascular AV fistula, acoustic neuroma status post craniectomy for excision 1995, BPH, asthma, anxiety, ILD, OSA, bilateral total knee replacement, quit smoking 37 years ago.  Per chart review patient lives with spouse.  1 level home with one-step to entry.  He does have a laundry and work area in the basement.  Ambulatory with a straight point cane.  Patient initially presented to outside hospital [facility] 2/19 with severely elevated blood pressure complaints of fogginess, dizziness and lightheadedness.  He was started on treatment for hypertensive emergency including nitroglycerin infusion.  He was weaned off this to p.o. medications.  Course complicated by some agitation requiring Haldol.  Thought possibly secondary to sundowning had a negative MRI.  Urinalysis spacious for UTI as possibly contributing factors started on ceftriaxone.  Developed significant chest pain 2/25 EKG showed evidence of ST depression diffusely and troponin trend was 1800, at 2200.  He was transferred to [facility] for non-STEMI evaluation and cardiology services consulted.  TTE with LVEF of 50 to 55% and no RWMA.  Cardiology services Dr Remi Deter Mcdowell/Dr Herbie Baltimore recommended medical therapy Plavix monotherapy followed by low-dose aspirin after possibly 3 months.  He has had no further bouts of chest pain or shortness of breath.  Patient's mental status continued to improve suspect acute metabolic encephalopathy superimposed on underlying question mild dementia anxiety and UTI with completion of IV Rocephin.  He continued to remain on Seroquel.  Orthopedic services consulted 12/14/2023 after reported fall when patient's foot  got caught while working with therapy and lost his balance with assisted fall to the floor denying striking his head or loss of consciousness.  Patient's x-rays revealed closed displaced left distal fibula fracture as well as CT of left ankle 12/15/2023 did show a small trimalleolar fracture in the distal fibular component mildly displaced and no surgical intervention with follow-up or orthopedic services Dr. Samson Frederic and nonweightbearing to left lower extremity.  Patient was transition from a cam boot to a well-padded plaster posterior short leg splint with a stirrup.  CT of the left ankle was reviewed by orthopedic services and felt the fracture was in acceptable alignment in the splint and given his recent MI it was safest to manage the fracture conservatively in a splint followed by a cast.  He will continue to be nonweightbearing on the left.  He was weightbearing as tolerated on the right lower extremity.  No current plan for surgical intervention.  Patient with bouts of hyponatremia 127-131 and sodium chloride tablets as directed.  Therapy evaluations completed due to patient's decreased functional mobility was admitted for a comprehensive rehab program.   Review of Systems  Constitutional:  Negative for chills and fever.  HENT:  Positive for hearing loss.        Tinnitus  Eyes:  Negative for blurred vision and double vision.  Respiratory:  Positive for shortness of breath.   Cardiovascular:  Positive for chest pain and palpitations.  Gastrointestinal:  Positive for constipation. Negative for heartburn, nausea and vomiting.       GERD  Genitourinary:  Positive for urgency. Negative for dysuria, flank pain and hematuria.  Musculoskeletal:  Positive for falls and myalgias.  All other systems reviewed and are negative.  Past Medical History:  Diagnosis Date   Allergic rhinitis     Aortic stenosis     Arthritis     Atrial fibrillation (HCC)     BPH (benign prostatic hyperplasia)      Coronary artery calcification seen on CT scan     Easy bruisability     ED (erectile dysfunction)     Essential hypertension     GERD (gastroesophageal reflux disease)     H/O hiatal hernia     Hearing loss in left ear     History of asbestosis     History of shingles     Hyperlipidemia     Overactive bladder     PSVT (paroxysmal supraventricular tachycardia) (HCC)     Sleep apnea      No longer on CPAP following weight loss   Tinnitus               Past Surgical History:  Procedure Laterality Date   BACK SURGERY   2014    herniated L1, L2   Dr Channing Mutters   BIOPSY   09/18/2023    Procedure: BIOPSY;  Surgeon: Corbin Ade, MD;  Location: [facility];  Service: Endoscopy;;   BRAIN SURGERY       CEREBRAL EMBOLIZATION   12/2011    "radiation therapy-did not work"   CHOLECYSTECTOMY   6/98   COLONOSCOPY   09/2009    Dr. Cleotis Nipper: normal, internal hemorrhoids    COLONOSCOPY N/A 02/28/2017    Dr. Jena Gauss: Hemorrhoids, grade 3, mild diverticulosis.   CRANIECTOMY FOR EXCISION OF ACOUSTIC NEUROMA   3/95   ESOPHAGOGASTRODUODENOSCOPY (EGD) WITH PROPOFOL N/A 09/18/2023    Procedure: ESOPHAGOGASTRODUODENOSCOPY (EGD) WITH PROPOFOL;  Surgeon: Corbin Ade, MD;  Location: [facility];  Service: Endoscopy;  Laterality: N/A;  930am, asa 3   MINOR AMPUTATION OF DIGIT Left 12/31/2018    Procedure: REVISION AMPUTATION OF LEFT INDEX FINGER, IRRIGATION AND DEBRIDEMENT LEFT INDEX FINGER;  Surgeon: Ernest Mallick, MD;  Location: [facility] OR;  Service: Orthopedics;  Laterality: Left;   POLYPECTOMY   09/18/2023    Procedure: POLYPECTOMY;  Surgeon: Corbin Ade, MD;  Location: [facility];  Service: Endoscopy;;   RADIOLOGY WITH ANESTHESIA N/A 07/07/2014    Procedure: EMBOLIZATION;  Surgeon: Oneal Grout, MD;  Location: [facility] OR;  Service: Radiology;  Laterality: N/A;   TOTAL KNEE ARTHROPLASTY Left 07/06/2013    Procedure: LEFT TOTAL KNEE ARTHROPLASTY;  Surgeon: Loanne Drilling, MD;  Location: [facility]  ORS;  Service: Orthopedics;  Laterality: Left;   TOTAL KNEE ARTHROPLASTY Right 01/18/2014    Procedure: RIGHT TOTAL KNEE ARTHROPLASTY;  Surgeon: Loanne Drilling, MD;  Location: [facility] ORS;  Service: Orthopedics;  Laterality: Right;   TRIGGER FINGER RELEASE   2003    (thumb) middle finger (2006)             Family History  Problem Relation Age of Onset   Cancer Father          oral cancer   Breast cancer Mother     Heart attack Mother     Hypertension Sister          Bypass x4   Alcohol abuse Sister     Alzheimer's disease Sister     Kidney disease Sister     Colon cancer Neg Hx          Social History:  reports that he quit smoking about 37 years ago. His  smoking use included cigarettes. He started smoking about 66 years ago. He has a 45 pack-year smoking history. He has never been exposed to tobacco smoke. He has never used smokeless tobacco. He reports that he does not drink alcohol and does not use drugs. Allergies:  Allergies       Allergies  Allergen Reactions   Latex Itching   Bactrim [Sulfamethoxazole-Trimethoprim] Other (See Comments)      Unknown reaction   Prednisone Other (See Comments)      Pt is high functioning and out of sorts.   Codeine Other (See Comments)      Grogginess/drowsiness    Tape Other (See Comments)      Skin tears, use paper tape            Medications Prior to Admission  Medication Sig Dispense Refill   acetaminophen (TYLENOL) 325 MG tablet Take 325-650 mg by mouth 2 (two) times daily as needed for moderate pain (pain score 4-6), fever or headache.       azelastine (ASTELIN) 0.1 % nasal spray 1-2 puffs each nostril every 8 hours if needed for drainage 30 mL 12   Cholecalciferol (VITAMIN D-3 PO) Take 1 tablet by mouth daily with lunch.       esomeprazole (NEXIUM) 40 MG capsule Take 1 capsule (40 mg total) by mouth 2 (two) times daily before a meal. 180 capsule 3   hydrALAZINE (APRESOLINE) 50 MG tablet TAKE 1 & 1/2 (ONE & ONE-HALF) TABLETS BY  MOUTH THREE TIMES DAILY 405 tablet 2   latanoprost (XALATAN) 0.005 % ophthalmic solution Place 1 drop into both eyes at bedtime.        LORazepam (ATIVAN) 1 MG tablet TAKE 1 TABLET BY MOUTH THREE TIMES DAILY AS NEEDED FOR ANXIETY (Patient taking differently: Take 1 mg by mouth See admin instructions. Take 1 tablet (1mg ) by mouth nightly at bedtime for sleep. May take an additional 1 tablet twice daily as needed for anxiety.) 90 tablet 5   losartan (COZAAR) 100 MG tablet Take 100 mg by mouth daily.       memantine (NAMENDA) 5 MG tablet Take 5 mg by mouth 2 (two) times daily.       Multiple Vitamins-Minerals (CENTRUM SILVER 50+MEN) TABS Take 1 tablet by mouth daily.       Multiple Vitamins-Minerals (ZINC PO) Take 1 tablet by mouth daily with lunch.       nitrofurantoin (MACRODANTIN) 50 MG capsule Take 50 mg by mouth at bedtime.       Polyethyl Glycol-Propyl Glycol (SYSTANE OP) Place 1 drop into both eyes 2 (two) times daily as needed (dry eyes).       polyethylene glycol (MIRALAX / GLYCOLAX) packet Take 17 g by mouth at bedtime.       potassium chloride SA (KLOR-CON M) 20 MEQ tablet Take 1 tablet (20 mEq total) by mouth daily. 30 tablet 6   pravastatin (PRAVACHOL) 40 MG tablet Take 1 tablet by mouth at bedtime.        PROAIR HFA 108 (90 Base) MCG/ACT inhaler INHALE 2 PUFFS BY MOUTH EVERY 6 HOURS AS NEEDED FOR WHEEZING FOR SHORTNESS OF BREATH 18 g 12   silodosin (RAPAFLO) 8 MG CAPS capsule Take 1 capsule (8 mg total) by mouth at bedtime. 30 capsule 11   sodium chloride 1 g tablet Take 1 g by mouth 3 (three) times daily.       spironolactone (ALDACTONE) 25 MG tablet Take 0.5 tablets (12.5 mg total) by  mouth daily. 45 tablet 1   SYMBICORT 160-4.5 MCG/ACT inhaler INHALE 2 PUFFS BY MOUTH  TWICE DAILY THEN  RINSE  MOUTH (Patient taking differently: Inhale 2 puffs into the lungs 2 (two) times daily as needed (shortness of breath, wheezing). INHALE 2 PUFFS BY MOUTH  TWICE DAILY THEN  RINSE  MOUTH) 11 g 6    Wheat Dextrin (BENEFIBER PO) Take 1 Scoop by mouth 2 (two) times daily.       zolpidem (AMBIEN) 10 MG tablet Take 10 mg by mouth at bedtime.                  Home: Home Living Family/patient expects to be discharged to:: Private residence Living Arrangements: Spouse/significant other Available Help at Discharge: Family, Available 24 hours/day Type of Home: House Home Access: Stairs to enter Secretary/administrator of Steps: 1 Entrance Stairs-Rails: None Home Layout: Laundry or work area in basement [employer] Shower/Tub: Health visitor: Handicapped height Bathroom Accessibility: Yes Home Equipment: Agricultural consultant (2 wheels), [employer] - single point, Occupational hygienist (4 wheels), Shower seat - built in, [employer] - toilet, Grab bars - tub/shower   Functional History: Prior Function Prior Level of Function : Independent/Modified Independent Mobility Comments: ambulatory with SPC   Functional Status:  Mobility: Bed Mobility Overal bed mobility: Needs Assistance Bed Mobility: Sit to Supine Supine to sit: Used rails, HOB elevated, Mod assist Sit to supine: Mod assist, +2 for physical assistance General bed mobility comments: assistance with BLE and trunk Transfers Overall transfer level: Needs assistance Equipment used: Rolling walker (2 wheels), None Transfers: Sit to/from Stand, Bed to chair/wheelchair/BSC Sit to Stand: Max assist, +2 physical assistance, From elevated surface Bed to/from chair/wheelchair/BSC transfer type:: Squat pivot Squat pivot transfers: Max assist, +2 physical assistance General transfer comment: transfer from recliner to EOB with squat pivot transfer and max assist +2 with frequent cues for WB precautions. Sit to stand attempted from EOB with patient unable to come to complete stand Ambulation/Gait Ambulation/Gait assistance: Min assist (maxA with loss of balance resulting in fall, otherwise minA with fatigue) Gait Distance (Feet): 120 Feet  (additional trial of 5') Assistive device: Straight cane, 1 person hand held assist Gait Pattern/deviations: Step-to pattern, Wide base of support General Gait Details: unable to maintain NWB on L LE with coming to standing Gait velocity: reduced Gait velocity interpretation: <1.8 ft/sec, indicate of risk for recurrent falls   ADL: ADL Overall ADL's : Needs assistance/impaired Eating/Feeding: Set up, Bed level Grooming: Set up Upper Body Bathing: Moderate assistance, Bed level, Sitting Lower Body Bathing: +2 for physical assistance, Bed level, Maximal assistance, +2 for safety/equipment Upper Body Dressing : Moderate assistance Lower Body Dressing: Total assistance, Maximal assistance, +2 for physical assistance, +2 for safety/equipment, Bed level General ADL Comments: focused on transfer to bed and bed mobility   Cognition: Cognition Orientation Level: Oriented X4 Cognition Arousal: Alert Behavior During Therapy: Impulsive   Physical Exam: Blood pressure (!) 148/86, pulse 97, temperature 98 F (36.7 C), temperature source Oral, resp. rate 20, height 5\' 9"  (1.753 m), weight 110.1 kg, SpO2 96%. Physical Exam Constitutional: No apparent distress. Appropriate appearance for age.  Laying in bed HENT: No JVD. Neck Supple. Trachea midline. Atraumatic, normocephalic.  Hard of hearing, with bilateral hearing aids. Eyes: PERRLA. EOMI. Visual fields grossly intact.  Glasses donned. Cardiovascular: RRR, no murmurs/rub/gallops. No Edema.  Brisk capillary refill Respiratory: CTAB. No rales, rhonchi, or wheezing. On RA.  Abdomen: + bowel sounds, normoactive. No distention or tenderness.  GU: Not examined. + External catheter, draining clear urine.  Skin: Bilateral upper extremity bruising Left lower extremity splint clean/dry/intact  MSK:     + LLE splint.  Full active range of motion all other extremities.       Neurologic exam:  Cognition: AAO to person, place, time and event.  +  Endorsing hallucinations, seeing the wall move at nighttime Language: Fluent, No substitutions or neoglisms. No dysarthria. Names 3/3 objects correctly.  Memory: Recalls 1/3 objects at 5 minutes. + Deficits, chronic per wife, also new  Insight: Fair insight into current condition.  Mood: Pleasant affect, appropriate mood.  Sensation: To light touch intact absent in left toes. Reflexes: 2+ in BL UE and LEs. Negative Hoffman's laterally. CN: 2-12 grossly intact.  Coordination: No apparent tremors. No ataxia on FTN      Strength:                RUE: 5/5 SA, 5/5 EF, 5/5 EE, 5/5 WE, 5/5 FF, 5/5 FA                LUE:  5/5 SA, 5/5 EF, 5/5 EE, 5/5 WE, 5/5 FF, 5/5 FA                RLE: 4/5 HF, 5/5 KE, 5/5  DF, 5/5  EHL, 5/5  PF                 LLE:  5/5 HF, 5/5 KE, 1/5  DF, 1/5  EHL, 1/5  PF         Lab Results Last 48 Hours        Results for orders placed or performed during the hospital encounter of 12/11/23 (from the past 48 hours)  CBC     Status: Abnormal    Collection Time: 12/17/23  6:23 AM  Result Value Ref Range    WBC 9.3 4.0 - 10.5 K/uL    RBC 3.57 (L) 4.22 - 5.81 MIL/uL    Hemoglobin 10.8 (L) 13.0 - 17.0 g/dL    HCT 40.9 (L) 81.1 - 52.0 %    MCV 87.7 80.0 - 100.0 fL    MCH 30.3 26.0 - 34.0 pg    MCHC 34.5 30.0 - 36.0 g/dL    RDW 91.4 78.2 - 95.6 %    Platelets 359 150 - 400 K/uL    nRBC 0.0 0.0 - 0.2 %      Comment: Performed at [facility], [address], [place], [place] [address]  Renal function panel     Status: Abnormal    Collection Time: 12/17/23  6:23 AM  Result Value Ref Range    Sodium 129 (L) 135 - 145 mmol/L    Potassium 4.0 3.5 - 5.1 mmol/L    Chloride 96 (L) 98 - 111 mmol/L    CO2 23 22 - 32 mmol/L    Glucose, Bld 88 70 - 99 mg/dL      Comment: Glucose reference range applies only to samples taken after fasting for at least 8 hours.    BUN 11 8 - 23 mg/dL    Creatinine, Ser 6.57 0.61 - 1.24 mg/dL    Calcium 8.2 (L) 8.9 - 10.3 mg/dL     Phosphorus 3.7 2.5 - 4.6 mg/dL    Albumin 2.4 (L) 3.5 - 5.0 g/dL    GFR, Estimated >84 >69 mL/min      Comment: (NOTE) Calculated using the CKD-EPI Creatinine Equation (2021)  Anion gap 10 5 - 15      Comment: Performed at [facility], [address], [place], [place] [address]  Magnesium     Status: Abnormal    Collection Time: 12/17/23  6:23 AM  Result Value Ref Range    Magnesium 1.5 (L) 1.7 - 2.4 mg/dL      Comment: Performed at [facility], [address], [place], [place] [address]  CBC     Status: Abnormal    Collection Time: 12/18/23  4:43 AM  Result Value Ref Range    WBC 11.5 (H) 4.0 - 10.5 K/uL    RBC 3.82 (L) 4.22 - 5.81 MIL/uL    Hemoglobin 11.6 (L) 13.0 - 17.0 g/dL    HCT 10.9 (L) 32.3 - 52.0 %    MCV 87.4 80.0 - 100.0 fL    MCH 30.4 26.0 - 34.0 pg    MCHC 34.7 30.0 - 36.0 g/dL    RDW 55.7 32.2 - 02.5 %    Platelets 382 150 - 400 K/uL    nRBC 0.0 0.0 - 0.2 %      Comment: Performed at [facility], [address], [place], [place] [address]      Imaging Results (Last 48 hours)  No results found.         Blood pressure (!) 148/86, pulse 97, temperature 98 F (36.7 C), temperature source Oral, resp. rate 20, height 5\' 9"  (1.753 m), weight 110.1 kg, SpO2 96%.   Medical Problem List and Plan: 1. Functional deficits secondary to debility after non-STEMI with conservative care complicated by left fibular fracture after fall.NWB.  Follow-up outpatient orthopedic services DR Victorino Dike to consider need for surgical intervention             -patient may shower with cast covered              -ELOS/Goals: 18 to 21 days, supervision level PT/OT/SLP  -Admit to inpatient rehab 2.  Antithrombotics: -DVT/anticoagulation.  Subcutaneous heparin initiated 12/18/2023             -antiplatelet therapy: Plavix 75 mg daily 3. Pain Management: Voltaren gel 4 times daily, tramadol as needed 4. Mood/Behavior/Sleep: Ativan 0.5 mg 3 times daily as needed.  Provide  emotional support             -antipsychotic agents: Seroquel 50 mg nightly  -delirium precautions 5. Neuropsych/cognition: This patient is not capable of making decisions on his own behalf. 6. Skin/Wound Care: Routine skin checks 7. Fluids/Electrolytes/Nutrition: Routine in and outs with follow-up chemistries 8.  Left oblique fibular fracture.  Nonweightbearing.  Transition from cam boot to well-padded plastic posterior short leg splint with stirrup.  No current plan for surgical intervention 9.  Hypertension.  Norvasc 10 mg daily, hydralazine 100 mg 3 times daily, Avapro 150 mg daily, Imdur 60 mg daily, Aldactone 25 mg daily.  Monitor with increased mobility 10.  OSA/COPD, continue inhalers as directed.  Monitor oxygen saturations 11.  Chronic hyponatremia.  Sodium chloride tablets.  Follow-up chemistries 12.  Hyperlipidemia.  Pravachol 13.  GERD.  Protonix   Charlton Amor, PA-C 12/18/2023

## 2023-12-19 NOTE — TOC Transition Note (Addendum)
 Transition of Care [facility]) - Discharge Note   Patient Details  Name: Julian Fowler MRN: [id number] Date of Birth: 1935-01-24  Transition of Care [facility]) CM/SW Contact:  Gala Lewandowsky, RN Phone Number: 12/19/2023, 11:57 AM   Clinical Narrative:  Patient has a CIR bed available -will transition to CIR today. [facility] to follow post rehab. No further needs identified at this time.   Final next level of care: IP Rehab Facility Barriers to Discharge: No Barriers Identified  Patient Goals and CMS Choice Patient states their goals for this hospitalization and ongoing recovery are:: patient wants to return home once stable CMS Medicare.gov Compare Post Acute Care list provided to:: Patient Choice offered to / list presented to : Patient, Spouse  Discharge Plan and Services Additional resources added to the After Visit Summary for     Discharge Planning Services: CM Consult Post Acute Care Choice: Home Health              HH Arranged: PT [facility] Agency: [facility] Date [place] Agency Contacted: 12/13/23 Time HH Agency Contacted: 1622 Representative spoke with at [facility] Agency: Elnita Maxwell  Social Drivers of Health (SDOH) Interventions SDOH Screenings   Food Insecurity: No Food Insecurity (12/11/2023)  Housing: Low Risk  (12/11/2023)  Transportation Needs: No Transportation Needs (12/11/2023)  Utilities: Not At Risk (12/11/2023)  Financial Resource Strain: Low Risk  (12/05/2023)   Received from [facility]  Physical Activity: Inactive (12/05/2023)   Received from [facility]  Social Connections: Socially Integrated (12/11/2023)  Stress: No Stress Concern Present (12/05/2023)   Received from [facility]  Tobacco Use: Medium Risk (12/12/2023)  Health Literacy: Low Risk  (12/05/2023)   Received from [facility]   Readmission Risk Interventions     No data to display

## 2023-12-19 NOTE — Plan of Care (Signed)
  Problem: Education: Goal: Knowledge of General Education information will improve Description: Including pain rating scale, medication(s)/side effects and non-pharmacologic comfort measures Outcome: Progressing   Problem: Health Behavior/Discharge Planning: Goal: Ability to manage health-related needs will improve Outcome: Progressing   Problem: Clinical Measurements: Goal: Ability to maintain clinical measurements within normal limits will improve Outcome: Progressing Goal: Will remain free from infection Outcome: Progressing Goal: Diagnostic test results will improve Outcome: Progressing Goal: Respiratory complications will improve Outcome: Progressing Goal: Cardiovascular complication will be avoided Outcome: Progressing   Problem: Activity: Goal: Risk for activity intolerance will decrease Outcome: Progressing   Problem: Nutrition: Goal: Adequate nutrition will be maintained Outcome: Progressing   Problem: Coping: Goal: Level of anxiety will decrease Outcome: Progressing   Problem: Elimination: Goal: Will not experience complications related to bowel motility Outcome: Progressing Goal: Will not experience complications related to urinary retention Outcome: Progressing   Problem: Pain Managment: Goal: General experience of comfort will improve and/or be controlled Outcome: Progressing   Problem: Safety: Goal: Ability to remain free from injury will improve Outcome: Progressing   Problem: Skin Integrity: Goal: Risk for impaired skin integrity will decrease Outcome: Progressing   Problem: Education: Goal: Knowledge of disease or condition will improve Outcome: Progressing Goal: Understanding of medication regimen will improve Outcome: Progressing Goal: Individualized Educational Video(s) Outcome: Progressing   Problem: Activity: Goal: Ability to tolerate increased activity will improve Outcome: Progressing   Problem: Cardiac: Goal: Ability to achieve  and maintain adequate cardiopulmonary perfusion will improve Outcome: Progressing   Problem: Health Behavior/Discharge Planning: Goal: Ability to safely manage health-related needs after discharge will improve Outcome: Progressing   Problem: Education: Goal: Understanding of cardiac disease, CV risk reduction, and recovery process will improve Outcome: Progressing Goal: Individualized Educational Video(s) Outcome: Progressing   Problem: Activity: Goal: Ability to tolerate increased activity will improve Outcome: Progressing   Problem: Cardiac: Goal: Ability to achieve and maintain adequate cardiovascular perfusion will improve Outcome: Progressing   Problem: Health Behavior/Discharge Planning: Goal: Ability to safely manage health-related needs after discharge will improve Outcome: Progressing   Problem: Education: Goal: Understanding of CV disease, CV risk reduction, and recovery process will improve Outcome: Progressing Goal: Individualized Educational Video(s) Outcome: Progressing   Problem: Activity: Goal: Ability to return to baseline activity level will improve Outcome: Progressing   Problem: Cardiovascular: Goal: Ability to achieve and maintain adequate cardiovascular perfusion will improve Outcome: Progressing Goal: Vascular access site(s) Level 0-1 will be maintained Outcome: Progressing   Problem: Health Behavior/Discharge Planning: Goal: Ability to safely manage health-related needs after discharge will improve Outcome: Progressing

## 2023-12-19 NOTE — Care Management Important Message (Signed)
 Important Message  Patient Details  Name: Julian Fowler MRN: [id number] Date of Birth: 25-Sep-1935   Important Message Given:  Yes - Medicare IM     Renie Ora 12/19/2023, 12:38 PM

## 2023-12-20 DIAGNOSIS — R5381 Other malaise: Principal | ICD-10-CM

## 2023-12-20 LAB — RENAL FUNCTION PANEL
Albumin: 2.5 g/dL — ABNORMAL LOW (ref 3.5–5.0)
Anion gap: 11 (ref 5–15)
BUN: 15 mg/dL (ref 8–23)
CO2: 21 mmol/L — ABNORMAL LOW (ref 22–32)
Calcium: 8.3 mg/dL — ABNORMAL LOW (ref 8.9–10.3)
Chloride: 96 mmol/L — ABNORMAL LOW (ref 98–111)
Creatinine, Ser: 0.91 mg/dL (ref 0.61–1.24)
GFR, Estimated: >60 mL/min (ref >60)
Glucose, Bld: 98 mg/dL (ref 70–99)
Phosphorus: 4.1 mg/dL (ref 2.5–4.6)
Potassium: 4.5 mmol/L (ref 3.5–5.1)
Sodium: 128 mmol/L — ABNORMAL LOW (ref 135–145)

## 2023-12-20 LAB — CBC WITH DIFFERENTIAL/PLATELET
Abs Immature Granulocytes: 0.07 10*3/uL (ref 0.00–0.07)
Basophils Absolute: 0.1 10*3/uL (ref 0.0–0.1)
Basophils Relative: 1 %
Eosinophils Absolute: 0.6 10*3/uL — ABNORMAL HIGH (ref 0.0–0.5)
Eosinophils Relative: 6 %
HCT: 33.7 % — ABNORMAL LOW (ref 39.0–52.0)
Hemoglobin: 11.5 g/dL — ABNORMAL LOW (ref 13.0–17.0)
Immature Granulocytes: 1 %
Lymphocytes Relative: 10 %
Lymphs Abs: 1 10*3/uL (ref 0.7–4.0)
MCH: 29.8 pg (ref 26.0–34.0)
MCHC: 34.1 g/dL (ref 30.0–36.0)
MCV: 87.3 fL (ref 80.0–100.0)
Monocytes Absolute: 0.9 10*3/uL (ref 0.1–1.0)
Monocytes Relative: 10 %
Neutro Abs: 7.2 10*3/uL (ref 1.7–7.7)
Neutrophils Relative %: 72 %
Platelets: 407 10*3/uL — ABNORMAL HIGH (ref 150–400)
RBC: 3.86 MIL/uL — ABNORMAL LOW (ref 4.22–5.81)
RDW: 13.5 % (ref 11.5–15.5)
WBC: 9.9 10*3/uL (ref 4.0–10.5)
nRBC: 0 % (ref 0.0–0.2)

## 2023-12-20 LAB — COMPREHENSIVE METABOLIC PANEL
ALT: 38 U/L (ref 0–44)
AST: 32 U/L (ref 15–41)
Albumin: 2.5 g/dL — ABNORMAL LOW (ref 3.5–5.0)
Alkaline Phosphatase: 62 U/L (ref 38–126)
Anion gap: 7 (ref 5–15)
BUN: 15 mg/dL (ref 8–23)
CO2: 24 mmol/L (ref 22–32)
Calcium: 8.3 mg/dL — ABNORMAL LOW (ref 8.9–10.3)
Chloride: 97 mmol/L — ABNORMAL LOW (ref 98–111)
Creatinine, Ser: 0.95 mg/dL (ref 0.61–1.24)
GFR, Estimated: >60 mL/min (ref >60)
Glucose, Bld: 99 mg/dL (ref 70–99)
Potassium: 4.6 mmol/L (ref 3.5–5.1)
Sodium: 128 mmol/L — ABNORMAL LOW (ref 135–145)
Total Bilirubin: 0.8 mg/dL (ref 0.0–1.2)
Total Protein: 5.8 g/dL — ABNORMAL LOW (ref 6.5–8.1)

## 2023-12-20 MED ORDER — VITAMIN D 25 MCG (1000 UNIT) PO TABS
1000.0000 [IU] | ORAL_TABLET | Freq: Every day | ORAL | Status: DC
  Administered 2023-12-20 – 2023-12-21 (×2): 1000 [IU] via ORAL
  Filled 2023-12-20 (×2): qty 1

## 2023-12-20 MED ORDER — MAGNESIUM GLUCONATE 500 MG PO TABS
250.0000 mg | ORAL_TABLET | Freq: Every day | ORAL | Status: DC
  Administered 2023-12-20: 250 mg via ORAL
  Filled 2023-12-20: qty 1

## 2023-12-20 MED ORDER — ZINC SULFATE 220 (50 ZN) MG PO CAPS
220.0000 mg | ORAL_CAPSULE | Freq: Every day | ORAL | Status: DC
  Administered 2023-12-20 – 2023-12-21 (×2): 220 mg via ORAL
  Filled 2023-12-20 (×2): qty 1

## 2023-12-20 MED ORDER — MEMANTINE HCL 10 MG PO TABS
5.0000 mg | ORAL_TABLET | Freq: Two times a day (BID) | ORAL | Status: DC
Start: 2023-12-20 — End: 2023-12-21
  Administered 2023-12-20 – 2023-12-21 (×3): 5 mg via ORAL
  Filled 2023-12-20 (×4): qty 1

## 2023-12-20 MED ORDER — KATE FARMS STANDARD 1.4 PO LIQD
325.0000 mL | Freq: Two times a day (BID) | ORAL | Status: DC
  Filled 2023-12-20 (×4): qty 325

## 2023-12-20 MED ORDER — AMLODIPINE BESYLATE 5 MG PO TABS
5.0000 mg | ORAL_TABLET | Freq: Every day | ORAL | Status: DC
  Administered 2023-12-21: 5 mg via ORAL
  Filled 2023-12-20: qty 1

## 2023-12-20 MED ORDER — NITROFURANTOIN MACROCRYSTAL 50 MG PO CAPS
50.0000 mg | ORAL_CAPSULE | Freq: Every day | ORAL | Status: DC
Start: 2023-12-20 — End: 2023-12-21
  Administered 2023-12-20 – 2023-12-21 (×2): 50 mg via ORAL
  Filled 2023-12-20 (×2): qty 1

## 2023-12-20 MED ORDER — ONDANSETRON 4 MG PO TBDP
4.0000 mg | ORAL_TABLET | Freq: Three times a day (TID) | ORAL | Status: DC | PRN
  Administered 2023-12-20: 4 mg via ORAL
  Filled 2023-12-20: qty 1

## 2023-12-20 MED ORDER — ACETAMINOPHEN 500 MG PO TABS
1000.0000 mg | ORAL_TABLET | Freq: Three times a day (TID) | ORAL | Status: DC
  Administered 2023-12-20 – 2023-12-21 (×4): 1000 mg via ORAL
  Filled 2023-12-20 (×4): qty 2

## 2023-12-20 NOTE — Progress Notes (Addendum)
 Occupational Therapy Session Note  Patient Details  Name: Julian Fowler MRN: [id number] Date of Birth: [birth year]  Today's Date: 12/20/2023 OT Individual Time: 6644-0347 OT Individual Time Calculation (min): 60 min  OT missed time: 15 min Missed time reason: fatigue    Short Term Goals: Week 1:  OT Short Term Goal 1 (Week 1): Pt will complete toilet transfer with min A using LRAD OT Short Term Goal 2 (Week 1): Pt will complete 1/3 toileting steps with CGA for balance OT Short Term Goal 3 (Week 1): Pt will complete sit > stand in prep for ADL with max A of 1 while maintaining NWB precautions on LLE OT Short Term Goal 4 (Week 1): Pt will thread LB clothing with supervision using AE PRN  Skilled Therapeutic Interventions/Progress Updates:  Skilled OT intervention completed with focus on activity tolerance, blocked practice sit > stands. Pt received semi supine with NT assessing vitals. Pt initially not agreeable to session, but with encouragement about goals to get home, pt agreeable to session. LLE pain reported when in dependent position; pt declined pain meds stating "I don't like meds." OT offered topical ice, however pain improved with elevation and declined any other intervention.  NT present for vitals with BP noted to be soft. See BP vitals below; MD notified of status for potential med adjustment as pt also symptomatic. Transitioned supine > sit with supervision and heavy use of bed rails. Donned RLE sock and shoe in prep for sit > stand trial. Retrieved bariatric RW for wider width. From slightly elevated bed, pt required 2 trials, but was eventually able to stand with mod/max A +2, with improved ability to NWB on LLE as compared to the stedy in AM, however not 100%. Cues needed for letting LLE rest on floor due to weigh of the splint and overall weakness, but pt with difficulty not pushing through foot, as OT had LLE propped on therapists foot. Attempted 3 more trials, with pt requiring  prior assist each trial using RW. Once in stance, pt was able to stand with mod A of 1, however 2nd person for safety as pt would plop himself back on bed with poor eccentric control.  Pt with "hot and sweaty" symptoms and c/o fatigue, requesting to rest. Transitioned sit > supine with CGA. +2 needed to boost > HOB with bed in trendelenburg position. Doffed pants with max A with pt able to single leg bridge with RLE only. Nurse present for meds and notified of status. Per nursing clearance, OT issued pt chocolate icecream per request. Pt remained semi upright in bed, with bed alarm on/activated, and with all needs in reach at end of session. Pt missed 15 mins of OT intervention secondary to fatigue; OT will make up missed time as able.   Vitals 101/57- supine 98/65- sitting EOB; symptomatic 89/54- in sitting after 1 sit > stand; symptomatic 96/79- in sitting after extended rest   Therapy Documentation Precautions:  Precautions Precautions: Fall Required Braces or Orthoses: Other Brace Other Brace: L posterior LE splint with stirup Restrictions Weight Bearing Restrictions Per Provider Order: Yes LLE Weight Bearing Per Provider Order: Non weight bearing    Therapy/Group: Individual Therapy  Melvyn Novas, MS, OTR/L  12/20/2023, 3:25 PM

## 2023-12-20 NOTE — Plan of Care (Signed)
  Problem: RH Balance Goal: LTG Patient will maintain dynamic standing with ADLs (OT) Description: LTG:  Patient will maintain dynamic standing balance with assist during activities of daily living (OT)  Flowsheets (Taken 12/20/2023 1201) LTG: Pt will maintain dynamic standing balance during ADLs with: Contact Guard/Touching assist   Problem: Sit to Stand Goal: LTG:  Patient will perform sit to stand in prep for activites of daily living with assistance level (OT) Description: LTG:  Patient will perform sit to stand in prep for activites of daily living with assistance level (OT) Flowsheets (Taken 12/20/2023 1201) LTG: PT will perform sit to stand in prep for activites of daily living with assistance level: Contact Guard/Touching assist   Problem: RH Bathing Goal: LTG Patient will bathe all body parts with assist levels (OT) Description: LTG: Patient will bathe all body parts with assist levels (OT) Flowsheets (Taken 12/20/2023 1201) LTG: Pt will perform bathing with assistance level/cueing: Supervision/Verbal cueing   Problem: RH Dressing Goal: LTG Patient will perform lower body dressing w/assist (OT) Description: LTG: Patient will perform lower body dressing with assist, with/without cues in positioning using equipment (OT) Flowsheets (Taken 12/20/2023 1201) LTG: Pt will perform lower body dressing with assistance level of: Minimal Assistance - Patient > 75%   Problem: RH Toileting Goal: LTG Patient will perform toileting task (3/3 steps) with assistance level (OT) Description: LTG: Patient will perform toileting task (3/3 steps) with assistance level (OT)  Flowsheets (Taken 12/20/2023 1201) LTG: Pt will perform toileting task (3/3 steps) with assistance level: Minimal Assistance - Patient > 75%   Problem: RH Toilet Transfers Goal: LTG Patient will perform toilet transfers w/assist (OT) Description: LTG: Patient will perform toilet transfers with assist, with/without cues using equipment  (OT) Flowsheets (Taken 12/20/2023 1201) LTG: Pt will perform toilet transfers with assistance level of: Contact Guard/Touching assist   Problem: RH Tub/Shower Transfers Goal: LTG Patient will perform tub/shower transfers w/assist (OT) Description: LTG: Patient will perform tub/shower transfers with assist, with/without cues using equipment (OT) Flowsheets (Taken 12/20/2023 1201) LTG: Pt will perform tub/shower stall transfers with assistance level of: Minimal Assistance - Patient > 75%   Problem: RH Memory Goal: LTG Patient will demonstrate ability for day to day recall/carry over during activities of daily living with assistance level (OT) Description: LTG:  Patient will demonstrate ability for day to day recall/carry over during activities of daily living with assistance level (OT). Flowsheets (Taken 12/20/2023 1201) LTG:  Patient will demonstrate ability for day to day recall/carry over during activities of daily living with assistance level (OT): Supervision

## 2023-12-20 NOTE — Progress Notes (Signed)
 Patient ID: Julian Fowler, male   DOB: Mar 16, 1935, 89 y.o.   MRN: [id number] Met with the patient and wife to review current medical situation, rehab process, team conference and plan of care. Patient very HOH; open faced masks given to staff providing care for this patient; does pretty good if you are close and on the right side, does prefer to lip read.  Patient reporting pain in left leg and hx of TMJ; taking tylenol prn.  Reports constipation addressed. Reviewed medications for secondary risk management including hyponatremia; NA tab and HTN with recent NSTEMI. Discussed home regimen; wife expressed concern that patient was not on home meds (calcium, zinc and Vit D); MD made aware. Also noted lactose intolerance; added comment to diet orders. Active My Chart.  Continue to follow along to address educational needs to facilitate preparation for discharge. Pamelia Hoit

## 2023-12-20 NOTE — Progress Notes (Addendum)
 Inpatient Rehabilitation Admission/Discharge Medication Review by a Pharmacist  A complete drug regimen review was completed for this patient to identify any potential clinically significant medication issues.  High Risk Drug Classes Is patient taking? Indication by Medication  Antipsychotic Yes Quetiapine -anxiety  Anticoagulant Yes Heparin SQ - VTE prophylaxis  Antibiotic Yes Nitrofuratoin - UTI  Opioid Yes Tramadol - pain   Antiplatelet Yes Plavix - NSTEMI, coronary artery calcification  Hypoglycemics/insulin No   Vasoactive Medication Yes  Amlodipine, irbesartan,  hydralazine, imdur, spironolactone- HTN  Chemotherapy No   Other Yes Acetaminophen , diclofenac gel - pain  Breo Ellipta, Albuterol- COPD/ ILD Lorazepam - anxiety Namenda-dementia NTG sublingual- chest pain Mucinex- expectorant Ondansetron -nausea, vomiting Pantoprazole -GERD Pravastatin - HLD  Sodium chloride tablets-  sodium supplement  Zinc, Vitamin D , magnesium gluconate- supplements     Type of Medication Issue Identified Description of Issue Recommendation(s)  Drug Interaction(s) (clinically significant)     Duplicate Therapy     Allergy     No Medication Administration End Date     Incorrect Dose     Additional Drug Therapy Needed     Significant med changes from prior encounter (inform family/care partners about these prior to discharge).    Other  Cardiology recommended Plavix monotherapy at least for 1 months (preferably for 3 months) followed by low-dose aspirin 81 mg daily.  Continue statin  Communicate to patient /family/ caregiver prior to discharge.     Clinically significant medication issues were identified that warrant physician communication and completion of prescribed/recommended actions by midnight of the next day:  No  Name of provider notified for urgent issues identified:   Provider Method of Notification:     Pharmacist comments:   Time spent performing this drug regimen  review (minutes):     Thank you for allowing pharmacy to be part of this patients care team.   Noah Delaine, RPh Clinical Pharmacist 12/20/2023 2:27 PM

## 2023-12-20 NOTE — Evaluation (Signed)
 Occupational Therapy Assessment and Plan  Patient Details  Name: MARRIS FRONTERA MRN: [id number] Date of Birth: 10-26-1934  OT Diagnosis: acute pain, cognitive deficits, muscle weakness (generalized), swelling of limb, and BLE pain Rehab Potential: Rehab Potential (ACUTE ONLY): Good ELOS: 2 weeks   Today's Date: 12/20/2023 OT Individual Time: 4098-1191 OT Individual Time Calculation (min): 55 min     Hospital Problem: Principal Problem:   Debility   Past Medical History:  Past Medical History:  Diagnosis Date   Allergic rhinitis    Aortic stenosis    Arthritis    Atrial fibrillation (HCC)    BPH (benign prostatic hyperplasia)    Coronary artery calcification seen on CT scan    Easy bruisability    ED (erectile dysfunction)    Essential hypertension    GERD (gastroesophageal reflux disease)    H/O hiatal hernia    Hearing loss in left ear    History of asbestosis    History of shingles    Hyperlipidemia    Overactive bladder    PSVT (paroxysmal supraventricular tachycardia) (HCC)    Sleep apnea    No longer on CPAP following weight loss   Tinnitus    Past Surgical History:  Past Surgical History:  Procedure Laterality Date   BACK SURGERY  2014   herniated L1, L2   Dr Channing Mutters   BIOPSY  09/18/2023   Procedure: BIOPSY;  Surgeon: Corbin Ade, MD;  [facility]: [facility];  Service: Endoscopy;;   BRAIN SURGERY     CEREBRAL EMBOLIZATION  12/2011   "radiation therapy-did not work"   CHOLECYSTECTOMY  6/98   COLONOSCOPY  09/2009   Dr. Cleotis Nipper: normal, internal hemorrhoids    COLONOSCOPY N/A 02/28/2017   Dr. Jena Gauss: Hemorrhoids, grade 3, mild diverticulosis.   CRANIECTOMY FOR EXCISION OF ACOUSTIC NEUROMA  3/95   ESOPHAGOGASTRODUODENOSCOPY (EGD) WITH PROPOFOL N/A 09/18/2023   Procedure: ESOPHAGOGASTRODUODENOSCOPY (EGD) WITH PROPOFOL;  Surgeon: Corbin Ade, MD;  Location: [facility];  Service: Endoscopy;  Laterality: N/A;  930am, asa 3   MINOR AMPUTATION OF DIGIT Left  12/31/2018   Procedure: REVISION AMPUTATION OF LEFT INDEX FINGER, IRRIGATION AND DEBRIDEMENT LEFT INDEX FINGER;  Surgeon: Ernest Mallick, MD;  Location: [facility];  Service: Orthopedics;  Laterality: Left;   POLYPECTOMY  09/18/2023   Procedure: POLYPECTOMY;  Surgeon: Corbin Ade, MD;  Location: [facility];  Service: Endoscopy;;   RADIOLOGY WITH ANESTHESIA N/A 07/07/2014   Procedure: EMBOLIZATION;  Surgeon: Oneal Grout, MD;  Location: [facility];  Service: Radiology;  Laterality: N/A;   TOTAL KNEE ARTHROPLASTY Left 07/06/2013   Procedure: LEFT TOTAL KNEE ARTHROPLASTY;  Surgeon: Loanne Drilling, MD;  Location: [facility];  Service: Orthopedics;  Laterality: Left;   TOTAL KNEE ARTHROPLASTY Right 01/18/2014   Procedure: RIGHT TOTAL KNEE ARTHROPLASTY;  Surgeon: Loanne Drilling, MD;  Location: [facility];  Service: Orthopedics;  Laterality: Right;   TRIGGER FINGER RELEASE  2003   (thumb) middle finger (2006)    Assessment & Plan Clinical Impression:  Suliman Termini. Cohen is an 89 year old right-handed male with history significant for chronic hyponatremia, asbestosis, hypertension, hyperlipidemia, GERD, PSVT/atrial fibrillation not on anticoagulation due to history of falls, cerebral vascular AV fistula, acoustic neuroma status post craniectomy for excision 1995, BPH, asthma, anxiety, ILD, OSA, bilateral total knee replacement, quit smoking 37 years ago. Per chart review patient lives with spouse. 1 level home with one-step to entry. He does have a Pharmacologist and work  area in the basement. Ambulatory with a straight point cane. Patient initially presented to outside hospital [facility]/19 with severely elevated blood pressure complaints of fogginess, dizziness and lightheadedness. He was started on treatment for hypertensive emergency including nitroglycerin infusion. He was weaned off this to p.o. medications. Course complicated by some agitation requiring Haldol. Thought possibly secondary to sundowning  had a negative MRI. Urinalysis spacious for UTI as possibly contributing factors started on ceftriaxone. Developed significant chest pain 2/25 EKG showed evidence of ST depression diffusely and troponin trend was 1800, at 2200. He was transferred to [facility] for non-STEMI evaluation and cardiology services consulted. TTE with LVEF of 50 to 55% and no RWMA. Cardiology services Dr Remi Deter Mcdowell/Dr Herbie Baltimore recommended medical therapy Plavix monotherapy followed by low-dose aspirin after possibly 3 months. He has had no further bouts of chest pain or shortness of breath. Patient's mental status continued to improve suspect acute metabolic encephalopathy superimposed on underlying question mild dementia anxiety and UTI with completion of IV Rocephin. He continued to remain on Seroquel. Orthopedic services consulted 12/14/2023 after reported fall when patient's foot got caught while working with therapy and lost his balance with assisted fall to the floor denying striking his head or loss of consciousness. Patient's x-rays revealed closed displaced left distal fibula fracture as well as CT of left ankle 12/15/2023 did show a small trimalleolar fracture in the distal fibular component mildly displaced and no surgical intervention with follow-up or orthopedic services Dr. Samson Frederic and nonweightbearing to left lower extremity. Patient was transition from a cam boot to a well-padded plaster posterior short leg splint with a stirrup. CT of the left ankle was reviewed by orthopedic services and felt the fracture was in acceptable alignment in the splint and given his recent MI it was safest to manage the fracture conservatively in a splint followed by a cast. He will continue to be nonweightbearing on the left. He was weightbearing as tolerated on the right lower extremity. No current plan for surgical intervention. Patient with bouts of hyponatremia 127-131 and sodium chloride tablets as directed. Therapy  evaluations completed due to patient's decreased functional mobility was admitted for a comprehensive rehab program. Patient transferred to CIR on 12/19/2023 .    Patient currently requires max with basic self-care skills secondary to muscle weakness, decreased cardiorespiratoy endurance, decreased coordination and decreased motor planning, decreased initiation, decreased awareness, decreased problem solving, and decreased safety awareness, and decreased standing balance and difficulty maintaining precautions.  Prior to hospitalization, patient could complete self-care with mod I..  Patient will benefit from skilled intervention to decrease level of assist with basic self-care skills and increase independence with basic self-care skills prior to discharge home with care partner.  Anticipate patient will require 24 hour supervision and minimal physical assistance and follow up home health.  OT - End of Session Activity Tolerance: Tolerates < 10 min activity, no significant change in vital signs Endurance Deficit: Yes Endurance Deficit Description: constant rest breaks needed OT Assessment Rehab Potential (ACUTE ONLY): Good OT Barriers to Discharge: Inaccessible home environment;Home environment access/layout;Weight bearing restrictions OT Patient demonstrates impairments in the following area(s): Balance;Cognition;Edema;Endurance;Motor;Pain;Safety OT Basic ADL's Functional Problem(s): Bathing;Dressing;Toileting OT Transfers Functional Problem(s): Toilet;Tub/Shower OT Additional Impairment(s): None OT Plan OT Intensity: Minimum of 1-2 x/day, 45 to 90 minutes OT Frequency: 5 out of 7 days OT Duration/Estimated Length of Stay: 2 weeks OT Treatment/Interventions: Balance/vestibular training;Disease mangement/prevention;Neuromuscular re-education;Self Care/advanced ADL retraining;Therapeutic Exercise;Wheelchair propulsion/positioning;Cognitive remediation/compensation;DME/adaptive equipment  instruction;Pain management;Skin care/wound managment;UE/LE  Strength taining/ROM;Patient/family education;UE/LE Coordination activities;Discharge planning;Functional mobility training;Therapeutic Activities OT Self Feeding Anticipated Outcome(s): Set up A OT Basic Self-Care Anticipated Outcome(s): Supervision to min A OT Toileting Anticipated Outcome(s): Min A OT Bathroom Transfers Anticipated Outcome(s): Supervision OT Recommendation Patient destination: Home Follow Up Recommendations: Home health OT Equipment Recommended: To be determined   OT Evaluation Precautions/Restrictions  Restrictions Weight Bearing Restrictions Per Provider Order: Yes LLE Weight Bearing Per Provider Order: Non weight bearing Home Living/Prior Functioning Home Living Living Arrangements: Spouse/significant other Available Help at Discharge: Family, Available 24 hours/day Type of Home: House Home Access: Stairs to enter Secretary/administrator of Steps: 1 Entrance Stairs-Rails: None Home Layout: Laundry or work area in basement, One level [employer] Shower/Tub: Health visitor: Handicapped height Bathroom Accessibility: Yes Additional Comments: Has RW and SPC  Lives With: Spouse IADL History Homemaking Responsibilities: No Prior Function Level of Independence: Requires assistive device for independence, Independent with basic ADLs  Able to Take Stairs?: Yes Vision Baseline Vision/History: 1 Wears glasses Ability to See in Adequate Light: 1 Impaired Patient Visual Report: No change from baseline Vision Assessment?: No apparent visual deficits Perception  Perception: Within Functional Limits Praxis Praxis: Impaired Praxis Impairment Details: Motor planning;Organization;Initiation Cognition Cognition Overall Cognitive Status: Difficult to assess Arousal/Alertness: Awake/alert Orientation Level: Person;Place;Situation Person: Oriented Place: Oriented Situation: Oriented Memory:  Appears intact Awareness: Impaired Problem Solving: Impaired Safety/Judgment: Impaired Brief Interview for Mental Status (BIMS) Repetition of Three Words (First Attempt): No answer (refused to complete) Temporal Orientation: Year: No answer Temporal Orientation: Month: No answer Temporal Orientation: Day: No answer Recall: "Sock": No answer Recall: "Blue": No answer Recall: "Bed": No answer BIMS Summary Score: 99 Sensation Sensation Light Touch: Impaired Detail Light Touch Impaired Details: Impaired LLE;Impaired RLE Hot/Cold: Not tested Proprioception: Impaired by gross assessment Stereognosis: Not tested Additional Comments: cellulitis in RLE. Absent sensation in R great toe and underneath ace wrap on LLE. Pt reports chronic edema in RLE. Coordination Gross Motor Movements are Fluid and Coordinated: No Fine Motor Movements are Fluid and Coordinated: Yes Finger Nose Finger Test: amputation on 2nd digit on LUE Heel Shin Test: decreased ROM LLE>RLE Motor  Motor Motor: Abnormal postural alignment and control Motor - Skilled Clinical Observations: altered balance strategies due to LLE NWB precautions  Trunk/Postural Assessment  Cervical Assessment Cervical Assessment: Exceptions to [facility] (forward head) Thoracic Assessment Thoracic Assessment: Exceptions to [facility] (thoracic rounding) Lumbar Assessment Lumbar Assessment: Exceptions to [facility] (posterior pelvic tilt) Postural Control Postural Control: Deficits on evaluation Righting Reactions: delayed Protective Responses: delayed  Balance Balance Balance Assessed: Yes Static Sitting Balance Static Sitting - Balance Support: Feet supported;Bilateral upper extremity supported Static Sitting - Level of Assistance: 5: Stand by assistance (supervision) Dynamic Sitting Balance Dynamic Sitting - Balance Support: Feet supported;No upper extremity supported Dynamic Sitting - Level of Assistance: 5: Stand by assistance (supervision) Static  Standing Balance Static Standing - Balance Support: Bilateral upper extremity supported (stedy) Static Standing - Level of Assistance: 1: +2 Total assist Extremity/Trunk Assessment RUE Assessment RUE Assessment: Within Functional Limits LUE Assessment LUE Assessment: Within Functional Limits  Care Tool Care Tool Self Care Eating   Eating Assist Level: Set up assist    Oral Care    Oral Care Assist Level: Set up assist    Bathing   Body parts bathed by patient: Right arm;Left arm;Chest;Abdomen;Front perineal area;Face;Left upper leg;Right upper leg Body parts bathed by helper: Buttocks;Left lower leg;Right lower leg   Assist Level: Moderate Assistance - Patient 50 - 74%  Upper Body Dressing(including orthotics)   What is the patient wearing?: Pull over shirt   Assist Level: Supervision/Verbal cueing    Lower Body Dressing (excluding footwear)   What is the patient wearing?: Incontinence brief;Pants Assist for lower body dressing: Maximal Assistance - Patient 25 - 49%    Putting on/Taking off footwear   What is the patient wearing?: Shoes;Socks Assist for footwear: Dependent - Patient 0%       Care Tool Toileting Toileting activity   Assist for toileting: Total Assistance - Patient < 25%     Care Tool Bed Mobility Roll left and right activity        Sit to lying activity        Lying to sitting on side of bed activity         Care Tool Transfers Sit to stand transfer   Sit to stand assist level: 2 Helpers    Chair/bed transfer         Toilet transfer   Assist Level: 2 Helpers     Care Tool Cognition  Expression of Ideas and Wants Expression of Ideas and Wants: 4. Without difficulty (complex and basic) - expresses complex messages without difficulty and with speech that is clear and easy to understand  Understanding Verbal and Non-Verbal Content Understanding Verbal and Non-Verbal Content: 3. Usually understands - understands most conversations, but  misses some part/intent of message. Requires cues at times to understand   Memory/Recall Ability Memory/Recall Ability : Current season;That he or she is in a hospital/hospital unit   Refer to Care Plan for Long Term Goals  SHORT TERM GOAL WEEK 1 OT Short Term Goal 1 (Week 1): Pt will complete toilet transfer with min A using LRAD OT Short Term Goal 2 (Week 1): Pt will complete 1/3 toileting steps with CGA for balance OT Short Term Goal 3 (Week 1): Pt will complete sit > stand in prep for ADL with max A of 1 while maintaining NWB precautions on LLE OT Short Term Goal 4 (Week 1): Pt will thread LB clothing with supervision using AE PRN  Recommendations for other services: None    Skilled Therapeutic Intervention Patient received upright in bed with MD and wife present upon therapy arrival and agreeable to participate in OT evaluation. Education provided on OT purpose, therapy schedule, goals for therapy, and safety policy while in rehab. Unrated pain reported in LLE; nurse notified of pain med request however did not issue during session. OT offered rest breaks, repositioning, elevation for pain reduction.  Patient demonstrates dynamic sitting/standing balance, functional endurance, GM coordination, cognitive (awareness, problem solving, sequencing, safety) deficits resulting in difficulty completing BADL tasks without increased physical assist. Cues needed throughout for sequencing, safety, maintaining NWB adherence on LLE as pt with inability to do so with mobility.   Pt will benefit from skilled OT services to focus on mentioned deficits. See below for ADL and functional transfer performance. ADLs completed at bed level, EOB and seated in w/c. Required min/mod A +2 for slideboard transfer from EOB > w/c. Did stand in stedy with mod/max A +2 however pt unable to keep weight off LLE therefore discontinued and transferred pt back to EOB as pt with urgent need for BM and no bariatric DABSC present  in room therefore returned to bed for bed pan use with max A needed for lowering of pants and pan placement. Pt remained semi upright in bed on bed pan at conclusion of session with bed alarm on and  all needs met at end of session.    ADL ADL Eating: Set up Where Assessed-Eating: Wheelchair Grooming: Setup Where Assessed-Grooming: Wheelchair Upper Body Bathing: Supervision/safety Where Assessed-Upper Body Bathing: Edge of bed Lower Body Bathing: Moderate assistance Where Assessed-Lower Body Bathing: Bed level Upper Body Dressing: Supervision/safety Where Assessed-Upper Body Dressing: Edge of bed Lower Body Dressing: Maximal assistance Where Assessed-Lower Body Dressing: Bed level Toileting: Maximal assistance Where Assessed-Toileting: Bed level Toilet Transfer: Other (comment) (+2) Toilet Transfer Method: Scientist, research (life sciences): Other (comment) (simulated to w/c) Tub/Shower Transfer: Unable to assess Tub/Shower Transfer Method: Unable to assess Film/video editor: Unable to assess Film/video editor Method: Unable to assess Mobility  Bed Mobility Bed Mobility: Rolling Right;Rolling Left;Left Sidelying to Sit Rolling Right: Supervision/verbal cueing Rolling Left: Supervision/Verbal cueing Left Sidelying to Sit: Minimal Assistance - Patient >75% Transfers Sit to Stand: 2 Helpers   Discharge Criteria: Patient will be discharged from OT if patient refuses treatment 3 consecutive times without medical reason, if treatment goals not met, if there is a change in medical status, if patient makes no progress towards goals or if patient is discharged from hospital.  The above assessment, treatment plan, treatment alternatives and goals were discussed and mutually agreed upon: by patient and by family  Melvyn Novas, MS, OTR/L  12/20/2023, 12:22 PM

## 2023-12-20 NOTE — Progress Notes (Signed)
 Inpatient Rehabilitation  Patient information reviewed and entered into eRehab system by [facility]. Karen Kays., CCC/SLP, PPS Coordinator.  Information including medical coding, functional ability and quality indicators will be reviewed and updated through discharge.

## 2023-12-20 NOTE — Evaluation (Signed)
 Physical Therapy Assessment and Plan  Patient Details  Name: Julian Fowler MRN: [id number] Date of Birth: 1935/06/12  PT Diagnosis: Abnormal posture, Abnormality of gait, Coordination disorder, Difficulty walking, Edema, Impaired sensation, Muscle weakness, and Pain in knee Rehab Potential: Good ELOS: 2 weeks   Today's Date: 12/20/2023 PT Individual Time: 1100-1203 PT Individual Time Calculation (min): 63 min    Hospital Problem: Principal Problem:   Debility   Past Medical History:  Past Medical History:  Diagnosis Date   Allergic rhinitis    Aortic stenosis    Arthritis    Atrial fibrillation (HCC)    BPH (benign prostatic hyperplasia)    Coronary artery calcification seen on CT scan    Easy bruisability    ED (erectile dysfunction)    Essential hypertension    GERD (gastroesophageal reflux disease)    H/O hiatal hernia    Hearing loss in left ear    History of asbestosis    History of shingles    Hyperlipidemia    Overactive bladder    PSVT (paroxysmal supraventricular tachycardia) (HCC)    Sleep apnea    No longer on CPAP following weight loss   Tinnitus    Past Surgical History:  Past Surgical History:  Procedure Laterality Date   BACK SURGERY  2014   herniated L1, L2   Dr Channing Mutters   BIOPSY  09/18/2023   Procedure: BIOPSY;  Surgeon: Corbin Ade, MD;  Location: [facility];  Service: Endoscopy;;   BRAIN SURGERY     CEREBRAL EMBOLIZATION  12/2011   "radiation therapy-did not work"   CHOLECYSTECTOMY  6/98   COLONOSCOPY  09/2009   Dr. Cleotis Nipper: normal, internal hemorrhoids    COLONOSCOPY N/A 02/28/2017   Dr. Jena Gauss: Hemorrhoids, grade 3, mild diverticulosis.   CRANIECTOMY FOR EXCISION OF ACOUSTIC NEUROMA  3/95   ESOPHAGOGASTRODUODENOSCOPY (EGD) WITH PROPOFOL N/A 09/18/2023   Procedure: ESOPHAGOGASTRODUODENOSCOPY (EGD) WITH PROPOFOL;  Surgeon: Corbin Ade, MD;  Location: [facility];  Service: Endoscopy;  Laterality: N/A;  930am, asa 3   MINOR AMPUTATION  OF DIGIT Left 12/31/2018   Procedure: REVISION AMPUTATION OF LEFT INDEX FINGER, IRRIGATION AND DEBRIDEMENT LEFT INDEX FINGER;  Surgeon: Ernest Mallick, MD;  Location: [facility];  Service: Orthopedics;  Laterality: Left;   POLYPECTOMY  09/18/2023   Procedure: POLYPECTOMY;  Surgeon: Corbin Ade, MD;  Location: [facility];  Service: Endoscopy;;   RADIOLOGY WITH ANESTHESIA N/A 07/07/2014   Procedure: EMBOLIZATION;  Surgeon: Oneal Grout, MD;  Location: [facility];  Service: Radiology;  Laterality: N/A;   TOTAL KNEE ARTHROPLASTY Left 07/06/2013   Procedure: LEFT TOTAL KNEE ARTHROPLASTY;  Surgeon: Loanne Drilling, MD;  Location: [facility];  Service: Orthopedics;  Laterality: Left;   TOTAL KNEE ARTHROPLASTY Right 01/18/2014   Procedure: RIGHT TOTAL KNEE ARTHROPLASTY;  Surgeon: Loanne Drilling, MD;  Location: [facility];  Service: Orthopedics;  Laterality: Right;   TRIGGER FINGER RELEASE  2003   (thumb) middle finger (2006)    Assessment & Plan Clinical Impression: Patient is a 89 y.o. year old male with history significant for chronic hyponatremia, asbestosis, hypertension, hyperlipidemia, GERD, PSVT/atrial fibrillation not on anticoagulation due to history of falls, cerebral vascular AV fistula, acoustic neuroma status post craniectomy for excision 1995, BPH, asthma, anxiety, ILD, OSA, bilateral total knee replacement, quit smoking 37 years ago. Per chart review patient lives with spouse. 1 level home with one-step to entry. He does have a laundry and work area  in the basement. Ambulatory with a straight point cane. Patient initially presented to outside hospital [facility] 2/19 with severely elevated blood pressure complaints of fogginess, dizziness and lightheadedness. He was started on treatment for hypertensive emergency including nitroglycerin infusion. He was weaned off this to p.o. medications. Course complicated by some agitation requiring Haldol. Thought possibly secondary to sundowning had a  negative MRI. Urinalysis spacious for UTI as possibly contributing factors started on ceftriaxone. Developed significant chest pain 2/25 EKG showed evidence of ST depression diffusely and troponin trend was 1800, at 2200. He was transferred to [facility] for non-STEMI evaluation and cardiology services consulted. TTE with LVEF of 50 to 55% and no RWMA. Cardiology services Dr Remi Deter Mcdowell/Dr Herbie Baltimore recommended medical therapy Plavix monotherapy followed by low-dose aspirin after possibly 3 months. He has had no further bouts of chest pain or shortness of breath. Patient's mental status continued to improve suspect acute metabolic encephalopathy superimposed on underlying question mild dementia anxiety and UTI with completion of IV Rocephin. He continued to remain on Seroquel. Orthopedic services consulted 12/14/2023 after reported fall when patient's foot got caught while working with therapy and lost his balance with assisted fall to the floor denying striking his head or loss of consciousness. Patient's x-rays revealed closed displaced left distal fibula fracture as well as CT of left ankle 12/15/2023 did show a small trimalleolar fracture in the distal fibular component mildly displaced and no surgical intervention with follow-up or orthopedic services Dr. Samson Frederic and nonweightbearing to left lower extremity. Patient was transition from a cam boot to a well-padded plaster posterior short leg splint with a stirrup. CT of the left ankle was reviewed by orthopedic services and felt the fracture was in acceptable alignment in the splint and given his recent MI it was safest to manage the fracture conservatively in a splint followed by a cast. He will continue to be nonweightbearing on the left. He was weightbearing as tolerated on the right lower extremity. No current plan for surgical intervention. Patient with bouts of hyponatremia 127-131 and sodium chloride tablets as directed. Therapy evaluations  completed due to patient's decreased functional mobility was admitted for a comprehensive rehab program.   Patient currently requires total A +2 with mobility secondary to muscle weakness, decreased cardiorespiratoy endurance, decreased awareness, decreased problem solving, decreased safety awareness, and decreased memory, and decreased standing balance, decreased postural control, decreased balance strategies, and difficulty maintaining precautions.  Prior to hospitalization, patient was modified independent  with mobility and lived with Spouse in a House home.  Home access is 1Stairs to enter.  Patient will benefit from skilled PT intervention to maximize safe functional mobility, minimize fall risk, and decrease caregiver burden for planned discharge home with 24 hour supervision.  Anticipate patient will benefit from follow up HH at discharge.  PT - End of Session Activity Tolerance: Tolerates 30+ min activity with multiple rests Endurance Deficit: Yes Endurance Deficit Description: constant rest breaks needed PT Assessment Rehab Potential (ACUTE/IP ONLY): Good PT Barriers to Discharge: Inaccessible home environment;Home environment access/layout;Wound Care;Weight bearing restrictions;Other (comments) PT Barriers to Discharge Comments: nausea, NWB LLE, pain, 1 STE PT Patient demonstrates impairments in the following area(s): Balance;Behavior;Edema;Endurance;Motor;Nutrition;Pain;Sensory;Skin Integrity PT Transfers Functional Problem(s): Bed Mobility;Bed to Chair;Car;Furniture PT Locomotion Functional Problem(s): Ambulation;Wheelchair Mobility;Stairs PT Plan PT Intensity: Minimum of 1-2 x/day ,45 to 90 minutes PT Frequency: 5 out of 7 days PT Duration Estimated Length of Stay: 2 weeks PT Treatment/Interventions: Ambulation/gait training;Discharge planning;Functional mobility training;Psychosocial support;Therapeutic Activities;Visual/perceptual remediation/compensation;Balance/vestibular  training;Disease management/prevention;Neuromuscular re-education;Skin care/wound management;Therapeutic Exercise;Wheelchair propulsion/positioning;Cognitive remediation/compensation;DME/adaptive equipment instruction;Pain management;Splinting/orthotics;UE/LE Strength taining/ROM;Community reintegration;Functional electrical stimulation;Patient/family education;Stair training;UE/LE Coordination activities PT Transfers Anticipated Outcome(s): supervision with LRAD PT Locomotion Anticipated Outcome(s): CGA with LRAD PT Recommendation Follow Up Recommendations: Home health PT Patient destination: Home Equipment Recommended: To be determined Equipment Details: has SPC and RW   PT Evaluation Precautions/Restrictions Precautions Precautions: Fall Required Braces or Orthoses: Other Brace Other Brace: L posterior LE splint with stirup Restrictions Weight Bearing Restrictions Per Provider Order: Yes LLE Weight Bearing Per Provider Order: Non weight bearing Pain Interference Pain Interference Pain Effect on Sleep: 2. Occasionally Pain Interference with Therapy Activities: 1. Rarely or not at all Pain Interference with Day-to-Day Activities: 1. Rarely or not at all Home Living/Prior Functioning Home Living Living Arrangements: Spouse/significant other Available Help at Discharge: Family;Available 24 hours/day Type of Home: House Home Access: Stairs to enter [employer] of Steps: 1 Entrance Stairs-Rails: None Home Layout: Laundry or work area in basement;One level Bathroom Shower/Tub: Health visitor: Handicapped height Bathroom Accessibility: Yes Additional Comments: Has RW and SPC  Lives With: Spouse Prior Function Level of Independence: Requires assistive device for independence;Independent with basic ADLs  Able to Take Stairs?: Yes Driving: Yes Vision/Perception  Vision - History Ability to See in Adequate Light: 1 Impaired Perception Perception: Within  Functional Limits Praxis Praxis: Impaired Praxis Impairment Details: Motor planning;Organization;Initiation  Cognition Overall Cognitive Status: Difficult to assess Arousal/Alertness: Awake/alert Orientation Level: Oriented X4 Memory: Appears intact Awareness: Impaired Problem Solving: Impaired Safety/Judgment: Impaired Sensation Sensation Light Touch: Impaired Detail Light Touch Impaired Details: Impaired LLE;Impaired RLE Hot/Cold: Not tested Proprioception: Impaired by gross assessment Stereognosis: Not tested Additional Comments: cellulitis in RLE. Absent sensation in R great toe and underneath ace wrap on LLE. Pt reports chronic edema in RLE. Coordination Gross Motor Movements are Fluid and Coordinated: No Fine Motor Movements are Fluid and Coordinated: Yes Coordination and Movement Description: grossly uncoordinated due to altered balance strategies due to LLE NWB precautions Finger Nose Finger Test: amputation on 2nd digit on LUE Heel Shin Test: decreased ROM LLE>RLE Motor  Motor Motor: Abnormal postural alignment and control Motor - Skilled Clinical Observations: altered balance strategies due to LLE NWB precautions  Trunk/Postural Assessment  Cervical Assessment Cervical Assessment: Exceptions to [facility] (forward head) Thoracic Assessment Thoracic Assessment: Exceptions to [facility] (thoracic rounding) Lumbar Assessment Lumbar Assessment: Exceptions to [facility] (posterior pelvic tilt) Postural Control Postural Control: Deficits on evaluation Righting Reactions: delayed Protective Responses: delayed  Balance Balance Balance Assessed: Yes Static Sitting Balance Static Sitting - Balance Support: Feet supported;Bilateral upper extremity supported Static Sitting - Level of Assistance: 5: Stand by assistance (supervision) Dynamic Sitting Balance Dynamic Sitting - Balance Support: Feet supported;No upper extremity supported Dynamic Sitting - Level of Assistance: 5: Stand by  assistance (supervision) Static Standing Balance Static Standing - Balance Support: Bilateral upper extremity supported (stedy) Static Standing - Level of Assistance: 1: +2 Total assist Extremity Assessment  RLE Assessment RLE Assessment: Exceptions to [facility] General Strength Comments: grossly 4-/5 LLE Assessment LLE Assessment: Exceptions to [facility] General Strength Comments: grossly 3+/5  Care Tool Care Tool Bed Mobility Roll left and right activity        Sit to lying activity        Lying to sitting on side of bed activity         Care Tool Transfers Sit to stand transfer Sit to stand activity did not occur: Refused Sit to stand assist level: 2 Helpers  Chair/bed transfer Chair/bed transfer activity did not occur: Nurse, mental health transfer activity did not occur: Refused        Care Tool Locomotion Ambulation Ambulation activity did not occur: Refused        Walk 10 feet activity Walk 10 feet activity did not occur: Refused       Walk 50 feet with 2 turns activity Walk 50 feet with 2 turns activity did not occur: Refused      Walk 150 feet activity Walk 150 feet activity did not occur: Refused      Walk 10 feet on uneven surfaces activity Walk 10 feet on uneven surfaces activity did not occur: Refused      Stairs Stair activity did not occur: Refused        Walk up/down 1 step activity Walk up/down 1 step or curb (drop down) activity did not occur: Refused      Walk up/down 4 steps activity Walk up/down 4 steps activity did not occur: Refused      Walk up/down 12 steps activity Walk up/down 12 steps activity did not occur: Refused      Pick up small objects from floor Pick up small object from the floor (from standing position) activity did not occur: Refused      Wheelchair Is the patient using a wheelchair?: Yes Type of Wheelchair: Manual Wheelchair activity did not occur: Refused      Wheel 50 feet with 2 turns activity  Wheelchair 50 feet with 2 turns activity did not occur: Refused    Wheel 150 feet activity Wheelchair 150 feet activity did not occur: Refused      Refer to Care Plan for Long Term Goals  SHORT TERM GOAL WEEK 1 PT Short Term Goal 1 (Week 1): pt will perform bed mobility with supervision PT Short Term Goal 2 (Week 1): pt will transfer bed<>chair with LRAD and min A PT Short Term Goal 3 (Week 1): pt will transfer sit<>stand with LRAD and mod A  Recommendations for other services: None   Skilled Therapeutic Intervention Evaluation completed (see details above and below) with education on PT POC and goals and individual treatment initiated with focus on functional mobility and generalized strengthening and endurance. Received pt semi-reclined in bed with wife at bedside. Pt educated on PT evaluation, CIR policies, and therapy schedule and agreeable. Pt reported pain in L knee (premedicated). Pt upset regarding current situation and therapist "tripping" him resulting in fall. Began subjective history intake, then pt reported feeling nauseous and began dry heaving but no active emesis episode (most likely due to taking meds on empty stomach) - provided pt with crackers and cool washcloth. Encouraged OOB mobility, however pt declining getting OOB or sitting EOB due to nausea but agreeable to bed level exercises. Pt performed the following exercises with emphasis on LE strength/ROM: -SLR 2x12 bilaterally -hip abduction 2x12 bilaterally -hip adduction pillow squeezes 2x10 with 10 second isometric hold -SAQ 2x20 on LLE -hip abduction with grn TB 2x15 Significantly increased time spent building rapport and discussing pt's goals for rehab stay. Concluded session with pt semi-reclined in bed, needs within reach, and bed alarm on.   Mobility Bed Mobility Bed Mobility: Rolling Right;Rolling Left;Left Sidelying to Sit Rolling Right: Supervision/verbal cueing Rolling Left: Supervision/Verbal cueing Left  Sidelying to Sit: Minimal Assistance - Patient >75% Transfers Transfers: Sit to Stand Sit to Stand: 2 Helpers Transfer (Assistive device): Other (Comment) (stedy) Locomotion  Gait Ambulation:  No Gait Gait: No Stairs / Additional Locomotion Stairs: No Wheelchair Mobility Wheelchair Mobility: No   Discharge Criteria: Patient will be discharged from PT if patient refuses treatment 3 consecutive times without medical reason, if treatment goals not met, if there is a change in medical status, if patient makes no progress towards goals or if patient is discharged from hospital.  The above assessment, treatment plan, treatment alternatives and goals were discussed and mutually agreed upon: by patient and by family  Huntley Dec PT, DPT 12/20/2023, 12:17 PM

## 2023-12-20 NOTE — Progress Notes (Signed)
 PROGRESS NOTE   Subjective/Complaints: Complains of insomnia- says he has difficulty staying asleep at night- felt that interruptions at night were a contributor Na 128 still  ROS: +insomnia   Objective:   No results found. Recent Labs    12/18/23 0443 12/20/23 0638  WBC 11.5* 9.9  HGB 11.6* 11.5*  HCT 33.4* 33.7*  PLT 382 407*   Recent Labs    12/20/23 0638 12/20/23 0639  NA 128* 128*  K 4.6 4.5  CL 97* 96*  CO2 24 21*  GLUCOSE 99 98  BUN 15 15  CREATININE 0.95 0.91  CALCIUM 8.3* 8.3*    Intake/Output Summary (Last 24 hours) at 12/20/2023 1035 Last data filed at 12/20/2023 0803 Gross per 24 hour  Intake 718 ml  Output 600 ml  Net 118 ml        Physical Exam: Vital Signs Blood pressure (!) 133/97, pulse 91, temperature 98.2 F (36.8 C), temperature source Oral, resp. rate 18, SpO2 96%. Gen: no distress, normal appearing HEENT: oral mucosa pink and moist, NCAT Cardio: Reg rate Chest: normal effort, normal rate of breathing Abd: soft, non-distended Ext: no edema Psych: pleasant, normal affect Left lower extremity splint clean/dry/intact   MSK:     + LLE splint.  Full active range of motion all other extremities.       Neurologic exam:  Cognition: AAO to person, place, time and event.  + Endorsing hallucinations, seeing the wall move at nighttime Language: Fluent, No substitutions or neoglisms. No dysarthria. Names 3/3 objects correctly.  Memory: Recalls 1/3 objects at 5 minutes. + Deficits, chronic per wife, also new   Insight: Fair insight into current condition.  Mood: Pleasant affect, appropriate mood.  Sensation: To light touch intact absent in left toes. Reflexes: 2+ in BL UE and LEs. Negative Hoffman's laterally. CN: 2-12 grossly intact.  Coordination: No apparent tremors. No ataxia on FTN      Strength:                RUE: 5/5 SA, 5/5 EF, 5/5 EE, 5/5 WE, 5/5 FF, 5/5 FA                 LUE:  5/5 SA, 5/5 EF, 5/5 EE, 5/5 WE, 5/5 FF, 5/5 FA                RLE: 4/5 HF, 5/5 KE, 5/5  DF, 5/5  EHL, 5/5  PF                 LLE:  5/5 HF, 5/5 KE, 1/5  DF, 1/5  EHL, 1/5  PF   Assessment/Plan: 1. Functional deficits which require 3+ hours per day of interdisciplinary therapy in a comprehensive inpatient rehab setting. Physiatrist is providing close team supervision and 24 hour management of active medical problems listed below. Physiatrist and rehab team continue to assess barriers to discharge/monitor patient progress toward functional and medical goals  Care Tool:  Bathing    Body parts bathed by patient: Right arm, Left arm, Chest, Abdomen, Front perineal area, Face, Left upper leg, Right upper leg   Body parts bathed by helper: Buttocks, Left lower leg, Right  lower leg     Bathing assist Assist Level: Moderate Assistance - Patient 50 - 74%     Upper Body Dressing/Undressing Upper body dressing   What is the patient wearing?: Pull over shirt    Upper body assist Assist Level: Supervision/Verbal cueing    Lower Body Dressing/Undressing Lower body dressing      What is the patient wearing?: Incontinence brief, Pants     Lower body assist Assist for lower body dressing: Maximal Assistance - Patient 25 - 49%     Toileting Toileting    Toileting assist Assist for toileting: Total Assistance - Patient < 25%     Transfers Chair/bed transfer  Transfers assist           Locomotion Ambulation   Ambulation assist              Walk 10 feet activity   Assist           Walk 50 feet activity   Assist           Walk 150 feet activity   Assist           Walk 10 feet on uneven surface  activity   Assist           Wheelchair     Assist               Wheelchair 50 feet with 2 turns activity    Assist            Wheelchair 150 feet activity     Assist          Blood pressure (!) 133/97, pulse  91, temperature 98.2 F (36.8 C), temperature source Oral, resp. rate 18, SpO2 96%.  Medical Problem List and Plan: 1. Functional deficits secondary to debility after non-STEMI with conservative care complicated by left fibular fracture after fall.NWB.  Follow-up outpatient orthopedic services DR Victorino Dike to consider need for surgical intervention             -patient may shower with cast covered              -ELOS/Goals: 18 to 21 days, supervision level PT/OT/SLP             -D3 supplement started  -grounds pass ordered 2.  Antithrombotics: -DVT/anticoagulation.  Subcutaneous heparin initiated 12/18/2023             -antiplatelet therapy: Plavix 75 mg daily 3. Pain Management: Voltaren gel 4 times daily, tramadol as needed 4. Anxiety: conitnue Ativan 0.5 mg 3 times daily as needed.  Provide emotional support             -antipsychotic agents: Seroquel 50 mg nightly             -delirium precautions 5. Neuropsych/cognition: This patient is not capable of making decisions on his own behalf. 6. Skin/Wound Care: Routine skin checks 7. Fluids/Electrolytes/Nutrition: Routine in and outs with follow-up chemistries 8.  Left oblique fibular fracture.  Nonweightbearing.  Transition from cam boot to well-padded plastic posterior short leg splint with stirrup.  No current plan for surgical intervention. Zinc supplement started  9.  Hypertension.  Norvasc 10 mg daily, hydralazine 100 mg 3 times daily, Avapro 150 mg daily, Imdur 60 mg daily, Aldactone 25 mg daily.  Monitor with increased mobility. Magnesium gluconate 250mg  ordered HS  10.  OSA/COPD, continue inhalers as directed.  Monitor oxygen saturations 11.  Chronic hyponatremia.  Sodium chloride tablets.  Follow-up chemistries  12.  Hyperlipidemia.  Pravachol 13.  GERD.  Protonix  14. 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 Insomnia: asked nursing to  place sign on door to minimize interruptions between 9pm and 5am    LOS: 1 days A FACE TO FACE EVALUATION WAS PERFORMED  Clint Bolder P Waylan Busta 12/20/2023, 10:35 AM

## 2023-12-20 NOTE — Discharge Instructions (Addendum)
 Inpatient Rehab Discharge Instructions  Julian Fowler Discharge date and time: No discharge date for patient encounter.   Activities/Precautions/ Functional Status: Activity: Nonweightbearing left lower extremity/maintain short leg splint Diet: regular diet Wound Care: Routine skin checks Functional status:  ___ No restrictions     ___ Walk up steps independently ___ 24/7 supervision/assistance   ___ Walk up steps with assistance ___ Intermittent supervision/assistance  ___ Bathe/dress independently ___ Walk with walker     _x__ Bathe/dress with assistance ___ Walk Independently    ___ Shower independently ___ Walk with assistance    ___ Shower with assistance ___ No alcohol     ___ Return to work/school ________  Special Instructions: No driving smoking or alcohol   My questions have been answered and I understand these instructions. I will adhere to these goals and the provided educational materials after my discharge from the hospital.  Patient/Caregiver Signature _______________________________ Date __________  Clinician Signature _______________________________________ Date __________  Please bring this form and your medication list with you to all your follow-up doctor's appointments.

## 2023-12-21 ENCOUNTER — Encounter: Payer: Self-pay | Admitting: Physician Assistant

## 2023-12-21 ENCOUNTER — Inpatient Hospital Stay (HOSPITAL_COMMUNITY)

## 2023-12-21 ENCOUNTER — Other Ambulatory Visit (HOSPITAL_COMMUNITY)

## 2023-12-21 ENCOUNTER — Encounter (HOSPITAL_COMMUNITY): Payer: Self-pay | Admitting: Emergency Medicine

## 2023-12-21 ENCOUNTER — Inpatient Hospital Stay (HOSPITAL_COMMUNITY)
Admission: EM | Admit: 2023-12-21 | Discharge: 2024-01-14 | DRG: 208 | Disposition: E | Source: Intra-hospital | Attending: Emergency Medicine | Admitting: Emergency Medicine

## 2023-12-21 ENCOUNTER — Encounter (HOSPITAL_COMMUNITY): Payer: Self-pay

## 2023-12-21 DIAGNOSIS — I469 Cardiac arrest, cause unspecified: Secondary | ICD-10-CM | POA: Diagnosis not present

## 2023-12-21 DIAGNOSIS — F419 Anxiety disorder, unspecified: Secondary | ICD-10-CM | POA: Diagnosis present

## 2023-12-21 DIAGNOSIS — Z885 Allergy status to narcotic agent status: Secondary | ICD-10-CM

## 2023-12-21 DIAGNOSIS — J929 Pleural plaque without asbestos: Secondary | ICD-10-CM | POA: Diagnosis not present

## 2023-12-21 DIAGNOSIS — J849 Interstitial pulmonary disease, unspecified: Secondary | ICD-10-CM | POA: Diagnosis not present

## 2023-12-21 DIAGNOSIS — R059 Cough, unspecified: Secondary | ICD-10-CM | POA: Diagnosis not present

## 2023-12-21 DIAGNOSIS — Z7951 Long term (current) use of inhaled steroids: Secondary | ICD-10-CM | POA: Diagnosis not present

## 2023-12-21 DIAGNOSIS — J690 Pneumonitis due to inhalation of food and vomit: Secondary | ICD-10-CM | POA: Diagnosis present

## 2023-12-21 DIAGNOSIS — G4700 Insomnia, unspecified: Secondary | ICD-10-CM | POA: Diagnosis not present

## 2023-12-21 DIAGNOSIS — Z7902 Long term (current) use of antithrombotics/antiplatelets: Secondary | ICD-10-CM

## 2023-12-21 DIAGNOSIS — E66812 Obesity, class 2: Secondary | ICD-10-CM | POA: Diagnosis present

## 2023-12-21 DIAGNOSIS — Z91040 Latex allergy status: Secondary | ICD-10-CM

## 2023-12-21 DIAGNOSIS — J4520 Mild intermittent asthma, uncomplicated: Secondary | ICD-10-CM | POA: Diagnosis present

## 2023-12-21 DIAGNOSIS — I2510 Atherosclerotic heart disease of native coronary artery without angina pectoris: Secondary | ICD-10-CM | POA: Diagnosis present

## 2023-12-21 DIAGNOSIS — I350 Nonrheumatic aortic (valve) stenosis: Secondary | ICD-10-CM | POA: Diagnosis present

## 2023-12-21 DIAGNOSIS — R410 Disorientation, unspecified: Secondary | ICD-10-CM | POA: Diagnosis present

## 2023-12-21 DIAGNOSIS — N400 Enlarged prostate without lower urinary tract symptoms: Secondary | ICD-10-CM | POA: Diagnosis present

## 2023-12-21 DIAGNOSIS — J4489 Other specified chronic obstructive pulmonary disease: Secondary | ICD-10-CM | POA: Diagnosis not present

## 2023-12-21 DIAGNOSIS — I10 Essential (primary) hypertension: Secondary | ICD-10-CM | POA: Diagnosis present

## 2023-12-21 DIAGNOSIS — J449 Chronic obstructive pulmonary disease, unspecified: Secondary | ICD-10-CM

## 2023-12-21 DIAGNOSIS — Z809 Family history of malignant neoplasm, unspecified: Secondary | ICD-10-CM

## 2023-12-21 DIAGNOSIS — Z882 Allergy status to sulfonamides status: Secondary | ICD-10-CM

## 2023-12-21 DIAGNOSIS — J61 Pneumoconiosis due to asbestos and other mineral fibers: Secondary | ICD-10-CM | POA: Diagnosis not present

## 2023-12-21 DIAGNOSIS — K219 Gastro-esophageal reflux disease without esophagitis: Secondary | ICD-10-CM | POA: Diagnosis not present

## 2023-12-21 DIAGNOSIS — J9601 Acute respiratory failure with hypoxia: Principal | ICD-10-CM | POA: Diagnosis present

## 2023-12-21 DIAGNOSIS — R0989 Other specified symptoms and signs involving the circulatory and respiratory systems: Secondary | ICD-10-CM | POA: Diagnosis not present

## 2023-12-21 DIAGNOSIS — Z8249 Family history of ischemic heart disease and other diseases of the circulatory system: Secondary | ICD-10-CM

## 2023-12-21 DIAGNOSIS — J9600 Acute respiratory failure, unspecified whether with hypoxia or hypercapnia: Secondary | ICD-10-CM | POA: Diagnosis present

## 2023-12-21 DIAGNOSIS — E871 Hypo-osmolality and hyponatremia: Secondary | ICD-10-CM | POA: Diagnosis not present

## 2023-12-21 DIAGNOSIS — H9192 Unspecified hearing loss, left ear: Secondary | ICD-10-CM | POA: Diagnosis present

## 2023-12-21 DIAGNOSIS — D333 Benign neoplasm of cranial nerves: Secondary | ICD-10-CM | POA: Diagnosis not present

## 2023-12-21 DIAGNOSIS — Z89022 Acquired absence of left finger(s): Secondary | ICD-10-CM

## 2023-12-21 DIAGNOSIS — I2699 Other pulmonary embolism without acute cor pulmonale: Secondary | ICD-10-CM | POA: Diagnosis present

## 2023-12-21 DIAGNOSIS — R0603 Acute respiratory distress: Secondary | ICD-10-CM | POA: Diagnosis not present

## 2023-12-21 DIAGNOSIS — Z96653 Presence of artificial knee joint, bilateral: Secondary | ICD-10-CM | POA: Diagnosis present

## 2023-12-21 DIAGNOSIS — I161 Hypertensive emergency: Secondary | ICD-10-CM | POA: Diagnosis present

## 2023-12-21 DIAGNOSIS — Z91048 Other nonmedicinal substance allergy status: Secondary | ICD-10-CM

## 2023-12-21 DIAGNOSIS — Z841 Family history of disorders of kidney and ureter: Secondary | ICD-10-CM

## 2023-12-21 DIAGNOSIS — S82832D Other fracture of upper and lower end of left fibula, subsequent encounter for closed fracture with routine healing: Secondary | ICD-10-CM

## 2023-12-21 DIAGNOSIS — Z860100 Personal history of colon polyps, unspecified: Secondary | ICD-10-CM

## 2023-12-21 DIAGNOSIS — I214 Non-ST elevation (NSTEMI) myocardial infarction: Secondary | ICD-10-CM | POA: Diagnosis not present

## 2023-12-21 DIAGNOSIS — S82402A Unspecified fracture of shaft of left fibula, initial encounter for closed fracture: Secondary | ICD-10-CM | POA: Diagnosis present

## 2023-12-21 DIAGNOSIS — R5381 Other malaise: Secondary | ICD-10-CM | POA: Diagnosis not present

## 2023-12-21 DIAGNOSIS — I252 Old myocardial infarction: Secondary | ICD-10-CM | POA: Diagnosis not present

## 2023-12-21 DIAGNOSIS — Z811 Family history of alcohol abuse and dependence: Secondary | ICD-10-CM

## 2023-12-21 DIAGNOSIS — W19XXXD Unspecified fall, subsequent encounter: Secondary | ICD-10-CM | POA: Diagnosis present

## 2023-12-21 DIAGNOSIS — E785 Hyperlipidemia, unspecified: Secondary | ICD-10-CM | POA: Diagnosis present

## 2023-12-21 DIAGNOSIS — R972 Elevated prostate specific antigen [PSA]: Secondary | ICD-10-CM | POA: Diagnosis present

## 2023-12-21 DIAGNOSIS — G4733 Obstructive sleep apnea (adult) (pediatric): Secondary | ICD-10-CM | POA: Diagnosis present

## 2023-12-21 DIAGNOSIS — I4821 Permanent atrial fibrillation: Secondary | ICD-10-CM | POA: Diagnosis present

## 2023-12-21 DIAGNOSIS — Z888 Allergy status to other drugs, medicaments and biological substances status: Secondary | ICD-10-CM

## 2023-12-21 DIAGNOSIS — Z79899 Other long term (current) drug therapy: Secondary | ICD-10-CM

## 2023-12-21 DIAGNOSIS — Z6835 Body mass index (BMI) 35.0-35.9, adult: Secondary | ICD-10-CM

## 2023-12-21 DIAGNOSIS — Z820 Family history of epilepsy and other diseases of the nervous system: Secondary | ICD-10-CM

## 2023-12-21 DIAGNOSIS — Z803 Family history of malignant neoplasm of breast: Secondary | ICD-10-CM

## 2023-12-21 DIAGNOSIS — Z87891 Personal history of nicotine dependence: Secondary | ICD-10-CM

## 2023-12-21 DIAGNOSIS — R061 Stridor: Secondary | ICD-10-CM | POA: Diagnosis not present

## 2023-12-21 LAB — D-DIMER, QUANTITATIVE: D-Dimer, Quant: 2.52 ug{FEU}/mL — ABNORMAL HIGH (ref 0.00–0.50)

## 2023-12-21 LAB — CBC
HCT: 36.4 % — ABNORMAL LOW (ref 39.0–52.0)
HCT: 35.6 % — ABNORMAL LOW (ref 39.0–52.0)
Hemoglobin: 12.1 g/dL — ABNORMAL LOW (ref 13.0–17.0)
Hemoglobin: 12.2 g/dL — ABNORMAL LOW (ref 13.0–17.0)
MCH: 29.6 pg (ref 26.0–34.0)
MCH: 30.5 pg (ref 26.0–34.0)
MCHC: 33.2 g/dL (ref 30.0–36.0)
MCHC: 34.3 g/dL (ref 30.0–36.0)
MCV: 89 fL (ref 80.0–100.0)
MCV: 89 fL (ref 80.0–100.0)
Platelets: 586 10*3/uL — ABNORMAL HIGH (ref 150–400)
Platelets: 517 10*3/uL — ABNORMAL HIGH (ref 150–400)
RBC: 4.09 MIL/uL — ABNORMAL LOW (ref 4.22–5.81)
RBC: 4 MIL/uL — ABNORMAL LOW (ref 4.22–5.81)
RDW: 13.8 % (ref 11.5–15.5)
RDW: 13.7 % (ref 11.5–15.5)
WBC: 15 10*3/uL — ABNORMAL HIGH (ref 4.0–10.5)
WBC: 14.6 10*3/uL — ABNORMAL HIGH (ref 4.0–10.5)
nRBC: 0 % (ref 0.0–0.2)
nRBC: 0 % (ref 0.0–0.2)

## 2023-12-21 LAB — RENAL FUNCTION PANEL
Albumin: 2.6 g/dL — ABNORMAL LOW (ref 3.5–5.0)
Anion gap: 10 (ref 5–15)
BUN: 21 mg/dL (ref 8–23)
CO2: 21 mmol/L — ABNORMAL LOW (ref 22–32)
Calcium: 8.2 mg/dL — ABNORMAL LOW (ref 8.9–10.3)
Chloride: 94 mmol/L — ABNORMAL LOW (ref 98–111)
Creatinine, Ser: 1.07 mg/dL (ref 0.61–1.24)
GFR, Estimated: >60 mL/min (ref >60)
Glucose, Bld: 101 mg/dL — ABNORMAL HIGH (ref 70–99)
Phosphorus: 4.4 mg/dL (ref 2.5–4.6)
Potassium: 4.3 mmol/L (ref 3.5–5.1)
Sodium: 125 mmol/L — ABNORMAL LOW (ref 135–145)

## 2023-12-21 LAB — BASIC METABOLIC PANEL
Anion gap: 14 (ref 5–15)
BUN: 26 mg/dL — ABNORMAL HIGH (ref 8–23)
CO2: 20 mmol/L — ABNORMAL LOW (ref 22–32)
Calcium: 9 mg/dL (ref 8.9–10.3)
Chloride: 88 mmol/L — ABNORMAL LOW (ref 98–111)
Creatinine, Ser: 1.29 mg/dL — ABNORMAL HIGH (ref 0.61–1.24)
GFR, Estimated: 54 mL/min — ABNORMAL LOW (ref >60)
Glucose, Bld: 126 mg/dL — ABNORMAL HIGH (ref 70–99)
Potassium: 4.7 mmol/L (ref 3.5–5.1)
Sodium: 122 mmol/L — ABNORMAL LOW (ref 135–145)

## 2023-12-21 LAB — URINALYSIS, ROUTINE W REFLEX MICROSCOPIC
Bilirubin Urine: NEGATIVE
Glucose, UA: NEGATIVE mg/dL
Hgb urine dipstick: NEGATIVE
Ketones, ur: NEGATIVE mg/dL
Leukocytes,Ua: NEGATIVE
Nitrite: NEGATIVE
Protein, ur: 30 mg/dL — AB
Specific Gravity, Urine: 1.02 (ref 1.005–1.030)
pH: 5 (ref 5.0–8.0)

## 2023-12-21 LAB — TROPONIN I (HIGH SENSITIVITY)
Troponin I (High Sensitivity): 1699 ng/L (ref <18)
Troponin I (High Sensitivity): 1678 ng/L (ref <18)
Troponin I (High Sensitivity): 2385 ng/L (ref <18)

## 2023-12-21 LAB — MRSA NEXT GEN BY PCR, NASAL: MRSA by PCR Next Gen: NOT DETECTED

## 2023-12-21 MED ORDER — IPRATROPIUM-ALBUTEROL 0.5-2.5 (3) MG/3ML IN SOLN
RESPIRATORY_TRACT | Status: AC
  Administered 2023-12-21: 3 mL via RESPIRATORY_TRACT
  Filled 2023-12-21: qty 3

## 2023-12-21 MED ORDER — TRAMADOL HCL 50 MG PO TABS: 50.0000 mg | ORAL_TABLET | Freq: Four times a day (QID) | ORAL | Status: DC | PRN

## 2023-12-21 MED ORDER — AMLODIPINE BESYLATE 10 MG PO TABS: 10.0000 mg | ORAL_TABLET | Freq: Every day | ORAL | Status: DC

## 2023-12-21 MED ORDER — IPRATROPIUM-ALBUTEROL 0.5-2.5 (3) MG/3ML IN SOLN: 3.0000 mL | RESPIRATORY_TRACT | Status: DC | PRN

## 2023-12-21 MED ORDER — IPRATROPIUM-ALBUTEROL 0.5-2.5 (3) MG/3ML IN SOLN: 3.0000 mL | Freq: Four times a day (QID) | RESPIRATORY_TRACT | Status: DC | PRN

## 2023-12-21 MED ORDER — METHYLPREDNISOLONE SODIUM SUCC 125 MG IJ SOLR
125.0000 mg | Freq: Once | INTRAMUSCULAR | Status: AC
  Administered 2023-12-21: 125 mg via INTRAVENOUS
  Filled 2023-12-21: qty 2

## 2023-12-21 MED ORDER — LATANOPROST 0.005 % OP SOLN
1.0000 [drp] | Freq: Every day | OPHTHALMIC | Status: DC
Start: 2023-12-21 — End: 2023-12-22
  Filled 2023-12-21: qty 2.5

## 2023-12-21 MED ORDER — IRBESARTAN 150 MG PO TABS: 150.0000 mg | ORAL_TABLET | Freq: Every day | ORAL | Status: DC

## 2023-12-21 MED ORDER — ARTIFICIAL TEARS OPHTHALMIC OINT: TOPICAL_OINTMENT | Freq: Two times a day (BID) | OPHTHALMIC | Status: DC | PRN

## 2023-12-21 MED ORDER — HEPARIN (PORCINE) 25000 UT/250ML-% IV SOLN
1600.0000 [IU]/h | INTRAVENOUS | Status: DC
Start: 2023-12-21 — End: 2023-12-22

## 2023-12-21 MED ORDER — QUETIAPINE FUMARATE 50 MG PO TABS: 50.0000 mg | ORAL_TABLET | Freq: Every day | ORAL | Status: DC

## 2023-12-21 MED ORDER — CLOPIDOGREL BISULFATE 75 MG PO TABS: 75.0000 mg | ORAL_TABLET | Freq: Every day | ORAL | Status: DC

## 2023-12-21 MED ORDER — NITROFURANTOIN MACROCRYSTAL 50 MG PO CAPS: 50.0000 mg | ORAL_CAPSULE | Freq: Every day | ORAL | Status: DC

## 2023-12-21 MED ORDER — DICLOFENAC SODIUM 1 % EX GEL
2.0000 g | Freq: Four times a day (QID) | CUTANEOUS | Status: DC
  Filled 2023-12-21: qty 100

## 2023-12-21 MED ORDER — ASPIRIN 325 MG PO TABS: 325.0000 mg | ORAL_TABLET | ORAL | Status: AC

## 2023-12-21 MED ORDER — HEPARIN SODIUM (PORCINE) 5000 UNIT/ML IJ SOLN
5000.0000 [IU] | Freq: Three times a day (TID) | INTRAMUSCULAR | Status: DC
  Administered 2023-12-21: 5000 [IU] via SUBCUTANEOUS
  Filled 2023-12-21: qty 1

## 2023-12-21 MED ORDER — ADULT MULTIVITAMIN W/MINERALS CH
1.0000 | ORAL_TABLET | Freq: Every day | ORAL | Status: DC
  Administered 2023-12-21: 1 via ORAL
  Filled 2023-12-21: qty 1

## 2023-12-21 MED ORDER — POLYETHYLENE GLYCOL 3350 17 G PO PACK: 17.0000 g | PACK | Freq: Every day | ORAL | Status: DC

## 2023-12-21 MED ORDER — METHYLPREDNISOLONE SODIUM SUCC 40 MG IJ SOLR: 40.0000 mg | Freq: Once | INTRAMUSCULAR | Status: DC

## 2023-12-21 MED ORDER — HEPARIN (PORCINE) 25000 UT/250ML-% IV SOLN
INTRAVENOUS | Status: AC
  Administered 2023-12-21: 1600 [IU]/h via INTRAVENOUS
  Filled 2023-12-21: qty 250

## 2023-12-21 MED ORDER — FUROSEMIDE 10 MG/ML IJ SOLN
40.0000 mg | Freq: Once | INTRAMUSCULAR | Status: AC
  Administered 2023-12-21: 40 mg via INTRAVENOUS
  Filled 2023-12-21: qty 4

## 2023-12-21 MED ORDER — ISOSORBIDE MONONITRATE ER 60 MG PO TB24: 60.0000 mg | ORAL_TABLET | Freq: Every day | ORAL | Status: DC

## 2023-12-21 MED ORDER — ACETAMINOPHEN 325 MG PO TABS: 325.0000 mg | ORAL_TABLET | Freq: Two times a day (BID) | ORAL | Status: DC | PRN

## 2023-12-21 MED ORDER — PRAVASTATIN SODIUM 40 MG PO TABS
40.0000 mg | ORAL_TABLET | Freq: Every day | ORAL | Status: DC
  Filled 2023-12-21: qty 1

## 2023-12-21 MED ORDER — MEMANTINE HCL 5 MG PO TABS
5.0000 mg | ORAL_TABLET | Freq: Two times a day (BID) | ORAL | Status: DC
  Filled 2023-12-21: qty 1

## 2023-12-21 MED ORDER — HYDRALAZINE HCL 50 MG PO TABS
100.0000 mg | ORAL_TABLET | Freq: Three times a day (TID) | ORAL | Status: DC
  Filled 2023-12-21: qty 2

## 2023-12-21 MED ORDER — HEPARIN BOLUS VIA INFUSION
4000.0000 [IU] | Freq: Once | INTRAVENOUS | Status: AC
  Administered 2023-12-21: 4000 [IU] via INTRAVENOUS
  Filled 2023-12-21: qty 4000

## 2023-12-21 MED ORDER — IPRATROPIUM-ALBUTEROL 0.5-2.5 (3) MG/3ML IN SOLN
RESPIRATORY_TRACT | Status: AC
  Administered 2023-12-21: 3 mL
  Filled 2023-12-21: qty 3

## 2023-12-21 MED ORDER — BUDESONIDE 0.25 MG/2ML IN SUSP: 0.2500 mg | Freq: Two times a day (BID) | RESPIRATORY_TRACT | Status: DC

## 2023-12-21 MED ORDER — TENECTEPLASE 50 MG IV KIT
50.0000 mg | PACK | Freq: Once | INTRAVENOUS | Status: AC
Start: 2023-12-21 — End: 2023-12-21
  Administered 2023-12-21: 50 mg via INTRAVENOUS
  Filled 2023-12-21: qty 10

## 2023-12-21 MED ORDER — ONDANSETRON HCL 4 MG/2ML IJ SOLN
4.0000 mg | Freq: Four times a day (QID) | INTRAMUSCULAR | Status: DC | PRN
  Administered 2023-12-21: 4 mg via INTRAVENOUS

## 2023-12-21 MED ORDER — ASPIRIN 325 MG PO TABS
ORAL_TABLET | ORAL | Status: AC
  Administered 2023-12-21: 325 mg via ORAL
  Filled 2023-12-21: qty 1

## 2023-12-21 MED ORDER — SODIUM CHLORIDE 1 G PO TABS
1.0000 g | ORAL_TABLET | Freq: Three times a day (TID) | ORAL | Status: DC
  Filled 2023-12-21 (×2): qty 1

## 2023-12-21 MED ORDER — SPIRONOLACTONE 25 MG PO TABS: 25.0000 mg | ORAL_TABLET | Freq: Every day | ORAL | Status: DC

## 2023-12-21 MED ORDER — ARFORMOTEROL TARTRATE 15 MCG/2ML IN NEBU: 15.0000 ug | INHALATION_SOLUTION | Freq: Two times a day (BID) | RESPIRATORY_TRACT | Status: DC

## 2023-12-21 MED ORDER — ONDANSETRON HCL 4 MG/2ML IJ SOLN
INTRAMUSCULAR | Status: AC
  Filled 2023-12-21: qty 2

## 2023-12-21 MED ORDER — PANTOPRAZOLE SODIUM 40 MG IV SOLR
40.0000 mg | INTRAVENOUS | Status: DC
  Administered 2023-12-21: 40 mg via INTRAVENOUS
  Filled 2023-12-21: qty 10

## 2023-12-23 NOTE — Progress Notes (Signed)
 Physical Therapy Discharge Patient Details Name: Julian Fowler MRN: [id number] DOB: 11-02-1934 Today's Date: 12/23/2023  This patient was unable to complete the inpatient rehab program due to change in medical status; therefore did not meet their long term goals. Pt left the program at a max assist +2 level for their functional mobility/ transfers. This patient is being discharged from PT services at this time.  Pt's perception of pain in the last five days was unable to answer at this time.   See CareTool for functional status details  If the patient is able to return to inpatient rehabilitation within 3 midnights, this may be considered an interrupted stay and therapy services will resume as ordered. Modification and reinstatement of their goals will be made upon completion of therapy service reevaluations.   Blima Rich PT, DPT

## 2023-12-26 DIAGNOSIS — I161 Hypertensive emergency: Secondary | ICD-10-CM | POA: Diagnosis not present

## 2023-12-26 LAB — CULTURE, BLOOD (ROUTINE X 2)
Culture: NO GROWTH
Culture: NO GROWTH

## 2023-12-27 ENCOUNTER — Ambulatory Visit: Payer: Medicare Other | Admitting: Physician Assistant

## 2023-12-31 DIAGNOSIS — I2510 Atherosclerotic heart disease of native coronary artery without angina pectoris: Secondary | ICD-10-CM | POA: Diagnosis present

## 2023-12-31 DIAGNOSIS — R0989 Other specified symptoms and signs involving the circulatory and respiratory systems: Secondary | ICD-10-CM | POA: Diagnosis not present

## 2023-12-31 DIAGNOSIS — S82402A Unspecified fracture of shaft of left fibula, initial encounter for closed fracture: Secondary | ICD-10-CM | POA: Diagnosis present

## 2024-01-01 ENCOUNTER — Ambulatory Visit: Payer: Medicare Other | Admitting: Urology

## 2024-01-14 NOTE — Code Documentation (Signed)
 PATIENT NAME: Julian Fowler MEDICAL RECORD NUMBER: [id number] Birthday: [birth year]  Age: 89 y.o. Admit Date: 01/11/2024  Provider: Delton Coombes  Indication: PEA  Technical Description:  CPR performance duration: 16 minutes Was defibrillation or cardioversion used ? no Was external pacer placed ?  No Was patient intubated pre/post CPR ?  Yes Was transvenous pacer placed ?  No  Medications Administered Include      Yes/no Amiodarone   Atropin   Calcium   Epinephrine x4  Lidocaine   Magnesium   Norepinephrine Gtt started 20  Phenylephrine   Sodium bicarbonate x1  Vasopression    Evaluation Final Status - Was patient successfully resuscitated ? No If successfully resuscitated - what is current rhythm ?  If successfully resuscitated - what is current hemodynamic status ?   Miscellaneous Information Progressive respiratory instability through the day with increasing oxygen requirement, developed chest discomfort.  Oxygen needs increased to 12 L/min and empiric heparin initiated given suspicion of acute pulmonary embolism causing his decompensation.  Oxygen needs continue to increase, progressed to wider complex bradycardic rhythm likely junctional and then PEA.  CPR initiated.  Discussions undertaken with patient's family to confirm that he would want intubation and mechanical ventilation in the event of acute decompensation.  They confirmed that this was consistent with his wishes.  He was intubated without difficulty and CPR continued.  Given suspicion that PE may have been responsible for his decompensation TNK was given empirically during CPR.  He did obtain transient pulse but was never able to sustain.  CPR was discontinued after 16 minutes.   [place] Juanya Villavicencio 3/8/20256:22 PM

## 2024-01-14 NOTE — Progress Notes (Signed)
 PHARMACY - ANTICOAGULATION CONSULT NOTE  Pharmacy Consult for heparin Indication: pulmonary embolus  Allergies  Allergen Reactions   Latex Itching   Bactrim [Sulfamethoxazole-Trimethoprim] Other (See Comments)    Unknown reaction   Prednisone Other (See Comments)    Pt is high functioning and out of sorts.   Codeine Other (See Comments)    Grogginess/drowsiness    Tape Other (See Comments)    Skin tears, use paper tape    Patient Measurements: Height: 5\' 9"  (175.3 cm) Weight: 110.1 kg (242 lb 11.6 oz) IBW/kg (Calculated) : 70.7 Heparin Dosing Weight: 95 kg  Vital Signs: Temp: 98 F (36.7 C) (03/08 0754) Temp Source: Oral (03/08 0500) BP: 131/93 (03/08 1632) Pulse Rate: 116 (03/08 1632)  Labs: Recent Labs    12/20/23 1610 12/20/23 9604 01/08/2024 0558 12/28/2023 1124 01/06/2024 1242 12/20/2023 1452  HGB 11.5*  --   --  12.1*  --  12.2*  HCT 33.7*  --   --  36.4*  --  35.6*  PLT 407*  --   --  586*  --  517*  CREATININE 0.95 0.91 1.07  --   --  1.29*  TROPONINIHS  --   --   --  1,699* 1,678* 2,385*    Estimated Creatinine Clearance: 50.3 mL/min (A) (by C-G formula based on SCr of 1.29 mg/dL (H)).   Medical History: Past Medical History:  Diagnosis Date   Allergic rhinitis    Aortic stenosis    Arthritis    Atrial fibrillation (HCC)    BPH (benign prostatic hyperplasia)    Coronary artery calcification seen on CT scan    Easy bruisability    ED (erectile dysfunction)    Essential hypertension    GERD (gastroesophageal reflux disease)    H/O hiatal hernia    Hearing loss in left ear    History of asbestosis    History of shingles    Hyperlipidemia    Overactive bladder    PSVT (paroxysmal supraventricular tachycardia) (HCC)    Sleep apnea    No longer on CPAP following weight loss   Tinnitus     Assessment: 89 yoM with history of atrial fibrillation not on anticoagulation found to have new PE. Patient has been admitted to the hospital and receiving  subcutaneous heparin. D-dimer 2.52, troponin 2300. Pharmacy consulted to manage heparin infusion.   Goal of Therapy:  Heparin level 0.3-0.7 units/ml Monitor platelets by anticoagulation protocol: Yes   Plan:  Give 4000 units bolus x 1 Start heparin infusion at 1600 units/hr Check anti-Xa level in 8 hours and daily while on heparin Continue to monitor H&H and platelets  Ruben Im, PharmD Clinical Pharmacist 12/23/2023 4:55 PM Please check AMION for all [facility] Pharmacy numbers

## 2024-01-14 NOTE — Progress Notes (Signed)
 Post-mortem care completed.  Transfer to morgue pending. Dentures and one hearing aid (found in R ear) sent with pt. Pt to go to [facility] in [place], [place].  [phone])

## 2024-01-14 NOTE — Progress Notes (Addendum)
 PROGRESS NOTE   Subjective/Complaints:  Pt not well this morning, per [facility] LPN, pt had "panic attack" this morning during breakfast. Was given ativan and seemed to do better, but still working hard to breathe. Hasn't yet gotten neb treatment during initial eval.  Pt slept poorly. Felt the anxiety this morning was because he couldn't eat his breakfast, is very fixated on getting food before his meds.  Denies pain. LBM yesterday. Urinating ok.  Says he feels very tight in his chest, but denies CP or SOB but just repeats that he feels tight. Denies abd pain/n/v. Has had a cough for about a week, some sputum coming up.   On reassessment, still feels tight. Feels hot but room is warm.   On third reassessment, still very tachypneic with minimal improvement, feels anxious and is now very sweaty.   ROS: +insomnia   Objective:   DG CHEST PORT 1 VIEW Result Date: 12/17/2023 CLINICAL DATA:  Cough. EXAM: PORTABLE CHEST 1 VIEW COMPARISON:  12/09/2023 FINDINGS: The cardio pericardial silhouette is enlarged. Diffuse chronic interstitial lung disease again noted with extensive calcified pleural plaques. No substantial pleural effusion. No acute bony abnormality. IMPRESSION: Stable.  No new or progressive findings. Extensive chronic interstitial lung disease with extensive calcified bilateral pleural plaques compatible with prior asbestos exposure. Electronically Signed   By: Kennith Center M.D.   On: 01/06/2024 09:36   Recent Labs    12/20/23 0638  WBC 9.9  HGB 11.5*  HCT 33.7*  PLT 407*   Recent Labs    12/20/23 0639 01/02/2024 0558  NA 128* 125*  K 4.5 4.3  CL 96* 94*  CO2 21* 21*  GLUCOSE 98 101*  BUN 15 21  CREATININE 0.91 1.07  CALCIUM 8.3* 8.2*    Intake/Output Summary (Last 24 hours) at 01/13/2024 1025 Last data filed at 01/09/2024 0735 Gross per 24 hour  Intake 537 ml  Output 600 ml  Net -63 ml        Physical Exam: Vital  Signs Blood pressure 128/66, pulse (!) 108, temperature 98 F (36.7 C), resp. rate 20, SpO2 91%. Gen: mild respiratory distress, anxious HEENT: oral mucosa pink and moist, NCAT Cardio: tachycardic, no m/r/g appreciated but difficult to hear Chest: tachypneic, SpO2 89% on RA at rest, goes down to 87% if speaking, speaking in fragmented sentences, accessory muscle use, faint wheezing but breath sounds globally diminished and nearly silent on expiration. On reassessment, minimally improved after one albuterol neb. Very minimal improvement with duoneb. Sats 92% on 10L via nonrebreather.  Abd: soft, obese but non-distended, nonTTP, +BS throughout Ext: no edema noted, but LLE in splint Psych: appears anxious Left lower extremity splint clean/dry/intact  PRIOR EXAMS: MSK:     + LLE splint.  Full active range of motion all other extremities.       Neurologic exam:  Cognition: AAO to person, place, time and event.  + Endorsing hallucinations, seeing the wall move at nighttime Language: Fluent, No substitutions or neoglisms. No dysarthria. Names 3/3 objects correctly.  Memory: Recalls 1/3 objects at 5 minutes. + Deficits, chronic per wife, also new   Insight: Fair insight into current condition.  Mood: Pleasant  affect, appropriate mood.  Sensation: To light touch intact absent in left toes. Reflexes: 2+ in BL UE and LEs. Negative Hoffman's laterally. CN: 2-12 grossly intact.  Coordination: No apparent tremors. No ataxia on FTN      Strength:                RUE: 5/5 SA, 5/5 EF, 5/5 EE, 5/5 WE, 5/5 FF, 5/5 FA                LUE:  5/5 SA, 5/5 EF, 5/5 EE, 5/5 WE, 5/5 FF, 5/5 FA                RLE: 4/5 HF, 5/5 KE, 5/5  DF, 5/5  EHL, 5/5  PF                 LLE:  5/5 HF, 5/5 KE, 1/5  DF, 1/5  EHL, 1/5  PF   Assessment/Plan: 1. Functional deficits which require 3+ hours per day of interdisciplinary therapy in a comprehensive inpatient rehab setting. Physiatrist is providing close team supervision  and 24 hour management of active medical problems listed below. Physiatrist and rehab team continue to assess barriers to discharge/monitor patient progress toward functional and medical goals  Care Tool:  Bathing    Body parts bathed by patient: Right arm, Left arm, Chest, Abdomen, Front perineal area, Face, Left upper leg, Right upper leg   Body parts bathed by helper: Buttocks, Left lower leg, Right lower leg     Bathing assist Assist Level: Moderate Assistance - Patient 50 - 74%     Upper Body Dressing/Undressing Upper body dressing   What is the patient wearing?: Pull over shirt    Upper body assist Assist Level: Supervision/Verbal cueing    Lower Body Dressing/Undressing Lower body dressing      What is the patient wearing?: Incontinence brief, Pants     Lower body assist Assist for lower body dressing: Maximal Assistance - Patient 25 - 49%     Toileting Toileting    Toileting assist Assist for toileting: Total Assistance - Patient < 25%     Transfers Chair/bed transfer  Transfers assist  Chair/bed transfer activity did not occur: Refused        Locomotion Ambulation   Ambulation assist   Ambulation activity did not occur: Refused          Walk 10 feet activity   Assist  Walk 10 feet activity did not occur: Refused        Walk 50 feet activity   Assist Walk 50 feet with 2 turns activity did not occur: Refused         Walk 150 feet activity   Assist Walk 150 feet activity did not occur: Refused         Walk 10 feet on uneven surface  activity   Assist Walk 10 feet on uneven surfaces activity did not occur: Refused         Wheelchair     Assist Is the patient using a wheelchair?: Yes Type of Wheelchair: Manual Wheelchair activity did not occur: Refused         Wheelchair 50 feet with 2 turns activity    Assist    Wheelchair 50 feet with 2 turns activity did not occur: Refused       Wheelchair  150 feet activity     Assist  Wheelchair 150 feet activity did not occur: Refused  Blood pressure 128/66, pulse (!) 108, temperature 98 F (36.7 C), resp. rate 20, SpO2 91%.  Medical Problem List and Plan: 1. Functional deficits secondary to debility after non-STEMI with conservative care complicated by left fibular fracture after fall.NWB.  Follow-up outpatient orthopedic services DR Victorino Dike to consider need for surgical intervention             -patient may shower with cast covered              -ELOS/Goals: 18 to 21 days, supervision level PT/OT/SLP             -D3 supplement started  -grounds pass ordered  READMITTED AS MENTIONED BELOW 2.  Antithrombotics: -DVT/anticoagulation.  Subcutaneous heparin 5000U q8h initiated 12/18/2023 -antiplatelet therapy: Plavix 75 mg daily (for at least 1 month preferably 3mos followed by ASA 81mg ) 3. Pain Management: Voltaren gel 4 times daily, tramadol as needed, tylenol 1000mg  TID 4. Anxiety: conitnue Ativan 0.5 mg 3 times daily as needed.  Provide emotional support             -antipsychotic agents: Seroquel 50 mg nightly             -delirium precautions  -Memantine 5mg  BID 5. Neuropsych/cognition: This patient is not capable of making decisions on his own behalf. 6. Skin/Wound Care: Routine skin checks 7. Fluids/Electrolytes/Nutrition: Routine in and outs with follow-up chemistries, continue vitamins/supplements 8.  Left oblique fibular fracture.  Nonweightbearing.  Transition from cam boot to well-padded plastic posterior short leg splint with stirrup.  No current plan for surgical intervention. Zinc supplement started  9.  Hypertension.  Norvasc 10 mg daily, hydralazine 100 mg 3 times daily, Avapro 150 mg daily, Imdur 60 mg daily, Aldactone 25 mg daily.  Monitor with increased mobility. Magnesium gluconate 250mg  ordered HS -12/22/2023 appears norvasc was changed to 5mg  daily yesterday. BPs stable today. Monitor   Vitals:   12/19/23 1843  12/19/23 2006 12/19/23 2056 12/20/23 0509  BP: 114/70 122/74 133/61 (!) 133/97   12/20/23 1425 12/20/23 1506 12/20/23 2003 12/20/23 2038  BP: (!) 101/57 96/79 115/67 115/67   12/30/2023 0423 01/08/2024 0500 12/30/2023 0754  BP: (!) 119/56 96/60 128/66     10.  OSA/interstitial lung disease/asbestosis: continue inhalers as directed.  Monitor oxygen saturations, O2 PRN, Mucinex 1200mg  BID -01/05/2024 pt with mild respiratory distress this morning, given ativan by LPN prior to eval, on eval having accessory muscle use and wheeze with nearly silent expiration -albuterol neb x1 given, not much improvement -CXR without acute findings but extensive ILD and pleural plaques -ordered duoneb for now and q4h PRN -ordered 125mg  IV solumedrol ordered but IV team needed -after duoneb x1, not much improvement; still no IV; will call PCCM due to respiratory distress. Ordered CBC, trop, EKG.  -11:02 AM spoke with Brooke from [employer], she will be down shortly, but asks that RR team also come assess; they will be admitting him -11:35 AM Dr. Delton Coombes of PCCM readmitting pt, appreciate their help immensely.  11.  Chronic hyponatremia.  Sodium chloride 1g tablets TID.    -01/13/2024 Na 125 today, down but fairly stable; monitor 12.  Hyperlipidemia.  Pravachol 40mg  qhs 13.  GERD.  Protonix 40mg  daily  14. 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 Insomnia: asked nursing to place sign on door to minimize interruptions between 9pm and 5am  16. BPH/recurrent UTIs: completed Rocephin IV  on 12/16/23, on nitrofurantoin 50mg  daily no. Resume rapaflo 8mg  on d/c? Was not restarted, will monitor urinary status for now.     I spent >56mins performing patient care related activities, including face to face time over multiple reassessments, documentation time, med management/neb management, discussion of meds/respiratory concerns with patient and nursing  staff, consultation of PCCM and overall coordination of care.    LOS: 2 days A FACE TO FACE EVALUATION WAS PERFORMED  [address] 01/04/2024, 10:25 AM

## 2024-01-14 NOTE — H&P (Signed)
 NAME:  Julian Fowler, MRN:  [id number], DOB:  May 17, 1935, LOS: 0 ADMISSION DATE:  (Not on file), CONSULTATION DATE: 12/31/2023 REFERRING:  Judie Petit Street, PA-C, CHIEF COMPLAINT: Respiratory distress  History of Present Illness:  89 year old man who has been followed in our office by Dr. Maple Hudson for asbestosis (with pleural plaques, ILD) as well as COPD/asthma.  He also has a history of CAD, atrial fibrillation/SVT (not on anticoagulation), anxiety, untreated OSA, hypertension, hyperlipidemia, chronic hyponatremia.  He has been on [facility] inpatient rehab since 12/19/2023 following fracture of the distal left fibula.  Initially admitted to [facility] 12/04/2023 with hypertension, lightheadedness/dizziness.  Treated with nitroglycerin for hypertensive emergency.  Course complicated by some agitated delirium that was treated with Haldol (MRI negative).  Subsequently found to have suspected UTI that was treated with ceftriaxone.  He transferred to [facility] 2/25 for chest pain and positive troponin, non-ST elevation MI.  Echocardiogram showed LVEF 50-55% with no regional wall motion abnormalities and he was treated medically, started on Plavix with plans to transition to aspirin in 3 months.  Mental status did improve, was treated with Seroquel nightly.  Unfortunately suffered mechanical fall working with PT 12/14/2023 that resulted in a closed displaced left distal fibular fracture.  He was seen by orthopedics and surgery was deferred.  He was splinted and was to be nonweightbearing.  He moved to CIR on 12/19/2023.  Morning of 3/8 the patient developed acute dyspnea and respiratory distress.  May have had an anxiety component per rehab RN.  It happened right around breakfast but he denied any choking or aspiration.  Noted to have some stridor, said that he "felt tight".  No overt chest pain.  ECG with atrial fibrillation, more tachycardic 120s but no new ST or T wave findings.  Associated with desaturation, required 6 L/min  nasal cannula.  Chest x-ray with stable changes consistent with his ILD, no new infiltrates.  Initial troponin elevated 1699.  He will move to the ICU for further evaluation of his acute respiratory distress  Pertinent  Medical History   Past Medical History:  Diagnosis Date   Allergic rhinitis    Aortic stenosis    Arthritis    Atrial fibrillation (HCC)    BPH (benign prostatic hyperplasia)    Coronary artery calcification seen on CT scan    Easy bruisability    ED (erectile dysfunction)    Essential hypertension    GERD (gastroesophageal reflux disease)    H/O hiatal hernia    Hearing loss in left ear    History of asbestosis    History of shingles    Hyperlipidemia    Overactive bladder    PSVT (paroxysmal supraventricular tachycardia) (HCC)    Sleep apnea    No longer on CPAP following weight loss   Tinnitus      Significant Hospital Events: Including procedures, antibiotic start and stop dates in addition to other pertinent events     Interim History / Subjective:  Feeling a bit better on arrival to the ICU, less tight, less shortness of breath.  More comfortable respiratory pattern.  Still denies any chest pain  Objective   There were no vitals taken for this visit.       No intake or output data in the 24 hours ending 12/30/2023 1343 There were no vitals filed for this visit.  Examination: General: Obese man laying in bed tachypneic with some moderate respiratory distress.  He is diaphoretic HENT: Inspiratory and expiratory stridor.  Oropharynx otherwise clear.  No secretions.  Strong voice. Lungs: Decent air movement with bilateral inspiratory crackles.  Referred upper airway noise.  Difficult to discern whether he has true wheezing Cardiovascular: Irregularly irregular, tachycardic 120 Abdomen: Obese and protuberant, nondistended, nontender with positive bowel sounds Extremities: No significant edema Neuro: Awake, a bit nervous but redirectable.  Oriented.   Able to follow commands, move all extremities. GU: Deferred  Resolved Hospital Problem list     Assessment & Plan:   Acute respiratory failure with hypoxemia.  Etiology unclear.  He does have CAD with elevated troponin.  ECG reassuring but consider non-ST elevation MI.  At risk for pulmonary embolism.  History of asthma although no overt wheezing, he does have stridor (question cause versus result of his respiratory distress). -CT-PA ordered -Follow serial troponin, stat aspirin ordered -Reasonable to treat empirically with single dose corticosteroid although no clear evidence of asthma flare.  May help with stridor as well.  Will try to avoid continuing as he tolerates steroids poorly, has been dealing with agitated delirium through the hospitalization -Bronchodilators converted to Brovana, Pulmicort, albuterol as needed -Nasal cannula oxygen.  Looks more comfortable, does not need BiPAP or intubation at this time  COPD/asthma Asbestosis with associated ILD, pleural plaques -Single dose steroids as above -Bronchodilators as ordered -No evidence for new infiltrate, progression of ILD on his chest x-ray.  Will be able to further assess with CT chest today  CAD with recent and non-ST elevation MI -Continue Plavix, aspirin added given the acute changes above -Following troponin trend as above -Serial EKG. -Will involve cardiology depending on course and the above  Hypertension Hyperlipidemia -Has been managed with amlodipine, hydralazine, Avapro, Imdur, Aldactone.  Plan to continue.  Adjust based on blood pressure trends -Continue Pravachol  Chronic hyponatremia -Follow electrolytes -Sodium chloride tablets ordered  Left fibular fracture -Splint in place -Nonweightbearing status -No plans for surgical intervention -Pain control: Voltaren gel.  He is allergic to codeine but was tolerating tramadol on rehab  Delirium, anxiety.  -Careful with sedating medications -Okay to keep  Seroquel nightly -Xanax held for now  Best Practice (right click and "Reselect all SmartList Selections" daily)   Diet/type: Regular consistency (see orders) DVT prophylaxis prophylactic heparin  Pressure ulcer(s): N/A GI prophylaxis: PPI Lines: N/A Foley:  N/A Code Status:  full code Last date of multidisciplinary goals of care discussion [Family updated 3/8]  Labs   CBC: Recent Labs  Lab 12/16/23 0512 12/17/23 0623 12/18/23 0443 12/20/23 0638 01/10/2024 1124  WBC 13.2* 9.3 11.5* 9.9 15.0*  NEUTROABS  --   --   --  7.2  --   HGB 11.6* 10.8* 11.6* 11.5* 12.1*  HCT 33.8* 31.3* 33.4* 33.7* 36.4*  MCV 88.9 87.7 87.4 87.3 89.0  PLT 310 359 382 407* 586*    Basic Metabolic Panel: Recent Labs  Lab 12/15/23 0348 12/16/23 0512 12/17/23 0623 12/19/23 0442 12/20/23 0638 12/20/23 0639 12/30/2023 0558  NA 128* 127* 129* 126* 128* 128* 125*  K 3.7 3.7 4.0 4.2 4.6 4.5 4.3  CL 94* 95* 96* 98 97* 96* 94*  CO2 23 22 23  20* 24 21* 21*  GLUCOSE 95 94 88 88 99 98 101*  BUN 11 13 11 15 15 15 21   CREATININE 0.85 0.77 0.79 0.86 0.95 0.91 1.07  CALCIUM 8.3* 8.4* 8.2* 7.7* 8.3* 8.3* 8.2*  MG  --   --  1.5* 1.9  --   --   --   PHOS 3.5 3.7  3.7  --   --  4.1 4.4   GFR: Estimated Creatinine Clearance: 60.6 mL/min (by C-G formula based on SCr of 1.07 mg/dL). Recent Labs  Lab 12/17/23 0623 12/18/23 0443 12/20/23 0638 12/22/2023 1124  WBC 9.3 11.5* 9.9 15.0*    Liver Function Tests: Recent Labs  Lab 12/17/23 4696 12/19/23 0442 12/20/23 0638 12/20/23 0639 12/18/2023 0558  AST  --  32 32  --   --   ALT  --  38 38  --   --   ALKPHOS  --  59 62  --   --   BILITOT  --  0.6 0.8  --   --   PROT  --  5.6* 5.8*  --   --   ALBUMIN 2.4* 2.5* 2.5* 2.5* 2.6*   No results for input(s): "LIPASE", "AMYLASE" in the last 168 hours. No results for input(s): "AMMONIA" in the last 168 hours.  ABG    Component Value Date/Time   TCO2 25 11/02/2018 0746     Coagulation Profile: No results  for input(s): "INR", "PROTIME" in the last 168 hours.  Cardiac Enzymes: No results for input(s): "CKTOTAL", "CKMB", "CKMBINDEX", "TROPONINI" in the last 168 hours.  HbA1C: No results found for: "HGBA1C"  CBG: No results for input(s): "GLUCAP" in the last 168 hours.  Review of Systems:   As per HPI  Past Medical History:  He,  has a past medical history of Allergic rhinitis, Aortic stenosis, Arthritis, Atrial fibrillation (HCC), BPH (benign prostatic hyperplasia), Coronary artery calcification seen on CT scan, Easy bruisability, ED (erectile dysfunction), Essential hypertension, GERD (gastroesophageal reflux disease), H/O hiatal hernia, Hearing loss in left ear, History of asbestosis, History of shingles, Hyperlipidemia, Overactive bladder, PSVT (paroxysmal supraventricular tachycardia) (HCC), Sleep apnea, and Tinnitus.   Surgical History:   Past Surgical History:  Procedure Laterality Date   BACK SURGERY  2014   herniated L1, L2   Dr Channing Mutters   BIOPSY  09/18/2023   Procedure: BIOPSY;  Surgeon: Corbin Ade, MD;  Location: [facility];  Service: Endoscopy;;   BRAIN SURGERY     CEREBRAL EMBOLIZATION  12/2011   "radiation therapy-did not work"   CHOLECYSTECTOMY  6/98   COLONOSCOPY  09/2009   Dr. Cleotis Nipper: normal, internal hemorrhoids    COLONOSCOPY N/A 02/28/2017   Dr. Jena Gauss: Hemorrhoids, grade 3, mild diverticulosis.   CRANIECTOMY FOR EXCISION OF ACOUSTIC NEUROMA  3/95   ESOPHAGOGASTRODUODENOSCOPY (EGD) WITH PROPOFOL N/A 09/18/2023   Procedure: ESOPHAGOGASTRODUODENOSCOPY (EGD) WITH PROPOFOL;  Surgeon: Corbin Ade, MD;  Location: [facility];  Service: Endoscopy;  Laterality: N/A;  930am, asa 3   MINOR AMPUTATION OF DIGIT Left 12/31/2018   Procedure: REVISION AMPUTATION OF LEFT INDEX FINGER, IRRIGATION AND DEBRIDEMENT LEFT INDEX FINGER;  Surgeon: Ernest Mallick, MD;  Location: [facility] OR;  Service: Orthopedics;  Laterality: Left;   POLYPECTOMY  09/18/2023   Procedure:  POLYPECTOMY;  Surgeon: Corbin Ade, MD;  Location: [facility];  Service: Endoscopy;;   RADIOLOGY WITH ANESTHESIA N/A 07/07/2014   Procedure: EMBOLIZATION;  Surgeon: Oneal Grout, MD;  Location: [facility] OR;  Service: Radiology;  Laterality: N/A;   TOTAL KNEE ARTHROPLASTY Left 07/06/2013   Procedure: LEFT TOTAL KNEE ARTHROPLASTY;  Surgeon: Loanne Drilling, MD;  Location: [facility];  Service: Orthopedics;  Laterality: Left;   TOTAL KNEE ARTHROPLASTY Right 01/18/2014   Procedure: RIGHT TOTAL KNEE ARTHROPLASTY;  Surgeon: Loanne Drilling, MD;  Location: [facility];  Service: Orthopedics;  Laterality: Right;   TRIGGER FINGER RELEASE  2003   (thumb) middle finger (2006)     Social History:   reports that he quit smoking about 37 years ago. His smoking use included cigarettes. He started smoking about 66 years ago. He has a 45 pack-year smoking history. He has never been exposed to tobacco smoke. He has never used smokeless tobacco. He reports that he does not drink alcohol and does not use drugs.   Family History:  His family history includes Alcohol abuse in his sister; Alzheimer's disease in his sister; Breast cancer in his mother; Cancer in his father; Heart attack in his mother; Hypertension in his sister; Kidney disease in his sister. There is no history of Colon cancer.   Allergies Allergies  Allergen Reactions   Latex Itching   Bactrim [Sulfamethoxazole-Trimethoprim] Other (See Comments)    Unknown reaction   Prednisone Other (See Comments)    Pt is high functioning and out of sorts.   Codeine Other (See Comments)    Grogginess/drowsiness    Tape Other (See Comments)    Skin tears, use paper tape     Home Medications  Prior to Admission medications   Medication Sig Start Date End Date Taking? Authorizing Provider  acetaminophen (TYLENOL) 325 MG tablet Take 325-650 mg by mouth 2 (two) times daily as needed for moderate pain (pain score 4-6), fever or headache.    [provider]  amLODipine (NORVASC) 10 MG tablet Take 1 tablet (10 mg total) by mouth daily. 12/20/23   Rai, Delene Ruffini, MD  azelastine (ASTELIN) 0.1 % nasal spray 1-2 puffs each nostril every 8 hours if needed for drainage 12/17/17   Jetty Duhamel D, MD  Cholecalciferol (VITAMIN D-3 PO) Take 1 tablet by mouth daily with lunch.    [provider]  clopidogrel (PLAVIX) 75 MG tablet Take 1 tablet (75 mg total) by mouth daily. 12/20/23   Rai, Delene Ruffini, MD  esomeprazole (NEXIUM) 40 MG capsule Take 1 capsule (40 mg total) by mouth 2 (two) times daily before a meal. 11/19/23   Tiffany Kocher, PA-C  hydrALAZINE (APRESOLINE) 100 MG tablet Take 1 tablet (100 mg total) by mouth 3 (three) times daily. 12/19/23   Rai, Ripudeep K, MD  irbesartan (AVAPRO) 150 MG tablet Take 1 tablet (150 mg total) by mouth daily. 12/20/23   Rai, Delene Ruffini, MD  isosorbide mononitrate (IMDUR) 60 MG 24 hr tablet Take 1 tablet (60 mg total) by mouth daily. 12/20/23   Rai, Ripudeep K, MD  latanoprost (XALATAN) 0.005 % ophthalmic solution Place 1 drop into both eyes at bedtime.  09/01/12   [provider]  LORazepam (ATIVAN) 1 MG tablet TAKE 1 TABLET BY MOUTH THREE TIMES DAILY AS NEEDED FOR ANXIETY Patient taking differently: Take 1 mg by mouth See admin instructions. Take 1 tablet (1mg ) by mouth nightly at bedtime for sleep. May take an additional 1 tablet twice daily as needed for anxiety. 06/24/23   Jetty Duhamel D, MD  memantine (NAMENDA) 5 MG tablet Take 5 mg by mouth 2 (two) times daily. 10/26/23   [provider]  Multiple Vitamins-Minerals (CENTRUM SILVER 50+MEN) TABS Take 1 tablet by mouth daily.    [provider]  Multiple Vitamins-Minerals (ZINC PO) Take 1 tablet by mouth daily with lunch.    [provider]  nitrofurantoin (MACRODANTIN) 50 MG capsule Take 50 mg by mouth at bedtime.    [provider]  nitroGLYCERIN (NITROSTAT) 0.4 MG SL  tablet Place 1 tablet (0.4 mg total) under the tongue  every 5 (five) minutes x 3 doses as needed for chest pain. 12/19/23   Rai, Delene Ruffini, MD  Polyethyl Glycol-Propyl Glycol (SYSTANE OP) Place 1 drop into both eyes 2 (two) times daily as needed (dry eyes).    [provider]  polyethylene glycol (MIRALAX / GLYCOLAX) packet Take 17 g by mouth at bedtime.    [provider]  potassium chloride SA (KLOR-CON M) 20 MEQ tablet Take 1 tablet (20 mEq total) by mouth daily. 05/08/23   Jonelle Sidle, MD  pravastatin (PRAVACHOL) 40 MG tablet Take 1 tablet by mouth at bedtime.  10/05/14   [provider]  PROAIR HFA 108 (90 Base) MCG/ACT inhaler INHALE 2 PUFFS BY MOUTH EVERY 6 HOURS AS NEEDED FOR WHEEZING FOR SHORTNESS OF BREATH 03/18/20   Young, Joni Fears D, MD  QUEtiapine (SEROQUEL) 50 MG tablet Take 1 tablet (50 mg total) by mouth at bedtime. 12/19/23   Rai, Delene Ruffini, MD  silodosin (RAPAFLO) 8 MG CAPS capsule Take 1 capsule (8 mg total) by mouth at bedtime. 06/26/23   McKenzie, Mardene Celeste, MD  sodium chloride 1 g tablet Take 1 g by mouth 3 (three) times daily. 02/08/21   [provider]  spironolactone (ALDACTONE) 25 MG tablet Take 1 tablet (25 mg total) by mouth daily. 12/19/23   Rai, Ripudeep Kirtland Bouchard, MD  SYMBICORT 160-4.5 MCG/ACT inhaler INHALE 2 PUFFS BY MOUTH  TWICE DAILY THEN  RINSE  MOUTH Patient taking differently: Inhale 2 puffs into the lungs 2 (two) times daily as needed (shortness of breath, wheezing). INHALE 2 PUFFS BY MOUTH  TWICE DAILY THEN  RINSE  MOUTH 01/31/21   Young, Joni Fears D, MD  Wheat Dextrin (BENEFIBER PO) Take 1 Scoop by mouth 2 (two) times daily.    [provider]  zolpidem (AMBIEN) 10 MG tablet Take 10 mg by mouth at bedtime. 01/24/16   [provider]     Critical care time: 60 min     Levy Pupa, MD, PhD 12/20/2023, 1:43 PM [facility] [phone] or if no answer before 7:00PM call [phone] For any issues after 7:00PM please call eLink  [phone]

## 2024-01-14 NOTE — Progress Notes (Signed)
 Occupational Therapy Discharge Summary  Patient Details  Name: Julian Fowler MRN: [id number] Date of Birth: 20-Dec-1934  Occupational Therapy Discharge Note  This patient was unable to complete the inpatient rehab program due to change in medical status; therefore did not meet their long term goals. Pt left the program at a max A to +2 assist level for their functional ADLs. This patient is being discharged from OT services at this time.  BIMS at time of d/c  Pt unable to complete due to medical status  See CareTool for functional status details.  If the patient is able to return to inpatient rehabilitation within 3 midnights, this may be considered an interrupted stay and therapy services will resume as ordered. Modification and reinstatement of their goals will be made upon completion of therapy service reevaluations.     Teddie Curd E Shellie Goettl, MS, OTR/L  12/23/2023, 10:39 AM

## 2024-01-14 NOTE — Death Summary Note (Signed)
 DEATH SUMMARY   Patient Details  Name: Julian Fowler MRN: [id number] DOB: [birth year]  Admission/Discharge Information   Admit Date:  2024/01/08  Date of Death: Date of Death: 01-08-24  Time of Death: Time of Death: 01/22/38  Length of Stay: 1  Referring Physician: Juliette Alcide, MD   Reason(s) for Hospitalization  Respiratory distress, hypoxemia  Diagnoses  Preliminary cause of death:  Acute respiratory failure with hypoxemia, due to suspected pulmonary embolism (not confirmed)  Secondary Diagnoses (including complications and co-morbidities):  Principal Problem:   Acute respiratory failure with hypoxia (HCC) Active Problems:   Asthma, mild intermittent, well-controlled   GERD   TOBACCO ABUSE, HX OF   Hypertension   OSA (obstructive sleep apnea)   Chronic hyponatremia   Asbestosis (HCC)   ILD (interstitial lung disease) (HCC)   Elevated PSA   Permanent atrial fibrillation (HCC) - Not on anticoagulation due to history of falls and skin tears   Obesity, Class II, BMI 35-39.9   Respiratory distress   Cardiac arrest [facility])   Coronary artery disease   Fracture of left fibula   Suspected pulmonary embolism   Brief Hospital Course (including significant findings, care, treatment, and services provided and events leading to death)  Julian Fowler is a 89 y.o. year old male with asbestosis (with pleural plaques, ILD) as well as COPD/asthma.  He also has a history of CAD, atrial fibrillation/SVT (not on anticoagulation), anxiety, untreated OSA, hypertension, hyperlipidemia, chronic hyponatremia.  He has been on [facility] inpatient rehab since 12/19/2023 following fracture of the distal left fibula.   Initially admitted to [facility] 12/04/2023 with hypertension, lightheadedness/dizziness.  Treated with nitroglycerin for hypertensive emergency.  Course complicated by some agitated delirium that was treated with Haldol (MRI negative).  Subsequently found to have suspected UTI that  was treated with ceftriaxone.  He transferred to [facility] 2/25 for chest pain and positive troponin, non-ST elevation MI.  Echocardiogram showed LVEF 50-55% with no regional wall motion abnormalities and he was treated medically, started on Plavix with plans to transition to aspirin in 3 months.  Mental status did improve, was treated with Seroquel nightly.  Unfortunately suffered mechanical fall working with PT 12/14/2023 that resulted in a closed displaced left distal fibular fracture.  He was seen by orthopedics and surgery was deferred.  He was splinted and was to be nonweightbearing.  He moved to CIR on 12/19/2023.   Morning of 01-08-24 the patient developed acute dyspnea and respiratory distress.  May have had an anxiety component per his rehab RN.  It happened right around breakfast but he denied any choking or aspiration.  Noted to have some stridor, said that he "felt tight".  No overt chest pain.  ECG with atrial fibrillation, more tachycardic 120s but no new ST or T wave findings.  Associated with desaturation, required 6 L/min nasal cannula.  Chest x-ray with stable changes consistent with his ILD, no new infiltrates.  Initial troponin elevated 1699.  He was admitted to the ICU for further evaluation of his acute respiratory distress.   Etiology of his hypoxemic respiratory failure not immediately clear.  There was no wheeze or evidence for overt bronchospasm.  He was treated empirically with corticosteroids and bronchodilators.  Also no evidence of progression of pulmonary edema or his ILD on chest x-ray.  We treated empirically for possible aspiration pneumonia as well as a flare of obstructive lung disease.  Serial troponins were checked and he was continued on his Plavix,  aspirin.  CT-PA was ordered in the morning but was unable to be performed because the patient was developing orthopnea and more dyspnea so it was felt that he would not be able to lay supine.  Through the day on December 29, 2023 he progressively  worsened, required increasing FiO2.  In the absence of CT-PA, empiric heparin infusion was started given suspicion for possible pulmonary embolism as his cough decompensation.  It was felt that this would also treat any component of ACS although his EKG was reassuring.  At 1730 he was continuing to show desaturations even after up titration of his FiO2 to 15 l/min high flow nasal cannula.  He was going to be started on BiPAP but unfortunately decompensated, became unresponsive and pulseless.  His telemetry showed evolving bradycardia, consistent with possible junctional rhythm.  CPR immediately initiated.  Patient family was available and it was confirmed that he would want all aggressive care to achieve stability including intubation mechanical ventilation, CPR.  He was ET intubated and CPR continued.  He maintained a wide-complex bradycardic rhythm but was PEA.   Given the suspicion of acute PE as a cause of his decompensation TNK was given. There was brief ROSC after several rounds of CPR but this could not be maintained consistently.  Unfortunately ROSC was never fully obtained.  CPR was discontinued after 16 minutes.  Time of death was 41.     Pertinent Labs and Studies  Significant Diagnostic Studies DG CHEST PORT 1 VIEW Result Date: 12/29/2023 CLINICAL DATA:  Cough. EXAM: PORTABLE CHEST 1 VIEW COMPARISON:  12/09/2023 FINDINGS: The cardio pericardial silhouette is enlarged. Diffuse chronic interstitial lung disease again noted with extensive calcified pleural plaques. No substantial pleural effusion. No acute bony abnormality. IMPRESSION: Stable.  No new or progressive findings. Extensive chronic interstitial lung disease with extensive calcified bilateral pleural plaques compatible with prior asbestos exposure. Electronically Signed   By: Kennith Center M.D.   On: 12/29/23 09:36   CT FOOT RIGHT WO CONTRAST Result Date: 12/15/2023 CLINICAL DATA:  Foot pain and swelling after recent fall. Fracture  suspected. EXAM: CT OF THE RIGHT FOOT WITHOUT CONTRAST TECHNIQUE: Multidetector CT imaging of the right foot was performed according to the standard protocol. Multiplanar CT image reconstructions were also generated. RADIATION DOSE REDUCTION: This exam was performed according to the departmental dose-optimization program which includes automated exposure control, adjustment of the mA and/or kV according to patient size and/or use of iterative reconstruction technique. COMPARISON:  Radiographs 12/14/2023 and 07/17/2019. FINDINGS: Bones/Joint/Cartilage The bones are demineralized. There is no definite evidence of acute fracture or dislocation. Minimal irregularity of the base of the 5th metatarsal without definite acute fracture. Stable old healed fracture of the distal 5th metatarsal. Hammertoe deformities noted. There are scattered degenerative changes, most advanced at the 1st metatarsophalangeal joint. Mild midfoot degenerative changes are also noted. Fragmented spurring of the medial malleolus has a nonacute appearance. No significant joint effusions are identified. Ligaments Suboptimally assessed by CT. Muscles and Tendons As evaluated by CT, the ankle and foot tendons appear intact without significant tenosynovitis. Generalized atrophy of the foot musculature. Soft tissues Diffuse dorsal subcutaneous swelling without evidence of focal fluid collection, foreign body or soft tissue emphysema. IMPRESSION: 1. No definite evidence of acute fracture or dislocation in the right foot. Minimal irregularity of the base of the 5th metatarsal without definite fracture. Correlate with point tenderness and consider radiographic follow-up. 2. Diffuse dorsal subcutaneous swelling without evidence of focal fluid collection, foreign body or soft tissue emphysema.  3. Scattered degenerative changes, most advanced at the 1st metatarsophalangeal joint. Electronically Signed   By: Carey Bullocks M.D.   On: 12/15/2023 16:09   CT  ANKLE LEFT WO CONTRAST Result Date: 12/15/2023 CLINICAL DATA:  Recent fall.  Left ankle fracture. EXAM: CT OF THE LEFT ANKLE WITHOUT CONTRAST TECHNIQUE: Multidetector CT imaging of the left ankle was performed according to the standard protocol. Multiplanar CT image reconstructions were also generated. RADIATION DOSE REDUCTION: This exam was performed according to the departmental dose-optimization program which includes automated exposure control, adjustment of the mA and/or kV according to patient size and/or use of iterative reconstruction technique. COMPARISON:  Radiographs 12/12/2022. FINDINGS: Bones/Joint/Cartilage The ankle is splinted. The bones are diffusely demineralized. There is an oblique mildly displaced fracture of the distal fibular metadiaphysis which extends into the anterior aspect of the distal tibiofibular joint. There is a small avulsion fracture of the tip of the medial malleolus, best seen on the sagittal images. In addition, there is a nondisplaced intra-articular fracture of the posterior malleolus, also best seen on the sagittal images. There is no widening of the ankle mortise. The talus is located. No evidence of acute tarsal bone fracture or significant ankle joint effusion. There are mild midfoot degenerative changes. Ligaments Suboptimally assessed by CT. Muscles and Tendons As evaluated by CT, the ankle tendons appear intact without entrapment in the fractures. Soft tissues Mild soft tissue swelling around the ankle, greatest laterally. No focal fluid collection, foreign body or soft tissue emphysema. There is some calcification within the posterior aspect of the plantar fascia. IMPRESSION: 1. Trimalleolar fracture of the left ankle as described. The distal fibular component is mildly displaced. The tibial components are nondisplaced. 2. No widening of the ankle mortise or evidence of tarsal bone fracture. 3. The ankle tendons appear intact without entrapment in the fractures.  Electronically Signed   By: Carey Bullocks M.D.   On: 12/15/2023 15:58   DG Foot 2 Views Right Result Date: 12/14/2023 CLINICAL DATA:  Right foot pain. EXAM: RIGHT FOOT - 2 VIEW COMPARISON:  07/17/2019 FINDINGS: No acute fracture. Remote healed fifth metatarsal fracture unchanged from prior. Moderate to advanced arthropathy of the first metatarsal phalangeal joint, stable from prior. Hammertoe deformity of the toes. Midfoot degenerative spurring. Plantar calcaneal spur and Achilles tendon enthesophyte. No erosions. There is dorsal soft tissue edema. IMPRESSION: 1. Dorsal soft tissue edema. 2. No acute fracture or subluxation. 3. Moderate to advanced arthropathy of the first metatarsophalangeal joint, stable from prior. 4. Hammertoe deformity of the toes. Electronically Signed   By: Narda Rutherford M.D.   On: 12/14/2023 20:15   DG Tibia/Fibula Left Result Date: 12/14/2023 CLINICAL DATA:  Closed fracture of left distal fibula.  Swelling. EXAM: LEFT TIBIA AND FIBULA - 2 VIEW COMPARISON:  Ankle radiograph yesterday FINDINGS: Mildly displaced distal fibular fracture, proximal to the ankle mortise. No significant change in alignment from yesterday's ankle exam. The proximal fibula is intact. There is no tibial fracture. Mild tibial talar degenerative spurring. Knee arthroplasty is intact. Generalized subcutaneous edema. IMPRESSION: 1. Mildly displaced distal fibular fracture. No significant change in alignment from yesterday's ankle exam. 2. No additional fracture of the lower leg. Electronically Signed   By: Narda Rutherford M.D.   On: 12/14/2023 20:13   DG Ankle Complete Left Result Date: 12/13/2023 CLINICAL DATA:  Recent fall with left ankle pain, initial encounter EXAM: LEFT ANKLE COMPLETE - 3+ VIEW COMPARISON:  None Available. FINDINGS: Minimally displaced oblique fracture of the distal  fibular metaphysis is seen. Irregularity adjacent to the medial malleolus is noted suspicious for avulsion. No other focal  fracture is seen. Mild lateral soft tissue swelling is seen. Degenerative changes of the tarsal bones are noted. IMPRESSION: Oblique fibular fracture as described. Findings suspicious for avulsion from the tip of the medial malleolus. Electronically Signed   By: Alcide Clever M.D.   On: 12/13/2023 20:23   ECHOCARDIOGRAM LIMITED Result Date: 12/12/2023    ECHOCARDIOGRAM LIMITED REPORT   Patient Name:   Julian Fowler Date of Exam: 12/12/2023 Medical Rec #:  [id number]       Height:       69.0 in Accession #:    [id number]      Weight:       242.8 lb Date of Birth:  11/27/1934       BSA:          2.243 m Patient Age:    88 years        BP:           160/90 mmHg Patient Gender: M               HR:           88 bpm. Exam Location:  Inpatient Procedure: Limited Echo, Cardiac Doppler, Color Doppler and Intracardiac            Opacification Agent (Both Spectral and Color Flow Doppler were            utilized during procedure). Indications:    ; 122-I22.9 Subsequent ST elevation (STEM) and non-ST elevation                 (NSTEMI) myocardial infarction  History:        Patient has prior history of Echocardiogram examinations, most                 recent 05/20/2023. Previous Myocardial Infarction, Acute MI and                 CAD, Abnormal ECG, Aortic Valve Disease, Arrythmias:Atrial                 Fibrillation, Signs/Symptoms:Murmur; Risk Factors:Hypertension,                 Dyslipidemia, Sleep Apnea and Current Smoker. Aortic stenosis.  Sonographer:    Sheralyn Boatman RDCS Referring Phys: 53 DAVID W [facility]  Sonographer Comments: Technically difficult study due to poor echo windows. Image acquisition challenging due to patient body habitus. Limited echo to assess wall motion. IMPRESSIONS  1. Left ventricular ejection fraction, by estimation, is 50 to 55%. The left ventricle has low normal function. The left ventricle has no regional wall motion abnormalities. There is moderate concentric left ventricular hypertrophy. Left  ventricular diastolic parameters are indeterminate.  2. The mitral valve is degenerative. Trivial mitral valve regurgitation.  3. The aortic valve is tricuspid. There is moderate calcification of the aortic valve. Aortic valve regurgitation is not visualized. Mild to moderate aortic valve stenosis. Aortic valve area, by VTI measures 1.51 cm. Aortic valve mean gradient measures  12.0 mmHg. Aortic valve Vmax measures 2.40 m/s. Comparison(s): Prior images reviewed side by side. Slight decrease in stroke volume index and DVI; prior study did not use contrast. FINDINGS  Left Ventricle: Left ventricular ejection fraction, by estimation, is 50 to 55%. The left ventricle has low normal function. The left ventricle has no regional wall motion abnormalities. Definity contrast agent was given IV  to delineate the left ventricular endocardial borders. There is moderate concentric left ventricular hypertrophy. Left ventricular diastolic parameters are indeterminate. Mitral Valve: The mitral valve is degenerative in appearance. Trivial mitral valve regurgitation. Tricuspid Valve: The tricuspid valve is normal in structure. Tricuspid valve regurgitation is not demonstrated. No evidence of tricuspid stenosis. Aortic Valve: The aortic valve is tricuspid. There is moderate calcification of the aortic valve. Aortic valve regurgitation is not visualized. Mild to moderate aortic stenosis is present. Aortic valve mean gradient measures 12.0 mmHg. Aortic valve peak gradient measures 23.0 mmHg. Aortic valve area, by VTI measures 1.51 cm. Additional Comments: Spectral Doppler performed. Color Doppler performed.  LEFT VENTRICLE PLAX 2D LVIDd:         5.50 cm LVIDs:         4.10 cm LV PW:         1.30 cm LV IVS:        1.50 cm LVOT diam:     2.40 cm LV SV:         66 LV SV Index:   29 LVOT Area:     4.52 cm  LV Volumes (MOD) LV vol d, MOD A2C: 159.0 ml LV vol d, MOD A4C: 129.0 ml LV vol s, MOD A2C: 65.8 ml LV vol s, MOD A4C: 77.2 ml LV SV  MOD A2C:     93.2 ml LV SV MOD A4C:     129.0 ml LV SV MOD BP:      73.0 ml IVC IVC diam: 2.00 cm LEFT ATRIUM         Index LA diam:    5.50 cm 2.45 cm/m  AORTIC VALVE AV Area (Vmax):    1.74 cm AV Area (Vmean):   1.76 cm AV Area (VTI):     1.51 cm AV Vmax:           240.00 cm/s AV Vmean:          160.000 cm/s AV VTI:            0.437 m AV Peak Grad:      23.0 mmHg AV Mean Grad:      12.0 mmHg LVOT Vmax:         92.40 cm/s LVOT Vmean:        62.400 cm/s LVOT VTI:          0.146 m LVOT/AV VTI ratio: 0.33  AORTA Ao Root diam: 3.90 cm TRICUSPID VALVE TR Peak grad:   30.5 mmHg TR Vmax:        276.00 cm/s  SHUNTS Systemic VTI:  0.15 m Systemic Diam: 2.40 cm Riley Lam MD Electronically signed by Riley Lam MD Signature Date/Time: 12/12/2023/1:19:38 PM    Final     Microbiology Recent Results (from the past 240 hours)  MRSA Next Gen by PCR, Nasal     Status: None   Collection Time: 01/04/24  1:54 PM   Specimen: Nasal Mucosa; Nasal Swab  Result Value Ref Range Status   MRSA by PCR Next Gen NOT DETECTED NOT DETECTED Final    Comment: (NOTE) The GeneXpert MRSA Assay (FDA approved for NASAL specimens only), is one component of a comprehensive MRSA colonization surveillance program. It is not intended to diagnose MRSA infection nor to guide or monitor treatment for MRSA infections. Test performance is not FDA approved in patients less than 87 years old. Performed at [facility], [address] [place], [place] [address]   Culture, blood (Routine X 2)  w Reflex to ID Panel     Status: None   Collection Time: 01-04-24  2:52 PM   Specimen: BLOOD LEFT HAND  Result Value Ref Range Status   Specimen Description BLOOD LEFT HAND  Final   Special Requests   Final    BOTTLES DRAWN AEROBIC AND ANAEROBIC Blood Culture results may not be optimal due to an inadequate volume of blood received in culture bottles   Culture   Final    NO GROWTH 5 DAYS Performed at [facility]  Lab, [address] [place], [place] [address]    Report Status 12/26/2023 FINAL  Final  Culture, blood (Routine X 2) w Reflex to ID Panel     Status: None   Collection Time: 01/04/24  3:02 PM   Specimen: BLOOD LEFT ARM  Result Value Ref Range Status   Specimen Description BLOOD LEFT ARM  Final   Special Requests   Final    BOTTLES DRAWN AEROBIC AND ANAEROBIC Blood Culture results may not be optimal due to an inadequate volume of blood received in culture bottles   Culture   Final    NO GROWTH 5 DAYS Performed at [facility], [address], [place], [place] [address]    Report Status 12/26/2023 FINAL  Final     Leslye Peer 12/31/2023, 1:16 PM

## 2024-01-14 NOTE — Progress Notes (Signed)
 Inpatient Rehabilitation Care Coordinator Discharge Note   Patient Details  Name: Julian Fowler MRN: [id number] Date of Birth: May 23, 1935   Discharge location: D/c or acute due to medical reasons  Length of Stay: 1 day  Discharge activity level:    Home/community participation: Limited  Patient response ZD:[id number] Literacy - How often do you need to have someone help you when you read instructions, pamphlets, or other written material from your doctor or pharmacy?: Never  Patient response [id number] Isolation - How often do you feel lonely or isolated from those around you?: Patient unable to respond  Services provided included: MD, RD, PT, OT, RN, TR, CM, Pharmacy, Neuropsych, SW  Financial Services:  Financial Services Utilized: Medicare    Choices offered to/list presented to:    Follow-up services arranged:              Patient response to transportation need: Is the patient able to respond to transportation needs?: Yes In the past 12 months, has lack of transportation kept you from medical appointments or from getting medications?: No In the past 12 months, has lack of transportation kept you from meetings, work, or from getting things needed for daily living?: No   Patient/Family verbalized understanding of follow-up arrangements:  Yes  Individual responsible for coordination of the follow-up plan: contact pt wife  Confirmed correct DME delivered: Gretchen Short 12/23/2023    Comments (or additional information):    Cleola Perryman A Lula Olszewski

## 2024-01-14 NOTE — Progress Notes (Signed)
   01/08/2024 1800  Spiritual Encounters  Type of Visit Initial  Care provided to: [facility] partners present during encounter Physician  Reason for visit Code  OnCall Visit Yes   Chaplain was paged to [employer]. Pt's wife, daughter and sister was at bedside. They were worried and saddened. After a while, pt passed away. Chaplain was present, supported family, and offered prayer.  M.Kubra Delano Metz Resident [phone]

## 2024-01-14 NOTE — Discharge Summary (Signed)
 Physician Discharge Summary  Patient ID: Julian Fowler MRN: [id number] DOB/AGE: [birth year] 87 y.o.  Admit date: 12/19/2023 Discharge date: 01/02/2024  Discharge Diagnoses:  Principal Problem:   Debility DVT prophylaxis Mood stabilization Left oblique fibular fracture Hypertension OSA/acute respiratory failure/interstitial lung disease/asbestosis Chronic hyponatremia Hyperlipidemia GERD History of acoustic neuroma BPH  Discharged Condition: Stable  Significant Diagnostic Studies: CT FOOT RIGHT WO CONTRAST Result Date: 12/15/2023 CLINICAL DATA:  Foot pain and swelling after recent fall. Fracture suspected. EXAM: CT OF THE RIGHT FOOT WITHOUT CONTRAST TECHNIQUE: Multidetector CT imaging of the right foot was performed according to the standard protocol. Multiplanar CT image reconstructions were also generated. RADIATION DOSE REDUCTION: This exam was performed according to the departmental dose-optimization program which includes automated exposure control, adjustment of the mA and/or kV according to patient size and/or use of iterative reconstruction technique. COMPARISON:  Radiographs 12/14/2023 and 07/17/2019. FINDINGS: Bones/Joint/Cartilage The bones are demineralized. There is no definite evidence of acute fracture or dislocation. Minimal irregularity of the base of the 5th metatarsal without definite acute fracture. Stable old healed fracture of the distal 5th metatarsal. Hammertoe deformities noted. There are scattered degenerative changes, most advanced at the 1st metatarsophalangeal joint. Mild midfoot degenerative changes are also noted. Fragmented spurring of the medial malleolus has a nonacute appearance. No significant joint effusions are identified. Ligaments Suboptimally assessed by CT. Muscles and Tendons As evaluated by CT, the ankle and foot tendons appear intact without significant tenosynovitis. Generalized atrophy of the foot musculature. Soft tissues Diffuse dorsal subcutaneous  swelling without evidence of focal fluid collection, foreign body or soft tissue emphysema. IMPRESSION: 1. No definite evidence of acute fracture or dislocation in the right foot. Minimal irregularity of the base of the 5th metatarsal without definite fracture. Correlate with point tenderness and consider radiographic follow-up. 2. Diffuse dorsal subcutaneous swelling without evidence of focal fluid collection, foreign body or soft tissue emphysema. 3. Scattered degenerative changes, most advanced at the 1st metatarsophalangeal joint. Electronically Signed   By: Carey Bullocks M.D.   On: 12/15/2023 16:09   CT ANKLE LEFT WO CONTRAST Result Date: 12/15/2023 CLINICAL DATA:  Recent fall.  Left ankle fracture. EXAM: CT OF THE LEFT ANKLE WITHOUT CONTRAST TECHNIQUE: Multidetector CT imaging of the left ankle was performed according to the standard protocol. Multiplanar CT image reconstructions were also generated. RADIATION DOSE REDUCTION: This exam was performed according to the departmental dose-optimization program which includes automated exposure control, adjustment of the mA and/or kV according to patient size and/or use of iterative reconstruction technique. COMPARISON:  Radiographs 12/12/2022. FINDINGS: Bones/Joint/Cartilage The ankle is splinted. The bones are diffusely demineralized. There is an oblique mildly displaced fracture of the distal fibular metadiaphysis which extends into the anterior aspect of the distal tibiofibular joint. There is a small avulsion fracture of the tip of the medial malleolus, best seen on the sagittal images. In addition, there is a nondisplaced intra-articular fracture of the posterior malleolus, also best seen on the sagittal images. There is no widening of the ankle mortise. The talus is located. No evidence of acute tarsal bone fracture or significant ankle joint effusion. There are mild midfoot degenerative changes. Ligaments Suboptimally assessed by CT. Muscles and Tendons  As evaluated by CT, the ankle tendons appear intact without entrapment in the fractures. Soft tissues Mild soft tissue swelling around the ankle, greatest laterally. No focal fluid collection, foreign body or soft tissue emphysema. There is some calcification within the posterior aspect of the plantar fascia. IMPRESSION: 1. Trimalleolar fracture  of the left ankle as described. The distal fibular component is mildly displaced. The tibial components are nondisplaced. 2. No widening of the ankle mortise or evidence of tarsal bone fracture. 3. The ankle tendons appear intact without entrapment in the fractures. Electronically Signed   By: Carey Bullocks M.D.   On: 12/15/2023 15:58   DG Foot 2 Views Right Result Date: 12/14/2023 CLINICAL DATA:  Right foot pain. EXAM: RIGHT FOOT - 2 VIEW COMPARISON:  07/17/2019 FINDINGS: No acute fracture. Remote healed fifth metatarsal fracture unchanged from prior. Moderate to advanced arthropathy of the first metatarsal phalangeal joint, stable from prior. Hammertoe deformity of the toes. Midfoot degenerative spurring. Plantar calcaneal spur and Achilles tendon enthesophyte. No erosions. There is dorsal soft tissue edema. IMPRESSION: 1. Dorsal soft tissue edema. 2. No acute fracture or subluxation. 3. Moderate to advanced arthropathy of the first metatarsophalangeal joint, stable from prior. 4. Hammertoe deformity of the toes. Electronically Signed   By: Narda Rutherford M.D.   On: 12/14/2023 20:15   DG Tibia/Fibula Left Result Date: 12/14/2023 CLINICAL DATA:  Closed fracture of left distal fibula.  Swelling. EXAM: LEFT TIBIA AND FIBULA - 2 VIEW COMPARISON:  Ankle radiograph yesterday FINDINGS: Mildly displaced distal fibular fracture, proximal to the ankle mortise. No significant change in alignment from yesterday's ankle exam. The proximal fibula is intact. There is no tibial fracture. Mild tibial talar degenerative spurring. Knee arthroplasty is intact. Generalized subcutaneous  edema. IMPRESSION: 1. Mildly displaced distal fibular fracture. No significant change in alignment from yesterday's ankle exam. 2. No additional fracture of the lower leg. Electronically Signed   By: Narda Rutherford M.D.   On: 12/14/2023 20:13   DG Ankle Complete Left Result Date: 12/13/2023 CLINICAL DATA:  Recent fall with left ankle pain, initial encounter EXAM: LEFT ANKLE COMPLETE - 3+ VIEW COMPARISON:  None Available. FINDINGS: Minimally displaced oblique fracture of the distal fibular metaphysis is seen. Irregularity adjacent to the medial malleolus is noted suspicious for avulsion. No other focal fracture is seen. Mild lateral soft tissue swelling is seen. Degenerative changes of the tarsal bones are noted. IMPRESSION: Oblique fibular fracture as described. Findings suspicious for avulsion from the tip of the medial malleolus. Electronically Signed   By: Alcide Clever M.D.   On: 12/13/2023 20:23   ECHOCARDIOGRAM LIMITED Result Date: 12/12/2023    ECHOCARDIOGRAM LIMITED REPORT   Patient Name:   Julian Fowler Date of Exam: 12/12/2023 Medical Rec #:  [id number]       Height:       69.0 in Accession #:    [id number]      Weight:       242.8 lb Date of Birth:  [birth year]       BSA:          2.243 m Patient Age:    88 years        BP:           160/90 mmHg Patient Gender: M               HR:           88 bpm. Exam Location:  Inpatient Procedure: Limited Echo, Cardiac Doppler, Color Doppler and Intracardiac            Opacification Agent (Both Spectral and Color Flow Doppler were            utilized during procedure). Indications:    ; 122-I22.9 Subsequent ST elevation (STEM) and non-ST elevation                 (  NSTEMI) myocardial infarction  History:        Patient has prior history of Echocardiogram examinations, most                 recent 05/20/2023. Previous Myocardial Infarction, Acute MI and                 CAD, Abnormal ECG, Aortic Valve Disease, Arrythmias:Atrial                 Fibrillation,  Signs/Symptoms:Murmur; Risk Factors:Hypertension,                 Dyslipidemia, Sleep Apnea and Current Smoker. Aortic stenosis.  Sonographer:    Sheralyn Boatman RDCS Referring Phys: 36 [facility]  Sonographer Comments: Technically difficult study due to poor echo windows. Image acquisition challenging due to patient body habitus. Limited echo to assess wall motion. IMPRESSIONS  1. Left ventricular ejection fraction, by estimation, is 50 to 55%. The left ventricle has low normal function. The left ventricle has no regional wall motion abnormalities. There is moderate concentric left ventricular hypertrophy. Left ventricular diastolic parameters are indeterminate.  2. The mitral valve is degenerative. Trivial mitral valve regurgitation.  3. The aortic valve is tricuspid. There is moderate calcification of the aortic valve. Aortic valve regurgitation is not visualized. Mild to moderate aortic valve stenosis. Aortic valve area, by VTI measures 1.51 cm. Aortic valve mean gradient measures  12.0 mmHg. Aortic valve Vmax measures 2.40 m/s. Comparison(s): Prior images reviewed side by side. Slight decrease in stroke volume index and DVI; prior study did not use contrast. FINDINGS  Left Ventricle: Left ventricular ejection fraction, by estimation, is 50 to 55%. The left ventricle has low normal function. The left ventricle has no regional wall motion abnormalities. Definity contrast agent was given IV to delineate the left ventricular endocardial borders. There is moderate concentric left ventricular hypertrophy. Left ventricular diastolic parameters are indeterminate. Mitral Valve: The mitral valve is degenerative in appearance. Trivial mitral valve regurgitation. Tricuspid Valve: The tricuspid valve is normal in structure. Tricuspid valve regurgitation is not demonstrated. No evidence of tricuspid stenosis. Aortic Valve: The aortic valve is tricuspid. There is moderate calcification of the aortic valve. Aortic valve  regurgitation is not visualized. Mild to moderate aortic stenosis is present. Aortic valve mean gradient measures 12.0 mmHg. Aortic valve peak gradient measures 23.0 mmHg. Aortic valve area, by VTI measures 1.51 cm. Additional Comments: Spectral Doppler performed. Color Doppler performed.  LEFT VENTRICLE PLAX 2D LVIDd:         5.50 cm LVIDs:         4.10 cm LV PW:         1.30 cm LV IVS:        1.50 cm LVOT diam:     2.40 cm LV SV:         66 LV SV Index:   29 LVOT Area:     4.52 cm  LV Volumes (MOD) LV vol d, MOD A2C: 159.0 ml LV vol d, MOD A4C: 129.0 ml LV vol s, MOD A2C: 65.8 ml LV vol s, MOD A4C: 77.2 ml LV SV MOD A2C:     93.2 ml LV SV MOD A4C:     129.0 ml LV SV MOD BP:      73.0 ml IVC IVC diam: 2.00 cm LEFT ATRIUM         Index LA diam:    5.50 cm 2.45 cm/m  AORTIC VALVE AV Area (Vmax):  1.74 cm AV Area (Vmean):   1.76 cm AV Area (VTI):     1.51 cm AV Vmax:           240.00 cm/s AV Vmean:          160.000 cm/s AV VTI:            0.437 m AV Peak Grad:      23.0 mmHg AV Mean Grad:      12.0 mmHg LVOT Vmax:         92.40 cm/s LVOT Vmean:        62.400 cm/s LVOT VTI:          0.146 m LVOT/AV VTI ratio: 0.33  AORTA Ao Root diam: 3.90 cm TRICUSPID VALVE TR Peak grad:   30.5 mmHg TR Vmax:        276.00 cm/s  SHUNTS Systemic VTI:  0.15 m Systemic Diam: 2.40 cm Julian Lam MD Electronically signed by Julian Lam MD Signature Date/Time: 12/12/2023/1:19:38 PM    Final     Labs:  Basic Metabolic Panel: Recent Labs  Lab 12/14/23 0436 12/15/23 0348 12/16/23 0512 12/17/23 0623 12/19/23 0442  NA 128* 128* 127* 129* 126*  K 4.3 3.7 3.7 4.0 4.2  CL 96* 94* 95* 96* 98  CO2 27 23 22 23  20*  GLUCOSE 98 95 94 88 88  BUN 7* 11 13 11 15   CREATININE 0.76 0.85 0.77 0.79 0.86  CALCIUM 8.3* 8.3* 8.4* 8.2* 7.7*  MG  --   --   --  1.5* 1.9  PHOS 3.4 3.5 3.7 3.7  --     CBC: Recent Labs  Lab 12/16/23 0512 12/17/23 0623 12/18/23 0443  WBC 13.2* 9.3 11.5*  HGB 11.6* 10.8* 11.6*  HCT  33.8* 31.3* 33.4*  MCV 88.9 87.7 87.4  PLT 310 359 382    CBG: No results for input(s): "GLUCAP" in the last 168 hours.  Family history.  Father with oral cancer mother with breast cancer.  Denies any colon cancer esophageal cancer or rectal cancer  Brief HPI:   Julian Fowler is a 89 y.o. right-handed male with history significant for chronic hyponatremia asbestosis hypertension hyperlipidemia GERD, PSVT/atrial fibrillation not on anticoagulation due to history of falls, cerebrovascular AV fistula, acoustic neuroma status post craniectomy for excision 1995 BPH asthma anxiety, ILD, bilateral total knee replacement, quit smoking 37 years ago.  Per chart review lives with spouse.  He does have a laundry in a work area in the basement.  Ambulatory with a straight point cane.  Patient initially presented to outside hospital [facility] 2/19 with severely elevated blood pressure complaints of fogginess, dizziness and lightheadedness.  He was started on treatment for hypertensive emergency including nitroglycerin infusion.  He was weaned off this to p.o. medications.  Course complicated by some agitation requiring Haldol.  Thought possibly secondary to sundowning had a negative MRI.  Urinalysis for UTI is possibly contributing factor started on ceftriaxone.  Developed significant chest pain 2/25 EKG evidence of ST depression diffusely and troponin trending was 1800, at 2200.  He was transferred to [facility] for non-STEMI evaluation and cardiology services consulted.  TTE with LVEF of 50 to 55% and no RWMA.  Cardiology services consulted recommended medical management therapy Plavix monotherapy followed by low-dose aspirin after possibly 3 months.  He has had no further bouts of chest pain or shortness of breath.  Patient's mental status continued to improve suspect acute metabolic encephalopathy superimposed on underlying question  mild dementia anxiety and UTI with completion of IV Rocephin.  He  continues to remain on Seroquel.  Orthopedic services consulted 12/14/2023 after reported fall and patient's foot got caught while working with therapy lost his balance with assisted fall to the floor denying striking his head or loss of consciousness.  Patient's x-rays revealed closed displaced left distal fibular fracture as well as CT of the left ankle 12/15/2023 did show small trimalleolar fracture in the distal fibular component mildly displaced and no surgical intervention with follow-up orthopedic services nonweightbearing left lower extremity.  Patient was transition from a cam boot to a well-padded plaster posterior short leg splint with a stirrup.  CT of the left ankle was reviewed by orthopedic services felt the fracture was in acceptable alignment in the splint and given his recent MI it was safest to manage the fracture conservatively in a splint followed by a cast.  He would remain nonweightbearing on the left.  He was weightbearing as tolerated on the right lower extremity.  No plan for surgical intervention and follow-up outpatient orthopedic services.  Patient with bouts of hyponatremia 127-131 maintained on sodium chloride tablets.  Therapy evaluations completed due to patient decreased functional mobility was admitted for a comprehensive rehab program   Hospital Course: Julian Fowler was admitted to rehab 12/19/2023 for inpatient therapies to consist of PT, ST and OT at least three hours five days a week. Past admission physiatrist, therapy team and rehab RN have worked together to provide customized collaborative inpatient rehab.  Pertaining to patient's debility/non-STEMI with conservative care complicated by left fibular fracture.  He will remain on Plavix therapy no chest pain or shortness of breath follow-up cardiology services.  Subcutaneous heparin for DVT prophylaxis.  In regards to left oblique fibular fracture he would remain nonweightbearing transition from cam boot to well-padded  plaster posterior short leg splint with in regards to left oblique fibular fracture he would remain nonweightbearing transition from cam boot to well-padded plaster posterior short leg splint with stirrup.  Neurovascular sensation intact no current plan for surgical intervention follow-up outpatient with orthopedic services to consider surgical intervention after recovery from myocardial infarction.  Pain managed with use of Voltaren gel as well as tramadol as needed.  Mood stabilization with Namenda as prior to admission as well as low-dose Seroquel delirium precautions.  History of BPH maintained on prophylactic Macrodantin with history of UTIs.  Patient would resume his Rapaflo on discharge.  Blood pressure overall controlled on present regimen he would need outpatient follow-up.  Chronic hyponatremia sodium chloride tablets as advised with maintenance of chemistries.  Pravachol ongoing for hyperlipidemia.  On the afternoon of 01/02/2024 patient developed acute dyspnea and respiratory distress.  He was noted to have some stridor.  No overt chest pain.  ECG with atrial fibrillation more tachycardic 120s but no new ST or T wave findings.  He was requiring 6 L nasal cannula.  Chest x-ray with stable changes consistent with ILD no new infiltrates.  Initial troponin elevated 1699.  Critical care medicine was consulted patient was discharged to acute care services for acute respiratory distress.   Blood pressures were monitored on TID basis and controlled and monitored     Rehab course: During patient's stay in rehab weekly team conferences were held to monitor patient's progress, set goals and discuss barriers to discharge. At admission, patient required min assist 120 feet straight cane max assist sit to stand  Physical exam.  Blood pressure 148/86 pulse 97 temperature 98  respirations 20 oxygen saturation is 96% room air Constitutional.  No acute distress HEENT Head.  Normocephalic and atraumatic Eyes.   Pupils round and reactive to light no discharge without nystagmus Neck.  Supple nontender no JVD without thyromegaly Cardiac regular rate and rhythm without any extra sounds or murmur heard Abdomen.  Soft nontender positive bowel sounds without rebound Respiratory effort normal no respiratory distress without wheeze Neurologic.  Alert and oriented to person place and time.  Speech was fluent no substitutions or neoglisms no dysarthria.  Names 3/3 objects correctly.  Manual muscle testing bilateral upper extremities 5/5 Right lower extremity 4/5 HF otherwise 5/5 Left lower extremity 5/5 except 1/5 EHL/1/5 PF  He/She  has had improvement in activity tolerance, balance, postural control as well as ability to compensate for deficits. He/She has had improvement in functional use RUE/LUE  and RLE/LLE as well as improvement in awareness.  Patient was currently crying total assist +2 for mobility secondary to muscle weakness decrease cardiorespiratory endurance.  Therapy evaluations had just been initiated.       Disposition: Discharge to acute care services    Diet: Regular  Special Instructions: No driving smoking or alcohol  Nonweightbearing left lower extremity with splint  Medications at discharge. 1.  Tylenol as needed 2.  Norvasc 10 mg p.o. daily 3.  Plavix 75 mg p.o. daily 4.  Voltaren gel 2 g 4 times daily 5.  ProAir/HFA 2 puffs every 6 hours as needed 6.  Mucinex 1200 mg p.o. twice daily 7.  Hydralazine 100 mg p.o. 3 times daily 8.  Avapro 150 mg p.o. daily 9.  Imdur 60 mg p.o. daily 10.  Ativan 0.5 mg p.o. 3 times daily as needed anxiety/sleep 11.  Nexium 40 mg twice daily 12.  Pravachol 40 mg p.o. daily 13.  Seroquel 50 mg p.o. nightly 14.  Sodium chloride tablet 1 g p.o. 3 times daily with meals 15.  Aldactone 25 mg p.o. daily 16.  Tramadol 25 mg every 12 hours as needed pain 17.  Astelin nasal spray 0.1% 1 to 2 puffs every 8 hours each nostril as needed 18.   Multivitamin daily 19.Xalatan ophthalmic solution 0.005% 1 drop both eyes bedtime 20.  Macrodantin 50 mg p.o. nightly 21.  Nitroglycerin as needed chest pain 22.  Rapaflo 8 mg p.o. nightly 23.  Symbicort 160-4.5 mcg inhaler 2 puffs twice daily 24.  Vitamin D 1 tablet daily 25.  Zinc 1 tablet daily with lunch 26.  Namenda 5 mg p.o. twice daily  30-35 minutes were spent completing discharge summary and discharge planning     Follow-up Information     Raulkar, Drema Pry, MD Follow up.   Specialty: Physical Medicine and Rehabilitation Why: No formal follow-up needed Contact information: [address] [place] [address] [phone]         Toni Arthurs, MD Follow up.   Specialty: Orthopedic Surgery Why: Call for appointment Contact information: [address] [place] [address] [phone]         Jonelle Sidle, MD Follow up.   Specialty: Cardiology Why: Call for appointment Contact information: [address] [place] [place] [address] [phone]                 Signed: Mcarthur Rossetti Brizza Nathanson 12/20/2023, 5:03 AM

## 2024-01-14 NOTE — Progress Notes (Signed)
 RT called at 1730 due to patient desaturation on 15L HFNC to the 80s. Upon entering room, bipap initiated and patient was noted to be unresponsive. Compressions started, patient manually ventilated with bag valve mask and PEEP valve until physician was able to intubate patient. Upon intubation, tube placement confirmed by ETCO2 color change detector and bilateral breath sounds. Patient placed briefly on ventilator, cpr was reinitiated and patient was then manually ventilated with bag valve until time of death was called by physician.

## 2024-01-14 NOTE — Plan of Care (Signed)
  Problem: Consults Goal: RH GENERAL PATIENT EDUCATION Description: See Patient Education module for education specifics. Outcome: Progressing   Problem: RH BOWEL ELIMINATION Goal: RH STG MANAGE BOWEL WITH ASSISTANCE Description: STG Manage Bowel with toileting Assistance. Outcome: Progressing   Problem: RH BOWEL ELIMINATION Goal: RH STG MANAGE BOWEL W/MEDICATION W/ASSISTANCE Description: STG Manage Bowel with Medication with mod I Assistance. Outcome: Progressing   Problem: RH SAFETY Goal: RH STG ADHERE TO SAFETY PRECAUTIONS W/ASSISTANCE/DEVICE Description: STG Adhere to Safety Precautions With cues Assistance/Device. Outcome: Progressing   Problem: RH KNOWLEDGE DEFICIT GENERAL Goal: RH STG INCREASE KNOWLEDGE OF SELF CARE AFTER HOSPITALIZATION Description: Patient and wife will be able to manage care at discharge using educational resources for medications and dietary modification independently Outcome: Progressing   Problem: RH KNOWLEDGE DEFICIT Goal: RH STG INCREASE KNOWLEDGE OF HYPERTENSION Description: Patient and wife will be able to manage HTN at discharge using educational resources for medications and dietary modification independently Outcome: Progressing   Problem: Education: Goal: Individualized Educational Video(s) Outcome: Progressing   Problem: Education: Goal: Understanding of medication regimen will improve Outcome: Progressing   Problem: Education: Goal: Understanding of cardiac disease, CV risk reduction, and recovery process will improve Description: Patient and wife will be able to manage care at discharge using educational resources for medications and dietary modification independently Outcome: Progressing

## 2024-01-14 NOTE — Plan of Care (Signed)
 Change in medical condition; unable to complete the CIR program

## 2024-01-14 NOTE — IPOC Note (Signed)
 Overall Plan of Care [facility]) Patient Details Name: Julian Fowler MRN: [id number] DOB: 15-Jan-1935  Admitting Diagnosis: Debility  Hospital Problems: Principal Problem:   Debility     Functional Problem List: Nursing Bowel, Pain, Safety, Endurance, Medication Management  PT Balance, Behavior, Edema, Endurance, Motor, Nutrition, Pain, Sensory, Skin Integrity  OT Balance, Cognition, Edema, Endurance, Motor, Pain, Safety  SLP    TR         Basic ADL's: OT Bathing, Dressing, Toileting     Advanced  ADL's: OT       Transfers: PT Bed Mobility, Bed to Chair, Car, Occupational psychologist, Research scientist (life sciences): PT Ambulation, Psychologist, prison and probation services, Stairs     Additional Impairments: OT None  SLP        TR      Anticipated Outcomes Item Anticipated Outcome  Self Feeding Set up A  Swallowing      Basic self-care  Supervision to min A  Toileting  Min A   Bathroom Transfers Supervision  Bowel/Bladder  manage bowel w mod I assit  Transfers  supervision with LRAD  Locomotion  CGA with LRAD  Communication     Cognition     Pain  Pain < 4 with prns  Safety/Judgment  manage safety w cues   Therapy Plan: PT Intensity: Minimum of 1-2 x/day ,45 to 90 minutes PT Frequency: 5 out of 7 days PT Duration Estimated Length of Stay: 2 weeks OT Intensity: Minimum of 1-2 x/day, 45 to 90 minutes OT Frequency: 5 out of 7 days OT Duration/Estimated Length of Stay: 2 weeks     Team Interventions: Nursing Interventions Patient/Family Education, Medication Management, Bowel Management, Disease Management/Prevention, Discharge Planning, Pain Management  PT interventions Ambulation/gait training, Discharge planning, Functional mobility training, Psychosocial support, Therapeutic Activities, Visual/perceptual remediation/compensation, Balance/vestibular training, Disease management/prevention, Neuromuscular re-education, Skin care/wound management, Therapeutic Exercise, Wheelchair  propulsion/positioning, Cognitive remediation/compensation, DME/adaptive equipment instruction, Pain management, Splinting/orthotics, UE/LE Strength taining/ROM, Community reintegration, Development worker, international aid stimulation, Patient/family education, Museum/gallery curator, UE/LE Coordination activities  OT Interventions Warden/ranger, Disease mangement/prevention, Neuromuscular re-education, Self Care/advanced ADL retraining, Therapeutic Exercise, Wheelchair propulsion/positioning, Cognitive remediation/compensation, DME/adaptive equipment instruction, Pain management, Skin care/wound managment, UE/LE Strength taining/ROM, Patient/family education, UE/LE Coordination activities, Discharge planning, Functional mobility training, Therapeutic Activities  SLP Interventions    TR Interventions    SW/CM Interventions Discharge Planning, Psychosocial Support, Patient/Family Education   Barriers to Discharge MD  Medical stability  Nursing Decreased caregiver support, Weight bearing restrictions 1 level 1 ste no rail w spouse  PT Inaccessible home environment, Home environment access/layout, Wound Care, Weight bearing restrictions, Other (comments) nausea, NWB LLE, pain, 1 STE  OT Inaccessible home environment, Home environment access/layout, Weight bearing restrictions    SLP      SW Decreased caregiver support, Lack of/limited family support     Team Discharge Planning: Destination: PT-Home ,OT- Home , SLP-  Projected Follow-up: PT-Home health PT, OT-  Home health OT, SLP-  Projected Equipment Needs: PT-To be determined, OT- To be determined, SLP-  Equipment Details: PT-has SPC and RW, OT-  Patient/family involved in discharge planning: PT- Patient, Family member/caregiver,  OT-Patient, Family member/caregiver, SLP-   MD ELOS: 18-21 days Medical Rehab Prognosis:  Excellent Assessment: The patient has been admitted for CIR therapies with the diagnosis of cardiac debility complicated by  fibular fracture. The team will be addressing functional mobility, strength, stamina, balance, safety, adaptive techniques and equipment, self-care, bowel and bladder mgt, patient and caregiver education. Goals  have been set at S. Anticipated discharge destination is home.        See Team Conference Notes for weekly updates to the plan of care

## 2024-01-14 NOTE — Progress Notes (Signed)
 Pt signed into morgue at 2045.

## 2024-01-14 DEATH — deceased

## 2024-03-05 MED FILL — Medication: Qty: 1 | Status: AC

## 2024-08-11 ENCOUNTER — Ambulatory Visit: Payer: Medicare Other | Admitting: Internal Medicine
